# Patient Record
Sex: Female | Born: 1945 | Race: White | Hispanic: No | Marital: Single | Smoking: Former smoker
Health system: Southern US, Community
[De-identification: ages and names within clinical notes are randomized; demographics above are authoritative.]

## PROBLEM LIST (undated history)

## (undated) DIAGNOSIS — Z8679 Personal history of other diseases of the circulatory system: Secondary | ICD-10-CM

## (undated) DIAGNOSIS — F419 Anxiety disorder, unspecified: Secondary | ICD-10-CM

## (undated) DIAGNOSIS — E785 Hyperlipidemia, unspecified: Secondary | ICD-10-CM

## (undated) DIAGNOSIS — F329 Major depressive disorder, single episode, unspecified: Secondary | ICD-10-CM

## (undated) DIAGNOSIS — I480 Paroxysmal atrial fibrillation: Secondary | ICD-10-CM

## (undated) DIAGNOSIS — J189 Pneumonia, unspecified organism: Secondary | ICD-10-CM

## (undated) DIAGNOSIS — F32A Depression, unspecified: Secondary | ICD-10-CM

## (undated) DIAGNOSIS — Z86718 Personal history of other venous thrombosis and embolism: Secondary | ICD-10-CM

## (undated) DIAGNOSIS — M1990 Unspecified osteoarthritis, unspecified site: Secondary | ICD-10-CM

## (undated) DIAGNOSIS — C801 Malignant (primary) neoplasm, unspecified: Secondary | ICD-10-CM

## (undated) DIAGNOSIS — I119 Hypertensive heart disease without heart failure: Secondary | ICD-10-CM

## (undated) DIAGNOSIS — H269 Unspecified cataract: Secondary | ICD-10-CM

## (undated) DIAGNOSIS — I255 Ischemic cardiomyopathy: Secondary | ICD-10-CM

## (undated) DIAGNOSIS — G43909 Migraine, unspecified, not intractable, without status migrainosus: Secondary | ICD-10-CM

## (undated) DIAGNOSIS — I219 Acute myocardial infarction, unspecified: Secondary | ICD-10-CM

## (undated) DIAGNOSIS — Z955 Presence of coronary angioplasty implant and graft: Secondary | ICD-10-CM

## (undated) DIAGNOSIS — E559 Vitamin D deficiency, unspecified: Secondary | ICD-10-CM

## (undated) DIAGNOSIS — D649 Anemia, unspecified: Secondary | ICD-10-CM

## (undated) DIAGNOSIS — M2550 Pain in unspecified joint: Secondary | ICD-10-CM

## (undated) DIAGNOSIS — T8859XA Other complications of anesthesia, initial encounter: Secondary | ICD-10-CM

## (undated) DIAGNOSIS — I2510 Atherosclerotic heart disease of native coronary artery without angina pectoris: Secondary | ICD-10-CM

## (undated) DIAGNOSIS — Z9889 Other specified postprocedural states: Secondary | ICD-10-CM

## (undated) DIAGNOSIS — G4710 Hypersomnia, unspecified: Secondary | ICD-10-CM

## (undated) DIAGNOSIS — M51369 Other intervertebral disc degeneration, lumbar region without mention of lumbar back pain or lower extremity pain: Secondary | ICD-10-CM

## (undated) DIAGNOSIS — Z85828 Personal history of other malignant neoplasm of skin: Secondary | ICD-10-CM

## (undated) DIAGNOSIS — T884XXA Failed or difficult intubation, initial encounter: Secondary | ICD-10-CM

## (undated) DIAGNOSIS — M5136 Other intervertebral disc degeneration, lumbar region: Secondary | ICD-10-CM

## (undated) DIAGNOSIS — Z95810 Presence of automatic (implantable) cardiac defibrillator: Secondary | ICD-10-CM

## (undated) DIAGNOSIS — I252 Old myocardial infarction: Secondary | ICD-10-CM

## (undated) DIAGNOSIS — M1711 Unilateral primary osteoarthritis, right knee: Secondary | ICD-10-CM

## (undated) DIAGNOSIS — Z86711 Personal history of pulmonary embolism: Secondary | ICD-10-CM

## (undated) DIAGNOSIS — M1611 Unilateral primary osteoarthritis, right hip: Secondary | ICD-10-CM

## (undated) DIAGNOSIS — G4733 Obstructive sleep apnea (adult) (pediatric): Secondary | ICD-10-CM

## (undated) DIAGNOSIS — I10 Essential (primary) hypertension: Secondary | ICD-10-CM

## (undated) DIAGNOSIS — I5022 Chronic systolic (congestive) heart failure: Secondary | ICD-10-CM

## (undated) DIAGNOSIS — Z7901 Long term (current) use of anticoagulants: Secondary | ICD-10-CM

## (undated) HISTORY — DX: Vitamin D deficiency, unspecified: E55.9

## (undated) HISTORY — DX: Unspecified cataract: H26.9

## (undated) HISTORY — DX: Hyperlipidemia, unspecified: E78.5

## (undated) HISTORY — PX: TUBAL LIGATION: SHX77

## (undated) HISTORY — DX: Pain in unspecified joint: M25.50

## (undated) HISTORY — DX: Obstructive sleep apnea (adult) (pediatric): G47.33

## (undated) HISTORY — DX: Unspecified osteoarthritis, unspecified site: M19.90

## (undated) HISTORY — PX: CATARACT EXTRACTION, BILATERAL: SHX1313

## (undated) HISTORY — DX: Ischemic cardiomyopathy: I25.5

## (undated) HISTORY — PX: WISDOM TOOTH EXTRACTION: SHX21

## (undated) HISTORY — DX: Atherosclerotic heart disease of native coronary artery without angina pectoris: I25.10

## (undated) HISTORY — DX: Hypertensive heart disease without heart failure: I11.9

## (undated) HISTORY — PX: JOINT REPLACEMENT: SHX530

## (undated) HISTORY — PX: DILATION AND CURETTAGE OF UTERUS: SHX78

## (undated) HISTORY — PX: EYE SURGERY: SHX253

---

## 2004-05-19 ENCOUNTER — Ambulatory Visit: Payer: Self-pay | Admitting: Family Medicine

## 2004-07-12 ENCOUNTER — Ambulatory Visit: Payer: Self-pay | Admitting: Family Medicine

## 2004-07-18 ENCOUNTER — Ambulatory Visit: Payer: Self-pay | Admitting: Family Medicine

## 2004-08-17 ENCOUNTER — Ambulatory Visit: Payer: Self-pay | Admitting: Family Medicine

## 2005-07-10 HISTORY — PX: CARDIAC DEFIBRILLATOR PLACEMENT: SHX171

## 2006-04-11 ENCOUNTER — Encounter: Payer: Self-pay | Admitting: Internal Medicine

## 2006-05-24 ENCOUNTER — Encounter: Payer: Self-pay | Admitting: Internal Medicine

## 2006-07-16 ENCOUNTER — Encounter: Payer: Self-pay | Admitting: Internal Medicine

## 2009-01-15 DIAGNOSIS — I219 Acute myocardial infarction, unspecified: Secondary | ICD-10-CM | POA: Insufficient documentation

## 2009-01-15 DIAGNOSIS — E785 Hyperlipidemia, unspecified: Secondary | ICD-10-CM

## 2009-01-15 DIAGNOSIS — I2589 Other forms of chronic ischemic heart disease: Secondary | ICD-10-CM

## 2009-01-15 DIAGNOSIS — I255 Ischemic cardiomyopathy: Secondary | ICD-10-CM | POA: Insufficient documentation

## 2009-01-15 DIAGNOSIS — I472 Ventricular tachycardia: Secondary | ICD-10-CM

## 2009-01-20 ENCOUNTER — Ambulatory Visit: Payer: Self-pay | Admitting: Internal Medicine

## 2009-01-20 DIAGNOSIS — Z95810 Presence of automatic (implantable) cardiac defibrillator: Secondary | ICD-10-CM

## 2009-01-29 ENCOUNTER — Telehealth (INDEPENDENT_AMBULATORY_CARE_PROVIDER_SITE_OTHER): Payer: Self-pay | Admitting: *Deleted

## 2009-07-04 ENCOUNTER — Encounter: Payer: Self-pay | Admitting: Internal Medicine

## 2009-08-31 ENCOUNTER — Encounter: Payer: Self-pay | Admitting: Internal Medicine

## 2009-08-31 ENCOUNTER — Ambulatory Visit: Payer: Self-pay | Admitting: Internal Medicine

## 2009-11-30 ENCOUNTER — Ambulatory Visit: Payer: Self-pay | Admitting: Internal Medicine

## 2009-12-13 ENCOUNTER — Encounter: Payer: Self-pay | Admitting: Internal Medicine

## 2010-04-19 ENCOUNTER — Ambulatory Visit: Payer: Self-pay | Admitting: Internal Medicine

## 2010-07-10 DIAGNOSIS — Z86711 Personal history of pulmonary embolism: Secondary | ICD-10-CM

## 2010-07-10 HISTORY — DX: Personal history of pulmonary embolism: Z86.711

## 2010-07-21 ENCOUNTER — Encounter: Payer: Self-pay | Admitting: Internal Medicine

## 2010-07-21 ENCOUNTER — Ambulatory Visit
Admission: RE | Admit: 2010-07-21 | Discharge: 2010-07-21 | Payer: Self-pay | Source: Home / Self Care | Attending: Internal Medicine | Admitting: Internal Medicine

## 2010-08-09 NOTE — Assessment & Plan Note (Signed)
Summary: medtronicq   Visit Type:  Follow-up Primary Provider:  Benedetto Goad, MD   History of Present Illness: Ms. Folden returns today for followup.  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   She does not have c/p or sob or peripheral edema.  She has had no intercurrent ICD therapies.  She is saddened by the death of her husband who died only a few months ago.  Current Medications (verified): 1)  Carvedilol 25 Mg Tabs (Carvedilol) .... Take One Tablet By Mouth Twice A Day 2)  Ramipril 2.5 Mg Caps (Ramipril) .... Take One Capsule By Mouth Daily 3)  Paxil 20 Mg Tabs (Paroxetine Hcl) .Marland Kitchen.. 1 By Mouth Once Daily 4)  Topamax 100 Mg Tabs (Topiramate) .... Two Times A Day 5)  Simvastatin 40 Mg Tabs (Simvastatin) .... One By Mouth At Bedtime 6)  Naprosyn 500 Mg Tabs (Naproxen) .Marland Kitchen.. 1 By Mouth As Needed 7)  Vitamin E 600 Unit  Caps (Vitamin E) .Marland Kitchen.. 1 By Mouth Once Daily 8)  Stress Formula  Tabs (B Complex-C-Folic Acid) .Marland Kitchen.. 1 By Mouth Once Daily 9)  Aspirin 81 Mg Tbec (Aspirin) .... Take One Tablet By Mouth Daily 10)  Zolpidem Tartrate 10 Mg Tabs (Zolpidem Tartrate) .... At Bedtime 11)  Cyanocobalamin 2500 Mcg Subl (Cyanocobalamin) .... Once Daily 12)  Diphenhydramine Hcl 25 Mg Caps (Diphenhydramine Hcl) .... Once Daily 13)  Flax   Oil (Flaxseed (Linseed)) .... 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    Vital Signs:  Patient profile:   65 year old female Height:      67 inches Weight:      181 pounds Pulse rate:   70 / minute BP sitting:   100 / 70  (left arm)  Vitals Entered By: Laurance Flatten CMA (April 19, 2010  3:11 PM)  Physical Exam  General:  Well developed, well nourished, in no acute distress. Head:  normocephalic and atraumatic Eyes:  PERRLA/EOM intact; conjunctiva and lids normal. Mouth:  Teeth, gums and palate normal. Oral mucosa normal. Neck:  Neck supple, no JVD. No masses, thyromegaly or abnormal cervical nodes. Chest Wall:  Well healed ICD incision. Lungs:  Clear bilaterally with no wheezes, rales, or rhonchi. Heart:  RRR with normal S1 and S2.  PMI is not appreciably enlarged or laterally displaced.  No murmurs, rubs, or gallops. Abdomen:  Bowel sounds positive; abdomen soft and non-tender without masses, organomegaly, or hernias noted. No hepatosplenomegaly. Msk:  Back normal, normal gait. Muscle strength and tone normal. Pulses:  pulses normal in all 4 extremities Extremities:  No clubbing or cyanosis. Neurologic:  Alert and oriented x 3.    ICD Specifications Following MD:  Lewayne Bunting, MD     ICD Vendor:  Medtronic     ICD Model Number:  [id number]     ICD Serial Number:  [id number] H ICD DOI:  04/11/2006      Lead 1:    Location: RA     DOI: 04/11/2006     Model #: [id number]     Serial #: [id number]     Status: active Lead 2:    Location: RV     DOI: 04/27/2006  Model #: [id number]     Serial #: [id number]     Status: active  Indications::  VT Not implanted by [place]   ICD Follow Up Battery Voltage:  3.04 V     Charge Time:  9.7 seconds     Underlying rhythm:  SR ICD Dependent:  No       ICD Device Measurements Atrium:  Amplitude: 2.8 mV, Impedance: 624 ohms, Threshold: 1.0 V at 0.4 msec Right Ventricle:  Amplitude: 10.4 mV, Impedance: 368 ohms, Threshold: 1.0 V at 0.4 msec Shock Impedance: 56/70 ohms   Episodes MS Episodes:  0     Coumadin:  No Shock:  0     ATP:  0     Nonsustained:  0     Atrial Therapies:  0 Atrial Pacing:  3.4%     Ventricular Pacing:  <0.2%  Brady Parameters Mode MVP     Lower Rate Limit:  60     Upper Rate Limit 120 PAV 180     Sensed AV Delay:   150  Tachy Zones VF:  188     VT:  162     Next Remote Date:  07/21/2010     Next Cardiology Appt Due:  04/10/2011 Tech Comments:  OPTIVOL INCREASE FROM 7-26 THRU 8-02.  NO EPISODES SINCE LAST CHECK.  NORMAL DEVICE FUNCTION.  CHANGED MAX LEAD IMPEDANCES FOR LIA AND CHANGED RV OUTPUT FROM 2.0 TO 2.5 V. CARELINK TRANSMISSION 07-21-10 AND ROV IN 12 MTHS W/GT. Vella Kohler  April 19, 2010 3:24 PM MD Comments:  Agree with above.  Impression & Recommendations:  Problem # 1:  AUTOMATIC IMPLANTABLE CARDIAC DEFIBRILLATOR SITU (ICD-V45.02) Her device is working normally.  Will recheck in several months.  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  She will continue her meds as below and maintain a low sodium diet and regular daily exercise. Her updated medication list for this problem includes:    Carvedilol 25 Mg Tabs (Carvedilol) .Marland Kitchen... Take one tablet by mouth twice a day    Ramipril 2.5 Mg Caps (Ramipril) .Marland Kitchen... Take one capsule by mouth daily    Aspirin 81 Mg Tbec (Aspirin) .Marland Kitchen... Take one tablet by mouth daily  Problem # 3:  DYSLIPIDEMIA (ICD-272.4) She will continue her statin and maintain a low sodium diet. Her updated medication list for this problem includes:    Simvastatin 40 Mg Tabs (Simvastatin) ..... One by mouth at bedtime  Patient Instructions: 1)  Your physician wants you to follow-up in: 12 months with Dr Court Joy will receive a reminder letter in the mail two months in advance. If you don't receive a letter, please call our office to schedule the follow-up appointment. 2)  [facility] 07/21/10

## 2010-08-09 NOTE — Letter (Signed)
Summary: Remote Device Check  [facility], Main Office  [address]   [place], [place] [address]   Phone: [phone]  Fax: [phone]     December 13, 2009 MRN: [id number]   Rachel Bates [address] [place], [place]  [address]   Dear [name],   Your remote transmission was recieved and reviewed by your physician.  All diagnostics were within normal limits for you.   __X____Your next office visit is scheduled for:  August 2011. Please call our office to schedule an appointment.    Sincerely,  Vella Kohler

## 2010-08-09 NOTE — Cardiovascular Report (Signed)
Summary: Office Visit   Office Visit   Imported By: Roderic Ovens 04/22/2010 11:42:45  _____________________________________________________________________  External Attachment:    Type:   Image     Comment:   External Document

## 2010-08-09 NOTE — Assessment & Plan Note (Signed)
Summary: [facility]   Visit Type:  Follow-up Primary Provider:  Benedetto Goad, MD   History of Present Illness: Rachel Bates returns today for followup.  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  She was doing well and it was my sense that ICD implant was not indicated as she had been referred for consideration for prophylactic implant.  She moved back to [place] and ultimately underwent insertion of a medtronic device.  She has subsequently moved back to [place] and is here today for ICD followup.  She does not have c/p or sob or peripheral edema.  She has had no intercurrent ICD therapies.  She is saddened by the death of her husband who died only a few days ago.  Current Medications (verified): 1)  Carvedilol 25 Mg Tabs (Carvedilol) .... Take One Tablet By Mouth Twice A Day 2)  Ramipril 2.5 Mg Caps (Ramipril) .... Take One Capsule By Mouth Daily 3)  Paxil 20 Mg Tabs (Paroxetine Hcl) .Marland Kitchen.. 1 By Mouth Once Daily 4)  Topamax 100 Mg Tabs (Topiramate) .... Two Times A Day 5)  Lipitor 20 Mg Tabs (Atorvastatin Calcium) .... Take One Tablet By Mouth Daily. 6)  Naprosyn 500 Mg Tabs (Naproxen) .Marland Kitchen.. 1 By Mouth As Needed 7)  Calcium Carbonate   Powd (Calcium Carbonate) .Marland Kitchen.. 1 By Mouth Two Times A Day 8)  Vitamin E 600 Unit  Caps (Vitamin E) .Marland Kitchen.. 1 By Mouth Once Daily 9)  Vitamin D 400 Unit  Tabs (Cholecalciferol) .Marland Kitchen.. 1 By Mouth Once Daily 10)  Stress Formula  Tabs (B Complex-C-Folic Acid) .Marland Kitchen.. 1 By Mouth Once Daily 11)  Aspirin 81 Mg Tbec (Aspirin) .... 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      Vital Signs:  Patient profile:   65 year old female Height:      67 inches Weight:      184 pounds BMI:     28.92 Pulse rate:   65 / minute BP sitting:   132 / 82  (left arm)  Vitals Entered By: Laurance Flatten CMA (August 31, 2009 12:43 PM)  Physical Exam  General:  Well developed, well nourished, in no acute distress. Head:  normocephalic and atraumatic Eyes:  PERRLA/EOM intact; conjunctiva and lids normal. Mouth:  Teeth, gums and palate normal. Oral mucosa normal. Neck:  Neck supple, no JVD. No masses, thyromegaly or abnormal cervical nodes. Chest Wall:  Well healed ICD incision. Lungs:  Clear bilaterally with no wheezes, rales, or rhonchi. Heart:  RRR with normal S1 and S2.  PMI is not appreciably enlarged or laterally displaced.  No murmurs, rubs, or gallops. Abdomen:  Bowel sounds positive; abdomen soft and non-tender without masses, organomegaly, or hernias noted. No hepatosplenomegaly. Msk:  Back normal, normal gait. Muscle strength and tone normal. Pulses:  pulses normal in all 4 extremities Extremities:  No clubbing or cyanosis. Neurologic:  Alert and oriented x 3.    ICD Specifications Following MD:  Lewayne Bunting, MD     ICD Vendor:  Medtronic     ICD Model Number:  [id number]     ICD Serial Number:  [id number] H ICD DOI:  04/11/2006  Lead 1:    Location: RA     DOI: 04/11/2006     Model #: 2725     Serial #: [id number]     Status: active Lead 2:    Location: RV     DOI: 04/27/2006     Model #: [id number]     Serial #: [id number]     Status: active  Indications::  VT Not implanted by [place]   ICD Follow Up Remote Check?  No Battery Voltage:  3.09 V     Charge Time:  9.6 seconds     ICD Dependent:  No       ICD Device Measurements Atrium:  Amplitude: 3.2 mV, Impedance: 640 ohms, Threshold: 1.0 V at 0.4 msec Right Ventricle:  Amplitude: 8.7 mV, Impedance: 376 ohms, Threshold: 1.0 V at 0.4 msec Shock Impedance: 51/63 ohms   Episodes MS Episodes:  0     Percent Mode  Switch:  0     Coumadin:  No Shock:  0     ATP:  0     Nonsustained:  0     Atrial Pacing:  4.4%     Ventricular Pacing:  0%  Brady Parameters Mode DDDR+     Lower Rate Limit:  60     Upper Rate Limit 120 PAV 180     Sensed AV Delay:  150  Tachy Zones VF:  188     VT:  162     Next Remote Date:  11/30/2009     Next Cardiology Appt Due:  08/10/2010 Tech Comments:  No parameter changes.  Checked by [employer].   [facility] transmissions every 3 months .  ROV 1 year Dr. Ladona Ridgel. Altha Harm, LPN  August 31, 2009 12:52 PM  MD Comments:  Agree with above.  Impression & Recommendations:  Problem # 1:  AUTOMATIC IMPLANTABLE CARDIAC DEFIBRILLATOR SITU (ICD-V45.02) The device is working normally.  Will recheck in several months.  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  Continue current meds. Her updated medication list for this problem includes:    Carvedilol 25 Mg Tabs (Carvedilol) .Marland Kitchen... Take one tablet by mouth twice a day    Ramipril 2.5 Mg Caps (Ramipril) .Marland Kitchen... Take one capsule by mouth daily    Aspirin 81 Mg Tbec (Aspirin) .Marland Kitchen... Take one tablet by mouth daily  Problem # 3:  DYSLIPIDEMIA (ICD-272.4) A low fat diet is recommended. Her updated medication list for this problem includes:    Simvastatin 40 Mg Tabs (Simvastatin) ..... One by mouth at bedtime  Patient Instructions: 1)  Your physician recommends that you schedule a follow-up appointment in: 6 months with Dr Ladona Ridgel 2)  Your physician has recommended you make the following change in your medication: change cholesterol pill to Simvastin 40mg  daily Prescriptions: SIMVASTATIN 40 MG TABS (SIMVASTATIN) one by mouth at bedtime  #90 x 3   Entered by:   Dennis Bast, RN, BSN   Authorized by:   Laren Boom, MD, [facility]   Signed by:   Dennis Bast, RN, BSN on 08/31/2009   Method used:   Electronically to        [facility]  [address] [phone]* (retail)       [address]       [place], [place]  [address]       Ph: [phone] or [phone]       Fax: [phone]  RxID:   [id number]

## 2010-08-09 NOTE — Assessment & Plan Note (Signed)
Summary: MEDTRONIC   Allergies: No Known Drug Allergies    ICD Specifications Following MD:  Lewayne Bunting, MD     ICD Vendor:  Medtronic     ICD Model Number:  [id number]     ICD Serial Number:  [id number] H ICD DOI:  04/11/2006      Lead 1:    Location: RA     DOI: 04/11/2006     Model #: 5076     Serial #: [id number]     Status: active Lead 2:    Location: RV     DOI: 04/27/2006     Model #: 6295     Serial #: [id number]     Status: active  Indications::  VT Not implanted by [place]   ICD Follow Up ICD Dependent:  No      Episodes Coumadin:  No  Brady Parameters Mode DDDR+     Lower Rate Limit:  60     Upper Rate Limit 120 PAV 180     Sensed AV Delay:  150  Tachy Zones VF:  188     VT:  162     Patient Instructions: 1)  Your physician recommends that you schedule a follow-up appointment in: 6 months with Dr Ladona Ridgel 2)  Your physician has recommended you make the following change in your medication: change cholesterol pill to Simvastin

## 2010-08-09 NOTE — Cardiovascular Report (Signed)
Summary: Office Visit Remote   Office Visit Remote   Imported By: Roderic Ovens 12/23/2009 16:12:01  _____________________________________________________________________  External Attachment:    Type:   Image     Comment:   External Document

## 2010-08-21 ENCOUNTER — Encounter (INDEPENDENT_AMBULATORY_CARE_PROVIDER_SITE_OTHER): Payer: Self-pay | Admitting: *Deleted

## 2010-08-31 NOTE — Letter (Signed)
Summary: Remote Device Check  [facility], Main Office  [address]   [place], [place] [address]   Phone: [phone]  Fax: [phone]     August 21, 2010 MRN: [id number]   JAYNA MULNIX [address] [place], [place]  [address]   Dear Ms. Delprado,   Your remote transmission was recieved and reviewed by your physician.  All diagnostics were within normal limits for you.  __X___Your next transmission is scheduled for:  10-20-2010.  Please transmit at any time this day.  If you have a wireless device your transmission will be sent automatically.  Sincerely,  Vella Kohler

## 2010-08-31 NOTE — Cardiovascular Report (Signed)
Summary: Office Visit Remote   Office Visit Remote   Imported By: Roderic Ovens 08/23/2010 15:10:14  _____________________________________________________________________  External Attachment:    Type:   Image     Comment:   External Document

## 2010-10-20 ENCOUNTER — Encounter: Payer: Self-pay | Admitting: *Deleted

## 2010-10-25 ENCOUNTER — Encounter: Payer: Self-pay | Admitting: *Deleted

## 2010-12-29 ENCOUNTER — Encounter: Payer: Self-pay | Admitting: Internal Medicine

## 2010-12-30 ENCOUNTER — Ambulatory Visit (INDEPENDENT_AMBULATORY_CARE_PROVIDER_SITE_OTHER): Payer: Medicare Other | Admitting: Internal Medicine

## 2010-12-30 ENCOUNTER — Encounter: Payer: Self-pay | Admitting: Internal Medicine

## 2010-12-30 VITALS — BP 105/67 | HR 76 | Resp 18 | Ht 67.0 in | Wt 181.8 lb

## 2010-12-30 DIAGNOSIS — I2589 Other forms of chronic ischemic heart disease: Secondary | ICD-10-CM

## 2010-12-30 DIAGNOSIS — I5022 Chronic systolic (congestive) heart failure: Secondary | ICD-10-CM

## 2010-12-30 DIAGNOSIS — Z95810 Presence of automatic (implantable) cardiac defibrillator: Secondary | ICD-10-CM

## 2010-12-30 DIAGNOSIS — I2699 Other pulmonary embolism without acute cor pulmonale: Secondary | ICD-10-CM | POA: Insufficient documentation

## 2010-12-30 DIAGNOSIS — I472 Ventricular tachycardia: Secondary | ICD-10-CM

## 2010-12-30 NOTE — Assessment & Plan Note (Signed)
Her device is working normally. Will recheck in several months. 

## 2010-12-30 NOTE — Assessment & Plan Note (Signed)
Her symptoms have resolved. She will continue on coumadin for several months.

## 2010-12-30 NOTE — Assessment & Plan Note (Signed)
She denies anginal symptoms. She will continue her current meds.  

## 2010-12-30 NOTE — Patient Instructions (Addendum)
Your physician recommends that you schedule a follow-up appointment in: 3 months with Dr Taylor  

## 2010-12-30 NOTE — Progress Notes (Signed)
HPI Rachel Bates returns today for followup. She is a pleasant middle aged woman with a h/o CAD, CHF, HTN, s/p ICD. She has recently been diagnosed with a pulmonary embolism and is on coumadin. She denies c/p, sob or peripheral edema at least since she was discharged from the hospital with her PE.  No Known Allergies   Current Outpatient Prescriptions  Medication Sig Dispense Refill  . aspirin 81 MG tablet Take 81 mg by mouth daily.        . B Complex-C-Folic Acid (STRESS FORMULA) TABS Take 1 tablet by mouth daily.        . carvedilol (COREG) 25 MG tablet Take 25 mg by mouth 2 (two) times daily with a meal.        . Cyanocobalamin 2500 MCG TABS Take 1 tablet by mouth daily.        . Omega-3 Fatty Acids (FISH OIL) 1000 MG CAPS Take 1 capsule by mouth daily.        Marland Kitchen PARoxetine (PAXIL) 20 MG tablet Take 40 mg by mouth every morning.       . ramipril (ALTACE) 2.5 MG tablet Take 2.5 mg by mouth daily.        . simvastatin (ZOCOR) 40 MG tablet Take 40 mg by mouth at bedtime.        . topiramate (TOPAMAX) 100 MG tablet Take 100 mg by mouth 2 (two) times daily.        . vitamin E 600 UNIT capsule Take 600 Units by mouth daily.        Marland Kitchen warfarin (COUMADIN) 7.5 MG tablet Take 7.5 mg by mouth daily. AS DIRECTED       . zolpidem (AMBIEN) 10 MG tablet Take 10 mg by mouth at bedtime as needed.        Marland Kitchen DISCONTD: diphenhydrAMINE (SOMINEX) 25 MG tablet Take 25 mg by mouth daily.        Marland Kitchen DISCONTD: Judie Petit Prim-Borage (FLAX OIL XTRA) CAPS Take 1 capsule by mouth daily.        Marland Kitchen DISCONTD: naproxen (NAPROSYN) 500 MG tablet Take 500 mg by mouth daily as needed.           Past Medical History  Diagnosis Date  . Dyslipidemia   . Ventricular tachycardia   . Myocardial infarct   . Ischemic cardiomyopathy     ROS:   All systems reviewed and negative except as noted in the HPI.   No past surgical history on file.   Family History  Problem Relation Age of Onset  . Hypertension Mother   .  Thyroid disease Mother   . Coronary artery disease Father      History   Social History  . Marital Status: Married    Spouse Name: N/A    Number of Children: N/A  . Years of Education: N/A   Occupational History  . Not on file.   Social History Main Topics  . Smoking status: Former Games developer  . Smokeless tobacco: Not on file  . Alcohol Use: No  . Drug Use: No  . Sexually Active: Not on file   Other Topics Concern  . Not on file   Social History Narrative  . No narrative on file     BP 105/67  Pulse 76  Resp 18  Ht 5\' 7"  (1.702 m)  Wt 181 lb 12.8 oz (82.464 kg)  BMI 28.47 kg/m2  Physical Exam:  Well appearing NAD HEENT: Unremarkable Neck:  No JVD,  no thyromegally Lymphatics:  No adenopathy Back:  No CVA tenderness Lungs:  Clear with no wheezes HEART:  Regular rate rhythm, no murmurs, no rubs, no clicks Abd:  Flat, positive bowel sounds, no organomegally, no rebound, no guarding Ext:  2 plus pulses, trace edema, no cyanosis, no clubbing Skin:  No rashes no nodules Neuro:  CN II through XII intact, motor grossly intact  DEVICE  Normal device function.  See PaceArt for details.   Assess/Plan:

## 2011-01-02 ENCOUNTER — Telehealth: Payer: Self-pay | Admitting: Internal Medicine

## 2011-01-02 NOTE — Telephone Encounter (Signed)
Called and left message for patient to call me back.

## 2011-01-02 NOTE — Telephone Encounter (Signed)
Pt was here Friday and was to get a Rx called in for lasix and it was not sent pls call into [facility] in [place] [phone]

## 2011-01-03 MED ORDER — FUROSEMIDE 20 MG PO TABS: 20.0000 mg | ORAL_TABLET | Freq: Every day | ORAL | Status: DC

## 2011-01-03 NOTE — Telephone Encounter (Signed)
lmom for pt that Dr Ladona Ridgel said ok for Lasix 20mg  daily

## 2011-03-30 ENCOUNTER — Ambulatory Visit (INDEPENDENT_AMBULATORY_CARE_PROVIDER_SITE_OTHER): Payer: Medicare Other | Admitting: Internal Medicine

## 2011-03-30 ENCOUNTER — Encounter: Payer: Self-pay | Admitting: Internal Medicine

## 2011-03-30 DIAGNOSIS — Z95810 Presence of automatic (implantable) cardiac defibrillator: Secondary | ICD-10-CM

## 2011-03-30 DIAGNOSIS — I2589 Other forms of chronic ischemic heart disease: Secondary | ICD-10-CM

## 2011-03-30 DIAGNOSIS — I2699 Other pulmonary embolism without acute cor pulmonale: Secondary | ICD-10-CM

## 2011-03-30 DIAGNOSIS — I509 Heart failure, unspecified: Secondary | ICD-10-CM

## 2011-03-30 DIAGNOSIS — I472 Ventricular tachycardia, unspecified: Secondary | ICD-10-CM

## 2011-03-30 LAB — ICD DEVICE OBSERVATION
AL AMPLITUDE: 2.7 mv
ATRIAL PACING ICD: 2.61 pct
BAMS-0001: 165 {beats}/min
DEV-0020ICD: NEGATIVE
FVT: 0
RV LEAD THRESHOLD: 1 V
TZAT-0001ATACH: 2
TZAT-0001ATACH: 3
TZAT-0002ATACH: NEGATIVE
TZAT-0002ATACH: NEGATIVE
TZAT-0002ATACH: NEGATIVE
TZAT-0004SLOWVT: 8
TZAT-0005SLOWVT: 81 pct
TZAT-0011SLOWVT: 10 ms
TZAT-0012ATACH: 150 ms
TZAT-0012FASTVT: 200 ms
TZAT-0012SLOWVT: 200 ms
TZAT-0018ATACH: NEGATIVE
TZAT-0019ATACH: 6 V
TZAT-0019ATACH: 6 V
TZAT-0019ATACH: 6 V
TZAT-0020FASTVT: 1.5 ms
TZON-0003SLOWVT: 370 ms
TZON-0004VSLOWVT: 20
TZON-0008SLOWVT: 180000 ms
TZON-0008VSLOWVT: 180000 ms
TZON-0010FASTVT: 50 ms
TZST-0001ATACH: 5
TZST-0001ATACH: 6
TZST-0001FASTVT: 2
TZST-0001FASTVT: 6
TZST-0001SLOWVT: 3
TZST-0002ATACH: NEGATIVE
TZST-0002ATACH: NEGATIVE
TZST-0002ATACH: NEGATIVE
TZST-0002FASTVT: NEGATIVE
TZST-0002FASTVT: NEGATIVE
TZST-0002FASTVT: NEGATIVE
TZST-0003SLOWVT: 20 J
TZST-0003SLOWVT: 35 J
TZST-0003SLOWVT: 35 J
VENTRICULAR PACING ICD: 0.03 pct
VF: 0

## 2011-03-30 NOTE — Assessment & Plan Note (Signed)
She remains active. She denies anginal symptoms. She will continue her current medical therapy.

## 2011-03-30 NOTE — Progress Notes (Signed)
HPI Rachel Bates returns today for followup. She is a 64 year old woman with a history of ischemic cardiomyopathy and chronic class II congestive heart failure. She has a history of pulmonary embolism and has been on warfarin therapy. She is status post prophylactic ICD implantation. She denies any ICD shocks. She is scheduled to undergo extraction of her upper teeth. No other complaints today.  No Known Allergies   Current Outpatient Prescriptions  Medication Sig Dispense Refill  . aspirin 81 MG tablet Take 81 mg by mouth daily.        . B Complex-C-Folic Acid (STRESS FORMULA) TABS Take 1 tablet by mouth daily.        . carvedilol (COREG) 25 MG tablet Take 25 mg by mouth 2 (two) times daily with a meal.        . cholecalciferol (VITAMIN D) 1000 UNITS tablet Take 1,000 Units by mouth daily.        . Cyanocobalamin 2500 MCG TABS Take 1 tablet by mouth daily.        . furosemide (LASIX) 20 MG tablet Take 20 mg by mouth as needed.        . methocarbamol (ROBAXIN) 750 MG tablet Take 750 mg by mouth as needed.        . Omega-3 Fatty Acids (FISH OIL) 1000 MG CAPS Take 1 capsule by mouth daily.        Marland Kitchen PARoxetine (PAXIL) 40 MG tablet Take 40 mg by mouth every morning.        . ramipril (ALTACE) 2.5 MG tablet Take 2.5 mg by mouth daily.        . simvastatin (ZOCOR) 40 MG tablet Take 40 mg by mouth at bedtime.        . topiramate (TOPAMAX) 100 MG tablet Take 100 mg by mouth 2 (two) times daily.        . traMADol (ULTRAM) 50 MG tablet Take 50 mg by mouth every 6 (six) hours as needed.        . vitamin E 600 UNIT capsule Take 600 Units by mouth daily.        Marland Kitchen warfarin (COUMADIN) 7.5 MG tablet Take 7.5 mg by mouth daily. AS DIRECTED       . zolpidem (AMBIEN) 10 MG tablet Take 10 mg by mouth at bedtime as needed.           Past Medical History  Diagnosis Date  . Dyslipidemia   . Ventricular tachycardia   . Myocardial infarct   . Ischemic cardiomyopathy     ROS:   All systems reviewed and  negative except as noted in the HPI.   No past surgical history on file.   Family History  Problem Relation Age of Onset  . Hypertension Mother   . Thyroid disease Mother   . Coronary artery disease Father      History   Social History  . Marital Status: Married    Spouse Name: N/A    Number of Children: N/A  . Years of Education: N/A   Occupational History  . Not on file.   Social History Main Topics  . Smoking status: Former Games developer  . Smokeless tobacco: Not on file  . Alcohol Use: No  . Drug Use: No  . Sexually Active: Not on file   Other Topics Concern  . Not on file   Social History Narrative  . No narrative on file     BP 104/60  Pulse 73  Ht  5\' 7"  (1.702 m)  Wt 186 lb (84.369 kg)  BMI 29.13 kg/m2  Physical Exam:  Well appearing NAD HEENT: Unremarkable Neck:  No JVD, no thyromegally Lymphatics:  No adenopathy Back:  No CVA tenderness Lungs:  Clear with no wheezes, rales, or rhonchi. HEART:  Regular rate rhythm, no murmurs, no rubs, no clicks Abd:  soft, positive bowel sounds, no organomegally, no rebound, no guarding Ext:  2 plus pulses, no edema, no cyanosis, no clubbing Skin:  No rashes no nodules Neuro:  CN II through XII intact, motor grossly intact  DEVICE  Normal device function.  See PaceArt for details.   Assess/Plan:

## 2011-03-30 NOTE — Patient Instructions (Signed)
Your physician wants you to follow-up in: 12 months with Dr Taylor You will receive a reminder letter in the mail two months in advance. If you don't receive a letter, please call our office to schedule the follow-up appointment.  Remote monitoring is used to monitor your Pacemaker of ICD from home. This monitoring reduces the number of office visits required to check your device to one time per year. It allows us to keep an eye on the functioning of your device to ensure it is working properly. You are scheduled for a device check from home on 06/29/11. You may send your transmission at any time that day. If you have a wireless device, the transmission will be sent automatically. After your physician reviews your transmission, you will receive a postcard with your next transmission date.    

## 2011-03-30 NOTE — Assessment & Plan Note (Signed)
Her device is working normally. Will recheck in several months. 

## 2011-03-30 NOTE — Assessment & Plan Note (Signed)
She Will continue Coumadin for now. The patient is pending dental work in several weeks. I recommended that she stop her Coumadin for 3-5 days prior to her dental procedure and then started back the day after the procedure. Overall she will likely require 6 total months of Coumadin therapy which would require her to take Coumadin through November.

## 2011-06-29 ENCOUNTER — Encounter: Payer: PRIVATE HEALTH INSURANCE | Admitting: *Deleted

## 2011-07-05 ENCOUNTER — Encounter: Payer: Self-pay | Admitting: Internal Medicine

## 2011-07-05 ENCOUNTER — Other Ambulatory Visit: Payer: Self-pay | Admitting: Internal Medicine

## 2011-07-05 ENCOUNTER — Ambulatory Visit (INDEPENDENT_AMBULATORY_CARE_PROVIDER_SITE_OTHER): Payer: Medicare Other | Admitting: *Deleted

## 2011-07-05 DIAGNOSIS — Z95810 Presence of automatic (implantable) cardiac defibrillator: Secondary | ICD-10-CM

## 2011-07-05 DIAGNOSIS — I472 Ventricular tachycardia: Secondary | ICD-10-CM

## 2011-07-07 LAB — REMOTE ICD DEVICE
AL AMPLITUDE: 3.2 mv
AL IMPEDENCE ICD: 528 Ohm
BAMS-0001: 165 {beats}/min
BATTERY VOLTAGE: 2.96 V
CHARGE TIME: 10.159 s
DEV-0020ICD: NEGATIVE
RV LEAD AMPLITUDE: 8.7 mv
RV LEAD IMPEDENCE ICD: 376 Ohm
TOT-0001: 10
TOT-0002: 0
TOT-0006: 20071019000000
TZAT-0001ATACH: 1
TZAT-0002ATACH: NEGATIVE
TZAT-0002ATACH: NEGATIVE
TZAT-0002ATACH: NEGATIVE
TZAT-0004SLOWVT: 8
TZAT-0011SLOWVT: 10 ms
TZAT-0012ATACH: 150 ms
TZAT-0012FASTVT: 200 ms
TZAT-0018ATACH: NEGATIVE
TZAT-0018ATACH: NEGATIVE
TZAT-0018FASTVT: NEGATIVE
TZAT-0019ATACH: 6 V
TZAT-0019ATACH: 6 V
TZAT-0019SLOWVT: 8 V
TZAT-0020ATACH: 1.5 ms
TZAT-0020FASTVT: 1.5 ms
TZAT-0020SLOWVT: 1.5 ms
TZON-0003SLOWVT: 370 ms
TZON-0008SLOWVT: 180000 ms
TZON-0008VSLOWVT: 180000 ms
TZON-0010FASTVT: 50 ms
TZON-0010SLOWVT: 50 ms
TZST-0001ATACH: 5
TZST-0001FASTVT: 2
TZST-0001FASTVT: 5
TZST-0001FASTVT: 6
TZST-0001SLOWVT: 4
TZST-0001SLOWVT: 6
TZST-0002ATACH: NEGATIVE
TZST-0002ATACH: NEGATIVE
TZST-0002FASTVT: NEGATIVE
TZST-0002FASTVT: NEGATIVE
TZST-0003SLOWVT: 20 J
TZST-0003SLOWVT: 35 J
TZST-0003SLOWVT: 35 J

## 2011-07-13 NOTE — Progress Notes (Signed)
Remote icd check  

## 2011-07-26 ENCOUNTER — Encounter: Payer: Self-pay | Admitting: *Deleted

## 2011-10-05 ENCOUNTER — Ambulatory Visit (INDEPENDENT_AMBULATORY_CARE_PROVIDER_SITE_OTHER): Payer: Medicare Other | Admitting: *Deleted

## 2011-10-05 DIAGNOSIS — I472 Ventricular tachycardia, unspecified: Secondary | ICD-10-CM

## 2011-10-05 DIAGNOSIS — I509 Heart failure, unspecified: Secondary | ICD-10-CM

## 2011-10-05 DIAGNOSIS — Z95810 Presence of automatic (implantable) cardiac defibrillator: Secondary | ICD-10-CM

## 2011-10-09 ENCOUNTER — Encounter: Payer: Self-pay | Admitting: Internal Medicine

## 2011-10-10 LAB — REMOTE ICD DEVICE
CHARGE TIME: 10.159 s
DEV-0020ICD: NEGATIVE
PACEART VT: 0
TOT-0001: 10
TOT-0002: 0
TZAT-0001ATACH: 1
TZAT-0001ATACH: 2
TZAT-0001FASTVT: 1
TZAT-0002ATACH: NEGATIVE
TZAT-0002ATACH: NEGATIVE
TZAT-0012ATACH: 150 ms
TZAT-0012FASTVT: 200 ms
TZAT-0012SLOWVT: 200 ms
TZAT-0018ATACH: NEGATIVE
TZAT-0018ATACH: NEGATIVE
TZAT-0019ATACH: 6 V
TZAT-0019ATACH: 6 V
TZAT-0019SLOWVT: 8 V
TZAT-0020ATACH: 1.5 ms
TZAT-0020SLOWVT: 1.5 ms
TZON-0003ATACH: 370 ms
TZON-0003SLOWVT: 370 ms
TZON-0008SLOWVT: 180000 ms
TZON-0008VSLOWVT: 180000 ms
TZON-0010FASTVT: 50 ms
TZON-0010SLOWVT: 50 ms
TZST-0001ATACH: 5
TZST-0001FASTVT: 4
TZST-0001FASTVT: 5
TZST-0001SLOWVT: 4
TZST-0001SLOWVT: 6
TZST-0002ATACH: NEGATIVE
TZST-0002FASTVT: NEGATIVE
TZST-0002FASTVT: NEGATIVE
TZST-0003SLOWVT: 20 J
TZST-0003SLOWVT: 35 J
VENTRICULAR PACING ICD: 0.01 pct

## 2011-10-11 NOTE — Progress Notes (Signed)
Remote icd check w/icm  

## 2011-10-18 ENCOUNTER — Encounter: Payer: Self-pay | Admitting: *Deleted

## 2012-01-11 ENCOUNTER — Encounter: Payer: Self-pay | Admitting: Internal Medicine

## 2012-01-12 ENCOUNTER — Ambulatory Visit (INDEPENDENT_AMBULATORY_CARE_PROVIDER_SITE_OTHER): Payer: Medicare Other | Admitting: *Deleted

## 2012-01-12 DIAGNOSIS — Z95810 Presence of automatic (implantable) cardiac defibrillator: Secondary | ICD-10-CM

## 2012-01-12 DIAGNOSIS — I2589 Other forms of chronic ischemic heart disease: Secondary | ICD-10-CM

## 2012-01-12 LAB — REMOTE ICD DEVICE
BAMS-0001: 165 {beats}/min
BATTERY VOLTAGE: 2.83 V
CHARGE TIME: 10.63 s
DEV-0020ICD: NEGATIVE
FVT: 0
PACEART VT: 0
RV LEAD AMPLITUDE: 9.0374 mv
TOT-0002: 0
TZAT-0001ATACH: 1
TZAT-0001ATACH: 2
TZAT-0001FASTVT: 1
TZAT-0002ATACH: NEGATIVE
TZAT-0002ATACH: NEGATIVE
TZAT-0011SLOWVT: 10 ms
TZAT-0012ATACH: 150 ms
TZAT-0012FASTVT: 200 ms
TZAT-0012SLOWVT: 200 ms
TZAT-0013SLOWVT: 3
TZAT-0018ATACH: NEGATIVE
TZAT-0018ATACH: NEGATIVE
TZAT-0019ATACH: 6 V
TZAT-0019ATACH: 6 V
TZAT-0019SLOWVT: 8 V
TZAT-0020FASTVT: 1.5 ms
TZAT-0020SLOWVT: 1.5 ms
TZON-0003SLOWVT: 370 ms
TZON-0004SLOWVT: 16
TZON-0004VSLOWVT: 20
TZON-0005SLOWVT: 12
TZON-0008FASTVT: 180000 ms
TZON-0008SLOWVT: 180000 ms
TZON-0008VSLOWVT: 180000 ms
TZON-0010FASTVT: 50 ms
TZON-0010SLOWVT: 50 ms
TZST-0001FASTVT: 2
TZST-0001FASTVT: 3
TZST-0001FASTVT: 4
TZST-0001SLOWVT: 3
TZST-0001SLOWVT: 6
TZST-0002ATACH: NEGATIVE
TZST-0002FASTVT: NEGATIVE
TZST-0002FASTVT: NEGATIVE
TZST-0003SLOWVT: 20 J
TZST-0003SLOWVT: 35 J
TZST-0003SLOWVT: 35 J
VENTRICULAR PACING ICD: 1.96 pct

## 2012-02-02 ENCOUNTER — Encounter: Payer: Self-pay | Admitting: *Deleted

## 2012-02-05 ENCOUNTER — Encounter: Payer: Self-pay | Admitting: Internal Medicine

## 2012-03-26 ENCOUNTER — Encounter: Payer: Self-pay | Admitting: Internal Medicine

## 2012-03-26 ENCOUNTER — Ambulatory Visit (INDEPENDENT_AMBULATORY_CARE_PROVIDER_SITE_OTHER): Payer: Medicare Other | Admitting: Internal Medicine

## 2012-03-26 VITALS — BP 98/68 | HR 67 | Ht 67.0 in | Wt 186.0 lb

## 2012-03-26 DIAGNOSIS — I472 Ventricular tachycardia: Secondary | ICD-10-CM

## 2012-03-26 DIAGNOSIS — I509 Heart failure, unspecified: Secondary | ICD-10-CM | POA: Insufficient documentation

## 2012-03-26 DIAGNOSIS — E7800 Pure hypercholesterolemia, unspecified: Secondary | ICD-10-CM

## 2012-03-26 LAB — ICD DEVICE OBSERVATION
AL AMPLITUDE: 2.3 mv
AL IMPEDENCE ICD: 608 Ohm
ATRIAL PACING ICD: 9.76 pct
CHARGE TIME: 10.63 s
DEV-0020ICD: NEGATIVE
RV LEAD IMPEDENCE ICD: 376 Ohm
TOT-0001: 10
TOT-0002: 0
TOT-0006: 20071019000000
TZAT-0001ATACH: 1
TZAT-0001ATACH: 2
TZAT-0001FASTVT: 1
TZAT-0002ATACH: NEGATIVE
TZAT-0002ATACH: NEGATIVE
TZAT-0002FASTVT: NEGATIVE
TZAT-0011SLOWVT: 10 ms
TZAT-0012ATACH: 150 ms
TZAT-0012FASTVT: 200 ms
TZAT-0012SLOWVT: 200 ms
TZAT-0018ATACH: NEGATIVE
TZAT-0018ATACH: NEGATIVE
TZAT-0018ATACH: NEGATIVE
TZAT-0019ATACH: 6 V
TZAT-0019SLOWVT: 8 V
TZAT-0020ATACH: 1.5 ms
TZAT-0020FASTVT: 1.5 ms
TZAT-0020SLOWVT: 1.5 ms
TZON-0003ATACH: 370 ms
TZON-0004VSLOWVT: 36
TZON-0005SLOWVT: 12
TZON-0008FASTVT: 180000 ms
TZON-0008SLOWVT: 180000 ms
TZON-0008VSLOWVT: 180000 ms
TZON-0010FASTVT: 50 ms
TZON-0010SLOWVT: 50 ms
TZST-0001FASTVT: 3
TZST-0001FASTVT: 4
TZST-0001FASTVT: 5
TZST-0001SLOWVT: 3
TZST-0001SLOWVT: 4
TZST-0001SLOWVT: 6
TZST-0002ATACH: NEGATIVE
TZST-0002ATACH: NEGATIVE
TZST-0002FASTVT: NEGATIVE
TZST-0002FASTVT: NEGATIVE
TZST-0003SLOWVT: 35 J
VENTRICULAR PACING ICD: 1.12 pct
VF: 0

## 2012-03-26 NOTE — Patient Instructions (Signed)
Your physician wants you to follow-up in: 12 months with Dr Court Joy will receive a reminder letter in the mail two months in advance. If you don't receive a letter, please call our office to schedule the follow-up appointment.  Remote monitoring is used to monitor your Pacemaker of ICD from home. This monitoring reduces the number of office visits required to check your device to one time per year. It allows [place] to keep an eye on the functioning of your device to ensure it is working properly. You are scheduled for a device check from home on 07/01/12. You may send your transmission at any time that day. If you have a wireless device, the transmission will be sent automatically. After your physician reviews your transmission, you will receive a postcard with your next transmission date.     Dr Neena Rhymes [facility] [phone]   Dr Leonides Grills  [phone] foot doctor

## 2012-03-26 NOTE — Progress Notes (Signed)
HPI Mrs. Hannig returns today for followup. She is a very pleasant 66 year old woman with a history of an ischemic cardiomyopathy status post anterior myocardial infarction, chronic systolic heart failure, status post ICD implantation. The patient has been bothered by increasing weight gain. She admits to dietary indiscretion. She is not exercising. No syncope. She does have some arthritic complaints. No Known Allergies   Current Outpatient Prescriptions  Medication Sig Dispense Refill  . aspirin 81 MG tablet Take 81 mg by mouth daily.        . B Complex-C-Folic Acid (STRESS FORMULA) TABS Take 1 tablet by mouth daily.        . carvedilol (COREG) 25 MG tablet Take 25 mg by mouth 2 (two) times daily with a meal.        . cholecalciferol (VITAMIN D) 1000 UNITS tablet Take 1,000 Units by mouth daily.        . Cyanocobalamin 2500 MCG TABS Take 1 tablet by mouth daily.        . furosemide (LASIX) 40 MG tablet Take 40 mg by mouth as needed.      . Omega-3 Fatty Acids (FISH OIL) 1000 MG CAPS Take 1 capsule by mouth daily.        Marland Kitchen PARoxetine (PAXIL) 40 MG tablet Take 40 mg by mouth every morning.        . ramipril (ALTACE) 2.5 MG tablet Take 2.5 mg by mouth daily.        . simvastatin (ZOCOR) 40 MG tablet Take 40 mg by mouth at bedtime.        . topiramate (TOPAMAX) 100 MG tablet Take 100 mg by mouth 2 (two) times daily.        . traMADol (ULTRAM) 50 MG tablet Take 50 mg by mouth every 6 (six) hours as needed.        . vitamin E 600 UNIT capsule Take 600 Units by mouth daily.        Marland Kitchen zolpidem (AMBIEN) 10 MG tablet Take 10 mg by mouth at bedtime as needed.        . methocarbamol (ROBAXIN) 750 MG tablet Take 750 mg by mouth as needed.           Past Medical History  Diagnosis Date  . Dyslipidemia   . Ventricular tachycardia   . Myocardial infarct   . Ischemic cardiomyopathy     ROS:   All systems reviewed and negative except as noted in the HPI.   No past surgical history on  file.   Family History  Problem Relation Age of Onset  . Hypertension Mother   . Thyroid disease Mother   . Coronary artery disease Father      History   Social History  . Marital Status: Married    Spouse Name: N/A    Number of Children: N/A  . Years of Education: N/A   Occupational History  . Not on file.   Social History Main Topics  . Smoking status: Former Games developer  . Smokeless tobacco: Not on file  . Alcohol Use: No  . Drug Use: No  . Sexually Active: Not on file   Other Topics Concern  . Not on file   Social History Narrative  . No narrative on file     BP 98/68  Pulse 67  Ht 5\' 7"  (1.702 m)  Wt 186 lb (84.369 kg)  BMI 29.13 kg/m2  SpO2 97%  Physical Exam:  Well appearing middle-aged woman, NAD HEENT:  Unremarkable Neck:  7 cm JVD, no thyromegally Lungs:  Clear with no wheezes, rales, or rhonchi. Well-healed ICD incision. HEART:  Regular rate rhythm, no murmurs, no rubs, no clicks Abd:  soft, positive bowel sounds, no organomegally, no rebound, no guarding Ext:  2 plus pulses, no edema, no cyanosis, no clubbing Skin:  No rashes no nodules Neuro:  CN II through XII intact, motor grossly intact  EKG normal sinus rhythm with prior anterior MI, age undetermined  DEVICE  Normal device function.  See PaceArt for details.   Assess/Plan:

## 2012-07-01 ENCOUNTER — Ambulatory Visit (INDEPENDENT_AMBULATORY_CARE_PROVIDER_SITE_OTHER): Payer: Medicare Other | Admitting: *Deleted

## 2012-07-01 DIAGNOSIS — Z95810 Presence of automatic (implantable) cardiac defibrillator: Secondary | ICD-10-CM

## 2012-07-01 DIAGNOSIS — I509 Heart failure, unspecified: Secondary | ICD-10-CM

## 2012-07-01 DIAGNOSIS — I4729 Other ventricular tachycardia: Secondary | ICD-10-CM

## 2012-07-01 DIAGNOSIS — I472 Ventricular tachycardia: Secondary | ICD-10-CM

## 2012-07-03 ENCOUNTER — Encounter: Payer: Self-pay | Admitting: Internal Medicine

## 2012-07-08 LAB — REMOTE ICD DEVICE
BAMS-0001: 165 {beats}/min
BATTERY VOLTAGE: 2.66 V
DEV-0020ICD: NEGATIVE
FVT: 0
PACEART VT: 0
RV LEAD AMPLITUDE: 8 mv
TOT-0001: 10
TZAT-0001ATACH: 1
TZAT-0001ATACH: 2
TZAT-0001ATACH: 3
TZAT-0002ATACH: NEGATIVE
TZAT-0002ATACH: NEGATIVE
TZAT-0011SLOWVT: 10 ms
TZAT-0012SLOWVT: 200 ms
TZAT-0018ATACH: NEGATIVE
TZAT-0018ATACH: NEGATIVE
TZAT-0019ATACH: 6 V
TZAT-0019ATACH: 6 V
TZAT-0019SLOWVT: 8 V
TZAT-0020ATACH: 1.5 ms
TZAT-0020FASTVT: 1.5 ms
TZON-0003SLOWVT: 370 ms
TZON-0003VSLOWVT: 430 ms
TZON-0004VSLOWVT: 36
TZON-0005SLOWVT: 12
TZON-0008FASTVT: 180000 ms
TZON-0008SLOWVT: 180000 ms
TZON-0008VSLOWVT: 180000 ms
TZON-0010FASTVT: 50 ms
TZST-0001ATACH: 6
TZST-0001FASTVT: 2
TZST-0001FASTVT: 3
TZST-0001SLOWVT: 3
TZST-0001SLOWVT: 5
TZST-0001SLOWVT: 6
TZST-0002ATACH: NEGATIVE
TZST-0002FASTVT: NEGATIVE
TZST-0003SLOWVT: 20 J
TZST-0003SLOWVT: 35 J

## 2012-07-12 ENCOUNTER — Encounter: Payer: Self-pay | Admitting: *Deleted

## 2012-10-07 ENCOUNTER — Other Ambulatory Visit: Payer: Self-pay | Admitting: Internal Medicine

## 2012-10-07 ENCOUNTER — Ambulatory Visit (INDEPENDENT_AMBULATORY_CARE_PROVIDER_SITE_OTHER): Payer: Medicare Other | Admitting: *Deleted

## 2012-10-07 DIAGNOSIS — I4729 Other ventricular tachycardia: Secondary | ICD-10-CM

## 2012-10-07 DIAGNOSIS — Z95810 Presence of automatic (implantable) cardiac defibrillator: Secondary | ICD-10-CM

## 2012-10-07 DIAGNOSIS — I472 Ventricular tachycardia, unspecified: Secondary | ICD-10-CM

## 2012-10-11 LAB — REMOTE ICD DEVICE
AL AMPLITUDE: 3.1 mv
BAMS-0001: 165 {beats}/min
BATTERY VOLTAGE: 2.64 V
CHARGE TIME: 11.651 s
DEV-0020ICD: NEGATIVE
FVT: 0
PACEART VT: 0
RV LEAD AMPLITUDE: 7.7 mv
RV LEAD IMPEDENCE ICD: 360 Ohm
TOT-0001: 10
TOT-0002: 0
TZAT-0001ATACH: 1
TZAT-0001ATACH: 3
TZAT-0002ATACH: NEGATIVE
TZAT-0002ATACH: NEGATIVE
TZAT-0004SLOWVT: 8
TZAT-0005SLOWVT: 81 pct
TZAT-0011SLOWVT: 10 ms
TZAT-0012ATACH: 150 ms
TZAT-0012FASTVT: 200 ms
TZAT-0012SLOWVT: 200 ms
TZAT-0018ATACH: NEGATIVE
TZAT-0018ATACH: NEGATIVE
TZAT-0019ATACH: 6 V
TZAT-0019ATACH: 6 V
TZAT-0019FASTVT: 8 V
TZAT-0019SLOWVT: 8 V
TZAT-0020FASTVT: 1.5 ms
TZON-0003SLOWVT: 370 ms
TZON-0004VSLOWVT: 36
TZON-0010FASTVT: 50 ms
TZST-0001ATACH: 5
TZST-0001ATACH: 6
TZST-0001FASTVT: 2
TZST-0001FASTVT: 5
TZST-0001FASTVT: 6
TZST-0001SLOWVT: 3
TZST-0001SLOWVT: 6
TZST-0002ATACH: NEGATIVE
TZST-0002ATACH: NEGATIVE
TZST-0002ATACH: NEGATIVE
TZST-0002FASTVT: NEGATIVE
TZST-0002FASTVT: NEGATIVE
TZST-0002FASTVT: NEGATIVE
TZST-0003SLOWVT: 20 J
TZST-0003SLOWVT: 35 J

## 2012-11-05 ENCOUNTER — Encounter: Payer: Self-pay | Admitting: *Deleted

## 2012-11-06 ENCOUNTER — Encounter: Payer: Self-pay | Admitting: Internal Medicine

## 2013-01-13 ENCOUNTER — Encounter: Payer: Self-pay | Admitting: Internal Medicine

## 2013-01-13 ENCOUNTER — Ambulatory Visit (INDEPENDENT_AMBULATORY_CARE_PROVIDER_SITE_OTHER): Payer: Medicare Other | Admitting: *Deleted

## 2013-01-13 DIAGNOSIS — I2589 Other forms of chronic ischemic heart disease: Secondary | ICD-10-CM

## 2013-01-13 DIAGNOSIS — Z95810 Presence of automatic (implantable) cardiac defibrillator: Secondary | ICD-10-CM

## 2013-01-15 LAB — REMOTE ICD DEVICE
AL AMPLITUDE: 3.3 mv
AL IMPEDENCE ICD: 568 Ohm
CHARGE TIME: 12.562 s
PACEART VT: 0
RV LEAD AMPLITUDE: 9 mv
RV LEAD IMPEDENCE ICD: 360 Ohm
TOT-0001: 10
TOT-0002: 0
TOT-0006: 20071019000000
TZAT-0001ATACH: 1
TZAT-0002ATACH: NEGATIVE
TZAT-0002ATACH: NEGATIVE
TZAT-0002ATACH: NEGATIVE
TZAT-0012ATACH: 150 ms
TZAT-0012ATACH: 150 ms
TZAT-0012FASTVT: 200 ms
TZAT-0018ATACH: NEGATIVE
TZAT-0018ATACH: NEGATIVE
TZAT-0019ATACH: 6 V
TZAT-0019ATACH: 6 V
TZAT-0019SLOWVT: 8 V
TZAT-0020FASTVT: 1.5 ms
TZAT-0020SLOWVT: 1.5 ms
TZON-0003ATACH: 370 ms
TZON-0008SLOWVT: 180000 ms
TZON-0008VSLOWVT: 180000 ms
TZON-0010FASTVT: 50 ms
TZON-0010SLOWVT: 50 ms
TZST-0001ATACH: 4
TZST-0001ATACH: 5
TZST-0001FASTVT: 5
TZST-0001SLOWVT: 2
TZST-0001SLOWVT: 4
TZST-0001SLOWVT: 6
TZST-0002ATACH: NEGATIVE
TZST-0002ATACH: NEGATIVE
TZST-0002FASTVT: NEGATIVE
TZST-0002FASTVT: NEGATIVE
TZST-0003SLOWVT: 20 J
TZST-0003SLOWVT: 35 J

## 2013-01-23 ENCOUNTER — Encounter: Payer: Self-pay | Admitting: *Deleted

## 2013-01-23 ENCOUNTER — Encounter (HOSPITAL_COMMUNITY): Payer: Self-pay

## 2013-01-23 ENCOUNTER — Ambulatory Visit (INDEPENDENT_AMBULATORY_CARE_PROVIDER_SITE_OTHER): Payer: Medicare Other | Admitting: Cardiology

## 2013-01-23 ENCOUNTER — Encounter: Payer: Self-pay | Admitting: Cardiology

## 2013-01-23 VITALS — BP 106/67 | HR 73 | Ht 67.0 in | Wt 182.8 lb

## 2013-01-23 DIAGNOSIS — I5022 Chronic systolic (congestive) heart failure: Secondary | ICD-10-CM

## 2013-01-23 DIAGNOSIS — I219 Acute myocardial infarction, unspecified: Secondary | ICD-10-CM

## 2013-01-23 DIAGNOSIS — I255 Ischemic cardiomyopathy: Secondary | ICD-10-CM

## 2013-01-23 DIAGNOSIS — I472 Ventricular tachycardia: Secondary | ICD-10-CM

## 2013-01-23 DIAGNOSIS — Z4502 Encounter for adjustment and management of automatic implantable cardiac defibrillator: Secondary | ICD-10-CM

## 2013-01-23 DIAGNOSIS — Z95810 Presence of automatic (implantable) cardiac defibrillator: Secondary | ICD-10-CM

## 2013-01-23 DIAGNOSIS — I2589 Other forms of chronic ischemic heart disease: Secondary | ICD-10-CM

## 2013-01-23 DIAGNOSIS — I2510 Atherosclerotic heart disease of native coronary artery without angina pectoris: Secondary | ICD-10-CM

## 2013-01-23 NOTE — Progress Notes (Signed)
 ELECTROPHYSIOLOGY OFFICE NOTE  Patient ID: Rachel Bates MRN: [id number], DOB/AGE: 68/14/1947   Date of Visit: 01/24/2013  Primary Physician: No primary provider on file. Primary Cardiologist: Gregg Taylor, MD Reason for Visit: EP/device follow-up; ICD battery at [facility]  History of Present Illness  Rachel Bates is a 67 y.o. female is a very pleasant 67-year-old woman with an ischemic cardiomyopathy s/p ICD implant, CAD status post anterior myocardial infarction and chronic systolic heart failure who presents today for routine electrophysiology followup. Her ICD battery is at [facility].   Since last being seen in our clinic, she reports she is doing well and has no complaints. She denies chest pain or shortness of breath. She denies palpitations, dizziness, near syncope or syncope. She denies LE swelling, orthopnea, PND or recent weight gain. She is compliant and tolerating medications without difficulty.  Past Medical History Past Medical History  Diagnosis Date  . Dyslipidemia   . Ventricular tachycardia   . Myocardial infarct   . Ischemic cardiomyopathy     Past Surgical History Past Surgical History  Procedure Laterality Date  . Cardiac defibrillator placement      Allergies/Intolerances No Known Allergies  Current Home Medications Current Outpatient Prescriptions  Medication Sig Dispense Refill  . aspirin 81 MG tablet Take 81 mg by mouth daily.        . B Complex-C-Folic Acid (STRESS FORMULA) TABS Take 1 tablet by mouth daily.       . carvedilol (COREG) 25 MG tablet Take 25 mg by mouth 2 (two) times daily with a meal.        . Cyanocobalamin 2500 MCG TABS Take 1 tablet by mouth daily.        . furosemide (LASIX) 40 MG tablet Take 40 mg by mouth as needed.      . Omega-3 Fatty Acids (FISH OIL) 1000 MG CAPS Take 1 capsule by mouth daily.       . PARoxetine (PAXIL) 40 MG tablet Take 40 mg by mouth every morning.        . ramipril (ALTACE) 2.5 MG tablet Take 2.5 mg by mouth  daily.        . simvastatin (ZOCOR) 40 MG tablet Take 40 mg by mouth at bedtime.        . temazepam (RESTORIL) 15 MG capsule Take 15 mg by mouth at bedtime as needed.       . topiramate (TOPAMAX) 100 MG tablet Take 100 mg by mouth 2 (two) times daily.        . Calcium Carbonate-Vitamin D (CALCIUM-D) 600-400 MG-UNIT TABS Take 1 tablet by mouth 2 (two) times daily.      . diphenhydrAMINE (BENADRYL) 25 mg capsule Take 25 mg by mouth daily.      . Multiple Vitamins-Minerals (MULTIVITAMIN WITH MINERALS) tablet Take 1 tablet by mouth daily.      . oxyCODONE-acetaminophen (PERCOCET/ROXICET) 5-325 MG per tablet Take 1 tablet by mouth every 4 (four) hours as needed for pain.      . vitamin E 400 UNIT capsule Take 400 Units by mouth daily.       No current facility-administered medications for this visit.   Social History Social History  . Marital Status: Married   Social History Main Topics  . Smoking status: Former Smoker  . Smokeless tobacco: Not on file  . Alcohol Use: No  . Drug Use: No   Review of Systems General: No chills, fever, night sweats or weight changes Cardiovascular: No   chest pain, dyspnea on exertion, edema, orthopnea, palpitations, paroxysmal nocturnal dyspnea Dermatological: No rash, lesions or masses Respiratory: No cough, dyspnea Urologic: No hematuria, dysuria Abdominal: No nausea, vomiting, diarrhea, bright red blood per rectum, melena, or hematemesis Neurologic: No visual changes, weakness, changes in mental status All other systems reviewed and are otherwise negative except as noted above.  Physical Exam Vitals: Blood pressure 106/67, pulse 73, height 5' 7" (1.702 m), weight 182 lb 12.8 oz (82.918 kg).  General: Well developed, well appearing 67 y.o. female in no acute distress. HEENT: Normocephalic, atraumatic. EOMs intact. Sclera nonicteric. Oropharynx clear.  Neck: Supple. No JVD. Lungs: Respirations regular and unlabored, CTA bilaterally. No wheezes, rales or  rhonchi. Heart: RRR. S1, S2 present. No murmurs, rub, S3 or S4. Abdomen: Soft, non-distended.  Extremities: No clubbing, cyanosis or edema. PT/Radials 2+ and equal bilaterally. Psych: Normal affect. Neuro: Alert and oriented X 3. Moves all extremities spontaneously.   Diagnostics Device interrogation - Battery voltage 2.62 - [facility]; otherwise normal device function; sensing, threshold and impedance within normal range; no arrhythmias; no programming changes made; see PaceArt report for full details  Assessment and Plan 1. Ischemic CM s/p ICD implant, now ICD battery at [facility] Discussed indication for ICD generator change. Reviewed procedure including risks and benefits with Ms. Cichy in detail. These risks include but are not limited to bleeding, infection and/or lead dislodgement. She expressed verbal understanding and wishes to proceed.  2. Chronic systolic HF Stable; euvolemic by exam today Continue medical therapy 3. CAD s/p prior MI Stable without anginal symptoms Continue medical therapy   Signed, Aveyah Greenwood, PA-C 01/24/2013, 9:09 AM    

## 2013-01-23 NOTE — Patient Instructions (Addendum)
Your physician has recommended that you have a ICD GENERATOR CHECK. A pacemaker is a small device that is placed under the skin of your chest or abdomen to help control abnormal heart rhythms. This device uses electrical pulses to prompt the heart to beat at a normal rate. Pacemakers are used to treat heart rhythms that are too slow. Wire (leads) are attached to the pacemaker that goes into the chambers of you heart. This is done in the hospital and usually requires and overnight stay. Please see the instruction sheet given to you today for more information.  Your physician recommends that you return for lab work in: January 29, 2013

## 2013-01-24 ENCOUNTER — Encounter: Payer: Self-pay | Admitting: Cardiology

## 2013-01-28 ENCOUNTER — Telehealth: Payer: Self-pay | Admitting: Internal Medicine

## 2013-01-28 LAB — ICD DEVICE OBSERVATION
AL AMPLITUDE: 3.6 mv
ATRIAL PACING ICD: 8.9 pct
DEV-0020ICD: NEGATIVE
FVT: 0
RV LEAD IMPEDENCE ICD: 368 Ohm
RV LEAD THRESHOLD: 1 V
TZAT-0001ATACH: 1
TZAT-0001ATACH: 2
TZAT-0001FASTVT: 1
TZAT-0002ATACH: NEGATIVE
TZAT-0002ATACH: NEGATIVE
TZAT-0002FASTVT: NEGATIVE
TZAT-0011SLOWVT: 10 ms
TZAT-0012ATACH: 150 ms
TZAT-0012FASTVT: 200 ms
TZAT-0018ATACH: NEGATIVE
TZAT-0018ATACH: NEGATIVE
TZAT-0019ATACH: 6 V
TZAT-0019ATACH: 6 V
TZAT-0019SLOWVT: 8 V
TZAT-0020ATACH: 1.5 ms
TZAT-0020SLOWVT: 1.5 ms
TZON-0003ATACH: 370 ms
TZON-0004VSLOWVT: 36
TZON-0005SLOWVT: 12
TZON-0008FASTVT: 180000 ms
TZON-0008SLOWVT: 180000 ms
TZON-0008VSLOWVT: 180000 ms
TZON-0010FASTVT: 50 ms
TZON-0010SLOWVT: 50 ms
TZST-0001ATACH: 4
TZST-0001FASTVT: 3
TZST-0001FASTVT: 4
TZST-0001FASTVT: 5
TZST-0001SLOWVT: 3
TZST-0001SLOWVT: 4
TZST-0001SLOWVT: 6
TZST-0002ATACH: NEGATIVE
TZST-0002ATACH: NEGATIVE
TZST-0002FASTVT: NEGATIVE
TZST-0002FASTVT: NEGATIVE
TZST-0002FASTVT: NEGATIVE
TZST-0003SLOWVT: 20 J
TZST-0003SLOWVT: 35 J
TZST-0003SLOWVT: 35 J
VENTRICULAR PACING ICD: 0.7 pct

## 2013-01-28 NOTE — Telephone Encounter (Signed)
Discussed with Sharrell Ku should be okay to proceed with colonscopy after his wound check

## 2013-01-28 NOTE — Telephone Encounter (Signed)
New Prob  Pt is due for a colonoscopy, she wants to know when it will be ok for her to have this done.

## 2013-01-29 ENCOUNTER — Other Ambulatory Visit (INDEPENDENT_AMBULATORY_CARE_PROVIDER_SITE_OTHER): Payer: Medicare Other

## 2013-01-29 ENCOUNTER — Other Ambulatory Visit: Payer: Self-pay

## 2013-01-29 DIAGNOSIS — I219 Acute myocardial infarction, unspecified: Secondary | ICD-10-CM

## 2013-01-29 DIAGNOSIS — I2589 Other forms of chronic ischemic heart disease: Secondary | ICD-10-CM

## 2013-01-29 DIAGNOSIS — I2510 Atherosclerotic heart disease of native coronary artery without angina pectoris: Secondary | ICD-10-CM

## 2013-01-29 LAB — BASIC METABOLIC PANEL
Calcium: 9.1 mg/dL (ref 8.4–10.5)
GFR: 75.85 mL/min (ref >60.00)
Glucose, Bld: 85 mg/dL (ref 70–99)
Potassium: 4.1 mEq/L (ref 3.5–5.1)
Sodium: 141 mEq/L (ref 135–145)

## 2013-01-29 LAB — CBC WITH DIFFERENTIAL/PLATELET
Basophils Absolute: 0 10*3/uL (ref 0.0–0.1)
Eosinophils Relative: 2.1 % (ref 0.0–5.0)
HCT: 42.9 % (ref 36.0–46.0)
Hemoglobin: 14.6 g/dL (ref 12.0–15.0)
Lymphocytes Relative: 34.2 % (ref 12.0–46.0)
Lymphs Abs: 1.4 10*3/uL (ref 0.7–4.0)
Monocytes Relative: 10.8 % (ref 3.0–12.0)
Neutro Abs: 2.1 10*3/uL (ref 1.4–7.7)
RDW: 14.3 % (ref 11.5–14.6)
WBC: 4 10*3/uL — ABNORMAL LOW (ref 4.5–10.5)

## 2013-01-29 LAB — PROTIME-INR
INR: 1.1 ratio — ABNORMAL HIGH (ref 0.8–1.0)
Prothrombin Time: 11.7 s (ref 10.2–12.4)

## 2013-02-03 MED ORDER — SODIUM CHLORIDE 0.9 % IR SOLN
80.0000 mg | Status: DC
  Filled 2013-02-03 (×2): qty 2

## 2013-02-03 MED ORDER — CEFAZOLIN SODIUM-DEXTROSE 2-3 GM-% IV SOLR
2.0000 g | INTRAVENOUS | Status: DC
  Filled 2013-02-03: qty 50

## 2013-02-04 ENCOUNTER — Encounter (HOSPITAL_COMMUNITY)
Admission: RE | Disposition: A | Discharge status: Home / Self Care | Payer: Self-pay | Source: Ambulatory Visit | Attending: Internal Medicine

## 2013-02-04 ENCOUNTER — Ambulatory Visit (HOSPITAL_COMMUNITY)
Admission: RE | Admit: 2013-02-04 | Discharge: 2013-02-04 | Disposition: A | Discharge status: Home / Self Care | Payer: Medicare Other | Source: Ambulatory Visit | Attending: Internal Medicine | Admitting: Internal Medicine

## 2013-02-04 DIAGNOSIS — Z79899 Other long term (current) drug therapy: Secondary | ICD-10-CM | POA: Diagnosis not present

## 2013-02-04 DIAGNOSIS — Z4502 Encounter for adjustment and management of automatic implantable cardiac defibrillator: Secondary | ICD-10-CM | POA: Diagnosis present

## 2013-02-04 DIAGNOSIS — I4729 Other ventricular tachycardia: Secondary | ICD-10-CM | POA: Insufficient documentation

## 2013-02-04 DIAGNOSIS — I472 Ventricular tachycardia, unspecified: Secondary | ICD-10-CM | POA: Insufficient documentation

## 2013-02-04 DIAGNOSIS — I2589 Other forms of chronic ischemic heart disease: Secondary | ICD-10-CM

## 2013-02-04 DIAGNOSIS — G4730 Sleep apnea, unspecified: Secondary | ICD-10-CM | POA: Diagnosis not present

## 2013-02-04 DIAGNOSIS — I509 Heart failure, unspecified: Secondary | ICD-10-CM | POA: Diagnosis not present

## 2013-02-04 HISTORY — PX: IMPLANTABLE CARDIOVERTER DEFIBRILLATOR GENERATOR CHANGE: SHX5474

## 2013-02-04 LAB — SURGICAL PCR SCREEN
MRSA, PCR: NEGATIVE
Staphylococcus aureus: NEGATIVE

## 2013-02-04 SURGERY — IMPLANTABLE CARDIOVERTER DEFIBRILLATOR GENERATOR CHANGE
Anesthesia: LOCAL

## 2013-02-04 MED ORDER — SODIUM CHLORIDE 0.9 % IV SOLN
INTRAVENOUS | Status: DC
  Administered 2013-02-04: 07:00:00 via INTRAVENOUS
  Administered 2013-02-04: 1000 mL via INTRAVENOUS

## 2013-02-04 MED ORDER — MIDAZOLAM HCL 5 MG/5ML IJ SOLN
INTRAMUSCULAR | Status: AC
  Filled 2013-02-04: qty 5

## 2013-02-04 MED ORDER — FENTANYL CITRATE 0.05 MG/ML IJ SOLN
INTRAMUSCULAR | Status: AC
  Filled 2013-02-04: qty 2

## 2013-02-04 MED ORDER — ONDANSETRON HCL 4 MG/2ML IJ SOLN: 4.0000 mg | Freq: Four times a day (QID) | INTRAMUSCULAR | Status: DC | PRN

## 2013-02-04 MED ORDER — LIDOCAINE HCL (PF) 1 % IJ SOLN
INTRAMUSCULAR | Status: AC
  Filled 2013-02-04: qty 60

## 2013-02-04 MED ORDER — ACETAMINOPHEN 325 MG PO TABS: 325.0000 mg | ORAL_TABLET | ORAL | Status: DC | PRN

## 2013-02-04 MED ORDER — MUPIROCIN 2 % EX OINT
TOPICAL_OINTMENT | CUTANEOUS | Status: AC
  Filled 2013-02-04: qty 22

## 2013-02-04 MED ORDER — CHLORHEXIDINE GLUCONATE 4 % EX LIQD: 60.0000 mL | Freq: Once | CUTANEOUS | Status: DC

## 2013-02-04 MED ORDER — MUPIROCIN 2 % EX OINT
TOPICAL_OINTMENT | Freq: Two times a day (BID) | CUTANEOUS | Status: DC
  Administered 2013-02-04: 07:00:00 via NASAL

## 2013-02-04 NOTE — CV Procedure (Signed)
ICD removal and insertion of a new ICD with ICD pocket revision and DFT testing without immediate complication. D# [id number].

## 2013-02-04 NOTE — H&P (View-Only) (Signed)
ELECTROPHYSIOLOGY OFFICE NOTE  Patient ID: Rachel Bates MRN: [id number], DOB/AGE: 09/13/1945   Date of Visit: 01/24/2013  Primary Physician: No primary provider on file. Primary Cardiologist: Lewayne Bunting, MD Reason for Visit: EP/device follow-up; ICD battery at [facility]  History of Present Illness  Rachel Bates is a 67 y.o. female is a very pleasant 67 year old woman with an ischemic cardiomyopathy s/p ICD implant, CAD status post anterior myocardial infarction and chronic systolic heart failure who presents today for routine electrophysiology followup. Her ICD battery is at [facility].   Since last being seen in our clinic, she reports she is doing well and has no complaints. She denies chest pain or shortness of breath. She denies palpitations, dizziness, near syncope or syncope. She denies LE swelling, orthopnea, PND or recent weight gain. She is compliant and tolerating medications without difficulty.  Past Medical History Past Medical History  Diagnosis Date  . Dyslipidemia   . Ventricular tachycardia   . Myocardial infarct   . Ischemic cardiomyopathy     Past Surgical History Past Surgical History  Procedure Laterality Date  . Cardiac defibrillator placement      Allergies/Intolerances No Known Allergies  Current Home Medications Current Outpatient Prescriptions  Medication Sig Dispense Refill  . aspirin 81 MG tablet Take 81 mg by mouth daily.        . B Complex-C-Folic Acid (STRESS FORMULA) TABS Take 1 tablet by mouth daily.       . carvedilol (COREG) 25 MG tablet Take 25 mg by mouth 2 (two) times daily with a meal.        . Cyanocobalamin 2500 MCG TABS Take 1 tablet by mouth daily.        . furosemide (LASIX) 40 MG tablet Take 40 mg by mouth as needed.      . Omega-3 Fatty Acids (FISH OIL) 1000 MG CAPS Take 1 capsule by mouth daily.       Marland Kitchen PARoxetine (PAXIL) 40 MG tablet Take 40 mg by mouth every morning.        . ramipril (ALTACE) 2.5 MG tablet Take 2.5 mg by mouth  daily.        . simvastatin (ZOCOR) 40 MG tablet Take 40 mg by mouth at bedtime.        . temazepam (RESTORIL) 15 MG capsule Take 15 mg by mouth at bedtime as needed.       . topiramate (TOPAMAX) 100 MG tablet Take 100 mg by mouth 2 (two) times daily.        . Calcium Carbonate-Vitamin D (CALCIUM-D) 600-400 MG-UNIT TABS Take 1 tablet by mouth 2 (two) times daily.      . diphenhydrAMINE (BENADRYL) 25 mg capsule Take 25 mg by mouth daily.      . Multiple Vitamins-Minerals (MULTIVITAMIN WITH MINERALS) tablet Take 1 tablet by mouth daily.      Marland Kitchen oxyCODONE-acetaminophen (PERCOCET/ROXICET) 5-325 MG per tablet Take 1 tablet by mouth every 4 (four) hours as needed for pain.      . vitamin E 400 UNIT capsule Take 400 Units by mouth daily.       No current facility-administered medications for this visit.   Social History Social History  . Marital Status: Married   Social History Main Topics  . Smoking status: Former Games developer  . Smokeless tobacco: Not on file  . Alcohol Use: No  . Drug Use: No   Review of Systems General: No chills, fever, night sweats or weight changes Cardiovascular: No  chest pain, dyspnea on exertion, edema, orthopnea, palpitations, paroxysmal nocturnal dyspnea Dermatological: No rash, lesions or masses Respiratory: No cough, dyspnea Urologic: No hematuria, dysuria Abdominal: No nausea, vomiting, diarrhea, bright red blood per rectum, melena, or hematemesis Neurologic: No visual changes, weakness, changes in mental status All other systems reviewed and are otherwise negative except as noted above.  Physical Exam Vitals: Blood pressure 106/67, pulse 73, height 5\' 7"  (1.702 m), weight 182 lb 12.8 oz (82.918 kg).  General: Well developed, well appearing 67 y.o. female in no acute distress. HEENT: Normocephalic, atraumatic. EOMs intact. Sclera nonicteric. Oropharynx clear.  Neck: Supple. No JVD. Lungs: Respirations regular and unlabored, CTA bilaterally. No wheezes, rales or  rhonchi. Heart: RRR. S1, S2 present. No murmurs, rub, S3 or S4. Abdomen: Soft, non-distended.  Extremities: No clubbing, cyanosis or edema. PT/Radials 2+ and equal bilaterally. Psych: Normal affect. Neuro: Alert and oriented X 3. Moves all extremities spontaneously.   Diagnostics Device interrogation - Battery voltage 2.62 - [facility]; otherwise normal device function; sensing, threshold and impedance within normal range; no arrhythmias; no programming changes made; see PaceArt report for full details  Assessment and Plan 1. Ischemic CM s/p ICD implant, now ICD battery at [facility] Discussed indication for ICD generator change. Reviewed procedure including risks and benefits with Ms. Fennema in detail. These risks include but are not limited to bleeding, infection and/or lead dislodgement. She expressed verbal understanding and wishes to proceed.  2. Chronic systolic HF Stable; euvolemic by exam today Continue medical therapy 3. CAD s/p prior MI Stable without anginal symptoms Continue medical therapy   Signed, Rick Duff, PA-C 01/24/2013, 9:09 AM

## 2013-02-04 NOTE — Consult Note (Addendum)
  ICD Criteria  Current LVEF (within 6 months):unknown%  NYHA Functional Classification: Class II  Heart Failure History:  Yes, Duration of heart failure since onset is > 9 months  Non-Ischemic Dilated Cardiomyopathy History:  No.  Atrial Fibrillation/Atrial Flutter:  No.  Ventricular Tachycardia History:  Yes, in [place], reason for ICD, sustained monomorphic VT  Cardiac Arrest History:  No  History of Syndromes with Risk of Sudden Death:  No.  Previous ICD:  Yes for VT  Electrophysiology Study: No.  Anticoagulation Therapy:  Patient is NOT on anticoagulation therapy.   Beta Blocker Therapy:  Yes.   Ace Inhibitor/ARB Therapy:  Yes.

## 2013-02-04 NOTE — Interval H&P Note (Signed)
History and Physical Interval Note: Since prior clinic visit, no change in history, physical exam, assessment and plan. She underwent ICD implant in [place] for VT and an Ischemic CM, EF 35%, class 2 CHF despite maximal medical therapy. She has over one year to live and has never been diagnosed with sleep apnea and denies problems with snoring.  02/04/2013 7:26 AM  Rachel Bates  has presented today for surgery, with the diagnosis of End of life  The various methods of treatment have been discussed with the patient and family. After consideration of risks, benefits and other options for treatment, the patient has consented to  Procedure(s): IMPLANTABLE CARDIOVERTER DEFIBRILLATOR GENERATOR CHANGE (N/A) as a surgical intervention .  The patient's history has been reviewed, patient examined, no change in status, stable for surgery.  I have reviewed the patient's chart and labs.  Questions were answered to the patient's satisfaction.     Rachel Bates.D.

## 2013-02-05 NOTE — Op Note (Signed)
Rachel Bates, Rachel Bates               ACCOUNT NO.:  [id number]  MEDICAL RECORD NO.:  [id number]  LOCATION:  [facility]                         FACILITY:  [facility]  PHYSICIAN:  Doylene Canning. Ladona Ridgel, MD    DATE OF BIRTH:  March 17, 1946  DATE OF PROCEDURE:  02/04/2013 DATE OF DISCHARGE:  02/04/2013                              OPERATIVE REPORT   PROCEDURES PERFORMED:  Removal of a previously implanted implantable cardioverter-defibrillator, which was at elective replacement; insertion of a new implantable cardioverter-defibrillator with implantable cardioverter-defibrillator pocket revision and defibrillation threshold testing.  INTRODUCTION:  The patient is a 67 year old woman with an ischemic cardiomyopathy status post anterior myocardial infarction and an ejection fraction of 35%, who developed sustained monomorphic VT and underwent ICD implantation in [place] many years ago.  She has class 2 congestive heart failure, on maximal medical therapy, and has otherwise been stable.  She has had no recent ventricular arrhythmias or ICD shocks.  She has reached elective replacement on her device and now presents for removal of the old device and insertion of a new one.  DESCRIPTION OF PROCEDURE:  After informed consent was obtained, the patient was taken to the diagnostic EP lab in a fasting state.  After usual preparation and draping, intravenous fentanyl and midazolam was given for sedation.  A 30 mL of lidocaine was infiltrated into the left infraclavicular region.  A 7-cm incision was carried out over this region and the electrocautery was utilized to dissect down to the ICD pocket.  On entering the pocket, approximately 15-20 mL of clear serous fluid came out.  This fluid was cultured.  At this point, the generator was removed with gentle traction.  The leads were found to be bound down fairly significantly and they were freed up from their dense fibrous adhesions.  The ICD pocket was revised as  there was extensive scar tissue present.  The leads were evaluated and found to be working satisfactorily.  The P-waves were 2, the R-waves were 8, the impedance 475 in the atrium and 361 in the right ventricle.  The threshold was less than 1 V at 0.4 milliseconds in both the atrium and the ventricle. With these satisfactory parameters and after the pocket had been revised to accommodate the different shape of the new device, the Medtronic Evera XT DR dual-chamber defibrillator, serial #[id number] was connected to the atrial and defibrillation lead and placed back in the subcutaneous pocket.  The pocket was irrigated with antibiotic irrigation and electrocautery was utilized to assure hemostasis.  The incision was closed with 2-0 and 3-0 Vicryl.  At this point, I scrubbed out of the case to supervise defibrillation threshold testing.  After the patient was much more deeply sedated under my direct supervision, ventricular fibrillation was induced with a T-wave shock. A 20-joule shock was then delivered which terminated ventricular fibrillation and restored sinus rhythm.  No additional defibrillation threshold testing was carried out and benzoin and Steri-Strips were painted on the skin.  A pressure dressing was applied and the patient returned to her room after successful removal of a previously implanted Medtronic dual-chamber ICD and insertion of a new Medtronic dual-chamber ICD with defibrillation threshold testing and ICD  pocket revision.     Doylene Canning. Ladona Ridgel, MD     GWT/MEDQ  D:  02/04/2013  T:  02/05/2013  Job:  [id number]

## 2013-02-12 ENCOUNTER — Other Ambulatory Visit: Payer: Self-pay

## 2013-02-13 ENCOUNTER — Ambulatory Visit (INDEPENDENT_AMBULATORY_CARE_PROVIDER_SITE_OTHER): Payer: Medicare Other | Admitting: *Deleted

## 2013-02-13 ENCOUNTER — Telehealth: Payer: Self-pay | Admitting: Internal Medicine

## 2013-02-13 ENCOUNTER — Encounter: Payer: Self-pay | Admitting: Internal Medicine

## 2013-02-13 DIAGNOSIS — I472 Ventricular tachycardia: Secondary | ICD-10-CM

## 2013-02-13 LAB — ICD DEVICE OBSERVATION
AL IMPEDENCE ICD: 475 Ohm
AL THRESHOLD: 0.75 V
ATRIAL PACING ICD: 15.4 pct
RV LEAD AMPLITUDE: 10.9 mv
RV LEAD IMPEDENCE ICD: 399 Ohm
VENTRICULAR PACING ICD: 0 pct

## 2013-02-13 NOTE — Telephone Encounter (Signed)
New problem   Rachel Bates need cardiac clearance for pt to have colonoscopy. Please call or fax to [phone].

## 2013-02-13 NOTE — Progress Notes (Signed)
Wound check defib in clinic. Normal device function. Site well healed with no redness or swelling. ROV in 3 mths w/GT.

## 2013-02-19 NOTE — Telephone Encounter (Signed)
Okay per Dr Ladona Ridgel to have procedure after 03/07/13

## 2013-02-25 ENCOUNTER — Telehealth: Payer: Self-pay | Admitting: *Deleted

## 2013-02-25 NOTE — Telephone Encounter (Signed)
Pt called in and felt like something had come a loose from device. Pt just recently had gen change on 02-04-13 and per pt was something sticking in arm when it was raised and had been like that since gen change. Starting 02-25-13, when raising arms sticking sensation was gone. Pt is concerned that something has broke off. Advised pt to send transmission but has not received new transmitter for new device. Pt scheduled for 02-26-13 @ 1030 for sticking sensation.

## 2013-02-26 ENCOUNTER — Ambulatory Visit (INDEPENDENT_AMBULATORY_CARE_PROVIDER_SITE_OTHER): Payer: Medicare Other | Admitting: *Deleted

## 2013-02-26 DIAGNOSIS — I2589 Other forms of chronic ischemic heart disease: Secondary | ICD-10-CM

## 2013-02-26 LAB — ICD DEVICE OBSERVATION

## 2013-03-24 ENCOUNTER — Encounter: Payer: Self-pay | Admitting: Internal Medicine

## 2013-04-01 ENCOUNTER — Ambulatory Visit (INDEPENDENT_AMBULATORY_CARE_PROVIDER_SITE_OTHER): Payer: Medicare Other | Admitting: Internal Medicine

## 2013-04-01 ENCOUNTER — Encounter: Payer: Self-pay | Admitting: Internal Medicine

## 2013-04-01 VITALS — BP 100/57 | HR 76 | Wt 188.0 lb

## 2013-04-01 DIAGNOSIS — I472 Ventricular tachycardia: Secondary | ICD-10-CM

## 2013-04-01 DIAGNOSIS — I2589 Other forms of chronic ischemic heart disease: Secondary | ICD-10-CM

## 2013-04-01 DIAGNOSIS — R0989 Other specified symptoms and signs involving the circulatory and respiratory systems: Secondary | ICD-10-CM

## 2013-04-01 DIAGNOSIS — R42 Dizziness and giddiness: Secondary | ICD-10-CM

## 2013-04-01 NOTE — Patient Instructions (Signed)
Your physician recommends that you schedule a follow-up appointment as scheduled  Remote monitoring is used to monitor your Pacemaker or ICD from home. This monitoring reduces the number of office visits required to check your device to one time per year. It allows [place] to keep an eye on the functioning of your device to ensure it is working properly. You are scheduled for a device check from home on 05/12/13. You may send your transmission at any time that day. If you have a wireless device, the transmission will be sent automatically. After your physician reviews your transmission, you will receive a postcard with your next transmission date.   Your physician has requested that you have a carotid duplex. This test is an ultrasound of the carotid arteries in your neck. It looks at blood flow through these arteries that supply the brain with blood. Allow one hour for this exam. There are no restrictions or special instructions.

## 2013-04-02 ENCOUNTER — Encounter: Payer: Self-pay | Admitting: Internal Medicine

## 2013-04-02 ENCOUNTER — Telehealth: Payer: Self-pay | Admitting: Internal Medicine

## 2013-04-02 DIAGNOSIS — R42 Dizziness and giddiness: Secondary | ICD-10-CM | POA: Insufficient documentation

## 2013-04-02 NOTE — Telephone Encounter (Signed)
Called patient back. She states that she is not taking Ambien but is taking Temazepam 15mg  po HS. Advised will fix med list.

## 2013-04-02 NOTE — Telephone Encounter (Signed)
New Problem:  Pt states she would like to update her medication list. Pt states she had an appt earlier this week and she just noticed her med list was not accurate. Please call back for an up to date list.

## 2013-04-02 NOTE — Assessment & Plan Note (Signed)
The patient has a bruit on physical examination. Carotid vascular disease would be a very unlikely cause of dizziness, but I would like to have her undergo carotid ultrasound to evaluate this finding and to determine whether or not her vertebral flow is antegrade. Based on the results of the ultrasound, she may require additional testing with CT angiography or invasive cerebral angiography. With her ICD in place, she is not a candidate for MRI scanning. She will likely require neurologic followup.

## 2013-04-02 NOTE — Progress Notes (Signed)
HPI Rachel Bates returns today for followup. She is a very pleasant 67 year old woman with an ischemic cardiomyopathy, chronic class II systolic heart failure, and ventricular tachycardia. She is status post ICD implantation with recent generator change. The patient has noted episodes of dizziness. She denies frank syncope. She is also had trouble with balance and coordination. She has not fallen. The patient states that she has had a history of cerebral vascular problems in the past though I do not know the details. Previously, she lived in [place] where her medical care was delivered. We do not have access to these records at this time. No Known Allergies   Current Outpatient Prescriptions  Medication Sig Dispense Refill  . aspirin 81 MG tablet Take 81 mg by mouth daily.        . B Complex-C-Folic Acid (STRESS FORMULA) TABS Take 1 tablet by mouth daily.       . Calcium Carbonate-Vitamin D (CALCIUM-D) 600-400 MG-UNIT TABS Take 1 tablet by mouth 2 (two) times daily.      . carvedilol (COREG) 25 MG tablet Take 25 mg by mouth 2 (two) times daily with a meal.        . Cyanocobalamin 2500 MCG TABS Take 1 tablet by mouth daily.        . diphenhydrAMINE (BENADRYL) 25 mg capsule Take 25 mg by mouth daily.      . furosemide (LASIX) 40 MG tablet Take 40 mg by mouth as needed.      . Multiple Vitamins-Minerals (MULTIVITAMIN WITH MINERALS) tablet Take 1 tablet by mouth daily.      . Omega-3 Fatty Acids (FISH OIL) 1000 MG CAPS Take 1 capsule by mouth daily.       Marland Kitchen oxyCODONE-acetaminophen (PERCOCET/ROXICET) 5-325 MG per tablet Take 1 tablet by mouth every 4 (four) hours as needed for pain.      Marland Kitchen PARoxetine (PAXIL) 40 MG tablet Take 40 mg by mouth every morning.        . ramipril (ALTACE) 2.5 MG tablet Take 2.5 mg by mouth daily.        . simvastatin (ZOCOR) 40 MG tablet Take 40 mg by mouth at bedtime.        . temazepam (RESTORIL) 15 MG capsule Take 15 mg by mouth at bedtime as needed.       .  topiramate (TOPAMAX) 100 MG tablet Take 100 mg by mouth 2 (two) times daily.        . vitamin E 400 UNIT capsule Take 400 Units by mouth daily.       No current facility-administered medications for this visit.     Past Medical History  Diagnosis Date  . Dyslipidemia   . Ventricular tachycardia   . Myocardial infarct   . Ischemic cardiomyopathy     ROS:   All systems reviewed and negative except as noted in the HPI.   Past Surgical History  Procedure Laterality Date  . Cardiac defibrillator placement       Family History  Problem Relation Age of Onset  . Hypertension Mother   . Thyroid disease Mother   . Coronary artery disease Father      History   Social History  . Marital Status: Married    Spouse Name: N/A    Number of Children: N/A  . Years of Education: N/A   Occupational History  . Not on file.   Social History Main Topics  . Smoking status: Former Games developer  . Smokeless tobacco:  Not on file  . Alcohol Use: No  . Drug Use: No  . Sexual Activity: Not on file   Other Topics Concern  . Not on file   Social History Narrative  . No narrative on file     BP 100/57  Pulse 76  Wt 188 lb (85.276 kg)  BMI 29.44 kg/m2  Physical Exam:  Well appearing 67 year old woman, NAD HEENT: Unremarkable Neck:  No JVD, no thyromegally, a soft carotid bruit is present on the left Back:  No CVA tenderness Lungs:  Clear HEART:  Regular rate rhythm, no murmurs, no rubs, no clicks Abd:  soft, positive bowel sounds, no organomegally, no rebound, no guarding Ext:  2 plus pulses, no edema, no cyanosis, no clubbing Skin:  No rashes no nodules Neuro:  CN II through XII intact, motor grossly intact   DEVICE  Device previously interrogated and working normally, not interrogated today.  Assess/Plan:

## 2013-04-02 NOTE — Assessment & Plan Note (Signed)
She has had no sustained ventricular arrhythmias. There is no evidence that her dizziness is related to hypotension from ventricular arrhythmias.

## 2013-04-02 NOTE — Assessment & Plan Note (Signed)
She denies anginal symptoms. She'll continue her current medical therapy. 

## 2013-04-08 ENCOUNTER — Encounter: Payer: Medicare Other | Admitting: Internal Medicine

## 2013-04-14 HISTORY — PX: COLONOSCOPY: SHX174

## 2013-04-18 ENCOUNTER — Ambulatory Visit (HOSPITAL_COMMUNITY): Payer: Medicare Other | Attending: Internal Medicine

## 2013-04-18 DIAGNOSIS — R0989 Other specified symptoms and signs involving the circulatory and respiratory systems: Secondary | ICD-10-CM

## 2013-04-18 DIAGNOSIS — I2510 Atherosclerotic heart disease of native coronary artery without angina pectoris: Secondary | ICD-10-CM | POA: Insufficient documentation

## 2013-04-18 DIAGNOSIS — R42 Dizziness and giddiness: Secondary | ICD-10-CM

## 2013-04-18 DIAGNOSIS — Z951 Presence of aortocoronary bypass graft: Secondary | ICD-10-CM | POA: Insufficient documentation

## 2013-04-18 DIAGNOSIS — E785 Hyperlipidemia, unspecified: Secondary | ICD-10-CM | POA: Insufficient documentation

## 2013-04-18 DIAGNOSIS — Z87891 Personal history of nicotine dependence: Secondary | ICD-10-CM | POA: Insufficient documentation

## 2013-05-12 ENCOUNTER — Ambulatory Visit (INDEPENDENT_AMBULATORY_CARE_PROVIDER_SITE_OTHER): Payer: Medicare Other | Admitting: *Deleted

## 2013-05-12 DIAGNOSIS — I2589 Other forms of chronic ischemic heart disease: Secondary | ICD-10-CM

## 2013-05-12 DIAGNOSIS — Z95810 Presence of automatic (implantable) cardiac defibrillator: Secondary | ICD-10-CM

## 2013-05-12 DIAGNOSIS — I472 Ventricular tachycardia: Secondary | ICD-10-CM

## 2013-05-15 ENCOUNTER — Other Ambulatory Visit: Payer: Self-pay

## 2013-05-19 ENCOUNTER — Encounter: Payer: Self-pay | Admitting: Internal Medicine

## 2013-05-25 LAB — MDC_IDC_ENUM_SESS_TYPE_REMOTE
Brady Statistic AP VS Percent: 13.42 %
Brady Statistic AS VP Percent: 0.04 %
Brady Statistic AS VS Percent: 86.53 %
Date Time Interrogation Session: 20141110172437
HighPow Impedance: 171 Ohm
HighPow Impedance: 47 Ohm
HighPow Impedance: 62 Ohm
Lead Channel Impedance Value: 475 Ohm
Lead Channel Pacing Threshold Amplitude: 1 V
Lead Channel Pacing Threshold Pulse Width: 0.4 ms
Lead Channel Pacing Threshold Pulse Width: 0.4 ms
Lead Channel Setting Pacing Amplitude: 2.5 V
Lead Channel Setting Pacing Pulse Width: 0.4 ms
Lead Channel Setting Sensing Sensitivity: 0.3 mV
Zone Setting Detection Interval: 350 ms
Zone Setting Detection Interval: 450 ms

## 2013-05-27 ENCOUNTER — Telehealth: Payer: Self-pay | Admitting: *Deleted

## 2013-05-27 NOTE — Telephone Encounter (Signed)
The patient is aware of her carotid artery duplex results.

## 2013-05-28 ENCOUNTER — Encounter: Payer: Self-pay | Admitting: *Deleted

## 2013-07-10 HISTORY — PX: SHOULDER ARTHROSCOPY: SHX128

## 2013-08-22 ENCOUNTER — Encounter: Payer: Medicare Other | Admitting: *Deleted

## 2013-08-27 ENCOUNTER — Encounter: Payer: Self-pay | Admitting: *Deleted

## 2013-09-01 ENCOUNTER — Ambulatory Visit (INDEPENDENT_AMBULATORY_CARE_PROVIDER_SITE_OTHER): Payer: Medicare Other | Admitting: *Deleted

## 2013-09-01 ENCOUNTER — Encounter: Payer: Self-pay | Admitting: Internal Medicine

## 2013-09-01 DIAGNOSIS — I2589 Other forms of chronic ischemic heart disease: Secondary | ICD-10-CM

## 2013-09-01 DIAGNOSIS — I472 Ventricular tachycardia: Secondary | ICD-10-CM

## 2013-09-01 DIAGNOSIS — I4729 Other ventricular tachycardia: Secondary | ICD-10-CM

## 2013-09-01 DIAGNOSIS — I509 Heart failure, unspecified: Secondary | ICD-10-CM

## 2013-09-16 LAB — MDC_IDC_ENUM_SESS_TYPE_REMOTE
Brady Statistic AP VP Percent: 0.02 %
Brady Statistic AP VS Percent: 20.49 %
Brady Statistic AS VS Percent: 79.44 %
Brady Statistic RV Percent Paced: 0.07 %
Date Time Interrogation Session: 20150223205246
HIGH POWER IMPEDANCE MEASURED VALUE: 70 Ohm
HighPow Impedance: 171 Ohm
HighPow Impedance: 52 Ohm
Lead Channel Impedance Value: 456 Ohm
Lead Channel Pacing Threshold Amplitude: 0.625 V
Lead Channel Pacing Threshold Pulse Width: 0.4 ms
Lead Channel Sensing Intrinsic Amplitude: 10.125 mV
Lead Channel Sensing Intrinsic Amplitude: 10.125 mV
Lead Channel Sensing Intrinsic Amplitude: 3 mV
Lead Channel Setting Pacing Amplitude: 2.5 V
Lead Channel Setting Pacing Pulse Width: 0.4 ms
MDC IDC MSMT BATTERY REMAINING LONGEVITY: 126 mo
MDC IDC MSMT BATTERY VOLTAGE: 3.04 V
MDC IDC MSMT LEADCHNL RA PACING THRESHOLD PULSEWIDTH: 0.4 ms
MDC IDC MSMT LEADCHNL RA SENSING INTR AMPL: 3 mV
MDC IDC MSMT LEADCHNL RV IMPEDANCE VALUE: 399 Ohm
MDC IDC MSMT LEADCHNL RV PACING THRESHOLD AMPLITUDE: 1 V
MDC IDC SET LEADCHNL RA PACING AMPLITUDE: 2 V
MDC IDC SET LEADCHNL RV SENSING SENSITIVITY: 0.3 mV
MDC IDC SET ZONE DETECTION INTERVAL: 320 ms
MDC IDC SET ZONE DETECTION INTERVAL: 360 ms
MDC IDC STAT BRADY AS VP PERCENT: 0.05 %
MDC IDC STAT BRADY RA PERCENT PACED: 20.51 %
Zone Setting Detection Interval: 350 ms
Zone Setting Detection Interval: 450 ms

## 2013-09-24 ENCOUNTER — Telehealth: Payer: Self-pay | Admitting: Internal Medicine

## 2013-09-24 NOTE — Telephone Encounter (Signed)
Let them know today is Dr Tanna Furry first day back in the office.  Will fax once complete

## 2013-09-24 NOTE — Telephone Encounter (Signed)
New message     Faxed clearance for rotator cuff repair on 09-19-13 and have not received it back.  Please fax clearance to [phone].

## 2013-09-25 ENCOUNTER — Telehealth: Payer: Self-pay | Admitting: Internal Medicine

## 2013-09-25 NOTE — Telephone Encounter (Signed)
Received request from Nurse fax box, documents faxed for surgical clearance. To: [facility] Fax number: [phone] Attention: [id number]/[id number]

## 2013-09-26 ENCOUNTER — Encounter: Payer: Self-pay | Admitting: Internal Medicine

## 2013-09-29 NOTE — Telephone Encounter (Signed)
Follow up     Office calling check on status cardiac clearance -  Need clearance information fax back to office today .

## 2013-09-29 NOTE — Telephone Encounter (Signed)
Left message for  Marlowe Kays CMA  and let her know Dr Lovena Le has not been in the office since her original request and he will be here tomorrow to address.

## 2013-09-30 ENCOUNTER — Telehealth: Payer: Self-pay | Admitting: Internal Medicine

## 2013-09-30 NOTE — Telephone Encounter (Signed)
New message     Pt needs clearance for shoulder surgery.  Dr Samule Dry in [place] need clearance.  Surgery is scheduled for Thursday 10-02-13.  Please fax form that was faxed to [place] back ASAP.

## 2013-09-30 NOTE — Telephone Encounter (Signed)
Dr Lovena Le here tomorrow to fax

## 2013-10-01 NOTE — Telephone Encounter (Signed)
Okay to proceed per Dr Lovena Le.  Low risk  Note faxed

## 2013-12-04 ENCOUNTER — Ambulatory Visit (INDEPENDENT_AMBULATORY_CARE_PROVIDER_SITE_OTHER): Payer: Medicare Other | Admitting: Internal Medicine

## 2013-12-04 ENCOUNTER — Encounter: Payer: Self-pay | Admitting: Internal Medicine

## 2013-12-04 VITALS — BP 126/82 | HR 91 | Ht 67.0 in | Wt 159.6 lb

## 2013-12-04 DIAGNOSIS — I472 Ventricular tachycardia: Secondary | ICD-10-CM

## 2013-12-04 DIAGNOSIS — I4729 Other ventricular tachycardia: Secondary | ICD-10-CM

## 2013-12-04 DIAGNOSIS — I509 Heart failure, unspecified: Secondary | ICD-10-CM

## 2013-12-04 DIAGNOSIS — Z95810 Presence of automatic (implantable) cardiac defibrillator: Secondary | ICD-10-CM

## 2013-12-04 DIAGNOSIS — I2589 Other forms of chronic ischemic heart disease: Secondary | ICD-10-CM

## 2013-12-04 LAB — MDC_IDC_ENUM_SESS_TYPE_INCLINIC
Battery Remaining Longevity: 123 mo
Brady Statistic AP VP Percent: 0.1 %
Brady Statistic AP VS Percent: 15.2 %
Brady Statistic AS VP Percent: 0.1 %
HighPow Impedance: 52 Ohm
Lead Channel Impedance Value: 513 Ohm
Lead Channel Sensing Intrinsic Amplitude: 11.3 mV
Lead Channel Sensing Intrinsic Amplitude: 3.4 mV
Lead Channel Setting Pacing Amplitude: 2 V
Lead Channel Setting Pacing Amplitude: 2.5 V
Lead Channel Setting Pacing Pulse Width: 0.4 ms
MDC IDC MSMT LEADCHNL RA PACING THRESHOLD AMPLITUDE: 0.5 V
MDC IDC MSMT LEADCHNL RA PACING THRESHOLD PULSEWIDTH: 0.4 ms
MDC IDC MSMT LEADCHNL RV IMPEDANCE VALUE: 399 Ohm
MDC IDC MSMT LEADCHNL RV PACING THRESHOLD AMPLITUDE: 1.25 V
MDC IDC MSMT LEADCHNL RV PACING THRESHOLD PULSEWIDTH: 0.4 ms
MDC IDC SET LEADCHNL RV SENSING SENSITIVITY: 0.3 mV
MDC IDC SET ZONE DETECTION INTERVAL: 360 ms
MDC IDC SET ZONE DETECTION INTERVAL: 450 ms
MDC IDC STAT BRADY AS VS PERCENT: 84.7 %
Zone Setting Detection Interval: 320 ms
Zone Setting Detection Interval: 350 ms

## 2013-12-04 NOTE — Assessment & Plan Note (Signed)
Her medtronic ICD is working normally. Will recheck in several months.

## 2013-12-04 NOTE — Assessment & Plan Note (Signed)
She denies anginal symptoms and remains fairly active, no chang in her meds.

## 2013-12-04 NOTE — Progress Notes (Signed)
HPI [name] returns today for followup. She is a very pleasant 68 year old woman with an ischemic cardiomyopathy, chronic class II systolic heart failure, and ventricular tachycardia. She is status post ICD implantation with generator change. The patient denies palpitations. She has lost 20 lbs. She has moved to be closer to her family. No problems with her meds. No edema. No chst pain or sob. No Known Allergies   Current Outpatient Prescriptions  Medication Sig Dispense Refill  . aspirin 81 MG tablet Take 81 mg by mouth daily.        . B Complex-C-Folic Acid (STRESS FORMULA) TABS Take 1 tablet by mouth daily.       . Calcium Carbonate-Vitamin D (CALCIUM-D) 600-400 MG-UNIT TABS Take 1 tablet by mouth 2 (two) times daily.      . carvedilol (COREG) 25 MG tablet Take 25 mg by mouth 2 (two) times daily with a meal.        . Cyanocobalamin 2500 MCG TABS Take 1 tablet by mouth daily.        . diphenhydrAMINE (BENADRYL) 25 mg capsule Take 25 mg by mouth daily.      . furosemide (LASIX) 40 MG tablet Take 40 mg by mouth as needed.      . Multiple Vitamins-Minerals (MULTIVITAMIN WITH MINERALS) tablet Take 1 tablet by mouth daily.      . Omega-3 Fatty Acids (FISH OIL) 1000 MG CAPS Take 1 capsule by mouth daily.       Marland Kitchen oxyCODONE-acetaminophen (PERCOCET/ROXICET) 5-325 MG per tablet Take 1 tablet by mouth every 4 (four) hours as needed for pain.      Marland Kitchen PARoxetine (PAXIL) 40 MG tablet Take 40 mg by mouth every morning.        . ramipril (ALTACE) 2.5 MG tablet Take 2.5 mg by mouth daily.        . simvastatin (ZOCOR) 40 MG tablet Take 40 mg by mouth at bedtime.        . temazepam (RESTORIL) 15 MG capsule Take 15 mg by mouth at bedtime as needed.       . topiramate (TOPAMAX) 100 MG tablet Take 100 mg by mouth 2 (two) times daily.        . vitamin E 400 UNIT capsule Take 400 Units by mouth daily.       No current facility-administered medications for this visit.     Past Medical History  Diagnosis  Date  . Dyslipidemia   . Ventricular tachycardia   . Myocardial infarct   . Ischemic cardiomyopathy     ROS:   All systems reviewed and negative except as noted in the HPI.   Past Surgical History  Procedure Laterality Date  . Cardiac defibrillator placement       Family History  Problem Relation Age of Onset  . Hypertension Mother   . Thyroid disease Mother   . Coronary artery disease Father      History   Social History  . Marital Status: Married    Spouse Name: N/A    Number of Children: N/A  . Years of Education: N/A   Occupational History  . Not on file.   Social History Main Topics  . Smoking status: Former Research scientist (life sciences)  . Smokeless tobacco: Not on file  . Alcohol Use: No  . Drug Use: No  . Sexual Activity: Not on file   Other Topics Concern  . Not on file   Social History Narrative  . No narrative on file  BP 126/82  Pulse 91  Ht 5\' 7"  (1.702 m)  Wt 159 lb 9.6 oz (72.394 kg)  BMI 24.99 kg/m2  Physical Exam:  Well appearing 68 year old woman, NAD HEENT: Unremarkable Neck:  No JVD, no thyromegally, a soft carotid bruit is present on the left Back:  No CVA tenderness Lungs:  Clear HEART:  Regular rate rhythm, no murmurs, no rubs, no clicks Abd:  soft, positive bowel sounds, no organomegally, no rebound, no guarding Ext:  2 plus pulses, no edema, no cyanosis, no clubbing Skin:  No rashes no nodules Neuro:  CN II through XII intact, motor grossly intact   DEVICE  Normal function.  Assess/Plan:

## 2013-12-04 NOTE — Assessment & Plan Note (Signed)
She has had one non-sustained episode. Continue current meds.

## 2013-12-04 NOTE — Patient Instructions (Signed)
Remote monitoring is used to monitor your Pacemaker of ICD from home. This monitoring reduces the number of office visits required to check your device to one time per year. It allows [place] to keep an eye on the functioning of your device to ensure it is working properly. You are scheduled for a device check from home on 03/10/14. You may send your transmission at any time that day. If you have a wireless device, the transmission will be sent automatically. After your physician reviews your transmission, you will receive a postcard with your next transmission date.    Your physician wants you to follow-up in: 12 months with Dr Knox Saliva will receive a reminder letter in the mail two months in advance. If you don't receive a letter, please call our office to schedule the follow-up appointment.

## 2014-03-09 ENCOUNTER — Ambulatory Visit (INDEPENDENT_AMBULATORY_CARE_PROVIDER_SITE_OTHER): Payer: Medicare Other | Admitting: *Deleted

## 2014-03-09 ENCOUNTER — Telehealth: Payer: Self-pay | Admitting: Cardiology

## 2014-03-09 DIAGNOSIS — I4729 Other ventricular tachycardia: Secondary | ICD-10-CM

## 2014-03-09 DIAGNOSIS — I472 Ventricular tachycardia: Secondary | ICD-10-CM

## 2014-03-09 LAB — MDC_IDC_ENUM_SESS_TYPE_REMOTE
Battery Remaining Longevity: 121 mo
Battery Voltage: 3.01 V
Brady Statistic AP VP Percent: 0.01 %
Brady Statistic RV Percent Paced: 0.04 %
Date Time Interrogation Session: 20150831214311
HIGH POWER IMPEDANCE MEASURED VALUE: 171 Ohm
HIGH POWER IMPEDANCE MEASURED VALUE: 55 Ohm
HIGH POWER IMPEDANCE MEASURED VALUE: 71 Ohm
Lead Channel Pacing Threshold Amplitude: 0.625 V
Lead Channel Pacing Threshold Pulse Width: 0.4 ms
Lead Channel Sensing Intrinsic Amplitude: 8.625 mV
Lead Channel Sensing Intrinsic Amplitude: 8.625 mV
Lead Channel Setting Pacing Amplitude: 2 V
Lead Channel Setting Pacing Amplitude: 2.5 V
Lead Channel Setting Sensing Sensitivity: 0.3 mV
MDC IDC MSMT LEADCHNL RA IMPEDANCE VALUE: 513 Ohm
MDC IDC MSMT LEADCHNL RA SENSING INTR AMPL: 2.75 mV
MDC IDC MSMT LEADCHNL RA SENSING INTR AMPL: 2.75 mV
MDC IDC MSMT LEADCHNL RV IMPEDANCE VALUE: 361 Ohm
MDC IDC MSMT LEADCHNL RV PACING THRESHOLD AMPLITUDE: 1 V
MDC IDC MSMT LEADCHNL RV PACING THRESHOLD PULSEWIDTH: 0.4 ms
MDC IDC SET LEADCHNL RV PACING PULSEWIDTH: 0.4 ms
MDC IDC SET ZONE DETECTION INTERVAL: 320 ms
MDC IDC SET ZONE DETECTION INTERVAL: 360 ms
MDC IDC STAT BRADY AP VS PERCENT: 15.86 %
MDC IDC STAT BRADY AS VP PERCENT: 0.04 %
MDC IDC STAT BRADY AS VS PERCENT: 84.1 %
MDC IDC STAT BRADY RA PERCENT PACED: 15.87 %
Zone Setting Detection Interval: 350 ms
Zone Setting Detection Interval: 450 ms

## 2014-03-09 NOTE — Telephone Encounter (Signed)
Spoke with pt and reminded pt of remote transmission that is due today. Pt verbalized understanding.   

## 2014-03-10 NOTE — Progress Notes (Signed)
Remote ICD transmission.   

## 2014-03-19 ENCOUNTER — Encounter: Payer: Self-pay | Admitting: Cardiology

## 2014-03-24 ENCOUNTER — Encounter: Payer: Self-pay | Admitting: Cardiology

## 2014-03-31 ENCOUNTER — Encounter: Payer: Self-pay | Admitting: Internal Medicine

## 2014-06-10 ENCOUNTER — Ambulatory Visit (INDEPENDENT_AMBULATORY_CARE_PROVIDER_SITE_OTHER): Payer: Medicare Other | Admitting: *Deleted

## 2014-06-10 ENCOUNTER — Encounter: Payer: Self-pay | Admitting: Internal Medicine

## 2014-06-10 ENCOUNTER — Telehealth: Payer: Self-pay | Admitting: Cardiology

## 2014-06-10 DIAGNOSIS — I4729 Other ventricular tachycardia: Secondary | ICD-10-CM

## 2014-06-10 DIAGNOSIS — I5022 Chronic systolic (congestive) heart failure: Secondary | ICD-10-CM

## 2014-06-10 DIAGNOSIS — I472 Ventricular tachycardia: Secondary | ICD-10-CM

## 2014-06-10 NOTE — Telephone Encounter (Signed)
Spoke with pt and reminded pt of remote transmission that is due today. Pt verbalized understanding.   

## 2014-06-10 NOTE — Progress Notes (Signed)
Remote ICD transmission.   

## 2014-06-11 LAB — MDC_IDC_ENUM_SESS_TYPE_REMOTE
Battery Voltage: 3.01 V
Brady Statistic AS VP Percent: 0.03 %
Brady Statistic RA Percent Paced: 15.78 %
HIGH POWER IMPEDANCE MEASURED VALUE: 50 Ohm
HighPow Impedance: 63 Ohm
Lead Channel Impedance Value: 361 Ohm
Lead Channel Pacing Threshold Pulse Width: 0.4 ms
Lead Channel Sensing Intrinsic Amplitude: 2.875 mV
Lead Channel Sensing Intrinsic Amplitude: 8.625 mV
Lead Channel Sensing Intrinsic Amplitude: 8.625 mV
Lead Channel Setting Pacing Amplitude: 2 V
Lead Channel Setting Sensing Sensitivity: 0.3 mV
MDC IDC MSMT BATTERY REMAINING LONGEVITY: 118 mo
MDC IDC MSMT LEADCHNL RA IMPEDANCE VALUE: 475 Ohm
MDC IDC MSMT LEADCHNL RA PACING THRESHOLD AMPLITUDE: 0.625 V
MDC IDC MSMT LEADCHNL RA SENSING INTR AMPL: 2.875 mV
MDC IDC MSMT LEADCHNL RV IMPEDANCE VALUE: 285 Ohm
MDC IDC MSMT LEADCHNL RV PACING THRESHOLD AMPLITUDE: 1 V
MDC IDC MSMT LEADCHNL RV PACING THRESHOLD PULSEWIDTH: 0.4 ms
MDC IDC SESS DTM: 20151202194511
MDC IDC SET LEADCHNL RV PACING AMPLITUDE: 2.5 V
MDC IDC SET LEADCHNL RV PACING PULSEWIDTH: 0.4 ms
MDC IDC SET ZONE DETECTION INTERVAL: 320 ms
MDC IDC SET ZONE DETECTION INTERVAL: 350 ms
MDC IDC STAT BRADY AP VP PERCENT: 0.01 %
MDC IDC STAT BRADY AP VS PERCENT: 15.77 %
MDC IDC STAT BRADY AS VS PERCENT: 84.19 %
MDC IDC STAT BRADY RV PERCENT PACED: 0.04 %
Zone Setting Detection Interval: 360 ms
Zone Setting Detection Interval: 450 ms

## 2014-06-18 ENCOUNTER — Encounter (HOSPITAL_COMMUNITY): Payer: Self-pay | Admitting: Internal Medicine

## 2014-06-25 ENCOUNTER — Encounter: Payer: Self-pay | Admitting: Cardiology

## 2014-07-09 ENCOUNTER — Encounter: Payer: Self-pay | Admitting: Cardiology

## 2014-07-16 DIAGNOSIS — M545 Low back pain: Secondary | ICD-10-CM | POA: Diagnosis not present

## 2014-07-16 DIAGNOSIS — R2689 Other abnormalities of gait and mobility: Secondary | ICD-10-CM | POA: Diagnosis not present

## 2014-07-16 DIAGNOSIS — M6281 Muscle weakness (generalized): Secondary | ICD-10-CM | POA: Diagnosis not present

## 2014-07-20 DIAGNOSIS — M545 Low back pain: Secondary | ICD-10-CM | POA: Diagnosis not present

## 2014-07-20 DIAGNOSIS — R2689 Other abnormalities of gait and mobility: Secondary | ICD-10-CM | POA: Diagnosis not present

## 2014-07-20 DIAGNOSIS — M6281 Muscle weakness (generalized): Secondary | ICD-10-CM | POA: Diagnosis not present

## 2014-07-23 DIAGNOSIS — M6281 Muscle weakness (generalized): Secondary | ICD-10-CM | POA: Diagnosis not present

## 2014-07-23 DIAGNOSIS — R2689 Other abnormalities of gait and mobility: Secondary | ICD-10-CM | POA: Diagnosis not present

## 2014-07-23 DIAGNOSIS — M545 Low back pain: Secondary | ICD-10-CM | POA: Diagnosis not present

## 2014-07-28 DIAGNOSIS — M545 Low back pain: Secondary | ICD-10-CM | POA: Diagnosis not present

## 2014-07-28 DIAGNOSIS — M6281 Muscle weakness (generalized): Secondary | ICD-10-CM | POA: Diagnosis not present

## 2014-07-28 DIAGNOSIS — R2689 Other abnormalities of gait and mobility: Secondary | ICD-10-CM | POA: Diagnosis not present

## 2014-07-30 DIAGNOSIS — R2689 Other abnormalities of gait and mobility: Secondary | ICD-10-CM | POA: Diagnosis not present

## 2014-07-30 DIAGNOSIS — M545 Low back pain: Secondary | ICD-10-CM | POA: Diagnosis not present

## 2014-07-30 DIAGNOSIS — M6281 Muscle weakness (generalized): Secondary | ICD-10-CM | POA: Diagnosis not present

## 2014-08-03 DIAGNOSIS — R2689 Other abnormalities of gait and mobility: Secondary | ICD-10-CM | POA: Diagnosis not present

## 2014-08-03 DIAGNOSIS — M6281 Muscle weakness (generalized): Secondary | ICD-10-CM | POA: Diagnosis not present

## 2014-08-03 DIAGNOSIS — M545 Low back pain: Secondary | ICD-10-CM | POA: Diagnosis not present

## 2014-08-06 DIAGNOSIS — R2689 Other abnormalities of gait and mobility: Secondary | ICD-10-CM | POA: Diagnosis not present

## 2014-08-06 DIAGNOSIS — M6281 Muscle weakness (generalized): Secondary | ICD-10-CM | POA: Diagnosis not present

## 2014-08-06 DIAGNOSIS — M545 Low back pain: Secondary | ICD-10-CM | POA: Diagnosis not present

## 2014-08-10 DIAGNOSIS — M545 Low back pain: Secondary | ICD-10-CM | POA: Diagnosis not present

## 2014-08-10 DIAGNOSIS — M6281 Muscle weakness (generalized): Secondary | ICD-10-CM | POA: Diagnosis not present

## 2014-08-10 DIAGNOSIS — R2689 Other abnormalities of gait and mobility: Secondary | ICD-10-CM | POA: Diagnosis not present

## 2014-08-13 DIAGNOSIS — M545 Low back pain: Secondary | ICD-10-CM | POA: Diagnosis not present

## 2014-08-13 DIAGNOSIS — R2689 Other abnormalities of gait and mobility: Secondary | ICD-10-CM | POA: Diagnosis not present

## 2014-08-13 DIAGNOSIS — M6281 Muscle weakness (generalized): Secondary | ICD-10-CM | POA: Diagnosis not present

## 2014-08-14 DIAGNOSIS — M1711 Unilateral primary osteoarthritis, right knee: Secondary | ICD-10-CM | POA: Diagnosis not present

## 2014-08-17 DIAGNOSIS — R2689 Other abnormalities of gait and mobility: Secondary | ICD-10-CM | POA: Diagnosis not present

## 2014-08-17 DIAGNOSIS — M6281 Muscle weakness (generalized): Secondary | ICD-10-CM | POA: Diagnosis not present

## 2014-08-17 DIAGNOSIS — M545 Low back pain: Secondary | ICD-10-CM | POA: Diagnosis not present

## 2014-08-20 DIAGNOSIS — M6281 Muscle weakness (generalized): Secondary | ICD-10-CM | POA: Diagnosis not present

## 2014-08-20 DIAGNOSIS — M545 Low back pain: Secondary | ICD-10-CM | POA: Diagnosis not present

## 2014-08-20 DIAGNOSIS — R2689 Other abnormalities of gait and mobility: Secondary | ICD-10-CM | POA: Diagnosis not present

## 2014-08-25 DIAGNOSIS — M545 Low back pain: Secondary | ICD-10-CM | POA: Diagnosis not present

## 2014-08-25 DIAGNOSIS — M6281 Muscle weakness (generalized): Secondary | ICD-10-CM | POA: Diagnosis not present

## 2014-08-25 DIAGNOSIS — R2689 Other abnormalities of gait and mobility: Secondary | ICD-10-CM | POA: Diagnosis not present

## 2014-09-09 ENCOUNTER — Ambulatory Visit (INDEPENDENT_AMBULATORY_CARE_PROVIDER_SITE_OTHER): Payer: Medicare Other | Admitting: *Deleted

## 2014-09-09 DIAGNOSIS — I472 Ventricular tachycardia, unspecified: Secondary | ICD-10-CM

## 2014-09-09 DIAGNOSIS — I5022 Chronic systolic (congestive) heart failure: Secondary | ICD-10-CM | POA: Diagnosis not present

## 2014-09-09 NOTE — Progress Notes (Signed)
Remote ICD transmission.   

## 2014-09-13 LAB — MDC_IDC_ENUM_SESS_TYPE_REMOTE
HIGH POWER IMPEDANCE MEASURED VALUE: 69 Ohm
HighPow Impedance: 52 Ohm
Lead Channel Impedance Value: 475 Ohm
Lead Channel Pacing Threshold Amplitude: 0.875 V
Lead Channel Pacing Threshold Pulse Width: 0.4 ms
Lead Channel Sensing Intrinsic Amplitude: 8.625 mV
Lead Channel Setting Pacing Amplitude: 2.5 V
Lead Channel Setting Sensing Sensitivity: 0.3 mV
MDC IDC MSMT BATTERY REMAINING LONGEVITY: 115 mo
MDC IDC MSMT BATTERY VOLTAGE: 3.01 V
MDC IDC MSMT LEADCHNL RA PACING THRESHOLD AMPLITUDE: 0.75 V
MDC IDC MSMT LEADCHNL RA PACING THRESHOLD PULSEWIDTH: 0.4 ms
MDC IDC MSMT LEADCHNL RA SENSING INTR AMPL: 2.75 mV
MDC IDC MSMT LEADCHNL RV IMPEDANCE VALUE: 304 Ohm
MDC IDC MSMT LEADCHNL RV IMPEDANCE VALUE: 399 Ohm
MDC IDC SESS DTM: 20160302142903
MDC IDC SET LEADCHNL RA PACING AMPLITUDE: 2 V
MDC IDC SET LEADCHNL RV PACING PULSEWIDTH: 0.4 ms
MDC IDC SET ZONE DETECTION INTERVAL: 450 ms
MDC IDC STAT BRADY AP VP PERCENT: 0.01 %
MDC IDC STAT BRADY AP VS PERCENT: 14.59 %
MDC IDC STAT BRADY AS VP PERCENT: 0.03 %
MDC IDC STAT BRADY AS VS PERCENT: 85.37 %
MDC IDC STAT BRADY RA PERCENT PACED: 14.6 %
MDC IDC STAT BRADY RV PERCENT PACED: 0.04 %
Zone Setting Detection Interval: 320 ms
Zone Setting Detection Interval: 350 ms
Zone Setting Detection Interval: 360 ms

## 2014-09-24 ENCOUNTER — Encounter: Payer: Self-pay | Admitting: Cardiology

## 2014-09-30 ENCOUNTER — Encounter: Payer: Self-pay | Admitting: Internal Medicine

## 2014-10-08 ENCOUNTER — Encounter: Payer: Self-pay | Admitting: Cardiology

## 2014-10-15 DIAGNOSIS — G43009 Migraine without aura, not intractable, without status migrainosus: Secondary | ICD-10-CM | POA: Diagnosis not present

## 2014-11-03 DIAGNOSIS — Z87891 Personal history of nicotine dependence: Secondary | ICD-10-CM | POA: Diagnosis not present

## 2014-11-03 DIAGNOSIS — R6889 Other general symptoms and signs: Secondary | ICD-10-CM | POA: Diagnosis not present

## 2014-11-03 DIAGNOSIS — J09X2 Influenza due to identified novel influenza A virus with other respiratory manifestations: Secondary | ICD-10-CM | POA: Diagnosis not present

## 2014-11-19 DIAGNOSIS — R05 Cough: Secondary | ICD-10-CM | POA: Diagnosis not present

## 2014-11-19 DIAGNOSIS — J101 Influenza due to other identified influenza virus with other respiratory manifestations: Secondary | ICD-10-CM | POA: Diagnosis not present

## 2014-11-19 DIAGNOSIS — J069 Acute upper respiratory infection, unspecified: Secondary | ICD-10-CM | POA: Diagnosis not present

## 2014-11-19 DIAGNOSIS — R0981 Nasal congestion: Secondary | ICD-10-CM | POA: Diagnosis not present

## 2014-12-08 ENCOUNTER — Encounter: Payer: Self-pay | Admitting: Internal Medicine

## 2014-12-08 ENCOUNTER — Ambulatory Visit (INDEPENDENT_AMBULATORY_CARE_PROVIDER_SITE_OTHER): Payer: Medicare Other | Admitting: Internal Medicine

## 2014-12-08 VITALS — BP 112/72 | HR 88 | Ht 67.0 in | Wt 164.0 lb

## 2014-12-08 DIAGNOSIS — I4729 Other ventricular tachycardia: Secondary | ICD-10-CM

## 2014-12-08 DIAGNOSIS — Z95810 Presence of automatic (implantable) cardiac defibrillator: Secondary | ICD-10-CM

## 2014-12-08 DIAGNOSIS — I5022 Chronic systolic (congestive) heart failure: Secondary | ICD-10-CM | POA: Insufficient documentation

## 2014-12-08 DIAGNOSIS — I472 Ventricular tachycardia: Secondary | ICD-10-CM | POA: Diagnosis not present

## 2014-12-08 LAB — CUP PACEART INCLINIC DEVICE CHECK
Battery Voltage: 3.01 V
Brady Statistic AP VP Percent: 0.01 %
Brady Statistic AP VS Percent: 15.17 %
Brady Statistic AS VP Percent: 0.03 %
Brady Statistic RA Percent Paced: 15.18 %
HIGH POWER IMPEDANCE MEASURED VALUE: 171 Ohm
HIGH POWER IMPEDANCE MEASURED VALUE: 49 Ohm
HighPow Impedance: 62 Ohm
Lead Channel Pacing Threshold Amplitude: 0.75 V
Lead Channel Pacing Threshold Amplitude: 1 V
Lead Channel Pacing Threshold Pulse Width: 0.4 ms
Lead Channel Pacing Threshold Pulse Width: 0.4 ms
Lead Channel Sensing Intrinsic Amplitude: 13.875 mV
Lead Channel Setting Pacing Amplitude: 2.5 V
Lead Channel Setting Pacing Pulse Width: 0.4 ms
Lead Channel Setting Sensing Sensitivity: 0.3 mV
MDC IDC MSMT BATTERY REMAINING LONGEVITY: 112 mo
MDC IDC MSMT LEADCHNL RA IMPEDANCE VALUE: 475 Ohm
MDC IDC MSMT LEADCHNL RA SENSING INTR AMPL: 3 mV
MDC IDC MSMT LEADCHNL RV IMPEDANCE VALUE: 361 Ohm
MDC IDC SESS DTM: 20160531162507
MDC IDC SET LEADCHNL RA PACING AMPLITUDE: 2 V
MDC IDC SET ZONE DETECTION INTERVAL: 320 ms
MDC IDC STAT BRADY AS VS PERCENT: 84.79 %
MDC IDC STAT BRADY RV PERCENT PACED: 0.04 %
Zone Setting Detection Interval: 350 ms
Zone Setting Detection Interval: 360 ms
Zone Setting Detection Interval: 450 ms

## 2014-12-08 NOTE — Assessment & Plan Note (Signed)
She has had no recurrent VT since her last visit. Will follow.

## 2014-12-08 NOTE — Assessment & Plan Note (Signed)
Her medtronic ICD is working normally. Will recheck in several months.

## 2014-12-08 NOTE — Assessment & Plan Note (Signed)
She denies anginal symptoms. No change in meds. She is encouraged to increase her physical activity.

## 2014-12-08 NOTE — Progress Notes (Signed)
HPI Rachel Bates returns today for followup. She is a very pleasant 69 year old woman with an ischemic cardiomyopathy, chronic class II systolic heart failure, and ventricular tachycardia. The patient denies palpitations.  She has moved to be closer to her family. No problems with her meds. No edema. No chest pain or sob.  No Known Allergies   Current Outpatient Prescriptions  Medication Sig Dispense Refill  . aspirin 81 MG tablet Take 81 mg by mouth daily.      . B Complex-C-Folic Acid (STRESS FORMULA) TABS Take 1 tablet by mouth daily.     . Calcium Carbonate-Vitamin D (CALCIUM-D) 600-400 MG-UNIT TABS Take 1 tablet by mouth 2 (two) times daily.    . carvedilol (COREG) 25 MG tablet Take 25 mg by mouth 2 (two) times daily with a meal.      . Cholecalciferol (VITAMIN D-3) 1000 UNITS CAPS Take 1 capsule by mouth daily.    . Cyanocobalamin 2500 MCG TABS Take 1 tablet by mouth daily.      . diphenhydrAMINE (BENADRYL) 25 mg capsule Take 25 mg by mouth daily.    Marland Kitchen FIBER PO Take 1 capsule by mouth daily.    . furosemide (LASIX) 40 MG tablet Take 40 mg by mouth daily as needed for fluid or edema.     . Omega-3 Fatty Acids (FISH OIL) 1000 MG CAPS Take 1 capsule by mouth daily.     Marland Kitchen oxyCODONE-acetaminophen (PERCOCET/ROXICET) 5-325 MG per tablet Take 1 tablet by mouth every 4 (four) hours as needed for pain.    Marland Kitchen PARoxetine (PAXIL) 40 MG tablet Take 40 mg by mouth every morning.      . ramipril (ALTACE) 2.5 MG tablet Take 2.5 mg by mouth daily.      . simvastatin (ZOCOR) 40 MG tablet Take 40 mg by mouth at bedtime.      . temazepam (RESTORIL) 15 MG capsule Take 15 mg by mouth at bedtime.     . topiramate (TOPAMAX) 100 MG tablet Take 100 mg by mouth 2 (two) times daily.      . vitamin E 400 UNIT capsule Take 400 Units by mouth daily.     No current facility-administered medications for this visit.     Past Medical History  Diagnosis Date  . Dyslipidemia   . Ventricular tachycardia   .  Myocardial infarct   . Ischemic cardiomyopathy     ROS:   All systems reviewed and negative except as noted in the HPI.   Past Surgical History  Procedure Laterality Date  . Cardiac defibrillator placement    . Implantable cardioverter defibrillator generator change N/A 02/04/2013    Procedure: IMPLANTABLE CARDIOVERTER DEFIBRILLATOR GENERATOR CHANGE;  Surgeon: Evans Lance, MD;  Location: [facility] CATH LAB;  Service: Cardiovascular;  Laterality: N/A;     Family History  Problem Relation Age of Onset  . Hypertension Mother   . Thyroid disease Mother   . Alzheimer's disease Mother   . Coronary artery disease Father   . Pulmonary embolism Father   . Congestive Heart Failure Maternal Grandmother   . Hypertension Maternal Grandmother   . Heart attack Maternal Grandfather   . Other Maternal Grandfather     carotid disease  . Dementia Paternal Grandmother   . Other Paternal Grandfather 66    accident     History   Social History  . Marital Status: Married    Spouse Name: N/A  . Number of Children: N/A  . Years of Education: N/A  Occupational History  . Not on file.   Social History Main Topics  . Smoking status: Former Research scientist (life sciences)  . Smokeless tobacco: Not on file  . Alcohol Use: No  . Drug Use: No  . Sexual Activity: Not on file   Other Topics Concern  . Not on file   Social History Narrative     BP 112/72 mmHg  Pulse 88  Ht 5\' 7"  (1.702 m)  Wt 164 lb (74.39 kg)  BMI 25.68 kg/m2  Physical Exam:  Well appearing 69 year old woman, NAD HEENT: Unremarkable Neck:  No JVD, no thyromegally, a soft carotid bruit is present on the left Back:  No CVA tenderness Lungs:  Clear with no wheezes HEART:  Regular rate rhythm, no murmurs, no rubs, no clicks Abd:  soft, positive bowel sounds, no organomegally, no rebound, no guarding Ext:  2 plus pulses, no edema, no cyanosis, no clubbing Skin:  No rashes no nodules Neuro:  CN II through XII intact, motor grossly  intact   DEVICE  Normal function.  Assess/Plan:

## 2014-12-08 NOTE — Assessment & Plan Note (Signed)
Her symptoms remain class 2 and are well compensated. No change in meds.

## 2014-12-08 NOTE — Patient Instructions (Signed)
Medication Instructions:  Your physician recommends that you continue on your current medications as directed. Please refer to the Current Medication list given to you today.   Labwork: None ordered  Testing/Procedures: None ordered  Follow-Up: Your physician wants you to follow-up in: 12 months with Dr Knox Saliva will receive a reminder letter in the mail two months in advance. If you don't receive a letter, please call our office to schedule the follow-up appointment.  Remote monitoring is used to monitor your Pacemaker or ICD from home. This monitoring reduces the number of office visits required to check your device to one time per year. It allows [place] to keep an eye on the functioning of your device to ensure it is working properly. You are scheduled for a device check from home on 03/09/15. You may send your transmission at any time that day. If you have a wireless device, the transmission will be sent automatically. After your physician reviews your transmission, you will receive a postcard with your next transmission date.     Any Other Special Instructions Will Be Listed Below (If Applicable).

## 2014-12-14 DIAGNOSIS — E785 Hyperlipidemia, unspecified: Secondary | ICD-10-CM | POA: Diagnosis not present

## 2014-12-14 DIAGNOSIS — R69 Illness, unspecified: Secondary | ICD-10-CM | POA: Diagnosis not present

## 2014-12-14 DIAGNOSIS — Z0000 Encounter for general adult medical examination without abnormal findings: Secondary | ICD-10-CM | POA: Diagnosis not present

## 2014-12-14 DIAGNOSIS — Z79899 Other long term (current) drug therapy: Secondary | ICD-10-CM | POA: Diagnosis not present

## 2014-12-14 DIAGNOSIS — Z13820 Encounter for screening for osteoporosis: Secondary | ICD-10-CM | POA: Diagnosis not present

## 2014-12-14 DIAGNOSIS — Z1239 Encounter for other screening for malignant neoplasm of breast: Secondary | ICD-10-CM | POA: Diagnosis not present

## 2014-12-14 DIAGNOSIS — Z136 Encounter for screening for cardiovascular disorders: Secondary | ICD-10-CM | POA: Diagnosis not present

## 2014-12-14 DIAGNOSIS — Z1211 Encounter for screening for malignant neoplasm of colon: Secondary | ICD-10-CM | POA: Diagnosis not present

## 2014-12-14 DIAGNOSIS — I119 Hypertensive heart disease without heart failure: Secondary | ICD-10-CM | POA: Diagnosis not present

## 2014-12-14 DIAGNOSIS — D72819 Decreased white blood cell count, unspecified: Secondary | ICD-10-CM | POA: Diagnosis not present

## 2014-12-14 DIAGNOSIS — D696 Thrombocytopenia, unspecified: Secondary | ICD-10-CM | POA: Diagnosis not present

## 2014-12-14 DIAGNOSIS — E559 Vitamin D deficiency, unspecified: Secondary | ICD-10-CM | POA: Diagnosis not present

## 2014-12-14 DIAGNOSIS — F339 Major depressive disorder, recurrent, unspecified: Secondary | ICD-10-CM | POA: Diagnosis not present

## 2014-12-16 ENCOUNTER — Encounter: Payer: Self-pay | Admitting: Internal Medicine

## 2014-12-25 DIAGNOSIS — Z1231 Encounter for screening mammogram for malignant neoplasm of breast: Secondary | ICD-10-CM | POA: Diagnosis not present

## 2014-12-25 DIAGNOSIS — Z13820 Encounter for screening for osteoporosis: Secondary | ICD-10-CM | POA: Diagnosis not present

## 2014-12-25 DIAGNOSIS — M8589 Other specified disorders of bone density and structure, multiple sites: Secondary | ICD-10-CM | POA: Diagnosis not present

## 2015-01-04 ENCOUNTER — Other Ambulatory Visit: Payer: Self-pay

## 2015-02-10 DIAGNOSIS — G43009 Migraine without aura, not intractable, without status migrainosus: Secondary | ICD-10-CM | POA: Diagnosis not present

## 2015-03-09 ENCOUNTER — Ambulatory Visit (INDEPENDENT_AMBULATORY_CARE_PROVIDER_SITE_OTHER): Payer: Medicare Other | Admitting: *Deleted

## 2015-03-09 DIAGNOSIS — I5022 Chronic systolic (congestive) heart failure: Secondary | ICD-10-CM

## 2015-03-09 DIAGNOSIS — I472 Ventricular tachycardia, unspecified: Secondary | ICD-10-CM

## 2015-03-09 DIAGNOSIS — I4729 Other ventricular tachycardia: Secondary | ICD-10-CM

## 2015-03-09 NOTE — Progress Notes (Signed)
Remote ICD transmission.   

## 2015-03-11 ENCOUNTER — Telehealth: Payer: Self-pay | Admitting: Internal Medicine

## 2015-03-11 NOTE — Telephone Encounter (Signed)
LMOVM for pt informing pt that her wirex was just ordered and she should have it in 1-2 weeks.

## 2015-03-11 NOTE — Telephone Encounter (Signed)
New Message  Pt calling to speak w/ Device about equipment relating to her home monitor that has not arrived to her home. Pt following up; please call back and discuss.

## 2015-03-17 DIAGNOSIS — H2513 Age-related nuclear cataract, bilateral: Secondary | ICD-10-CM | POA: Diagnosis not present

## 2015-03-17 LAB — CUP PACEART REMOTE DEVICE CHECK
Battery Voltage: 3.01 V
Brady Statistic AP VP Percent: 0.01 %
Brady Statistic AS VP Percent: 0.04 %
Brady Statistic AS VS Percent: 89.09 %
HighPow Impedance: 49 Ohm
HighPow Impedance: 54 Ohm
Lead Channel Impedance Value: 361 Ohm
Lead Channel Pacing Threshold Amplitude: 1 V
Lead Channel Sensing Intrinsic Amplitude: 3.5 mV
Lead Channel Sensing Intrinsic Amplitude: 3.5 mV
Lead Channel Sensing Intrinsic Amplitude: 9.375 mV
Lead Channel Setting Pacing Amplitude: 2.5 V
MDC IDC MSMT BATTERY REMAINING LONGEVITY: 109 mo
MDC IDC MSMT LEADCHNL RA IMPEDANCE VALUE: 456 Ohm
MDC IDC MSMT LEADCHNL RA PACING THRESHOLD AMPLITUDE: 0.75 V
MDC IDC MSMT LEADCHNL RA PACING THRESHOLD PULSEWIDTH: 0.4 ms
MDC IDC MSMT LEADCHNL RV IMPEDANCE VALUE: 285 Ohm
MDC IDC MSMT LEADCHNL RV PACING THRESHOLD PULSEWIDTH: 0.4 ms
MDC IDC MSMT LEADCHNL RV SENSING INTR AMPL: 9.375 mV
MDC IDC SESS DTM: 20160830123847
MDC IDC SET LEADCHNL RA PACING AMPLITUDE: 2 V
MDC IDC SET LEADCHNL RV PACING PULSEWIDTH: 0.4 ms
MDC IDC SET LEADCHNL RV SENSING SENSITIVITY: 0.3 mV
MDC IDC SET ZONE DETECTION INTERVAL: 320 ms
MDC IDC SET ZONE DETECTION INTERVAL: 350 ms
MDC IDC SET ZONE DETECTION INTERVAL: 450 ms
MDC IDC STAT BRADY AP VS PERCENT: 10.87 %
MDC IDC STAT BRADY RA PERCENT PACED: 10.88 %
MDC IDC STAT BRADY RV PERCENT PACED: 0.04 %
Zone Setting Detection Interval: 360 ms

## 2015-03-24 ENCOUNTER — Encounter: Payer: Self-pay | Admitting: Cardiology

## 2015-04-01 ENCOUNTER — Encounter: Payer: Self-pay | Admitting: Internal Medicine

## 2015-04-09 ENCOUNTER — Encounter: Payer: Self-pay | Admitting: Cardiology

## 2015-04-19 DIAGNOSIS — Z23 Encounter for immunization: Secondary | ICD-10-CM | POA: Diagnosis not present

## 2015-04-23 DIAGNOSIS — H25812 Combined forms of age-related cataract, left eye: Secondary | ICD-10-CM | POA: Diagnosis not present

## 2015-05-27 DIAGNOSIS — Z87891 Personal history of nicotine dependence: Secondary | ICD-10-CM | POA: Diagnosis not present

## 2015-05-27 DIAGNOSIS — J449 Chronic obstructive pulmonary disease, unspecified: Secondary | ICD-10-CM | POA: Diagnosis not present

## 2015-05-27 DIAGNOSIS — Z801 Family history of malignant neoplasm of trachea, bronchus and lung: Secondary | ICD-10-CM | POA: Diagnosis not present

## 2015-05-27 DIAGNOSIS — Z122 Encounter for screening for malignant neoplasm of respiratory organs: Secondary | ICD-10-CM | POA: Diagnosis not present

## 2015-06-10 ENCOUNTER — Ambulatory Visit (INDEPENDENT_AMBULATORY_CARE_PROVIDER_SITE_OTHER): Payer: Medicare Other | Admitting: *Deleted

## 2015-06-10 ENCOUNTER — Telehealth: Payer: Self-pay | Admitting: Internal Medicine

## 2015-06-10 ENCOUNTER — Telehealth: Payer: Self-pay | Admitting: Cardiology

## 2015-06-10 DIAGNOSIS — I4729 Other ventricular tachycardia: Secondary | ICD-10-CM

## 2015-06-10 DIAGNOSIS — I472 Ventricular tachycardia: Secondary | ICD-10-CM

## 2015-06-10 DIAGNOSIS — I5022 Chronic systolic (congestive) heart failure: Secondary | ICD-10-CM

## 2015-06-10 NOTE — Telephone Encounter (Signed)
New message      Calling to let device clinic know that she sent her remote transmission.  Did you get it?

## 2015-06-10 NOTE — Telephone Encounter (Signed)
LMOVM for pt informing her that we did receive her transmission.

## 2015-06-10 NOTE — Telephone Encounter (Signed)
LMOVM reminding pt to send remote transmission.   

## 2015-06-11 NOTE — Progress Notes (Signed)
Remote ICD transmission.   

## 2015-06-21 DIAGNOSIS — G43909 Migraine, unspecified, not intractable, without status migrainosus: Secondary | ICD-10-CM | POA: Diagnosis not present

## 2015-06-21 LAB — CUP PACEART REMOTE DEVICE CHECK
Battery Remaining Longevity: 106 mo
Brady Statistic AP VS Percent: 16.16 %
Brady Statistic AS VS Percent: 83.8 %
HighPow Impedance: 43 Ohm
HighPow Impedance: 52 Ohm
Implantable Lead Implant Date: 20071003
Implantable Lead Location: 753860
Lead Channel Impedance Value: 456 Ohm
Lead Channel Pacing Threshold Amplitude: 0.625 V
Lead Channel Pacing Threshold Pulse Width: 0.4 ms
Lead Channel Pacing Threshold Pulse Width: 0.4 ms
Lead Channel Sensing Intrinsic Amplitude: 2.625 mV
MDC IDC LEAD IMPLANT DT: 20071019
MDC IDC LEAD LOCATION: 753859
MDC IDC MSMT BATTERY VOLTAGE: 3 V
MDC IDC MSMT LEADCHNL RA SENSING INTR AMPL: 2.625 mV
MDC IDC MSMT LEADCHNL RV IMPEDANCE VALUE: 304 Ohm
MDC IDC MSMT LEADCHNL RV IMPEDANCE VALUE: 342 Ohm
MDC IDC MSMT LEADCHNL RV PACING THRESHOLD AMPLITUDE: 1.125 V
MDC IDC MSMT LEADCHNL RV SENSING INTR AMPL: 7.625 mV
MDC IDC MSMT LEADCHNL RV SENSING INTR AMPL: 7.625 mV
MDC IDC SESS DTM: 20161201185141
MDC IDC SET LEADCHNL RA PACING AMPLITUDE: 2 V
MDC IDC SET LEADCHNL RV PACING AMPLITUDE: 2.5 V
MDC IDC SET LEADCHNL RV PACING PULSEWIDTH: 0.4 ms
MDC IDC SET LEADCHNL RV SENSING SENSITIVITY: 0.3 mV
MDC IDC STAT BRADY AP VP PERCENT: 0.01 %
MDC IDC STAT BRADY AS VP PERCENT: 0.03 %
MDC IDC STAT BRADY RA PERCENT PACED: 16.17 %
MDC IDC STAT BRADY RV PERCENT PACED: 0.05 %

## 2015-06-22 ENCOUNTER — Encounter: Payer: Self-pay | Admitting: Cardiology

## 2015-07-06 ENCOUNTER — Encounter: Payer: Self-pay | Admitting: Cardiology

## 2015-07-26 DIAGNOSIS — Z79899 Other long term (current) drug therapy: Secondary | ICD-10-CM | POA: Diagnosis not present

## 2015-07-26 DIAGNOSIS — G43909 Migraine, unspecified, not intractable, without status migrainosus: Secondary | ICD-10-CM | POA: Diagnosis not present

## 2015-09-09 ENCOUNTER — Telehealth: Payer: Self-pay | Admitting: Cardiology

## 2015-09-09 ENCOUNTER — Ambulatory Visit (INDEPENDENT_AMBULATORY_CARE_PROVIDER_SITE_OTHER): Payer: Medicare Other | Admitting: *Deleted

## 2015-09-09 DIAGNOSIS — Z95810 Presence of automatic (implantable) cardiac defibrillator: Secondary | ICD-10-CM

## 2015-09-09 DIAGNOSIS — I472 Ventricular tachycardia: Secondary | ICD-10-CM | POA: Diagnosis not present

## 2015-09-09 DIAGNOSIS — I5022 Chronic systolic (congestive) heart failure: Secondary | ICD-10-CM | POA: Diagnosis not present

## 2015-09-09 DIAGNOSIS — I4729 Other ventricular tachycardia: Secondary | ICD-10-CM

## 2015-09-09 NOTE — Telephone Encounter (Signed)
LMOVM reminding pt to send remote transmission.   

## 2015-09-10 NOTE — Progress Notes (Signed)
Remote ICD transmission.   

## 2015-09-17 LAB — CUP PACEART REMOTE DEVICE CHECK
Battery Remaining Longevity: 102 mo
Battery Voltage: 3 V
Brady Statistic AP VP Percent: 0.01 %
Brady Statistic AP VS Percent: 11.16 %
Brady Statistic AS VP Percent: 0.03 %
Brady Statistic AS VS Percent: 88.8 %
Brady Statistic RA Percent Paced: 11.17 %
Date Time Interrogation Session: 20170302172650
HIGH POWER IMPEDANCE MEASURED VALUE: 45 Ohm
HIGH POWER IMPEDANCE MEASURED VALUE: 60 Ohm
Implantable Lead Location: 753859
Lead Channel Impedance Value: 285 Ohm
Lead Channel Impedance Value: 361 Ohm
Lead Channel Sensing Intrinsic Amplitude: 2.125 mV
Lead Channel Sensing Intrinsic Amplitude: 7.125 mV
Lead Channel Sensing Intrinsic Amplitude: 7.125 mV
Lead Channel Setting Pacing Amplitude: 2 V
Lead Channel Setting Pacing Pulse Width: 0.4 ms
Lead Channel Setting Sensing Sensitivity: 0.3 mV
MDC IDC LEAD IMPLANT DT: 20071003
MDC IDC LEAD IMPLANT DT: 20071019
MDC IDC LEAD LOCATION: 753860
MDC IDC MSMT LEADCHNL RA IMPEDANCE VALUE: 456 Ohm
MDC IDC MSMT LEADCHNL RA PACING THRESHOLD AMPLITUDE: 0.625 V
MDC IDC MSMT LEADCHNL RA PACING THRESHOLD PULSEWIDTH: 0.4 ms
MDC IDC MSMT LEADCHNL RA SENSING INTR AMPL: 2.125 mV
MDC IDC MSMT LEADCHNL RV PACING THRESHOLD AMPLITUDE: 1.125 V
MDC IDC MSMT LEADCHNL RV PACING THRESHOLD PULSEWIDTH: 0.4 ms
MDC IDC SET LEADCHNL RV PACING AMPLITUDE: 2.5 V
MDC IDC STAT BRADY RV PERCENT PACED: 0.04 %

## 2015-09-17 NOTE — Progress Notes (Signed)
Patient ID: Rachel Bates, female   DOB: Apr 07, 1946, 70 y.o.   MRN: [id number] Normal remote reviewed.  Next follow up 11/2015 in clinic

## 2015-09-20 DIAGNOSIS — G43909 Migraine, unspecified, not intractable, without status migrainosus: Secondary | ICD-10-CM | POA: Diagnosis not present

## 2015-09-22 ENCOUNTER — Encounter: Payer: Self-pay | Admitting: Cardiology

## 2015-09-28 DIAGNOSIS — G43009 Migraine without aura, not intractable, without status migrainosus: Secondary | ICD-10-CM | POA: Diagnosis not present

## 2015-09-29 DIAGNOSIS — Z79899 Other long term (current) drug therapy: Secondary | ICD-10-CM | POA: Diagnosis not present

## 2015-09-29 DIAGNOSIS — R32 Unspecified urinary incontinence: Secondary | ICD-10-CM | POA: Diagnosis not present

## 2015-09-29 DIAGNOSIS — I2510 Atherosclerotic heart disease of native coronary artery without angina pectoris: Secondary | ICD-10-CM | POA: Diagnosis not present

## 2015-09-29 DIAGNOSIS — Z87898 Personal history of other specified conditions: Secondary | ICD-10-CM | POA: Diagnosis not present

## 2015-09-29 DIAGNOSIS — E782 Mixed hyperlipidemia: Secondary | ICD-10-CM | POA: Diagnosis not present

## 2015-09-29 DIAGNOSIS — F329 Major depressive disorder, single episode, unspecified: Secondary | ICD-10-CM | POA: Diagnosis not present

## 2015-10-06 ENCOUNTER — Encounter: Payer: Self-pay | Admitting: Cardiology

## 2015-10-18 DIAGNOSIS — H25812 Combined forms of age-related cataract, left eye: Secondary | ICD-10-CM | POA: Diagnosis not present

## 2015-10-20 DIAGNOSIS — N390 Urinary tract infection, site not specified: Secondary | ICD-10-CM | POA: Diagnosis not present

## 2015-10-20 DIAGNOSIS — R32 Unspecified urinary incontinence: Secondary | ICD-10-CM | POA: Diagnosis not present

## 2015-10-26 ENCOUNTER — Telehealth: Payer: Self-pay | Admitting: Internal Medicine

## 2015-10-26 NOTE — Telephone Encounter (Signed)
Discussed with Dr Lovena Le, and it is okay to take.  She is aware

## 2015-10-26 NOTE — Telephone Encounter (Signed)
New message      Pt has a bladder infection.  The urologist called in levofloxacin 250mg  daily for 3 days.  Pt was reading the insert and it said it can cause a prolong QT interveral.  Is this ok to take?

## 2015-11-02 DIAGNOSIS — H25812 Combined forms of age-related cataract, left eye: Secondary | ICD-10-CM | POA: Diagnosis not present

## 2015-11-02 DIAGNOSIS — G4733 Obstructive sleep apnea (adult) (pediatric): Secondary | ICD-10-CM | POA: Diagnosis not present

## 2015-11-02 DIAGNOSIS — I252 Old myocardial infarction: Secondary | ICD-10-CM | POA: Diagnosis not present

## 2015-11-02 DIAGNOSIS — Z950 Presence of cardiac pacemaker: Secondary | ICD-10-CM | POA: Diagnosis not present

## 2015-11-02 DIAGNOSIS — I509 Heart failure, unspecified: Secondary | ICD-10-CM | POA: Diagnosis not present

## 2015-11-02 DIAGNOSIS — Z86711 Personal history of pulmonary embolism: Secondary | ICD-10-CM | POA: Diagnosis not present

## 2015-11-02 DIAGNOSIS — E785 Hyperlipidemia, unspecified: Secondary | ICD-10-CM | POA: Diagnosis not present

## 2015-11-02 DIAGNOSIS — Z955 Presence of coronary angioplasty implant and graft: Secondary | ICD-10-CM | POA: Diagnosis not present

## 2015-11-02 DIAGNOSIS — Z87891 Personal history of nicotine dependence: Secondary | ICD-10-CM | POA: Diagnosis not present

## 2015-11-02 DIAGNOSIS — I110 Hypertensive heart disease with heart failure: Secondary | ICD-10-CM | POA: Diagnosis not present

## 2015-11-02 DIAGNOSIS — Z8719 Personal history of other diseases of the digestive system: Secondary | ICD-10-CM | POA: Diagnosis not present

## 2015-11-02 DIAGNOSIS — Z79899 Other long term (current) drug therapy: Secondary | ICD-10-CM | POA: Diagnosis not present

## 2015-11-02 DIAGNOSIS — I2510 Atherosclerotic heart disease of native coronary artery without angina pectoris: Secondary | ICD-10-CM | POA: Diagnosis not present

## 2015-11-02 DIAGNOSIS — Z951 Presence of aortocoronary bypass graft: Secondary | ICD-10-CM | POA: Diagnosis not present

## 2015-11-02 DIAGNOSIS — Z7982 Long term (current) use of aspirin: Secondary | ICD-10-CM | POA: Diagnosis not present

## 2015-11-10 DIAGNOSIS — H25811 Combined forms of age-related cataract, right eye: Secondary | ICD-10-CM | POA: Diagnosis not present

## 2015-11-10 DIAGNOSIS — H2511 Age-related nuclear cataract, right eye: Secondary | ICD-10-CM | POA: Diagnosis not present

## 2015-11-29 DIAGNOSIS — H524 Presbyopia: Secondary | ICD-10-CM | POA: Diagnosis not present

## 2015-12-08 ENCOUNTER — Encounter: Payer: Self-pay | Admitting: Internal Medicine

## 2015-12-08 ENCOUNTER — Ambulatory Visit (INDEPENDENT_AMBULATORY_CARE_PROVIDER_SITE_OTHER): Payer: Medicare Other | Admitting: Internal Medicine

## 2015-12-08 VITALS — BP 134/84 | HR 84 | Ht 67.0 in | Wt 173.0 lb

## 2015-12-08 DIAGNOSIS — I472 Ventricular tachycardia, unspecified: Secondary | ICD-10-CM

## 2015-12-08 LAB — CUP PACEART INCLINIC DEVICE CHECK
Brady Statistic AP VP Percent: 0.01 %
Brady Statistic AS VP Percent: 0.03 %
Brady Statistic AS VS Percent: 87.29 %
Date Time Interrogation Session: 20170531101931
HIGH POWER IMPEDANCE MEASURED VALUE: 66 Ohm
HighPow Impedance: 51 Ohm
Implantable Lead Implant Date: 20071019
Lead Channel Pacing Threshold Amplitude: 1 V
Lead Channel Sensing Intrinsic Amplitude: 9.8 mV
Lead Channel Setting Pacing Amplitude: 2 V
Lead Channel Setting Pacing Amplitude: 2.5 V
Lead Channel Setting Pacing Pulse Width: 0.4 ms
Lead Channel Setting Sensing Sensitivity: 0.3 mV
MDC IDC LEAD IMPLANT DT: 20071003
MDC IDC LEAD LOCATION: 753859
MDC IDC LEAD LOCATION: 753860
MDC IDC MSMT BATTERY REMAINING LONGEVITY: 98 mo
MDC IDC MSMT BATTERY VOLTAGE: 3 V
MDC IDC MSMT LEADCHNL RA IMPEDANCE VALUE: 475 Ohm
MDC IDC MSMT LEADCHNL RA PACING THRESHOLD AMPLITUDE: 0.625 V
MDC IDC MSMT LEADCHNL RA PACING THRESHOLD PULSEWIDTH: 0.4 ms
MDC IDC MSMT LEADCHNL RA SENSING INTR AMPL: 2.5 mV
MDC IDC MSMT LEADCHNL RV IMPEDANCE VALUE: 285 Ohm
MDC IDC MSMT LEADCHNL RV IMPEDANCE VALUE: 361 Ohm
MDC IDC MSMT LEADCHNL RV PACING THRESHOLD PULSEWIDTH: 0.4 ms
MDC IDC STAT BRADY AP VS PERCENT: 12.67 %
MDC IDC STAT BRADY RA PERCENT PACED: 12.68 %
MDC IDC STAT BRADY RV PERCENT PACED: 0.04 %

## 2015-12-08 NOTE — Progress Notes (Signed)
HPI [name] returns today for followup. She is a very pleasant 70 year old woman with an ischemic cardiomyopathy, chronic class II systolic heart failure, and ventricular tachycardia. The patient denies palpitations.  She has moved to be closer to her family. She has had problems with dizziness and notes that after she take her coreg, her blood pressure is very low, down in the 60's and 70's. She did not take her meds today.  No Known Allergies   Current Outpatient Prescriptions  Medication Sig Dispense Refill  . aspirin 81 MG tablet Take 81 mg by mouth daily.      . B Complex-C-Folic Acid (STRESS FORMULA) TABS Take 1 tablet by mouth daily.     . Calcium Carbonate-Vitamin D (CALCIUM-D) 600-400 MG-UNIT TABS Take 1 tablet by mouth 2 (two) times daily.    . carvedilol (COREG) 25 MG tablet Take 25 mg by mouth 2 (two) times daily with a meal.      . Cholecalciferol (VITAMIN D-3) 1000 UNITS CAPS Take 1 capsule by mouth daily.    . Cyanocobalamin 2500 MCG TABS Take 1 tablet by mouth daily.      . diphenhydrAMINE (BENADRYL) 25 mg capsule Take 25 mg by mouth daily.    Marland Kitchen FIBER PO Take 1 capsule by mouth daily.    . furosemide (LASIX) 40 MG tablet Take 40 mg by mouth daily as needed for fluid or edema.     . Omega-3 Fatty Acids (FISH OIL) 1000 MG CAPS Take 1 capsule by mouth daily.     Marland Kitchen oxyCODONE-acetaminophen (PERCOCET/ROXICET) 5-325 MG per tablet Take 1 tablet by mouth every 4 (four) hours as needed for pain.    Marland Kitchen PARoxetine (PAXIL) 40 MG tablet Take 40 mg by mouth every morning.      . ramipril (ALTACE) 2.5 MG tablet Take 2.5 mg by mouth daily.      . simvastatin (ZOCOR) 40 MG tablet Take 40 mg by mouth at bedtime.      . temazepam (RESTORIL) 15 MG capsule Take 15 mg by mouth at bedtime.     . topiramate (TOPAMAX) 100 MG tablet Take 100 mg by mouth 2 (two) times daily.      . vitamin E 400 UNIT capsule Take 400 Units by mouth daily.     No current facility-administered medications for this  visit.     Past Medical History  Diagnosis Date  . Dyslipidemia   . Ventricular tachycardia ([place])   . Myocardial infarct ([place])   . Ischemic cardiomyopathy     ROS:   All systems reviewed and negative except as noted in the HPI.   Past Surgical History  Procedure Laterality Date  . Cardiac defibrillator placement    . Implantable cardioverter defibrillator generator change N/A 02/04/2013    Procedure: IMPLANTABLE CARDIOVERTER DEFIBRILLATOR GENERATOR CHANGE;  Surgeon: Evans Lance, MD;  Location: [facility] C[facility] LAB;  Service: Cardiovascular;  Laterality: N/A;     Family History  Problem Relation Age of Onset  . Hypertension Mother   . Thyroid disease Mother   . Alzheimer's disease Mother   . Coronary artery disease Father   . Pulmonary embolism Father   . Congestive Heart Failure Maternal Grandmother   . Hypertension Maternal Grandmother   . Heart attack Maternal Grandfather   . Other Maternal Grandfather     carotid disease  . Dementia Paternal Grandmother   . Other Paternal Grandfather 45    accident     Social History   Social  History  . Marital Status: Married    Spouse Name: N/A  . Number of Children: N/A  . Years of Education: N/A   Occupational History  . Not on file.   Social History Main Topics  . Smoking status: Former Research scientist (life sciences)  . Smokeless tobacco: Not on file  . Alcohol Use: No  . Drug Use: No  . Sexual Activity: Not on file   Other Topics Concern  . Not on file   Social History Narrative     BP 134/84 mmHg  Pulse 84  Ht 5\' 7"  (1.702 m)  Wt 173 lb (78.472 kg)  BMI 27.09 kg/m2  Physical Exam:  Well appearing 70 year old woman, NAD HEENT: Unremarkable Neck:  No JVD, no thyromegally, a soft carotid bruit is present on the left Back:  No CVA tenderness Lungs:  Clear with no wheezes HEART:  Regular rate rhythm, no murmurs, no rubs, no clicks Abd:  soft, positive bowel sounds, no organomegally, no rebound, no guarding Ext:  2 plus pulses,  no edema, no cyanosis, no clubbing Skin:  No rashes no nodules Neuro:  CN II through XII intact, motor grossly intact   DEVICE  Normal function.  A/P 1. VT - she has had no recurrent episodes 2. Chronic systolic heart failure -she is class 2. She is sedentary and I have asked her to increase her physical activity. Because her blood pressure is dropping, she will reduce her coreg 3. 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 Will follow. 4. Dyslipidemia - she will continue her statin therapy.   Gregg Taylor,M.D.  Assess/Plan:

## 2015-12-08 NOTE — Patient Instructions (Signed)
Medication Instructions:  Your physician has recommended you make the following change in your medication:  1) Decrease Carvedilol to 12.5 mg twice daily    Labwork: None ordered   Testing/Procedures: None ordered   Follow-Up: Your physician wants you to follow-up in: 12 months with Dr Knox Saliva will receive a reminder letter in the mail two months in advance. If you don't receive a letter, please call our office to schedule the follow-up appointment.  Remote monitoring is used to monitor your  ICD from home. This monitoring reduces the number of office visits required to check your device to one time per year. It allows [place] to keep an eye on the functioning of your device to ensure it is working properly. You are scheduled for a device check from home on 03/08/16. You may send your transmission at any time that day. If you have a wireless device, the transmission will be sent automatically. After your physician reviews your transmission, you will receive a postcard with your next transmission date.     Any Other Special Instructions Will Be Listed Below (If Applicable).     If you need a refill on your cardiac medications before your next appointment, please call your pharmacy.

## 2016-01-10 DIAGNOSIS — F330 Major depressive disorder, recurrent, mild: Secondary | ICD-10-CM | POA: Diagnosis not present

## 2016-01-10 DIAGNOSIS — Z1211 Encounter for screening for malignant neoplasm of colon: Secondary | ICD-10-CM | POA: Diagnosis not present

## 2016-01-10 DIAGNOSIS — Z136 Encounter for screening for cardiovascular disorders: Secondary | ICD-10-CM | POA: Diagnosis not present

## 2016-01-10 DIAGNOSIS — E669 Obesity, unspecified: Secondary | ICD-10-CM | POA: Diagnosis not present

## 2016-01-10 DIAGNOSIS — Z6827 Body mass index (BMI) 27.0-27.9, adult: Secondary | ICD-10-CM | POA: Diagnosis not present

## 2016-01-10 DIAGNOSIS — Z1239 Encounter for other screening for malignant neoplasm of breast: Secondary | ICD-10-CM | POA: Diagnosis not present

## 2016-01-10 DIAGNOSIS — R32 Unspecified urinary incontinence: Secondary | ICD-10-CM | POA: Diagnosis not present

## 2016-01-10 DIAGNOSIS — Z1389 Encounter for screening for other disorder: Secondary | ICD-10-CM | POA: Diagnosis not present

## 2016-01-10 DIAGNOSIS — Z0000 Encounter for general adult medical examination without abnormal findings: Secondary | ICD-10-CM | POA: Diagnosis not present

## 2016-01-10 DIAGNOSIS — Z79899 Other long term (current) drug therapy: Secondary | ICD-10-CM | POA: Diagnosis not present

## 2016-01-10 DIAGNOSIS — Z9181 History of falling: Secondary | ICD-10-CM | POA: Diagnosis not present

## 2016-01-10 DIAGNOSIS — Z1159 Encounter for screening for other viral diseases: Secondary | ICD-10-CM | POA: Diagnosis not present

## 2016-01-10 DIAGNOSIS — I2510 Atherosclerotic heart disease of native coronary artery without angina pectoris: Secondary | ICD-10-CM | POA: Diagnosis not present

## 2016-01-10 DIAGNOSIS — E782 Mixed hyperlipidemia: Secondary | ICD-10-CM | POA: Diagnosis not present

## 2016-01-14 DIAGNOSIS — Z1231 Encounter for screening mammogram for malignant neoplasm of breast: Secondary | ICD-10-CM | POA: Diagnosis not present

## 2016-03-27 DIAGNOSIS — Z23 Encounter for immunization: Secondary | ICD-10-CM | POA: Diagnosis not present

## 2016-05-03 DIAGNOSIS — G43009 Migraine without aura, not intractable, without status migrainosus: Secondary | ICD-10-CM | POA: Diagnosis not present

## 2016-05-30 DIAGNOSIS — H26493 Other secondary cataract, bilateral: Secondary | ICD-10-CM | POA: Diagnosis not present

## 2016-09-25 DIAGNOSIS — F339 Major depressive disorder, recurrent, unspecified: Secondary | ICD-10-CM | POA: Diagnosis not present

## 2016-09-25 DIAGNOSIS — J101 Influenza due to other identified influenza virus with other respiratory manifestations: Secondary | ICD-10-CM | POA: Diagnosis not present

## 2016-09-25 DIAGNOSIS — R05 Cough: Secondary | ICD-10-CM | POA: Diagnosis not present

## 2016-09-26 DIAGNOSIS — R05 Cough: Secondary | ICD-10-CM | POA: Diagnosis not present

## 2016-10-10 ENCOUNTER — Encounter: Payer: Self-pay | Admitting: Cardiology

## 2016-10-12 DIAGNOSIS — F339 Major depressive disorder, recurrent, unspecified: Secondary | ICD-10-CM | POA: Diagnosis not present

## 2016-10-12 DIAGNOSIS — Z79899 Other long term (current) drug therapy: Secondary | ICD-10-CM | POA: Diagnosis not present

## 2016-10-12 DIAGNOSIS — R32 Unspecified urinary incontinence: Secondary | ICD-10-CM | POA: Diagnosis not present

## 2016-10-12 DIAGNOSIS — E782 Mixed hyperlipidemia: Secondary | ICD-10-CM | POA: Diagnosis not present

## 2016-10-12 DIAGNOSIS — I2510 Atherosclerotic heart disease of native coronary artery without angina pectoris: Secondary | ICD-10-CM | POA: Diagnosis not present

## 2016-10-31 DIAGNOSIS — G43009 Migraine without aura, not intractable, without status migrainosus: Secondary | ICD-10-CM | POA: Diagnosis not present

## 2016-12-11 ENCOUNTER — Telehealth: Payer: Self-pay

## 2016-12-11 ENCOUNTER — Encounter: Payer: Self-pay | Admitting: Internal Medicine

## 2016-12-11 ENCOUNTER — Ambulatory Visit (INDEPENDENT_AMBULATORY_CARE_PROVIDER_SITE_OTHER): Payer: Medicare Other | Admitting: Internal Medicine

## 2016-12-11 VITALS — BP 122/76 | HR 91 | Ht 67.0 in | Wt 168.0 lb

## 2016-12-11 DIAGNOSIS — I5022 Chronic systolic (congestive) heart failure: Secondary | ICD-10-CM | POA: Diagnosis not present

## 2016-12-11 DIAGNOSIS — I472 Ventricular tachycardia, unspecified: Secondary | ICD-10-CM

## 2016-12-11 DIAGNOSIS — Z95810 Presence of automatic (implantable) cardiac defibrillator: Secondary | ICD-10-CM

## 2016-12-11 DIAGNOSIS — Z79899 Other long term (current) drug therapy: Secondary | ICD-10-CM

## 2016-12-11 LAB — CUP PACEART INCLINIC DEVICE CHECK
Battery Voltage: 2.99 V
Brady Statistic AP VS Percent: 13.21 %
Brady Statistic AS VP Percent: 0.04 %
Brady Statistic RA Percent Paced: 12.93 %
Date Time Interrogation Session: 20180604155932
HIGH POWER IMPEDANCE MEASURED VALUE: 56 Ohm
HighPow Impedance: 69 Ohm
Implantable Lead Implant Date: 20071019
Implantable Lead Location: 753860
Implantable Lead Model: 6947
Lead Channel Impedance Value: 304 Ohm
Lead Channel Impedance Value: 475 Ohm
Lead Channel Sensing Intrinsic Amplitude: 13.125 mV
Lead Channel Setting Pacing Amplitude: 2 V
Lead Channel Setting Pacing Amplitude: 2.5 V
Lead Channel Setting Sensing Sensitivity: 0.3 mV
MDC IDC LEAD IMPLANT DT: 20071003
MDC IDC LEAD LOCATION: 753859
MDC IDC MSMT BATTERY REMAINING LONGEVITY: 83 mo
MDC IDC MSMT LEADCHNL RA SENSING INTR AMPL: 3.25 mV
MDC IDC MSMT LEADCHNL RV IMPEDANCE VALUE: 399 Ohm
MDC IDC PG IMPLANT DT: 20140729
MDC IDC SET LEADCHNL RV PACING PULSEWIDTH: 0.4 ms
MDC IDC STAT BRADY AP VP PERCENT: 0.01 %
MDC IDC STAT BRADY AS VS PERCENT: 86.73 %
MDC IDC STAT BRADY RV PERCENT PACED: 0.05 %

## 2016-12-11 LAB — BASIC METABOLIC PANEL
BUN / CREAT RATIO: 13 (ref 12–28)
BUN: 11 mg/dL (ref 8–27)
CO2: 22 mmol/L (ref 18–29)
CREATININE: 0.86 mg/dL (ref 0.57–1.00)
Calcium: 9.7 mg/dL (ref 8.7–10.3)
Chloride: 104 mmol/L (ref 96–106)
GFR, EST AFRICAN AMERICAN: 79 mL/min/{1.73_m2} (ref >59)
GFR, EST NON AFRICAN AMERICAN: 68 mL/min/{1.73_m2} (ref >59)
Glucose: 95 mg/dL (ref 65–99)
Potassium: 3.8 mmol/L (ref 3.5–5.2)
SODIUM: 143 mmol/L (ref 134–144)

## 2016-12-11 MED ORDER — FUROSEMIDE 40 MG PO TABS: 40.0000 mg | ORAL_TABLET | Freq: Every day | ORAL | 6 refills | Status: DC

## 2016-12-11 MED ORDER — APIXABAN 5 MG PO TABS: 5.0000 mg | ORAL_TABLET | Freq: Two times a day (BID) | ORAL | 3 refills | Status: DC

## 2016-12-11 NOTE — Patient Instructions (Addendum)
Medication Instructions:  START Eliquis 5 mg twice a day STOP Aspirin  Labwork: None Ordered   Testing/Procedures: None Ordered   Follow-Up: Your physician wants you to follow-up in: 1 year with Dr. Lovena Le. You will receive a reminder letter in the mail two months in advance. If you don't receive a letter, please call our office to schedule the follow-up appointment.  Remote monitoring is used to monitor your ICD from home. This monitoring reduces the number of office visits required to check your device to one time per year. It allows [place] to keep an eye on the functioning of your device to ensure it is working properly. You are scheduled for a device check from home on  03/13/17 . You may send your transmission at any time that day. If you have a wireless device, the transmission will be sent automatically. After your physician reviews your transmission, you will receive a postcard with your next transmission date.    Any Other Special Instructions Will Be Listed Below (If Applicable).     If you need a refill on your cardiac medications before your next appointment, please call your pharmacy.

## 2016-12-11 NOTE — Progress Notes (Signed)
HPI  Mrs. Espana returns today for followup. She is a very pleasant 71 year old woman with an ischemic cardiomyopathy, chronic class II systolic heart failure, and ventricular tachycardia. The patient denies palpitations.  No edema. No ICD shock. No muscle aches or pains.   Allergies  Allergen Reactions  . Sumatriptan Succinate Other (See Comments)    Chest pain, no triptans, pt states "it makes my heart race"  . Amitriptyline Other (See Comments)    Weight gain     Current Outpatient Prescriptions  Medication Sig Dispense Refill  . B Complex-C-Folic Acid (STRESS FORMULA) TABS Take 1 tablet by mouth daily.     . Calcium Carbonate-Vitamin D (CALCIUM-D) 600-400 MG-UNIT TABS Take 1 tablet by mouth 2 (two) times daily.    . carvedilol (COREG) 25 MG tablet Take 12.5 mg by mouth 2 (two) times daily with a meal.    . Cholecalciferol (VITAMIN D-3) 1000 UNITS CAPS Take 1 capsule by mouth daily.    . Cyanocobalamin 2500 MCG TABS Take 1 tablet by mouth daily.      . diphenhydrAMINE (BENADRYL) 25 mg capsule Take 25 mg by mouth daily.    Marland Kitchen FIBER PO Take 1 capsule by mouth daily.    . Omega-3 Fatty Acids (FISH OIL) 1000 MG CAPS Take 1 capsule by mouth daily.     Marland Kitchen oxyCODONE-acetaminophen (PERCOCET/ROXICET) 5-325 MG per tablet Take 1 tablet by mouth every 4 (four) hours as needed for pain.    . ramipril (ALTACE) 2.5 MG tablet Take 2.5 mg by mouth daily.      . sertraline (ZOLOFT) 100 MG tablet Take 100 mg by mouth at bedtime.    . simvastatin (ZOCOR) 40 MG tablet Take 40 mg by mouth at bedtime.      . temazepam (RESTORIL) 15 MG capsule Take 15 mg by mouth at bedtime.     . topiramate (TOPAMAX) 100 MG tablet Take 100 mg by mouth 2 (two) times daily.      . vitamin E 400 UNIT capsule Take 400 Units by mouth daily.    Marland Kitchen apixaban (ELIQUIS) 5 MG TABS tablet Take 1 tablet (5 mg total) by mouth 2 (two) times daily. 180 tablet 3   No current facility-administered medications for this visit.      Past  Medical History:  Diagnosis Date  . Dyslipidemia   . Ischemic cardiomyopathy   . Myocardial infarct ([place])   . Ventricular tachycardia ([place])     ROS:   All systems reviewed and negative except as noted in the HPI.   Past Surgical History:  Procedure Laterality Date  . CARDIAC DEFIBRILLATOR PLACEMENT    . IMPLANTABLE CARDIOVERTER DEFIBRILLATOR GENERATOR CHANGE N/A 02/04/2013   Procedure: IMPLANTABLE CARDIOVERTER DEFIBRILLATOR GENERATOR CHANGE;  Surgeon: Evans Lance, MD;  Location: [facility] CATH LAB;  Service: Cardiovascular;  Laterality: N/A;     Family History  Problem Relation Age of Onset  . Hypertension Mother   . Thyroid disease Mother   . Alzheimer's disease Mother   . Coronary artery disease Father   . Pulmonary embolism Father   . Congestive Heart Failure Maternal Grandmother   . Hypertension Maternal Grandmother   . Heart attack Maternal Grandfather   . Other Maternal Grandfather        carotid disease  . Dementia Paternal Grandmother   . Other Paternal Grandfather 63       accident     Social History   Social History  . Marital status: Married  Spouse name: N/A  . Number of children: N/A  . Years of education: N/A   Occupational History  . Not on file.   Social History Main Topics  . Smoking status: Former Research scientist (life sciences)  . Smokeless tobacco: Never Used  . Alcohol use No  . Drug use: No  . Sexual activity: Not on file   Other Topics Concern  . Not on file   Social History Narrative  . No narrative on file     BP 122/76   Pulse 91   Ht 5\' 7"  (1.702 m)   Wt 168 lb (76.2 kg)   SpO2 97%   BMI 26.31 kg/m   Physical Exam:  Well appearing 71 year old woman, NAD HEENT: Unremarkable Neck:  No JVD, no thyromegally, a soft carotid bruit is present on the left Back:  No CVA tenderness Lungs:  Clear with no wheezes HEART:  Regular rate rhythm, no murmurs, no rubs, no clicks Abd:  soft, positive bowel sounds, no organomegally, no rebound, no  guarding Ext:  2 plus pulses, no edema, no cyanosis, no clubbing Skin:  No rashes no nodules Neuro:  CN II through XII intact, motor grossly intact   DEVICE  Normal function. PAF has been observed.  A/P 1. VT - she has had no recurrent episodes. No change in her meds. 2. Chronic systolic heart failure -she is class 2. She is sedentary and I have asked her to increase her physical activity. Because her blood pressure is dropping, she will reduce her coreg 3. 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 Will follow. 4. PAF - she had an almost 2 hour episode a few months ago. She has had none since. We discussed the treatment options with the patient and with her CHADSVASC score of 4, we will attempt to start Eliquis.   Mikle Bosworth.D.

## 2016-12-11 NOTE — Telephone Encounter (Signed)
Called, spoke with pt. Informed pt Rx for Lasix 40 mg daily was sent in to [facility]. Did not get a chance to review this in the office. Pt verbalized understanding.

## 2016-12-15 ENCOUNTER — Telehealth: Payer: Self-pay

## 2016-12-15 NOTE — Telephone Encounter (Signed)
Called to review recent lab results with pt. Pt inquired about precautions of giving blood if on Eliquis. Informed I will forward to our Pharmacy and call pt back.

## 2016-12-21 NOTE — Telephone Encounter (Signed)
Called, spoke with pt. Informed Dr. Lovena Le stated okay to hold Eliquis for blood donation. Spoke with Fuller Canada, PharmD for our office. She stated pt would have to check with blood donation site for washout guidelines. Typically it is anywhere from 2-7 days. If holding longer than 2 days, call our office - it will be forwarded to Dr. Lovena Le to advise. Pt verbalized understanding.

## 2017-03-13 ENCOUNTER — Ambulatory Visit (INDEPENDENT_AMBULATORY_CARE_PROVIDER_SITE_OTHER): Payer: Medicare Other | Admitting: *Deleted

## 2017-03-13 DIAGNOSIS — I472 Ventricular tachycardia, unspecified: Secondary | ICD-10-CM

## 2017-03-14 DIAGNOSIS — B351 Tinea unguium: Secondary | ICD-10-CM | POA: Diagnosis not present

## 2017-03-14 DIAGNOSIS — M545 Low back pain: Secondary | ICD-10-CM | POA: Diagnosis not present

## 2017-03-15 DIAGNOSIS — Z23 Encounter for immunization: Secondary | ICD-10-CM | POA: Diagnosis not present

## 2017-03-15 NOTE — Progress Notes (Signed)
Remote ICD transmission.   

## 2017-03-20 LAB — CUP PACEART REMOTE DEVICE CHECK
Battery Remaining Longevity: 77 mo
Battery Voltage: 2.99 V
Brady Statistic AP VS Percent: 14.9 %
Brady Statistic AS VS Percent: 85.06 %
HighPow Impedance: 50 Ohm
HighPow Impedance: 62 Ohm
Implantable Lead Implant Date: 20071019
Implantable Lead Location: 753859
Implantable Lead Model: 5076
Implantable Lead Model: 6947
Lead Channel Impedance Value: 285 Ohm
Lead Channel Impedance Value: 361 Ohm
Lead Channel Pacing Threshold Amplitude: 0.625 V
Lead Channel Pacing Threshold Amplitude: 1.125 V
Lead Channel Sensing Intrinsic Amplitude: 2.75 mV
Lead Channel Sensing Intrinsic Amplitude: 2.75 mV
Lead Channel Sensing Intrinsic Amplitude: 9.25 mV
Lead Channel Setting Pacing Amplitude: 2 V
Lead Channel Setting Pacing Amplitude: 2.5 V
Lead Channel Setting Pacing Pulse Width: 0.4 ms
Lead Channel Setting Sensing Sensitivity: 0.3 mV
MDC IDC LEAD IMPLANT DT: 20071003
MDC IDC LEAD LOCATION: 753860
MDC IDC MSMT LEADCHNL RA IMPEDANCE VALUE: 475 Ohm
MDC IDC MSMT LEADCHNL RA PACING THRESHOLD PULSEWIDTH: 0.4 ms
MDC IDC MSMT LEADCHNL RV PACING THRESHOLD PULSEWIDTH: 0.4 ms
MDC IDC MSMT LEADCHNL RV SENSING INTR AMPL: 9.25 mV
MDC IDC PG IMPLANT DT: 20140729
MDC IDC SESS DTM: 20180904134531
MDC IDC STAT BRADY AP VP PERCENT: 0.01 %
MDC IDC STAT BRADY AS VP PERCENT: 0.03 %
MDC IDC STAT BRADY RA PERCENT PACED: 14.79 %
MDC IDC STAT BRADY RV PERCENT PACED: 0.04 %

## 2017-03-21 ENCOUNTER — Encounter: Payer: Self-pay | Admitting: Cardiology

## 2017-03-22 DIAGNOSIS — M1712 Unilateral primary osteoarthritis, left knee: Secondary | ICD-10-CM | POA: Diagnosis not present

## 2017-03-22 DIAGNOSIS — M545 Low back pain: Secondary | ICD-10-CM | POA: Diagnosis not present

## 2017-03-22 DIAGNOSIS — M1611 Unilateral primary osteoarthritis, right hip: Secondary | ICD-10-CM | POA: Diagnosis not present

## 2017-03-27 DIAGNOSIS — M1611 Unilateral primary osteoarthritis, right hip: Secondary | ICD-10-CM | POA: Diagnosis not present

## 2017-03-28 ENCOUNTER — Other Ambulatory Visit: Payer: Self-pay | Admitting: Physical Medicine and Rehabilitation

## 2017-03-28 DIAGNOSIS — M25551 Pain in right hip: Secondary | ICD-10-CM

## 2017-04-12 ENCOUNTER — Ambulatory Visit
Admission: RE | Admit: 2017-04-12 | Discharge: 2017-04-12 | Disposition: A | Discharge status: Home / Self Care | Payer: Medicare Other | Source: Ambulatory Visit | Attending: Physical Medicine and Rehabilitation | Admitting: Physical Medicine and Rehabilitation

## 2017-04-12 DIAGNOSIS — M25551 Pain in right hip: Secondary | ICD-10-CM | POA: Diagnosis not present

## 2017-04-12 MED ORDER — METHYLPREDNISOLONE ACETATE 40 MG/ML INJ SUSP (RADIOLOG
120.0000 mg | Freq: Once | INTRAMUSCULAR | Status: AC
  Administered 2017-04-12: 120 mg via INTRA_ARTICULAR

## 2017-04-12 MED ORDER — IOPAMIDOL (ISOVUE-M 200) INJECTION 41%
1.0000 mL | Freq: Once | INTRAMUSCULAR | Status: AC
  Administered 2017-04-12: 1 mL via INTRA_ARTICULAR

## 2017-04-30 DIAGNOSIS — G43009 Migraine without aura, not intractable, without status migrainosus: Secondary | ICD-10-CM | POA: Diagnosis not present

## 2017-05-28 DIAGNOSIS — M1611 Unilateral primary osteoarthritis, right hip: Secondary | ICD-10-CM | POA: Diagnosis not present

## 2017-05-28 DIAGNOSIS — J101 Influenza due to other identified influenza virus with other respiratory manifestations: Secondary | ICD-10-CM | POA: Diagnosis not present

## 2017-06-12 ENCOUNTER — Ambulatory Visit (INDEPENDENT_AMBULATORY_CARE_PROVIDER_SITE_OTHER): Payer: Medicare Other | Admitting: *Deleted

## 2017-06-12 ENCOUNTER — Telehealth: Payer: Self-pay | Admitting: Cardiology

## 2017-06-12 DIAGNOSIS — I472 Ventricular tachycardia, unspecified: Secondary | ICD-10-CM

## 2017-06-12 NOTE — Telephone Encounter (Signed)
LMOVM reminding pt to send remote transmission.   

## 2017-06-13 ENCOUNTER — Encounter: Payer: Self-pay | Admitting: Cardiology

## 2017-06-13 DIAGNOSIS — J069 Acute upper respiratory infection, unspecified: Secondary | ICD-10-CM | POA: Diagnosis not present

## 2017-06-13 LAB — CUP PACEART REMOTE DEVICE CHECK
Battery Remaining Longevity: 69 mo
Brady Statistic AP VS Percent: 12.84 %
Brady Statistic AS VP Percent: 0.03 %
Brady Statistic AS VS Percent: 87.12 %
Brady Statistic RV Percent Paced: 0.04 %
HighPow Impedance: 53 Ohm
HighPow Impedance: 65 Ohm
Implantable Lead Implant Date: 20071003
Implantable Lead Location: 753860
Implantable Lead Model: 5076
Lead Channel Impedance Value: 475 Ohm
Lead Channel Pacing Threshold Amplitude: 0.5 V
Lead Channel Pacing Threshold Amplitude: 1.25 V
Lead Channel Pacing Threshold Pulse Width: 0.4 ms
Lead Channel Pacing Threshold Pulse Width: 0.4 ms
Lead Channel Sensing Intrinsic Amplitude: 2.25 mV
Lead Channel Sensing Intrinsic Amplitude: 2.25 mV
Lead Channel Setting Pacing Amplitude: 2 V
Lead Channel Setting Pacing Amplitude: 2.5 V
MDC IDC LEAD IMPLANT DT: 20071019
MDC IDC LEAD LOCATION: 753859
MDC IDC MSMT BATTERY VOLTAGE: 2.99 V
MDC IDC MSMT LEADCHNL RV IMPEDANCE VALUE: 304 Ohm
MDC IDC MSMT LEADCHNL RV IMPEDANCE VALUE: 361 Ohm
MDC IDC MSMT LEADCHNL RV SENSING INTR AMPL: 9.25 mV
MDC IDC MSMT LEADCHNL RV SENSING INTR AMPL: 9.25 mV
MDC IDC PG IMPLANT DT: 20140729
MDC IDC SESS DTM: 20181204215504
MDC IDC SET LEADCHNL RV PACING PULSEWIDTH: 0.4 ms
MDC IDC SET LEADCHNL RV SENSING SENSITIVITY: 0.3 mV
MDC IDC STAT BRADY AP VP PERCENT: 0.01 %
MDC IDC STAT BRADY RA PERCENT PACED: 12.74 %

## 2017-06-13 NOTE — Progress Notes (Signed)
Remote ICD transmission.   

## 2017-06-22 DIAGNOSIS — J069 Acute upper respiratory infection, unspecified: Secondary | ICD-10-CM | POA: Diagnosis not present

## 2017-06-22 DIAGNOSIS — R51 Headache: Secondary | ICD-10-CM | POA: Diagnosis not present

## 2017-06-22 DIAGNOSIS — D696 Thrombocytopenia, unspecified: Secondary | ICD-10-CM | POA: Diagnosis not present

## 2017-07-09 DIAGNOSIS — G43719 Chronic migraine without aura, intractable, without status migrainosus: Secondary | ICD-10-CM | POA: Diagnosis not present

## 2017-07-09 DIAGNOSIS — G43839 Menstrual migraine, intractable, without status migrainosus: Secondary | ICD-10-CM | POA: Diagnosis not present

## 2017-07-12 DIAGNOSIS — M791 Myalgia, unspecified site: Secondary | ICD-10-CM | POA: Diagnosis not present

## 2017-07-12 DIAGNOSIS — G43839 Menstrual migraine, intractable, without status migrainosus: Secondary | ICD-10-CM | POA: Diagnosis not present

## 2017-07-12 DIAGNOSIS — G43719 Chronic migraine without aura, intractable, without status migrainosus: Secondary | ICD-10-CM | POA: Diagnosis not present

## 2017-07-12 DIAGNOSIS — M542 Cervicalgia: Secondary | ICD-10-CM | POA: Diagnosis not present

## 2017-07-12 DIAGNOSIS — G518 Other disorders of facial nerve: Secondary | ICD-10-CM | POA: Diagnosis not present

## 2017-07-12 DIAGNOSIS — R51 Headache: Secondary | ICD-10-CM | POA: Diagnosis not present

## 2017-07-19 DIAGNOSIS — Z9181 History of falling: Secondary | ICD-10-CM | POA: Diagnosis not present

## 2017-07-19 DIAGNOSIS — Z0000 Encounter for general adult medical examination without abnormal findings: Secondary | ICD-10-CM | POA: Diagnosis not present

## 2017-07-19 DIAGNOSIS — N959 Unspecified menopausal and perimenopausal disorder: Secondary | ICD-10-CM | POA: Diagnosis not present

## 2017-07-19 DIAGNOSIS — Z1231 Encounter for screening mammogram for malignant neoplasm of breast: Secondary | ICD-10-CM | POA: Diagnosis not present

## 2017-07-19 DIAGNOSIS — E785 Hyperlipidemia, unspecified: Secondary | ICD-10-CM | POA: Diagnosis not present

## 2017-07-19 DIAGNOSIS — N182 Chronic kidney disease, stage 2 (mild): Secondary | ICD-10-CM | POA: Diagnosis not present

## 2017-07-19 DIAGNOSIS — Z1331 Encounter for screening for depression: Secondary | ICD-10-CM | POA: Diagnosis not present

## 2017-07-19 DIAGNOSIS — I2510 Atherosclerotic heart disease of native coronary artery without angina pectoris: Secondary | ICD-10-CM | POA: Diagnosis not present

## 2017-07-19 DIAGNOSIS — E663 Overweight: Secondary | ICD-10-CM | POA: Diagnosis not present

## 2017-07-19 DIAGNOSIS — I1310 Hypertensive heart and chronic kidney disease without heart failure, with stage 1 through stage 4 chronic kidney disease, or unspecified chronic kidney disease: Secondary | ICD-10-CM | POA: Diagnosis not present

## 2017-07-19 DIAGNOSIS — Z1389 Encounter for screening for other disorder: Secondary | ICD-10-CM | POA: Diagnosis not present

## 2017-07-19 DIAGNOSIS — Z1211 Encounter for screening for malignant neoplasm of colon: Secondary | ICD-10-CM | POA: Diagnosis not present

## 2017-07-31 DIAGNOSIS — M791 Myalgia, unspecified site: Secondary | ICD-10-CM | POA: Diagnosis not present

## 2017-07-31 DIAGNOSIS — R51 Headache: Secondary | ICD-10-CM | POA: Diagnosis not present

## 2017-07-31 DIAGNOSIS — G43719 Chronic migraine without aura, intractable, without status migrainosus: Secondary | ICD-10-CM | POA: Diagnosis not present

## 2017-07-31 DIAGNOSIS — G43839 Menstrual migraine, intractable, without status migrainosus: Secondary | ICD-10-CM | POA: Diagnosis not present

## 2017-07-31 DIAGNOSIS — G518 Other disorders of facial nerve: Secondary | ICD-10-CM | POA: Diagnosis not present

## 2017-07-31 DIAGNOSIS — M542 Cervicalgia: Secondary | ICD-10-CM | POA: Diagnosis not present

## 2017-08-14 DIAGNOSIS — M542 Cervicalgia: Secondary | ICD-10-CM | POA: Diagnosis not present

## 2017-08-14 DIAGNOSIS — G518 Other disorders of facial nerve: Secondary | ICD-10-CM | POA: Diagnosis not present

## 2017-08-14 DIAGNOSIS — G43719 Chronic migraine without aura, intractable, without status migrainosus: Secondary | ICD-10-CM | POA: Diagnosis not present

## 2017-08-14 DIAGNOSIS — M791 Myalgia, unspecified site: Secondary | ICD-10-CM | POA: Diagnosis not present

## 2017-08-14 DIAGNOSIS — R51 Headache: Secondary | ICD-10-CM | POA: Diagnosis not present

## 2017-08-14 DIAGNOSIS — G43839 Menstrual migraine, intractable, without status migrainosus: Secondary | ICD-10-CM | POA: Diagnosis not present

## 2017-08-17 DIAGNOSIS — M85851 Other specified disorders of bone density and structure, right thigh: Secondary | ICD-10-CM | POA: Diagnosis not present

## 2017-08-17 DIAGNOSIS — M8580 Other specified disorders of bone density and structure, unspecified site: Secondary | ICD-10-CM | POA: Diagnosis not present

## 2017-08-17 DIAGNOSIS — Z1231 Encounter for screening mammogram for malignant neoplasm of breast: Secondary | ICD-10-CM | POA: Diagnosis not present

## 2017-08-17 DIAGNOSIS — N959 Unspecified menopausal and perimenopausal disorder: Secondary | ICD-10-CM | POA: Diagnosis not present

## 2017-09-11 ENCOUNTER — Ambulatory Visit (INDEPENDENT_AMBULATORY_CARE_PROVIDER_SITE_OTHER): Payer: Medicare Other | Admitting: *Deleted

## 2017-09-11 DIAGNOSIS — I472 Ventricular tachycardia, unspecified: Secondary | ICD-10-CM

## 2017-09-11 LAB — CUP PACEART REMOTE DEVICE CHECK
Battery Remaining Longevity: 69 mo
Battery Voltage: 2.99 V
Brady Statistic AS VS Percent: 89.56 %
Brady Statistic RA Percent Paced: 10.38 %
Brady Statistic RV Percent Paced: 0.04 %
HIGH POWER IMPEDANCE MEASURED VALUE: 60 Ohm
HighPow Impedance: 48 Ohm
Implantable Lead Implant Date: 20071019
Implantable Lead Location: 753860
Implantable Lead Model: 5076
Implantable Pulse Generator Implant Date: 20140729
Lead Channel Impedance Value: 285 Ohm
Lead Channel Impedance Value: 456 Ohm
Lead Channel Pacing Threshold Amplitude: 0.5 V
Lead Channel Sensing Intrinsic Amplitude: 3.875 mV
Lead Channel Sensing Intrinsic Amplitude: 3.875 mV
Lead Channel Setting Pacing Amplitude: 2 V
Lead Channel Setting Pacing Amplitude: 2.5 V
MDC IDC LEAD IMPLANT DT: 20071003
MDC IDC LEAD LOCATION: 753859
MDC IDC MSMT LEADCHNL RA PACING THRESHOLD PULSEWIDTH: 0.4 ms
MDC IDC MSMT LEADCHNL RV IMPEDANCE VALUE: 361 Ohm
MDC IDC MSMT LEADCHNL RV PACING THRESHOLD AMPLITUDE: 0.75 V
MDC IDC MSMT LEADCHNL RV PACING THRESHOLD PULSEWIDTH: 0.4 ms
MDC IDC MSMT LEADCHNL RV SENSING INTR AMPL: 8.25 mV
MDC IDC MSMT LEADCHNL RV SENSING INTR AMPL: 8.25 mV
MDC IDC SESS DTM: 20190305143557
MDC IDC SET LEADCHNL RV PACING PULSEWIDTH: 0.4 ms
MDC IDC SET LEADCHNL RV SENSING SENSITIVITY: 0.3 mV
MDC IDC STAT BRADY AP VP PERCENT: 0.01 %
MDC IDC STAT BRADY AP VS PERCENT: 10.4 %
MDC IDC STAT BRADY AS VP PERCENT: 0.04 %

## 2017-09-11 NOTE — Progress Notes (Signed)
Remote ICD transmission.   

## 2017-09-12 ENCOUNTER — Encounter: Payer: Self-pay | Admitting: Cardiology

## 2017-09-13 DIAGNOSIS — G43719 Chronic migraine without aura, intractable, without status migrainosus: Secondary | ICD-10-CM | POA: Diagnosis not present

## 2017-09-13 DIAGNOSIS — G43839 Menstrual migraine, intractable, without status migrainosus: Secondary | ICD-10-CM | POA: Diagnosis not present

## 2017-09-13 DIAGNOSIS — M791 Myalgia, unspecified site: Secondary | ICD-10-CM | POA: Diagnosis not present

## 2017-09-13 DIAGNOSIS — M542 Cervicalgia: Secondary | ICD-10-CM | POA: Diagnosis not present

## 2017-09-13 DIAGNOSIS — R51 Headache: Secondary | ICD-10-CM | POA: Diagnosis not present

## 2017-09-13 DIAGNOSIS — G518 Other disorders of facial nerve: Secondary | ICD-10-CM | POA: Diagnosis not present

## 2017-09-17 DIAGNOSIS — M1611 Unilateral primary osteoarthritis, right hip: Secondary | ICD-10-CM | POA: Diagnosis not present

## 2017-09-21 DIAGNOSIS — R32 Unspecified urinary incontinence: Secondary | ICD-10-CM | POA: Diagnosis not present

## 2017-09-21 DIAGNOSIS — F339 Major depressive disorder, recurrent, unspecified: Secondary | ICD-10-CM | POA: Diagnosis not present

## 2017-09-21 DIAGNOSIS — I2510 Atherosclerotic heart disease of native coronary artery without angina pectoris: Secondary | ICD-10-CM | POA: Diagnosis not present

## 2017-09-21 DIAGNOSIS — E782 Mixed hyperlipidemia: Secondary | ICD-10-CM | POA: Diagnosis not present

## 2017-09-21 DIAGNOSIS — Z79899 Other long term (current) drug therapy: Secondary | ICD-10-CM | POA: Diagnosis not present

## 2017-09-21 DIAGNOSIS — D696 Thrombocytopenia, unspecified: Secondary | ICD-10-CM | POA: Diagnosis not present

## 2017-09-24 DIAGNOSIS — M1611 Unilateral primary osteoarthritis, right hip: Secondary | ICD-10-CM | POA: Diagnosis not present

## 2017-09-27 ENCOUNTER — Encounter: Payer: Self-pay | Admitting: Hematology and Oncology

## 2017-09-27 ENCOUNTER — Telehealth: Payer: Self-pay | Admitting: Hematology and Oncology

## 2017-09-27 ENCOUNTER — Telehealth: Payer: Self-pay

## 2017-09-27 NOTE — Telephone Encounter (Signed)
   [facility] Pre-operative Risk Assessment    Request for surgical clearance:  1. What type of surgery is being performed? Right total hip replacement   2. When is this surgery scheduled? To be determined   3. What type of clearance is required (medical clearance vs. Pharmacy clearance to hold med vs. Both)? Both   4. Are there any medications that need to be held prior to surgery and how long? Not listed, ELIQUIS  5. Practice name and name of physician performing surgery? [facility], Dr. Mardelle Matte   6. What is your office phone and fax number? [phone], fax: [phone], attn Sherri   7. Anesthesia type (None, local, MAC, general) ? Not listed    Rachel Bates 09/27/2017, 12:33 PM  _________________________________________________________________   (provider comments below)

## 2017-09-27 NOTE — Telephone Encounter (Signed)
Appt has been scheduled for the pt to see Dr. Lindi Adie on 4/15 at 1pm. Pt aware to arrive 30 minutes early. Letter mailed

## 2017-09-28 NOTE — Telephone Encounter (Signed)
Per [facility] Pre Op protocol pt needs appt. Pt see's Dr. Lovena Le, though has been scheduled to see Chanetta Marshall, NP 10/01/17 @ 2:50. Pt is agreeable to time and date of appt.

## 2017-09-28 NOTE — Telephone Encounter (Addendum)
   Primary Cardiologist:Gregg Lovena Le, MD  Chart reviewed as part of pre-operative protocol coverage.   The patient has a hx of coronary artery disease s/p prior myocardial infarction, ischemic cardiomyopathy, atrial fibrillation, s/p ICD.  She was last seen by Dr. Lovena Le 12/11/16.  She now needs a R total hip replacement.  Because of MARSA MATTEO past medical history and time since last visit, she will require a follow-up visit in order to better assess preoperative cardiovascular risk.  Pre-op covering staff: - Please schedule appointment and call patient to inform her. - Please route this phone note to the provider that will see the patient for clearance (it will be removed from the Preop Pool).   - Please contact requesting surgeon's office via preferred method (i.e, phone, fax) to inform them of need for appointment prior to surgery.  Richardson Dopp, PA-C  09/28/2017, 11:26 AM

## 2017-09-28 NOTE — Telephone Encounter (Signed)
I s/w Claiborne Billings at [employer] [facility] [employer] with update pt has appt in our office Monday 10/01/17 @ 2:50 with Chanetta Marshall, NP for her surgery clearance. Claiborne Billings thanked me for the update.

## 2017-09-28 NOTE — Telephone Encounter (Signed)
Patient with diagnosis of atrial fibrillation on Eliquis for anticoagulation.    Procedure: right total hip replacement Date of procedure: TBD  CHADS2-VASc score of  4 (CHF, AGE, CAD, female)  CrCl 71.13 Platelet count 109 (from 2014)  Per office protocol, patient can hold Eliquis for 3 days prior to procedure.    Patient should restart Eliquis at full dose on the day after, at discretion of procedure MD

## 2017-10-01 ENCOUNTER — Ambulatory Visit (INDEPENDENT_AMBULATORY_CARE_PROVIDER_SITE_OTHER): Payer: Medicare Other | Admitting: Cardiology

## 2017-10-01 ENCOUNTER — Ambulatory Visit: Payer: Medicare Other | Admitting: Nurse Practitioner

## 2017-10-01 ENCOUNTER — Encounter: Payer: Self-pay | Admitting: Cardiology

## 2017-10-01 VITALS — BP 116/64 | HR 72 | Ht 67.0 in | Wt 173.8 lb

## 2017-10-01 DIAGNOSIS — Z95810 Presence of automatic (implantable) cardiac defibrillator: Secondary | ICD-10-CM

## 2017-10-01 DIAGNOSIS — M791 Myalgia, unspecified site: Secondary | ICD-10-CM | POA: Diagnosis not present

## 2017-10-01 DIAGNOSIS — I480 Paroxysmal atrial fibrillation: Secondary | ICD-10-CM | POA: Diagnosis not present

## 2017-10-01 DIAGNOSIS — I2510 Atherosclerotic heart disease of native coronary artery without angina pectoris: Secondary | ICD-10-CM | POA: Diagnosis not present

## 2017-10-01 DIAGNOSIS — G518 Other disorders of facial nerve: Secondary | ICD-10-CM | POA: Diagnosis not present

## 2017-10-01 DIAGNOSIS — G43839 Menstrual migraine, intractable, without status migrainosus: Secondary | ICD-10-CM | POA: Diagnosis not present

## 2017-10-01 DIAGNOSIS — M542 Cervicalgia: Secondary | ICD-10-CM | POA: Diagnosis not present

## 2017-10-01 DIAGNOSIS — I5022 Chronic systolic (congestive) heart failure: Secondary | ICD-10-CM

## 2017-10-01 DIAGNOSIS — Z01818 Encounter for other preprocedural examination: Secondary | ICD-10-CM | POA: Diagnosis not present

## 2017-10-01 DIAGNOSIS — R51 Headache: Secondary | ICD-10-CM | POA: Diagnosis not present

## 2017-10-01 DIAGNOSIS — Z86718 Personal history of other venous thrombosis and embolism: Secondary | ICD-10-CM | POA: Insufficient documentation

## 2017-10-01 DIAGNOSIS — G43719 Chronic migraine without aura, intractable, without status migrainosus: Secondary | ICD-10-CM | POA: Diagnosis not present

## 2017-10-01 NOTE — Patient Instructions (Signed)
Medication Instructions:  Your physician recommends that you continue on your current medications as directed. Please refer to the Current Medication list given to you today.  Labwork: None  Testing/Procedures: None  Follow-Up: Your physician recommends that you keep your June appointment with Dr. Lovena Le.   Any Other Special Instructions Will Be Listed Below (If Applicable).     If you need a refill on your cardiac medications before your next appointment, please call your pharmacy.

## 2017-10-01 NOTE — Progress Notes (Signed)
Cardiology Office Note:    Date:  10/01/2017   ID:  [name], DOB 09-Jul-1946, MRN [id number]  PCP:  Melony Overly, MD  Cardiologist:  Cristopher Peru, MD  Referring MD: Melony Overly, MD   Chief Complaint  Patient presents with  . Pre-op Exam    History of Present Illness:    Rachel Bates is a 72 y.o. female with a past medical history significant for CAD with MI with 2 stents in 1995, ischemic cardiomyopathy, chronic class II systolic heart failure and ventricular tachycardia, ICD.  She is status post ICD implantation 2007 in [place]. She came to [facility] in 2005 for a year then back to [place] to care for mother. She came back to [place] in about 2010. She had a generator change in 2014 by Dr. Lovena Le. She has had no further ischemic events and no VT since ICD. Pt has a history of DVT X2. The first time was related to travel. The second time in 2012 with associated PE and was reportedly unprovoked. She was on warfarin for 6 months after that. She has an appointment with a hematologist prior to her hip surgery to evaluate for clotting disorder.   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 She is not very active due to joint issues, pain and instability. She denies DOE with what activity she can do. Going up one flight of stairs is difficult. No chest pain/pressure or tightness. Sleeps in a recliner due to hip and back pain. She does not think she would have trouble breathing laying flat. No edema. No palpitations, syncope or near syncope. Has occasional dizziness upon first rising after being seated for an extended time.    No VT or afib detections on last ICD check 09/11/17 or in 06/2017. She was noted to have brief episode of afib on an ICD interrogation in 12/2016 and was started on Apixaban 5 mg bid for CHA2DS2/VAS Stroke Risk Score of 4 (HTN, CAD, age, female).       Past Medical History:  Diagnosis Date  . CAD (coronary artery disease)    MI and 2 stents 1995 in [place]  . 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  Marland Kitchen Dyslipidemia   . Ischemic cardiomyopathy   . Myocardial infarct ([place]) 1995   MI and 2 stents in 1995 in [place]  . Pulmonary embolism ([place]) 2012  . Ventricular tachycardia [facility])     Past Surgical History:  Procedure Laterality Date  . CARDIAC DEFIBRILLATOR PLACEMENT    . IMPLANTABLE CARDIOVERTER DEFIBRILLATOR GENERATOR CHANGE N/A 02/04/2013   Procedure: IMPLANTABLE CARDIOVERTER DEFIBRILLATOR GENERATOR CHANGE;  Surgeon: Evans Lance, MD;  Location: [facility] CATH LAB;  Service: Cardiovascular;  Laterality: N/A;    Current Medications: Current Meds  Medication Sig  . apixaban (ELIQUIS) 5 MG TABS tablet Take 1 tablet (5 mg total) by mouth 2 (two) times daily.  . B Complex-C-Folic Acid (STRESS FORMULA) TABS Take 1 tablet by mouth daily.   . baclofen (LIORESAL) 10 MG tablet as needed.   . Calcium Carbonate-Vitamin D (CALCIUM-D) 600-400 MG-UNIT TABS Take 1 tablet by mouth 2 (two) times daily.  . carvedilol (COREG) 25 MG tablet Take 12.5 mg by mouth 2 (two) times daily with a meal.  . Cholecalciferol (VITAMIN D-3) 1000 UNITS CAPS Take 1 capsule by mouth daily.  . diphenhydrAMINE (BENADRYL) 25 mg capsule Take 25 mg by  mouth daily.  Marland Kitchen FIBER PO Take 1 capsule by mouth daily.  . Omega-3 Fatty Acids (FISH OIL) 1000 MG CAPS Take 1 capsule by mouth daily.   . ramipril (ALTACE) 2.5 MG tablet Take 2.5 mg by mouth daily.    . sertraline (ZOLOFT) 100 MG tablet Take 100 mg by mouth at bedtime.  . simvastatin (ZOCOR) 40 MG tablet Take 40 mg by mouth at bedtime.    . vitamin E 400 UNIT capsule Take 400 Units by mouth daily.  Marland Kitchen zonisamide (ZONEGRAN) 50 MG capsule Take 50 mg by mouth 2 (two) times daily.      Allergies:   Sumatriptan succinate and Amitriptyline   Social History   Socioeconomic History  . Marital status: Married    Spouse name: Not on file  . Number of children: Not on file  . Years of  education: Not on file  . Highest education level: Not on file  Occupational History  . Not on file  Social Needs  . Financial resource strain: Not on file  . Food insecurity:    Worry: Not on file    Inability: Not on file  . Transportation needs:    Medical: Not on file    Non-medical: Not on file  Tobacco Use  . Smoking status: Former Research scientist (life sciences)  . Smokeless tobacco: Never Used  Substance and Sexual Activity  . Alcohol use: No  . Drug use: No  . Sexual activity: Not on file  Lifestyle  . Physical activity:    Days per week: Not on file    Minutes per session: Not on file  . Stress: Not on file  Relationships  . Social connections:    Talks on phone: Not on file    Gets together: Not on file    Attends religious service: Not on file    Active member of club or organization: Not on file    Attends meetings of clubs or organizations: Not on file    Relationship status: Not on file  Other Topics Concern  . Not on file  Social History Narrative  . Not on file     Family History: The patient's family history includes Alzheimer's disease in her mother; Congestive Heart Failure in her maternal grandmother; Coronary artery disease in her father; Dementia in her paternal grandmother; Heart attack in her maternal grandfather; Hypertension in her maternal grandmother and mother; Other in her maternal grandfather; Other (age of onset: 12) in her paternal grandfather; Pulmonary embolism in her father; Thyroid disease in her mother. ROS:   Please see the history of present illness.     All other systems reviewed and are negative.  EKGs/Labs/Other Studies Reviewed:    The following studies were reviewed today: None  EKG:  EKG is ordered today.  The ekg ordered today demonstrates NSR, 72 bpm, no change form previous.   Recent Labs: 12/11/2016: BUN 11; Creatinine, Ser 0.86; Potassium 3.8; Sodium 143   Recent Lipid Panel No results found for: CHOL, TRIG, HDL, CHOLHDL, VLDL, LDLCALC,  LDLDIRECT  Physical Exam:    VS:  BP 116/64 (BP Location: Right Arm, Patient Position: Sitting, Cuff Size: Normal)   Pulse 72   Ht 5\' 7"  (1.702 m)   Wt 173 lb 12.8 oz (78.8 kg)   SpO2 96%   BMI 27.22 kg/m     Wt Readings from Last 3 Encounters:  10/01/17 173 lb 12.8 oz (78.8 kg)  12/11/16 168 lb (76.2 kg)  12/08/15 173 lb (78.5  kg)     Physical Exam  Constitutional: She is oriented to person, place, and time. She appears well-developed and well-nourished. No distress.  HENT:  Head: Normocephalic and atraumatic.  Neck: Neck supple. No JVD present.  Cardiovascular: Normal rate, regular rhythm, normal heart sounds and intact distal pulses. Exam reveals no gallop and no friction rub.  No murmur heard. Pulmonary/Chest: Effort normal and breath sounds normal. No respiratory distress. She has no wheezes. She has no rales.  Abdominal: Soft. Bowel sounds are normal.  Musculoskeletal: Normal range of motion. She exhibits no edema.  Neurological: She is alert and oriented to person, place, and time.  Skin: Skin is warm and dry.  Psychiatric: She has a normal mood and affect. Her behavior is normal. Thought content normal.    ASSESSMENT:    1. Pre-operative clearance   2. Coronary artery disease involving native coronary artery of native heart without angina pectoris   3. Chronic systolic heart failure (HCC)   4. Paroxysmal atrial fibrillation ([place])   5. Automatic implantable cardioverter-defibrillator in situ   6. History of DVT (deep vein thrombosis)    PLAN:    In order of problems listed above:  CAD: remote hx of MI with 2 stents in 1995 in [place]. No further ischemic events. No anginal symptoms. Not on aspirin due to need for anticoagulation. On BB, ACE-I, statin.   Ischemic cardiomyopathy: No recent heart function measurements. First diagnosed in [place]. Stable without any current heart failure symptoms. Is sedentary mostly due to joint pain. Is planned for hip  replacement.   ICD: Hx of NSVT. No recurrent episodes with ICD. Followed by Dr. Lovena Le. Device functioning properly by remote monitoring.   PAF: pt had one episode of afib that lasted about 2 hours in 12/2016 noted on ICD. None noted since. On Apixaban for stroke risk reduction.   Hypertension: Well controlled. Continue current medications.   Hx of DVT X2: One time may years ago was related to prolonged travel. In 2012 she had DVT with PE that was reportedly unprovoked. She was on warfarin for 6 months only. She is concerned for increased risk of DVT with the hip surgery. Currently she is anticoagulated with apixaban related to afib. She is going to see a hematologist to evaluate for clotting disorder prior to her surgery.   Hyperlipidemia: On simvastatin. Followed by PCP. Pt reports was told that her lipids were "very good". Discussed goal of <70.   Preoperative clearance for right total hip replacement: pt is without ischemic symptoms, heart failure symptoms or recent arrhythmias. The patient is a class II risk with 0.9% risk of major cardiac event perioperatively. She has a low functional capacity related to joint issues that limit her physical activity. She is at acceptable risk for the planned procedure from a cardiac standpoint.   Per our pharmacist:  CHADS2-VASc score of  4 (CHF, AGE, CAD, female) CrCl 71.13 Platelet count 109 (from 2014) Per office protocol, patient can hold Eliquis for 3 days prior to procedure.   Patient should restart Eliquis at full dose on the day after, at discretion of procedure MD  I will efax this note to the requesting surgeon.     Medication Adjustments/Labs and Tests Ordered: Current medicines are reviewed at length with the patient today.  Concerns regarding medicines are outlined above. Labs and tests ordered and medication changes are outlined in the patient instructions below:  Patient Instructions  Medication Instructions:  Your physician  recommends that  you continue on your current medications as directed. Please refer to the Current Medication list given to you today.  Labwork: None  Testing/Procedures: None  Follow-Up: Your physician recommends that you keep your June appointment with Dr. Lovena Le.   Any Other Special Instructions Will Be Listed Below (If Applicable).     If you need a refill on your cardiac medications before your next appointment, please call your pharmacy.      Signed, Daune Perch, NP  10/01/2017 5:37 PM    [place]

## 2017-10-04 DIAGNOSIS — Z01818 Encounter for other preprocedural examination: Secondary | ICD-10-CM | POA: Diagnosis not present

## 2017-10-19 DIAGNOSIS — G43719 Chronic migraine without aura, intractable, without status migrainosus: Secondary | ICD-10-CM | POA: Diagnosis not present

## 2017-10-19 DIAGNOSIS — G518 Other disorders of facial nerve: Secondary | ICD-10-CM | POA: Diagnosis not present

## 2017-10-19 DIAGNOSIS — R51 Headache: Secondary | ICD-10-CM | POA: Diagnosis not present

## 2017-10-19 DIAGNOSIS — G43839 Menstrual migraine, intractable, without status migrainosus: Secondary | ICD-10-CM | POA: Diagnosis not present

## 2017-10-19 DIAGNOSIS — M542 Cervicalgia: Secondary | ICD-10-CM | POA: Diagnosis not present

## 2017-10-19 DIAGNOSIS — M791 Myalgia, unspecified site: Secondary | ICD-10-CM | POA: Diagnosis not present

## 2017-10-22 ENCOUNTER — Inpatient Hospital Stay: Payer: Medicare Other | Attending: Hematology and Oncology | Admitting: Hematology and Oncology

## 2017-10-22 DIAGNOSIS — Z832 Family history of diseases of the blood and blood-forming organs and certain disorders involving the immune mechanism: Secondary | ICD-10-CM | POA: Insufficient documentation

## 2017-10-22 DIAGNOSIS — Z86718 Personal history of other venous thrombosis and embolism: Secondary | ICD-10-CM | POA: Insufficient documentation

## 2017-10-22 DIAGNOSIS — Z87891 Personal history of nicotine dependence: Secondary | ICD-10-CM | POA: Insufficient documentation

## 2017-10-22 DIAGNOSIS — I4891 Unspecified atrial fibrillation: Secondary | ICD-10-CM | POA: Diagnosis not present

## 2017-10-22 DIAGNOSIS — Z86711 Personal history of pulmonary embolism: Secondary | ICD-10-CM | POA: Diagnosis not present

## 2017-10-22 DIAGNOSIS — I2782 Chronic pulmonary embolism: Secondary | ICD-10-CM

## 2017-10-22 NOTE — Assessment & Plan Note (Signed)
Perioperative management with a history of DVT and pulmonary embolism.  PE was diagnosed in 2012 when DVT was diagnosed 5 years prior to that. Diagnosed with atrial fibrillation in 2018.  She was then started on Eliquis and she has remained on Eliquis. Patient with extensive family history of her father having had recurrent blood clots after surgeries and also died from blood clot history.  Recommendation: 1. Stop Eliquis 3 days prior to surgery 2. Start Lovenox injections 1 mg/kg subcu twice a day up until the night before surgery. 3. No blood thinner injections on the day of surgery Day after surgery patient can resume either Lovenox or Eliquis During the immediate postoperative state, patients should use elastic compression stockings as well as pumps to improve blood circulation.  Return to clinic on an as-needed basis

## 2017-10-22 NOTE — Progress Notes (Signed)
[facility] CONSULT NOTE  Patient Care Team: Melony Overly, MD as PCP - General (Family Medicine) Evans Lance, MD as PCP - Cardiology (Cardiology) Rocky Link Winifred Olive, MD (Family Medicine)  CHIEF COMPLAINTS/PURPOSE OF CONSULTATION:  History of DVT and pulmonary embolism, patient to get hip replacement surgery, anticoagulation recommendations perioperatively  HISTORY OF PRESENTING ILLNESS:  Rachel Bates 72 y.o. female is here because of the need for the patient to undergo hip replacement surgery.  Patient was seen by orthopedics and she was referred to [place] to discuss anticoagulation recommendations.  Patient is currently on Eliquis twice a day for her atrial fibrillation.  Previously 2012 she had a history of pulmonary embolism that was not related to any particular cause.  5 years prior to that she had left leg DVT.  She was on anticoagulation for 6 months after the pulmonary embolism.  In 2018 she was diagnosed with atrial fibrillation and was started on Eliquis.  She has been on Eliquis and appears to be tolerating it well.  Her father apparently died of blood clots after a joint replacement surgery.  Because of this she got very afraid and wanted to touch base with [place] regarding any recommendations for perioperative management of risk of blood clots.  I reviewed her records extensively and collaborated the history with the patient.  MEDICAL HISTORY:  Past Medical History:  Diagnosis Date  . CAD (coronary artery disease)    MI and 2 stents 1995 in [place]  . 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  Marland Kitchen Dyslipidemia   . Ischemic cardiomyopathy   . 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 Pulmonary embolism ([facility]) 2012  . Ventricular tachycardia ([place])     SURGICAL HISTORY: Past Surgical History:  Procedure Laterality Date  . CARDIAC DEFIBRILLATOR PLACEMENT    . IMPLANTABLE CARDIOVERTER DEFIBRILLATOR GENERATOR  CHANGE N/A 02/04/2013   Procedure: IMPLANTABLE CARDIOVERTER DEFIBRILLATOR GENERATOR CHANGE;  Surgeon: Evans Lance, MD;  Location: [facility] CATH LAB;  Service: Cardiovascular;  Laterality: N/A;    SOCIAL HISTORY: Social History   Socioeconomic History  . Marital status: Married    Spouse name: Not on file  . Number of children: Not on file  . Years of education: Not on file  . Highest education level: Not on file  Occupational History  . Not on file  Social Needs  . Financial resource strain: Not on file  . Food insecurity:    Worry: Not on file    Inability: Not on file  . Transportation needs:    Medical: Not on file    Non-medical: Not on file  Tobacco Use  . Smoking status: Former Research scientist (life sciences)  . Smokeless tobacco: Never Used  Substance and Sexual Activity  . Alcohol use: No  . Drug use: No  . Sexual activity: Not on file  Lifestyle  . Physical activity:    Days per week: Not on file    Minutes per session: Not on file  . Stress: Not on file  Relationships  . Social connections:    Talks on phone: Not on file    Gets together: Not on file    Attends religious service: Not on file    Active member of club or organization: Not on file    Attends meetings of clubs or organizations: Not on file    Relationship status: Not on file  .  Intimate partner violence:    Fear of current or ex partner: Not on file    Emotionally abused: Not on file    Physically abused: Not on file    Forced sexual activity: Not on file  Other Topics Concern  . Not on file  Social History Narrative  . Not on file    FAMILY HISTORY: Family History  Problem Relation Age of Onset  . Hypertension Mother   . Thyroid disease Mother   . Alzheimer's disease Mother   . Coronary artery disease Father   . Pulmonary embolism Father   . Congestive Heart Failure Maternal Grandmother   . Hypertension Maternal Grandmother   . Heart attack Maternal Grandfather   . Other Maternal Grandfather        carotid  disease  . Dementia Paternal Grandmother   . Other Paternal Grandfather 39       accident    ALLERGIES:  is allergic to sumatriptan succinate and amitriptyline.  MEDICATIONS:  Current Outpatient Medications  Medication Sig Dispense Refill  . apixaban (ELIQUIS) 5 MG TABS tablet Take 1 tablet (5 mg total) by mouth 2 (two) times daily. 180 tablet 3  . B Complex-C-Folic Acid (STRESS FORMULA) TABS Take 1 tablet by mouth daily.     . baclofen (LIORESAL) 10 MG tablet as needed.     . Calcium Carbonate-Vitamin D (CALCIUM-D) 600-400 MG-UNIT TABS Take 1 tablet by mouth 2 (two) times daily.    . carvedilol (COREG) 25 MG tablet Take 12.5 mg by mouth 2 (two) times daily with a meal.    . Cholecalciferol (VITAMIN D-3) 1000 UNITS CAPS Take 1 capsule by mouth daily.    . diphenhydrAMINE (BENADRYL) 25 mg capsule Take 25 mg by mouth daily.    Marland Kitchen FIBER PO Take 1 capsule by mouth daily.    . furosemide (LASIX) 40 MG tablet Take 1 tablet (40 mg total) by mouth daily. (Patient taking differently: Take 40 mg by mouth as needed. ) 30 tablet 6  . Omega-3 Fatty Acids (FISH OIL) 1000 MG CAPS Take 1 capsule by mouth daily.     . ramipril (ALTACE) 2.5 MG tablet Take 2.5 mg by mouth daily.      . sertraline (ZOLOFT) 100 MG tablet Take 100 mg by mouth at bedtime.    . simvastatin (ZOCOR) 40 MG tablet Take 40 mg by mouth at bedtime.      . vitamin E 400 UNIT capsule Take 400 Units by mouth daily.    Marland Kitchen zonisamide (ZONEGRAN) 50 MG capsule Take 50 mg by mouth 2 (two) times daily.      No current facility-administered medications for this visit.     REVIEW OF SYSTEMS:   Constitutional: Denies fevers, chills or abnormal night sweats Eyes: Denies blurriness of vision, double vision or watery eyes Ears, nose, mouth, throat, and face: Denies mucositis or sore throat Respiratory: Denies cough, dyspnea or wheezes Cardiovascular: Denies palpitation, chest discomfort or lower extremity swelling Gastrointestinal:  Denies  nausea, heartburn or change in bowel habits Skin: Denies abnormal skin rashes Lymphatics: Denies new lymphadenopathy or easy bruising Neurological:Denies numbness, tingling or new weaknesses Behavioral/Psych: Mood is stable, no new changes  Extremities: Difficulty with gait because of joint pain All other systems were reviewed with the patient and are negative.  PHYSICAL EXAMINATION: ECOG PERFORMANCE STATUS: 1 - Symptomatic but completely ambulatory  Vitals:   10/22/17 1251  BP: (!) 132/57  Pulse: 86  Resp: 18  Temp: 98.4 F (36.9  C)  SpO2: 97%   Filed Weights   10/22/17 1251  Weight: 177 lb 1.6 oz (80.3 kg)    GENERAL:alert, no distress and comfortable SKIN: skin color, texture, turgor are normal, no rashes or significant lesions EYES: normal, conjunctiva are pink and non-injected, sclera clear OROPHARYNX:no exudate, no erythema and lips, buccal mucosa, and tongue normal  NECK: supple, thyroid normal size, non-tender, without nodularity LYMPH:  no palpable lymphadenopathy in the cervical, axillary or inguinal LUNGS: clear to auscultation and percussion with normal breathing effort HEART: regular rate & rhythm and no murmurs and no lower extremity edema ABDOMEN:abdomen soft, non-tender and normal bowel sounds Musculoskeletal:no cyanosis of digits and no clubbing  PSYCH: alert & oriented x 3 with fluent speech NEURO: no focal motor/sensory deficits  LABORATORY DATA:  I have reviewed the data as listed Lab Results  Component Value Date   WBC 4.0 (L) 01/29/2013   HGB 14.6 01/29/2013   HCT 42.9 01/29/2013   MCV 91.8 01/29/2013   PLT 109.0 (L) 01/29/2013   Lab Results  Component Value Date   NA 143 12/11/2016   K 3.8 12/11/2016   CL 104 12/11/2016   CO2 22 12/11/2016    RADIOGRAPHIC STUDIES: I have personally reviewed the radiological reports and agreed with the findings in the report.  ASSESSMENT AND PLAN:  Pulmonary embolism Perioperative management with a  history of DVT and pulmonary embolism.  PE was diagnosed in 2012 when DVT was diagnosed 5 years prior to that. Diagnosed with atrial fibrillation in 2018.  She was then started on Eliquis and she has remained on Eliquis. Patient with extensive family history of her father having had recurrent blood clots after surgeries and also died from blood clot history.  Recommendation: 1. Stop Eliquis 3 days prior to surgery 2. Start Lovenox injections 1 mg/kg subcu twice a day up until the night before surgery. 3. No blood thinner injections on the day of surgery 4. Day after surgery patient can resume either Lovenox or Eliquis  During the immediate postoperative state, patients should use elastic compression stockings as well as pumps to improve blood circulation.  Return to clinic on an as-needed basis  All questions were answered. The patient knows to call the clinic with any problems, questions or concerns.    Harriette Ohara, MD 10/22/17

## 2017-11-30 DIAGNOSIS — M791 Myalgia, unspecified site: Secondary | ICD-10-CM | POA: Diagnosis not present

## 2017-11-30 DIAGNOSIS — M542 Cervicalgia: Secondary | ICD-10-CM | POA: Diagnosis not present

## 2017-11-30 DIAGNOSIS — G43839 Menstrual migraine, intractable, without status migrainosus: Secondary | ICD-10-CM | POA: Diagnosis not present

## 2017-11-30 DIAGNOSIS — G518 Other disorders of facial nerve: Secondary | ICD-10-CM | POA: Diagnosis not present

## 2017-11-30 DIAGNOSIS — G43719 Chronic migraine without aura, intractable, without status migrainosus: Secondary | ICD-10-CM | POA: Diagnosis not present

## 2017-11-30 DIAGNOSIS — R51 Headache: Secondary | ICD-10-CM | POA: Diagnosis not present

## 2017-12-05 ENCOUNTER — Other Ambulatory Visit: Payer: Self-pay | Admitting: Orthopedic Surgery

## 2017-12-06 NOTE — Pre-Procedure Instructions (Signed)
Lestine Rahe Kolodny  12/06/2017      [id number] - [place], [place] - [address] [place] [address] Phone: [phone] Fax: [phone]    Your procedure is scheduled on June 11  Report to [facility] at [place].M.  Call this number if you have problems the morning of surgery:  [phone]   Remember:  NOTHING TO EAT OR DRINK AFTER MIDNIGHT    Take these medicines the morning of surgery with A SIP OF WATER  baclofen (LIORESAL) carvedilol (COREG) zonisamide (ZONEGRAN)  7 days prior to surgery STOP taking any Aspirin(unless otherwise instructed by your surgeon), Aleve, Naproxen, Ibuprofen, Motrin, Advil, Goody's, BC's, all herbal medications, fish oil, and all vitamins  FOLLOW PHYSICIANS INSTRUCTIONS ABOUT ELIQUIS    Do not wear jewelry, make-up or nail polish.  Do not wear lotions, powders, or perfumes, or deodorant.  Do not shave 48 hours prior to surgery.    Do not bring valuables to the hospital.  [facility] is not responsible for any belongings or valuables.  Contacts, dentures or bridgework may not be worn into surgery.  Leave your suitcase in the car.  After surgery it may be brought to your room.  For patients admitted to the hospital, discharge time will be determined by your treatment team.  Patients discharged the day of surgery will not be allowed to drive home.    Special instructions:   [facility]- Preparing For Surgery  Before surgery, you can play an important role. Because skin is not sterile, your skin needs to be as free of germs as possible. You can reduce the number of germs on your skin by washing with CHG (chlorahexidine gluconate) Soap before surgery.  CHG is an antiseptic cleaner which kills germs and bonds with the skin to continue killing germs even after washing.    Oral Hygiene is also important to reduce your risk of infection.  Remember - BRUSH YOUR TEETH THE MORNING OF SURGERY WITH YOUR  REGULAR TOOTHPASTE  Please do not use if you have an allergy to CHG or antibacterial soaps. If your skin becomes reddened/irritated stop using the CHG.  Do not shave (including legs and underarms) for at least 48 hours prior to first CHG shower. It is OK to shave your face.  Please follow these instructions carefully.   1. Shower the NIGHT BEFORE SURGERY and the MORNING OF SURGERY with CHG.   2. If you chose to wash your hair, wash your hair first as usual with your normal shampoo.  3. After you shampoo, rinse your hair and body thoroughly to remove the shampoo.  4. Use CHG as you would any other liquid soap. You can apply CHG directly to the skin and wash gently with a scrungie or a clean washcloth.   5. Apply the CHG Soap to your body ONLY FROM THE NECK DOWN.  Do not use on open wounds or open sores. Avoid contact with your eyes, ears, mouth and genitals (private parts). Wash Face and genitals (private parts)  with your normal soap.  6. Wash thoroughly, paying special attention to the area where your surgery will be performed.  7. Thoroughly rinse your body with warm water from the neck down.  8. DO NOT shower/wash with your normal soap after using and rinsing off the CHG Soap.  9. Pat yourself dry with a CLEAN TOWEL.  10. Wear CLEAN PAJAMAS to bed the night before surgery, wear  comfortable clothes the morning of surgery  11. Place CLEAN SHEETS on your bed the night of your first shower and DO NOT SLEEP WITH PETS.    Day of Surgery:  Do not apply any deodorants/lotions.  Please wear clean clothes to the hospital/surgery center.   Remember to brush your teeth WITH YOUR REGULAR TOOTHPASTE.  Please read over the following fact sheets that you were given.

## 2017-12-07 ENCOUNTER — Other Ambulatory Visit: Payer: Self-pay

## 2017-12-07 ENCOUNTER — Encounter (HOSPITAL_COMMUNITY)
Admission: RE | Admit: 2017-12-07 | Discharge: 2017-12-07 | Disposition: A | Discharge status: Home / Self Care | Payer: Medicare Other | Source: Ambulatory Visit | Attending: Orthopedic Surgery | Admitting: Orthopedic Surgery

## 2017-12-07 ENCOUNTER — Encounter (HOSPITAL_COMMUNITY): Payer: Self-pay

## 2017-12-07 DIAGNOSIS — Z955 Presence of coronary angioplasty implant and graft: Secondary | ICD-10-CM | POA: Insufficient documentation

## 2017-12-07 DIAGNOSIS — Z01818 Encounter for other preprocedural examination: Secondary | ICD-10-CM | POA: Insufficient documentation

## 2017-12-07 DIAGNOSIS — I2510 Atherosclerotic heart disease of native coronary artery without angina pectoris: Secondary | ICD-10-CM | POA: Insufficient documentation

## 2017-12-07 DIAGNOSIS — Z86711 Personal history of pulmonary embolism: Secondary | ICD-10-CM | POA: Diagnosis not present

## 2017-12-07 DIAGNOSIS — M1611 Unilateral primary osteoarthritis, right hip: Secondary | ICD-10-CM | POA: Insufficient documentation

## 2017-12-07 DIAGNOSIS — I255 Ischemic cardiomyopathy: Secondary | ICD-10-CM | POA: Insufficient documentation

## 2017-12-07 DIAGNOSIS — G4733 Obstructive sleep apnea (adult) (pediatric): Secondary | ICD-10-CM | POA: Insufficient documentation

## 2017-12-07 DIAGNOSIS — I4891 Unspecified atrial fibrillation: Secondary | ICD-10-CM | POA: Insufficient documentation

## 2017-12-07 DIAGNOSIS — Z95810 Presence of automatic (implantable) cardiac defibrillator: Secondary | ICD-10-CM | POA: Diagnosis not present

## 2017-12-07 DIAGNOSIS — Z01812 Encounter for preprocedural laboratory examination: Secondary | ICD-10-CM | POA: Diagnosis not present

## 2017-12-07 DIAGNOSIS — E785 Hyperlipidemia, unspecified: Secondary | ICD-10-CM | POA: Insufficient documentation

## 2017-12-07 DIAGNOSIS — Z86718 Personal history of other venous thrombosis and embolism: Secondary | ICD-10-CM | POA: Insufficient documentation

## 2017-12-07 DIAGNOSIS — Z79899 Other long term (current) drug therapy: Secondary | ICD-10-CM | POA: Diagnosis not present

## 2017-12-07 DIAGNOSIS — I252 Old myocardial infarction: Secondary | ICD-10-CM | POA: Insufficient documentation

## 2017-12-07 DIAGNOSIS — I509 Heart failure, unspecified: Secondary | ICD-10-CM | POA: Diagnosis not present

## 2017-12-07 DIAGNOSIS — Z7901 Long term (current) use of anticoagulants: Secondary | ICD-10-CM | POA: Insufficient documentation

## 2017-12-07 HISTORY — DX: Depression, unspecified: F32.A

## 2017-12-07 HISTORY — DX: Presence of automatic (implantable) cardiac defibrillator: Z95.810

## 2017-12-07 HISTORY — DX: Major depressive disorder, single episode, unspecified: F32.9

## 2017-12-07 LAB — CBC
HCT: 39.8 % (ref 36.0–46.0)
Hemoglobin: 12.7 g/dL (ref 12.0–15.0)
MCH: 26.3 pg (ref 26.0–34.0)
MCHC: 31.9 g/dL (ref 30.0–36.0)
MCV: 82.4 fL (ref 78.0–100.0)
PLATELETS: 138 10*3/uL — AB (ref 150–400)
RBC: 4.83 MIL/uL (ref 3.87–5.11)
RDW: 15.5 % (ref 11.5–15.5)
WBC: 4.4 10*3/uL (ref 4.0–10.5)

## 2017-12-07 LAB — BASIC METABOLIC PANEL
Anion gap: 6 (ref 5–15)
BUN: 10 mg/dL (ref 6–20)
CALCIUM: 9.1 mg/dL (ref 8.9–10.3)
CO2: 26 mmol/L (ref 22–32)
CREATININE: 0.76 mg/dL (ref 0.44–1.00)
Chloride: 107 mmol/L (ref 101–111)
GFR calc Af Amer: >60 mL/min (ref >60)
Glucose, Bld: 94 mg/dL (ref 65–99)
Potassium: 4.2 mmol/L (ref 3.5–5.1)
SODIUM: 139 mmol/L (ref 135–145)

## 2017-12-07 LAB — SURGICAL PCR SCREEN
MRSA, PCR: NEGATIVE
Staphylococcus aureus: NEGATIVE

## 2017-12-07 NOTE — Progress Notes (Signed)
PCP - Dr Carolanne Grumbling in [place] - Dr Lovena Le, ICD form faxed  EKG - 10/01/17 Cardiac Cath - 1995 in [place] with stents placed   Blood Thinner Instructions:stop Eliquis 3 days prior to surgery with Lovenox bridge Aspirin Instructions:  Anesthesia review: hx CAD, ICD  Patient denies shortness of breath, fever, cough and chest pain at PAT appointment   Patient verbalized understanding of instructions that were given to them at the PAT appointment. Patient was also instructed that they will need to review over the PAT instructions again at home before surgery.

## 2017-12-10 ENCOUNTER — Other Ambulatory Visit: Payer: Self-pay | Admitting: Orthopedic Surgery

## 2017-12-10 ENCOUNTER — Telehealth: Payer: Self-pay | Admitting: *Deleted

## 2017-12-10 NOTE — Telephone Encounter (Signed)
"  A few weeks ago when I saw Dr. Lindi Adie, I was told I need to stop Eloquis three days before surgery scheduled next Tuesday, 12-18-2017.  Calling to ask will Dr. Lindi Adie be ordering the Lovenox for surgery before surgery next Tuesday?"

## 2017-12-10 NOTE — Telephone Encounter (Signed)
Regarding below telephone note from [facility], I have sent note to Dr Sonny Dandy for further instructions on whether Lovenox prescription should be sent to her pharmacy, then I will call patient back.

## 2017-12-10 NOTE — Progress Notes (Signed)
Anesthesia Chart Review:   Case:  [id number] Date/Time:  12/18/17 0715   Procedure:  RIGHT TOTAL HIP ARTHROPLASTY (Right Hip)   Anesthesia type:  Choice   Pre-op diagnosis:  OA RIGHT HIP   Location:  [facility] OR ROOM 04 / [facility]   Surgeon:  Marchia Bond, MD      DISCUSSION: - Pt is a 72 year old Bates with hx CAD (stents x2 1995 in [place]), ischemic cardiomyopathy, CHF, NSVT, AICD (Medtronic implanted 2007; generator change 2014), atrial fibrillation, DVT, PE, OSA  - Pt has cardiac clearance for surgery  - Pt to hold eliquis 3 days before surgery with lovenox bridge   VS: BP 137/80   Pulse 81   Temp 36.9 C   Resp 20   Ht 5\' 7"  (1.702 m)   Wt 181 lb 1.6 oz (82.1 kg)   SpO2 95%   BMI 28.36 kg/m    PROVIDERS: PCP is Dr. Delena Bali in [place] EP cardiologist is Cristopher Peru, MD. Pt cleared for surgery at acceptable risk at last office visit 10/01/17 with Daune Perch, NP Hematologist is Nicholas Lose, MD. At last office visit 10/22/17, Dr. Lindi Adie provided eliquis/lovenox instructions for surgery   LABS: Labs reviewed: Acceptable for surgery.  - PT/INR will be obtained day of surgery  (all labs ordered are listed, but only abnormal results are displayed)  Labs Reviewed  CBC - Abnormal; Notable for the following components:      Result Value   Platelets 138 (*)    All other components within normal limits  SURGICAL PCR SCREEN  BASIC METABOLIC PANEL    EKG 1/69/67: NSR.  Cannot rule out anterior infarct, age undetermined   CV:  Perioperative prescription form for ICD indicates procedure may interfere with device function.  Magnet should be placed over device during procedure.  Postop interrogation is not needed.   Past Medical History:  Diagnosis Date  . AICD (automatic cardioverter/defibrillator) present    Medtronic   . Atrial fibrillation ([place] [facility])   . CAD (coronary artery disease)    MI and 2 stents 1995 in [place]  . CHF (congestive heart failure) (HCC)    mild   . Depression   . 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  Marland Kitchen Dyslipidemia   . Headache    migraines  . Ischemic cardiomyopathy   . Myocardial infarct ([facility]) 1995   MI and 2 stents in 1995 in [place]  . Presence of permanent cardiac pacemaker   . Pulmonary embolism ([facility]) 2012  . Sleep apnea    no CPAP  . Ventricular tachycardia [facility])     Past Surgical History:  Procedure Laterality Date  . CARDIAC DEFIBRILLATOR PLACEMENT    . IMPLANTABLE CARDIOVERTER DEFIBRILLATOR GENERATOR CHANGE N/A 7/Rachel/2014   Procedure: IMPLANTABLE CARDIOVERTER DEFIBRILLATOR GENERATOR CHANGE;  Surgeon: Evans Lance, MD;  Location: [facility] CATH LAB;  Service: Cardiovascular;  Laterality: N/A;  . SHOULDER ARTHROSCOPY     right  . TUBAL LIGATION      MEDICATIONS: . apixaban (ELIQUIS) 5 MG TABS tablet  . B Complex-C-Folic Acid (STRESS FORMULA) TABS  . baclofen (LIORESAL) 10 MG tablet  . Calcium Carbonate-Vitamin D (CALCIUM-D) 600-400 MG-UNIT TABS  . carvedilol (COREG) 12.5 MG tablet  . Cholecalciferol (VITAMIN D-3) 1000 UNITS CAPS  . diphenhydrAMINE (BENADRYL) 25 mg capsule  . FIBER PO  . Omega-3 Fatty Acids (FISH OIL) 1000 MG CAPS  . ramipril (ALTACE) 2.5 MG tablet  .  sertraline (ZOLOFT) 100 MG tablet  . simvastatin (ZOCOR) 40 MG tablet  . vitamin E 400 UNIT capsule  . zonisamide (ZONEGRAN) 50 MG capsule   No current facility-administered medications for this encounter.    - Pt to hold eliquis 3 days before surgery with lovenox bridge   If labs acceptable day of surgery, I anticipate pt can proceed with surgery as scheduled.  Willeen Cass, FNP-BC [facility]/Anesthesiology Phone: [phone] 12/10/2017 2:36 PM

## 2017-12-10 NOTE — Care Plan (Signed)
Spoke with patient prior to surgery. Will discharge to home with family and HHPT provided by [facility]. She has a rolling walker and a 3n1 was ordered to be delivered to her room from [facility]. She is scheduled to follow up with Dr. Mardelle Matte on 01/02/18 @ 415. She will transition to [facility] on 01/03/18 @ 1000 at [facility] in [place].  Patient is aware and agreeable with plan.   Please contact Ladell Heads, [place] with questions or if this plan should need to change.   Thank you

## 2017-12-10 NOTE — Progress Notes (Signed)
Rachel Bates with [employer] notified of surgery. Need to contact her when orders received for ICD from Dr. Lovena Le

## 2017-12-11 ENCOUNTER — Other Ambulatory Visit: Payer: Self-pay

## 2017-12-11 ENCOUNTER — Ambulatory Visit (INDEPENDENT_AMBULATORY_CARE_PROVIDER_SITE_OTHER): Payer: Medicare Other | Admitting: *Deleted

## 2017-12-11 DIAGNOSIS — I472 Ventricular tachycardia, unspecified: Secondary | ICD-10-CM

## 2017-12-11 MED ORDER — ENOXAPARIN SODIUM 80 MG/0.8ML ~~LOC~~ SOLN: SUBCUTANEOUS | 0 refills | Status: DC

## 2017-12-11 NOTE — Progress Notes (Signed)
Remote ICD transmission.   

## 2017-12-11 NOTE — Telephone Encounter (Signed)
Called pt to inform her Lovenox script sent to her pharmacy. She is to start injection twice a day on  06/08 -06/10. Nothing on day of surgery and restart Eliquis after sx per  Dr. Lindi Adie. No further questions.  Cyndia Bent RN

## 2017-12-18 ENCOUNTER — Encounter (HOSPITAL_COMMUNITY)
Admission: RE | Disposition: A | Discharge status: Home Health | Payer: Self-pay | Source: Ambulatory Visit | Attending: Orthopedic Surgery

## 2017-12-18 ENCOUNTER — Inpatient Hospital Stay (HOSPITAL_COMMUNITY): Payer: Medicare Other | Admitting: Emergency Medicine

## 2017-12-18 ENCOUNTER — Inpatient Hospital Stay (HOSPITAL_COMMUNITY): Payer: Medicare Other | Admitting: Anesthesiology

## 2017-12-18 ENCOUNTER — Inpatient Hospital Stay (HOSPITAL_COMMUNITY)
Admission: RE | Admit: 2017-12-18 | Discharge: 2017-12-20 | DRG: 470 | Disposition: A | Discharge status: Home Health | Payer: Medicare Other | Source: Ambulatory Visit | Attending: Orthopedic Surgery | Admitting: Orthopedic Surgery

## 2017-12-18 ENCOUNTER — Inpatient Hospital Stay (HOSPITAL_COMMUNITY): Payer: Medicare Other

## 2017-12-18 ENCOUNTER — Encounter (HOSPITAL_COMMUNITY): Payer: Self-pay

## 2017-12-18 ENCOUNTER — Encounter: Payer: Medicare Other | Admitting: Internal Medicine

## 2017-12-18 ENCOUNTER — Other Ambulatory Visit: Payer: Self-pay

## 2017-12-18 DIAGNOSIS — Z86718 Personal history of other venous thrombosis and embolism: Secondary | ICD-10-CM

## 2017-12-18 DIAGNOSIS — Z96649 Presence of unspecified artificial hip joint: Secondary | ICD-10-CM

## 2017-12-18 DIAGNOSIS — Z87891 Personal history of nicotine dependence: Secondary | ICD-10-CM | POA: Diagnosis not present

## 2017-12-18 DIAGNOSIS — F329 Major depressive disorder, single episode, unspecified: Secondary | ICD-10-CM | POA: Diagnosis present

## 2017-12-18 DIAGNOSIS — I2510 Atherosclerotic heart disease of native coronary artery without angina pectoris: Secondary | ICD-10-CM | POA: Diagnosis present

## 2017-12-18 DIAGNOSIS — Z95810 Presence of automatic (implantable) cardiac defibrillator: Secondary | ICD-10-CM | POA: Diagnosis not present

## 2017-12-18 DIAGNOSIS — M1611 Unilateral primary osteoarthritis, right hip: Secondary | ICD-10-CM | POA: Diagnosis present

## 2017-12-18 DIAGNOSIS — I480 Paroxysmal atrial fibrillation: Secondary | ICD-10-CM | POA: Diagnosis not present

## 2017-12-18 DIAGNOSIS — M1612 Unilateral primary osteoarthritis, left hip: Secondary | ICD-10-CM | POA: Diagnosis present

## 2017-12-18 DIAGNOSIS — E785 Hyperlipidemia, unspecified: Secondary | ICD-10-CM | POA: Diagnosis present

## 2017-12-18 DIAGNOSIS — M1610 Unilateral primary osteoarthritis, unspecified hip: Secondary | ICD-10-CM | POA: Diagnosis present

## 2017-12-18 DIAGNOSIS — G4730 Sleep apnea, unspecified: Secondary | ICD-10-CM | POA: Diagnosis present

## 2017-12-18 DIAGNOSIS — I509 Heart failure, unspecified: Secondary | ICD-10-CM | POA: Diagnosis present

## 2017-12-18 DIAGNOSIS — Z7901 Long term (current) use of anticoagulants: Secondary | ICD-10-CM

## 2017-12-18 DIAGNOSIS — Z471 Aftercare following joint replacement surgery: Secondary | ICD-10-CM | POA: Diagnosis not present

## 2017-12-18 DIAGNOSIS — Z79899 Other long term (current) drug therapy: Secondary | ICD-10-CM | POA: Diagnosis not present

## 2017-12-18 DIAGNOSIS — I252 Old myocardial infarction: Secondary | ICD-10-CM

## 2017-12-18 DIAGNOSIS — I4891 Unspecified atrial fibrillation: Secondary | ICD-10-CM | POA: Diagnosis present

## 2017-12-18 DIAGNOSIS — Z86711 Personal history of pulmonary embolism: Secondary | ICD-10-CM

## 2017-12-18 DIAGNOSIS — Z96641 Presence of right artificial hip joint: Secondary | ICD-10-CM | POA: Diagnosis not present

## 2017-12-18 DIAGNOSIS — I255 Ischemic cardiomyopathy: Secondary | ICD-10-CM | POA: Diagnosis present

## 2017-12-18 HISTORY — DX: Unilateral primary osteoarthritis, right hip: M16.11

## 2017-12-18 HISTORY — PX: TOTAL HIP ARTHROPLASTY: SHX124

## 2017-12-18 LAB — PROTIME-INR
INR: 1.01
Prothrombin Time: 13.2 seconds (ref 11.4–15.2)

## 2017-12-18 SURGERY — ARTHROPLASTY, HIP, TOTAL,POSTERIOR APPROACH
Anesthesia: Choice | Site: Hip | Laterality: Right

## 2017-12-18 MED ORDER — ONDANSETRON HCL 4 MG/2ML IJ SOLN
4.0000 mg | Freq: Four times a day (QID) | INTRAMUSCULAR | Status: DC | PRN
  Administered 2017-12-18: 4 mg via INTRAVENOUS
  Filled 2017-12-18: qty 2

## 2017-12-18 MED ORDER — DEXAMETHASONE SODIUM PHOSPHATE 10 MG/ML IJ SOLN
10.0000 mg | Freq: Once | INTRAMUSCULAR | Status: AC
  Administered 2017-12-19: 10 mg via INTRAVENOUS
  Filled 2017-12-18: qty 1

## 2017-12-18 MED ORDER — MAGNESIUM CITRATE PO SOLN: 1.0000 | Freq: Once | ORAL | Status: DC | PRN

## 2017-12-18 MED ORDER — CALCIUM-D 600-400 MG-UNIT PO TABS: 1.0000 | ORAL_TABLET | Freq: Two times a day (BID) | ORAL | Status: DC

## 2017-12-18 MED ORDER — VITAMIN D 1000 UNITS PO TABS
1000.0000 [IU] | ORAL_TABLET | Freq: Every day | ORAL | Status: DC
  Administered 2017-12-19 – 2017-12-20 (×2): 1000 [IU] via ORAL
  Filled 2017-12-18 (×2): qty 1

## 2017-12-18 MED ORDER — DOCUSATE SODIUM 100 MG PO CAPS
100.0000 mg | ORAL_CAPSULE | Freq: Two times a day (BID) | ORAL | Status: DC
  Administered 2017-12-18 – 2017-12-20 (×4): 100 mg via ORAL
  Filled 2017-12-18 (×4): qty 1

## 2017-12-18 MED ORDER — STRESS FORMULA PO TABS: 1.0000 | ORAL_TABLET | Freq: Every day | ORAL | Status: DC

## 2017-12-18 MED ORDER — SIMVASTATIN 40 MG PO TABS
40.0000 mg | ORAL_TABLET | Freq: Every day | ORAL | Status: DC
  Administered 2017-12-18 – 2017-12-19 (×2): 40 mg via ORAL
  Filled 2017-12-18 (×2): qty 1

## 2017-12-18 MED ORDER — HYDROCODONE-ACETAMINOPHEN 7.5-325 MG PO TABS
1.0000 | ORAL_TABLET | ORAL | Status: DC | PRN
  Administered 2017-12-18 – 2017-12-20 (×4): 1 via ORAL
  Filled 2017-12-18 (×4): qty 1

## 2017-12-18 MED ORDER — CHLORHEXIDINE GLUCONATE 4 % EX LIQD: 60.0000 mL | Freq: Once | CUTANEOUS | Status: DC

## 2017-12-18 MED ORDER — MENTHOL 3 MG MT LOZG: 1.0000 | LOZENGE | OROMUCOSAL | Status: DC | PRN

## 2017-12-18 MED ORDER — METOCLOPRAMIDE HCL 5 MG/ML IJ SOLN: 5.0000 mg | Freq: Three times a day (TID) | INTRAMUSCULAR | Status: DC | PRN

## 2017-12-18 MED ORDER — METHOCARBAMOL 500 MG PO TABS
ORAL_TABLET | ORAL | Status: AC
  Administered 2017-12-18: 500 mg via ORAL
  Filled 2017-12-18: qty 1

## 2017-12-18 MED ORDER — SUGAMMADEX SODIUM 200 MG/2ML IV SOLN
INTRAVENOUS | Status: DC | PRN
  Administered 2017-12-18: 200 mg via INTRAVENOUS

## 2017-12-18 MED ORDER — SENNA-DOCUSATE SODIUM 8.6-50 MG PO TABS: 2.0000 | ORAL_TABLET | Freq: Every day | ORAL | 1 refills | Status: DC

## 2017-12-18 MED ORDER — LIDOCAINE 2% (20 MG/ML) 5 ML SYRINGE
INTRAMUSCULAR | Status: AC
  Filled 2017-12-18: qty 5

## 2017-12-18 MED ORDER — HYDROMORPHONE HCL 2 MG/ML IJ SOLN
0.2500 mg | INTRAMUSCULAR | Status: DC | PRN
  Administered 2017-12-18 (×4): 0.5 mg via INTRAVENOUS

## 2017-12-18 MED ORDER — ALUM & MAG HYDROXIDE-SIMETH 200-200-20 MG/5ML PO SUSP: 30.0000 mL | ORAL | Status: DC | PRN

## 2017-12-18 MED ORDER — MORPHINE SULFATE (PF) 2 MG/ML IV SOLN: 0.5000 mg | INTRAVENOUS | Status: DC | PRN

## 2017-12-18 MED ORDER — ZONISAMIDE 100 MG PO CAPS
100.0000 mg | ORAL_CAPSULE | Freq: Every day | ORAL | Status: DC
  Administered 2017-12-19 – 2017-12-20 (×2): 100 mg via ORAL
  Filled 2017-12-18 (×2): qty 4
  Filled 2017-12-18: qty 1

## 2017-12-18 MED ORDER — DEXAMETHASONE SODIUM PHOSPHATE 10 MG/ML IJ SOLN
INTRAMUSCULAR | Status: AC
  Filled 2017-12-18: qty 1

## 2017-12-18 MED ORDER — OMEGA-3-ACID ETHYL ESTERS 1 G PO CAPS
1.0000 g | ORAL_CAPSULE | Freq: Every day | ORAL | Status: DC
  Administered 2017-12-19 – 2017-12-20 (×2): 1 g via ORAL
  Filled 2017-12-18 (×2): qty 1

## 2017-12-18 MED ORDER — ONDANSETRON HCL 4 MG/2ML IJ SOLN
INTRAMUSCULAR | Status: AC
  Administered 2017-12-18: 4 mg via INTRAVENOUS
  Filled 2017-12-18: qty 2

## 2017-12-18 MED ORDER — BISACODYL 10 MG RE SUPP: 10.0000 mg | Freq: Every day | RECTAL | Status: DC | PRN

## 2017-12-18 MED ORDER — EPHEDRINE SULFATE 50 MG/ML IJ SOLN
INTRAMUSCULAR | Status: DC | PRN
  Administered 2017-12-18 (×2): 7.5 mg via INTRAVENOUS

## 2017-12-18 MED ORDER — METOCLOPRAMIDE HCL 5 MG PO TABS: 5.0000 mg | ORAL_TABLET | Freq: Three times a day (TID) | ORAL | Status: DC | PRN

## 2017-12-18 MED ORDER — HYDROMORPHONE HCL 1 MG/ML IJ SOLN
INTRAMUSCULAR | Status: AC
  Filled 2017-12-18: qty 0.5

## 2017-12-18 MED ORDER — PHENYLEPHRINE HCL 10 MG/ML IJ SOLN
INTRAVENOUS | Status: DC | PRN
  Administered 2017-12-18: 50 ug/min via INTRAVENOUS

## 2017-12-18 MED ORDER — ROCURONIUM BROMIDE 10 MG/ML (PF) SYRINGE
PREFILLED_SYRINGE | INTRAVENOUS | Status: AC
  Filled 2017-12-18: qty 10

## 2017-12-18 MED ORDER — CEFAZOLIN SODIUM-DEXTROSE 2-4 GM/100ML-% IV SOLN
2.0000 g | Freq: Four times a day (QID) | INTRAVENOUS | Status: AC
  Administered 2017-12-18 (×2): 2 g via INTRAVENOUS
  Filled 2017-12-18 (×2): qty 100

## 2017-12-18 MED ORDER — SODIUM CHLORIDE 0.9 % IR SOLN
Status: DC | PRN
  Administered 2017-12-18: 3000 mL

## 2017-12-18 MED ORDER — HYDROCODONE-ACETAMINOPHEN 5-325 MG PO TABS
1.0000 | ORAL_TABLET | ORAL | Status: DC | PRN
  Administered 2017-12-18: 2 via ORAL
  Administered 2017-12-19: 1 via ORAL
  Administered 2017-12-19: 2 via ORAL
  Filled 2017-12-18: qty 1
  Filled 2017-12-18: qty 2

## 2017-12-18 MED ORDER — APIXABAN 5 MG PO TABS
5.0000 mg | ORAL_TABLET | Freq: Two times a day (BID) | ORAL | Status: DC
  Administered 2017-12-19 – 2017-12-20 (×3): 5 mg via ORAL
  Filled 2017-12-18 (×3): qty 1

## 2017-12-18 MED ORDER — METHOCARBAMOL 500 MG PO TABS
500.0000 mg | ORAL_TABLET | Freq: Four times a day (QID) | ORAL | Status: DC | PRN
  Administered 2017-12-18 (×2): 500 mg via ORAL
  Filled 2017-12-18: qty 1

## 2017-12-18 MED ORDER — HYDROMORPHONE HCL 2 MG/ML IJ SOLN
INTRAMUSCULAR | Status: AC
  Administered 2017-12-18: 0.5 mg via INTRAVENOUS
  Filled 2017-12-18: qty 1

## 2017-12-18 MED ORDER — FENTANYL CITRATE (PF) 250 MCG/5ML IJ SOLN
INTRAMUSCULAR | Status: AC
  Filled 2017-12-18: qty 5

## 2017-12-18 MED ORDER — ACETAMINOPHEN 500 MG PO TABS
500.0000 mg | ORAL_TABLET | Freq: Four times a day (QID) | ORAL | Status: AC
  Administered 2017-12-18 – 2017-12-19 (×3): 500 mg via ORAL
  Filled 2017-12-18 (×3): qty 1

## 2017-12-18 MED ORDER — MIDAZOLAM HCL 5 MG/5ML IJ SOLN
INTRAMUSCULAR | Status: DC | PRN
  Administered 2017-12-18: 1 mg via INTRAVENOUS

## 2017-12-18 MED ORDER — ONDANSETRON HCL 4 MG/2ML IJ SOLN
4.0000 mg | Freq: Once | INTRAMUSCULAR | Status: AC | PRN
  Administered 2017-12-18: 4 mg via INTRAVENOUS

## 2017-12-18 MED ORDER — POLYETHYLENE GLYCOL 3350 17 G PO PACK: 17.0000 g | PACK | Freq: Every day | ORAL | Status: DC | PRN

## 2017-12-18 MED ORDER — DEXAMETHASONE SODIUM PHOSPHATE 10 MG/ML IJ SOLN
INTRAMUSCULAR | Status: DC | PRN
  Administered 2017-12-18: 8 mg via INTRAVENOUS

## 2017-12-18 MED ORDER — ZOLPIDEM TARTRATE 5 MG PO TABS: 5.0000 mg | ORAL_TABLET | Freq: Every evening | ORAL | Status: DC | PRN

## 2017-12-18 MED ORDER — PROPOFOL 10 MG/ML IV BOLUS
INTRAVENOUS | Status: AC
Start: 2017-12-18 — End: ?
  Filled 2017-12-18: qty 20

## 2017-12-18 MED ORDER — SUGAMMADEX SODIUM 200 MG/2ML IV SOLN
INTRAVENOUS | Status: AC
  Filled 2017-12-18: qty 2

## 2017-12-18 MED ORDER — LACTATED RINGERS IV SOLN
INTRAVENOUS | Status: DC | PRN
  Administered 2017-12-18 (×2): via INTRAVENOUS

## 2017-12-18 MED ORDER — ROCURONIUM BROMIDE 10 MG/ML (PF) SYRINGE
PREFILLED_SYRINGE | INTRAVENOUS | Status: DC | PRN
  Administered 2017-12-18: 20 mg via INTRAVENOUS
  Administered 2017-12-18: 50 mg via INTRAVENOUS

## 2017-12-18 MED ORDER — DIPHENHYDRAMINE HCL 12.5 MG/5ML PO ELIX
12.5000 mg | ORAL_SOLUTION | ORAL | Status: DC | PRN
  Administered 2017-12-18: 25 mg via ORAL
  Filled 2017-12-18: qty 10

## 2017-12-18 MED ORDER — RAMIPRIL 2.5 MG PO CAPS
2.5000 mg | ORAL_CAPSULE | Freq: Every day | ORAL | Status: DC
  Administered 2017-12-19 – 2017-12-20 (×2): 2.5 mg via ORAL
  Filled 2017-12-18 (×2): qty 1

## 2017-12-18 MED ORDER — ALBUMIN HUMAN 5 % IV SOLN
INTRAVENOUS | Status: DC | PRN
  Administered 2017-12-18: 10:00:00 via INTRAVENOUS

## 2017-12-18 MED ORDER — FIBER PO POWD: Freq: Every day | ORAL | Status: DC

## 2017-12-18 MED ORDER — OXYCODONE HCL 5 MG/5ML PO SOLN: 5.0000 mg | Freq: Once | ORAL | Status: DC | PRN

## 2017-12-18 MED ORDER — BACLOFEN 10 MG PO TABS: 10.0000 mg | ORAL_TABLET | Freq: Every day | ORAL | 0 refills | Status: DC | PRN

## 2017-12-18 MED ORDER — PROPOFOL 10 MG/ML IV BOLUS
INTRAVENOUS | Status: AC
  Filled 2017-12-18: qty 20

## 2017-12-18 MED ORDER — ONDANSETRON HCL 4 MG/2ML IJ SOLN
INTRAMUSCULAR | Status: DC | PRN
  Administered 2017-12-18: 4 mg via INTRAVENOUS

## 2017-12-18 MED ORDER — ONDANSETRON HCL 4 MG/2ML IJ SOLN
INTRAMUSCULAR | Status: AC
  Filled 2017-12-18: qty 2

## 2017-12-18 MED ORDER — PROPOFOL 10 MG/ML IV BOLUS
INTRAVENOUS | Status: DC | PRN
  Administered 2017-12-18: 200 mg via INTRAVENOUS

## 2017-12-18 MED ORDER — ONDANSETRON HCL 4 MG PO TABS: 4.0000 mg | ORAL_TABLET | Freq: Four times a day (QID) | ORAL | Status: DC | PRN

## 2017-12-18 MED ORDER — METHOCARBAMOL 1000 MG/10ML IJ SOLN
500.0000 mg | Freq: Four times a day (QID) | INTRAMUSCULAR | Status: DC | PRN
  Filled 2017-12-18: qty 5

## 2017-12-18 MED ORDER — MIDAZOLAM HCL 2 MG/2ML IJ SOLN
INTRAMUSCULAR | Status: AC
  Filled 2017-12-18: qty 2

## 2017-12-18 MED ORDER — 0.9 % SODIUM CHLORIDE (POUR BTL) OPTIME
TOPICAL | Status: DC | PRN
  Administered 2017-12-18: 1000 mL

## 2017-12-18 MED ORDER — BUPIVACAINE HCL (PF) 0.25 % IJ SOLN
INTRAMUSCULAR | Status: AC
  Filled 2017-12-18: qty 30

## 2017-12-18 MED ORDER — HYDROCODONE-ACETAMINOPHEN 10-325 MG PO TABS: 1.0000 | ORAL_TABLET | Freq: Four times a day (QID) | ORAL | 0 refills | Status: DC | PRN

## 2017-12-18 MED ORDER — ACETAMINOPHEN 325 MG PO TABS
325.0000 mg | ORAL_TABLET | Freq: Four times a day (QID) | ORAL | Status: DC | PRN
  Administered 2017-12-20: 650 mg via ORAL
  Filled 2017-12-18: qty 2

## 2017-12-18 MED ORDER — FENTANYL CITRATE (PF) 250 MCG/5ML IJ SOLN
INTRAMUSCULAR | Status: DC | PRN
  Administered 2017-12-18: 100 ug via INTRAVENOUS
  Administered 2017-12-18 (×3): 50 ug via INTRAVENOUS

## 2017-12-18 MED ORDER — HYDROMORPHONE HCL 1 MG/ML IJ SOLN
INTRAMUSCULAR | Status: DC | PRN
  Administered 2017-12-18: .5 mg via INTRAVENOUS
  Administered 2017-12-18: 0.5 mg via INTRAVENOUS

## 2017-12-18 MED ORDER — CARVEDILOL 12.5 MG PO TABS
12.5000 mg | ORAL_TABLET | Freq: Two times a day (BID) | ORAL | Status: DC
  Administered 2017-12-18 – 2017-12-20 (×4): 12.5 mg via ORAL
  Filled 2017-12-18 (×4): qty 1

## 2017-12-18 MED ORDER — HYDROCODONE-ACETAMINOPHEN 5-325 MG PO TABS
ORAL_TABLET | ORAL | Status: AC
  Administered 2017-12-18: 2 via ORAL
  Filled 2017-12-18: qty 2

## 2017-12-18 MED ORDER — SERTRALINE HCL 100 MG PO TABS
100.0000 mg | ORAL_TABLET | Freq: Every day | ORAL | Status: DC
  Administered 2017-12-18 – 2017-12-19 (×2): 100 mg via ORAL
  Filled 2017-12-18 (×2): qty 1

## 2017-12-18 MED ORDER — PHENOL 1.4 % MT LIQD: 1.0000 | OROMUCOSAL | Status: DC | PRN

## 2017-12-18 MED ORDER — OXYCODONE HCL 5 MG PO TABS: 5.0000 mg | ORAL_TABLET | Freq: Once | ORAL | Status: DC | PRN

## 2017-12-18 MED ORDER — LIDOCAINE 2% (20 MG/ML) 5 ML SYRINGE
INTRAMUSCULAR | Status: DC | PRN
  Administered 2017-12-18: 100 mg via INTRAVENOUS

## 2017-12-18 MED ORDER — VITAMIN E 180 MG (400 UNIT) PO CAPS: 400.0000 [IU] | ORAL_CAPSULE | Freq: Every day | ORAL | Status: DC

## 2017-12-18 MED ORDER — DIPHENHYDRAMINE HCL 25 MG PO CAPS
25.0000 mg | ORAL_CAPSULE | Freq: Every day | ORAL | Status: DC
  Administered 2017-12-19 – 2017-12-20 (×2): 25 mg via ORAL
  Filled 2017-12-18 (×2): qty 1

## 2017-12-18 MED ORDER — POTASSIUM CHLORIDE IN NACL 20-0.45 MEQ/L-% IV SOLN
INTRAVENOUS | Status: DC
  Administered 2017-12-18: 17:00:00 via INTRAVENOUS
  Filled 2017-12-18 (×2): qty 1000

## 2017-12-18 MED ORDER — BUPIVACAINE HCL (PF) 0.25 % IJ SOLN
INTRAMUSCULAR | Status: DC | PRN
  Administered 2017-12-18: 30 mL

## 2017-12-18 MED ORDER — PHENYLEPHRINE 40 MCG/ML (10ML) SYRINGE FOR IV PUSH (FOR BLOOD PRESSURE SUPPORT)
PREFILLED_SYRINGE | INTRAVENOUS | Status: AC
  Filled 2017-12-18: qty 10

## 2017-12-18 MED ORDER — CEFAZOLIN SODIUM-DEXTROSE 2-4 GM/100ML-% IV SOLN
2.0000 g | INTRAVENOUS | Status: AC
  Administered 2017-12-18: 2 g via INTRAVENOUS
  Filled 2017-12-18: qty 100

## 2017-12-18 SURGICAL SUPPLY — 51 items
BIT DRILL 5/64X5 DISP (BIT) ×3 IMPLANT
BLADE SAW SAG 73X25 THK (BLADE) ×1
BLADE SAW SGTL 73X25 THK (BLADE) ×2 IMPLANT
CAPT HIP TOTAL 2 ×2 IMPLANT
CLOSURE STERI-STRIP 1/2X4 (GAUZE/BANDAGES/DRESSINGS) ×1
CLSR STERI-STRIP ANTIMIC 1/2X4 (GAUZE/BANDAGES/DRESSINGS) ×3 IMPLANT
COVER SURGICAL LIGHT HANDLE (MISCELLANEOUS) ×3 IMPLANT
DRAPE INCISE IOBAN 66X45 STRL (DRAPES) ×4 IMPLANT
DRAPE ORTHO SPLIT 77X108 STRL (DRAPES) ×6
DRAPE SURG ORHT 6 SPLT 77X108 (DRAPES) ×2 IMPLANT
DRAPE U-SHAPE 47X51 STRL (DRAPES) ×3 IMPLANT
DRSG MEPILEX BORDER 4X12 (GAUZE/BANDAGES/DRESSINGS) IMPLANT
DRSG MEPILEX BORDER 4X8 (GAUZE/BANDAGES/DRESSINGS) ×2 IMPLANT
DURAPREP 26ML APPLICATOR (WOUND CARE) ×5 IMPLANT
ELECT CAUTERY BLADE 6.4 (BLADE) ×3 IMPLANT
ELECT REM PT RETURN 9FT ADLT (ELECTROSURGICAL) ×3
ELECTRODE REM PT RTRN 9FT ADLT (ELECTROSURGICAL) ×1 IMPLANT
GLOVE BIOGEL PI ORTHO PRO SZ8 (GLOVE) ×4
GLOVE ORTHO TXT STRL SZ7.5 (GLOVE) ×3 IMPLANT
GLOVE PI ORTHO PRO STRL SZ8 (GLOVE) ×2 IMPLANT
GLOVE SURG ORTHO 8.0 STRL STRW (GLOVE) ×3 IMPLANT
GOWN STRL REUS W/ TWL XL LVL3 (GOWN DISPOSABLE) ×1 IMPLANT
GOWN STRL REUS W/TWL 2XL LVL3 (GOWN DISPOSABLE) ×3 IMPLANT
GOWN STRL REUS W/TWL XL LVL3 (GOWN DISPOSABLE) ×3
HOOD PEEL AWAY FACE SHEILD DIS (HOOD) ×5 IMPLANT
HOOD PEEL AWAY FLYTE STAYCOOL (MISCELLANEOUS) ×3 IMPLANT
KIT BASIN OR (CUSTOM PROCEDURE TRAY) ×3 IMPLANT
KIT TURNOVER KIT B (KITS) ×3 IMPLANT
MANIFOLD NEPTUNE II (INSTRUMENTS) ×3 IMPLANT
NDL SAFETY ECLIPSE 18X1.5 (NEEDLE) ×1 IMPLANT
NEEDLE HYPO 18GX1.5 SHARP (NEEDLE) ×3
NS IRRIG 1000ML POUR BTL (IV SOLUTION) ×3 IMPLANT
PACK TOTAL JOINT (CUSTOM PROCEDURE TRAY) ×3 IMPLANT
PAD ARMBOARD 7.5X6 YLW CONV (MISCELLANEOUS) ×6 IMPLANT
PILLOW ABDUCTION HIP (SOFTGOODS) ×1 IMPLANT
PRESSURIZER FEMORAL UNIV (MISCELLANEOUS) IMPLANT
RETRIEVER SUT HEWSON (MISCELLANEOUS) ×3 IMPLANT
SUCTION FRAZIER HANDLE 10FR (MISCELLANEOUS) ×2
SUCTION TUBE FRAZIER 10FR DISP (MISCELLANEOUS) ×1 IMPLANT
SUT FIBERWIRE #2 38 REV NDL BL (SUTURE) ×9
SUT VIC AB 0 CT1 27 (SUTURE) ×6
SUT VIC AB 0 CT1 27XBRD ANBCTR (SUTURE) ×1 IMPLANT
SUT VIC AB 2-0 CT1 27 (SUTURE) ×3
SUT VIC AB 2-0 CT1 TAPERPNT 27 (SUTURE) ×1 IMPLANT
SUT VIC AB 3-0 SH 8-18 (SUTURE) ×3 IMPLANT
SUTURE FIBERWR#2 38 REV NDL BL (SUTURE) ×3 IMPLANT
SYR CONTROL 10ML LL (SYRINGE) ×3 IMPLANT
TOWEL OR 17X24 6PK STRL BLUE (TOWEL DISPOSABLE) ×3 IMPLANT
TOWEL OR 17X26 10 PK STRL BLUE (TOWEL DISPOSABLE) ×3 IMPLANT
TRAY CATH 16FR W/PLASTIC CATH (SET/KITS/TRAYS/PACK) IMPLANT
TRAY FOLEY W/BAG SLVR 14FR (SET/KITS/TRAYS/PACK) IMPLANT

## 2017-12-18 NOTE — Discharge Instructions (Signed)

## 2017-12-18 NOTE — Op Note (Signed)
12/18/2017  10:20 AM  PATIENT:  Rachel Bates   MRN: [id number]  PRE-OPERATIVE DIAGNOSIS: Right hip primary localized osteoarthritis  POST-OPERATIVE DIAGNOSIS:  same  PROCEDURE:  Procedure(s): RIGHT TOTAL HIP ARTHROPLASTY  PREOPERATIVE INDICATIONS:    [name] is an 72 y.o. female who has a diagnosis of right hip arthritis and elected for surgical management after failing conservative treatment.  The risks benefits and alternatives were discussed with the patient including but not limited to the risks of nonoperative treatment, versus surgical intervention including infection, bleeding, nerve injury, periprosthetic fracture, the need for revision surgery, dislocation, leg length discrepancy, blood clots, cardiopulmonary complications, morbidity, mortality, among others, and they were willing to proceed.     OPERATIVE REPORT     SURGEON:  Marchia Bond, MD    ASSISTANT:  Joya Gaskins, OPA-C  (Present throughout the entire procedure,  necessary for completion of procedure in a timely manner, assisting with retraction, instrumentation, and closure)     ANESTHESIA: General  ESTIMATED BLOOD LOSS: 409 mL    COMPLICATIONS:  None.     UNIQUE ASPECTS OF THE CASE: I had excellent access to the acetabulum.  I had more bone to medialize if needed, however I had good coverage and good fixation without having to go to the full depth.  I did use an augmentation screw for fixation, although I had good press-fit fixation.  The screw did not have the greatest bite, her bone quality was mediocre.  The soft tissue repair came back to bone the and, although it was slightly tight, I think she had been contracted and shortened for a while.  COMPONENTS:  [employer]uy [employer] fit femur size 5 with a 36 mm + 5 metallic head ball and a Gription Acetabular shell size 54, with a single cancellous screw for backup fixation, with an apex hole eliminator and a +4 neutral polyethylene liner.     PROCEDURE IN DETAIL:   The patient was met in the holding area and  identified.  The appropriate hip was identified and marked at the operative site.  The patient was then transported to the OR  and  placed under anesthesia.  At that point, the patient was  placed in the lateral decubitus position with the operative side up and  secured to the operating room table and all bony prominences padded.     The operative lower extremity was prepped from the iliac crest to the distal leg.  Sterile draping was performed.  Time out was performed prior to incision.      A routine posterolateral approach was utilized via sharp dissection  carried down to the subcutaneous tissue.  Gross bleeders were Bovie coagulated.  The iliotibial band was identified and incised along the length of the skin incision.  Self-retaining retractors were  inserted.  With the hip internally rotated, the short external rotators  were identified. The piriformis and capsule was tagged with FiberWire, and the hip capsule released in a T-type fashion.  The femoral neck was exposed, and I resected the femoral neck using the appropriate jig. This was performed at approximately a thumb's breadth above the lesser trochanter.    I then exposed the deep acetabulum, cleared out any tissue including the ligamentum teres.  A wing retractor was placed.  After adequate visualization, I excised the labrum, and then sequentially reamed.  I placed the trial acetabulum, which seated nicely, and then impacted the real cup into place.  Appropriate version and inclination was  confirmed clinically matching their bony anatomy, and also with the use of the jig.  I placed a cancellous screw to augment fixation.  A trial polyethylene liner was placed and the wing retractor removed.    I then prepared the proximal femur using the cookie-cutter, the lateralizing reamer, and then sequentially reamed and broached.  A trial broach, neck, and head was utilized, and  I reduced the hip and it was found to have excellent stability with functional range of motion. The trial components were then removed, and the real polyethylene liner was placed.  I then impacted the real femoral prosthesis into place into the appropriate version, slightly anteverted to the normal anatomy, and I impacted the real head ball into place. The hip was then reduced and taken through functional range of motion and found to have excellent stability. Leg lengths were restored.  I then used a 2 mm drill bits to pass the FiberWire suture from the capsule and piriformis through the greater trochanter, and secured this. Excellent posterior capsular repair was achieved. I also closed the T in the capsule.  I then irrigated the hip copiously again with pulse lavage, and repaired the fascia with Vicryl, followed by Vicryl for the subcutaneous tissue, Monocryl for the skin, Steri-Strips and sterile gauze. The wounds were injected. The patient was then awakened and returned to PACU in stable and satisfactory condition. There were no complications.  Marchia Bond, MD Orthopedic Surgeon [phone]   12/18/2017 10:20 AM

## 2017-12-18 NOTE — Anesthesia Postprocedure Evaluation (Signed)
Anesthesia Post Note  Patient: Rachel Bates  Procedure(s) Performed: RIGHT TOTAL HIP ARTHROPLASTY (Right Hip)     Patient location during evaluation: PACU Anesthesia Type: General Level of consciousness: awake and alert Pain management: pain level controlled Vital Signs Assessment: post-procedure vital signs reviewed and stable Respiratory status: spontaneous breathing, nonlabored ventilation, respiratory function stable and patient connected to nasal cannula oxygen Cardiovascular status: blood pressure returned to baseline and stable Postop Assessment: no apparent nausea or vomiting Anesthetic complications: no    Last Vitals:  Vitals:   12/18/17 1514 12/18/17 1542  BP: (!) 124/54 105/80  Pulse: 77 74  Resp: 14 17  Temp:    SpO2: 92% 99%    Last Pain:  Vitals:   12/18/17 1542  TempSrc:   PainSc: Asleep                 Montrae Braithwaite COKER

## 2017-12-18 NOTE — Anesthesia Preprocedure Evaluation (Signed)
Anesthesia Evaluation  Patient identified by MRN, date of birth, ID band Patient awake    Reviewed: Allergy & Precautions, NPO status , Patient's Chart, lab work & pertinent test results  Airway Mallampati: II  TM Distance: >3 FB Neck ROM: Full    Dental  (+) Teeth Intact   Pulmonary former smoker,    breath sounds clear to auscultation       Cardiovascular  Rhythm:Regular Rate:Normal     Neuro/Psych    GI/Hepatic   Endo/Other    Renal/GU      Musculoskeletal   Abdominal   Peds  Hematology   Anesthesia Other Findings   Reproductive/Obstetrics                             Anesthesia Physical Anesthesia Plan  ASA: III  Anesthesia Plan: General   Post-op Pain Management:    Induction: Intravenous  PONV Risk Score and Plan: Ondansetron and Dexamethasone  Airway Management Planned: Oral ETT  Additional Equipment:   Intra-op Plan:   Post-operative Plan: Extubation in OR  Informed Consent: I have reviewed the patients History and Physical, chart, labs and discussed the procedure including the risks, benefits and alternatives for the proposed anesthesia with the patient or authorized representative who has indicated his/her understanding and acceptance.   Dental advisory given  Plan Discussed with: CRNA and Anesthesiologist  Anesthesia Plan Comments:         Anesthesia Quick Evaluation

## 2017-12-18 NOTE — Anesthesia Procedure Notes (Signed)
Procedure Name: Intubation Date/Time: 12/18/2017 8:33 AM Performed by: Mariea Clonts, CRNA Pre-anesthesia Checklist: Patient identified, Emergency Drugs available, Suction available and Patient being monitored Patient Re-evaluated:Patient Re-evaluated prior to induction Oxygen Delivery Method: Circle System Utilized Preoxygenation: Pre-oxygenation with 100% oxygen Induction Type: IV induction Ventilation: Mask ventilation without difficulty Laryngoscope Size: Mac and 3 Grade View: Grade I Tube type: Oral Tube size: 7.5 mm Number of attempts: 1 Airway Equipment and Method: Stylet and Oral airway Placement Confirmation: ETT inserted through vocal cords under direct vision,  positive ETCO2 and breath sounds checked- equal and bilateral Tube secured with: Tape Dental Injury: Teeth and Oropharynx as per pre-operative assessment

## 2017-12-18 NOTE — H&P (Signed)
PREOPERATIVE H&P  Chief Complaint: Right hip pain  HPI: Rachel Bates is a 72 y.o. female who presents for preoperative history and physical with a diagnosis of right hip primary localized osteoarthritis. Symptoms are rated as moderate to severe, and have been worsening.  This is significantly impairing activities of daily living.  She has elected for surgical management.   She has failed injections, activity modification, she cannot take anti-inflammatories because she is on Eliquis for atrial fibrillation, and assistive devices.  Preoperative X-rays demonstrate end stage degenerative changes with osteophyte formation, loss of joint space, subchondral sclerosis.   Past Medical History:  Diagnosis Date  . AICD (automatic cardioverter/defibrillator) present    [employer]   . Atrial fibrillation ([place])   . CAD (coronary artery disease)    MI and 2 stents 1995 in [place]  . CHF (congestive heart failure) ([facility])    mild  . Depression   . 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  Marland Kitchen Dyslipidemia   . Headache    migraines  . Ischemic cardiomyopathy   . Myocardial infarct ([place]) 1995   MI and 2 stents in 1995 in [place]  . Presence of permanent cardiac pacemaker   . Primary localized osteoarthritis of right hip 12/18/2017  . Pulmonary embolism ([place]) 2012  . Sleep apnea    no CPAP  . Ventricular tachycardia [facility])    Past Surgical History:  Procedure Laterality Date  . CARDIAC DEFIBRILLATOR PLACEMENT    . IMPLANTABLE CARDIOVERTER DEFIBRILLATOR GENERATOR CHANGE N/A 02/04/2013   Procedure: IMPLANTABLE CARDIOVERTER DEFIBRILLATOR GENERATOR CHANGE;  Surgeon: Evans Lance, MD;  Location: [facility] CATH LAB;  Service: Cardiovascular;  Laterality: N/A;  . SHOULDER ARTHROSCOPY     right  . TUBAL LIGATION     Social History   Socioeconomic History  . Marital status: Married    Spouse name: Not on file  . Number of children: Not on file  . Years of education: Not on file   . Highest education level: Not on file  Occupational History  . Not on file  Social Needs  . Financial resource strain: Not on file  . Food insecurity:    Worry: Not on file    Inability: Not on file  . Transportation needs:    Medical: Not on file    Non-medical: Not on file  Tobacco Use  . Smoking status: Former Research scientist (life sciences)  . Smokeless tobacco: Never Used  Substance and Sexual Activity  . Alcohol use: No  . Drug use: No  . Sexual activity: Not on file  Lifestyle  . Physical activity:    Days per week: Not on file    Minutes per session: Not on file  . Stress: Not on file  Relationships  . Social connections:    Talks on phone: Not on file    Gets together: Not on file    Attends religious service: Not on file    Active member of club or organization: Not on file    Attends meetings of clubs or organizations: Not on file    Relationship status: Not on file  Other Topics Concern  . Not on file  Social History Narrative  . Not on file   Family History  Problem Relation Age of Onset  . Hypertension Mother   . Thyroid disease Mother   . Alzheimer's disease Mother   . Coronary artery disease Father   . Pulmonary embolism Father   .  Congestive Heart Failure Maternal Grandmother   . Hypertension Maternal Grandmother   . Heart attack Maternal Grandfather   . Other Maternal Grandfather        carotid disease  . Dementia Paternal Grandmother   . Other Paternal Grandfather 74       accident   Allergies  Allergen Reactions  . Sumatriptan Succinate Other (See Comments)    Chest pain, no triptans, pt states "it makes my heart race"  . Amitriptyline Other (See Comments)    Weight gain   Prior to Admission medications   Medication Sig Start Date End Date Taking? Authorizing Provider  apixaban (ELIQUIS) 5 MG TABS tablet Take 1 tablet (5 mg total) by mouth 2 (two) times daily. 12/11/16  Yes Evans Lance, MD  B Complex-C-Folic Acid (STRESS FORMULA) TABS Take 1 tablet by  mouth daily.    Yes [provider]  baclofen (LIORESAL) 10 MG tablet Take 10 mg by mouth daily as needed (MIGRAINES).  09/13/17  Yes [provider]  Calcium Carbonate-Vitamin D (CALCIUM-D) 600-400 MG-UNIT TABS Take 1 tablet by mouth 2 (two) times daily.   Yes [provider]  carvedilol (COREG) 12.5 MG tablet Take 12.5 mg by mouth 2 (two) times daily with a meal.    Yes [provider]  Cholecalciferol (VITAMIN D-3) 1000 UNITS CAPS Take 1 capsule by mouth daily.   Yes [provider]  diphenhydrAMINE (BENADRYL) 25 mg capsule Take 25 mg by mouth daily.   Yes [provider]  FIBER PO Take 1 capsule by mouth daily.   Yes [provider]  Omega-3 Fatty Acids (FISH OIL) 1000 MG CAPS Take 1 capsule by mouth daily.    Yes [provider]  ramipril (ALTACE) 2.5 MG tablet Take 2.5 mg by mouth daily.     Yes [provider]  sertraline (ZOLOFT) 100 MG tablet Take 100 mg by mouth at bedtime.   Yes [provider]  simvastatin (ZOCOR) 40 MG tablet Take 40 mg by mouth at bedtime.     Yes [provider]  vitamin E 400 UNIT capsule Take 400 Units by mouth daily.   Yes [provider]  zonisamide (ZONEGRAN) 50 MG capsule Take 100 mg by mouth daily.  09/13/17  Yes [provider]  enoxaparin (LOVENOX) 80 MG/0.8ML injection Inject 2 times a day for 3 days. 12/15/17 12/17/17  Nicholas Lose, MD     Positive ROS: All other systems have been reviewed and were otherwise negative with the exception of those mentioned in the HPI and as above.  Physical Exam: General: Alert, no acute distress Cardiovascular: No pedal edema Respiratory: No cyanosis, no use of accessory musculature GI: No organomegaly, abdomen is soft and non-tender Skin: No lesions in the area of chief complaint Neurologic: Sensation intact distally Psychiatric: Patient is competent for consent with normal mood and affect Lymphatic: No  axillary or cervical lymphadenopathy  MUSCULOSKELETAL: Right hip has range of motion 0 to 85 degrees with external rotation to 10 degrees and internal rotation to 0 degrees which re-creates pain.  EHL is intact.  Assessment: Right hip osteoarthritis with atrial fibrillation and history of DVT/PE   Plan: Plan for Procedure(s): RIGHT TOTAL HIP ARTHROPLASTY  The risks benefits and alternatives were discussed with the patient including but not limited to the risks of nonoperative treatment, versus surgical intervention including infection, bleeding, nerve injury, periprosthetic fracture, the need for revision surgery, dislocation, leg length discrepancy, blood clots, cardiopulmonary complications, morbidity,  mortality, among others, and they were willing to proceed.     Preoperative templating of the joint replacement has been completed, documented, and submitted to the Operating Room personnel in order to optimize intra-operative equipment management.  Johnny Bridge, MD Cell [phone]   12/18/2017 7:21 AM

## 2017-12-18 NOTE — Evaluation (Signed)
Physical Therapy Evaluation Patient Details Name: Rachel Bates MRN: [id number] DOB: 08-21-1945 Today's Date: 12/18/2017   History of Present Illness  72 y.o. female admitted on 12/18/17 for elective R THA posterior approach.  Pt with significant PMH of chronic low back pain, ventricular tachycardia, PE, AICD, MI, ischemic cardiomyopathy, DVT, CHF, CAD, A-fib, and R shoulder arthroscopy.  Clinical Impression  Pt was able to get up, walk to the bathroom, and sit up in the recliner chair with min assist overall.  HEP and hip precautions reviewed.   PT to follow acutely for deficits listed below.       Follow Up Recommendations Home health PT;Follow surgeon's recommendation for DC plan and follow-up therapies;Supervision for mobility/OOB    Equipment Recommendations  3in1 (PT);Other (comment)(per pt is to be delivered here? she already has RW)    Recommendations for Other Services OT consult     Precautions / Restrictions Precautions Precautions: Posterior Hip Precaution Booklet Issued: Yes (comment) Precaution Comments: surgical hip exercises and posterior hip precautions handout given and reviewed.  Restrictions Weight Bearing Restrictions: Yes RLE Weight Bearing: Weight bearing as tolerated      Mobility  Bed Mobility Overal bed mobility: Needs Assistance Bed Mobility: Supine to Sit     Supine to sit: Min assist;HOB elevated     General bed mobility comments: Min assist to help progress her right leg to EOB.  Pt using elevated HOB and bed rail for leverage/assist at the trunk.   Transfers Overall transfer level: Needs assistance Equipment used: Rolling walker (2 wheeled) Transfers: Sit to/from Stand Sit to Stand: Min guard         General transfer comment: Min guard assist for safety during transitions, verbal cues for safe hand placement.   Ambulation/Gait Ambulation/Gait assistance: Min guard Ambulation Distance (Feet): 15 Feet Assistive device: Rolling walker  (2 wheeled) Gait Pattern/deviations: Step-to pattern;Antalgic     General Gait Details: Pt needed verbal cues for safe use of RW.           Balance Overall balance assessment: Needs assistance Sitting-balance support: Feet supported;Bilateral upper extremity supported Sitting balance-Leahy Scale: Good     Standing balance support: Single extremity supported;Bilateral upper extremity supported Standing balance-Leahy Scale: Fair                               Pertinent Vitals/Pain Pain Assessment: 0-10 Pain Score: 1  Pain Location: right hip Pain Descriptors / Indicators: Grimacing;Guarding Pain Intervention(s): Limited activity within patient's tolerance;Monitored during session;Repositioned    Home Living Family/patient expects to be discharged to:: Private residence Living Arrangements: Alone Available Help at Discharge: Family;Available 24 hours/day Type of Home: House Home Access: Level entry     Home Layout: One level Home Equipment: Walker - 2 wheels;Shower seat;Cane - single point Additional Comments: sleeps in the recliner because of her back.     Prior Function Level of Independence: Independent with assistive device(s)         Comments: used walker recently for a couple of weeks.      Hand Dominance   Dominant Hand: Right    Extremity/Trunk Assessment   Upper Extremity Assessment Upper Extremity Assessment: Defer to OT evaluation    Lower Extremity Assessment Lower Extremity Assessment: RLE deficits/detail RLE Deficits / Details: right leg with normal post op pain and weakness.  Ankle at least 3/5, knee 3/5, hip 2+/5 per gross functional assessment.     Cervical /  Trunk Assessment Cervical / Trunk Assessment: Other exceptions Cervical / Trunk Exceptions: Pt reports chronic low back pain, no surgery, but sleeps in a recliner chair because of it.   Communication   Communication: No difficulties  Cognition Arousal/Alertness:  Lethargic;Suspect due to medications Behavior During Therapy: [facility] for tasks assessed/performed Overall Cognitive Status: Within Functional Limits for tasks assessed                                 General Comments: Pt having difficulty keeping her eyes open.          Exercises Total Joint Exercises Ankle Circles/Pumps: AROM;Both;20 reps Quad Sets: AROM;Both;10 reps Heel Slides: AAROM;Right;10 reps   Assessment/Plan    PT Assessment Patient needs continued PT services  PT Problem List Decreased strength;Decreased range of motion;Decreased activity tolerance;Decreased balance;Decreased mobility;Decreased knowledge of use of DME;Decreased knowledge of precautions;Pain       PT Treatment Interventions DME instruction;Gait training;Therapeutic activities;Therapeutic exercise;Neuromuscular re-education;Balance training;Stair training;Functional mobility training;Patient/family education;Manual techniques;Modalities    PT Goals (Current goals can be found in the Care Plan section)  Acute Rehab PT Goals Patient Stated Goal: to go home wiht family PT Goal Formulation: With patient Time For Goal Achievement: 12/25/17 Potential to Achieve Goals: Good    Frequency 7X/week    AM-PAC PT "6 Clicks" Daily Activity  Outcome Measure Difficulty turning over in bed (including adjusting bedclothes, sheets and blankets)?: A Little Difficulty moving from lying on back to sitting on the side of the bed? : A Little Difficulty sitting down on and standing up from a chair with arms (e.g., wheelchair, bedside commode, etc,.)?: A Little Help needed moving to and from a bed to chair (including a wheelchair)?: A Little Help needed walking in hospital room?: A Little Help needed climbing 3-5 steps with a railing? : A Little 6 Click Score: 18    End of Session   Activity Tolerance: Patient limited by pain Patient left: in chair;with call bell/phone within reach Nurse Communication:  Mobility status PT Visit Diagnosis: Difficulty in walking, not elsewhere classified (R26.2);Pain;Muscle weakness (generalized) (M62.81) Pain - Right/Left: Right Pain - part of body: Hip    Time: 1829-9371 PT Time Calculation (min) (ACUTE ONLY): 43 min   Charges:          Wells Guiles B. Makeshia Seat, PT, DPT [phone]   PT Evaluation $PT Eval Moderate Complexity: 1 Mod PT Treatments $Gait Training: 8-22 mins $Therapeutic Exercise: 8-22 mins   12/18/2017, 5:37 PM

## 2017-12-18 NOTE — Transfer of Care (Signed)
Immediate Anesthesia Transfer of Care Note  Patient: Rachel Bates  Procedure(s) Performed: RIGHT TOTAL HIP ARTHROPLASTY (Right Hip)  Patient Location: PACU  Anesthesia Type:General  Level of Consciousness: awake, alert  and oriented  Airway & Oxygen Therapy: Patient Spontanous Breathing and Patient connected to nasal cannula oxygen  Post-op Assessment: Report given to RN, Post -op Vital signs reviewed and stable and Patient moving all extremities X 4  Post vital signs: Reviewed and stable  Last Vitals:  Vitals Value Taken Time  BP 119/62 12/18/2017 10:54 AM  Temp 36.5 C 12/18/2017 10:56 AM  Pulse 79 12/18/2017 10:58 AM  Resp 9 12/18/2017 10:58 AM  SpO2 96 % 12/18/2017 10:58 AM  Vitals shown include unvalidated device data.  Last Pain:  Vitals:   12/18/17 1056  TempSrc:   PainSc: 7       Patients Stated Pain Goal: 2 (14/97/02 6378)  Complications: No apparent anesthesia complications

## 2017-12-19 LAB — CBC
HCT: 30.5 % — ABNORMAL LOW (ref 36.0–46.0)
HEMOGLOBIN: 9.7 g/dL — AB (ref 12.0–15.0)
MCH: 26.6 pg (ref 26.0–34.0)
MCHC: 31.8 g/dL (ref 30.0–36.0)
MCV: 83.6 fL (ref 78.0–100.0)
PLATELETS: 103 10*3/uL — AB (ref 150–400)
RBC: 3.65 MIL/uL — AB (ref 3.87–5.11)
RDW: 15.9 % — ABNORMAL HIGH (ref 11.5–15.5)
WBC: 6 10*3/uL (ref 4.0–10.5)

## 2017-12-19 LAB — BASIC METABOLIC PANEL
Anion gap: 9 (ref 5–15)
BUN: 9 mg/dL (ref 6–20)
CHLORIDE: 105 mmol/L (ref 101–111)
CO2: 25 mmol/L (ref 22–32)
CREATININE: 0.67 mg/dL (ref 0.44–1.00)
Calcium: 8.9 mg/dL (ref 8.9–10.3)
GFR calc Af Amer: >60 mL/min (ref >60)
GFR calc non Af Amer: >60 mL/min (ref >60)
GLUCOSE: 122 mg/dL — AB (ref 65–99)
POTASSIUM: 4 mmol/L (ref 3.5–5.1)
Sodium: 139 mmol/L (ref 135–145)

## 2017-12-19 NOTE — Progress Notes (Signed)
Physical Therapy Treatment Patient Details Name: Rachel Bates MRN: [id number] DOB: Jun 29, 1946 Today's Date: 12/19/2017    History of Present Illness 72 y.o. female admitted on 12/18/17 for elective R THA posterior approach.  Pt with significant PMH of chronic low back pain, ventricular tachycardia, PE, AICD, MI, ischemic cardiomyopathy, DVT, CHF, CAD, A-fib, and R shoulder arthroscopy.    PT Comments    Patient is progressing toward PT goals and tolerated ambulating 234ft with RW and supervision for safety. Pain 5/10 with mobility. Continue to progress as tolerated.    Follow Up Recommendations  Home health PT;Follow surgeon's recommendation for DC plan and follow-up therapies;Supervision for mobility/OOB     Equipment Recommendations  3in1 (PT);Other (comment)(per pt is to be delivered here? she already has RW)    Recommendations for Other Services OT consult     Precautions / Restrictions Precautions Precautions: Posterior Hip Precaution Booklet Issued: (handout in room) Precaution Comments: pt able to recall 1/3 precautions and 3/3 precautions reviewed Restrictions Weight Bearing Restrictions: Yes RLE Weight Bearing: Weight bearing as tolerated    Mobility  Bed Mobility Overal bed mobility: Needs Assistance Bed Mobility: Supine to Sit     Supine to sit: Min assist;HOB elevated     General bed mobility comments: assist for initial movement of R LE toward EOB  Transfers Overall transfer level: Needs assistance Equipment used: Rolling walker (2 wheeled) Transfers: Sit to/from Stand Sit to Stand: Supervision         General transfer comment: supervision for safety; safe hand placement demonstrated  Ambulation/Gait Ambulation/Gait assistance: Supervision Ambulation Distance (Feet): 200 Feet Assistive device: Rolling walker (2 wheeled) Gait Pattern/deviations: Step-through pattern;Decreased weight shift to right Gait velocity: decreased    General Gait  Details: heavy reliance on RW; pt with improving step length symmetry with increased distance; cues for increased R hip/knee flexion; pt with slight circumduction on R side but able to correct with vc   Stairs         General stair comments: verbally reviewed sequencing with pt   Wheelchair Mobility    Modified Rankin (Stroke Patients Only)       Balance Overall balance assessment: Needs assistance Sitting-balance support: Feet supported;Bilateral upper extremity supported Sitting balance-Leahy Scale: Good       Standing balance-Leahy Scale: Fair Standing balance comment: pt able to static stand at sink without UE support                            Cognition Arousal/Alertness: Awake/alert Behavior During Therapy: WFL for tasks assessed/performed Overall Cognitive Status: Within Functional Limits for tasks assessed                                        Exercises      General Comments        Pertinent Vitals/Pain Pain Assessment: 0-10 Pain Score: 5  Pain Location: right hip Pain Descriptors / Indicators: Sore Pain Intervention(s): Monitored during session;Premedicated before session;Repositioned    Home Living                      Prior Function            PT Goals (current goals can now be found in the care plan section) Acute Rehab PT Goals PT Goal Formulation: With patient Time For Goal Achievement: 12/25/17  Potential to Achieve Goals: Good Progress towards PT goals: Progressing toward goals    Frequency    7X/week      PT Plan Current plan remains appropriate    Co-evaluation              AM-PAC PT "6 Clicks" Daily Activity  Outcome Measure  Difficulty turning over in bed (including adjusting bedclothes, sheets and blankets)?: A Little Difficulty moving from lying on back to sitting on the side of the bed? : A Little Difficulty sitting down on and standing up from a chair with arms (e.g.,  wheelchair, bedside commode, etc,.)?: A Little Help needed moving to and from a bed to chair (including a wheelchair)?: A Little Help needed walking in hospital room?: A Little Help needed climbing 3-5 steps with a railing? : A Little 6 Click Score: 18    End of Session Equipment Utilized During Treatment: Gait belt Activity Tolerance: Patient tolerated treatment well Patient left: in chair;with call bell/phone within reach Nurse Communication: Mobility status PT Visit Diagnosis: Difficulty in walking, not elsewhere classified (R26.2);Pain;Muscle weakness (generalized) (M62.81) Pain - Right/Left: Right Pain - part of body: Hip     Time: 5176-1607 PT Time Calculation (min) (ACUTE ONLY): 24 min  Charges:  $Gait Training: 23-37 mins                    G Codes:       Earney Navy, PTA Pager: [phone]     Darliss Cheney 12/19/2017, 9:12 AM

## 2017-12-19 NOTE — Progress Notes (Signed)
Physical Therapy Treatment Patient Details Name: Rachel Bates MRN: [id number] DOB: [birth year] Today's Date: 12/19/2017    History of Present Illness 72 y.o. female admitted on 12/18/17 for elective R THA posterior approach.  Pt with significant PMH of chronic low back pain, ventricular tachycardia, PE, AICD, MI, ischemic cardiomyopathy, DVT, CHF, CAD, A-fib, and R shoulder arthroscopy.    PT Comments    Patient continues to make progress with PT goals and tolerated gait and therex well. Current plan remains appropriate.    Follow Up Recommendations  Home health PT;Follow surgeon's recommendation for DC plan and follow-up therapies;Supervision for mobility/OOB     Equipment Recommendations  3in1 (PT);Other (comment)(per pt is to be delivered here? she already has RW)    Recommendations for Other Services OT consult     Precautions / Restrictions Precautions Precautions: Posterior Hip Precaution Booklet Issued: (handout in room) Precaution Comments: pt able to recall 1/3 precautions and 3/3 precautions reviewed Restrictions Weight Bearing Restrictions: Yes RLE Weight Bearing: Weight bearing as tolerated    Mobility  Bed Mobility Overal bed mobility: Needs Assistance Bed Mobility: Sit to Supine       Sit to supine: Min assist   General bed mobility comments: assist needed to bring R LE into bed  Transfers Overall transfer level: Needs assistance Equipment used: Rolling walker (2 wheeled) Transfers: Sit to/from Stand Sit to Stand: Supervision         General transfer comment: supervision for safety; safe hand placement demonstrated  Ambulation/Gait Ambulation/Gait assistance: Supervision Ambulation Distance (Feet): 200 Feet Assistive device: Rolling walker (2 wheeled) Gait Pattern/deviations: Step-through pattern;Decreased weight shift to right Gait velocity: decreased    General Gait Details: cues for posture and cadence   Stairs         General  stair comments: verbally reviewed sequencing with pt   Wheelchair Mobility    Modified Rankin (Stroke Patients Only)       Balance Overall balance assessment: Needs assistance Sitting-balance support: Feet supported;Bilateral upper extremity supported Sitting balance-Leahy Scale: Good       Standing balance-Leahy Scale: Fair Standing balance comment: pt able to static stand at sink without UE support                            Cognition Arousal/Alertness: Awake/alert Behavior During Therapy: WFL for tasks assessed/performed Overall Cognitive Status: Within Functional Limits for tasks assessed                                        Exercises Total Joint Exercises Short Arc Quad: AROM;Right;10 reps Hip ABduction/ADduction: AROM;Right;10 reps Knee Flexion: AROM;Right;10 reps;Standing Marching in Standing: AROM;Right;10 reps;Standing Standing Hip Extension: AROM;Right;10 reps;Standing    General Comments        Pertinent Vitals/Pain Pain Assessment: Faces Faces Pain Scale: Hurts little more Pain Location: right hip Pain Descriptors / Indicators: Sore Pain Intervention(s): Limited activity within patient's tolerance;Monitored during session;Premedicated before session;Repositioned    Home Living                      Prior Function            PT Goals (current goals can now be found in the care plan section) Acute Rehab PT Goals PT Goal Formulation: With patient Time For Goal Achievement: 12/25/17 Potential to Achieve Goals: Good Progress  towards PT goals: Progressing toward goals    Frequency    7X/week      PT Plan Current plan remains appropriate    Co-evaluation              AM-PAC PT "6 Clicks" Daily Activity  Outcome Measure  Difficulty turning over in bed (including adjusting bedclothes, sheets and blankets)?: A Little Difficulty moving from lying on back to sitting on the side of the bed? : A  Little Difficulty sitting down on and standing up from a chair with arms (e.g., wheelchair, bedside commode, etc,.)?: A Little Help needed moving to and from a bed to chair (including a wheelchair)?: A Little Help needed walking in hospital room?: A Little Help needed climbing 3-5 steps with a railing? : A Little 6 Click Score: 18    End of Session Equipment Utilized During Treatment: Gait belt Activity Tolerance: Patient tolerated treatment well Patient left: with call bell/phone within reach;in bed;with family/visitor present Nurse Communication: Mobility status PT Visit Diagnosis: Difficulty in walking, not elsewhere classified (R26.2);Pain;Muscle weakness (generalized) (M62.81) Pain - Right/Left: Right Pain - part of body: Hip     Time: 1330-1350 PT Time Calculation (min) (ACUTE ONLY): 20 min  Charges:  $Therapeutic Exercise: 8-22 mins                    G Codes:       Earney Navy, PTA Pager: [phone]     Darliss Cheney 12/19/2017, 3:39 PM

## 2017-12-19 NOTE — Care Management Note (Signed)
Case Management Note  Patient Details  Name: Rachel Bates MRN: [id number] Date of Birth: 04-Jun-1946  Subjective/Objective:   72 yr old female s/p right total hip arthroplasty.                 Action/Plan: Patient was preoperatively setup with [facility], no changes. She will have family support at discharge.    Expected Discharge Date:    12/20/17              Expected Discharge Plan:  [place]  In-House Referral:  NA  Discharge planning Services  CM Consult  Post Acute Care Choice:  Durable Medical Equipment, Home Health Choice offered to:  Patient  DME Arranged:  3-N-1, Walker rolling DME Agency:  [employer]  HH Arranged:  PT [place]:  [facility] [employer]  Status of Service:  Completed, signed off  If discussed at [employer], dates discussed:    Additional Comments:  [name], [name], RN 12/19/2017, 2:04 PM

## 2017-12-19 NOTE — Progress Notes (Addendum)
Patient ID: Rachel Bates, female   DOB: 24-May-1946, 72 y.o.   MRN: [id number]     Subjective:  Patient reports pain as mild to moderate.  Patient in bed and no acute distress.    Objective:   VITALS:   Vitals:   12/18/17 1739 12/18/17 1951 12/19/17 0002 12/19/17 0440  BP:  105/65 (!) 118/54 (!) 116/52  Pulse:  89 77 83  Resp:  16    Temp:  98.2 F (36.8 C) 97.8 F (36.6 C) 98 F (36.7 C)  TempSrc:  Oral Oral Oral  SpO2: 94% 98% 93% (!) 76%  Weight:      Height:        ABD soft Sensation intact distally Dorsiflexion/Plantar flexion intact Incision: dressing C/D/I and no drainage   Lab Results  Component Value Date   WBC 4.4 12/07/2017   HGB 12.7 12/07/2017   HCT 39.8 12/07/2017   MCV 82.4 12/07/2017   PLT 138 (L) 12/07/2017   BMET    Component Value Date/Time   NA 139 12/07/2017 1015   NA 143 12/11/2016 1215   K 4.2 12/07/2017 1015   CL 107 12/07/2017 1015   CO2 26 12/07/2017 1015   GLUCOSE 94 12/07/2017 1015   BUN 10 12/07/2017 1015   BUN 11 12/11/2016 1215   CREATININE 0.76 12/07/2017 1015   CALCIUM 9.1 12/07/2017 1015   GFRNONAA >60 12/07/2017 1015   GFRAA >60 12/07/2017 1015     Assessment/Plan: 1 Day Post-Op   Principal Problem:   Primary localized osteoarthritis of right hip Active Problems:   Primary localized osteoarthritis of hip   Advance diet Up with therapy Plan for discharge tomorrow WBAT Dry dressing PRN       Remonia Richter 12/19/2017, 7:36 AM  Seen and agree with above.   Marchia Bond, MD Cell [phone]

## 2017-12-19 NOTE — Plan of Care (Signed)
  Problem: Pain Managment: Goal: General experience of comfort will improve Outcome: Progressing   Problem: Safety: Goal: Ability to remain free from injury will improve Outcome: Progressing   Problem: Health Behavior/Discharge Planning: Goal: Ability to manage health-related needs will improve Outcome: Progressing   

## 2017-12-19 NOTE — Evaluation (Signed)
Occupational Therapy Evaluation and Discharge Patient Details Name: Rachel Bates MRN: [id number] DOB: May 05, 1946 Today's Date: 12/19/2017    History of Present Illness 72 y.o. female admitted on 12/18/17 for elective R THA posterior approach.  Pt with significant PMH of chronic low back pain, ventricular tachycardia, PE, AICD, MI, ischemic cardiomyopathy, DVT, CHF, CAD, A-fib, and R shoulder arthroscopy.   Clinical Impression   PTA, pt was independent with RW for basic ADL and functional mobility. She was limiting her IADL participation due to pain. She is currently limited by R LE pain but very motivated to participate in ADL. Educated pt concerning posterior hip precautions related to ADL as well as compensatory strategies to maximize safety and adherence to posterior hip precautions. Pt was able to complete LB dressing tasks with supervision for safety using reacher today. She is able to complete walk-in shower and ambulating toilet transfers with supervision this session. Pt requiring mod assist for LB bathing tasks without AE but has equipment at home to assist with this. Pt is able to complete standing grooming tasks with overall supervision. Pt demonstrating good understanding of precautions and reports no further questions or concerns. She will have 24 hour assistance from her daughter post-acute D/C. Pt demonstrates understanding of all education topics and no further acute OT needs identified. OT will sign off.     Follow Up Recommendations  No OT follow up;Supervision/Assistance - 24 hour    Equipment Recommendations  3 in 1 bedside commode;Other (comment)(reacher; long handled sponge)    Recommendations for Other Services       Precautions / Restrictions Precautions Precautions: Posterior Hip Precaution Booklet Issued: Yes (comment) Precaution Comments: Pt able to state 3/3 precautions at the beginning of session. Cued for decreasing bending throughout session.   Restrictions Weight Bearing Restrictions: Yes RLE Weight Bearing: Weight bearing as tolerated      Mobility Bed Mobility               General bed mobility comments: OOB in recliner on my arrival.   Transfers Overall transfer level: Needs assistance Equipment used: Rolling walker (2 wheeled) Transfers: Sit to/from Stand Sit to Stand: Supervision         General transfer comment: Supervision for safety. Cues to place R LE out in front in order to avoid bending past 90 degrees.     Balance Overall balance assessment: Needs assistance Sitting-balance support: Feet supported;Bilateral upper extremity supported Sitting balance-Leahy Scale: Good     Standing balance support: Bilateral upper extremity supported;No upper extremity supported;During functional activity Standing balance-Leahy Scale: Fair Standing balance comment: Able to statically stand at sink for oral care without UE support.                            ADL either performed or assessed with clinical judgement   ADL Overall ADL's : Needs assistance/impaired Eating/Feeding: Set up;Sitting   Grooming: Supervision/safety;Standing   Upper Body Bathing: Sitting;Set up   Lower Body Bathing: Moderate assistance;Sit to/from stand Lower Body Bathing Details (indicate cue type and reason): without AE for LB bathing; discussed use of long handled sponge but pt reports that she uses extra long washcloth that will reacher her feet without bending over (she already uses this with her back) Upper Body Dressing : Set up;Sitting   Lower Body Dressing: Supervision/safety;With adaptive equipment;Sit to/from stand   Toilet Transfer: Supervision/safety;Ambulation;RW Toilet Transfer Details (indicate cue type and reason): cues to maintain posterior  hip precautions Toileting- Clothing Manipulation and Hygiene: Supervision/safety;Sit to/from stand   Tub/ Shower Transfer: Supervision/safety;Ambulation;Shower  seat;Walk-in Academic librarian Details (indicate cue type and reason): Educated concerning safe methodology.  Functional mobility during ADLs: Supervision/safety;Rolling walker General ADL Comments: Educated pt concerning posterior hip precautions for ADL and ADL transfers. She was able to adhere to these with cues. Educated concerning use of AE for LB ADL. She reports that she will not be wearing socks during the summer. She has already purchased long handled shoe horn but will look into reacher.      Vision Patient Visual Report: No change from baseline Vision Assessment?: No apparent visual deficits     Perception     Praxis      Pertinent Vitals/Pain Pain Assessment: 0-10 Pain Score: 5  Pain Descriptors / Indicators: Sore Pain Intervention(s): Monitored during session;Limited activity within patient's tolerance;Repositioned     Hand Dominance Right   Extremity/Trunk Assessment Upper Extremity Assessment Upper Extremity Assessment: Overall WFL for tasks assessed   Lower Extremity Assessment Lower Extremity Assessment: RLE deficits/detail RLE Deficits / Details: Post-operative pain and weakness.    Cervical / Trunk Assessment Cervical / Trunk Assessment: Other exceptions Cervical / Trunk Exceptions: Chronic low back pain. Sleeps in recliner due to this.    Communication Communication Communication: No difficulties   Cognition Arousal/Alertness: Awake/alert Behavior During Therapy: WFL for tasks assessed/performed Overall Cognitive Status: Within Functional Limits for tasks assessed                                     General Comments  Pt looking up reachers on [place] at end of session. She demonstrates understanding of all precautions and use of AE.     Exercises     Shoulder Instructions      Home Living Family/patient expects to be discharged to:: Private residence Living Arrangements: Alone Available Help at Discharge:  Family;Available 24 hours/day(daughter initially) Type of Home: House Home Access: Level entry     Home Layout: One level     Bathroom Shower/Tub: Tub/shower unit;Walk-in shower(will use walk-in shower)   Bathroom Toilet: Standard     Home Equipment: Walker - 2 wheels;Shower seat;Cane - single point;Adaptive equipment Adaptive Equipment: Long-handled shoe horn Additional Comments: sleeps in the recliner because of her back.       Prior Functioning/Environment Level of Independence: Independent with assistive device(s)        Comments: Recently was using RW.         OT Problem List: Decreased knowledge of precautions;Decreased knowledge of use of DME or AE;Decreased activity tolerance      OT Treatment/Interventions:      OT Goals(Current goals can be found in the care plan section) Acute Rehab OT Goals Patient Stated Goal: to go home with family OT Goal Formulation: With patient Time For Goal Achievement: 01/02/18 Potential to Achieve Goals: Good  OT Frequency:     Barriers to D/C:            Co-evaluation              AM-PAC PT "6 Clicks" Daily Activity     Outcome Measure Help from another person eating meals?: None Help from another person taking care of personal grooming?: A Little Help from another person toileting, which includes using toliet, bedpan, or urinal?: A Little Help from another person bathing (including washing, rinsing, drying)?: A Little Help  from another person to put on and taking off regular upper body clothing?: A Little Help from another person to put on and taking off regular lower body clothing?: A Little 6 Click Score: 19   End of Session Equipment Utilized During Treatment: Clinical biochemist)  Activity Tolerance: Patient tolerated treatment well Patient left: in chair;with call bell/phone within reach  OT Visit Diagnosis: Other abnormalities of gait and mobility (R26.89);Pain Pain - Right/Left: Right Pain - part of  body: Hip                Time: 1115-1200 OT Time Calculation (min): 45 min Charges:  OT General Charges $OT Visit: 1 Visit OT Evaluation $OT Eval Moderate Complexity: 1 Mod OT Treatments $Self Care/Home Management : 23-37 mins G-Codes:     Norman Herrlich, MS OTR/L  Pager: [place] A Sheyla Zaffino 12/19/2017, 1:07 PM

## 2017-12-20 ENCOUNTER — Encounter (HOSPITAL_COMMUNITY): Payer: Self-pay | Admitting: Orthopedic Surgery

## 2017-12-20 LAB — CBC
HEMATOCRIT: 28.3 % — AB (ref 36.0–46.0)
Hemoglobin: 9 g/dL — ABNORMAL LOW (ref 12.0–15.0)
MCH: 26.9 pg (ref 26.0–34.0)
MCHC: 31.8 g/dL (ref 30.0–36.0)
MCV: 84.7 fL (ref 78.0–100.0)
Platelets: 99 10*3/uL — ABNORMAL LOW (ref 150–400)
RBC: 3.34 MIL/uL — ABNORMAL LOW (ref 3.87–5.11)
RDW: 16.3 % — AB (ref 11.5–15.5)
WBC: 7 10*3/uL (ref 4.0–10.5)

## 2017-12-20 LAB — BASIC METABOLIC PANEL
ANION GAP: 8 (ref 5–15)
BUN: 7 mg/dL (ref 6–20)
CALCIUM: 8.6 mg/dL — AB (ref 8.9–10.3)
CO2: 28 mmol/L (ref 22–32)
CREATININE: 0.66 mg/dL (ref 0.44–1.00)
Chloride: 105 mmol/L (ref 101–111)
Glucose, Bld: 118 mg/dL — ABNORMAL HIGH (ref 65–99)
Potassium: 3.8 mmol/L (ref 3.5–5.1)
SODIUM: 141 mmol/L (ref 135–145)

## 2017-12-20 NOTE — Progress Notes (Signed)
Physical Therapy Treatment Patient Details Name: Rachel Bates MRN: [id number] DOB: 07-30-1945 Today's Date: 12/20/2017    History of Present Illness 72 y.o. female admitted on 12/18/17 for elective R THA posterior approach.  Pt with significant PMH of chronic low back pain, ventricular tachycardia, PE, AICD, MI, ischemic cardiomyopathy, DVT, CHF, CAD, A-fib, and R shoulder arthroscopy.    PT Comments    Pt performed increased activity during gait training and reviewed standing exercises.  Plan for d/c this pm with support from daughter.  Pt educated on frequency of exercises and continuation of walking at home.     Follow Up Recommendations  Home health PT;Follow surgeon's recommendation for DC plan and follow-up therapies;Supervision for mobility/OOB     Equipment Recommendations  3in1 (PT)    Recommendations for Other Services OT consult     Precautions / Restrictions Precautions Precautions: Posterior Hip Precaution Comments: Pt able to recall 2/3 hip precautions.  Restrictions Weight Bearing Restrictions: Yes RLE Weight Bearing: Weight bearing as tolerated    Mobility  Bed Mobility Overal bed mobility: Needs Assistance Bed Mobility: Supine to Sit     Supine to sit: Modified independent (Device/Increase time)     General bed mobility comments: Pt able to self assist R LE into and out of the bed unassisted.    Transfers Overall transfer level: Needs assistance Equipment used: Rolling walker (2 wheeled) Transfers: Sit to/from Stand Sit to Stand: Modified independent (Device/Increase time)         General transfer comment: Mod I from elevated surface.  Ambulation/Gait Ambulation/Gait assistance: Modified independent (Device/Increase time) Gait Distance (Feet): 200 Feet Assistive device: Rolling walker (2 wheeled) Gait Pattern/deviations: Step-through pattern;Decreased weight shift to right Gait velocity: decreased    General Gait Details: Cues for  continuous movement of RW to improve continuity of gait.     Stairs         General stair comments: verbally reviewed sequencing with pt, pt did not wish to attempt.     Wheelchair Mobility    Modified Rankin (Stroke Patients Only)       Balance Overall balance assessment: Needs assistance Sitting-balance support: Feet supported;Bilateral upper extremity supported Sitting balance-Leahy Scale: Good       Standing balance-Leahy Scale: Fair Standing balance comment: pt able to static stand at sink without UE support                            Cognition Arousal/Alertness: Awake/alert Behavior During Therapy: WFL for tasks assessed/performed Overall Cognitive Status: Within Functional Limits for tasks assessed                                 General Comments: Pt having difficulty keeping her eyes open.       Exercises Total Joint Exercises Hip ABduction/ADduction: AROM;Right;10 reps;Standing Knee Flexion: AROM;Right;10 reps;Standing Marching in Standing: AROM;Right;10 reps;Standing Standing Hip Extension: AROM;Right;10 reps;Standing    General Comments        Pertinent Vitals/Pain Pain Assessment: 0-10 Pain Score: 5  Pain Location: right hip Pain Descriptors / Indicators: Sore Pain Intervention(s): Monitored during session;Repositioned;Ice applied    Home Living                      Prior Function            PT Goals (current goals can now be found in  the care plan section) Acute Rehab PT Goals Patient Stated Goal: to go home with family Potential to Achieve Goals: Good Progress towards PT goals: Progressing toward goals    Frequency    7X/week      PT Plan Current plan remains appropriate    Co-evaluation              AM-PAC PT "6 Clicks" Daily Activity  Outcome Measure  Difficulty turning over in bed (including adjusting bedclothes, sheets and blankets)?: None Difficulty moving from lying on back  to sitting on the side of the bed? : None Difficulty sitting down on and standing up from a chair with arms (e.g., wheelchair, bedside commode, etc,.)?: None Help needed moving to and from a bed to chair (including a wheelchair)?: None Help needed walking in hospital room?: None Help needed climbing 3-5 steps with a railing? : A Little 6 Click Score: 23    End of Session Equipment Utilized During Treatment: Gait belt Activity Tolerance: Patient tolerated treatment well Patient left: with call bell/phone within reach;in bed;with family/visitor present Nurse Communication: Mobility status PT Visit Diagnosis: Difficulty in walking, not elsewhere classified (R26.2);Pain;Muscle weakness (generalized) (M62.81) Pain - Right/Left: Right Pain - part of body: Hip     Time: 1011-1029 PT Time Calculation (min) (ACUTE ONLY): 18 min  Charges:  $Gait Training: 8-22 mins                    G Codes:       Governor Rooks, PTA pager [phone]    Cristela Blue 12/20/2017, 10:38 AM

## 2017-12-20 NOTE — Discharge Summary (Signed)
Physician Discharge Summary  Patient ID: PARRIS SIGNER MRN: [id number] DOB/AGE: 73-Mar-1947 73 y.o.  Admit date: 12/18/2017 Discharge date: 12/20/2017  Admission Diagnoses:  Primary localized osteoarthritis of right hip  Discharge Diagnoses:  Principal Problem:   Primary localized osteoarthritis of right hip Active Problems:   Primary localized osteoarthritis of hip   Past Medical History:  Diagnosis Date  . AICD (automatic cardioverter/defibrillator) present    Medtronic   . Atrial fibrillation ([place])   . CAD (coronary artery disease)    MI and 2 stents 1995 in [place]  . CHF (congestive heart failure) (HCC)    mild  . Depression   . 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  Marland Kitchen Dyslipidemia   . Headache    migraines  . Ischemic cardiomyopathy   . 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 Presence of permanent cardiac pacemaker   . Primary localized osteoarthritis of right hip 12/18/2017  . Pulmonary embolism ([place]) 2012  . Sleep apnea    no CPAP  . Ventricular tachycardia ([place])     Surgeries: Procedure(s): RIGHT TOTAL HIP ARTHROPLASTY on 12/18/2017   Consultants (if any):   Discharged Condition: Improved  Hospital Course: TASHIYA SOUDERS is an 72 y.o. female who was admitted 12/18/2017 with a diagnosis of Primary localized osteoarthritis of right hip and went to the operating room on 12/18/2017 and underwent the above named procedures.    She was given perioperative antibiotics:  Anti-infectives (From admission, onward)   Start     Dose/Rate Route Frequency Ordered Stop   12/18/17 1630  ceFAZolin (ANCEF) IVPB 2g/100 mL premix     2 g 200 mL/hr over 30 Minutes Intravenous Every 6 hours 12/18/17 1541 12/18/17 2141   12/18/17 0700  ceFAZolin (ANCEF) IVPB 2g/100 mL premix     2 g 200 mL/hr over 30 Minutes Intravenous On call to O.R. 12/18/17 2119 12/18/17 0847    .  She was given sequential compression devices,  early ambulation, and eliquis for DVT prophylaxis.  She benefited maximally from the hospital stay and there were no complications.    Recent vital signs:  Vitals:   12/19/17 2004 12/20/17 0358  BP: (!) 140/57 (!) 143/56  Pulse: 97 86  Resp:    Temp: 98.5 F (36.9 C) 98.4 F (36.9 C)  SpO2: 98% 96%    Recent laboratory studies:  Lab Results  Component Value Date   HGB 9.0 (L) 12/20/2017   HGB 9.7 (L) 12/19/2017   HGB 12.7 12/07/2017   Lab Results  Component Value Date   WBC 7.0 12/20/2017   PLT 99 (L) 12/20/2017   Lab Results  Component Value Date   INR 1.01 12/18/2017   Lab Results  Component Value Date   NA 141 12/20/2017   K 3.8 12/20/2017   CL 105 12/20/2017   CO2 28 12/20/2017   BUN 7 12/20/2017   CREATININE 0.66 12/20/2017   GLUCOSE 118 (H) 12/20/2017    Discharge Medications:   Allergies as of 12/20/2017      Reactions   Sumatriptan Succinate Other (See Comments)   Chest pain, no triptans, pt states "it makes my heart race"   Amitriptyline Other (See Comments)   Weight gain      Medication List    STOP taking these medications   enoxaparin 80 MG/0.8ML injection Commonly known as:  LOVENOX     TAKE  these medications   apixaban 5 MG Tabs tablet Commonly known as:  ELIQUIS Take 1 tablet (5 mg total) by mouth 2 (two) times daily.   baclofen 10 MG tablet Commonly known as:  LIORESAL Take 1 tablet (10 mg total) by mouth daily as needed (MIGRAINES). Or for muscle spasm What changed:  additional instructions   Calcium-D 600-400 MG-UNIT Tabs Take 1 tablet by mouth 2 (two) times daily.   carvedilol 12.5 MG tablet Commonly known as:  COREG Take 12.5 mg by mouth 2 (two) times daily with a meal.   diphenhydrAMINE 25 mg capsule Commonly known as:  BENADRYL Take 25 mg by mouth daily.   FIBER PO Take 1 capsule by mouth daily.   Fish Oil 1000 MG Caps Take 1 capsule by mouth daily.   HYDROcodone-acetaminophen 10-325 MG tablet Commonly known  as:  NORCO Take 1 tablet by mouth every 6 (six) hours as needed.   ramipril 2.5 MG tablet Commonly known as:  ALTACE Take 2.5 mg by mouth daily.   sennosides-docusate sodium 8.6-50 MG tablet Commonly known as:  SENOKOT-S Take 2 tablets by mouth daily.   sertraline 100 MG tablet Commonly known as:  ZOLOFT Take 100 mg by mouth at bedtime.   simvastatin 40 MG tablet Commonly known as:  ZOCOR Take 40 mg by mouth at bedtime.   STRESS FORMULA Tabs Take 1 tablet by mouth daily.   Vitamin D-3 1000 units Caps Take 1 capsule by mouth daily.   vitamin E 400 UNIT capsule Take 400 Units by mouth daily.   zonisamide 50 MG capsule Commonly known as:  ZONEGRAN Take 100 mg by mouth daily.       Diagnostic Studies: Dg Hip Port Unilat With Pelvis 1v Right  Result Date: 12/18/2017 CLINICAL DATA:  Status post right hip replacement today. EXAM: DG HIP (WITH OR WITHOUT PELVIS) 1V PORT RIGHT COMPARISON:  None. FINDINGS: Right total hip arthroplasty is in place. The device is located. No fracture. Left hip osteoarthritis noted. IMPRESSION: Status post right total hip arthroplasty.  No acute finding. Left hip osteoarthritis. Electronically Signed   By: Inge Rise M.D.   On: 12/18/2017 11:42    Disposition:     Follow-up Information    Marchia Bond, MD. Schedule an appointment as soon as possible for a visit in 2 weeks.   Specialty:  Orthopedic Surgery Contact information: [address] [place] [address] [phone]        [facility].   Specialty:  [place] Why:  A representative from [facility] will contact you to arrange start date and time for your therapy.   Contact information: [address] [place] [address] [phone]            Signed: Johnny Bridge 12/20/2017, 7:52 AM

## 2017-12-20 NOTE — Progress Notes (Signed)
RN gave patient discharge instructions, PT is being discharged home and all prescriptions had been escribed over to [facility] in [place]. Pt comfortable and finsihing lunch before discharge pt friend at bedside with pt belongings.

## 2017-12-22 DIAGNOSIS — I252 Old myocardial infarction: Secondary | ICD-10-CM | POA: Diagnosis not present

## 2017-12-22 DIAGNOSIS — I2510 Atherosclerotic heart disease of native coronary artery without angina pectoris: Secondary | ICD-10-CM | POA: Diagnosis not present

## 2017-12-22 DIAGNOSIS — I480 Paroxysmal atrial fibrillation: Secondary | ICD-10-CM | POA: Diagnosis not present

## 2017-12-22 DIAGNOSIS — I2589 Other forms of chronic ischemic heart disease: Secondary | ICD-10-CM | POA: Diagnosis not present

## 2017-12-22 DIAGNOSIS — Z471 Aftercare following joint replacement surgery: Secondary | ICD-10-CM | POA: Diagnosis not present

## 2017-12-22 DIAGNOSIS — I5022 Chronic systolic (congestive) heart failure: Secondary | ICD-10-CM | POA: Diagnosis not present

## 2017-12-25 DIAGNOSIS — I480 Paroxysmal atrial fibrillation: Secondary | ICD-10-CM | POA: Diagnosis not present

## 2017-12-25 DIAGNOSIS — I252 Old myocardial infarction: Secondary | ICD-10-CM | POA: Diagnosis not present

## 2017-12-25 DIAGNOSIS — Z471 Aftercare following joint replacement surgery: Secondary | ICD-10-CM | POA: Diagnosis not present

## 2017-12-25 DIAGNOSIS — I5022 Chronic systolic (congestive) heart failure: Secondary | ICD-10-CM | POA: Diagnosis not present

## 2017-12-25 DIAGNOSIS — I2589 Other forms of chronic ischemic heart disease: Secondary | ICD-10-CM | POA: Diagnosis not present

## 2017-12-25 DIAGNOSIS — I2510 Atherosclerotic heart disease of native coronary artery without angina pectoris: Secondary | ICD-10-CM | POA: Diagnosis not present

## 2017-12-26 DIAGNOSIS — I480 Paroxysmal atrial fibrillation: Secondary | ICD-10-CM | POA: Diagnosis not present

## 2017-12-26 DIAGNOSIS — I252 Old myocardial infarction: Secondary | ICD-10-CM | POA: Diagnosis not present

## 2017-12-26 DIAGNOSIS — I2589 Other forms of chronic ischemic heart disease: Secondary | ICD-10-CM | POA: Diagnosis not present

## 2017-12-26 DIAGNOSIS — I5022 Chronic systolic (congestive) heart failure: Secondary | ICD-10-CM | POA: Diagnosis not present

## 2017-12-26 DIAGNOSIS — Z471 Aftercare following joint replacement surgery: Secondary | ICD-10-CM | POA: Diagnosis not present

## 2017-12-26 DIAGNOSIS — I2510 Atherosclerotic heart disease of native coronary artery without angina pectoris: Secondary | ICD-10-CM | POA: Diagnosis not present

## 2017-12-28 ENCOUNTER — Other Ambulatory Visit: Payer: Self-pay | Admitting: Internal Medicine

## 2017-12-28 DIAGNOSIS — Z471 Aftercare following joint replacement surgery: Secondary | ICD-10-CM | POA: Diagnosis not present

## 2017-12-28 DIAGNOSIS — I2589 Other forms of chronic ischemic heart disease: Secondary | ICD-10-CM | POA: Diagnosis not present

## 2017-12-28 DIAGNOSIS — I252 Old myocardial infarction: Secondary | ICD-10-CM | POA: Diagnosis not present

## 2017-12-28 DIAGNOSIS — I2510 Atherosclerotic heart disease of native coronary artery without angina pectoris: Secondary | ICD-10-CM | POA: Diagnosis not present

## 2017-12-28 DIAGNOSIS — I5022 Chronic systolic (congestive) heart failure: Secondary | ICD-10-CM | POA: Diagnosis not present

## 2017-12-28 DIAGNOSIS — I480 Paroxysmal atrial fibrillation: Secondary | ICD-10-CM | POA: Diagnosis not present

## 2017-12-28 NOTE — Telephone Encounter (Signed)
Pt pharmacy is requesting a refill on this med please address. Thank you.

## 2017-12-28 NOTE — Telephone Encounter (Signed)
Eliquis 5mg  refill request received; pt is 72 yrs old wt-80.3kg, Crea-0.66 on 12/20/17, last seen by Daune Perch on 10/01/17; will send in refill to requested pharmacy.

## 2018-01-02 DIAGNOSIS — M1611 Unilateral primary osteoarthritis, right hip: Secondary | ICD-10-CM | POA: Diagnosis not present

## 2018-01-03 DIAGNOSIS — Z471 Aftercare following joint replacement surgery: Secondary | ICD-10-CM | POA: Diagnosis not present

## 2018-01-03 DIAGNOSIS — Z96641 Presence of right artificial hip joint: Secondary | ICD-10-CM | POA: Diagnosis not present

## 2018-01-03 DIAGNOSIS — R2689 Other abnormalities of gait and mobility: Secondary | ICD-10-CM | POA: Diagnosis not present

## 2018-01-03 DIAGNOSIS — M6281 Muscle weakness (generalized): Secondary | ICD-10-CM | POA: Diagnosis not present

## 2018-01-07 DIAGNOSIS — R2689 Other abnormalities of gait and mobility: Secondary | ICD-10-CM | POA: Diagnosis not present

## 2018-01-07 DIAGNOSIS — Z96641 Presence of right artificial hip joint: Secondary | ICD-10-CM | POA: Diagnosis not present

## 2018-01-07 DIAGNOSIS — M6281 Muscle weakness (generalized): Secondary | ICD-10-CM | POA: Diagnosis not present

## 2018-01-07 DIAGNOSIS — Z471 Aftercare following joint replacement surgery: Secondary | ICD-10-CM | POA: Diagnosis not present

## 2018-01-09 DIAGNOSIS — Z96641 Presence of right artificial hip joint: Secondary | ICD-10-CM | POA: Diagnosis not present

## 2018-01-09 DIAGNOSIS — M6281 Muscle weakness (generalized): Secondary | ICD-10-CM | POA: Diagnosis not present

## 2018-01-09 DIAGNOSIS — Z471 Aftercare following joint replacement surgery: Secondary | ICD-10-CM | POA: Diagnosis not present

## 2018-01-09 DIAGNOSIS — R2689 Other abnormalities of gait and mobility: Secondary | ICD-10-CM | POA: Diagnosis not present

## 2018-01-14 DIAGNOSIS — Z471 Aftercare following joint replacement surgery: Secondary | ICD-10-CM | POA: Diagnosis not present

## 2018-01-14 DIAGNOSIS — Z96641 Presence of right artificial hip joint: Secondary | ICD-10-CM | POA: Diagnosis not present

## 2018-01-14 DIAGNOSIS — R2689 Other abnormalities of gait and mobility: Secondary | ICD-10-CM | POA: Diagnosis not present

## 2018-01-14 DIAGNOSIS — M6281 Muscle weakness (generalized): Secondary | ICD-10-CM | POA: Diagnosis not present

## 2018-01-16 DIAGNOSIS — M6281 Muscle weakness (generalized): Secondary | ICD-10-CM | POA: Diagnosis not present

## 2018-01-16 DIAGNOSIS — Z96641 Presence of right artificial hip joint: Secondary | ICD-10-CM | POA: Diagnosis not present

## 2018-01-16 DIAGNOSIS — R2689 Other abnormalities of gait and mobility: Secondary | ICD-10-CM | POA: Diagnosis not present

## 2018-01-16 DIAGNOSIS — Z471 Aftercare following joint replacement surgery: Secondary | ICD-10-CM | POA: Diagnosis not present

## 2018-01-17 LAB — CUP PACEART REMOTE DEVICE CHECK
Battery Remaining Longevity: 64 mo
Battery Voltage: 2.98 V
Brady Statistic RA Percent Paced: 7.14 %
Brady Statistic RV Percent Paced: 0.04 %
Date Time Interrogation Session: 20190604125825
HIGH POWER IMPEDANCE MEASURED VALUE: 61 Ohm
HighPow Impedance: 72 Ohm
Implantable Lead Implant Date: 20071003
Implantable Lead Implant Date: 20071019
Implantable Lead Location: 753860
Implantable Lead Model: 6947
Implantable Pulse Generator Implant Date: 20140729
Lead Channel Impedance Value: 342 Ohm
Lead Channel Pacing Threshold Amplitude: 0.5 V
Lead Channel Pacing Threshold Pulse Width: 0.4 ms
Lead Channel Sensing Intrinsic Amplitude: 8.125 mV
Lead Channel Setting Pacing Amplitude: 2 V
Lead Channel Setting Pacing Amplitude: 2.5 V
Lead Channel Setting Sensing Sensitivity: 0.3 mV
MDC IDC LEAD LOCATION: 753859
MDC IDC MSMT LEADCHNL RA IMPEDANCE VALUE: 475 Ohm
MDC IDC MSMT LEADCHNL RA SENSING INTR AMPL: 3.125 mV
MDC IDC MSMT LEADCHNL RA SENSING INTR AMPL: 3.125 mV
MDC IDC MSMT LEADCHNL RV IMPEDANCE VALUE: 418 Ohm
MDC IDC MSMT LEADCHNL RV PACING THRESHOLD AMPLITUDE: 0.75 V
MDC IDC MSMT LEADCHNL RV PACING THRESHOLD PULSEWIDTH: 0.4 ms
MDC IDC MSMT LEADCHNL RV SENSING INTR AMPL: 8.125 mV
MDC IDC SET LEADCHNL RV PACING PULSEWIDTH: 0.4 ms
MDC IDC STAT BRADY AP VP PERCENT: 0 %
MDC IDC STAT BRADY AP VS PERCENT: 7.15 %
MDC IDC STAT BRADY AS VP PERCENT: 0.03 %
MDC IDC STAT BRADY AS VS PERCENT: 92.82 %

## 2018-01-18 ENCOUNTER — Encounter: Payer: Self-pay | Admitting: Internal Medicine

## 2018-01-18 ENCOUNTER — Ambulatory Visit (INDEPENDENT_AMBULATORY_CARE_PROVIDER_SITE_OTHER): Payer: Medicare Other | Admitting: Internal Medicine

## 2018-01-18 VITALS — BP 114/70 | HR 83 | Ht 67.0 in | Wt 180.8 lb

## 2018-01-18 DIAGNOSIS — I5022 Chronic systolic (congestive) heart failure: Secondary | ICD-10-CM

## 2018-01-18 DIAGNOSIS — I472 Ventricular tachycardia, unspecified: Secondary | ICD-10-CM

## 2018-01-18 DIAGNOSIS — I2510 Atherosclerotic heart disease of native coronary artery without angina pectoris: Secondary | ICD-10-CM

## 2018-01-18 DIAGNOSIS — I480 Paroxysmal atrial fibrillation: Secondary | ICD-10-CM

## 2018-01-18 DIAGNOSIS — Z95810 Presence of automatic (implantable) cardiac defibrillator: Secondary | ICD-10-CM | POA: Diagnosis not present

## 2018-01-18 LAB — CUP PACEART INCLINIC DEVICE CHECK
Battery Remaining Longevity: 65 mo
Battery Voltage: 2.98 V
Brady Statistic AP VP Percent: 0.01 %
Brady Statistic RA Percent Paced: 10.41 %
Brady Statistic RV Percent Paced: 0.04 %
Date Time Interrogation Session: 20190712135909
HIGH POWER IMPEDANCE MEASURED VALUE: 49 Ohm
HIGH POWER IMPEDANCE MEASURED VALUE: 65 Ohm
Implantable Lead Implant Date: 20071003
Implantable Lead Location: 753860
Implantable Lead Model: 6947
Implantable Pulse Generator Implant Date: 20140729
Lead Channel Impedance Value: 304 Ohm
Lead Channel Pacing Threshold Amplitude: 0.5 V
Lead Channel Pacing Threshold Pulse Width: 0.4 ms
Lead Channel Sensing Intrinsic Amplitude: 12 mV
Lead Channel Setting Pacing Amplitude: 2 V
Lead Channel Setting Sensing Sensitivity: 0.3 mV
MDC IDC LEAD IMPLANT DT: 20071019
MDC IDC LEAD LOCATION: 753859
MDC IDC MSMT LEADCHNL RA IMPEDANCE VALUE: 456 Ohm
MDC IDC MSMT LEADCHNL RA SENSING INTR AMPL: 3.25 mV
MDC IDC MSMT LEADCHNL RV IMPEDANCE VALUE: 399 Ohm
MDC IDC MSMT LEADCHNL RV PACING THRESHOLD AMPLITUDE: 1 V
MDC IDC MSMT LEADCHNL RV PACING THRESHOLD PULSEWIDTH: 0.4 ms
MDC IDC SET LEADCHNL RV PACING AMPLITUDE: 2.5 V
MDC IDC SET LEADCHNL RV PACING PULSEWIDTH: 0.4 ms
MDC IDC STAT BRADY AP VS PERCENT: 10.45 %
MDC IDC STAT BRADY AS VP PERCENT: 0.03 %
MDC IDC STAT BRADY AS VS PERCENT: 89.51 %

## 2018-01-18 NOTE — Patient Instructions (Signed)
Medication Instructions:  Your physician recommends that you continue on your current medications as directed. Please refer to the Current Medication list given to you today.  Labwork: None ordered.  Testing/Procedures: None ordered.  Follow-Up: Your physician wants you to follow-up in: one year with Dr. Lovena Le.   You will receive a reminder letter in the mail two months in advance. If you don't receive a letter, please call our office to schedule the follow-up appointment.  Remote monitoring is used to monitor your ICD from home. This monitoring reduces the number of office visits required to check your device to one time per year. It allows [place] to keep an eye on the functioning of your device to ensure it is working properly. You are scheduled for a device check from home on 03/12/2018. You may send your transmission at any time that day. If you have a wireless device, the transmission will be sent automatically. After your physician reviews your transmission, you will receive a postcard with your next transmission date.  Any Other Special Instructions Will Be Listed Below (If Applicable).  If you need a refill on your cardiac medications before your next appointment, please call your pharmacy.

## 2018-01-18 NOTE — Progress Notes (Signed)
HPI Rachel Bates returns today for followup. She is a very pleasant 72 year old woman with an ischemic cardiomyopathy, chronic class II systolic heart failure, and ventricular tachycardia. The patient denies palpitations.  No edema. No ICD shock. No muscle aches or pains. She has undergone hip replacement surgery and is pending knee replacement.   Allergies  Allergen Reactions  . Sumatriptan Succinate Other (See Comments)    Chest pain, no triptans, pt states "it makes my heart race"  . Amitriptyline Other (See Comments)    Weight gain     Current Outpatient Medications  Medication Sig Dispense Refill  . B Complex-C-Folic Acid (STRESS FORMULA) TABS Take 1 tablet by mouth daily.     . baclofen (LIORESAL) 10 MG tablet Take 1 tablet (10 mg total) by mouth daily as needed (MIGRAINES). Or for muscle spasm 30 each 0  . Calcium Carbonate-Vitamin D (CALCIUM-D) 600-400 MG-UNIT TABS Take 1 tablet by mouth 2 (two) times daily.    . carvedilol (COREG) 12.5 MG tablet Take 12.5 mg by mouth 2 (two) times daily with a meal.     . Cholecalciferol (VITAMIN D-3) 1000 UNITS CAPS Take 1 capsule by mouth daily.    . diphenhydrAMINE (BENADRYL) 25 mg capsule Take 25 mg by mouth daily.    Marland Kitchen ELIQUIS 5 MG TABS tablet TAKE 1 TABLET BY MOUTH TWICE A DAY 60 tablet 5  . FIBER PO Take 1 capsule by mouth daily.    . Omega-3 Fatty Acids (FISH OIL) 1000 MG CAPS Take 1 capsule by mouth daily.     . ramipril (ALTACE) 2.5 MG tablet Take 2.5 mg by mouth daily.      . sertraline (ZOLOFT) 100 MG tablet Take 100 mg by mouth at bedtime.    . simvastatin (ZOCOR) 40 MG tablet Take 40 mg by mouth at bedtime.      . vitamin E 400 UNIT capsule Take 400 Units by mouth daily.    Marland Kitchen zonisamide (ZONEGRAN) 50 MG capsule Take 100 mg by mouth daily.      No current facility-administered medications for this visit.      Past Medical History:  Diagnosis Date  . AICD (automatic cardioverter/defibrillator) present    Medtronic     . Atrial fibrillation ([place])   . CAD (coronary artery disease)    MI and 2 stents 1995 in [place]  . CHF (congestive heart failure) (HCC)    mild  . Depression   . 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  Marland Kitchen Dyslipidemia   . Headache    migraines  . Ischemic cardiomyopathy   . Myocardial infarct ([place]) 1995   MI and 2 stents in 1995 in [place]  . Presence of permanent cardiac pacemaker   . Primary localized osteoarthritis of right hip 12/18/2017  . Pulmonary embolism ([place]) 2012  . Sleep apnea    no CPAP  . Ventricular tachycardia ([place])     ROS:   All systems reviewed and negative except as noted in the HPI.   Past Surgical History:  Procedure Laterality Date  . CARDIAC DEFIBRILLATOR PLACEMENT    . IMPLANTABLE CARDIOVERTER DEFIBRILLATOR GENERATOR CHANGE N/A 02/04/2013   Procedure: IMPLANTABLE CARDIOVERTER DEFIBRILLATOR GENERATOR CHANGE;  Surgeon: Evans Lance, MD;  Location[facility] C[facility] LAB;  Service: Cardiovascular;  Laterality: N/A;  . SHOULDER ARTHROSCOPY     right  . TOTAL HIP ARTHROPLASTY Right 12/18/2017   Procedure: RIGHT TOTAL HIP ARTHROPLASTY;  Surgeon: Marchia Bond, MD;  Location: [facility];  Service: Orthopedics;  Laterality: Right;  . TUBAL LIGATION       Family History  Problem Relation Age of Onset  . Hypertension Mother   . Thyroid disease Mother   . Alzheimer's disease Mother   . Coronary artery disease Father   . Pulmonary embolism Father   . Congestive Heart Failure Maternal Grandmother   . Hypertension Maternal Grandmother   . Heart attack Maternal Grandfather   . Other Maternal Grandfather        carotid disease  . Dementia Paternal Grandmother   . Other Paternal Grandfather 65       accident     Social History   Socioeconomic History  . Marital status: Married    Spouse name: Not on file  . Number of children: Not on file  . Years of education: Not on file  . Highest education level: Not on file  Occupational History   . Not on file  Social Needs  . Financial resource strain: Not on file  . Food insecurity:    Worry: Not on file    Inability: Not on file  . Transportation needs:    Medical: Not on file    Non-medical: Not on file  Tobacco Use  . Smoking status: Former Research scientist (life sciences)  . Smokeless tobacco: Never Used  Substance and Sexual Activity  . Alcohol use: No  . Drug use: No  . Sexual activity: Not on file  Lifestyle  . Physical activity:    Days per week: Not on file    Minutes per session: Not on file  . Stress: Not on file  Relationships  . Social connections:    Talks on phone: Not on file    Gets together: Not on file    Attends religious service: Not on file    Active member of club or organization: Not on file    Attends meetings of clubs or organizations: Not on file    Relationship status: Not on file  . Intimate partner violence:    Fear of current or ex partner: Not on file    Emotionally abused: Not on file    Physically abused: Not on file    Forced sexual activity: Not on file  Other Topics Concern  . Not on file  Social History Narrative  . Not on file     BP 114/70   Pulse 83   Ht 5\' 7"  (1.702 m)   Wt 180 lb 12.8 oz (82 kg)   BMI 28.32 kg/m   Physical Exam:  Well appearing 72 yo woman, NAD HEENT: Unremarkable Neck:  6 cm JVD, no thyromegally Lymphatics:  No adenopathy Back:  No CVA tenderness Lungs:  Clear HEART:  Regular rate rhythm, no murmurs, no rubs, no clicks Abd:  soft, positive bowel sounds, no organomegally, no rebound, no guarding Ext:  2 plus pulses, no edema, no cyanosis, no clubbing Skin:  No rashes no nodules Neuro:  CN II through XII intact, motor grossly intact  EKG - nsr with anterior MI  DEVICE  Normal device function.  See PaceArt for details.   Assess/Plan: 1. ICD - her medtronic device is working normally. 2. Chronic systolic heart failure - her symptoms are class 2. She will continue her current meds. 3. VT - she has had none  since her last check.  Mikle Bosworth.D.

## 2018-01-23 DIAGNOSIS — M6281 Muscle weakness (generalized): Secondary | ICD-10-CM | POA: Diagnosis not present

## 2018-01-23 DIAGNOSIS — R2689 Other abnormalities of gait and mobility: Secondary | ICD-10-CM | POA: Diagnosis not present

## 2018-01-23 DIAGNOSIS — Z471 Aftercare following joint replacement surgery: Secondary | ICD-10-CM | POA: Diagnosis not present

## 2018-01-23 DIAGNOSIS — Z96641 Presence of right artificial hip joint: Secondary | ICD-10-CM | POA: Diagnosis not present

## 2018-01-24 DIAGNOSIS — C44712 Basal cell carcinoma of skin of right lower limb, including hip: Secondary | ICD-10-CM | POA: Diagnosis not present

## 2018-01-24 DIAGNOSIS — L578 Other skin changes due to chronic exposure to nonionizing radiation: Secondary | ICD-10-CM | POA: Diagnosis not present

## 2018-01-30 DIAGNOSIS — M1711 Unilateral primary osteoarthritis, right knee: Secondary | ICD-10-CM | POA: Diagnosis not present

## 2018-01-30 DIAGNOSIS — M25562 Pain in left knee: Secondary | ICD-10-CM | POA: Diagnosis not present

## 2018-03-04 DIAGNOSIS — Z23 Encounter for immunization: Secondary | ICD-10-CM | POA: Diagnosis not present

## 2018-03-04 DIAGNOSIS — G518 Other disorders of facial nerve: Secondary | ICD-10-CM | POA: Diagnosis not present

## 2018-03-04 DIAGNOSIS — G43719 Chronic migraine without aura, intractable, without status migrainosus: Secondary | ICD-10-CM | POA: Diagnosis not present

## 2018-03-04 DIAGNOSIS — M542 Cervicalgia: Secondary | ICD-10-CM | POA: Diagnosis not present

## 2018-03-04 DIAGNOSIS — R51 Headache: Secondary | ICD-10-CM | POA: Diagnosis not present

## 2018-03-04 DIAGNOSIS — M791 Myalgia, unspecified site: Secondary | ICD-10-CM | POA: Diagnosis not present

## 2018-03-04 DIAGNOSIS — G43839 Menstrual migraine, intractable, without status migrainosus: Secondary | ICD-10-CM | POA: Diagnosis not present

## 2018-03-12 ENCOUNTER — Telehealth: Payer: Self-pay | Admitting: Cardiology

## 2018-03-12 ENCOUNTER — Ambulatory Visit (INDEPENDENT_AMBULATORY_CARE_PROVIDER_SITE_OTHER): Payer: Medicare Other | Admitting: *Deleted

## 2018-03-12 DIAGNOSIS — I472 Ventricular tachycardia, unspecified: Secondary | ICD-10-CM

## 2018-03-12 NOTE — Progress Notes (Signed)
Remote ICD transmission.   

## 2018-03-12 NOTE — Telephone Encounter (Signed)
Spoke with pt and reminded pt of remote transmission that is due today. Pt verbalized understanding.   

## 2018-03-19 DIAGNOSIS — C44712 Basal cell carcinoma of skin of right lower limb, including hip: Secondary | ICD-10-CM | POA: Diagnosis not present

## 2018-03-27 DIAGNOSIS — F339 Major depressive disorder, recurrent, unspecified: Secondary | ICD-10-CM | POA: Diagnosis not present

## 2018-03-27 DIAGNOSIS — R32 Unspecified urinary incontinence: Secondary | ICD-10-CM | POA: Diagnosis not present

## 2018-03-27 DIAGNOSIS — I2510 Atherosclerotic heart disease of native coronary artery without angina pectoris: Secondary | ICD-10-CM | POA: Diagnosis not present

## 2018-03-27 DIAGNOSIS — R739 Hyperglycemia, unspecified: Secondary | ICD-10-CM | POA: Diagnosis not present

## 2018-03-27 DIAGNOSIS — E782 Mixed hyperlipidemia: Secondary | ICD-10-CM | POA: Diagnosis not present

## 2018-03-28 ENCOUNTER — Inpatient Hospital Stay (HOSPITAL_COMMUNITY): Admission: RE | Admit: 2018-03-28 | Payer: Medicare Other | Source: Ambulatory Visit

## 2018-03-29 DIAGNOSIS — M1712 Unilateral primary osteoarthritis, left knee: Secondary | ICD-10-CM | POA: Diagnosis not present

## 2018-04-02 ENCOUNTER — Encounter (HOSPITAL_COMMUNITY): Admission: RE | Payer: Self-pay | Source: Ambulatory Visit

## 2018-04-02 ENCOUNTER — Inpatient Hospital Stay (HOSPITAL_COMMUNITY): Admission: RE | Admit: 2018-04-02 | Payer: Medicare Other | Source: Ambulatory Visit | Admitting: Orthopedic Surgery

## 2018-04-02 SURGERY — ARTHROPLASTY, PATELLOFEMORAL
Anesthesia: Choice | Site: Knee | Laterality: Right

## 2018-04-03 LAB — CUP PACEART REMOTE DEVICE CHECK
Battery Remaining Longevity: 57 mo
Battery Voltage: 2.98 V
Brady Statistic AS VS Percent: 97.22 %
Date Time Interrogation Session: 20190903175028
HighPow Impedance: 54 Ohm
HighPow Impedance: 66 Ohm
Implantable Lead Implant Date: 20071003
Implantable Lead Location: 753859
Implantable Lead Model: 6947
Implantable Pulse Generator Implant Date: 20140729
Lead Channel Impedance Value: 399 Ohm
Lead Channel Pacing Threshold Amplitude: 0.5 V
Lead Channel Pacing Threshold Pulse Width: 0.4 ms
Lead Channel Sensing Intrinsic Amplitude: 2.875 mV
Lead Channel Sensing Intrinsic Amplitude: 2.875 mV
MDC IDC LEAD IMPLANT DT: 20071019
MDC IDC LEAD LOCATION: 753860
MDC IDC MSMT LEADCHNL RA IMPEDANCE VALUE: 475 Ohm
MDC IDC MSMT LEADCHNL RV IMPEDANCE VALUE: 361 Ohm
MDC IDC MSMT LEADCHNL RV PACING THRESHOLD AMPLITUDE: 1 V
MDC IDC MSMT LEADCHNL RV PACING THRESHOLD PULSEWIDTH: 0.4 ms
MDC IDC MSMT LEADCHNL RV SENSING INTR AMPL: 8.125 mV
MDC IDC MSMT LEADCHNL RV SENSING INTR AMPL: 8.125 mV
MDC IDC SET LEADCHNL RA PACING AMPLITUDE: 2 V
MDC IDC SET LEADCHNL RV PACING AMPLITUDE: 2.5 V
MDC IDC SET LEADCHNL RV PACING PULSEWIDTH: 0.4 ms
MDC IDC SET LEADCHNL RV SENSING SENSITIVITY: 0.3 mV
MDC IDC STAT BRADY AP VP PERCENT: 0 %
MDC IDC STAT BRADY AP VS PERCENT: 2.74 %
MDC IDC STAT BRADY AS VP PERCENT: 0.04 %
MDC IDC STAT BRADY RA PERCENT PACED: 2.74 %
MDC IDC STAT BRADY RV PERCENT PACED: 0.04 %

## 2018-04-26 DIAGNOSIS — M170 Bilateral primary osteoarthritis of knee: Secondary | ICD-10-CM | POA: Diagnosis not present

## 2018-05-17 ENCOUNTER — Encounter (HOSPITAL_COMMUNITY): Payer: Self-pay

## 2018-05-17 NOTE — Patient Instructions (Addendum)
Your procedure is scheduled on: Tuesday, Nov. 19, 2019     Surgery Time:  98:33AS-50:[id number]     Report to [place]  Entrance,  Report to admitting at 8:00 AM    Call this number if you have problems the morning of surgery [phone]       Do not eat food or drink liquids :After Midnight.   Brush your teeth the morning of surgery.  No candy, gum, mint.      Take these medicines the morning of surgery with A SIP OF WATER:    Carvedilol, Zonisarnide                                   You may not have any metal on your body including hair pins, jewelry, and body piercings              Do not wear make-up, lotions, powders, perfumes/cologne, or deodorant              Do not wear nail polish.  Do not shave  48 hours prior to surgery.                 Do not bring valuables to the hospital. [place].   Contacts, dentures or bridgework may not be worn into surgery.   Leave suitcase in the car. After surgery it may be brought to your room.         [facility] - Preparing for Surgery Before surgery, you can play an important role.  Because skin is not sterile, your skin needs to be as free of germs as possible.  You can reduce the number of germs on your skin by washing with CHG (chlorahexidine gluconate) soap before surgery.  CHG is an antiseptic cleaner which kills germs and bonds with the skin to continue killing germs even after washing. Please DO NOT use if you have an allergy to CHG or antibacterial soaps.  If your skin becomes reddened/irritated stop using the CHG and inform your nurse when you arrive at Short Stay. Do not shave (including legs and underarms) for at least 48 hours prior to the first CHG shower.  You may shave your face/neck.  Please follow these instructions carefully:  1.  Shower with CHG Soap the night before surgery and the  morning of surgery.  2.  If you choose to wash  your hair, wash your hair first as usual with your normal  shampoo.  3.  After you shampoo, rinse your hair and body thoroughly to remove the shampoo.                             4.  Use CHG as you would any other liquid soap.  You can apply chg directly to the skin and wash.  Gently with a scrungie or clean washcloth.  5.  Apply the CHG Soap to your body ONLY FROM THE NECK DOWN.   Do not use on face/ open                           Wound or open sores. Avoid contact with eyes, ears mouth and genitals (private parts).  Wash face,  Genitals (private parts) with your normal soap.             6.  Wash thoroughly, paying special attention to the area where your surgery  will be performed.  7.  Thoroughly rinse your body with warm water from the neck down.  8.  DO NOT shower/wash with your normal soap after using and rinsing off the CHG Soap.             9.  Pat yourself dry with a clean towel.            10.  Wear clean pajamas.            11.  Place clean sheets on your bed the night of your first shower and do not  sleep with pets. Day of Surgery : Do not apply any lotions/deodorants the morning of surgery.  Please wear clean clothes to the hospital/surgery center.  FAILURE TO FOLLOW THESE INSTRUCTIONS MAY RESULT IN THE CANCELLATION OF YOUR SURGERY  PATIENT SIGNATURE_________________________________  NURSE SIGNATURE__________________________________  ________________________________________________________________________   Adam Phenix  An incentive spirometer is a tool that can help keep your lungs clear and active. This tool measures how well you are filling your lungs with each breath. Taking long deep breaths may help reverse or decrease the chance of developing breathing (pulmonary) problems (especially infection) following:  A long period of time when you are unable to move or be active. BEFORE THE PROCEDURE   If the spirometer includes an indicator to  show your best effort, your nurse or respiratory therapist will set it to a desired goal.  If possible, sit up straight or lean slightly forward. Try not to slouch.  Hold the incentive spirometer in an upright position. INSTRUCTIONS FOR USE  1. Sit on the edge of your bed if possible, or sit up as far as you can in bed or on a chair. 2. Hold the incentive spirometer in an upright position. 3. Breathe out normally. 4. Place the mouthpiece in your mouth and seal your lips tightly around it. 5. Breathe in slowly and as deeply as possible, raising the piston or the ball toward the top of the column. 6. Hold your breath for 3-5 seconds or for as long as possible. Allow the piston or ball to fall to the bottom of the column. 7. Remove the mouthpiece from your mouth and breathe out normally. 8. Rest for a few seconds and repeat Steps 1 through 7 at least 10 times every 1-2 hours when you are awake. Take your time and take a few normal breaths between deep breaths. 9. The spirometer may include an indicator to show your best effort. Use the indicator as a goal to work toward during each repetition. 10. After each set of 10 deep breaths, practice coughing to be sure your lungs are clear. If you have an incision (the cut made at the time of surgery), support your incision when coughing by placing a pillow or rolled up towels firmly against it. Once you are able to get out of bed, walk around indoors and cough well. You may stop using the incentive spirometer when instructed by your caregiver.  RISKS AND COMPLICATIONS  Take your time so you do not get dizzy or light-headed.  If you are in pain, you may need to take or ask for pain medication before doing incentive spirometry. It is harder to take a deep breath if you are having pain. AFTER USE  Rest and breathe slowly and easily.  It can be helpful to keep track of a log of your progress. Your caregiver can provide you with a simple table to help with  this. If you are using the spirometer at home, follow these instructions: [place] IF:   You are having difficultly using the spirometer.  You have trouble using the spirometer as often as instructed.  Your pain medication is not giving enough relief while using the spirometer.  You develop fever of 100.5 F (38.1 C) or higher. SEEK IMMEDIATE MEDICAL CARE IF:   You cough up bloody sputum that had not been present before.  You develop fever of 102 F (38.9 C) or greater.  You develop worsening pain at or near the incision site. MAKE SURE YOU:   Understand these instructions.  Will watch your condition.  Will get help right away if you are not doing well or get worse. Document Released: 11/06/2006 Document Revised: 09/18/2011 Document Reviewed: 01/07/2007 [facility] Patient Information 2014 [place], [place].   ________________________________________________________________________

## 2018-05-21 ENCOUNTER — Other Ambulatory Visit: Payer: Self-pay

## 2018-05-21 ENCOUNTER — Encounter (HOSPITAL_COMMUNITY): Payer: Self-pay

## 2018-05-21 ENCOUNTER — Encounter (HOSPITAL_COMMUNITY)
Admission: RE | Admit: 2018-05-21 | Discharge: 2018-05-21 | Disposition: A | Discharge status: Home / Self Care | Payer: Medicare Other | Source: Ambulatory Visit | Attending: Orthopedic Surgery | Admitting: Orthopedic Surgery

## 2018-05-21 DIAGNOSIS — M1711 Unilateral primary osteoarthritis, right knee: Secondary | ICD-10-CM | POA: Insufficient documentation

## 2018-05-21 DIAGNOSIS — Z01812 Encounter for preprocedural laboratory examination: Secondary | ICD-10-CM | POA: Diagnosis not present

## 2018-05-21 HISTORY — DX: Hypersomnia, unspecified: G47.10

## 2018-05-21 HISTORY — DX: Anemia, unspecified: D64.9

## 2018-05-21 HISTORY — DX: Presence of coronary angioplasty implant and graft: Z95.5

## 2018-05-21 HISTORY — DX: Chronic systolic (congestive) heart failure: I50.22

## 2018-05-21 HISTORY — DX: Personal history of other diseases of the circulatory system: Z86.79

## 2018-05-21 HISTORY — DX: Paroxysmal atrial fibrillation: I48.0

## 2018-05-21 HISTORY — DX: Migraine, unspecified, not intractable, without status migrainosus: G43.909

## 2018-05-21 HISTORY — DX: Unspecified osteoarthritis, unspecified site: M19.90

## 2018-05-21 HISTORY — DX: Anxiety disorder, unspecified: F41.9

## 2018-05-21 HISTORY — DX: Personal history of other malignant neoplasm of skin: Z85.828

## 2018-05-21 HISTORY — DX: Long term (current) use of anticoagulants: Z79.01

## 2018-05-21 HISTORY — DX: Old myocardial infarction: I25.2

## 2018-05-21 HISTORY — DX: Personal history of other malignant neoplasm of skin: Z98.890

## 2018-05-21 HISTORY — DX: Personal history of pulmonary embolism: Z86.711

## 2018-05-21 HISTORY — DX: Personal history of other venous thrombosis and embolism: Z86.718

## 2018-05-21 LAB — CBC
HCT: 41.3 % (ref 36.0–46.0)
HEMOGLOBIN: 12.6 g/dL (ref 12.0–15.0)
MCH: 24.7 pg — AB (ref 26.0–34.0)
MCHC: 30.5 g/dL (ref 30.0–36.0)
MCV: 80.8 fL (ref 80.0–100.0)
Platelets: 141 10*3/uL — ABNORMAL LOW (ref 150–400)
RBC: 5.11 MIL/uL (ref 3.87–5.11)
RDW: 17.6 % — ABNORMAL HIGH (ref 11.5–15.5)
WBC: 4.3 10*3/uL (ref 4.0–10.5)
nRBC: 0 % (ref 0.0–0.2)

## 2018-05-21 LAB — BASIC METABOLIC PANEL
Anion gap: 7 (ref 5–15)
BUN: 13 mg/dL (ref 8–23)
CHLORIDE: 106 mmol/L (ref 98–111)
CO2: 26 mmol/L (ref 22–32)
CREATININE: 0.83 mg/dL (ref 0.44–1.00)
Calcium: 9.1 mg/dL (ref 8.9–10.3)
GFR calc Af Amer: >60 mL/min (ref >60)
GFR calc non Af Amer: >60 mL/min (ref >60)
GLUCOSE: 110 mg/dL — AB (ref 70–99)
POTASSIUM: 4.1 mmol/L (ref 3.5–5.1)
Sodium: 139 mmol/L (ref 135–145)

## 2018-05-21 LAB — SURGICAL PCR SCREEN
MRSA, PCR: NEGATIVE
Staphylococcus aureus: POSITIVE — AB

## 2018-05-21 NOTE — Progress Notes (Signed)
Cardiac clearance for this procedure in epic w/ office visit dated 10-01-2017, dr gregg taylor and lov note dated 01-18-2018. Last pace check check in epic 03-12-2018. EKG dated 01-18-2018 in epic. Clearance from dr Lindi Adie dated 02-04-2018 in chart.  AICD/ pacemaker device orders dated 04-29-2018 in chart.

## 2018-05-24 ENCOUNTER — Other Ambulatory Visit: Payer: Self-pay | Admitting: Orthopedic Surgery

## 2018-05-24 NOTE — Care Plan (Signed)
Spoke with patient. She will discharge to home with family. Her post op therapy will be determined after surgery. IF TKR will need HHPT. Referral to [facility]. She is agreeable to this    Rachel Bates, [place]

## 2018-05-28 ENCOUNTER — Inpatient Hospital Stay (HOSPITAL_COMMUNITY): Payer: Medicare Other | Admitting: Anesthesiology

## 2018-05-28 ENCOUNTER — Encounter (HOSPITAL_COMMUNITY): Payer: Self-pay | Admitting: *Deleted

## 2018-05-28 ENCOUNTER — Other Ambulatory Visit: Payer: Self-pay

## 2018-05-28 ENCOUNTER — Observation Stay (HOSPITAL_COMMUNITY)
Admission: RE | Admit: 2018-05-28 | Discharge: 2018-05-29 | Disposition: A | Discharge status: Home / Self Care | Payer: Medicare Other | Source: Ambulatory Visit | Attending: Orthopedic Surgery | Admitting: Orthopedic Surgery

## 2018-05-28 ENCOUNTER — Inpatient Hospital Stay (HOSPITAL_COMMUNITY): Payer: Medicare Other

## 2018-05-28 ENCOUNTER — Encounter (HOSPITAL_COMMUNITY)
Admission: RE | Disposition: A | Discharge status: Home / Self Care | Payer: Self-pay | Source: Ambulatory Visit | Attending: Orthopedic Surgery

## 2018-05-28 DIAGNOSIS — Z87891 Personal history of nicotine dependence: Secondary | ICD-10-CM | POA: Insufficient documentation

## 2018-05-28 DIAGNOSIS — Z86711 Personal history of pulmonary embolism: Secondary | ICD-10-CM | POA: Diagnosis not present

## 2018-05-28 DIAGNOSIS — G8918 Other acute postprocedural pain: Secondary | ICD-10-CM | POA: Diagnosis not present

## 2018-05-28 DIAGNOSIS — Z96651 Presence of right artificial knee joint: Secondary | ICD-10-CM | POA: Diagnosis not present

## 2018-05-28 DIAGNOSIS — D696 Thrombocytopenia, unspecified: Secondary | ICD-10-CM | POA: Diagnosis not present

## 2018-05-28 DIAGNOSIS — G4710 Hypersomnia, unspecified: Secondary | ICD-10-CM | POA: Diagnosis not present

## 2018-05-28 DIAGNOSIS — Z96659 Presence of unspecified artificial knee joint: Secondary | ICD-10-CM

## 2018-05-28 DIAGNOSIS — M1990 Unspecified osteoarthritis, unspecified site: Secondary | ICD-10-CM | POA: Diagnosis not present

## 2018-05-28 DIAGNOSIS — I110 Hypertensive heart disease with heart failure: Secondary | ICD-10-CM | POA: Insufficient documentation

## 2018-05-28 DIAGNOSIS — F329 Major depressive disorder, single episode, unspecified: Secondary | ICD-10-CM | POA: Insufficient documentation

## 2018-05-28 DIAGNOSIS — I255 Ischemic cardiomyopathy: Secondary | ICD-10-CM | POA: Diagnosis not present

## 2018-05-28 DIAGNOSIS — G43909 Migraine, unspecified, not intractable, without status migrainosus: Secondary | ICD-10-CM | POA: Insufficient documentation

## 2018-05-28 DIAGNOSIS — Z7901 Long term (current) use of anticoagulants: Secondary | ICD-10-CM | POA: Diagnosis not present

## 2018-05-28 DIAGNOSIS — I480 Paroxysmal atrial fibrillation: Secondary | ICD-10-CM | POA: Insufficient documentation

## 2018-05-28 DIAGNOSIS — Z95810 Presence of automatic (implantable) cardiac defibrillator: Secondary | ICD-10-CM | POA: Insufficient documentation

## 2018-05-28 DIAGNOSIS — Z79899 Other long term (current) drug therapy: Secondary | ICD-10-CM | POA: Diagnosis not present

## 2018-05-28 DIAGNOSIS — Z955 Presence of coronary angioplasty implant and graft: Secondary | ICD-10-CM | POA: Insufficient documentation

## 2018-05-28 DIAGNOSIS — I5022 Chronic systolic (congestive) heart failure: Secondary | ICD-10-CM | POA: Diagnosis not present

## 2018-05-28 DIAGNOSIS — G4730 Sleep apnea, unspecified: Secondary | ICD-10-CM | POA: Insufficient documentation

## 2018-05-28 DIAGNOSIS — F419 Anxiety disorder, unspecified: Secondary | ICD-10-CM | POA: Insufficient documentation

## 2018-05-28 DIAGNOSIS — Z85828 Personal history of other malignant neoplasm of skin: Secondary | ICD-10-CM | POA: Insufficient documentation

## 2018-05-28 DIAGNOSIS — E785 Hyperlipidemia, unspecified: Secondary | ICD-10-CM | POA: Diagnosis not present

## 2018-05-28 DIAGNOSIS — I252 Old myocardial infarction: Secondary | ICD-10-CM | POA: Insufficient documentation

## 2018-05-28 DIAGNOSIS — I5020 Unspecified systolic (congestive) heart failure: Secondary | ICD-10-CM | POA: Insufficient documentation

## 2018-05-28 DIAGNOSIS — M1711 Unilateral primary osteoarthritis, right knee: Secondary | ICD-10-CM | POA: Diagnosis not present

## 2018-05-28 DIAGNOSIS — Z86718 Personal history of other venous thrombosis and embolism: Secondary | ICD-10-CM | POA: Diagnosis not present

## 2018-05-28 DIAGNOSIS — Z471 Aftercare following joint replacement surgery: Secondary | ICD-10-CM | POA: Diagnosis not present

## 2018-05-28 DIAGNOSIS — I2510 Atherosclerotic heart disease of native coronary artery without angina pectoris: Secondary | ICD-10-CM | POA: Insufficient documentation

## 2018-05-28 HISTORY — DX: Unilateral primary osteoarthritis, right knee: M17.11

## 2018-05-28 HISTORY — PX: TOTAL KNEE ARTHROPLASTY: SHX125

## 2018-05-28 SURGERY — ARTHROPLASTY, KNEE, TOTAL
Anesthesia: General | Site: Knee | Laterality: Right

## 2018-05-28 MED ORDER — KETOROLAC TROMETHAMINE 30 MG/ML IJ SOLN
INTRAMUSCULAR | Status: DC | PRN
  Administered 2018-05-28: 30 mg

## 2018-05-28 MED ORDER — LACTATED RINGERS IV SOLN
INTRAVENOUS | Status: DC
  Administered 2018-05-28 (×2): via INTRAVENOUS

## 2018-05-28 MED ORDER — SUGAMMADEX SODIUM 200 MG/2ML IV SOLN
INTRAVENOUS | Status: AC
  Filled 2018-05-28: qty 2

## 2018-05-28 MED ORDER — FENTANYL CITRATE (PF) 250 MCG/5ML IJ SOLN
INTRAMUSCULAR | Status: AC
  Filled 2018-05-28: qty 5

## 2018-05-28 MED ORDER — GABAPENTIN 300 MG PO CAPS
300.0000 mg | ORAL_CAPSULE | Freq: Once | ORAL | Status: AC
  Administered 2018-05-28: 300 mg via ORAL
  Filled 2018-05-28: qty 1

## 2018-05-28 MED ORDER — TRAMADOL HCL 50 MG PO TABS
50.0000 mg | ORAL_TABLET | Freq: Four times a day (QID) | ORAL | Status: DC
  Administered 2018-05-28 – 2018-05-29 (×3): 50 mg via ORAL
  Filled 2018-05-28 (×4): qty 1

## 2018-05-28 MED ORDER — ROPIVACAINE HCL 7.5 MG/ML IJ SOLN
INTRAMUSCULAR | Status: DC | PRN
  Administered 2018-05-28: 20 mL via PERINEURAL

## 2018-05-28 MED ORDER — KETOROLAC TROMETHAMINE 30 MG/ML IJ SOLN
INTRAMUSCULAR | Status: AC
  Filled 2018-05-28: qty 1

## 2018-05-28 MED ORDER — ROCURONIUM BROMIDE 100 MG/10ML IV SOLN
INTRAVENOUS | Status: DC | PRN
  Administered 2018-05-28: 50 mg via INTRAVENOUS

## 2018-05-28 MED ORDER — SENNA-DOCUSATE SODIUM 8.6-50 MG PO TABS: 2.0000 | ORAL_TABLET | Freq: Every day | ORAL | 1 refills | Status: DC

## 2018-05-28 MED ORDER — POLYETHYLENE GLYCOL 3350 17 G PO PACK: 17.0000 g | PACK | Freq: Every day | ORAL | Status: DC | PRN

## 2018-05-28 MED ORDER — SODIUM CHLORIDE 0.9 % IR SOLN
Status: DC | PRN
  Administered 2018-05-28: 1000 mL

## 2018-05-28 MED ORDER — ONDANSETRON HCL 4 MG/2ML IJ SOLN
INTRAMUSCULAR | Status: DC | PRN
  Administered 2018-05-28: 4 mg via INTRAVENOUS

## 2018-05-28 MED ORDER — ZONISAMIDE 25 MG PO CAPS
50.0000 mg | ORAL_CAPSULE | Freq: Every day | ORAL | Status: DC
  Administered 2018-05-29: 50 mg via ORAL
  Filled 2018-05-28: qty 2

## 2018-05-28 MED ORDER — PHENOL 1.4 % MT LIQD
1.0000 | OROMUCOSAL | Status: DC | PRN
  Filled 2018-05-28: qty 177

## 2018-05-28 MED ORDER — STRESS FORMULA PO TABS: 1.0000 | ORAL_TABLET | Freq: Every day | ORAL | Status: DC

## 2018-05-28 MED ORDER — CARVEDILOL 12.5 MG PO TABS
12.5000 mg | ORAL_TABLET | Freq: Two times a day (BID) | ORAL | Status: DC
  Administered 2018-05-28 – 2018-05-29 (×2): 12.5 mg via ORAL
  Filled 2018-05-28 (×2): qty 1

## 2018-05-28 MED ORDER — ROCURONIUM BROMIDE 100 MG/10ML IV SOLN
INTRAVENOUS | Status: AC
  Filled 2018-05-28: qty 1

## 2018-05-28 MED ORDER — MIDAZOLAM HCL 2 MG/2ML IJ SOLN
INTRAMUSCULAR | Status: AC
  Filled 2018-05-28: qty 2

## 2018-05-28 MED ORDER — HYDROMORPHONE HCL 1 MG/ML IJ SOLN
0.5000 mg | INTRAMUSCULAR | Status: DC | PRN
  Administered 2018-05-28: 1 mg via INTRAVENOUS
  Filled 2018-05-28 (×2): qty 1

## 2018-05-28 MED ORDER — ONDANSETRON HCL 4 MG/2ML IJ SOLN
INTRAMUSCULAR | Status: AC
  Filled 2018-05-28: qty 2

## 2018-05-28 MED ORDER — METOCLOPRAMIDE HCL 5 MG PO TABS: 5.0000 mg | ORAL_TABLET | Freq: Three times a day (TID) | ORAL | Status: DC | PRN

## 2018-05-28 MED ORDER — ZONISAMIDE 100 MG PO CAPS
100.0000 mg | ORAL_CAPSULE | Freq: Every day | ORAL | Status: DC
  Administered 2018-05-28: 100 mg via ORAL
  Filled 2018-05-28: qty 1

## 2018-05-28 MED ORDER — LIDOCAINE HCL (CARDIAC) PF 100 MG/5ML IV SOSY
PREFILLED_SYRINGE | INTRAVENOUS | Status: DC | PRN
  Administered 2018-05-28: 40 mg via INTRAVENOUS

## 2018-05-28 MED ORDER — PROPOFOL 10 MG/ML IV BOLUS
INTRAVENOUS | Status: AC
  Filled 2018-05-28: qty 40

## 2018-05-28 MED ORDER — VITAMIN E 180 MG (400 UNIT) PO CAPS
400.0000 [IU] | ORAL_CAPSULE | Freq: Every day | ORAL | Status: DC
  Administered 2018-05-28 – 2018-05-29 (×2): 400 [IU] via ORAL
  Filled 2018-05-28 (×3): qty 1

## 2018-05-28 MED ORDER — FENTANYL CITRATE (PF) 100 MCG/2ML IJ SOLN
INTRAMUSCULAR | Status: AC
  Filled 2018-05-28: qty 2

## 2018-05-28 MED ORDER — HYDROCODONE-ACETAMINOPHEN 10-325 MG PO TABS
2.0000 | ORAL_TABLET | ORAL | Status: DC | PRN
  Administered 2018-05-29: 1 via ORAL
  Filled 2018-05-28: qty 2

## 2018-05-28 MED ORDER — METHOCARBAMOL 500 MG PO TABS
500.0000 mg | ORAL_TABLET | Freq: Four times a day (QID) | ORAL | Status: DC | PRN
  Administered 2018-05-29: 500 mg via ORAL
  Filled 2018-05-28 (×2): qty 1

## 2018-05-28 MED ORDER — B COMPLEX-C PO TABS
1.0000 | ORAL_TABLET | Freq: Every day | ORAL | Status: DC
  Administered 2018-05-28 – 2018-05-29 (×2): 1 via ORAL
  Filled 2018-05-28 (×2): qty 1

## 2018-05-28 MED ORDER — DEXAMETHASONE SODIUM PHOSPHATE 10 MG/ML IJ SOLN: 8.0000 mg | Freq: Once | INTRAMUSCULAR | Status: DC

## 2018-05-28 MED ORDER — FENTANYL CITRATE (PF) 100 MCG/2ML IJ SOLN
50.0000 ug | INTRAMUSCULAR | Status: DC
  Administered 2018-05-28: 50 ug via INTRAVENOUS

## 2018-05-28 MED ORDER — CALCIUM-D 600-400 MG-UNIT PO TABS: 1.0000 | ORAL_TABLET | Freq: Every day | ORAL | Status: DC

## 2018-05-28 MED ORDER — ALUM & MAG HYDROXIDE-SIMETH 200-200-20 MG/5ML PO SUSP: 30.0000 mL | ORAL | Status: DC | PRN

## 2018-05-28 MED ORDER — ACETAMINOPHEN 325 MG PO TABS: 325.0000 mg | ORAL_TABLET | Freq: Four times a day (QID) | ORAL | Status: DC | PRN

## 2018-05-28 MED ORDER — CALCIUM CARBONATE-VITAMIN D 500-200 MG-UNIT PO TABS
1.0000 | ORAL_TABLET | Freq: Every day | ORAL | Status: DC
  Administered 2018-05-28 – 2018-05-29 (×2): 1 via ORAL
  Filled 2018-05-28 (×2): qty 1

## 2018-05-28 MED ORDER — METHOCARBAMOL 500 MG IVPB - SIMPLE MED
500.0000 mg | Freq: Four times a day (QID) | INTRAVENOUS | Status: DC | PRN
  Administered 2018-05-28: 500 mg via INTRAVENOUS
  Filled 2018-05-28: qty 50

## 2018-05-28 MED ORDER — FENTANYL CITRATE (PF) 100 MCG/2ML IJ SOLN
INTRAMUSCULAR | Status: DC | PRN
  Administered 2018-05-28 (×3): 50 ug via INTRAVENOUS

## 2018-05-28 MED ORDER — LIDOCAINE 2% (20 MG/ML) 5 ML SYRINGE
INTRAMUSCULAR | Status: AC
  Filled 2018-05-28: qty 5

## 2018-05-28 MED ORDER — FIBER PO POWD: Freq: Every day | ORAL | Status: DC

## 2018-05-28 MED ORDER — SODIUM CHLORIDE 0.9 % IV SOLN
INTRAVENOUS | Status: DC | PRN
  Administered 2018-05-28: 50 ug/min via INTRAVENOUS

## 2018-05-28 MED ORDER — PHENYLEPHRINE 40 MCG/ML (10ML) SYRINGE FOR IV PUSH (FOR BLOOD PRESSURE SUPPORT)
PREFILLED_SYRINGE | INTRAVENOUS | Status: AC
  Filled 2018-05-28: qty 10

## 2018-05-28 MED ORDER — PHENYLEPHRINE HCL 10 MG/ML IJ SOLN
INTRAMUSCULAR | Status: DC | PRN
  Administered 2018-05-28: 80 ug via INTRAVENOUS
  Administered 2018-05-28: 120 ug via INTRAVENOUS

## 2018-05-28 MED ORDER — BACLOFEN 10 MG PO TABS: 10.0000 mg | ORAL_TABLET | Freq: Three times a day (TID) | ORAL | 0 refills | Status: DC

## 2018-05-28 MED ORDER — MIDAZOLAM HCL 2 MG/2ML IJ SOLN: 1.0000 mg | INTRAMUSCULAR | Status: DC

## 2018-05-28 MED ORDER — SIMVASTATIN 20 MG PO TABS
40.0000 mg | ORAL_TABLET | Freq: Every day | ORAL | Status: DC
  Administered 2018-05-28: 40 mg via ORAL
  Filled 2018-05-28: qty 2

## 2018-05-28 MED ORDER — DEXAMETHASONE SODIUM PHOSPHATE 10 MG/ML IJ SOLN
10.0000 mg | Freq: Once | INTRAMUSCULAR | Status: AC
  Administered 2018-05-29: 10 mg via INTRAVENOUS
  Filled 2018-05-28: qty 1

## 2018-05-28 MED ORDER — BUPIVACAINE HCL (PF) 0.25 % IJ SOLN
INTRAMUSCULAR | Status: AC
  Filled 2018-05-28: qty 30

## 2018-05-28 MED ORDER — VITAMIN D 25 MCG (1000 UNIT) PO TABS
1000.0000 [IU] | ORAL_TABLET | Freq: Every day | ORAL | Status: DC
  Administered 2018-05-28 – 2018-05-29 (×2): 1000 [IU] via ORAL
  Filled 2018-05-28 (×2): qty 1

## 2018-05-28 MED ORDER — RAMIPRIL 2.5 MG PO TABS: 2.5000 mg | ORAL_TABLET | Freq: Every day | ORAL | Status: DC

## 2018-05-28 MED ORDER — METOCLOPRAMIDE HCL 5 MG/ML IJ SOLN: 5.0000 mg | Freq: Three times a day (TID) | INTRAMUSCULAR | Status: DC | PRN

## 2018-05-28 MED ORDER — CALCIUM POLYCARBOPHIL 625 MG PO TABS
625.0000 mg | ORAL_TABLET | Freq: Every day | ORAL | Status: DC
  Administered 2018-05-28 – 2018-05-29 (×2): 625 mg via ORAL
  Filled 2018-05-28 (×2): qty 1

## 2018-05-28 MED ORDER — PROPOFOL 10 MG/ML IV BOLUS
INTRAVENOUS | Status: DC | PRN
  Administered 2018-05-28: 100 mg via INTRAVENOUS

## 2018-05-28 MED ORDER — HYDROCODONE-ACETAMINOPHEN 10-325 MG PO TABS: 1.0000 | ORAL_TABLET | Freq: Four times a day (QID) | ORAL | 0 refills | Status: DC | PRN

## 2018-05-28 MED ORDER — HYDROMORPHONE HCL 1 MG/ML IJ SOLN
0.2500 mg | INTRAMUSCULAR | Status: DC | PRN
  Administered 2018-05-28 (×2): 0.5 mg via INTRAVENOUS

## 2018-05-28 MED ORDER — PHENYLEPHRINE HCL 10 MG/ML IJ SOLN
INTRAMUSCULAR | Status: AC
  Filled 2018-05-28: qty 1

## 2018-05-28 MED ORDER — ACETAMINOPHEN 500 MG PO TABS
1000.0000 mg | ORAL_TABLET | Freq: Once | ORAL | Status: AC
  Administered 2018-05-28: 1000 mg via ORAL
  Filled 2018-05-28: qty 2

## 2018-05-28 MED ORDER — POTASSIUM CHLORIDE IN NACL 20-0.45 MEQ/L-% IV SOLN
INTRAVENOUS | Status: DC
  Administered 2018-05-28: 17:00:00 via INTRAVENOUS
  Filled 2018-05-28 (×2): qty 1000

## 2018-05-28 MED ORDER — SUGAMMADEX SODIUM 200 MG/2ML IV SOLN
INTRAVENOUS | Status: DC | PRN
  Administered 2018-05-28: 200 mg via INTRAVENOUS

## 2018-05-28 MED ORDER — ONDANSETRON HCL 4 MG/2ML IJ SOLN: 4.0000 mg | Freq: Four times a day (QID) | INTRAMUSCULAR | Status: DC | PRN

## 2018-05-28 MED ORDER — FENTANYL CITRATE (PF) 100 MCG/2ML IJ SOLN
INTRAMUSCULAR | Status: AC
  Administered 2018-05-28: 50 ug via INTRAVENOUS
  Filled 2018-05-28: qty 2

## 2018-05-28 MED ORDER — DIPHENHYDRAMINE HCL 25 MG PO CAPS
25.0000 mg | ORAL_CAPSULE | Freq: Every day | ORAL | Status: DC
  Administered 2018-05-29: 25 mg via ORAL
  Filled 2018-05-28: qty 1

## 2018-05-28 MED ORDER — WATER FOR IRRIGATION, STERILE IR SOLN
Status: DC | PRN
  Administered 2018-05-28: 2000 mL via SURGICAL_CAVITY

## 2018-05-28 MED ORDER — DIPHENHYDRAMINE HCL 12.5 MG/5ML PO ELIX: 12.5000 mg | ORAL_SOLUTION | ORAL | Status: DC | PRN

## 2018-05-28 MED ORDER — HYDROCODONE-ACETAMINOPHEN 5-325 MG PO TABS
1.0000 | ORAL_TABLET | ORAL | Status: DC | PRN
  Administered 2018-05-29: 1 via ORAL
  Filled 2018-05-28: qty 1

## 2018-05-28 MED ORDER — HYDROMORPHONE HCL 1 MG/ML IJ SOLN
INTRAMUSCULAR | Status: AC
  Filled 2018-05-28: qty 1

## 2018-05-28 MED ORDER — DEXAMETHASONE SODIUM PHOSPHATE 10 MG/ML IJ SOLN
INTRAMUSCULAR | Status: AC
  Filled 2018-05-28: qty 1

## 2018-05-28 MED ORDER — FENTANYL CITRATE (PF) 100 MCG/2ML IJ SOLN
25.0000 ug | INTRAMUSCULAR | Status: DC | PRN
  Administered 2018-05-28 (×2): 50 ug via INTRAVENOUS

## 2018-05-28 MED ORDER — DOCUSATE SODIUM 100 MG PO CAPS
100.0000 mg | ORAL_CAPSULE | Freq: Two times a day (BID) | ORAL | Status: DC
  Administered 2018-05-28 – 2018-05-29 (×2): 100 mg via ORAL
  Filled 2018-05-28 (×2): qty 1

## 2018-05-28 MED ORDER — MENTHOL 3 MG MT LOZG: 1.0000 | LOZENGE | OROMUCOSAL | Status: DC | PRN

## 2018-05-28 MED ORDER — APIXABAN 5 MG PO TABS
5.0000 mg | ORAL_TABLET | Freq: Two times a day (BID) | ORAL | Status: DC
  Administered 2018-05-29: 5 mg via ORAL
  Filled 2018-05-28: qty 1

## 2018-05-28 MED ORDER — BISACODYL 10 MG RE SUPP: 10.0000 mg | Freq: Every day | RECTAL | Status: DC | PRN

## 2018-05-28 MED ORDER — CEFAZOLIN SODIUM-DEXTROSE 2-4 GM/100ML-% IV SOLN
2.0000 g | INTRAVENOUS | Status: AC
  Administered 2018-05-28: 2 g via INTRAVENOUS
  Filled 2018-05-28: qty 100

## 2018-05-28 MED ORDER — ONDANSETRON HCL 4 MG PO TABS: 4.0000 mg | ORAL_TABLET | Freq: Three times a day (TID) | ORAL | 0 refills | Status: DC | PRN

## 2018-05-28 MED ORDER — BUPIVACAINE HCL 0.25 % IJ SOLN
INTRAMUSCULAR | Status: DC | PRN
  Administered 2018-05-28: 30 mL

## 2018-05-28 MED ORDER — ACETAMINOPHEN 500 MG PO TABS
1000.0000 mg | ORAL_TABLET | Freq: Four times a day (QID) | ORAL | Status: DC
  Administered 2018-05-28 – 2018-05-29 (×3): 1000 mg via ORAL
  Filled 2018-05-28 (×3): qty 2

## 2018-05-28 MED ORDER — ZOLPIDEM TARTRATE 5 MG PO TABS: 5.0000 mg | ORAL_TABLET | Freq: Every evening | ORAL | Status: DC | PRN

## 2018-05-28 MED ORDER — ONDANSETRON HCL 4 MG PO TABS: 4.0000 mg | ORAL_TABLET | Freq: Four times a day (QID) | ORAL | Status: DC | PRN

## 2018-05-28 MED ORDER — TRANEXAMIC ACID-NACL 1000-0.7 MG/100ML-% IV SOLN
1000.0000 mg | Freq: Once | INTRAVENOUS | Status: AC
  Administered 2018-05-28: 1000 mg via INTRAVENOUS
  Filled 2018-05-28: qty 100

## 2018-05-28 MED ORDER — CEFAZOLIN SODIUM-DEXTROSE 2-4 GM/100ML-% IV SOLN
2.0000 g | Freq: Four times a day (QID) | INTRAVENOUS | Status: AC
  Administered 2018-05-28 (×2): 2 g via INTRAVENOUS
  Filled 2018-05-28 (×2): qty 100

## 2018-05-28 MED ORDER — SERTRALINE HCL 100 MG PO TABS
100.0000 mg | ORAL_TABLET | Freq: Every day | ORAL | Status: DC
  Administered 2018-05-28: 100 mg via ORAL
  Filled 2018-05-28: qty 1

## 2018-05-28 MED ORDER — GABAPENTIN 300 MG PO CAPS
300.0000 mg | ORAL_CAPSULE | Freq: Three times a day (TID) | ORAL | Status: DC
  Administered 2018-05-28 – 2018-05-29 (×3): 300 mg via ORAL
  Filled 2018-05-28 (×3): qty 1

## 2018-05-28 MED ORDER — DEXAMETHASONE SODIUM PHOSPHATE 10 MG/ML IJ SOLN
INTRAMUSCULAR | Status: DC | PRN
  Administered 2018-05-28: 8 mg via INTRAVENOUS

## 2018-05-28 MED ORDER — TRAZODONE HCL 50 MG PO TABS
25.0000 mg | ORAL_TABLET | Freq: Every day | ORAL | Status: DC
  Administered 2018-05-28 (×2): 25 mg via ORAL
  Filled 2018-05-28: qty 1

## 2018-05-28 MED ORDER — RAMIPRIL 2.5 MG PO CAPS
2.5000 mg | ORAL_CAPSULE | Freq: Every day | ORAL | Status: DC
  Administered 2018-05-28 – 2018-05-29 (×2): 2.5 mg via ORAL
  Filled 2018-05-28 (×2): qty 1

## 2018-05-28 MED ORDER — MAGNESIUM CITRATE PO SOLN: 1.0000 | Freq: Once | ORAL | Status: DC | PRN

## 2018-05-28 MED ORDER — ONDANSETRON HCL 4 MG/2ML IJ SOLN: 4.0000 mg | Freq: Once | INTRAMUSCULAR | Status: DC | PRN

## 2018-05-28 MED ORDER — METHOCARBAMOL 500 MG IVPB - SIMPLE MED
INTRAVENOUS | Status: AC
  Filled 2018-05-28: qty 50

## 2018-05-28 SURGICAL SUPPLY — 56 items
BAG SPEC THK2 15X12 ZIP CLS (MISCELLANEOUS) ×1
BAG ZIPLOCK 12X15 (MISCELLANEOUS) ×2 IMPLANT
BANDAGE ACE 6X5 VEL STRL LF (GAUZE/BANDAGES/DRESSINGS) ×3 IMPLANT
BEARING TIBIAL PS 10X71/75 (Joint) ×1 IMPLANT
BEARING TIBIAL PS 10X71/75MM (Joint) ×1 IMPLANT
BLADE SAG 18X100X1.27 (BLADE) ×6 IMPLANT
BLADE SURG 15 STRL LF DISP TIS (BLADE) ×1 IMPLANT
BLADE SURG 15 STRL SS (BLADE) ×3
BONE VNGD MED NAIL (MISCELLANEOUS) ×2 IMPLANT
BOWL SMART MIX CTS (DISPOSABLE) ×3 IMPLANT
BRNG TIB 71/75X10 POST VNG (Joint) ×1 IMPLANT
CEMENT BONE R 1X40 (Cement) ×6 IMPLANT
CLOSURE STERI-STRIP 1/2X4 (GAUZE/BANDAGES/DRESSINGS) ×1
CLSR STERI-STRIP ANTIMIC 1/2X4 (GAUZE/BANDAGES/DRESSINGS) ×2 IMPLANT
COMP FEM VG IL 67.5 RT (Knees) ×3 IMPLANT
COMPONENT FEM VG IL 67.5 RT (Knees) IMPLANT
COVER SURGICAL LIGHT HANDLE (MISCELLANEOUS) ×3 IMPLANT
COVER WAND RF STERILE (DRAPES) ×2 IMPLANT
CUFF TOURN SGL QUICK 34 (TOURNIQUET CUFF) ×3
CUFF TRNQT CYL 34X4X40X1 (TOURNIQUET CUFF) ×1 IMPLANT
DECANTER SPIKE VIAL GLASS SM (MISCELLANEOUS) ×2 IMPLANT
DISTAL FEMORAL PEG (Knees) ×3 IMPLANT
DRAPE U-SHAPE 47X51 STRL (DRAPES) ×3 IMPLANT
DRSG MEPILEX BORDER 4X8 (GAUZE/BANDAGES/DRESSINGS) ×3 IMPLANT
DURAPREP 26ML APPLICATOR (WOUND CARE) ×6 IMPLANT
ELECT REM PT RETURN 15FT ADLT (MISCELLANEOUS) ×3 IMPLANT
GLOVE BIO SURGEON STRL SZ7.5 (GLOVE) ×3 IMPLANT
GLOVE BIO SURGEON STRL SZ8 (GLOVE) ×3 IMPLANT
GLOVE BIOGEL PI IND STRL 7.0 (GLOVE) IMPLANT
GLOVE BIOGEL PI IND STRL 7.5 (GLOVE) IMPLANT
GLOVE BIOGEL PI IND STRL 8 (GLOVE) ×2 IMPLANT
GLOVE BIOGEL PI INDICATOR 7.0 (GLOVE) ×4
GLOVE BIOGEL PI INDICATOR 7.5 (GLOVE) ×10
GLOVE BIOGEL PI INDICATOR 8 (GLOVE) ×4
GOWN STRL REUS W/TWL 2XL LVL3 (GOWN DISPOSABLE) ×3 IMPLANT
GOWN STRL REUS W/TWL LRG LVL3 (GOWN DISPOSABLE) ×5 IMPLANT
GOWN STRL REUS W/TWL XL LVL3 (GOWN DISPOSABLE) ×2 IMPLANT
HANDPIECE INTERPULSE COAX TIP (DISPOSABLE) ×3
HOLDER FOLEY CATH W/STRAP (MISCELLANEOUS) ×2 IMPLANT
HOOD PEEL AWAY FLYTE STAYCOOL (MISCELLANEOUS) ×9 IMPLANT
IMMOBILIZER KNEE 20 (SOFTGOODS) ×3
IMMOBILIZER KNEE 20 THIGH 36 (SOFTGOODS) ×1 IMPLANT
MANIFOLD NEPTUNE II (INSTRUMENTS) ×3 IMPLANT
PACK ICE MAXI GEL EZY WRAP (MISCELLANEOUS) ×3 IMPLANT
PACK TOTAL KNEE CUSTOM (KITS) ×3 IMPLANT
PATELLA STD 34X8.5 (Orthopedic Implant) ×2 IMPLANT
PEG FEMORAL DISTAL (Knees) IMPLANT
PLATE KNEE TIBIAL 71MM FIXED (Plate) ×2 IMPLANT
PROTECTOR NERVE ULNAR (MISCELLANEOUS) ×3 IMPLANT
SET HNDPC FAN SPRY TIP SCT (DISPOSABLE) ×1 IMPLANT
SUT VIC AB 0 CT1 36 (SUTURE) ×3 IMPLANT
SUT VIC AB 2-0 CT1 27 (SUTURE) ×3
SUT VIC AB 2-0 CT1 TAPERPNT 27 (SUTURE) ×1 IMPLANT
SUT VIC AB 3-0 SH 8-18 (SUTURE) ×3 IMPLANT
TRAY FOLEY MTR SLVR 14FR STAT (SET/KITS/TRAYS/PACK) ×2 IMPLANT
WRAP KNEE MAXI GEL POST OP (GAUZE/BANDAGES/DRESSINGS) ×2 IMPLANT

## 2018-05-28 NOTE — Care Plan (Signed)
Ortho Bundle Case Management Note  Patient Details  Name: Rachel Bates MRN: [id number] Date of Birth: 12/06/1945    Spoke with patient. She will discharge to home with family. Her post op therapy will be determined after surgery. IF TKR will need HHPT. Referral to [facility]. She is agreeable to this                  DME Arranged:    DME Agency:     HH Arranged:  PT HH Agency:  [facility] (formerly [facility])  Additional Comments: Please contact me with any questions of if this plan should need to change.  Ladell Heads,  [place] [facility]opaedic Specialist  [phone] 05/28/2018, 8:24 AM

## 2018-05-28 NOTE — Progress Notes (Signed)
Assisted Dr. Turk with right, ultrasound guided, adductor canal block. Side rails up, monitors on throughout procedure. See vital signs in flow sheet. Tolerated Procedure well.  

## 2018-05-28 NOTE — Discharge Instructions (Signed)

## 2018-05-28 NOTE — H&P (Signed)
PREOPERATIVE H&P  Chief Complaint: Right knee pain  HPI: Rachel Bates is a 72 y.o. female who presents for preoperative history and physical with a diagnosis of right knee osteoarthritis. Symptoms are rated as moderate to severe, and have been worsening.  This is significantly impairing activities of daily living.  She has elected for surgical management.   She has failed injections, activity modification, anti-inflammatories, and assistive devices.  Preoperative X-rays demonstrate end stage degenerative changes with osteophyte formation, loss of joint space, subchondral sclerosis.  She has had a previous nonhealing ulcer/skin wound over her leg, which we finally got to heal.  Past Medical History:  Diagnosis Date  . AICD (automatic cardioverter/defibrillator) present dual   [employer] ---  original placedment 2007/  generator change 2014 by dr gregg taylor  . Anemia   . Anticoagulant long-term use    eliquis  . Anxiety   . CAD (coronary artery disease) primary cardiologist-- dr gregg taylor   MI and 2 stents 1995 in [place]  . CHF NYHA class II, chronic, systolic ([place])    followed by dr Carleene Overlie taylor  . Depression   . Dyslipidemia   . History of basal cell carcinoma (BCC) excision    right ankle area s/p  excision in office 06/ 2019  in office  . History of DVT of lower extremity yrs ago before 2012  . History of MI (myocardial infarction) 1995  in [place]  . History of pulmonary embolus (PE) 2012  . History of ventricular tachycardia   . Hypersomnia   . Ischemic cardiomyopathy   . Migraines   . OA (osteoarthritis)    "all over"  . PAF (paroxysmal atrial fibrillation) ([place])   . Primary localized osteoarthritis of right hip 12/18/2017  . S/P coronary artery stent placement 1995   in [place]   05-21-2018 per pt x2  stents in same coronary artery (unsure BM or DES)   Past Surgical History:  Procedure Laterality Date  . CARDIAC DEFIBRILLATOR PLACEMENT  2007  . DILATION AND  CURETTAGE OF UTERUS  yrs ago  . IMPLANTABLE CARDIOVERTER DEFIBRILLATOR GENERATOR CHANGE N/A 02/04/2013   Procedure: IMPLANTABLE CARDIOVERTER DEFIBRILLATOR GENERATOR CHANGE;  Surgeon: Evans Lance, MD;  Location: [facility] CATH LAB;  Service: Cardiovascular;  Laterality: N/A;  . SHOULDER ARTHROSCOPY Right 2015  . TOTAL HIP ARTHROPLASTY Right 12/18/2017   Procedure: RIGHT TOTAL HIP ARTHROPLASTY;  Surgeon: Marchia Bond, MD;  Location: [place];  Service: Orthopedics;  Laterality: Right;  . TUBAL LIGATION Bilateral yrs ago   Social History   Socioeconomic History  . Marital status: Single    Spouse name: Not on file  . Number of children: Not on file  . Years of education: Not on file  . Highest education level: Not on file  Occupational History  . Not on file  Social Needs  . Financial resource strain: Not on file  . Food insecurity:    Worry: Not on file    Inability: Not on file  . Transportation needs:    Medical: Not on file    Non-medical: Not on file  Tobacco Use  . Smoking status: Former Smoker    Years: 30.00    Types: Cigarettes    Last attempt to quit: 05/21/1996    Years since quitting: 22.0  . Smokeless tobacco: Never Used  Substance and Sexual Activity  . Alcohol use: No  . Drug use: Never  . Sexual activity: Not on file  Lifestyle  . Physical activity:  Days per week: Not on file    Minutes per session: Not on file  . Stress: Not on file  Relationships  . Social connections:    Talks on phone: Not on file    Gets together: Not on file    Attends religious service: Not on file    Active member of club or organization: Not on file    Attends meetings of clubs or organizations: Not on file    Relationship status: Not on file  Other Topics Concern  . Not on file  Social History Narrative  . Not on file   Family History  Problem Relation Age of Onset  . Hypertension Mother   . Thyroid disease Mother   . Alzheimer's disease Mother   . Coronary artery disease  Father   . Pulmonary embolism Father   . Congestive Heart Failure Maternal Grandmother   . Hypertension Maternal Grandmother   . Heart attack Maternal Grandfather   . Other Maternal Grandfather        carotid disease  . Dementia Paternal Grandmother   . Other Paternal Grandfather 38       accident   Allergies  Allergen Reactions  . Sumatriptan Succinate Other (See Comments)    Chest pain, no triptans, pt states "it makes my heart race"  . Amitriptyline Other (See Comments)    Weight gain   Prior to Admission medications   Medication Sig Start Date End Date Taking? Authorizing Provider  B Complex-C-Folic Acid (STRESS FORMULA) TABS Take 1 tablet by mouth daily.    Yes [provider]  baclofen (LIORESAL) 10 MG tablet Take 1 tablet (10 mg total) by mouth daily as needed (MIGRAINES). Or for muscle spasm 12/18/17  Yes Marchia Bond, MD  Calcium Carbonate-Vitamin D (CALCIUM-D) 600-400 MG-UNIT TABS Take 1 tablet by mouth daily.    Yes [provider]  carvedilol (COREG) 12.5 MG tablet Take 12.5 mg by mouth 2 (two) times daily with a meal.    Yes [provider]  Cholecalciferol (VITAMIN D-3) 1000 UNITS CAPS Take 1,000 Units by mouth daily.    Yes [provider]  diphenhydrAMINE (BENADRYL) 25 mg capsule Take 25 mg by mouth daily.   Yes [provider]  ELIQUIS 5 MG TABS tablet TAKE 1 TABLET BY MOUTH TWICE A DAY Patient taking differently: Take 5 mg by mouth 2 (two) times daily.  12/28/17  Yes Evans Lance, MD  Enoxaparin Sodium (LOVENOX [place]) Inject into the skin as directed. Per dr Lindi Adie recommendation's in chart :  (1mg /kg) SQ twice daily until the night before surgery)  To start 05-25-2018,   Yes [provider]  FIBER PO Take 1 capsule by mouth daily.   Yes [provider]  Omega-3 Fatty Acids (FISH OIL) 1000 MG CAPS Take 1,000 mg by mouth daily.    Yes [provider]  ramipril (ALTACE) 2.5 MG tablet Take 2.5 mg by  mouth daily.     Yes [provider]  sertraline (ZOLOFT) 100 MG tablet Take 100 mg by mouth at bedtime.   Yes [provider]  simvastatin (ZOCOR) 40 MG tablet Take 40 mg by mouth at bedtime.     Yes [provider]  traZODone (DESYREL) 50 MG tablet Take 25 mg by mouth at bedtime.   Yes [provider]  vitamin E 400 UNIT capsule Take 400 Units by mouth daily.   Yes [provider]  zonisamide (ZONEGRAN) 50 MG capsule Take 50-100  mg by mouth See admin instructions. Take 50mg  by mouth in the morning and 100mg  at night. 09/13/17  Yes [provider]     Positive ROS: All other systems have been reviewed and were otherwise negative with the exception of those mentioned in the HPI and as above.  Physical Exam: General: Alert, no acute distress Cardiovascular: No pedal edema Respiratory: No cyanosis, no use of accessory musculature GI: No organomegaly, abdomen is soft and non-tender Skin: No lesions in the area of chief complaint Neurologic: Sensation intact distally Psychiatric: Patient is competent for consent with normal mood and affect Lymphatic: No axillary or cervical lymphadenopathy  MUSCULOSKELETAL: Right knee range of motion 0 to 115 degrees with crepitance, pain somewhat diffusely, she cannot really localize it very well.  Assessment: Right knee osteoarthritis, worst in the patellofemoral joint, may be some degree of disease laterally.   Plan: Plan for Procedure(s): TOTAL KNEE ARTHROPLASTY, possibly a patellofemoral arthroplasty, however I am suspicious she will need a total knee replacement given the disease that I am seeing on her lateral compartment.  The risks benefits and alternatives were discussed with the patient including but not limited to the risks of nonoperative treatment, versus surgical intervention including infection, bleeding, nerve injury,  blood clots, cardiopulmonary complications, morbidity, mortality,  among others, and they were willing to proceed.   Anticipated LOS equal to or greater than 2 midnights due to - Age 85 and older with one or more of the following:  - Obesity  - Expected need for hospital services (PT, OT, Nursing) required for safe  discharge  - Anticipated need for postoperative skilled nursing care or inpatient rehab  - Active co-morbidities: Heart Attack    Johnny Bridge, MD Cell [phone]   05/28/2018 9:17 AM

## 2018-05-28 NOTE — Anesthesia Preprocedure Evaluation (Addendum)
Anesthesia Evaluation  Patient identified by MRN, date of birth, ID band Patient awake    Reviewed: Allergy & Precautions, NPO status , Patient's Chart, lab work & pertinent test results, reviewed documented beta blocker date and time   Airway Mallampati: II  TM Distance: >3 FB Neck ROM: Full    Dental  (+) Lower Dentures, Upper Dentures   Pulmonary sleep apnea , former smoker,    breath sounds clear to auscultation       Cardiovascular hypertension, Pt. on home beta blockers and Pt. on medications (-) angina+ CAD, + Past MI, + Cardiac Stents and +CHF  + Cardiac Defibrillator  Rhythm:Regular Rate:Normal  ischemic cardiomyopathy, chronic class II systolic heart failure, and ventricular tachycardia   Neuro/Psych  Headaches, PSYCHIATRIC DISORDERS Anxiety Depression    GI/Hepatic negative GI ROS, Neg liver ROS,   Endo/Other  negative endocrine ROS  Renal/GU negative Renal ROS     Musculoskeletal  (+) Arthritis ,   Abdominal   Peds  Hematology  (+) Blood dyscrasia (Eliquis; Therapeutic lovenox bridge), , Thrombocytopenia--Plt 141k   Anesthesia Other Findings   Reproductive/Obstetrics                            Anesthesia Physical  Anesthesia Plan  ASA: III  Anesthesia Plan: General   Post-op Pain Management:  Regional for Post-op pain   Induction: Intravenous  PONV Risk Score and Plan: 3 and Ondansetron, Dexamethasone and Treatment may vary due to age or medical condition  Airway Management Planned: Oral ETT  Additional Equipment:   Intra-op Plan:   Post-operative Plan: Extubation in OR  Informed Consent: I have reviewed the patients History and Physical, chart, labs and discussed the procedure including the risks, benefits and alternatives for the proposed anesthesia with the patient or authorized representative who has indicated his/her understanding and acceptance.   Dental  advisory given  Plan Discussed with: CRNA and Anesthesiologist  Anesthesia Plan Comments:        Anesthesia Quick Evaluation

## 2018-05-28 NOTE — Anesthesia Postprocedure Evaluation (Signed)
Anesthesia Post Note  Patient: Rachel Bates  Procedure(s) Performed: TOTAL KNEE ARTHROPLASTY (Right Knee)     Patient location during evaluation: PACU Anesthesia Type: General Level of consciousness: awake and alert, oriented and awake Pain management: pain level controlled Vital Signs Assessment: post-procedure vital signs reviewed and stable Respiratory status: spontaneous breathing, nonlabored ventilation and respiratory function stable Cardiovascular status: blood pressure returned to baseline and stable Postop Assessment: no apparent nausea or vomiting Anesthetic complications: no    Last Vitals:  Vitals:   05/28/18 1541 05/28/18 1641  BP: 133/65 130/62  Pulse: 71 89  Resp: 16 16  Temp: 36.4 C 36.8 C  SpO2: 97% (!) 87%    Last Pain:  Vitals:   05/28/18 1632  TempSrc:   PainSc: Asleep                 Catalina Gravel

## 2018-05-28 NOTE — Evaluation (Signed)
Physical Therapy Evaluation Patient Details Name: Rachel Bates MRN: [id number] DOB: 03-21-1946 Today's Date: 05/28/2018   History of Present Illness  72 yo female s/p R TKR on 05/28/18. PMH inclues AICD, anxiety, depression, DVT, PE 2012, CAD with history of MI, ischemic cardiomyopathy, migraines, OA, PAF, R THA 12/2017, R shoulder arthroscopy.  Clinical Impression   Pt presents with R knee pain, increased time and effort to perform mobility tasks, and decreased tolerance for ambulation due to pain. Pt to benefit from acute PT to address deficits. Pt ambulated 60 ft with RW with min guard assist. Pt educated on quad sets (5-10/hour), ankle pumps (20/hour), and heel slides (5-10/hour) to perform this afternoon/evening to lessen stiffness and increase circulation, to pt's tolerance and limited by pain. PT to progress mobility as tolerated, and will continue to follow acutely.      Follow Up Recommendations Follow surgeon's recommendation for DC plan and follow-up therapies;Supervision for mobility/OOB(HHPT)    Equipment Recommendations  Rolling walker with 5" wheels    Recommendations for Other Services       Precautions / Restrictions Precautions Precautions: Fall Restrictions Weight Bearing Restrictions: No Other Position/Activity Restrictions: WBAT       Mobility  Bed Mobility Overal bed mobility: Needs Assistance Bed Mobility: Supine to Sit     Supine to sit: Min guard;HOB elevated     General bed mobility comments: Min guard for safety. Verbal cuing for sequencing.   Transfers Overall transfer level: Needs assistance Equipment used: Rolling walker (2 wheeled) Transfers: Sit to/from Stand Sit to Stand: Min guard;From elevated surface         General transfer comment: Min guard for safety. VErbal cuing for hand placement.   Ambulation/Gait Ambulation/Gait assistance: Min guard Gait Distance (Feet): 60 Feet Assistive device: Rolling walker (2 wheeled) Gait  Pattern/deviations: Step-to pattern;Decreased stride length;Decreased weight shift to right Gait velocity: decr    General Gait Details: Min guard for safety. Verbal cuing for sequencing, placement in RW, turning.   Stairs            Wheelchair Mobility    Modified Rankin (Stroke Patients Only)       Balance Overall balance assessment: Mild deficits observed, not formally tested                                           Pertinent Vitals/Pain Pain Assessment: 0-10 Pain Score: 3  Pain Location: R knee  Pain Descriptors / Indicators: Sore Pain Intervention(s): Limited activity within patient's tolerance;Repositioned;Monitored during session    Home Living Family/patient expects to be discharged to:: Private residence Living Arrangements: Alone Available Help at Discharge: Family;Available 24 hours/day(daughter to stay with pt ) Type of Home: House Home Access: Level entry     Home Layout: One level Home Equipment: Shower seat;Cane - single point      Prior Function Level of Independence: Independent with assistive device(s)         Comments: using cane and borrowed RW intermittently due to knee pain.      Hand Dominance   Dominant Hand: Right    Extremity/Trunk Assessment   Upper Extremity Assessment Upper Extremity Assessment: Overall WFL for tasks assessed    Lower Extremity Assessment Lower Extremity Assessment: Overall WFL for tasks assessed;RLE deficits/detail RLE Deficits / Details: suspected post-surgical weakness; able to perform ankle pumps, quad sets, heel slide with assist, SLR  with assist RLE Sensation: WNL    Cervical / Trunk Assessment Cervical / Trunk Assessment: Normal  Communication   Communication: No difficulties  Cognition Arousal/Alertness: Awake/alert Behavior During Therapy: WFL for tasks assessed/performed Overall Cognitive Status: Within Functional Limits for tasks assessed                                         General Comments      Exercises Total Joint Exercises Goniometric ROM: knee aarom ~5-60*, limited by pain    Assessment/Plan    PT Assessment Patient needs continued PT services  PT Problem List Decreased strength;Pain;Decreased range of motion;Decreased activity tolerance;Decreased knowledge of use of DME;Decreased balance;Decreased mobility       PT Treatment Interventions DME instruction;Therapeutic activities;Gait training;Therapeutic exercise;Patient/family education;Balance training;Functional mobility training    PT Goals (Current goals can be found in the Care Plan section)  Acute Rehab PT Goals PT Goal Formulation: With patient Time For Goal Achievement: 06/04/18 Potential to Achieve Goals: Good    Frequency 7X/week   Barriers to discharge        Co-evaluation               AM-PAC PT "6 Clicks" Daily Activity  Outcome Measure Difficulty turning over in bed (including adjusting bedclothes, sheets and blankets)?: Unable Difficulty moving from lying on back to sitting on the side of the bed? : Unable Difficulty sitting down on and standing up from a chair with arms (e.g., wheelchair, bedside commode, etc,.)?: Unable Help needed moving to and from a bed to chair (including a wheelchair)?: A Little Help needed walking in hospital room?: A Little Help needed climbing 3-5 steps with a railing? : A Little 6 Click Score: 12    End of Session Equipment Utilized During Treatment: Gait belt Activity Tolerance: Patient tolerated treatment well Patient left: in chair;with chair alarm set;with call bell/phone within reach;with SCD's reapplied Nurse Communication: Mobility status PT Visit Diagnosis: Other abnormalities of gait and mobility (R26.89);Difficulty in walking, not elsewhere classified (R26.2)    Time: 2197-5883 PT Time Calculation (min) (ACUTE ONLY): 21 min   Charges:   PT Evaluation $PT Eval Low Complexity: 1 Low           Jolyssa Oplinger Conception Chancy, PT Acute Rehabilitation Services Pager [phone]  Office [phone]  Sya Nestler D Elonda Husky 05/28/2018, 7:43 PM

## 2018-05-28 NOTE — Transfer of Care (Signed)
Immediate Anesthesia Transfer of Care Note  Patient: Rachel Bates  Procedure(s) Performed: TOTAL KNEE ARTHROPLASTY (Right Knee)  Patient Location: PACU  Anesthesia Type:General  Level of Consciousness: sedated  Airway & Oxygen Therapy: Patient Spontanous Breathing and Patient connected to face mask oxygen  Post-op Assessment: Report given to RN and Post -op Vital signs reviewed and stable  Post vital signs: Reviewed and stable  Last Vitals:  Vitals Value Taken Time  BP    Temp    Pulse 75 05/28/2018 12:18 PM  Resp 21 05/28/2018 12:18 PM  SpO2 93 % 05/28/2018 12:18 PM  Vitals shown include unvalidated device data.  Last Pain:  Vitals:   05/28/18 0836  TempSrc: Oral      Patients Stated Pain Goal: 4 (78/58/85 0277)  Complications: No apparent anesthesia complications

## 2018-05-28 NOTE — Op Note (Signed)
DATE OF SURGERY:  05/28/2018 TIME: 12:09 PM  PATIENT NAME:  Rachel Bates   AGE: 72 y.o.    PRE-OPERATIVE DIAGNOSIS: Right knee primary localized osteoarthritis, possibly patellofemoral  POST-OPERATIVE DIAGNOSIS: Right knee primary localized osteoarthritis involving the lateral compartment and patellofemoral joint  PROCEDURE: Right total Knee Arthroplasty  SURGEON:  Johnny Bridge, MD   ASSISTANT:  Joya Gaskins, OPA-C, present and scrubbed throughout the case, critical for assistance with exposure, retraction, instrumentation, and closure.   OPERATIVE IMPLANTS: [employer]omet Vanguard Fixed Bearing Posterior Stabilized Femur size 67.5, Tibia size 71, Patella size 34 3-peg oval button, with a 10 mm polyethylene insert.   PREOPERATIVE INDICATIONS:  Rachel Bates is a 72 y.o. year old female with end stage bone on bone degenerative arthritis of the knee who failed conservative treatment, including injections, antiinflammatories, activity modification, and assistive devices, and had significant impairment of their activities of daily living, and elected for Total Knee Arthroplasty.   The risks, benefits, and alternatives were discussed at length including but not limited to the risks of infection, bleeding, nerve injury, stiffness, blood clots, the need for revision surgery, cardiopulmonary complications, among others, and they were willing to proceed.  OPERATIVE FINDINGS AND UNIQUE ASPECTS OF THE CASE: There was severe eburnation with bone loss on the patella.  The patella measured 18 mm before the cut, and 13.5 mm after the cut.  The lateral compartment had eburnation on the tibia, although there was still cartilage on the femur, but the tibia had full-thickness chondral loss centrally, and thus was not amenable to patellofemoral arthroplasty.  I did have to cut the femur twice, first cut was at 9 mm, but was barely across the lateral condyle.  She was much more narrow medial to lateral than  she was front to back, I had to use a 67.5 femur, which caused a slight bit of notching.  I used a rongure to complete the cut, on the lateral side, although ultimately the trial sat down completely flush, but the real implant was still slightly elevated laterally secondary to the notch, but had good cement mantle, and I did not feel that aggressive impaction was appropriate, and would potentially risk fracture, so accepted a slight elevation of the lateral side which I do not think will be clinically relevant.  ESTIMATED BLOOD LOSS: 150 mL  OPERATIVE DESCRIPTION:  The patient was brought to the operative room and placed in a supine position.  General anesthesia was administered.  IV antibiotics were given.  The lower extremity was prepped and draped in the usual sterile fashion.  Time out was performed.  The leg was elevated and exsanguinated and the tourniquet was inflated.  Anterior quadriceps tendon splitting approach was performed.  The patella was everted and osteophytes were removed.  I evaluated the lateral compartment, and elected for total knee replacement based on the chondral loss on the tibia.  The anterior horn of the medial and lateral meniscus was removed.   The distal femur was opened with the drill and the intramedullary distal femoral cutting jig was utilized, set at 5 degrees resecting 9 mm off the distal femur.  Care was taken to protect the collateral ligaments.  Then the extramedullary tibial cutting jig was utilized making the appropriate cut using the anterior tibial crest as a reference building in appropriate posterior slope.  Care was taken during the cut to protect the medial and collateral ligaments.  The proximal tibia was removed along with the posterior horns of  the menisci.  The PCL was sacrificed.    The extensor gap was measured and was still tight, so I resected an additional 2 mm off of the femur, which he brought the gap to approximately 19mm.    The distal  femoral sizing jig was applied, the femur sized to a 70 from front to back, but only a 67.5 at the biggest from medial to lateral.  Then the 4-in-1 cutting jig was applied and the anterior and posterior femur was cut, along with the chamfer cuts.  All posterior osteophytes were removed.  The flexion gap was then measured and was symmetric with the extension gap.  I did have a slight notch laterally, which I tapered with a rondure.  I completed the distal femoral preparation using the appropriate jig to prepare the box.  The patella was then measured, and cut with the saw.  The thickness before the cut was 18 and after the cut was 13.5.  The proximal tibia sized and prepared accordingly with the reamer and the punch, and then all components were trialed with the 9mm poly insert.  The knee was found to have excellent balance and full motion.    The above named components were then cemented into place and all excess cement was removed.  The real polyethylene implant was placed.  After the cement had cured I released the tourniquet and confirmed excellent hemostasis with no major posterior vessel injury.    The knee was easily taken through a range of motion and the patella tracked well and the knee irrigated copiously and the parapatellar and subcutaneous tissue closed with vicryl, and monocryl with steri strips for the skin.  The wounds were injected with marcaine, and dressed with sterile gauze and the patient was awakened and returned to the PACU in stable and satisfactory condition.  There were no complications.  Total tourniquet time was ~100 minutes.

## 2018-05-28 NOTE — Anesthesia Procedure Notes (Signed)
Anesthesia Regional Block: Adductor canal block   Pre-Anesthetic Checklist: ,, timeout performed, Correct Patient, Correct Site, Correct Laterality, Correct Procedure, Correct Position, site marked, Risks and benefits discussed,  Surgical consent,  Pre-op evaluation,  At surgeon's request and post-op pain management  Laterality: Right  Prep: chloraprep       Needles:  Injection technique: Single-shot  Needle Type: Echogenic Needle     Needle Length: 9cm  Needle Gauge: 21     Additional Needles:   Procedures:,,,, ultrasound used (permanent image in chart),,,,  Narrative:  Start time: 05/28/2018 9:05 AM End time: 05/28/2018 9:10 AM Injection made incrementally with aspirations every 5 mL.  Performed by: Personally  Anesthesiologist: Catalina Gravel, MD  Additional Notes: No pain on injection. No increased resistance to injection. Injection made in 5cc increments.  Good needle visualization.  Patient tolerated procedure well.

## 2018-05-28 NOTE — Anesthesia Procedure Notes (Signed)
Procedure Name: Intubation Date/Time: 05/28/2018 9:52 AM Performed by: Glory Buff, CRNA Pre-anesthesia Checklist: Patient identified, Emergency Drugs available, Suction available and Patient being monitored Patient Re-evaluated:Patient Re-evaluated prior to induction Oxygen Delivery Method: Circle system utilized Preoxygenation: Pre-oxygenation with 100% oxygen Induction Type: IV induction Ventilation: Mask ventilation without difficulty Laryngoscope Size: Miller and 3 Grade View: Grade I Tube type: Oral Tube size: 7.0 mm Number of attempts: 1 Airway Equipment and Method: Stylet and Oral airway Placement Confirmation: ETT inserted through vocal cords under direct vision,  positive ETCO2 and breath sounds checked- equal and bilateral Secured at: 22 cm Tube secured with: Tape Dental Injury: Teeth and Oropharynx as per pre-operative assessment

## 2018-05-29 ENCOUNTER — Encounter (HOSPITAL_COMMUNITY): Payer: Self-pay | Admitting: Orthopedic Surgery

## 2018-05-29 DIAGNOSIS — Z96659 Presence of unspecified artificial knee joint: Secondary | ICD-10-CM

## 2018-05-29 DIAGNOSIS — Z95810 Presence of automatic (implantable) cardiac defibrillator: Secondary | ICD-10-CM | POA: Diagnosis not present

## 2018-05-29 DIAGNOSIS — M1711 Unilateral primary osteoarthritis, right knee: Secondary | ICD-10-CM | POA: Diagnosis not present

## 2018-05-29 DIAGNOSIS — F329 Major depressive disorder, single episode, unspecified: Secondary | ICD-10-CM | POA: Diagnosis not present

## 2018-05-29 DIAGNOSIS — F419 Anxiety disorder, unspecified: Secondary | ICD-10-CM | POA: Diagnosis not present

## 2018-05-29 DIAGNOSIS — I2510 Atherosclerotic heart disease of native coronary artery without angina pectoris: Secondary | ICD-10-CM | POA: Diagnosis not present

## 2018-05-29 DIAGNOSIS — Z7901 Long term (current) use of anticoagulants: Secondary | ICD-10-CM | POA: Diagnosis not present

## 2018-05-29 LAB — BASIC METABOLIC PANEL
Anion gap: 6 (ref 5–15)
BUN: 12 mg/dL (ref 8–23)
CHLORIDE: 105 mmol/L (ref 98–111)
CO2: 27 mmol/L (ref 22–32)
Calcium: 8.5 mg/dL — ABNORMAL LOW (ref 8.9–10.3)
Creatinine, Ser: 0.76 mg/dL (ref 0.44–1.00)
GFR calc Af Amer: >60 mL/min (ref >60)
GFR calc non Af Amer: >60 mL/min (ref >60)
GLUCOSE: 120 mg/dL — AB (ref 70–99)
Potassium: 4.5 mmol/L (ref 3.5–5.1)
Sodium: 138 mmol/L (ref 135–145)

## 2018-05-29 LAB — CBC
HCT: 33.9 % — ABNORMAL LOW (ref 36.0–46.0)
HEMOGLOBIN: 10 g/dL — AB (ref 12.0–15.0)
MCH: 24.4 pg — AB (ref 26.0–34.0)
MCHC: 29.5 g/dL — ABNORMAL LOW (ref 30.0–36.0)
MCV: 82.7 fL (ref 80.0–100.0)
NRBC: 0 % (ref 0.0–0.2)
Platelets: 112 10*3/uL — ABNORMAL LOW (ref 150–400)
RBC: 4.1 MIL/uL (ref 3.87–5.11)
RDW: 17.5 % — ABNORMAL HIGH (ref 11.5–15.5)
WBC: 6.9 10*3/uL (ref 4.0–10.5)

## 2018-05-29 NOTE — Care Plan (Signed)
Patient had a R TKR. I have sent [place] referral to [facility]. Patient was converted to Obs and will discharge to home today   Thanks   Ladell Heads, [place]

## 2018-05-29 NOTE — Progress Notes (Signed)
Physical Therapy Treatment Patient Details Name: Rachel Bates MRN: [id number] DOB: [birth year] Today's Date: 05/29/2018    History of Present Illness 72 yo female s/p R TKR on 05/28/18. PMH inclues AICD, anxiety, depression, DVT, PE 2012, CAD with history of MI, ischemic cardiomyopathy, migraines, OA, PAF, R THA 12/2017, R shoulder arthroscopy.    PT Comments    POD # 1 am session Assisted with amb a greater distance and Performed some TE's following HEP handout.  Instructed on proper tech, freq as well as use of ICE.     Follow Up Recommendations  Follow surgeon's recommendation for DC plan and follow-up therapies;Supervision for mobility/OOB     Equipment Recommendations  Rolling walker with 5" wheels    Recommendations for Other Services       Precautions / Restrictions Precautions Precautions: Fall Restrictions Weight Bearing Restrictions: No Other Position/Activity Restrictions: WBAT     Mobility  Bed Mobility               General bed mobility comments: OOB in recliner   Transfers Overall transfer level: Needs assistance Equipment used: Rolling walker (2 wheeled) Transfers: Sit to/from Stand Sit to Stand: Supervision;Min guard         General transfer comment: one VC safety with turns   Ambulation/Gait Ambulation/Gait assistance: Supervision;Min guard Gait Distance (Feet): 75 Feet Assistive device: Rolling walker (2 wheeled) Gait Pattern/deviations: Step-to pattern;Decreased stride length;Decreased weight shift to right     General Gait Details: one VC safety with turns    Chief Strategy Officer    Modified Rankin (Stroke Patients Only)       Balance                                            Cognition Arousal/Alertness: Awake/alert Behavior During Therapy: WFL for tasks assessed/performed Overall Cognitive Status: Within Functional Limits for tasks assessed                                         Exercises   Total Knee Replacement TE's 10 reps B LE ankle pumps 10 reps towel squeezes 10 reps knee presses 10 reps heel slides  10 reps SAQ's 10 reps SLR's 10 reps ABD Followed by ICE    General Comments        Pertinent Vitals/Pain Pain Assessment: No/denies pain Pain Location: R knee  Pain Descriptors / Indicators: Operative site guarding;Sore Pain Intervention(s): Monitored during session;Repositioned;Ice applied    Home Living                      Prior Function            PT Goals (current goals can now be found in the care plan section) Progress towards PT goals: Progressing toward goals    Frequency    7X/week      PT Plan Current plan remains appropriate    Co-evaluation              AM-PAC PT "6 Clicks" Daily Activity  Outcome Measure  Difficulty turning over in bed (including adjusting bedclothes, sheets and blankets)?: A Little Difficulty moving from lying on back to sitting on the side of the  bed? : A Little Difficulty sitting down on and standing up from a chair with arms (e.g., wheelchair, bedside commode, etc,.)?: A Little Help needed moving to and from a bed to chair (including a wheelchair)?: A Little Help needed walking in hospital room?: A Little Help needed climbing 3-5 steps with a railing? : A Little 6 Click Score: 18    End of Session Equipment Utilized During Treatment: Gait belt Activity Tolerance: Patient tolerated treatment well Patient left: in chair;with chair alarm set;with call bell/phone within reach;with SCD's reapplied Nurse Communication: Mobility status PT Visit Diagnosis: Other abnormalities of gait and mobility (R26.89);Difficulty in walking, not elsewhere classified (R26.2)     Time: 9735-3299 PT Time Calculation (min) (ACUTE ONLY): 28 min  Charges:  $Gait Training: 8-22 mins $Therapeutic Exercise: 8-22 mins                     Rica Koyanagi  PTA Acute   Rehabilitation Services Pager      [phone] Office      [phone]

## 2018-05-29 NOTE — Progress Notes (Signed)
Patient discharged to home with family. Given all belongings, instructions, equipment. Patient verbalized understanding of all instructions. Escorted to pov via w/c.

## 2018-05-29 NOTE — Plan of Care (Signed)

## 2018-05-29 NOTE — Discharge Summary (Signed)
Physician Discharge Summary  Patient ID: Rachel Bates MRN: [id number] DOB/AGE: 08/08/1945 72 y.o.  Admit date: 05/28/2018 Discharge date: 05/29/2018  Admission Diagnoses:  Primary localized osteoarthritis of right knee  Discharge Diagnoses:  Principal Problem:   Primary localized osteoarthritis of right knee Active Problems:   S/P knee replacement   S/P total knee arthroplasty   Past Medical History:  Diagnosis Date  . AICD (automatic cardioverter/defibrillator) present dual   Medtronic ---  original placedment 2007/  generator change 2014 by dr gregg taylor  . Anemia   . Anticoagulant long-term use    eliquis  . Anxiety   . CAD (coronary artery disease) primary cardiologist-- dr gregg taylor   MI and 2 stents 1995 in [place]  . CHF NYHA class II, chronic, systolic ([place])    followed by dr Carleene Overlie taylor  . Depression   . Dyslipidemia   . History of basal cell carcinoma (BCC) excision    right ankle area s/p  excision in office 06/ 2019  in office  . History of DVT of lower extremity yrs ago before 2012  . History of MI (myocardial infarction) 1995  in [place]  . History of pulmonary embolus (PE) 2012  . History of ventricular tachycardia   . Hypersomnia   . Ischemic cardiomyopathy   . Migraines   . OA (osteoarthritis)    "all over"  . PAF (paroxysmal atrial fibrillation) ([place])   . Primary localized osteoarthritis of right hip 12/18/2017  . Primary localized osteoarthritis of right knee 05/28/2018  . S/P coronary artery stent placement 1995   in [place]   05-21-2018 per pt x2  stents in same coronary artery (unsure BM or DES)    Surgeries: Procedure(s): TOTAL KNEE ARTHROPLASTY on 05/28/2018   Consultants (if any):   Discharged Condition: Improved  Hospital Course: Rachel Bates is an 72 y.o. female who was admitted 05/28/2018 with a diagnosis of Primary localized osteoarthritis of right knee and went to the operating room on 05/28/2018 and underwent the above named  procedures.    She was given perioperative antibiotics:  Anti-infectives (From admission, onward)   Start     Dose/Rate Route Frequency Ordered Stop   05/28/18 1600  ceFAZolin (ANCEF) IVPB 2g/100 mL premix     2 g 200 mL/hr over 30 Minutes Intravenous Every 6 hours 05/28/18 1511 05/28/18 2325   05/28/18 0845  ceFAZolin (ANCEF) IVPB 2g/100 mL premix     2 g 200 mL/hr over 30 Minutes Intravenous On call to O.R. 05/28/18 0831 05/28/18 1023    .  She was given sequential compression devices, early ambulation, and eliquisfor DVT prophylaxis.  She benefited maximally from the hospital stay and there were no complications.    Recent vital signs:  Vitals:   05/29/18 0100 05/29/18 0506  BP: 121/67 106/64  Pulse: 84 72  Resp: 14 18  Temp: 97.8 F (36.6 C) 97.8 F (36.6 C)  SpO2: 98% 100%    Recent laboratory studies:  Lab Results  Component Value Date   HGB 10.0 (L) 05/29/2018   HGB 12.6 05/21/2018   HGB 9.0 (L) 12/20/2017   Lab Results  Component Value Date   WBC 6.9 05/29/2018   PLT 112 (L) 05/29/2018   Lab Results  Component Value Date   INR 1.01 12/18/2017   Lab Results  Component Value Date   NA 138 05/29/2018   K 4.5 05/29/2018   CL 105 05/29/2018   CO2 27 05/29/2018  BUN 12 05/29/2018   CREATININE 0.76 05/29/2018   GLUCOSE 120 (H) 05/29/2018    Discharge Medications:   Allergies as of 05/29/2018      Reactions   Sumatriptan Succinate Other (See Comments)   Chest pain, no triptans, pt states "it makes my heart race"   Amitriptyline Other (See Comments)   Weight gain      Medication List    STOP taking these medications   LOVENOX [place]     TAKE these medications   baclofen 10 MG tablet Commonly known as:  LIORESAL Take 1 tablet (10 mg total) by mouth daily as needed (MIGRAINES). Or for muscle spasm What changed:  Another medication with the same name was added. Make sure you understand how and when to take each.   baclofen 10 MG tablet Commonly  known as:  LIORESAL Take 1 tablet (10 mg total) by mouth 3 (three) times daily. As needed for muscle spasm What changed:  You were already taking a medication with the same name, and this prescription was added. Make sure you understand how and when to take each.   Calcium-D 600-400 MG-UNIT Tabs Take 1 tablet by mouth daily.   carvedilol 12.5 MG tablet Commonly known as:  COREG Take 12.5 mg by mouth 2 (two) times daily with a meal.   diphenhydrAMINE 25 mg capsule Commonly known as:  BENADRYL Take 25 mg by mouth daily.   ELIQUIS 5 MG Tabs tablet Generic drug:  apixaban TAKE 1 TABLET BY MOUTH TWICE A DAY What changed:  how much to take   FIBER PO Take 1 capsule by mouth daily.   Fish Oil 1000 MG Caps Take 1,000 mg by mouth daily.   HYDROcodone-acetaminophen 10-325 MG tablet Commonly known as:  NORCO Take 1 tablet by mouth every 6 (six) hours as needed.   ondansetron 4 MG tablet Commonly known as:  ZOFRAN Take 1 tablet (4 mg total) by mouth every 8 (eight) hours as needed for nausea or vomiting.   ramipril 2.5 MG tablet Commonly known as:  ALTACE Take 2.5 mg by mouth daily.   sennosides-docusate sodium 8.6-50 MG tablet Commonly known as:  SENOKOT-S Take 2 tablets by mouth daily.   sertraline 100 MG tablet Commonly known as:  ZOLOFT Take 100 mg by mouth at bedtime.   simvastatin 40 MG tablet Commonly known as:  ZOCOR Take 40 mg by mouth at bedtime.   STRESS FORMULA Tabs Take 1 tablet by mouth daily.   traZODone 50 MG tablet Commonly known as:  DESYREL Take 25 mg by mouth at bedtime.   Vitamin D-3 25 MCG (1000 UT) Caps Take 1,000 Units by mouth daily.   vitamin E 400 UNIT capsule Take 400 Units by mouth daily.   zonisamide 50 MG capsule Commonly known as:  ZONEGRAN Take 50-100 mg by mouth See admin instructions. Take 50mg  by mouth in the morning and 100mg  at night.       Diagnostic Studies: Dg Knee Right Port  Result Date: 05/28/2018 CLINICAL DATA:   72 year old female post knee replacement. Initial encounter. EXAM: PORTABLE RIGHT KNEE - 1-2 VIEW COMPARISON:  None. FINDINGS: Post total right knee replacement which appears in satisfactory position. Joint effusion. IMPRESSION: Post total right knee replacement. Electronically Signed   By: Genia Del M.D.   On: 05/28/2018 12:40    Disposition:     Follow-up Information    Marchia Bond, MD. Go on 06/10/2018.   Specialty:  Orthopedic Surgery Why:  Your appointment  has been made for [employer] information: [address] [place] [address] [phone]        Marchia Bond, MD. Schedule an appointment as soon as possible for a visit in 2 weeks.   Specialty:  Orthopedic Surgery Contact information: [address] [place] [address] [phone]            Signed: Johnny Bridge 05/29/2018, 7:52 AM

## 2018-05-29 NOTE — Care Management CC44 (Signed)
Condition Code 44 Documentation Completed  Patient Details  Name: Rachel Bates MRN: [id number] Date of Birth: 12/01/1945   Condition Code 44 given:  Yes Patient signature on Condition Code 44 notice:  Yes Documentation of 2 MD's agreement:  Yes Code 44 added to claim:  Yes    Guadalupe Maple, RN 05/29/2018, 11:01 AM

## 2018-05-29 NOTE — Care Management Obs Status (Signed)
[place] NOTIFICATION   Patient Details  Name: Rachel Bates MRN: [id number] Date of Birth: 03/10/1946   Medicare Observation Status Notification Given:  Yes    Guadalupe Maple, RN 05/29/2018, 11:01 AM

## 2018-05-30 DIAGNOSIS — I2510 Atherosclerotic heart disease of native coronary artery without angina pectoris: Secondary | ICD-10-CM | POA: Diagnosis not present

## 2018-05-30 DIAGNOSIS — I472 Ventricular tachycardia: Secondary | ICD-10-CM | POA: Diagnosis not present

## 2018-05-30 DIAGNOSIS — Z85828 Personal history of other malignant neoplasm of skin: Secondary | ICD-10-CM | POA: Diagnosis not present

## 2018-05-30 DIAGNOSIS — E785 Hyperlipidemia, unspecified: Secondary | ICD-10-CM | POA: Diagnosis not present

## 2018-05-30 DIAGNOSIS — F329 Major depressive disorder, single episode, unspecified: Secondary | ICD-10-CM | POA: Diagnosis not present

## 2018-05-30 DIAGNOSIS — D649 Anemia, unspecified: Secondary | ICD-10-CM | POA: Diagnosis not present

## 2018-05-30 DIAGNOSIS — Z96641 Presence of right artificial hip joint: Secondary | ICD-10-CM | POA: Diagnosis not present

## 2018-05-30 DIAGNOSIS — I255 Ischemic cardiomyopathy: Secondary | ICD-10-CM | POA: Diagnosis not present

## 2018-05-30 DIAGNOSIS — Z96651 Presence of right artificial knee joint: Secondary | ICD-10-CM | POA: Diagnosis not present

## 2018-05-30 DIAGNOSIS — Z7901 Long term (current) use of anticoagulants: Secondary | ICD-10-CM | POA: Diagnosis not present

## 2018-05-30 DIAGNOSIS — F419 Anxiety disorder, unspecified: Secondary | ICD-10-CM | POA: Diagnosis not present

## 2018-05-30 DIAGNOSIS — I252 Old myocardial infarction: Secondary | ICD-10-CM | POA: Diagnosis not present

## 2018-05-30 DIAGNOSIS — Z9181 History of falling: Secondary | ICD-10-CM | POA: Diagnosis not present

## 2018-05-30 DIAGNOSIS — Z87891 Personal history of nicotine dependence: Secondary | ICD-10-CM | POA: Diagnosis not present

## 2018-05-30 DIAGNOSIS — Z955 Presence of coronary angioplasty implant and graft: Secondary | ICD-10-CM | POA: Diagnosis not present

## 2018-05-30 DIAGNOSIS — Z95810 Presence of automatic (implantable) cardiac defibrillator: Secondary | ICD-10-CM | POA: Diagnosis not present

## 2018-05-30 DIAGNOSIS — Z86711 Personal history of pulmonary embolism: Secondary | ICD-10-CM | POA: Diagnosis not present

## 2018-05-30 DIAGNOSIS — I5022 Chronic systolic (congestive) heart failure: Secondary | ICD-10-CM | POA: Diagnosis not present

## 2018-05-30 DIAGNOSIS — Z471 Aftercare following joint replacement surgery: Secondary | ICD-10-CM | POA: Diagnosis not present

## 2018-05-30 DIAGNOSIS — Z86718 Personal history of other venous thrombosis and embolism: Secondary | ICD-10-CM | POA: Diagnosis not present

## 2018-05-30 DIAGNOSIS — G43909 Migraine, unspecified, not intractable, without status migrainosus: Secondary | ICD-10-CM | POA: Diagnosis not present

## 2018-05-30 DIAGNOSIS — I480 Paroxysmal atrial fibrillation: Secondary | ICD-10-CM | POA: Diagnosis not present

## 2018-05-31 DIAGNOSIS — I252 Old myocardial infarction: Secondary | ICD-10-CM | POA: Diagnosis not present

## 2018-05-31 DIAGNOSIS — I5022 Chronic systolic (congestive) heart failure: Secondary | ICD-10-CM | POA: Diagnosis not present

## 2018-05-31 DIAGNOSIS — I480 Paroxysmal atrial fibrillation: Secondary | ICD-10-CM | POA: Diagnosis not present

## 2018-05-31 DIAGNOSIS — I255 Ischemic cardiomyopathy: Secondary | ICD-10-CM | POA: Diagnosis not present

## 2018-05-31 DIAGNOSIS — I2510 Atherosclerotic heart disease of native coronary artery without angina pectoris: Secondary | ICD-10-CM | POA: Diagnosis not present

## 2018-05-31 DIAGNOSIS — Z471 Aftercare following joint replacement surgery: Secondary | ICD-10-CM | POA: Diagnosis not present

## 2018-06-01 DIAGNOSIS — I255 Ischemic cardiomyopathy: Secondary | ICD-10-CM | POA: Diagnosis not present

## 2018-06-01 DIAGNOSIS — Z471 Aftercare following joint replacement surgery: Secondary | ICD-10-CM | POA: Diagnosis not present

## 2018-06-01 DIAGNOSIS — I5022 Chronic systolic (congestive) heart failure: Secondary | ICD-10-CM | POA: Diagnosis not present

## 2018-06-01 DIAGNOSIS — I2510 Atherosclerotic heart disease of native coronary artery without angina pectoris: Secondary | ICD-10-CM | POA: Diagnosis not present

## 2018-06-01 DIAGNOSIS — I252 Old myocardial infarction: Secondary | ICD-10-CM | POA: Diagnosis not present

## 2018-06-01 DIAGNOSIS — I480 Paroxysmal atrial fibrillation: Secondary | ICD-10-CM | POA: Diagnosis not present

## 2018-06-03 DIAGNOSIS — I5022 Chronic systolic (congestive) heart failure: Secondary | ICD-10-CM | POA: Diagnosis not present

## 2018-06-03 DIAGNOSIS — I255 Ischemic cardiomyopathy: Secondary | ICD-10-CM | POA: Diagnosis not present

## 2018-06-03 DIAGNOSIS — Z471 Aftercare following joint replacement surgery: Secondary | ICD-10-CM | POA: Diagnosis not present

## 2018-06-03 DIAGNOSIS — I480 Paroxysmal atrial fibrillation: Secondary | ICD-10-CM | POA: Diagnosis not present

## 2018-06-03 DIAGNOSIS — I252 Old myocardial infarction: Secondary | ICD-10-CM | POA: Diagnosis not present

## 2018-06-03 DIAGNOSIS — I2510 Atherosclerotic heart disease of native coronary artery without angina pectoris: Secondary | ICD-10-CM | POA: Diagnosis not present

## 2018-06-07 DIAGNOSIS — Z471 Aftercare following joint replacement surgery: Secondary | ICD-10-CM | POA: Diagnosis not present

## 2018-06-07 DIAGNOSIS — I5022 Chronic systolic (congestive) heart failure: Secondary | ICD-10-CM | POA: Diagnosis not present

## 2018-06-07 DIAGNOSIS — I2510 Atherosclerotic heart disease of native coronary artery without angina pectoris: Secondary | ICD-10-CM | POA: Diagnosis not present

## 2018-06-07 DIAGNOSIS — I255 Ischemic cardiomyopathy: Secondary | ICD-10-CM | POA: Diagnosis not present

## 2018-06-07 DIAGNOSIS — I252 Old myocardial infarction: Secondary | ICD-10-CM | POA: Diagnosis not present

## 2018-06-07 DIAGNOSIS — I480 Paroxysmal atrial fibrillation: Secondary | ICD-10-CM | POA: Diagnosis not present

## 2018-06-10 DIAGNOSIS — M1711 Unilateral primary osteoarthritis, right knee: Secondary | ICD-10-CM | POA: Diagnosis not present

## 2018-06-11 ENCOUNTER — Ambulatory Visit (INDEPENDENT_AMBULATORY_CARE_PROVIDER_SITE_OTHER): Payer: Medicare Other

## 2018-06-11 DIAGNOSIS — I472 Ventricular tachycardia, unspecified: Secondary | ICD-10-CM

## 2018-06-11 NOTE — Progress Notes (Signed)
Remote ICD transmission.   

## 2018-06-12 DIAGNOSIS — R262 Difficulty in walking, not elsewhere classified: Secondary | ICD-10-CM | POA: Diagnosis not present

## 2018-06-12 DIAGNOSIS — M25561 Pain in right knee: Secondary | ICD-10-CM | POA: Diagnosis not present

## 2018-06-17 DIAGNOSIS — M1711 Unilateral primary osteoarthritis, right knee: Secondary | ICD-10-CM | POA: Diagnosis not present

## 2018-06-18 ENCOUNTER — Encounter: Payer: Self-pay | Admitting: Cardiology

## 2018-06-19 DIAGNOSIS — M25561 Pain in right knee: Secondary | ICD-10-CM | POA: Diagnosis not present

## 2018-06-19 DIAGNOSIS — R262 Difficulty in walking, not elsewhere classified: Secondary | ICD-10-CM | POA: Diagnosis not present

## 2018-06-26 DIAGNOSIS — R262 Difficulty in walking, not elsewhere classified: Secondary | ICD-10-CM | POA: Diagnosis not present

## 2018-06-26 DIAGNOSIS — M1711 Unilateral primary osteoarthritis, right knee: Secondary | ICD-10-CM | POA: Diagnosis not present

## 2018-06-26 DIAGNOSIS — M25561 Pain in right knee: Secondary | ICD-10-CM | POA: Diagnosis not present

## 2018-06-28 DIAGNOSIS — M791 Myalgia, unspecified site: Secondary | ICD-10-CM | POA: Diagnosis not present

## 2018-06-28 DIAGNOSIS — G43839 Menstrual migraine, intractable, without status migrainosus: Secondary | ICD-10-CM | POA: Diagnosis not present

## 2018-06-28 DIAGNOSIS — M542 Cervicalgia: Secondary | ICD-10-CM | POA: Diagnosis not present

## 2018-06-28 DIAGNOSIS — G518 Other disorders of facial nerve: Secondary | ICD-10-CM | POA: Diagnosis not present

## 2018-06-28 DIAGNOSIS — R51 Headache: Secondary | ICD-10-CM | POA: Diagnosis not present

## 2018-06-28 DIAGNOSIS — G43719 Chronic migraine without aura, intractable, without status migrainosus: Secondary | ICD-10-CM | POA: Diagnosis not present

## 2018-07-01 DIAGNOSIS — R262 Difficulty in walking, not elsewhere classified: Secondary | ICD-10-CM | POA: Diagnosis not present

## 2018-07-01 DIAGNOSIS — M25561 Pain in right knee: Secondary | ICD-10-CM | POA: Diagnosis not present

## 2018-07-12 LAB — CUP PACEART REMOTE DEVICE CHECK
Battery Remaining Longevity: 53 mo
Battery Voltage: 2.98 V
Brady Statistic AP VP Percent: 0 %
Brady Statistic AP VS Percent: 3.06 %
Brady Statistic AS VP Percent: 0.03 %
Brady Statistic AS VS Percent: 96.9 %
Brady Statistic RV Percent Paced: 0.04 %
Date Time Interrogation Session: 20191203141421
HIGH POWER IMPEDANCE MEASURED VALUE: 64 Ohm
HighPow Impedance: 48 Ohm
Implantable Lead Implant Date: 20071003
Implantable Lead Location: 753859
Implantable Pulse Generator Implant Date: 20140729
Lead Channel Impedance Value: 399 Ohm
Lead Channel Impedance Value: 456 Ohm
Lead Channel Pacing Threshold Amplitude: 0.5 V
Lead Channel Pacing Threshold Amplitude: 1 V
Lead Channel Pacing Threshold Pulse Width: 0.4 ms
Lead Channel Pacing Threshold Pulse Width: 0.4 ms
Lead Channel Sensing Intrinsic Amplitude: 8.125 mV
Lead Channel Sensing Intrinsic Amplitude: 8.125 mV
Lead Channel Setting Pacing Amplitude: 2 V
MDC IDC LEAD IMPLANT DT: 20071019
MDC IDC LEAD LOCATION: 753860
MDC IDC MSMT LEADCHNL RA SENSING INTR AMPL: 3.625 mV
MDC IDC MSMT LEADCHNL RA SENSING INTR AMPL: 3.625 mV
MDC IDC MSMT LEADCHNL RV IMPEDANCE VALUE: 285 Ohm
MDC IDC SET LEADCHNL RV PACING AMPLITUDE: 2.5 V
MDC IDC SET LEADCHNL RV PACING PULSEWIDTH: 0.4 ms
MDC IDC SET LEADCHNL RV SENSING SENSITIVITY: 0.3 mV
MDC IDC STAT BRADY RA PERCENT PACED: 3.06 %

## 2018-07-15 DIAGNOSIS — M25561 Pain in right knee: Secondary | ICD-10-CM | POA: Diagnosis not present

## 2018-07-15 DIAGNOSIS — R262 Difficulty in walking, not elsewhere classified: Secondary | ICD-10-CM | POA: Diagnosis not present

## 2018-07-17 DIAGNOSIS — M25561 Pain in right knee: Secondary | ICD-10-CM | POA: Diagnosis not present

## 2018-07-17 DIAGNOSIS — R262 Difficulty in walking, not elsewhere classified: Secondary | ICD-10-CM | POA: Diagnosis not present

## 2018-07-23 DIAGNOSIS — R262 Difficulty in walking, not elsewhere classified: Secondary | ICD-10-CM | POA: Diagnosis not present

## 2018-07-23 DIAGNOSIS — M25561 Pain in right knee: Secondary | ICD-10-CM | POA: Diagnosis not present

## 2018-07-25 DIAGNOSIS — M25561 Pain in right knee: Secondary | ICD-10-CM | POA: Diagnosis not present

## 2018-07-25 DIAGNOSIS — R262 Difficulty in walking, not elsewhere classified: Secondary | ICD-10-CM | POA: Diagnosis not present

## 2018-07-26 DIAGNOSIS — M1711 Unilateral primary osteoarthritis, right knee: Secondary | ICD-10-CM | POA: Diagnosis not present

## 2018-07-29 DIAGNOSIS — M25561 Pain in right knee: Secondary | ICD-10-CM | POA: Diagnosis not present

## 2018-07-29 DIAGNOSIS — R262 Difficulty in walking, not elsewhere classified: Secondary | ICD-10-CM | POA: Diagnosis not present

## 2018-08-06 DIAGNOSIS — Z1331 Encounter for screening for depression: Secondary | ICD-10-CM | POA: Diagnosis not present

## 2018-08-06 DIAGNOSIS — Z1231 Encounter for screening mammogram for malignant neoplasm of breast: Secondary | ICD-10-CM | POA: Diagnosis not present

## 2018-08-06 DIAGNOSIS — Z0000 Encounter for general adult medical examination without abnormal findings: Secondary | ICD-10-CM | POA: Diagnosis not present

## 2018-08-06 DIAGNOSIS — R262 Difficulty in walking, not elsewhere classified: Secondary | ICD-10-CM | POA: Diagnosis not present

## 2018-08-06 DIAGNOSIS — E785 Hyperlipidemia, unspecified: Secondary | ICD-10-CM | POA: Diagnosis not present

## 2018-08-06 DIAGNOSIS — Z1211 Encounter for screening for malignant neoplasm of colon: Secondary | ICD-10-CM | POA: Diagnosis not present

## 2018-08-06 DIAGNOSIS — Z9181 History of falling: Secondary | ICD-10-CM | POA: Diagnosis not present

## 2018-08-06 DIAGNOSIS — M25561 Pain in right knee: Secondary | ICD-10-CM | POA: Diagnosis not present

## 2018-08-08 DIAGNOSIS — M25561 Pain in right knee: Secondary | ICD-10-CM | POA: Diagnosis not present

## 2018-08-08 DIAGNOSIS — R262 Difficulty in walking, not elsewhere classified: Secondary | ICD-10-CM | POA: Diagnosis not present

## 2018-08-14 DIAGNOSIS — R262 Difficulty in walking, not elsewhere classified: Secondary | ICD-10-CM | POA: Diagnosis not present

## 2018-08-14 DIAGNOSIS — M25561 Pain in right knee: Secondary | ICD-10-CM | POA: Diagnosis not present

## 2018-08-20 DIAGNOSIS — M25561 Pain in right knee: Secondary | ICD-10-CM | POA: Diagnosis not present

## 2018-08-20 DIAGNOSIS — R262 Difficulty in walking, not elsewhere classified: Secondary | ICD-10-CM | POA: Diagnosis not present

## 2018-08-22 DIAGNOSIS — M25561 Pain in right knee: Secondary | ICD-10-CM | POA: Diagnosis not present

## 2018-08-22 DIAGNOSIS — R262 Difficulty in walking, not elsewhere classified: Secondary | ICD-10-CM | POA: Diagnosis not present

## 2018-08-25 ENCOUNTER — Other Ambulatory Visit: Payer: Self-pay | Admitting: Internal Medicine

## 2018-08-27 DIAGNOSIS — G518 Other disorders of facial nerve: Secondary | ICD-10-CM | POA: Diagnosis not present

## 2018-08-27 DIAGNOSIS — G43839 Menstrual migraine, intractable, without status migrainosus: Secondary | ICD-10-CM | POA: Diagnosis not present

## 2018-08-27 DIAGNOSIS — M791 Myalgia, unspecified site: Secondary | ICD-10-CM | POA: Diagnosis not present

## 2018-08-27 DIAGNOSIS — M542 Cervicalgia: Secondary | ICD-10-CM | POA: Diagnosis not present

## 2018-08-27 DIAGNOSIS — G43719 Chronic migraine without aura, intractable, without status migrainosus: Secondary | ICD-10-CM | POA: Diagnosis not present

## 2018-08-29 ENCOUNTER — Encounter: Payer: Self-pay | Admitting: Gastroenterology

## 2018-09-10 ENCOUNTER — Ambulatory Visit (INDEPENDENT_AMBULATORY_CARE_PROVIDER_SITE_OTHER): Payer: Medicare Other | Admitting: *Deleted

## 2018-09-10 DIAGNOSIS — I472 Ventricular tachycardia, unspecified: Secondary | ICD-10-CM

## 2018-09-11 LAB — CUP PACEART REMOTE DEVICE CHECK
Battery Remaining Longevity: 49 mo
Battery Voltage: 2.97 V
Brady Statistic RV Percent Paced: 0.03 %
HIGH POWER IMPEDANCE MEASURED VALUE: 49 Ohm
HIGH POWER IMPEDANCE MEASURED VALUE: 63 Ohm
Implantable Lead Implant Date: 20071003
Implantable Lead Location: 753860
Implantable Pulse Generator Implant Date: 20140729
Lead Channel Impedance Value: 342 Ohm
Lead Channel Impedance Value: 399 Ohm
Lead Channel Impedance Value: 418 Ohm
Lead Channel Pacing Threshold Amplitude: 0.375 V
Lead Channel Pacing Threshold Amplitude: 0.875 V
Lead Channel Pacing Threshold Pulse Width: 0.4 ms
Lead Channel Sensing Intrinsic Amplitude: 2.875 mV
Lead Channel Sensing Intrinsic Amplitude: 8.5 mV
Lead Channel Setting Pacing Amplitude: 2.5 V
Lead Channel Setting Sensing Sensitivity: 0.3 mV
MDC IDC LEAD IMPLANT DT: 20071019
MDC IDC LEAD LOCATION: 753859
MDC IDC MSMT LEADCHNL RA SENSING INTR AMPL: 2.875 mV
MDC IDC MSMT LEADCHNL RV PACING THRESHOLD PULSEWIDTH: 0.4 ms
MDC IDC MSMT LEADCHNL RV SENSING INTR AMPL: 8.5 mV
MDC IDC SESS DTM: 20200303173827
MDC IDC SET LEADCHNL RA PACING AMPLITUDE: 2 V
MDC IDC SET LEADCHNL RV PACING PULSEWIDTH: 0.4 ms
MDC IDC STAT BRADY AP VP PERCENT: 0 %
MDC IDC STAT BRADY AP VS PERCENT: 2.59 %
MDC IDC STAT BRADY AS VP PERCENT: 0.03 %
MDC IDC STAT BRADY AS VS PERCENT: 97.37 %
MDC IDC STAT BRADY RA PERCENT PACED: 2.59 %

## 2018-09-17 ENCOUNTER — Encounter: Payer: Self-pay | Admitting: Cardiology

## 2018-09-17 NOTE — Progress Notes (Signed)
Remote ICD transmission.   

## 2018-09-30 DIAGNOSIS — L821 Other seborrheic keratosis: Secondary | ICD-10-CM | POA: Diagnosis not present

## 2018-09-30 DIAGNOSIS — R739 Hyperglycemia, unspecified: Secondary | ICD-10-CM | POA: Diagnosis not present

## 2018-09-30 DIAGNOSIS — D1801 Hemangioma of skin and subcutaneous tissue: Secondary | ICD-10-CM | POA: Diagnosis not present

## 2018-09-30 DIAGNOSIS — C44712 Basal cell carcinoma of skin of right lower limb, including hip: Secondary | ICD-10-CM | POA: Diagnosis not present

## 2018-09-30 DIAGNOSIS — I10 Essential (primary) hypertension: Secondary | ICD-10-CM | POA: Diagnosis not present

## 2018-09-30 DIAGNOSIS — E782 Mixed hyperlipidemia: Secondary | ICD-10-CM | POA: Diagnosis not present

## 2018-09-30 DIAGNOSIS — F339 Major depressive disorder, recurrent, unspecified: Secondary | ICD-10-CM | POA: Diagnosis not present

## 2018-10-02 DIAGNOSIS — M1711 Unilateral primary osteoarthritis, right knee: Secondary | ICD-10-CM | POA: Diagnosis not present

## 2018-10-10 ENCOUNTER — Ambulatory Visit: Payer: Medicare Other | Admitting: Gastroenterology

## 2018-12-10 ENCOUNTER — Ambulatory Visit (INDEPENDENT_AMBULATORY_CARE_PROVIDER_SITE_OTHER): Payer: Medicare Other | Admitting: *Deleted

## 2018-12-10 DIAGNOSIS — I472 Ventricular tachycardia, unspecified: Secondary | ICD-10-CM

## 2018-12-10 LAB — CUP PACEART REMOTE DEVICE CHECK
Battery Remaining Longevity: 45 mo
Battery Voltage: 2.97 V
Brady Statistic AP VP Percent: 0 %
Brady Statistic AP VS Percent: 3.93 %
Brady Statistic AS VP Percent: 0.03 %
Brady Statistic AS VS Percent: 96.04 %
Brady Statistic RA Percent Paced: 3.92 %
Brady Statistic RV Percent Paced: 0.04 %
Date Time Interrogation Session: 20200602124232
HighPow Impedance: 60 Ohm
HighPow Impedance: 73 Ohm
Implantable Lead Implant Date: 20071003
Implantable Lead Implant Date: 20071019
Implantable Lead Location: 753859
Implantable Lead Location: 753860
Implantable Lead Model: 5076
Implantable Lead Model: 6947
Implantable Pulse Generator Implant Date: 20140729
Lead Channel Impedance Value: 342 Ohm
Lead Channel Impedance Value: 399 Ohm
Lead Channel Impedance Value: 475 Ohm
Lead Channel Pacing Threshold Amplitude: 0.375 V
Lead Channel Pacing Threshold Amplitude: 1 V
Lead Channel Pacing Threshold Pulse Width: 0.4 ms
Lead Channel Pacing Threshold Pulse Width: 0.4 ms
Lead Channel Sensing Intrinsic Amplitude: 2.75 mV
Lead Channel Sensing Intrinsic Amplitude: 2.75 mV
Lead Channel Sensing Intrinsic Amplitude: 8.75 mV
Lead Channel Sensing Intrinsic Amplitude: 8.75 mV
Lead Channel Setting Pacing Amplitude: 2 V
Lead Channel Setting Pacing Amplitude: 2.5 V
Lead Channel Setting Pacing Pulse Width: 0.4 ms
Lead Channel Setting Sensing Sensitivity: 0.3 mV

## 2018-12-18 ENCOUNTER — Encounter: Payer: Self-pay | Admitting: Cardiology

## 2018-12-18 NOTE — Progress Notes (Signed)
Remote ICD transmission.   

## 2019-02-03 ENCOUNTER — Telehealth: Payer: Self-pay | Admitting: Internal Medicine

## 2019-02-03 NOTE — Telephone Encounter (Signed)
New Message         COVID-19 Pre-Screening Questions:   In the past 7 to 10 days have you had a cough,  shortness of breath, headache, congestion, fever (100 or greater) body aches, chills, sore throat, or sudden loss of taste or sense of smell? Headache from history of migraines   Have you been around anyone with known Covid 19. NO  Have you been around anyone who is awaiting Covid 19 test results in the past 7 to 10 days? NO  Have you been around anyone who has been exposed to Covid 19, or has mentioned symptoms of Covid 19 within the past 7 to 10 days? NO  If you have any concerns/questions about symptoms patients report during screening (either on the phone or at threshold). Contact the provider seeing the patient or DOD for further guidance.  If neither are available contact a member of the leadership team.

## 2019-02-03 NOTE — Telephone Encounter (Signed)
Returned call to Pt.  Advised she was fine for appt tomorrow.

## 2019-02-04 ENCOUNTER — Other Ambulatory Visit: Payer: Self-pay

## 2019-02-04 ENCOUNTER — Encounter: Payer: Self-pay | Admitting: Internal Medicine

## 2019-02-04 ENCOUNTER — Ambulatory Visit (INDEPENDENT_AMBULATORY_CARE_PROVIDER_SITE_OTHER): Payer: Medicare Other | Admitting: Internal Medicine

## 2019-02-04 VITALS — BP 128/80 | HR 82 | Ht 67.0 in | Wt 183.0 lb

## 2019-02-04 DIAGNOSIS — Z95810 Presence of automatic (implantable) cardiac defibrillator: Secondary | ICD-10-CM

## 2019-02-04 DIAGNOSIS — I480 Paroxysmal atrial fibrillation: Secondary | ICD-10-CM

## 2019-02-04 DIAGNOSIS — I5022 Chronic systolic (congestive) heart failure: Secondary | ICD-10-CM | POA: Diagnosis not present

## 2019-02-04 DIAGNOSIS — I472 Ventricular tachycardia: Secondary | ICD-10-CM | POA: Diagnosis not present

## 2019-02-04 DIAGNOSIS — I4729 Other ventricular tachycardia: Secondary | ICD-10-CM

## 2019-02-04 NOTE — Patient Instructions (Signed)
Medication Instructions:  Your physician recommends that you continue on your current medications as directed. Please refer to the Current Medication list given to you today.  Labwork: None ordered.  Testing/Procedures: None ordered.  Follow-Up: Your physician wants you to follow-up in: one year with Dr. Lovena Le.   You will receive a reminder letter in the mail two months in advance. If you don't receive a letter, please call our office to schedule the follow-up appointment.  Remote monitoring is used to monitor your ICD from home. This monitoring reduces the number of office visits required to check your device to one time per year. It allows [place] to keep an eye on the functioning of your device to ensure it is working properly. You are scheduled for a device check from home on 03/11/2019. You may send your transmission at any time that day. If you have a wireless device, the transmission will be sent automatically. After your physician reviews your transmission, you will receive a postcard with your next transmission date.  Any Other Special Instructions Will Be Listed Below (If Applicable).  If you need a refill on your cardiac medications before your next appointment, please call your pharmacy.

## 2019-02-04 NOTE — Progress Notes (Signed)
HPI Mrs. Lamping returns today for followup of an ICM, VT, PAF s/p ICD insertion. In the interim she has done well with no chest pain or sob. No sycnope. She has undergone both hip and knee replacement.  Allergies  Allergen Reactions  . Sumatriptan Succinate Other (See Comments)    Chest pain, no triptans, pt states "it makes my heart race"  . Amitriptyline Other (See Comments)    Weight gain     Current Outpatient Medications  Medication Sig Dispense Refill  . B Complex-C-Folic Acid (STRESS FORMULA) TABS Take 1 tablet by mouth daily.     . baclofen (LIORESAL) 10 MG tablet Take 1 tablet (10 mg total) by mouth daily as needed (MIGRAINES). Or for muscle spasm 30 each 0  . Calcium Carbonate-Vitamin D (CALCIUM-D) 600-400 MG-UNIT TABS Take 1 tablet by mouth daily.     . carvedilol (COREG) 12.5 MG tablet Take 12.5 mg by mouth 2 (two) times daily with a meal.     . Cholecalciferol (VITAMIN D-3) 1000 UNITS CAPS Take 1,000 Units by mouth daily.     . diphenhydrAMINE (BENADRYL) 25 mg capsule Take 25 mg by mouth 2 (two) times a day.     Marland Kitchen ELIQUIS 5 MG TABS tablet TAKE 1 TABLET BY MOUTH TWICE A DAY 60 tablet 5  . FIBER PO Take 1 capsule by mouth daily.    . Omega-3 Fatty Acids (FISH OIL) 1000 MG CAPS Take 1,000 mg by mouth daily.     Marland Kitchen PARoxetine (PAXIL) 40 MG tablet Take 1 tablet by mouth daily.    . ramipril (ALTACE) 2.5 MG tablet Take 2.5 mg by mouth daily.      . simvastatin (ZOCOR) 40 MG tablet Take 40 mg by mouth at bedtime.      . traZODone (DESYREL) 50 MG tablet Take 50 mg by mouth at bedtime.     . vitamin E 400 UNIT capsule Take 400 Units by mouth daily.    Marland Kitchen zonisamide (ZONEGRAN) 50 MG capsule Take 50-100 mg by mouth See admin instructions. Take 50mg  by mouth in the morning and 100mg  at night.     No current facility-administered medications for this visit.      Past Medical History:  Diagnosis Date  . AICD (automatic cardioverter/defibrillator) present dual   Medtronic  ---  original placedment 2007/  generator change 2014 by dr    . Anemia   . Anticoagulant long-term use    eliquis  . Anxiety   . CAD (coronary artery disease) primary cardiologist-- dr     MI and 2 stents 1995 in [place]  . CHF NYHA class II, chronic, systolic ([place])    followed by dr Carleene Overlie   . Depression   . Dyslipidemia   . History of basal cell carcinoma (BCC) excision    right ankle area s/p  excision in office 06/ 2019  in office  . History of DVT of lower extremity yrs ago before 2012  . History of MI (myocardial infarction) 1995  in [place]  . History of pulmonary embolus (PE) 2012  . History of ventricular tachycardia   . Hyperlipidemia   . Hypersomnia   . Ischemic cardiomyopathy   . Migraines   . OA (osteoarthritis)    "all over"  . PAF (paroxysmal atrial fibrillation) ([place])   . Primary localized osteoarthritis of right hip 12/18/2017  . Primary localized osteoarthritis of right knee 05/28/2018  . S/P coronary artery stent placement 1995  in [place]   05-21-2018 per pt x2  stents in same coronary artery (unsure BM or DES)    ROS:   All systems reviewed and negative except as noted in the HPI.   Past Surgical History:  Procedure Laterality Date  . CARDIAC DEFIBRILLATOR PLACEMENT  2007  . CATARACT EXTRACTION, BILATERAL    . COLONOSCOPY  04/14/2013   colonic polyp, status post polypectomy. Mild panocolonic diverticulosis. Small internal hemorrhoids  . DILATION AND CURETTAGE OF UTERUS  yrs ago  . IMPLANTABLE CARDIOVERTER DEFIBRILLATOR GENERATOR CHANGE N/A 02/04/2013   Procedure: IMPLANTABLE CARDIOVERTER DEFIBRILLATOR GENERATOR CHANGE;  Surgeon: Evans Lance, MD;  Location[facility] CATH LAB;  Service: Cardiovascular;  Laterality: N/A;  . SHOULDER ARTHROSCOPY Right 2015  . TOTAL HIP ARTHROPLASTY Right 12/18/2017   Procedure: RIGHT TOTAL HIP ARTHROPLASTY;  Surgeon: Marchia Bond, MD;  Location: [facility];  Service: Orthopedics;  Laterality: Right;  .  TOTAL KNEE ARTHROPLASTY Right 05/28/2018   Procedure: TOTAL KNEE ARTHROPLASTY;  Surgeon: Marchia Bond, MD;  Location: [facility];  Service: Orthopedics;  Laterality: Right;  Adductor Block  . TUBAL LIGATION Bilateral yrs ago  . WISDOM TOOTH EXTRACTION       Family History  Problem Relation Age of Onset  . Hypertension Mother   . Thyroid disease Mother   . Alzheimer's disease Mother   . Coronary artery disease Father   . Pulmonary embolism Father   . Congestive Heart Failure Maternal Grandmother   . Hypertension Maternal Grandmother   . Heart attack Maternal Grandfather   . Other Maternal Grandfather        carotid disease  . Dementia Paternal Grandmother   . Other Paternal Grandfather 57       accident     Social History   Socioeconomic History  . Marital status: Single    Spouse name: Not on file  . Number of children: Not on file  . Years of education: Not on file  . Highest education level: Not on file  Occupational History  . Not on file  Social Needs  . Financial resource strain: Not hard at all  . Food insecurity    Worry: Never true    Inability: Never true  . Transportation needs    Medical: No    Non-medical: No  Tobacco Use  . Smoking status: Former Smoker    Years: 30.00    Types: Cigarettes    Quit date: 05/21/1996    Years since quitting: 22.7  . Smokeless tobacco: Never Used  Substance and Sexual Activity  . Alcohol use: No  . Drug use: Never  . Sexual activity: Not on file  Lifestyle  . Physical activity    Days per week: Not on file    Minutes per session: Not on file  . Stress: Not on file  Relationships  . Social Herbalist on phone: Not on file    Gets together: Not on file    Attends religious service: Not on file    Active member of club or organization: Not on file    Attends meetings of clubs or organizations: Not on file    Relationship status: Not on file  . Intimate partner violence    Fear of current or ex partner:  Not on file    Emotionally abused: Not on file    Physically abused: Not on file    Forced sexual activity: Not on file  Other Topics Concern  . Not on file  Social History Narrative  . Not on file     BP 128/80   Pulse 82   Ht 5\' 7"  (1.702 m)   Wt 183 lb (83 kg)   SpO2 92%   BMI 28.66 kg/m   Physical Exam:  Well appearing NAD HEENT: Unremarkable Neck:  No JVD, no thyromegally Lymphatics:  No adenopathy Back:  No CVA tenderness Lungs:  Clear with no wheezes HEART:  Regular rate rhythm, no murmurs, no rubs, no clicks Abd:  soft, positive bowel sounds, no organomegally, no rebound, no guarding Ext:  2 plus pulses, no edema, no cyanosis, no clubbing Skin:  No rashes no nodules Neuro:  CN II through XII intact, motor grossly intact  EKG - nsr with atrial pacing  DEVICE  Normal device function.  See PaceArt for details.   Assess/Plan: 1. VT- she has had none since her last visit.  2. PAF - she has had none since her last visit. I considered stopping her Eliquis but will continue for now. 3. ICD - her Medtronic DDD ICD is working normally. 4. ICM - she denies chest pain or syncope.   Mikle Bosworth.D.

## 2019-02-17 ENCOUNTER — Other Ambulatory Visit: Payer: Self-pay

## 2019-02-17 MED ORDER — APIXABAN 5 MG PO TABS: 5.0000 mg | ORAL_TABLET | Freq: Two times a day (BID) | ORAL | 5 refills | Status: DC

## 2019-02-17 NOTE — Telephone Encounter (Signed)
Pt last saw Dr Lovena Le 02/04/19, last labs 05/29/18 Creat 0.76, age 73, weight 83kg, based on specified criteria pt is on appropriate dosage of Eliquis 5mg  BID.  Will refill rx.

## 2019-02-21 LAB — CUP PACEART INCLINIC DEVICE CHECK
Date Time Interrogation Session: 20200814154830
Implantable Lead Implant Date: 20071003
Implantable Lead Implant Date: 20071019
Implantable Lead Location: 753859
Implantable Lead Location: 753860
Implantable Lead Model: 5076
Implantable Lead Model: 6947
Implantable Pulse Generator Implant Date: 20140729

## 2019-03-09 DIAGNOSIS — Z23 Encounter for immunization: Secondary | ICD-10-CM | POA: Diagnosis not present

## 2019-03-11 ENCOUNTER — Ambulatory Visit (INDEPENDENT_AMBULATORY_CARE_PROVIDER_SITE_OTHER): Payer: Medicare Other | Admitting: *Deleted

## 2019-03-11 DIAGNOSIS — I480 Paroxysmal atrial fibrillation: Secondary | ICD-10-CM

## 2019-03-11 DIAGNOSIS — I5022 Chronic systolic (congestive) heart failure: Secondary | ICD-10-CM

## 2019-03-11 LAB — CUP PACEART REMOTE DEVICE CHECK
Battery Remaining Longevity: 40 mo
Battery Voltage: 2.97 V
Brady Statistic AP VP Percent: 0 %
Brady Statistic AP VS Percent: 3.36 %
Brady Statistic AS VP Percent: 0.03 %
Brady Statistic AS VS Percent: 96.61 %
Brady Statistic RA Percent Paced: 3.35 %
Brady Statistic RV Percent Paced: 0.03 %
Date Time Interrogation Session: 20200901135115
HighPow Impedance: 46 Ohm
HighPow Impedance: 58 Ohm
Implantable Lead Implant Date: 20071003
Implantable Lead Implant Date: 20071019
Implantable Lead Location: 753859
Implantable Lead Location: 753860
Implantable Lead Model: 5076
Implantable Lead Model: 6947
Implantable Pulse Generator Implant Date: 20140729
Lead Channel Impedance Value: 285 Ohm
Lead Channel Impedance Value: 399 Ohm
Lead Channel Impedance Value: 456 Ohm
Lead Channel Pacing Threshold Amplitude: 0.5 V
Lead Channel Pacing Threshold Amplitude: 1.125 V
Lead Channel Pacing Threshold Pulse Width: 0.4 ms
Lead Channel Pacing Threshold Pulse Width: 0.4 ms
Lead Channel Sensing Intrinsic Amplitude: 3.125 mV
Lead Channel Sensing Intrinsic Amplitude: 3.125 mV
Lead Channel Sensing Intrinsic Amplitude: 7.875 mV
Lead Channel Sensing Intrinsic Amplitude: 7.875 mV
Lead Channel Setting Pacing Amplitude: 2 V
Lead Channel Setting Pacing Amplitude: 2.5 V
Lead Channel Setting Pacing Pulse Width: 0.4 ms
Lead Channel Setting Sensing Sensitivity: 0.3 mV

## 2019-03-25 DIAGNOSIS — H532 Diplopia: Secondary | ICD-10-CM | POA: Diagnosis not present

## 2019-03-25 DIAGNOSIS — H26491 Other secondary cataract, right eye: Secondary | ICD-10-CM | POA: Diagnosis not present

## 2019-03-27 NOTE — Progress Notes (Signed)
Remote ICD transmission.   

## 2019-04-10 DIAGNOSIS — R739 Hyperglycemia, unspecified: Secondary | ICD-10-CM | POA: Diagnosis not present

## 2019-04-10 DIAGNOSIS — I10 Essential (primary) hypertension: Secondary | ICD-10-CM | POA: Diagnosis not present

## 2019-04-10 DIAGNOSIS — E559 Vitamin D deficiency, unspecified: Secondary | ICD-10-CM | POA: Diagnosis not present

## 2019-04-10 DIAGNOSIS — F339 Major depressive disorder, recurrent, unspecified: Secondary | ICD-10-CM | POA: Diagnosis not present

## 2019-04-10 DIAGNOSIS — E782 Mixed hyperlipidemia: Secondary | ICD-10-CM | POA: Diagnosis not present

## 2019-04-11 ENCOUNTER — Encounter: Payer: Self-pay | Admitting: Gastroenterology

## 2019-04-22 ENCOUNTER — Encounter: Payer: Self-pay | Admitting: Gastroenterology

## 2019-05-01 ENCOUNTER — Ambulatory Visit: Payer: Medicare Other | Admitting: Gastroenterology

## 2019-05-13 DIAGNOSIS — Z1231 Encounter for screening mammogram for malignant neoplasm of breast: Secondary | ICD-10-CM | POA: Diagnosis not present

## 2019-05-20 ENCOUNTER — Encounter: Payer: Self-pay | Admitting: Family Medicine

## 2019-05-22 ENCOUNTER — Ambulatory Visit: Payer: Medicare Other | Admitting: Gastroenterology

## 2019-06-10 ENCOUNTER — Ambulatory Visit (INDEPENDENT_AMBULATORY_CARE_PROVIDER_SITE_OTHER): Payer: Medicare Other | Admitting: *Deleted

## 2019-06-10 DIAGNOSIS — I5022 Chronic systolic (congestive) heart failure: Secondary | ICD-10-CM | POA: Diagnosis not present

## 2019-06-10 LAB — CUP PACEART REMOTE DEVICE CHECK
Battery Remaining Longevity: 37 mo
Battery Voltage: 2.96 V
Brady Statistic AP VP Percent: 0 %
Brady Statistic AP VS Percent: 5.58 %
Brady Statistic AS VP Percent: 0.03 %
Brady Statistic AS VS Percent: 94.39 %
Brady Statistic RA Percent Paced: 5.55 %
Brady Statistic RV Percent Paced: 0.04 %
Date Time Interrogation Session: 20201201123310
HighPow Impedance: 47 Ohm
HighPow Impedance: 61 Ohm
Implantable Lead Implant Date: 20071003
Implantable Lead Implant Date: 20071019
Implantable Lead Location: 753859
Implantable Lead Location: 753860
Implantable Lead Model: 5076
Implantable Lead Model: 6947
Implantable Pulse Generator Implant Date: 20140729
Lead Channel Impedance Value: 304 Ohm
Lead Channel Impedance Value: 418 Ohm
Lead Channel Impedance Value: 456 Ohm
Lead Channel Pacing Threshold Amplitude: 0.5 V
Lead Channel Pacing Threshold Amplitude: 1 V
Lead Channel Pacing Threshold Pulse Width: 0.4 ms
Lead Channel Pacing Threshold Pulse Width: 0.4 ms
Lead Channel Sensing Intrinsic Amplitude: 11 mV
Lead Channel Sensing Intrinsic Amplitude: 11 mV
Lead Channel Sensing Intrinsic Amplitude: 2 mV
Lead Channel Sensing Intrinsic Amplitude: 2 mV
Lead Channel Setting Pacing Amplitude: 2 V
Lead Channel Setting Pacing Amplitude: 2.5 V
Lead Channel Setting Pacing Pulse Width: 0.4 ms
Lead Channel Setting Sensing Sensitivity: 0.3 mV

## 2019-07-05 NOTE — Progress Notes (Signed)
ICD remote 

## 2019-07-21 DIAGNOSIS — M1990 Unspecified osteoarthritis, unspecified site: Secondary | ICD-10-CM | POA: Diagnosis not present

## 2019-07-21 DIAGNOSIS — M792 Neuralgia and neuritis, unspecified: Secondary | ICD-10-CM | POA: Diagnosis not present

## 2019-07-21 DIAGNOSIS — R519 Headache, unspecified: Secondary | ICD-10-CM | POA: Diagnosis not present

## 2019-08-11 DIAGNOSIS — Z1331 Encounter for screening for depression: Secondary | ICD-10-CM | POA: Diagnosis not present

## 2019-08-11 DIAGNOSIS — E785 Hyperlipidemia, unspecified: Secondary | ICD-10-CM | POA: Diagnosis not present

## 2019-08-11 DIAGNOSIS — Z139 Encounter for screening, unspecified: Secondary | ICD-10-CM | POA: Diagnosis not present

## 2019-08-11 DIAGNOSIS — Z9181 History of falling: Secondary | ICD-10-CM | POA: Diagnosis not present

## 2019-08-11 DIAGNOSIS — Z0000 Encounter for general adult medical examination without abnormal findings: Secondary | ICD-10-CM | POA: Diagnosis not present

## 2019-08-11 DIAGNOSIS — E669 Obesity, unspecified: Secondary | ICD-10-CM | POA: Diagnosis not present

## 2019-08-11 DIAGNOSIS — Z1211 Encounter for screening for malignant neoplasm of colon: Secondary | ICD-10-CM | POA: Diagnosis not present

## 2019-08-11 DIAGNOSIS — N959 Unspecified menopausal and perimenopausal disorder: Secondary | ICD-10-CM | POA: Diagnosis not present

## 2019-09-09 ENCOUNTER — Ambulatory Visit (INDEPENDENT_AMBULATORY_CARE_PROVIDER_SITE_OTHER): Payer: Medicare Other | Admitting: *Deleted

## 2019-09-09 DIAGNOSIS — I5022 Chronic systolic (congestive) heart failure: Secondary | ICD-10-CM | POA: Diagnosis not present

## 2019-09-10 ENCOUNTER — Telehealth: Payer: Self-pay | Admitting: Internal Medicine

## 2019-09-10 LAB — CUP PACEART REMOTE DEVICE CHECK
Battery Remaining Longevity: 33 mo
Battery Voltage: 2.95 V
Brady Statistic AP VP Percent: 0 %
Brady Statistic AP VS Percent: 3.59 %
Brady Statistic AS VP Percent: 0.03 %
Brady Statistic AS VS Percent: 96.37 %
Brady Statistic RA Percent Paced: 3.58 %
Brady Statistic RV Percent Paced: 0.04 %
Date Time Interrogation Session: 20210303165040
HighPow Impedance: 55 Ohm
HighPow Impedance: 69 Ohm
Implantable Lead Implant Date: 20071003
Implantable Lead Implant Date: 20071019
Implantable Lead Location: 753859
Implantable Lead Location: 753860
Implantable Lead Model: 5076
Implantable Lead Model: 6947
Implantable Pulse Generator Implant Date: 20140729
Lead Channel Impedance Value: 304 Ohm
Lead Channel Impedance Value: 399 Ohm
Lead Channel Impedance Value: 475 Ohm
Lead Channel Pacing Threshold Amplitude: 0.5 V
Lead Channel Pacing Threshold Amplitude: 0.875 V
Lead Channel Pacing Threshold Pulse Width: 0.4 ms
Lead Channel Pacing Threshold Pulse Width: 0.4 ms
Lead Channel Sensing Intrinsic Amplitude: 11.25 mV
Lead Channel Sensing Intrinsic Amplitude: 11.25 mV
Lead Channel Sensing Intrinsic Amplitude: 2.75 mV
Lead Channel Sensing Intrinsic Amplitude: 2.75 mV
Lead Channel Setting Pacing Amplitude: 2 V
Lead Channel Setting Pacing Amplitude: 2.5 V
Lead Channel Setting Pacing Pulse Width: 0.4 ms
Lead Channel Setting Sensing Sensitivity: 0.3 mV

## 2019-09-10 NOTE — Telephone Encounter (Signed)
  1. Has your device fired? no  2. Is you device beeping? no  3. Are you experiencing draining or swelling at device site? no  4. Are you calling to see if we received your device transmission? yes  5. Have you passed out? no  Patient states she forgot to send her transmission yesterday and sent it in today. She would like to know if it was received.   Please route to [place]

## 2019-09-10 NOTE — Telephone Encounter (Signed)
Confirmed transmission received, function looks normal.

## 2019-09-11 NOTE — Progress Notes (Signed)
ICD Remote  

## 2019-09-13 DIAGNOSIS — Z20828 Contact with and (suspected) exposure to other viral communicable diseases: Secondary | ICD-10-CM | POA: Diagnosis not present

## 2019-10-02 DIAGNOSIS — L820 Inflamed seborrheic keratosis: Secondary | ICD-10-CM | POA: Diagnosis not present

## 2019-10-02 DIAGNOSIS — L578 Other skin changes due to chronic exposure to nonionizing radiation: Secondary | ICD-10-CM | POA: Diagnosis not present

## 2019-10-02 DIAGNOSIS — L821 Other seborrheic keratosis: Secondary | ICD-10-CM | POA: Diagnosis not present

## 2019-10-02 DIAGNOSIS — C44712 Basal cell carcinoma of skin of right lower limb, including hip: Secondary | ICD-10-CM | POA: Diagnosis not present

## 2019-10-09 DIAGNOSIS — E782 Mixed hyperlipidemia: Secondary | ICD-10-CM | POA: Diagnosis not present

## 2019-10-09 DIAGNOSIS — F339 Major depressive disorder, recurrent, unspecified: Secondary | ICD-10-CM | POA: Diagnosis not present

## 2019-10-09 DIAGNOSIS — I10 Essential (primary) hypertension: Secondary | ICD-10-CM | POA: Diagnosis not present

## 2019-10-09 DIAGNOSIS — R739 Hyperglycemia, unspecified: Secondary | ICD-10-CM | POA: Diagnosis not present

## 2019-10-10 ENCOUNTER — Other Ambulatory Visit: Payer: Self-pay | Admitting: Internal Medicine

## 2019-10-10 NOTE — Telephone Encounter (Signed)
Pt last saw Dr Lovena Le 02/04/19, last labs 04/10/19 Creat 0.81 at [facility] per [place], age 74, weight 83kg, based on specified criteria pt is on appropriate dosage of Eliquis 5mg  BID.  Will refill rx.

## 2019-10-20 DIAGNOSIS — H43812 Vitreous degeneration, left eye: Secondary | ICD-10-CM | POA: Diagnosis not present

## 2019-10-20 DIAGNOSIS — H3562 Retinal hemorrhage, left eye: Secondary | ICD-10-CM | POA: Diagnosis not present

## 2019-10-20 DIAGNOSIS — H532 Diplopia: Secondary | ICD-10-CM | POA: Diagnosis not present

## 2019-10-20 DIAGNOSIS — H26491 Other secondary cataract, right eye: Secondary | ICD-10-CM | POA: Diagnosis not present

## 2019-10-27 DIAGNOSIS — M5134 Other intervertebral disc degeneration, thoracic region: Secondary | ICD-10-CM | POA: Diagnosis not present

## 2019-10-27 DIAGNOSIS — R21 Rash and other nonspecific skin eruption: Secondary | ICD-10-CM | POA: Diagnosis not present

## 2019-11-11 ENCOUNTER — Telehealth: Payer: Self-pay

## 2019-11-11 DIAGNOSIS — Z86010 Personal history of colonic polyps: Secondary | ICD-10-CM | POA: Diagnosis not present

## 2019-11-11 DIAGNOSIS — Z1211 Encounter for screening for malignant neoplasm of colon: Secondary | ICD-10-CM | POA: Diagnosis not present

## 2019-11-11 NOTE — Telephone Encounter (Signed)
   [facility] Pre-operative Risk Assessment    Request for surgical clearance:  1. What type of surgery is being performed? Colonoscopy    2. When is this surgery scheduled? 11/20/19   3. What type of clearance is required (medical clearance vs. Pharmacy clearance to hold med vs. Both)? Medical   4. Are there any medications that need to be held prior to surgery and how long? None    5. Practice name and name of physician performing surgery? [facility] Surgical specialist- Adrian, Jerel Shepherd, MD  6. What is your office phone number [phone]    7.   What is your office fax number [phone]  8.   Anesthesia type (None, local, MAC, general) ? None    Rachel Bates 11/11/2019, 2:22 PM  _________________________________________________________________   (provider comments below)

## 2019-11-11 NOTE — Telephone Encounter (Addendum)
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    Procedure: colonoscopy Date of procedure: 11/20/19  CHADS2-VASc score of  6 (age, sex, CHF, CAD, VTE). History of recurrent VTE (DVT in 2007, unprovoked PE in 2012), also has family history of blood clots which her father died from. She was previously bridged with Lovenox by Dr Lindi Adie when she held her Eliquis for a few days in 2019.  CrCl 74mL/min Platelet count 112 (2019)  Per office protocol, patient can hold Eliquis for 1 day prior to procedure.

## 2019-11-12 NOTE — Telephone Encounter (Signed)
   Primary Cardiologist: Cristopher Peru, MD  Chart reviewed as part of pre-operative protocol coverage. Patient was contacted 11/12/2019 in reference to pre-operative risk assessment for pending surgery as outlined below.  Rachel Bates was last seen on 01/2019 by Dr. Lovena Le.  Since that day, Rachel Bates has done well from a cardiac standpoint since that time. No bleeding issues reported.  Therefore, based on ACC/AHA guidelines, the patient would be at acceptable risk for the planned procedure without further cardiovascular testing.   Per Dr. Lovena Le- ok to hold Eliquis up to 3 days prior to procedure, then resume once appropriate.   I will route this recommendation to the requesting party via Epic fax function and remove from pre-op pool.  Please call with questions.  Reino Bellis, NP 11/12/2019, 1:13 PM

## 2019-11-12 NOTE — Telephone Encounter (Signed)
Ok to hold Eliquis for up to 3 days before procedure. Restart after when bleeding risk is acceptable. GT

## 2019-11-13 DIAGNOSIS — H26491 Other secondary cataract, right eye: Secondary | ICD-10-CM | POA: Diagnosis not present

## 2019-11-13 DIAGNOSIS — H43812 Vitreous degeneration, left eye: Secondary | ICD-10-CM | POA: Diagnosis not present

## 2019-11-13 DIAGNOSIS — Z961 Presence of intraocular lens: Secondary | ICD-10-CM | POA: Diagnosis not present

## 2019-11-14 DIAGNOSIS — Z86010 Personal history of colonic polyps: Secondary | ICD-10-CM | POA: Diagnosis not present

## 2019-11-14 DIAGNOSIS — Z20822 Contact with and (suspected) exposure to covid-19: Secondary | ICD-10-CM | POA: Diagnosis not present

## 2019-11-14 DIAGNOSIS — Z01812 Encounter for preprocedural laboratory examination: Secondary | ICD-10-CM | POA: Diagnosis not present

## 2019-11-14 DIAGNOSIS — Z1211 Encounter for screening for malignant neoplasm of colon: Secondary | ICD-10-CM | POA: Diagnosis not present

## 2019-11-20 DIAGNOSIS — Z09 Encounter for follow-up examination after completed treatment for conditions other than malignant neoplasm: Secondary | ICD-10-CM | POA: Diagnosis not present

## 2019-11-20 DIAGNOSIS — Z87891 Personal history of nicotine dependence: Secondary | ICD-10-CM | POA: Diagnosis not present

## 2019-11-20 DIAGNOSIS — E785 Hyperlipidemia, unspecified: Secondary | ICD-10-CM | POA: Diagnosis not present

## 2019-11-20 DIAGNOSIS — Z1211 Encounter for screening for malignant neoplasm of colon: Secondary | ICD-10-CM | POA: Diagnosis not present

## 2019-11-20 DIAGNOSIS — K5730 Diverticulosis of large intestine without perforation or abscess without bleeding: Secondary | ICD-10-CM | POA: Diagnosis not present

## 2019-11-20 DIAGNOSIS — Z79899 Other long term (current) drug therapy: Secondary | ICD-10-CM | POA: Diagnosis not present

## 2019-11-20 DIAGNOSIS — M1990 Unspecified osteoarthritis, unspecified site: Secondary | ICD-10-CM | POA: Diagnosis not present

## 2019-11-20 DIAGNOSIS — Z8679 Personal history of other diseases of the circulatory system: Secondary | ICD-10-CM | POA: Diagnosis not present

## 2019-11-20 DIAGNOSIS — I10 Essential (primary) hypertension: Secondary | ICD-10-CM | POA: Diagnosis not present

## 2019-11-20 DIAGNOSIS — Z955 Presence of coronary angioplasty implant and graft: Secondary | ICD-10-CM | POA: Diagnosis not present

## 2019-11-20 DIAGNOSIS — I2510 Atherosclerotic heart disease of native coronary artery without angina pectoris: Secondary | ICD-10-CM | POA: Diagnosis not present

## 2019-11-20 DIAGNOSIS — Z86010 Personal history of colonic polyps: Secondary | ICD-10-CM | POA: Diagnosis not present

## 2019-12-09 ENCOUNTER — Ambulatory Visit (INDEPENDENT_AMBULATORY_CARE_PROVIDER_SITE_OTHER): Payer: Medicare Other | Admitting: *Deleted

## 2019-12-09 DIAGNOSIS — I5022 Chronic systolic (congestive) heart failure: Secondary | ICD-10-CM

## 2019-12-09 DIAGNOSIS — I4729 Other ventricular tachycardia: Secondary | ICD-10-CM

## 2019-12-09 DIAGNOSIS — I472 Ventricular tachycardia: Secondary | ICD-10-CM

## 2019-12-10 LAB — CUP PACEART REMOTE DEVICE CHECK
Battery Remaining Longevity: 37 mo
Battery Voltage: 2.95 V
Brady Statistic AP VP Percent: 0 %
Brady Statistic AP VS Percent: 4.41 %
Brady Statistic AS VP Percent: 0.03 %
Brady Statistic AS VS Percent: 95.55 %
Brady Statistic RA Percent Paced: 4.39 %
Brady Statistic RV Percent Paced: 0.04 %
Date Time Interrogation Session: 20210601163032
HighPow Impedance: 54 Ohm
HighPow Impedance: 71 Ohm
Implantable Lead Implant Date: 20071003
Implantable Lead Implant Date: 20071019
Implantable Lead Location: 753859
Implantable Lead Location: 753860
Implantable Lead Model: 5076
Implantable Lead Model: 6947
Implantable Pulse Generator Implant Date: 20140729
Lead Channel Impedance Value: 342 Ohm
Lead Channel Impedance Value: 418 Ohm
Lead Channel Impedance Value: 475 Ohm
Lead Channel Pacing Threshold Amplitude: 0.5 V
Lead Channel Pacing Threshold Amplitude: 0.875 V
Lead Channel Pacing Threshold Pulse Width: 0.4 ms
Lead Channel Pacing Threshold Pulse Width: 0.4 ms
Lead Channel Sensing Intrinsic Amplitude: 2.5 mV
Lead Channel Sensing Intrinsic Amplitude: 2.5 mV
Lead Channel Sensing Intrinsic Amplitude: 9.625 mV
Lead Channel Sensing Intrinsic Amplitude: 9.625 mV
Lead Channel Setting Pacing Amplitude: 2 V
Lead Channel Setting Pacing Amplitude: 2.5 V
Lead Channel Setting Pacing Pulse Width: 0.4 ms
Lead Channel Setting Sensing Sensitivity: 0.3 mV

## 2019-12-10 NOTE — Progress Notes (Signed)
Remote ICD transmission.   

## 2020-03-09 ENCOUNTER — Ambulatory Visit (INDEPENDENT_AMBULATORY_CARE_PROVIDER_SITE_OTHER): Payer: Medicare Other | Admitting: *Deleted

## 2020-03-09 DIAGNOSIS — I472 Ventricular tachycardia, unspecified: Secondary | ICD-10-CM

## 2020-03-09 DIAGNOSIS — I480 Paroxysmal atrial fibrillation: Secondary | ICD-10-CM

## 2020-03-09 LAB — CUP PACEART REMOTE DEVICE CHECK
Battery Remaining Longevity: 34 mo
Battery Voltage: 2.95 V
Brady Statistic AP VP Percent: 0.01 %
Brady Statistic AP VS Percent: 4.76 %
Brady Statistic AS VP Percent: 0.03 %
Brady Statistic AS VS Percent: 95.2 %
Brady Statistic RA Percent Paced: 4.74 %
Brady Statistic RV Percent Paced: 0.04 %
Date Time Interrogation Session: 20210831160828
HighPow Impedance: 48 Ohm
HighPow Impedance: 63 Ohm
Implantable Lead Implant Date: 20071003
Implantable Lead Implant Date: 20071019
Implantable Lead Location: 753859
Implantable Lead Location: 753860
Implantable Lead Model: 5076
Implantable Lead Model: 6947
Implantable Pulse Generator Implant Date: 20140729
Lead Channel Impedance Value: 342 Ohm
Lead Channel Impedance Value: 361 Ohm
Lead Channel Impedance Value: 456 Ohm
Lead Channel Pacing Threshold Amplitude: 0.5 V
Lead Channel Pacing Threshold Amplitude: 1 V
Lead Channel Pacing Threshold Pulse Width: 0.4 ms
Lead Channel Pacing Threshold Pulse Width: 0.4 ms
Lead Channel Sensing Intrinsic Amplitude: 2.125 mV
Lead Channel Sensing Intrinsic Amplitude: 2.125 mV
Lead Channel Sensing Intrinsic Amplitude: 7.375 mV
Lead Channel Sensing Intrinsic Amplitude: 7.375 mV
Lead Channel Setting Pacing Amplitude: 2 V
Lead Channel Setting Pacing Amplitude: 2.5 V
Lead Channel Setting Pacing Pulse Width: 0.4 ms
Lead Channel Setting Sensing Sensitivity: 0.3 mV

## 2020-03-10 NOTE — Progress Notes (Signed)
Remote ICD transmission.   

## 2020-03-11 DIAGNOSIS — Z23 Encounter for immunization: Secondary | ICD-10-CM | POA: Diagnosis not present

## 2020-04-09 DIAGNOSIS — Z6828 Body mass index (BMI) 28.0-28.9, adult: Secondary | ICD-10-CM | POA: Diagnosis not present

## 2020-04-09 DIAGNOSIS — G43709 Chronic migraine without aura, not intractable, without status migrainosus: Secondary | ICD-10-CM | POA: Diagnosis not present

## 2020-04-09 DIAGNOSIS — E782 Mixed hyperlipidemia: Secondary | ICD-10-CM | POA: Diagnosis not present

## 2020-04-09 DIAGNOSIS — E559 Vitamin D deficiency, unspecified: Secondary | ICD-10-CM | POA: Diagnosis not present

## 2020-04-09 DIAGNOSIS — M539 Dorsopathy, unspecified: Secondary | ICD-10-CM | POA: Diagnosis not present

## 2020-04-09 DIAGNOSIS — I4891 Unspecified atrial fibrillation: Secondary | ICD-10-CM | POA: Diagnosis not present

## 2020-04-09 DIAGNOSIS — Z95810 Presence of automatic (implantable) cardiac defibrillator: Secondary | ICD-10-CM | POA: Diagnosis not present

## 2020-04-09 DIAGNOSIS — R739 Hyperglycemia, unspecified: Secondary | ICD-10-CM | POA: Diagnosis not present

## 2020-04-09 DIAGNOSIS — I2510 Atherosclerotic heart disease of native coronary artery without angina pectoris: Secondary | ICD-10-CM | POA: Diagnosis not present

## 2020-04-09 DIAGNOSIS — I10 Essential (primary) hypertension: Secondary | ICD-10-CM | POA: Diagnosis not present

## 2020-04-09 DIAGNOSIS — F339 Major depressive disorder, recurrent, unspecified: Secondary | ICD-10-CM | POA: Diagnosis not present

## 2020-04-25 ENCOUNTER — Other Ambulatory Visit: Payer: Self-pay | Admitting: Internal Medicine

## 2020-04-26 NOTE — Telephone Encounter (Signed)
Appt scheduled for 11/16 with Dr Lovena Le. 1 month refill sent in to get pt to visit. If compliant with visit, will send in further refill at that time.

## 2020-04-26 NOTE — Telephone Encounter (Signed)
Eliquis 5mg  refill request received. Patient is 74 years old, weight-83kg, Crea-0.81 on 04/09/2020 via [facility] from [place], [place] and DVT, and last seen by Dr Lovena Le on 02/04/2019-NEEDS AN APPT. Dose is appropriate based on dosing criteria. Will send a message to scheduler regarding appt.

## 2020-05-14 DIAGNOSIS — Z23 Encounter for immunization: Secondary | ICD-10-CM | POA: Diagnosis not present

## 2020-05-22 ENCOUNTER — Other Ambulatory Visit: Payer: Self-pay | Admitting: Internal Medicine

## 2020-05-24 NOTE — Telephone Encounter (Signed)
Prescription refill request for Eliquis received.  Last office visit: Lovena Le 02/04/2019 Scr: 0.81, 04/09/2020 Age:  74 yo Weight: 83 kg   Pt due to see Dr. Lovena Le 05/25/2020.   Prescription refill sent.

## 2020-05-25 ENCOUNTER — Other Ambulatory Visit: Payer: Self-pay

## 2020-05-25 ENCOUNTER — Encounter: Payer: Self-pay | Admitting: Internal Medicine

## 2020-05-25 ENCOUNTER — Ambulatory Visit (INDEPENDENT_AMBULATORY_CARE_PROVIDER_SITE_OTHER): Payer: Medicare Other | Admitting: Internal Medicine

## 2020-05-25 VITALS — BP 124/82 | HR 82 | Ht 67.0 in | Wt 187.4 lb

## 2020-05-25 DIAGNOSIS — I472 Ventricular tachycardia: Secondary | ICD-10-CM | POA: Diagnosis not present

## 2020-05-25 DIAGNOSIS — I255 Ischemic cardiomyopathy: Secondary | ICD-10-CM | POA: Diagnosis not present

## 2020-05-25 DIAGNOSIS — I480 Paroxysmal atrial fibrillation: Secondary | ICD-10-CM | POA: Diagnosis not present

## 2020-05-25 DIAGNOSIS — Z95810 Presence of automatic (implantable) cardiac defibrillator: Secondary | ICD-10-CM | POA: Diagnosis not present

## 2020-05-25 DIAGNOSIS — I4729 Other ventricular tachycardia: Secondary | ICD-10-CM

## 2020-05-25 LAB — CUP PACEART INCLINIC DEVICE CHECK
Battery Remaining Longevity: 32 mo
Battery Voltage: 2.91 V
Brady Statistic AP VP Percent: 0 %
Brady Statistic AP VS Percent: 4.4 %
Brady Statistic AS VP Percent: 0.03 %
Brady Statistic AS VS Percent: 95.56 %
Brady Statistic RA Percent Paced: 4.39 %
Brady Statistic RV Percent Paced: 0.04 %
Date Time Interrogation Session: 20211116161800
HighPow Impedance: 53 Ohm
HighPow Impedance: 67 Ohm
Implantable Lead Implant Date: 20071003
Implantable Lead Implant Date: 20071019
Implantable Lead Location: 753859
Implantable Lead Location: 753860
Implantable Lead Model: 5076
Implantable Lead Model: 6947
Implantable Pulse Generator Implant Date: 20140729
Lead Channel Impedance Value: 304 Ohm
Lead Channel Impedance Value: 399 Ohm
Lead Channel Impedance Value: 456 Ohm
Lead Channel Pacing Threshold Amplitude: 0.5 V
Lead Channel Pacing Threshold Amplitude: 1 V
Lead Channel Pacing Threshold Pulse Width: 0.4 ms
Lead Channel Pacing Threshold Pulse Width: 0.4 ms
Lead Channel Sensing Intrinsic Amplitude: 3 mV
Lead Channel Sensing Intrinsic Amplitude: 8.875 mV
Lead Channel Setting Pacing Amplitude: 2 V
Lead Channel Setting Pacing Amplitude: 2.5 V
Lead Channel Setting Pacing Pulse Width: 0.4 ms
Lead Channel Setting Sensing Sensitivity: 0.3 mV

## 2020-05-25 NOTE — Patient Instructions (Signed)
Medication Instructions:  Your physician recommends that you continue on your current medications as directed. Please refer to the Current Medication list given to you today.  *If you need a refill on your cardiac medications before your next appointment, please call your pharmacy*   Lab Work: None ordered.  If you have labs (blood work) drawn today and your tests are completely normal, you will receive your results only by: MyChart Message (if you have MyChart) OR A paper copy in the mail If you have any lab test that is abnormal or we need to change your treatment, we will call you to review the results.   Testing/Procedures: None ordered.    Follow-Up: At [facility], you and your health needs are our priority.  As part of our continuing mission to provide you with exceptional heart care, we have created designated Provider Care Teams.  These Care Teams include your primary Cardiologist (physician) and Advanced Practice Providers (APPs -  Physician Assistants and Nurse Practitioners) who all work together to provide you with the care you need, when you need it.  We recommend signing up for the patient portal called "MyChart".  Sign up information is provided on this After Visit Summary.  MyChart is used to connect with patients for Virtual Visits (Telemedicine).  Patients are able to view lab/test results, encounter notes, upcoming appointments, etc.  Non-urgent messages can be sent to your provider as well.   To learn more about what you can do with MyChart, go to [email or web address].    Your next appointment:   12 month(s)  The format for your next appointment:   In Person  Provider:   Gregg Taylor, MD    

## 2020-05-25 NOTE — Progress Notes (Signed)
HPI [name] returns today for followup. She is a pleasant 74 yo woman with an ICM, PAF, s/p ICD insertion. She has done well in the interim with no chest pain or sob. No syncope. No ICD therapies.  Allergies  Allergen Reactions  . Sumatriptan Succinate Other (See Comments)    Chest pain, no triptans, pt states "it makes my heart race"  . Amitriptyline Other (See Comments)    Weight gain     Current Outpatient Medications  Medication Sig Dispense Refill  . B Complex-C-Folic Acid (STRESS FORMULA) TABS Take 1 tablet by mouth daily.     . baclofen (LIORESAL) 10 MG tablet Take 1 tablet (10 mg total) by mouth daily as needed (MIGRAINES). Or for muscle spasm 30 each 0  . Calcium Carbonate-Vitamin D (CALCIUM-D) 600-400 MG-UNIT TABS Take 1 tablet by mouth daily.     . carvedilol (COREG) 12.5 MG tablet Take 12.5 mg by mouth 2 (two) times daily with a meal.     . Cholecalciferol (VITAMIN D-3) 1000 UNITS CAPS Take 1,000 Units by mouth daily.     . diphenhydrAMINE (BENADRYL) 25 mg capsule Take 25 mg by mouth 2 (two) times a day.     Marland Kitchen ELIQUIS 5 MG TABS tablet TAKE 1 TABLET BY MOUTH TWICE A DAY 60 tablet 0  . FIBER PO Take 1 capsule by mouth daily.    . furosemide (LASIX) 40 MG tablet Take 1 tablet by mouth as needed.    . gabapentin (NEURONTIN) 100 MG capsule Take 1 capsule by mouth in the morning, at noon, and at bedtime. 2 in the morning and 1 in the afternoon    . gabapentin (NEURONTIN) 300 MG capsule Take 300 mg by mouth daily.    . Omega-3 Fatty Acids (FISH OIL) 1000 MG CAPS Take 1,000 mg by mouth daily.     Marland Kitchen PARoxetine (PAXIL) 40 MG tablet Take 1 tablet by mouth daily.    . ramipril (ALTACE) 2.5 MG tablet Take 2.5 mg by mouth daily.      . simvastatin (ZOCOR) 40 MG tablet Take 40 mg by mouth at bedtime.      . traZODone (DESYREL) 50 MG tablet Take 50 mg by mouth at bedtime.     . vitamin E 400 UNIT capsule Take 400 Units by mouth daily.    Marland Kitchen zonisamide (ZONEGRAN) 50 MG capsule  Take 50-100 mg by mouth See admin instructions. Take 50mg  by mouth in the morning and 100mg  at night.     No current facility-administered medications for this visit.     Past Medical History:  Diagnosis Date  . AICD (automatic cardioverter/defibrillator) present dual   Medtronic ---  original placedment 2007/  generator change 2014 by dr Zayquan Bogard  . Anemia   . Anticoagulant long-term use    eliquis  . Anxiety   . Arthralgia of multiple joints   . Arthritis pain   . Benign hypertensive heart disease   . CAD (coronary artery disease) primary cardiologist-- dr Gabriell Daigneault   MI and 2 stents 1995 in [place]  . CHF NYHA class II, chronic, systolic ([place])    followed by dr Carleene Overlie Antwyne Pingree  . Depression   . Dyslipidemia   . History of basal cell carcinoma (BCC) excision    right ankle area s/p  excision in office 06/ 2019  in office  . History of DVT of lower extremity yrs ago before 2012  . History of MI (  myocardial infarction) 1995  in [place]  . History of pulmonary embolus (PE) 2012  . History of ventricular tachycardia   . Hyperlipidemia   . Hypersomnia   . Ischemic cardiomyopathy   . Migraines   . OA (osteoarthritis)    "all over"  . Obstructive apnea   . PAF (paroxysmal atrial fibrillation) ([place])   . Primary localized osteoarthritis of right hip 12/18/2017  . Primary localized osteoarthritis of right knee 05/28/2018  . S/P coronary artery stent placement 1995   in [place]   05-21-2018 per pt x2  stents in same coronary artery (unsure BM or DES)  . Vitamin D deficiency disease     ROS:   All systems reviewed and negative except as noted in the HPI.   Past Surgical History:  Procedure Laterality Date  . CARDIAC DEFIBRILLATOR PLACEMENT  2007  . CATARACT EXTRACTION, BILATERAL    . COLONOSCOPY  04/14/2013   colonic polyp, status post polypectomy. Mild panocolonic diverticulosis. Small internal hemorrhoids  . DILATION AND CURETTAGE OF UTERUS  yrs ago  . IMPLANTABLE  CARDIOVERTER DEFIBRILLATOR GENERATOR CHANGE N/A 02/04/2013   Procedure: IMPLANTABLE CARDIOVERTER DEFIBRILLATOR GENERATOR CHANGE;  Surgeon: Evans Lance, MD;  Location: [facility] CATH LAB;  Service: Cardiovascular;  Laterality: N/A;  . SHOULDER ARTHROSCOPY Right 2015  . TOTAL HIP ARTHROPLASTY Right 12/18/2017   Procedure: RIGHT TOTAL HIP ARTHROPLASTY;  Surgeon: Marchia Bond, MD;  Location: [facility];  Service: Orthopedics;  Laterality: Right;  . TOTAL KNEE ARTHROPLASTY Right 05/28/2018   Procedure: TOTAL KNEE ARTHROPLASTY;  Surgeon: Marchia Bond, MD;  Location: [facility] ORS;  Service: Orthopedics;  Laterality: Right;  Adductor Block  . TUBAL LIGATION Bilateral yrs ago  . WISDOM TOOTH EXTRACTION       Family History  Problem Relation Age of Onset  . Hypertension Mother   . Thyroid disease Mother   . Alzheimer's disease Mother   . Coronary artery disease Father   . Pulmonary embolism Father   . Congestive Heart Failure Maternal Grandmother   . Hypertension Maternal Grandmother   . Heart attack Maternal Grandfather   . Other Maternal Grandfather        carotid disease  . Dementia Paternal Grandmother   . Other Paternal Grandfather 39       accident     Social History   Socioeconomic History  . Marital status: Single    Spouse name: Not on file  . Number of children: Not on file  . Years of education: Not on file  . Highest education level: Not on file  Occupational History  . Not on file  Tobacco Use  . Smoking status: Former Smoker    Years: 30.00    Types: Cigarettes    Quit date: 05/21/1996    Years since quitting: 24.0  . Smokeless tobacco: Never Used  Vaping Use  . Vaping Use: Never used  Substance and Sexual Activity  . Alcohol use: No  . Drug use: Never  . Sexual activity: Not on file  Other Topics Concern  . Not on file  Social History Narrative  . Not on file   Social Determinants of Health   Financial Resource Strain:   . Difficulty of Paying Living Expenses: Not  on file  Food Insecurity:   . Worried About Charity fundraiser in the Last Year: Not on file  . Ran Out of Food in the Last Year: Not on file  Transportation Needs:   . Lack of Transportation (Medical): Not  on file  . Lack of Transportation (Non-Medical): Not on file  Physical Activity:   . Days of Exercise per Week: Not on file  . Minutes of Exercise per Session: Not on file  Stress:   . Feeling of Stress : Not on file  Social Connections:   . Frequency of Communication with Friends and Family: Not on file  . Frequency of Social Gatherings with Friends and Family: Not on file  . Attends Religious Services: Not on file  . Active Member of Clubs or Organizations: Not on file  . Attends Archivist Meetings: Not on file  . Marital Status: Not on file  Intimate Partner Violence:   . Fear of Current or Ex-Partner: Not on file  . Emotionally Abused: Not on file  . Physically Abused: Not on file  . Sexually Abused: Not on file     BP 124/82   Pulse 82   Ht 5\' 7"  (1.702 m)   Wt 187 lb 6.4 oz (85 kg)   SpO2 94%   BMI 29.35 kg/m   Physical Exam:  Well appearing 74 yo woman, NAD HEENT: Unremarkable Neck:  No JVD, no thyromegally Lymphatics:  No adenopathy Back:  No CVA tenderness Lungs:  Clear with no wheezes HEART:  Regular rate rhythm, no murmurs, no rubs, no clicks Abd:  soft, positive bowel sounds, no organomegally, no rebound, no guarding Ext:  2 plus pulses, no edema, no cyanosis, no clubbing Skin:  No rashes no nodules Neuro:  CN II through XII intact, motor grossly intact  EKG - nsr with atrial pacing  DEVICE  Normal device function.  See PaceArt for details.   Assess/Plan: 1. ICM - she denies anginal symptoms. She will continue her current meds. 2. ICD - her medtronic PPM is working normally. 3. VT - she has not had any recurrent VT since her last visit. 4. PAF - she is remaining in NSR. She will continue her eliquis.  Carleene Overlie Conny Situ,MD

## 2020-05-31 IMAGING — DX DG HIP (WITH OR WITHOUT PELVIS) 1V PORT*R*
2 series · 2 of 2 positions shown · non-contrast
Comparison: None.

CLINICAL DATA: Status post right hip replacement today.

EXAM:
DG HIP (WITH OR WITHOUT PELVIS) 1V PORT RIGHT

[pelvis]
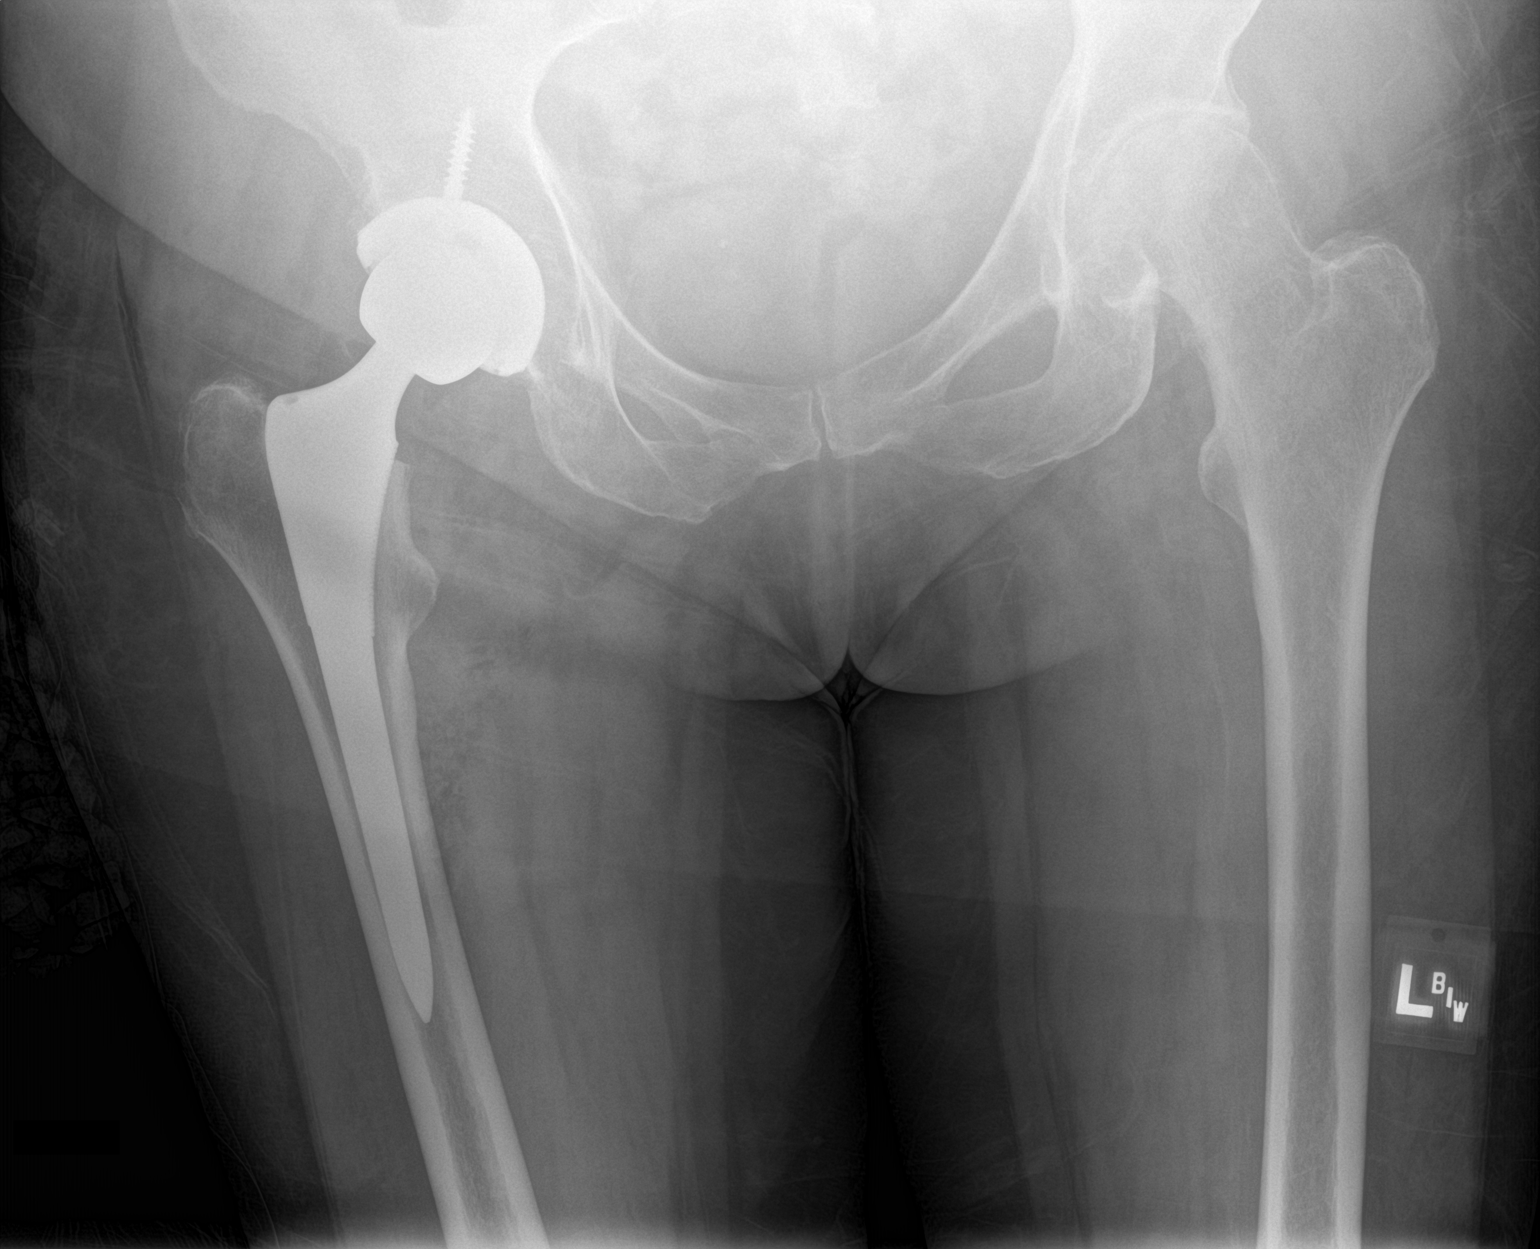

[hip]
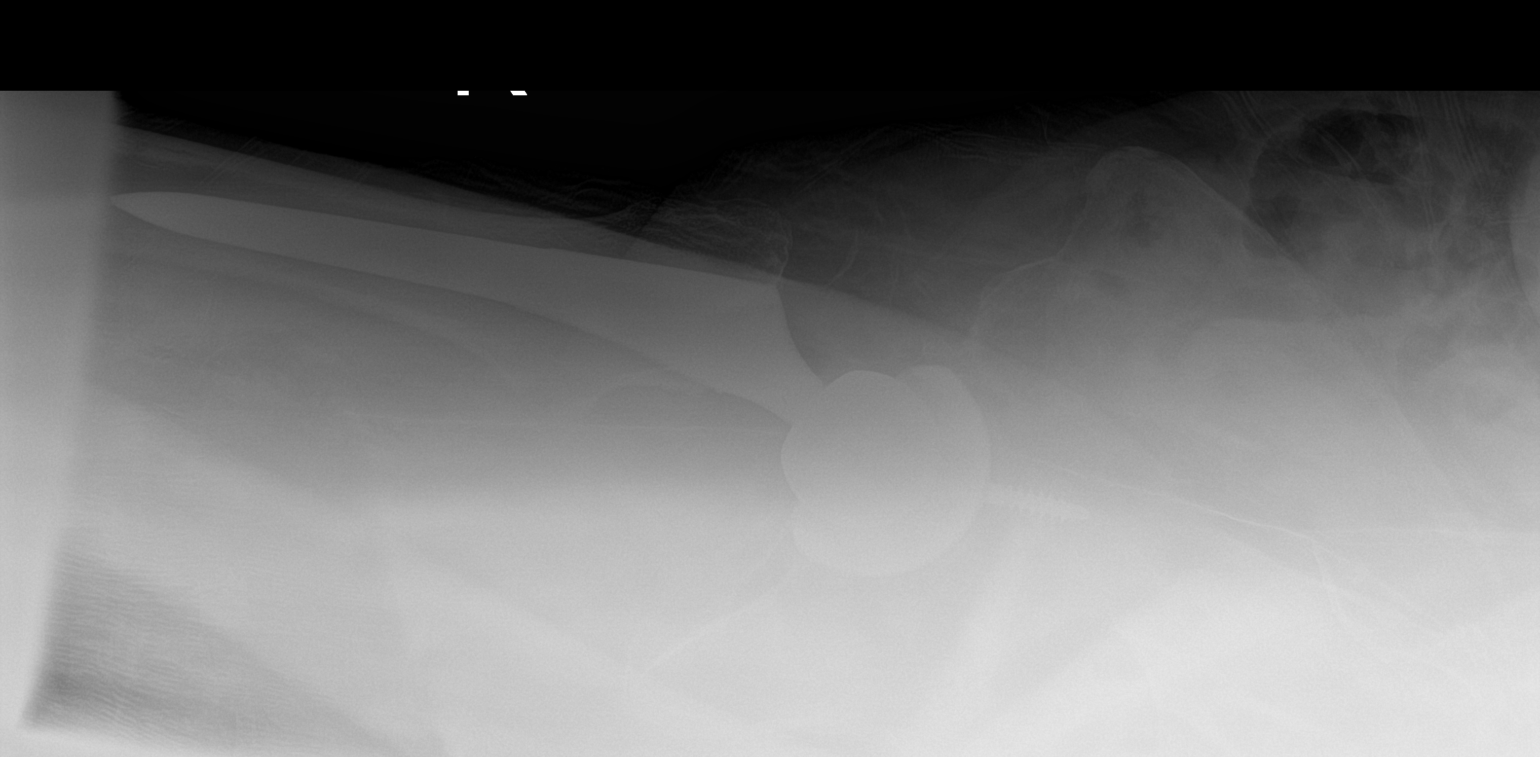

[2 of 2 positions shown; findings below may reference images not displayed]

FINDINGS: Right total hip arthroplasty is in place. The device is located. No
fracture. Left hip osteoarthritis noted.
IMPRESSION: Status post right total hip arthroplasty.  No acute finding.

Left hip osteoarthritis.

## 2020-06-06 LAB — CUP PACEART REMOTE DEVICE CHECK
Battery Remaining Longevity: 31 mo
Battery Voltage: 2.94 V
Brady Statistic AP VP Percent: 0.01 %
Brady Statistic AP VS Percent: 4.87 %
Brady Statistic AS VP Percent: 0.03 %
Brady Statistic AS VS Percent: 95.1 %
Brady Statistic RA Percent Paced: 4.85 %
Brady Statistic RV Percent Paced: 0.04 %
Date Time Interrogation Session: 20211126172208
HighPow Impedance: 57 Ohm
HighPow Impedance: 72 Ohm
Implantable Lead Implant Date: 20071003
Implantable Lead Implant Date: 20071019
Implantable Lead Location: 753859
Implantable Lead Location: 753860
Implantable Lead Model: 5076
Implantable Lead Model: 6947
Implantable Pulse Generator Implant Date: 20140729
Lead Channel Impedance Value: 304 Ohm
Lead Channel Impedance Value: 418 Ohm
Lead Channel Impedance Value: 475 Ohm
Lead Channel Pacing Threshold Amplitude: 0.5 V
Lead Channel Pacing Threshold Amplitude: 1 V
Lead Channel Pacing Threshold Pulse Width: 0.4 ms
Lead Channel Pacing Threshold Pulse Width: 0.4 ms
Lead Channel Sensing Intrinsic Amplitude: 2.375 mV
Lead Channel Sensing Intrinsic Amplitude: 2.375 mV
Lead Channel Sensing Intrinsic Amplitude: 8.125 mV
Lead Channel Sensing Intrinsic Amplitude: 8.125 mV
Lead Channel Setting Pacing Amplitude: 2 V
Lead Channel Setting Pacing Amplitude: 2.5 V
Lead Channel Setting Pacing Pulse Width: 0.4 ms
Lead Channel Setting Sensing Sensitivity: 0.3 mV

## 2020-06-08 ENCOUNTER — Ambulatory Visit (INDEPENDENT_AMBULATORY_CARE_PROVIDER_SITE_OTHER): Payer: Medicare Other

## 2020-06-08 DIAGNOSIS — I472 Ventricular tachycardia, unspecified: Secondary | ICD-10-CM

## 2020-06-08 DIAGNOSIS — I4729 Other ventricular tachycardia: Secondary | ICD-10-CM

## 2020-06-15 NOTE — Progress Notes (Signed)
Remote ICD transmission.   

## 2020-07-23 ENCOUNTER — Other Ambulatory Visit: Payer: Self-pay | Admitting: Internal Medicine

## 2020-07-23 ENCOUNTER — Other Ambulatory Visit: Payer: Self-pay

## 2020-07-23 DIAGNOSIS — Z86718 Personal history of other venous thrombosis and embolism: Secondary | ICD-10-CM

## 2020-07-23 DIAGNOSIS — Z5181 Encounter for therapeutic drug level monitoring: Secondary | ICD-10-CM

## 2020-07-23 DIAGNOSIS — I480 Paroxysmal atrial fibrillation: Secondary | ICD-10-CM

## 2020-07-23 DIAGNOSIS — I2782 Chronic pulmonary embolism: Secondary | ICD-10-CM

## 2020-07-23 NOTE — Telephone Encounter (Signed)
Pt's age 75, wt 60 kg, last ov w/ GT 05/25/20.  Pt overdue for labs.

## 2020-07-29 ENCOUNTER — Telehealth: Payer: Self-pay | Admitting: Internal Medicine

## 2020-07-29 MED ORDER — APIXABAN 5 MG PO TABS
5.0000 mg | ORAL_TABLET | Freq: Two times a day (BID) | ORAL | 11 refills | Status: DC
Start: 2020-07-29 — End: 2021-08-24

## 2020-07-29 NOTE — Addendum Note (Signed)
Addended by: Willeen Cass A on: 07/29/2020 04:22 PM   Modules accepted: Orders

## 2020-07-29 NOTE — Telephone Encounter (Signed)
Patient wanted to know if we could mail her a lab slip to have her BMET done near her [place]. The patient went to pick up her last refill of Eliquis today and there was a note on her bottle that said she needs labs done before any future refills will be provided. The patient said she is leaving today to go to [place] to help her sister recover from knee surgery and she is not sure how long she will be there. Please mail lab orders to her sister's address at:  [address] [place] [address]

## 2020-07-29 NOTE — Telephone Encounter (Signed)
Returned call to Pt.  Per Pt she has regular labs (every 6 months) at her PCP office.  Outreach made to PCP office.  They will fax most recent labs from last year to our office to evaluate for Eliquis dosing.

## 2020-07-29 NOTE — Telephone Encounter (Signed)
Per Pt-she has labs done every 6 months at her PCP office.  Outreach made to PCP office.  Labs will be faxed.

## 2020-07-30 ENCOUNTER — Telehealth: Payer: Self-pay | Admitting: Internal Medicine

## 2020-07-30 NOTE — Telephone Encounter (Signed)
Labs received & placed in Dr. Tanna Furry box 07/30/20

## 2020-08-11 DIAGNOSIS — Z9181 History of falling: Secondary | ICD-10-CM | POA: Diagnosis not present

## 2020-08-11 DIAGNOSIS — Z0000 Encounter for general adult medical examination without abnormal findings: Secondary | ICD-10-CM | POA: Diagnosis not present

## 2020-08-11 DIAGNOSIS — Z1331 Encounter for screening for depression: Secondary | ICD-10-CM | POA: Diagnosis not present

## 2020-08-11 DIAGNOSIS — E785 Hyperlipidemia, unspecified: Secondary | ICD-10-CM | POA: Diagnosis not present

## 2020-08-18 DIAGNOSIS — Z6826 Body mass index (BMI) 26.0-26.9, adult: Secondary | ICD-10-CM | POA: Diagnosis not present

## 2020-08-18 DIAGNOSIS — R0982 Postnasal drip: Secondary | ICD-10-CM | POA: Diagnosis not present

## 2020-08-18 DIAGNOSIS — J029 Acute pharyngitis, unspecified: Secondary | ICD-10-CM | POA: Diagnosis not present

## 2020-09-07 ENCOUNTER — Ambulatory Visit (INDEPENDENT_AMBULATORY_CARE_PROVIDER_SITE_OTHER): Payer: Medicare Other

## 2020-09-07 DIAGNOSIS — I4729 Other ventricular tachycardia: Secondary | ICD-10-CM

## 2020-09-07 DIAGNOSIS — I472 Ventricular tachycardia: Secondary | ICD-10-CM | POA: Diagnosis not present

## 2020-09-07 DIAGNOSIS — I255 Ischemic cardiomyopathy: Secondary | ICD-10-CM

## 2020-09-09 LAB — CUP PACEART REMOTE DEVICE CHECK
Battery Remaining Longevity: 29 mo
Battery Voltage: 2.93 V
Brady Statistic AP VP Percent: 0 %
Brady Statistic AP VS Percent: 6.11 %
Brady Statistic AS VP Percent: 0.03 %
Brady Statistic AS VS Percent: 93.85 %
Brady Statistic RA Percent Paced: 6.08 %
Brady Statistic RV Percent Paced: 0.04 %
Date Time Interrogation Session: 20220303110108
HighPow Impedance: 50 Ohm
HighPow Impedance: 67 Ohm
Implantable Lead Implant Date: 20071003
Implantable Lead Implant Date: 20071019
Implantable Lead Location: 753859
Implantable Lead Location: 753860
Implantable Lead Model: 5076
Implantable Lead Model: 6947
Implantable Pulse Generator Implant Date: 20140729
Lead Channel Impedance Value: 304 Ohm
Lead Channel Impedance Value: 399 Ohm
Lead Channel Impedance Value: 456 Ohm
Lead Channel Pacing Threshold Amplitude: 0.5 V
Lead Channel Pacing Threshold Amplitude: 1 V
Lead Channel Pacing Threshold Pulse Width: 0.4 ms
Lead Channel Pacing Threshold Pulse Width: 0.4 ms
Lead Channel Sensing Intrinsic Amplitude: 2.625 mV
Lead Channel Sensing Intrinsic Amplitude: 2.625 mV
Lead Channel Sensing Intrinsic Amplitude: 9.875 mV
Lead Channel Sensing Intrinsic Amplitude: 9.875 mV
Lead Channel Setting Pacing Amplitude: 2 V
Lead Channel Setting Pacing Amplitude: 2.5 V
Lead Channel Setting Pacing Pulse Width: 0.4 ms
Lead Channel Setting Sensing Sensitivity: 0.3 mV

## 2020-09-10 DIAGNOSIS — N959 Unspecified menopausal and perimenopausal disorder: Secondary | ICD-10-CM | POA: Diagnosis not present

## 2020-09-10 DIAGNOSIS — M8589 Other specified disorders of bone density and structure, multiple sites: Secondary | ICD-10-CM | POA: Diagnosis not present

## 2020-09-10 DIAGNOSIS — Z1231 Encounter for screening mammogram for malignant neoplasm of breast: Secondary | ICD-10-CM | POA: Diagnosis not present

## 2020-09-15 NOTE — Progress Notes (Signed)
Remote ICD transmission.   

## 2020-10-01 DIAGNOSIS — L578 Other skin changes due to chronic exposure to nonionizing radiation: Secondary | ICD-10-CM | POA: Diagnosis not present

## 2020-10-01 DIAGNOSIS — L820 Inflamed seborrheic keratosis: Secondary | ICD-10-CM | POA: Diagnosis not present

## 2020-10-01 DIAGNOSIS — L821 Other seborrheic keratosis: Secondary | ICD-10-CM | POA: Diagnosis not present

## 2020-10-04 DIAGNOSIS — I4891 Unspecified atrial fibrillation: Secondary | ICD-10-CM | POA: Diagnosis not present

## 2020-10-04 DIAGNOSIS — Z139 Encounter for screening, unspecified: Secondary | ICD-10-CM | POA: Diagnosis not present

## 2020-10-04 DIAGNOSIS — F339 Major depressive disorder, recurrent, unspecified: Secondary | ICD-10-CM | POA: Diagnosis not present

## 2020-10-04 DIAGNOSIS — M545 Low back pain, unspecified: Secondary | ICD-10-CM | POA: Diagnosis not present

## 2020-10-04 DIAGNOSIS — R739 Hyperglycemia, unspecified: Secondary | ICD-10-CM | POA: Diagnosis not present

## 2020-10-04 DIAGNOSIS — Z6828 Body mass index (BMI) 28.0-28.9, adult: Secondary | ICD-10-CM | POA: Diagnosis not present

## 2020-10-04 DIAGNOSIS — M539 Dorsopathy, unspecified: Secondary | ICD-10-CM | POA: Diagnosis not present

## 2020-10-04 DIAGNOSIS — Z95810 Presence of automatic (implantable) cardiac defibrillator: Secondary | ICD-10-CM | POA: Diagnosis not present

## 2020-10-04 DIAGNOSIS — E782 Mixed hyperlipidemia: Secondary | ICD-10-CM | POA: Diagnosis not present

## 2020-10-04 DIAGNOSIS — I2510 Atherosclerotic heart disease of native coronary artery without angina pectoris: Secondary | ICD-10-CM | POA: Diagnosis not present

## 2020-10-04 DIAGNOSIS — E559 Vitamin D deficiency, unspecified: Secondary | ICD-10-CM | POA: Diagnosis not present

## 2020-10-04 DIAGNOSIS — I10 Essential (primary) hypertension: Secondary | ICD-10-CM | POA: Diagnosis not present

## 2020-10-04 DIAGNOSIS — M1612 Unilateral primary osteoarthritis, left hip: Secondary | ICD-10-CM | POA: Diagnosis not present

## 2020-11-01 DIAGNOSIS — M1612 Unilateral primary osteoarthritis, left hip: Secondary | ICD-10-CM | POA: Diagnosis not present

## 2020-11-08 IMAGING — DX DG KNEE 1-2V PORT*R*
2 series · 2 of 2 positions shown · non-contrast
Comparison: None.

CLINICAL DATA: 72-year-old female post knee replacement. Initial
encounter.

EXAM:
PORTABLE RIGHT KNEE - 1-2 VIEW

[knee ap]
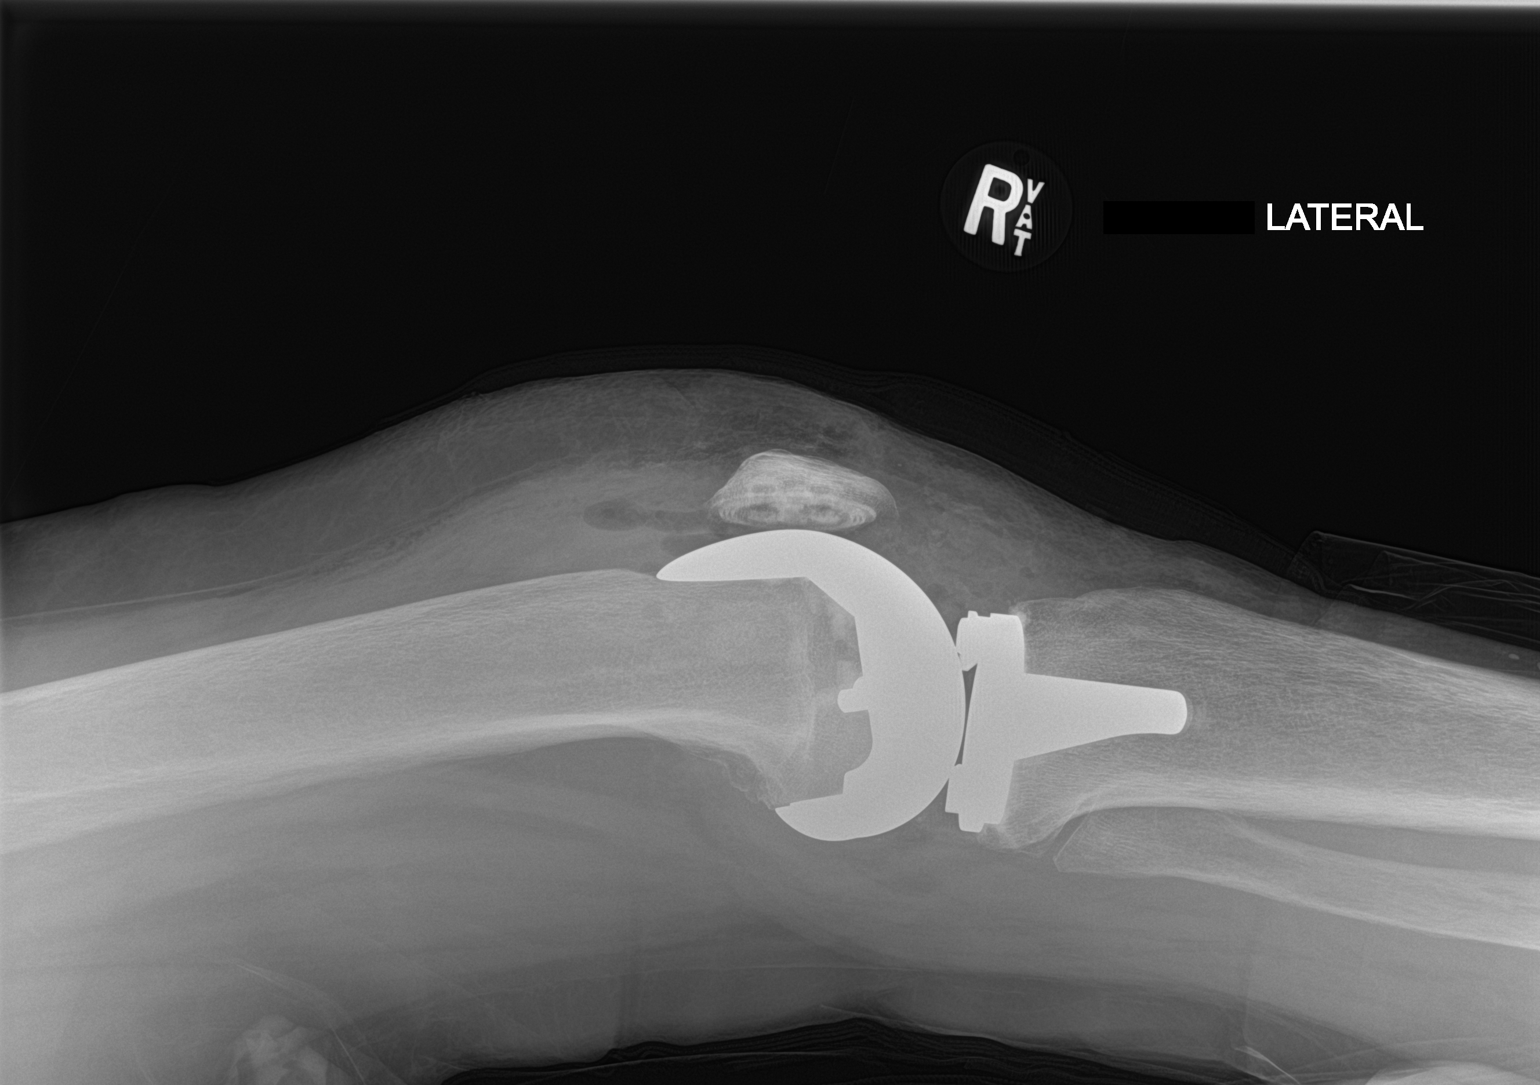

[knee lat]
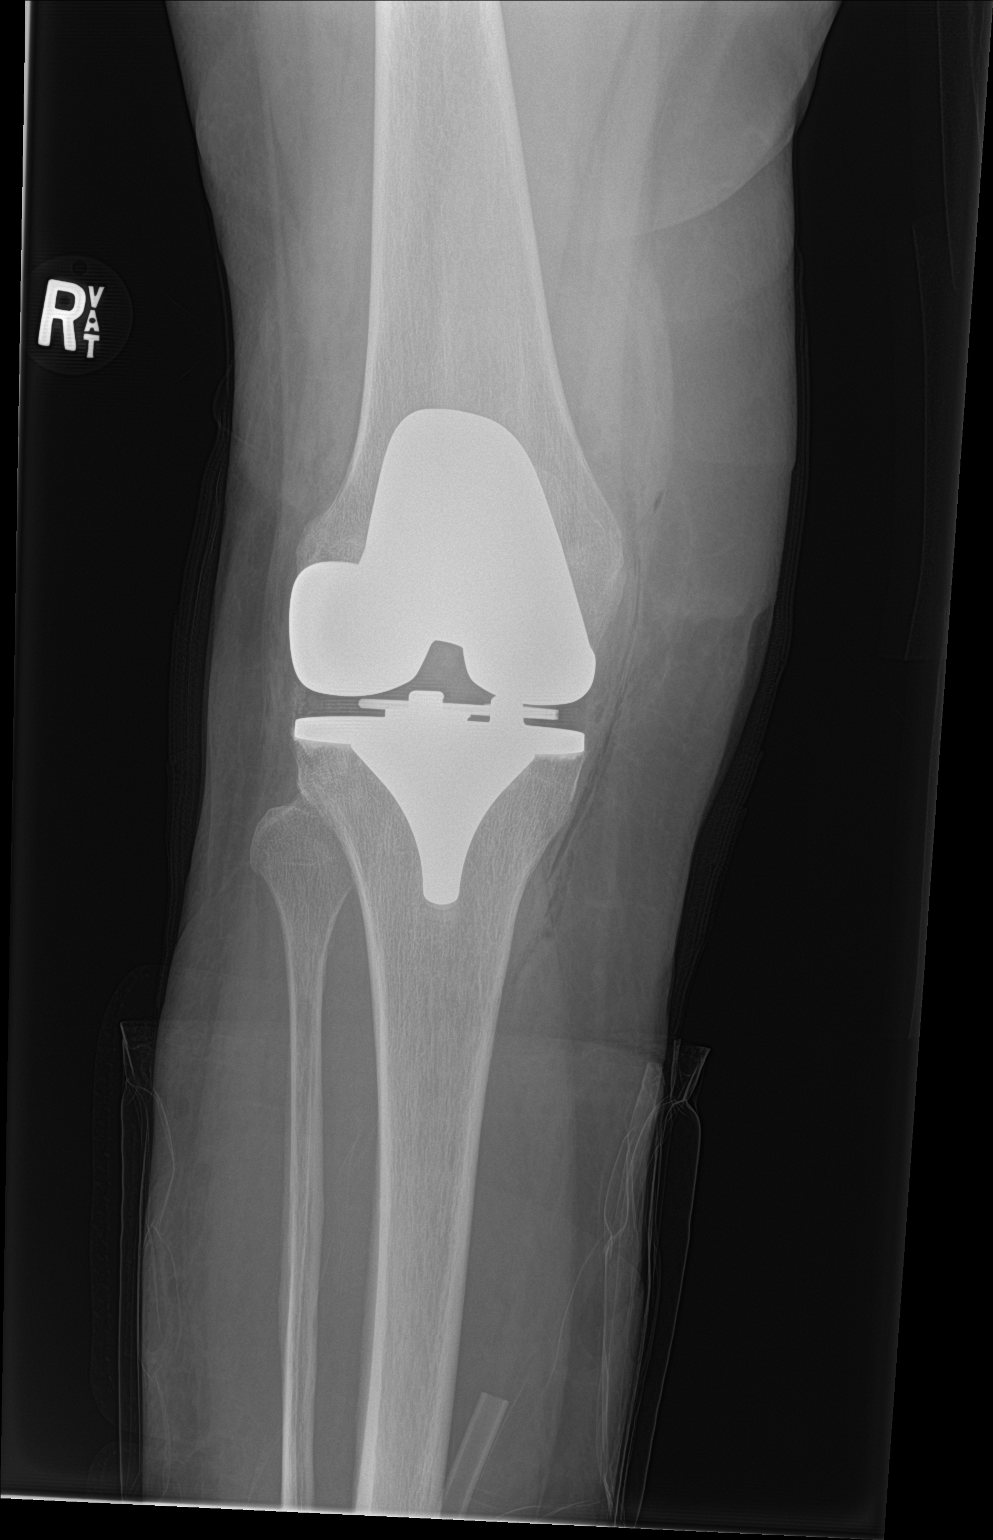

[2 of 2 positions shown; findings below may reference images not displayed]

FINDINGS: Post total right knee replacement which appears in satisfactory
position. Joint effusion.
IMPRESSION: Post total right knee replacement.

## 2020-11-16 DIAGNOSIS — Z23 Encounter for immunization: Secondary | ICD-10-CM | POA: Diagnosis not present

## 2020-12-07 ENCOUNTER — Ambulatory Visit (INDEPENDENT_AMBULATORY_CARE_PROVIDER_SITE_OTHER): Payer: Medicare Other

## 2020-12-07 DIAGNOSIS — I472 Ventricular tachycardia: Secondary | ICD-10-CM | POA: Diagnosis not present

## 2020-12-07 DIAGNOSIS — I4729 Other ventricular tachycardia: Secondary | ICD-10-CM

## 2020-12-07 LAB — CUP PACEART REMOTE DEVICE CHECK
Battery Remaining Longevity: 25 mo
Battery Voltage: 2.93 V
Brady Statistic AP VP Percent: 0.01 %
Brady Statistic AP VS Percent: 7.69 %
Brady Statistic AS VP Percent: 0.03 %
Brady Statistic AS VS Percent: 92.28 %
Brady Statistic RA Percent Paced: 7.66 %
Brady Statistic RV Percent Paced: 0.04 %
Date Time Interrogation Session: 20220531101313
HighPow Impedance: 52 Ohm
HighPow Impedance: 66 Ohm
Implantable Lead Implant Date: 20071003
Implantable Lead Implant Date: 20071019
Implantable Lead Location: 753859
Implantable Lead Location: 753860
Implantable Lead Model: 5076
Implantable Lead Model: 6947
Implantable Pulse Generator Implant Date: 20140729
Lead Channel Impedance Value: 304 Ohm
Lead Channel Impedance Value: 361 Ohm
Lead Channel Impedance Value: 475 Ohm
Lead Channel Pacing Threshold Amplitude: 0.5 V
Lead Channel Pacing Threshold Amplitude: 1.125 V
Lead Channel Pacing Threshold Pulse Width: 0.4 ms
Lead Channel Pacing Threshold Pulse Width: 0.4 ms
Lead Channel Sensing Intrinsic Amplitude: 2.5 mV
Lead Channel Sensing Intrinsic Amplitude: 2.5 mV
Lead Channel Sensing Intrinsic Amplitude: 7.625 mV
Lead Channel Sensing Intrinsic Amplitude: 7.625 mV
Lead Channel Setting Pacing Amplitude: 2 V
Lead Channel Setting Pacing Amplitude: 2.5 V
Lead Channel Setting Pacing Pulse Width: 0.4 ms
Lead Channel Setting Sensing Sensitivity: 0.3 mV

## 2020-12-09 DIAGNOSIS — M79671 Pain in right foot: Secondary | ICD-10-CM | POA: Diagnosis not present

## 2020-12-09 DIAGNOSIS — F339 Major depressive disorder, recurrent, unspecified: Secondary | ICD-10-CM | POA: Diagnosis not present

## 2020-12-09 DIAGNOSIS — M539 Dorsopathy, unspecified: Secondary | ICD-10-CM | POA: Diagnosis not present

## 2020-12-09 DIAGNOSIS — Z6828 Body mass index (BMI) 28.0-28.9, adult: Secondary | ICD-10-CM | POA: Diagnosis not present

## 2020-12-29 NOTE — Progress Notes (Signed)
Remote ICD transmission.   

## 2021-01-12 DIAGNOSIS — G43909 Migraine, unspecified, not intractable, without status migrainosus: Secondary | ICD-10-CM | POA: Diagnosis not present

## 2021-01-12 DIAGNOSIS — M79671 Pain in right foot: Secondary | ICD-10-CM | POA: Diagnosis not present

## 2021-01-12 DIAGNOSIS — F339 Major depressive disorder, recurrent, unspecified: Secondary | ICD-10-CM | POA: Diagnosis not present

## 2021-02-17 DIAGNOSIS — R739 Hyperglycemia, unspecified: Secondary | ICD-10-CM | POA: Diagnosis not present

## 2021-02-17 DIAGNOSIS — I4891 Unspecified atrial fibrillation: Secondary | ICD-10-CM | POA: Diagnosis not present

## 2021-02-17 DIAGNOSIS — I2510 Atherosclerotic heart disease of native coronary artery without angina pectoris: Secondary | ICD-10-CM | POA: Diagnosis not present

## 2021-02-17 DIAGNOSIS — Z79899 Other long term (current) drug therapy: Secondary | ICD-10-CM | POA: Diagnosis not present

## 2021-02-17 DIAGNOSIS — R232 Flushing: Secondary | ICD-10-CM | POA: Diagnosis not present

## 2021-02-17 DIAGNOSIS — I10 Essential (primary) hypertension: Secondary | ICD-10-CM | POA: Diagnosis not present

## 2021-02-17 DIAGNOSIS — Z95810 Presence of automatic (implantable) cardiac defibrillator: Secondary | ICD-10-CM | POA: Diagnosis not present

## 2021-02-17 DIAGNOSIS — F339 Major depressive disorder, recurrent, unspecified: Secondary | ICD-10-CM | POA: Diagnosis not present

## 2021-02-17 DIAGNOSIS — M539 Dorsopathy, unspecified: Secondary | ICD-10-CM | POA: Diagnosis not present

## 2021-02-17 DIAGNOSIS — E559 Vitamin D deficiency, unspecified: Secondary | ICD-10-CM | POA: Diagnosis not present

## 2021-02-17 DIAGNOSIS — M7671 Peroneal tendinitis, right leg: Secondary | ICD-10-CM | POA: Diagnosis not present

## 2021-02-17 DIAGNOSIS — E782 Mixed hyperlipidemia: Secondary | ICD-10-CM | POA: Diagnosis not present

## 2021-03-03 DIAGNOSIS — H02839 Dermatochalasis of unspecified eye, unspecified eyelid: Secondary | ICD-10-CM | POA: Diagnosis not present

## 2021-03-03 DIAGNOSIS — H43813 Vitreous degeneration, bilateral: Secondary | ICD-10-CM | POA: Diagnosis not present

## 2021-03-03 DIAGNOSIS — H532 Diplopia: Secondary | ICD-10-CM | POA: Diagnosis not present

## 2021-03-03 DIAGNOSIS — H26493 Other secondary cataract, bilateral: Secondary | ICD-10-CM | POA: Diagnosis not present

## 2021-03-03 DIAGNOSIS — Z961 Presence of intraocular lens: Secondary | ICD-10-CM | POA: Diagnosis not present

## 2021-03-08 ENCOUNTER — Ambulatory Visit (INDEPENDENT_AMBULATORY_CARE_PROVIDER_SITE_OTHER): Payer: Medicare Other

## 2021-03-08 DIAGNOSIS — I472 Ventricular tachycardia: Secondary | ICD-10-CM

## 2021-03-08 DIAGNOSIS — I4729 Other ventricular tachycardia: Secondary | ICD-10-CM

## 2021-03-08 DIAGNOSIS — I255 Ischemic cardiomyopathy: Secondary | ICD-10-CM

## 2021-03-08 LAB — CUP PACEART REMOTE DEVICE CHECK
Battery Remaining Longevity: 23 mo
Battery Voltage: 2.91 V
Brady Statistic AP VP Percent: 0.01 %
Brady Statistic AP VS Percent: 11.82 %
Brady Statistic AS VP Percent: 0.03 %
Brady Statistic AS VS Percent: 88.14 %
Brady Statistic RA Percent Paced: 11.76 %
Brady Statistic RV Percent Paced: 0.04 %
Date Time Interrogation Session: 20220830114942
HighPow Impedance: 50 Ohm
HighPow Impedance: 63 Ohm
Implantable Lead Implant Date: 20071003
Implantable Lead Implant Date: 20071019
Implantable Lead Location: 753859
Implantable Lead Location: 753860
Implantable Lead Model: 5076
Implantable Lead Model: 6947
Implantable Pulse Generator Implant Date: 20140729
Lead Channel Impedance Value: 304 Ohm
Lead Channel Impedance Value: 361 Ohm
Lead Channel Impedance Value: 456 Ohm
Lead Channel Pacing Threshold Amplitude: 0.5 V
Lead Channel Pacing Threshold Amplitude: 1.125 V
Lead Channel Pacing Threshold Pulse Width: 0.4 ms
Lead Channel Pacing Threshold Pulse Width: 0.4 ms
Lead Channel Sensing Intrinsic Amplitude: 3.75 mV
Lead Channel Sensing Intrinsic Amplitude: 3.75 mV
Lead Channel Sensing Intrinsic Amplitude: 7.125 mV
Lead Channel Sensing Intrinsic Amplitude: 7.125 mV
Lead Channel Setting Pacing Amplitude: 2 V
Lead Channel Setting Pacing Amplitude: 2.5 V
Lead Channel Setting Pacing Pulse Width: 0.4 ms
Lead Channel Setting Sensing Sensitivity: 0.3 mV

## 2021-03-11 ENCOUNTER — Telehealth: Payer: Self-pay | Admitting: *Deleted

## 2021-03-11 DIAGNOSIS — M1612 Unilateral primary osteoarthritis, left hip: Secondary | ICD-10-CM | POA: Diagnosis not present

## 2021-03-11 NOTE — Telephone Encounter (Signed)
Patient with diagnosis of afib on Eliquis for anticoagulation.    Procedure:  LEFT TOTAL HIP REPLACEMENT Date of procedure: TBD  CHA2DS2-VASc Score = 5   This indicates a 7.2% annual risk of stroke. The patient's score is based upon: CHF History: Yes HTN History: No Diabetes History: No Stroke History: No Vascular Disease History: Yes Age Score: 2 Gender Score: 1    CrCl 67 ml/min  Patient has a history of recurrent VTE (DVT in 2007, unprovoked PE in 2012), also has family history of blood clots which her father died from. She was previously bridged with Lovenox by Dr Lindi Adie when she held her Eliquis for a few days in 2019.  She was cleared in May 2021 by Dr. Lovena Le to hold 3 days without a bridge.  Per office protocol, patient can hold Eliquis for 3 days prior to procedure.    Eliquis should be resumed as soon as safely possible after procedure.

## 2021-03-11 NOTE — Telephone Encounter (Signed)
   [facility] Pre-operative Risk Assessment    Patient Name: Rachel Bates  DOB: Jun 30, 1946 MRN: [id number]  HEARTCARE STAFF:  - IMPORTANT!!!!!! Under Visit Info/Reason for Call, type in Other and utilize the format Clearance MM/DD/YY or Clearance TBD. Do not use dashes or single digits. - Please review there is not already an duplicate clearance open for this procedure. - If request is for dental extraction, please clarify the # of teeth to be extracted. - If the patient is currently at the dentist's office, call Pre-Op Callback Staff (MA/nurse) to input urgent request.  - If the patient is not currently in the dentist office, please route to the Pre-Op pool.  Request for surgical clearance:  What type of surgery is being performed? LEFT TOTAL HIP REPLACEMENT  When is this surgery scheduled? TBD  What type of clearance is required (medical clearance vs. Pharmacy clearance to hold med vs. Both)? BOTH  Are there any medications that need to be held prior to surgery and how long?  [place] name and name of physician performing surgery? MURPHY WAINER; DR. Marchia Bond  What is the office phone number? [phone]   7.   What is the office fax number? [phone]  8.   Anesthesia type (None, local, MAC, general) ? SPINAL   Julaine Hua 03/11/2021, 1:59 PM  _________________________________________________________________   (provider comments below)

## 2021-03-15 NOTE — Telephone Encounter (Signed)
Left VM

## 2021-03-15 NOTE — Telephone Encounter (Signed)
Patient was returning call 

## 2021-03-16 NOTE — Telephone Encounter (Signed)
   Name: Rachel Bates  DOB: [birth year]  MRN: [id number]   Primary Cardiologist: Cristopher Peru, MD  Chart reviewed as part of pre-operative protocol coverage. Patient was contacted 03/16/2021 in reference to pre-operative risk assessment for pending surgery as outlined below.  Rachel Bates was last seen on 05/25/20 by Dr. Lovena Le.  Since that day, Rachel Bates has done well. She is primarily limited by hip and knee pain, but can complete 4.0 METS (moderate house work, groceries).  Per our clinical pharmacist:  She was cleared in May 2021 by Dr. Lovena Le to hold 3 days without a bridge.   Per office protocol, patient can hold Eliquis for 3 days prior to procedure.     Eliquis should be resumed as soon as safely possible after procedure.  Therefore, based on ACC/AHA guidelines, the patient would be at acceptable risk for the planned procedure without further cardiovascular testing.   The patient was advised that if she develops new symptoms prior to surgery to contact our office to arrange for a follow-up visit, and she verbalized understanding.  I will route this recommendation to the requesting party via Epic fax function and remove from pre-op pool. Please call with questions.  [place], PA 03/16/2021, 8:44 AM

## 2021-03-21 NOTE — Progress Notes (Signed)
Remote ICD transmission.   

## 2021-03-25 DIAGNOSIS — Z23 Encounter for immunization: Secondary | ICD-10-CM | POA: Diagnosis not present

## 2021-03-30 ENCOUNTER — Encounter: Payer: Self-pay | Admitting: Internal Medicine

## 2021-03-30 NOTE — Patient Instructions (Addendum)
DUE TO COVID-19 ONLY ONE VISITOR IS ALLOWED TO COME WITH YOU AND STAY IN THE WAITING ROOM ONLY DURING PRE OP AND PROCEDURE.   **NO VISITORS ARE ALLOWED IN THE SHORT STAY AREA OR RECOVERY ROOM!!**  IF YOU WILL BE ADMITTED INTO THE HOSPITAL YOU ARE ALLOWED ONLY TWO SUPPORT PEOPLE DURING VISITATION HOURS ONLY (10AM -8PM)   The support person(s) may change daily. The support person(s) must pass our screening, gel in and out, and wear a mask at all times, including in the patient's room. Patients must also wear a mask when staff or their support person are in the room.  No visitors under the age of 70. Any visitor under the age of 34 must be accompanied by an adult.    COVID SWAB TESTING MUST BE COMPLETED ON:  04/15/21 **MUST PRESENT COMPLETED FORM AT TESTING SITE**    [facility] [place] (backside of the building) No appointment needed. Open 8am-3pm You are not required to quarantine, however you are required to wear a well-fitted mask when you are out and around people not in your household.  Hand Hygiene often Do NOT share personal items Notify your provider if you are in close contact with someone who has COVID or you develop fever 100.4 or greater, new onset of sneezing, cough, sore throat, shortness of breath or body aches.   Your procedure is scheduled on: 04/19/21   Report to [facility] Main Entrance    Report to admitting at 11:00 AM   Call this number if you have problems the morning of surgery [phone]   Do not eat food :After Midnight.   May have liquids until 10:45 AM day of surgery  CLEAR LIQUID DIET  Foods Allowed                                                                     Foods Excluded  Water, Black Coffee and tea b(no milk or creamer)        liquids that you cannot  Plain Jell-O in any flavor  (No red)                                   see through such as: Fruit ices (not with fruit pulp)                                            milk, soups, orange juice              Iced Popsicles (No red)                                              All solid food                                   Apple juices Sports drinks like Gatorade (No red) Lightly seasoned  clear broth or consume(fat free) Sugar    The day of surgery:  Drink ONE (1) Pre-Surgery Clear Ensure by 10:45 am the morning of surgery. Drink in one sitting. Do not sip.  This drink was given to you during your hospital  pre-op appointment visit. Nothing else to drink after completing the  Pre-Surgery Clear Ensure.          If you have questions, please contact your surgeon's office.     Oral Hygiene is also important to reduce your risk of infection.                                    Remember - BRUSH YOUR TEETH THE MORNING OF SURGERY WITH YOUR REGULAR TOOTHPASTE   Take these medicines the morning of surgery with A SIP OF WATER: Tylenol, Carvedilol, Gabapentin, Effexor, Zonegran                              You may not have any metal on your body including hair pins, jewelry, and body piercing             Do not wear make-up, lotions, powders, perfumes, or deodorant  Do not wear nail polish including gel and S&S, artificial/acrylic nails, or any other type of covering on natural nails including finger and toenails. If you have artificial nails, gel coating, etc. that needs to be removed by a nail salon please have this removed prior to surgery or surgery may need to be canceled/ delayed if the surgeon/ anesthesia feels like they are unable to be safely monitored.   Do not shave  48 hours prior to surgery.    Do not bring valuables to the hospital. [place].   Contacts, dentures or bridgework may not be worn into surgery.   Bring small overnight bag day of surgery.   Special Instructions: Bring a copy of your healthcare power of attorney and living will documents         the day of surgery if you haven't  scanned them before.  Please read over the following fact sheets you were given: IF YOU HAVE QUESTIONS ABOUT YOUR PRE-OP INSTRUCTIONS PLEASE CALL [place] [facility] - Preparing for Surgery Before surgery, you can play an important role.  Because skin is not sterile, your skin needs to be as free of germs as possible.  You can reduce the number of germs on your skin by washing with CHG (chlorahexidine gluconate) soap before surgery.  CHG is an antiseptic cleaner which kills germs and bonds with the skin to continue killing germs even after washing. Please DO NOT use if you have an allergy to CHG or antibacterial soaps.  If your skin becomes reddened/irritated stop using the CHG and inform your nurse when you arrive at Short Stay. Do not shave (including legs and underarms) for at least 48 hours prior to the first CHG shower.  You may shave your face/neck.  Please follow these instructions carefully:  1.  Shower with CHG Soap the night before surgery and the  morning of surgery.  2.  If you choose to wash your hair, wash your hair first as usual with your normal  shampoo.  3.  After you shampoo, rinse  your hair and body thoroughly to remove the shampoo.                             4.  Use CHG as you would any other liquid soap.  You can apply chg directly to the skin and wash.  Gently with a scrungie or clean washcloth.  5.  Apply the CHG Soap to your body ONLY FROM THE NECK DOWN.   Do   not use on face/ open                           Wound or open sores. Avoid contact with eyes, ears mouth and   genitals (private parts).                       Wash face,  Genitals (private parts) with your normal soap.             6.  Wash thoroughly, paying special attention to the area where your    surgery  will be performed.  7.  Thoroughly rinse your body with warm water from the neck down.  8.  DO NOT shower/wash with your normal soap after using and rinsing off the CHG Soap.                9.   Pat yourself dry with a clean towel.            10.  Wear clean pajamas.            11.  Place clean sheets on your bed the night of your first shower and do not  sleep with pets. Day of Surgery : Do not apply any lotions/deodorants the morning of surgery.  Please wear clean clothes to the hospital/surgery center.  FAILURE TO FOLLOW THESE INSTRUCTIONS MAY RESULT IN THE CANCELLATION OF YOUR SURGERY  PATIENT SIGNATURE_________________________________  NURSE SIGNATURE__________________________________  ________________________________________________________________________   Rachel Bates  An incentive spirometer is a tool that can help keep your lungs clear and active. This tool measures how well you are filling your lungs with each breath. Taking long deep breaths may help reverse or decrease the chance of developing breathing (pulmonary) problems (especially infection) following: A long period of time when you are unable to move or be active. BEFORE THE PROCEDURE  If the spirometer includes an indicator to show your best effort, your nurse or respiratory therapist will set it to a desired goal. If possible, sit up straight or lean slightly forward. Try not to slouch. Hold the incentive spirometer in an upright position. INSTRUCTIONS FOR USE  Sit on the edge of your bed if possible, or sit up as far as you can in bed or on a chair. Hold the incentive spirometer in an upright position. Breathe out normally. Place the mouthpiece in your mouth and seal your lips tightly around it. Breathe in slowly and as deeply as possible, raising the piston or the ball toward the top of the column. Hold your breath for 3-5 seconds or for as long as possible. Allow the piston or ball to fall to the bottom of the column. Remove the mouthpiece from your mouth and breathe out normally. Rest for a few seconds and repeat Steps 1 through 7 at least 10 times every 1-2 hours when you are awake. Take your  time and take a few normal breaths between  deep breaths. The spirometer may include an indicator to show your best effort. Use the indicator as a goal to work toward during each repetition. After each set of 10 deep breaths, practice coughing to be sure your lungs are clear. If you have an incision (the cut made at the time of surgery), support your incision when coughing by placing a pillow or rolled up towels firmly against it. Once you are able to get out of bed, walk around indoors and cough well. You may stop using the incentive spirometer when instructed by your caregiver.  RISKS AND COMPLICATIONS Take your time so you do not get dizzy or light-headed. If you are in pain, you may need to take or ask for pain medication before doing incentive spirometry. It is harder to take a deep breath if you are having pain. AFTER USE Rest and breathe slowly and easily. It can be helpful to keep track of a log of your progress. Your caregiver can provide you with a simple table to help with this. If you are using the spirometer at home, follow these instructions: [place] IF:  You are having difficultly using the spirometer. You have trouble using the spirometer as often as instructed. Your pain medication is not giving enough relief while using the spirometer. You develop fever of 100.5 F (38.1 C) or higher. SEEK IMMEDIATE MEDICAL CARE IF:  You cough up bloody sputum that had not been present before. You develop fever of 102 F (38.9 C) or greater. You develop worsening pain at or near the incision site. MAKE SURE YOU:  Understand these instructions. Will watch your condition. Will get help right away if you are not doing well or get worse. Document Released: 11/06/2006 Document Revised: 09/18/2011 Document Reviewed: 01/07/2007 [facility] Patient Information 2014 [place], [place].   ________________________________________________________________________

## 2021-03-30 NOTE — Progress Notes (Addendum)
COVID swab appointment: 04/15/21  COVID Vaccine Completed: yes x4 Date COVID Vaccine completed: 08/14/19, 09/11/19 Has received booster: 05/14/20, 11/16/20 COVID vaccine manufacturer: Moderna    Date of COVID positive in last 90 days: No  PCP - Ihor Dow, PA Cardiologist - Cristopher Peru, MD  Cardiac clearance by Fabian Sharp 03/16/21, Epic  Chest x-ray - N/a EKG - 05/25/20 Epic Stress Test - long time ago per pt ECHO - long time ago per pt Cardiac Cath - yes has defibrillator  Pacemaker/ICD device last checked: 03/08/21 Spinal Cord Stimulator: N/a  Sleep Study - yes, negative for sleep apnea CPAP -   Fasting Blood Sugar - N/a Checks Blood Sugar _____ times a day  Blood Thinner Instructions: Eliquis Aspirin Instructions: Last Dose: hold 3 days,   Activity level: Can go up a flight of stairs and perform activities of daily living without stopping and without symptoms of chest pain. Endorses SOB after getting up stairs. Has trouble getting upstairs due to hip and knee pain       Anesthesia review: MI, cardiomyopathy, OSA, CAD, A fib, CHF, PE  Patient denies shortness of breath, fever, cough and chest pain at PAT appointment   Patient verbalized understanding of instructions that were given to them at the PAT appointment. Patient was also instructed that they will need to review over the PAT instructions again at home before surgery.

## 2021-03-30 NOTE — Progress Notes (Signed)
PERIOPERATIVE PRESCRIPTION FOR IMPLANTED CARDIAC DEVICE PROGRAMMING  Patient Information: Name:  Rachel Bates  DOB:  03/09/1946  MRN:  [id number]     Planned Procedure:  left total hip arthroplasty  Surgeon:  Dr. Mardelle Matte  Date of Procedure:  04/19/21  Cautery will be used.  Position during surgery:  unknown   Please send documentation back to:  [facility] (Fax # [phone])       Device Information:  Clinic EP Physician:  Cristopher Peru, MD   Device Type:  Pacemaker and Defibrillator  Medtronic [id number] Rachel Bates DR Manufacturer and Phone #:  Medtronic: [id number] Pacemaker Dependent?:  No. Date of Last Device Check:  03/08/21 remote Normal Device Function?:  Yes.    Electrophysiologist's Recommendations:  Have magnet available. Provide continuous ECG monitoring when magnet is used or reprogramming is to be performed.  Procedure should not interfere with device function.  No device programming or magnet placement needed.  Per Device Clinic Standing Orders, Rachel Bates [place], [place]  1:33 PM 03/30/2021

## 2021-03-31 ENCOUNTER — Encounter (HOSPITAL_COMMUNITY): Payer: Self-pay

## 2021-03-31 ENCOUNTER — Encounter (HOSPITAL_COMMUNITY)
Admission: RE | Admit: 2021-03-31 | Discharge: 2021-03-31 | Disposition: A | Discharge status: Home / Self Care | Payer: Medicare Other | Source: Ambulatory Visit | Attending: Orthopedic Surgery | Admitting: Orthopedic Surgery

## 2021-03-31 ENCOUNTER — Other Ambulatory Visit: Payer: Self-pay

## 2021-03-31 DIAGNOSIS — Z95810 Presence of automatic (implantable) cardiac defibrillator: Secondary | ICD-10-CM | POA: Diagnosis not present

## 2021-03-31 DIAGNOSIS — I480 Paroxysmal atrial fibrillation: Secondary | ICD-10-CM | POA: Insufficient documentation

## 2021-03-31 DIAGNOSIS — I10 Essential (primary) hypertension: Secondary | ICD-10-CM | POA: Diagnosis not present

## 2021-03-31 DIAGNOSIS — Z87891 Personal history of nicotine dependence: Secondary | ICD-10-CM | POA: Diagnosis not present

## 2021-03-31 DIAGNOSIS — I2510 Atherosclerotic heart disease of native coronary artery without angina pectoris: Secondary | ICD-10-CM | POA: Diagnosis not present

## 2021-03-31 DIAGNOSIS — M1612 Unilateral primary osteoarthritis, left hip: Secondary | ICD-10-CM | POA: Insufficient documentation

## 2021-03-31 DIAGNOSIS — Z01812 Encounter for preprocedural laboratory examination: Secondary | ICD-10-CM | POA: Diagnosis not present

## 2021-03-31 DIAGNOSIS — Z7901 Long term (current) use of anticoagulants: Secondary | ICD-10-CM | POA: Insufficient documentation

## 2021-03-31 DIAGNOSIS — Z86718 Personal history of other venous thrombosis and embolism: Secondary | ICD-10-CM | POA: Diagnosis not present

## 2021-03-31 DIAGNOSIS — Z86711 Personal history of pulmonary embolism: Secondary | ICD-10-CM | POA: Insufficient documentation

## 2021-03-31 DIAGNOSIS — Z79899 Other long term (current) drug therapy: Secondary | ICD-10-CM | POA: Insufficient documentation

## 2021-03-31 HISTORY — DX: Other intervertebral disc degeneration, lumbar region: M51.36

## 2021-03-31 HISTORY — DX: Other intervertebral disc degeneration, lumbar region without mention of lumbar back pain or lower extremity pain: M51.369

## 2021-03-31 LAB — CBC
HCT: 43 % (ref 36.0–46.0)
Hemoglobin: 14 g/dL (ref 12.0–15.0)
MCH: 29.4 pg (ref 26.0–34.0)
MCHC: 32.6 g/dL (ref 30.0–36.0)
MCV: 90.1 fL (ref 80.0–100.0)
Platelets: 111 10*3/uL — ABNORMAL LOW (ref 150–400)
RBC: 4.77 MIL/uL (ref 3.87–5.11)
RDW: 14 % (ref 11.5–15.5)
WBC: 3.8 10*3/uL — ABNORMAL LOW (ref 4.0–10.5)
nRBC: 0 % (ref 0.0–0.2)

## 2021-03-31 LAB — BASIC METABOLIC PANEL
Anion gap: 6 (ref 5–15)
BUN: 13 mg/dL (ref 8–23)
CO2: 27 mmol/L (ref 22–32)
Calcium: 9.2 mg/dL (ref 8.9–10.3)
Chloride: 105 mmol/L (ref 98–111)
Creatinine, Ser: 0.72 mg/dL (ref 0.44–1.00)
GFR, Estimated: >60 mL/min (ref >60)
Glucose, Bld: 92 mg/dL (ref 70–99)
Potassium: 4 mmol/L (ref 3.5–5.1)
Sodium: 138 mmol/L (ref 135–145)

## 2021-03-31 LAB — SURGICAL PCR SCREEN
MRSA, PCR: NEGATIVE
Staphylococcus aureus: POSITIVE — AB

## 2021-04-01 NOTE — Anesthesia Preprocedure Evaluation (Addendum)
Anesthesia Evaluation  Patient identified by MRN, date of birth, ID band Patient awake    Reviewed: Allergy & Precautions, NPO status , Patient's Chart, lab work & pertinent test results  Airway Mallampati: III  TM Distance: >3 FB Neck ROM: Full    Dental  (+) Edentulous Upper, Edentulous Lower   Pulmonary former smoker, PE   Pulmonary exam normal breath sounds clear to auscultation       Cardiovascular + CAD, + Past MI, + Cardiac Stents, +CHF and + DVT  Normal cardiovascular exam+ dysrhythmias Atrial Fibrillation + Cardiac Defibrillator  Rhythm:Regular Rate:Normal  ECG: NSR, rate 82   Neuro/Psych  Headaches, PSYCHIATRIC DISORDERS Anxiety Depression    GI/Hepatic negative GI ROS, Neg liver ROS,   Endo/Other  negative endocrine ROS  Renal/GU negative Renal ROS     Musculoskeletal  (+) Arthritis ,   Abdominal   Peds  Hematology  (+) Blood dyscrasia, , HLD   Anesthesia Other Findings DJD LEFT HIP  Reproductive/Obstetrics                           Anesthesia Physical Anesthesia Plan  ASA: 3  Anesthesia Plan: General   Post-op Pain Management:    Induction: Intravenous  PONV Risk Score and Plan: 3 and Ondansetron, Dexamethasone and Treatment may vary due to age or medical condition  Airway Management Planned: Oral ETT  Additional Equipment:   Intra-op Plan:   Post-operative Plan: Extubation in OR  Informed Consent: I have reviewed the patients History and Physical, chart, labs and discussed the procedure including the risks, benefits and alternatives for the proposed anesthesia with the patient or authorized representative who has indicated his/her understanding and acceptance.       Plan Discussed with: CRNA  Anesthesia Plan Comments: (Reviewed PAT note 03/31/21, Konrad Felix Ward, PA-C)      Anesthesia Quick Evaluation

## 2021-04-01 NOTE — Progress Notes (Signed)
Anesthesia Chart Review   Case: [id number] Date/Time: 04/19/21 1330   Procedure: TOTAL HIP ARTHROPLASTY (Left: Hip)   Anesthesia type: Choice   Pre-op diagnosis: DJD LEFT HIP   Location: [facility] ROOM 07 / [facility]   Surgeons: Marchia Bond, MD       DISCUSSION:75 y.o. former smoker with h/o HTN, OSA, AICD in place (device orders in progress note 03/30/21), CAD, PAF, DVT, PE, left hip djd scheduled for above procedure 04/19/2021 with Dr. Marchia Bond.   Per cardiology preoperative evaluation 03/16/21, "Chart reviewed as part of pre-operative protocol coverage. Patient was contacted 03/16/2021 in reference to pre-operative risk assessment for pending surgery as outlined below.  Rachel Bates was last seen on 05/25/20 by Dr. Lovena Le.  Since that day, Rachel Bates has done well. She is primarily limited by hip and knee pain, but can complete 4.0 METS (moderate house work, groceries).   Per our clinical pharmacist:  She was cleared in May 2021 by Dr. Lovena Le to hold 3 days without a bridge. Per office protocol, patient can hold Eliquis for 3 days prior to procedure.   Eliquis should be resumed as soon as safely possible after procedure. Therefore, based on ACC/AHA guidelines, the patient would be at acceptable risk for the planned procedure without further cardiovascular testing. "  Anticipate pt can proceed with planned procedure barring acute status change.   VS: BP 117/68   Pulse 77   Temp 36.9 C (Oral)   Resp 16   Ht 5\' 7"  (1.702 m)   SpO2 96%   BMI 29.35 kg/m   PROVIDERS: Renaldo Reel, PA is PCP   Cristopher Peru, MD is Cardiologist  LABS: Labs reviewed: Acceptable for surgery. (all labs ordered are listed, but only abnormal results are displayed)  Labs Reviewed  SURGICAL PCR SCREEN - Abnormal; Notable for the following components:      Result Value   Staphylococcus aureus POSITIVE (*)    All other components within normal limits  CBC - Abnormal; Notable for the following  components:   WBC 3.8 (*)    Platelets 111 (*)    All other components within normal limits  BASIC METABOLIC PANEL     IMAGES:   EKG: 05/25/2020 Rate 82 bpm  NSR  CV:  Past Medical History:  Diagnosis Date   AICD (automatic cardioverter/defibrillator) present dual   Medtronic ---  original placedment 2007/  generator change 2014 by dr Carleene Overlie taylor   Anemia    Anticoagulant long-term use    eliquis   Anxiety    Arthralgia of multiple joints    Arthritis pain    Benign hypertensive heart disease    CAD (coronary artery disease) primary cardiologist-- dr gregg taylor   MI and 2 stents 1995 in [place]   CHF NYHA class II, chronic, systolic ([place])    followed by dr Carleene Overlie taylor   Degenerative disc disease, lumbar    Depression    Dyslipidemia    History of basal cell carcinoma (BCC) excision    right ankle area s/p  excision in office 06/ 2019  in office   History of DVT of lower extremity yrs ago before 2012   History of MI (myocardial infarction) 1995  in [place]   History of pulmonary embolus (PE) 2012   History of ventricular tachycardia    Hyperlipidemia    Hypersomnia    Ischemic cardiomyopathy    Migraines    OA (osteoarthritis)    "all over"  Obstructive apnea    PAF (paroxysmal atrial fibrillation) (HCC)    Primary localized osteoarthritis of right hip 12/18/2017   Primary localized osteoarthritis of right knee 05/28/2018   S/P coronary artery stent placement 1995   in [facility]   05-21-2018 per pt x2  stents in same coronary artery (unsure BM or DES)   Vitamin D deficiency disease     Past Surgical History:  Procedure Laterality Date   CARDIAC DEFIBRILLATOR PLACEMENT  2007   CATARACT EXTRACTION, BILATERAL     COLONOSCOPY  04/14/2013   colonic polyp, status post polypectomy. Mild panocolonic diverticulosis. Small internal hemorrhoids   DILATION AND CURETTAGE OF UTERUS  yrs ago   IMPLANTABLE CARDIOVERTER DEFIBRILLATOR GENERATOR CHANGE N/A 02/04/2013    Procedure: IMPLANTABLE CARDIOVERTER DEFIBRILLATOR GENERATOR CHANGE;  Surgeon: Evans Lance, MD;  [facility] C[facility] LAB;  Service: Cardiovascular;  Laterality: N/A;   SHOULDER ARTHROSCOPY Right 2015   TOTAL HIP ARTHROPLASTY Right 12/18/2017   Procedure: RIGHT TOTAL HIP ARTHROPLASTY;  Surgeon: Marchia Bond, MD;  Location: [place];  Service: Orthopedics;  Laterality: Right;   TOTAL KNEE ARTHROPLASTY Right 05/28/2018   Procedure: TOTAL KNEE ARTHROPLASTY;  Surgeon: Marchia Bond, MD;  [facility];  Service: Orthopedics;  Laterality: Right;  Adductor Block   TUBAL LIGATION Bilateral yrs ago   WISDOM TOOTH EXTRACTION      MEDICATIONS:  acetaminophen (TYLENOL) 650 MG CR tablet   apixaban (ELIQUIS) 5 MG TABS tablet   b complex vitamins capsule   Calcium Carb-Cholecalciferol (CALCIUM 600 + D PO)   carvedilol (COREG) 12.5 MG tablet   Cholecalciferol (DIALYVITE VITAMIN D 5000) 125 MCG (5000 UT) capsule   Cyanocobalamin (B-12 PO)   diphenhydrAMINE (BENADRYL) 25 mg capsule   FIBER PO   Fiber POWD   furosemide (LASIX) 40 MG tablet   gabapentin (NEURONTIN) 300 MG capsule   Neomycin-Bacitracin-Polymyxin (TRIPLE ANTIBIOTIC) OINT   Omega-3 Fatty Acids (FISH OIL) 1000 MG CAPS   ramipril (ALTACE) 2.5 MG tablet   simvastatin (ZOCOR) 40 MG tablet   traZODone (DESYREL) 50 MG tablet   venlafaxine (EFFEXOR) 75 MG tablet   vitamin E 400 UNIT capsule   zonisamide (ZONEGRAN) 50 MG capsule   No current facility-administered medications for this encounter.    Konrad Felix Ward, PA-C [facility] [phone]

## 2021-04-04 ENCOUNTER — Telehealth: Payer: Self-pay

## 2021-04-04 DIAGNOSIS — M1612 Unilateral primary osteoarthritis, left hip: Secondary | ICD-10-CM | POA: Diagnosis not present

## 2021-04-04 NOTE — Telephone Encounter (Signed)
**Note De-Identified Rachel Bates [place] Obfuscation** The pt left her completed BMSPAF application for Elquis at the office with documents.   I have completed the providers page of her application and emailed all to Dr Tanna Furry nurse so she can obtain Dr Hassell Done (DOD) signature as Dr Lovena Le is out of he office this week, to date it, and to fax all to BMSPAF at the fax number written on the cover letter included or to lace in the to be faxed [place] Medical Records to be faxed.

## 2021-04-04 NOTE — Telephone Encounter (Signed)
Paper work completed and faxed.  Fax confirmation received.

## 2021-04-07 NOTE — Telephone Encounter (Signed)
**Note De-Identified Alese Furniss Obfuscation** Letter received Rachel Bates fax from [facility] stating that they denied the pt asst with Eliquis.  Reason for denial: Documentation of 3% out of pocket RX expenses, based on household adjusted gross income, not met.  BMSPAF Case #: [id number]  The letter states that they have also notified the pt of this denial as well.

## 2021-04-12 NOTE — Care Plan (Signed)
Ortho Bundle Case Management Note  Patient Details  Name: Rachel Bates MRN: [id number] Date of Birth: [birth year]    Met with patient in the office prior to surgery. She will discharge to home with family to assist. Has equipment at home. OPPT set up with [facility]. Patient and MD in agreement with this plan. Choice offered                 DME Arranged:    DME Agency:     HH Arranged:    HH Agency:     Additional Comments: Please contact me with any questions of if this plan should need to change.  Ladell Heads,  [place] Orthopaedic Specialist  [phone] 04/12/2021, 10:06 AM

## 2021-04-15 ENCOUNTER — Other Ambulatory Visit: Payer: Self-pay | Admitting: Orthopedic Surgery

## 2021-04-16 LAB — SARS CORONAVIRUS 2 (TAT 6-24 HRS): SARS Coronavirus 2: NEGATIVE

## 2021-04-19 ENCOUNTER — Encounter (HOSPITAL_COMMUNITY)
Admission: RE | Disposition: A | Discharge status: Home / Self Care | Payer: Self-pay | Source: Home / Self Care | Attending: Orthopedic Surgery

## 2021-04-19 ENCOUNTER — Encounter (HOSPITAL_COMMUNITY): Payer: Self-pay | Admitting: Orthopedic Surgery

## 2021-04-19 ENCOUNTER — Ambulatory Visit (HOSPITAL_COMMUNITY): Payer: Medicare Other | Admitting: Physician Assistant

## 2021-04-19 ENCOUNTER — Ambulatory Visit (HOSPITAL_COMMUNITY)
Admission: RE | Admit: 2021-04-19 | Discharge: 2021-04-20 | Disposition: A | Discharge status: Home / Self Care | Payer: Medicare Other | Attending: Orthopedic Surgery | Admitting: Orthopedic Surgery

## 2021-04-19 ENCOUNTER — Ambulatory Visit (HOSPITAL_COMMUNITY): Payer: Medicare Other

## 2021-04-19 ENCOUNTER — Other Ambulatory Visit: Payer: Self-pay

## 2021-04-19 ENCOUNTER — Ambulatory Visit (HOSPITAL_COMMUNITY): Payer: Medicare Other | Admitting: Certified Registered"

## 2021-04-19 DIAGNOSIS — Z96642 Presence of left artificial hip joint: Secondary | ICD-10-CM | POA: Diagnosis not present

## 2021-04-19 DIAGNOSIS — I5022 Chronic systolic (congestive) heart failure: Secondary | ICD-10-CM | POA: Diagnosis not present

## 2021-04-19 DIAGNOSIS — M1612 Unilateral primary osteoarthritis, left hip: Secondary | ICD-10-CM | POA: Insufficient documentation

## 2021-04-19 DIAGNOSIS — Z95810 Presence of automatic (implantable) cardiac defibrillator: Secondary | ICD-10-CM | POA: Diagnosis not present

## 2021-04-19 DIAGNOSIS — Z86711 Personal history of pulmonary embolism: Secondary | ICD-10-CM | POA: Diagnosis not present

## 2021-04-19 DIAGNOSIS — Z86718 Personal history of other venous thrombosis and embolism: Secondary | ICD-10-CM | POA: Insufficient documentation

## 2021-04-19 DIAGNOSIS — I480 Paroxysmal atrial fibrillation: Secondary | ICD-10-CM | POA: Diagnosis not present

## 2021-04-19 DIAGNOSIS — I252 Old myocardial infarction: Secondary | ICD-10-CM | POA: Insufficient documentation

## 2021-04-19 DIAGNOSIS — E785 Hyperlipidemia, unspecified: Secondary | ICD-10-CM | POA: Diagnosis not present

## 2021-04-19 DIAGNOSIS — Z96641 Presence of right artificial hip joint: Secondary | ICD-10-CM | POA: Diagnosis not present

## 2021-04-19 DIAGNOSIS — Z87891 Personal history of nicotine dependence: Secondary | ICD-10-CM | POA: Insufficient documentation

## 2021-04-19 DIAGNOSIS — Z955 Presence of coronary angioplasty implant and graft: Secondary | ICD-10-CM | POA: Diagnosis not present

## 2021-04-19 DIAGNOSIS — I2510 Atherosclerotic heart disease of native coronary artery without angina pectoris: Secondary | ICD-10-CM | POA: Insufficient documentation

## 2021-04-19 DIAGNOSIS — I110 Hypertensive heart disease with heart failure: Secondary | ICD-10-CM | POA: Diagnosis not present

## 2021-04-19 DIAGNOSIS — Z7901 Long term (current) use of anticoagulants: Secondary | ICD-10-CM | POA: Insufficient documentation

## 2021-04-19 DIAGNOSIS — Z79899 Other long term (current) drug therapy: Secondary | ICD-10-CM | POA: Diagnosis not present

## 2021-04-19 DIAGNOSIS — Z471 Aftercare following joint replacement surgery: Secondary | ICD-10-CM | POA: Diagnosis not present

## 2021-04-19 DIAGNOSIS — I255 Ischemic cardiomyopathy: Secondary | ICD-10-CM | POA: Diagnosis not present

## 2021-04-19 HISTORY — PX: TOTAL HIP ARTHROPLASTY: SHX124

## 2021-04-19 LAB — PROTIME-INR
INR: 1.1 (ref 0.8–1.2)
Prothrombin Time: 14.4 seconds (ref 11.4–15.2)

## 2021-04-19 LAB — ABO/RH: ABO/RH(D): A POS

## 2021-04-19 LAB — TYPE AND SCREEN
ABO/RH(D): A POS
Antibody Screen: NEGATIVE

## 2021-04-19 LAB — APTT: aPTT: 29 seconds (ref 24–36)

## 2021-04-19 SURGERY — ARTHROPLASTY, HIP, TOTAL,POSTERIOR APPROACH
Anesthesia: General | Site: Hip | Laterality: Left

## 2021-04-19 MED ORDER — CEFAZOLIN SODIUM-DEXTROSE 2-4 GM/100ML-% IV SOLN
2.0000 g | INTRAVENOUS | Status: AC
  Administered 2021-04-19: 2 g via INTRAVENOUS
  Filled 2021-04-19: qty 100

## 2021-04-19 MED ORDER — PHENYLEPHRINE 40 MCG/ML (10ML) SYRINGE FOR IV PUSH (FOR BLOOD PRESSURE SUPPORT)
PREFILLED_SYRINGE | INTRAVENOUS | Status: AC
  Filled 2021-04-19: qty 10

## 2021-04-19 MED ORDER — SUGAMMADEX SODIUM 200 MG/2ML IV SOLN
INTRAVENOUS | Status: DC | PRN
  Administered 2021-04-19: 200 mg via INTRAVENOUS

## 2021-04-19 MED ORDER — DEXAMETHASONE SODIUM PHOSPHATE 10 MG/ML IJ SOLN
INTRAMUSCULAR | Status: DC | PRN
  Administered 2021-04-19: 10 mg via INTRAVENOUS

## 2021-04-19 MED ORDER — LIDOCAINE 2% (20 MG/ML) 5 ML SYRINGE
INTRAMUSCULAR | Status: DC | PRN
  Administered 2021-04-19: 60 mg via INTRAVENOUS

## 2021-04-19 MED ORDER — DEXAMETHASONE SODIUM PHOSPHATE 10 MG/ML IJ SOLN
INTRAMUSCULAR | Status: AC
  Filled 2021-04-19: qty 1

## 2021-04-19 MED ORDER — STERILE WATER FOR IRRIGATION IR SOLN
Status: DC | PRN
  Administered 2021-04-19: 2000 mL

## 2021-04-19 MED ORDER — MORPHINE SULFATE (PF) 2 MG/ML IV SOLN
0.5000 mg | INTRAVENOUS | Status: DC | PRN
  Filled 2021-04-19: qty 1

## 2021-04-19 MED ORDER — ALBUMIN HUMAN 5 % IV SOLN
INTRAVENOUS | Status: AC
  Filled 2021-04-19: qty 250

## 2021-04-19 MED ORDER — ROCURONIUM BROMIDE 10 MG/ML (PF) SYRINGE
PREFILLED_SYRINGE | INTRAVENOUS | Status: DC | PRN
  Administered 2021-04-19: 20 mg via INTRAVENOUS
  Administered 2021-04-19: 50 mg via INTRAVENOUS

## 2021-04-19 MED ORDER — FENTANYL CITRATE (PF) 250 MCG/5ML IJ SOLN: INTRAMUSCULAR | Status: DC | PRN

## 2021-04-19 MED ORDER — ACETAMINOPHEN 500 MG PO TABS
500.0000 mg | ORAL_TABLET | Freq: Four times a day (QID) | ORAL | Status: DC
  Filled 2021-04-19: qty 1

## 2021-04-19 MED ORDER — BISACODYL 10 MG RE SUPP: 10.0000 mg | Freq: Every day | RECTAL | Status: DC | PRN

## 2021-04-19 MED ORDER — BUPIVACAINE HCL (PF) 0.25 % IJ SOLN
INTRAMUSCULAR | Status: AC
  Filled 2021-04-19: qty 30

## 2021-04-19 MED ORDER — FENTANYL CITRATE (PF) 100 MCG/2ML IJ SOLN
INTRAMUSCULAR | Status: AC
  Filled 2021-04-19: qty 2

## 2021-04-19 MED ORDER — ONDANSETRON HCL 4 MG/2ML IJ SOLN
INTRAMUSCULAR | Status: DC | PRN
  Administered 2021-04-19: 4 mg via INTRAVENOUS

## 2021-04-19 MED ORDER — BUPIVACAINE HCL (PF) 0.25 % IJ SOLN
INTRAMUSCULAR | Status: DC | PRN
  Administered 2021-04-19: 30 mL

## 2021-04-19 MED ORDER — KETOROLAC TROMETHAMINE 30 MG/ML IJ SOLN
INTRAMUSCULAR | Status: AC
  Filled 2021-04-19: qty 1

## 2021-04-19 MED ORDER — PHENYLEPHRINE 40 MCG/ML (10ML) SYRINGE FOR IV PUSH (FOR BLOOD PRESSURE SUPPORT)
PREFILLED_SYRINGE | INTRAVENOUS | Status: DC | PRN
  Administered 2021-04-19: 80 ug via INTRAVENOUS
  Administered 2021-04-19: 40 ug via INTRAVENOUS
  Administered 2021-04-19 (×2): 120 ug via INTRAVENOUS
  Administered 2021-04-19: 80 ug via INTRAVENOUS

## 2021-04-19 MED ORDER — ZONISAMIDE 25 MG PO CAPS
50.0000 mg | ORAL_CAPSULE | Freq: Every day | ORAL | Status: DC
  Administered 2021-04-20: 50 mg via ORAL
  Filled 2021-04-19: qty 2

## 2021-04-19 MED ORDER — ONDANSETRON HCL 4 MG/2ML IJ SOLN: 4.0000 mg | Freq: Four times a day (QID) | INTRAMUSCULAR | Status: DC | PRN

## 2021-04-19 MED ORDER — ACETAMINOPHEN 10 MG/ML IV SOLN
INTRAVENOUS | Status: AC
  Filled 2021-04-19: qty 100

## 2021-04-19 MED ORDER — METHOCARBAMOL 500 MG IVPB - SIMPLE MED
500.0000 mg | Freq: Four times a day (QID) | INTRAVENOUS | Status: DC | PRN
  Administered 2021-04-19: 500 mg via INTRAVENOUS
  Filled 2021-04-19: qty 50

## 2021-04-19 MED ORDER — ALBUMIN HUMAN 5 % IV SOLN: INTRAVENOUS | Status: DC | PRN

## 2021-04-19 MED ORDER — CARVEDILOL 12.5 MG PO TABS: 12.5000 mg | ORAL_TABLET | Freq: Two times a day (BID) | ORAL | Status: DC

## 2021-04-19 MED ORDER — DIPHENHYDRAMINE HCL 12.5 MG/5ML PO ELIX
12.5000 mg | ORAL_SOLUTION | ORAL | Status: DC | PRN
  Administered 2021-04-20: 25 mg via ORAL
  Filled 2021-04-19: qty 10

## 2021-04-19 MED ORDER — FENTANYL CITRATE PF 50 MCG/ML IJ SOSY
PREFILLED_SYRINGE | INTRAMUSCULAR | Status: AC
  Filled 2021-04-19: qty 1

## 2021-04-19 MED ORDER — ALUM & MAG HYDROXIDE-SIMETH 200-200-20 MG/5ML PO SUSP: 30.0000 mL | ORAL | Status: DC | PRN

## 2021-04-19 MED ORDER — POVIDONE-IODINE 10 % EX SWAB
2.0000 "application " | Freq: Once | CUTANEOUS | Status: AC
  Administered 2021-04-19: 2 via TOPICAL

## 2021-04-19 MED ORDER — GABAPENTIN 300 MG PO CAPS
300.0000 mg | ORAL_CAPSULE | Freq: Three times a day (TID) | ORAL | Status: DC
  Administered 2021-04-19 – 2021-04-20 (×2): 300 mg via ORAL
  Filled 2021-04-19 (×2): qty 1

## 2021-04-19 MED ORDER — ROCURONIUM BROMIDE 10 MG/ML (PF) SYRINGE
PREFILLED_SYRINGE | INTRAVENOUS | Status: AC
  Filled 2021-04-19: qty 10

## 2021-04-19 MED ORDER — ZONISAMIDE 100 MG PO CAPS
100.0000 mg | ORAL_CAPSULE | Freq: Every day | ORAL | Status: DC
  Administered 2021-04-19: 100 mg via ORAL
  Filled 2021-04-19: qty 1

## 2021-04-19 MED ORDER — VENLAFAXINE HCL 75 MG PO TABS
75.0000 mg | ORAL_TABLET | Freq: Every day | ORAL | Status: DC
  Administered 2021-04-20: 75 mg via ORAL
  Filled 2021-04-19: qty 1

## 2021-04-19 MED ORDER — DOCUSATE SODIUM 100 MG PO CAPS
100.0000 mg | ORAL_CAPSULE | Freq: Two times a day (BID) | ORAL | Status: DC
  Administered 2021-04-19 – 2021-04-20 (×2): 100 mg via ORAL
  Filled 2021-04-19 (×2): qty 1

## 2021-04-19 MED ORDER — TRAZODONE HCL 50 MG PO TABS
50.0000 mg | ORAL_TABLET | Freq: Every evening | ORAL | Status: DC | PRN
  Administered 2021-04-19: 50 mg via ORAL
  Filled 2021-04-19: qty 1

## 2021-04-19 MED ORDER — CEFAZOLIN SODIUM-DEXTROSE 2-4 GM/100ML-% IV SOLN
2.0000 g | Freq: Four times a day (QID) | INTRAVENOUS | Status: AC
  Administered 2021-04-19 – 2021-04-20 (×2): 2 g via INTRAVENOUS
  Filled 2021-04-19 (×2): qty 100

## 2021-04-19 MED ORDER — AMISULPRIDE (ANTIEMETIC) 5 MG/2ML IV SOLN: 10.0000 mg | Freq: Once | INTRAVENOUS | Status: DC | PRN

## 2021-04-19 MED ORDER — LIDOCAINE HCL (PF) 2 % IJ SOLN
INTRAMUSCULAR | Status: AC
  Filled 2021-04-19: qty 5

## 2021-04-19 MED ORDER — FENTANYL CITRATE (PF) 100 MCG/2ML IJ SOLN
INTRAMUSCULAR | Status: DC | PRN
  Administered 2021-04-19: 50 ug via INTRAVENOUS
  Administered 2021-04-19 (×2): 25 ug via INTRAVENOUS
  Administered 2021-04-19: 100 ug via INTRAVENOUS

## 2021-04-19 MED ORDER — ACETAMINOPHEN 10 MG/ML IV SOLN
1000.0000 mg | Freq: Once | INTRAVENOUS | Status: DC | PRN
  Administered 2021-04-19: 1000 mg via INTRAVENOUS

## 2021-04-19 MED ORDER — CHLORHEXIDINE GLUCONATE 0.12 % MT SOLN: 15.0000 mL | Freq: Once | OROMUCOSAL | Status: AC

## 2021-04-19 MED ORDER — ACETAMINOPHEN 325 MG PO TABS: 325.0000 mg | ORAL_TABLET | Freq: Four times a day (QID) | ORAL | Status: DC | PRN

## 2021-04-19 MED ORDER — RAMIPRIL 2.5 MG PO CAPS
2.5000 mg | ORAL_CAPSULE | Freq: Every day | ORAL | Status: DC
  Administered 2021-04-20: 2.5 mg via ORAL
  Filled 2021-04-19: qty 1

## 2021-04-19 MED ORDER — DEXAMETHASONE SODIUM PHOSPHATE 10 MG/ML IJ SOLN
10.0000 mg | Freq: Once | INTRAMUSCULAR | Status: AC
  Administered 2021-04-20: 10 mg via INTRAVENOUS
  Filled 2021-04-19: qty 1

## 2021-04-19 MED ORDER — METOCLOPRAMIDE HCL 5 MG/ML IJ SOLN: 5.0000 mg | Freq: Three times a day (TID) | INTRAMUSCULAR | Status: DC | PRN

## 2021-04-19 MED ORDER — ACETAMINOPHEN 500 MG PO TABS
ORAL_TABLET | ORAL | Status: AC
  Filled 2021-04-19: qty 1

## 2021-04-19 MED ORDER — POTASSIUM CHLORIDE IN NACL 20-0.45 MEQ/L-% IV SOLN
INTRAVENOUS | Status: DC
  Filled 2021-04-19 (×2): qty 1000

## 2021-04-19 MED ORDER — POVIDONE-IODINE 10 % EX SWAB: 2.0000 "application " | Freq: Once | CUTANEOUS | Status: DC

## 2021-04-19 MED ORDER — ORAL CARE MOUTH RINSE
15.0000 mL | Freq: Once | OROMUCOSAL | Status: AC
  Administered 2021-04-19: 15 mL via OROMUCOSAL

## 2021-04-19 MED ORDER — HYDROCODONE-ACETAMINOPHEN 5-325 MG PO TABS
1.0000 | ORAL_TABLET | ORAL | Status: DC | PRN
  Administered 2021-04-20 (×2): 2 via ORAL
  Filled 2021-04-19 (×2): qty 2

## 2021-04-19 MED ORDER — APIXABAN 5 MG PO TABS
5.0000 mg | ORAL_TABLET | Freq: Two times a day (BID) | ORAL | Status: DC
  Administered 2021-04-20: 5 mg via ORAL
  Filled 2021-04-19: qty 1

## 2021-04-19 MED ORDER — LACTATED RINGERS IV SOLN: INTRAVENOUS | Status: DC

## 2021-04-19 MED ORDER — ONDANSETRON HCL 4 MG/2ML IJ SOLN
INTRAMUSCULAR | Status: AC
  Filled 2021-04-19: qty 2

## 2021-04-19 MED ORDER — METHOCARBAMOL 500 MG IVPB - SIMPLE MED
INTRAVENOUS | Status: AC
  Filled 2021-04-19: qty 50

## 2021-04-19 MED ORDER — FENTANYL CITRATE PF 50 MCG/ML IJ SOSY
25.0000 ug | PREFILLED_SYRINGE | INTRAMUSCULAR | Status: DC | PRN
  Administered 2021-04-19 (×4): 25 ug via INTRAVENOUS

## 2021-04-19 MED ORDER — METOCLOPRAMIDE HCL 5 MG PO TABS: 5.0000 mg | ORAL_TABLET | Freq: Three times a day (TID) | ORAL | Status: DC | PRN

## 2021-04-19 MED ORDER — PHENYLEPHRINE HCL (PRESSORS) 10 MG/ML IV SOLN
INTRAVENOUS | Status: AC
  Filled 2021-04-19: qty 2

## 2021-04-19 MED ORDER — 0.9 % SODIUM CHLORIDE (POUR BTL) OPTIME
TOPICAL | Status: DC | PRN
  Administered 2021-04-19: 1000 mL

## 2021-04-19 MED ORDER — METHOCARBAMOL 500 MG PO TABS: 500.0000 mg | ORAL_TABLET | Freq: Four times a day (QID) | ORAL | Status: DC | PRN

## 2021-04-19 MED ORDER — ONDANSETRON HCL 4 MG PO TABS: 4.0000 mg | ORAL_TABLET | Freq: Four times a day (QID) | ORAL | Status: DC | PRN

## 2021-04-19 MED ORDER — SIMVASTATIN 40 MG PO TABS
40.0000 mg | ORAL_TABLET | Freq: Every day | ORAL | Status: DC
  Administered 2021-04-19: 40 mg via ORAL
  Filled 2021-04-19: qty 1

## 2021-04-19 MED ORDER — PROPOFOL 10 MG/ML IV BOLUS
INTRAVENOUS | Status: DC | PRN
  Administered 2021-04-19: 100 mg via INTRAVENOUS

## 2021-04-19 MED ORDER — KETOROLAC TROMETHAMINE 30 MG/ML IJ SOLN
INTRAMUSCULAR | Status: DC | PRN
  Administered 2021-04-19: 30 mg via INTRA_ARTICULAR

## 2021-04-19 MED ORDER — POLYETHYLENE GLYCOL 3350 17 G PO PACK: 17.0000 g | PACK | Freq: Every day | ORAL | Status: DC | PRN

## 2021-04-19 MED ORDER — PHENOL 1.4 % MT LIQD: 1.0000 | OROMUCOSAL | Status: DC | PRN

## 2021-04-19 MED ORDER — FUROSEMIDE 40 MG PO TABS: 40.0000 mg | ORAL_TABLET | Freq: Every day | ORAL | Status: DC | PRN

## 2021-04-19 MED ORDER — ZOLPIDEM TARTRATE 5 MG PO TABS: 5.0000 mg | ORAL_TABLET | Freq: Every evening | ORAL | Status: DC | PRN

## 2021-04-19 MED ORDER — MENTHOL 3 MG MT LOZG: 1.0000 | LOZENGE | OROMUCOSAL | Status: DC | PRN

## 2021-04-19 MED ORDER — HYDROCODONE-ACETAMINOPHEN 7.5-325 MG PO TABS
1.0000 | ORAL_TABLET | ORAL | Status: DC | PRN
  Administered 2021-04-19: 2 via ORAL
  Filled 2021-04-19: qty 2

## 2021-04-19 MED ORDER — FLEET ENEMA 7-19 GM/118ML RE ENEM: 1.0000 | ENEMA | Freq: Once | RECTAL | Status: DC | PRN

## 2021-04-19 MED ORDER — POVIDONE-IODINE 7.5 % EX SOLN: Freq: Once | CUTANEOUS | Status: DC

## 2021-04-19 MED ORDER — ONDANSETRON HCL 4 MG/2ML IJ SOLN: 4.0000 mg | Freq: Once | INTRAMUSCULAR | Status: DC | PRN

## 2021-04-19 MED ORDER — CARVEDILOL 12.5 MG PO TABS
12.5000 mg | ORAL_TABLET | Freq: Two times a day (BID) | ORAL | Status: DC
  Administered 2021-04-19 – 2021-04-20 (×2): 12.5 mg via ORAL
  Filled 2021-04-19 (×2): qty 1

## 2021-04-19 MED ORDER — ACETAMINOPHEN 500 MG PO TABS
1000.0000 mg | ORAL_TABLET | Freq: Once | ORAL | Status: DC
  Filled 2021-04-19: qty 2

## 2021-04-19 MED ORDER — PHENYLEPHRINE HCL-NACL 20-0.9 MG/250ML-% IV SOLN
INTRAVENOUS | Status: DC | PRN
  Administered 2021-04-19: 50 ug/min via INTRAVENOUS

## 2021-04-19 SURGICAL SUPPLY — 61 items
ACETAB CUP W/GRIPTION 54 (Plate) ×2 IMPLANT
ARTICULEZE HEAD (Hips) ×2 IMPLANT
BAG COUNTER SPONGE SURGICOUNT (BAG) IMPLANT
BAG SPNG CNTER NS LX DISP (BAG)
BIT DRILL 2.0X128 (BIT) ×2 IMPLANT
BLADE SAW SAG 73X25 THK (BLADE) ×1
BLADE SAW SGTL 73X25 THK (BLADE) ×1 IMPLANT
CLSR STERI-STRIP ANTIMIC 1/2X4 (GAUZE/BANDAGES/DRESSINGS) ×4 IMPLANT
COVER SURGICAL LIGHT HANDLE (MISCELLANEOUS) ×2 IMPLANT
CUP ACETAB W/GRIPTION 54 (Plate) IMPLANT
DRAPE INCISE IOBAN 66X45 STRL (DRAPES) ×2 IMPLANT
DRAPE ORTHO SPLIT 77X108 STRL (DRAPES) ×4
DRAPE POUCH INSTRU U-SHP 10X18 (DRAPES) ×2 IMPLANT
DRAPE SHEET LG 3/4 BI-LAMINATE (DRAPES) ×2 IMPLANT
DRAPE SURG 17X11 SM STRL (DRAPES) ×2 IMPLANT
DRAPE SURG ORHT 6 SPLT 77X108 (DRAPES) ×2 IMPLANT
DRAPE U-SHAPE 47X51 STRL (DRAPES) ×2 IMPLANT
DRSG MEPILEX BORDER 4X12 (GAUZE/BANDAGES/DRESSINGS) ×1 IMPLANT
DRSG MEPILEX BORDER 4X8 (GAUZE/BANDAGES/DRESSINGS) ×2 IMPLANT
DURAPREP 26ML APPLICATOR (WOUND CARE) ×4 IMPLANT
ELECT BLADE TIP CTD 4 INCH (ELECTRODE) ×2 IMPLANT
ELECT REM PT RETURN 15FT ADLT (MISCELLANEOUS) ×2 IMPLANT
ELIMINATOR HOLE APEX DEPUY (Hips) ×1 IMPLANT
FACESHIELD WRAPAROUND (MASK) ×2 IMPLANT
FACESHIELD WRAPAROUND OR TEAM (MASK) ×1 IMPLANT
GLOVE SRG 8 PF TXTR STRL LF DI (GLOVE) ×1 IMPLANT
GLOVE SURG ENC MOIS LTX SZ7.5 (GLOVE) ×2 IMPLANT
GLOVE SURG POLYISO LF SZ6.5 (GLOVE) ×2 IMPLANT
GLOVE SURG UNDER POLY LF SZ7 (GLOVE) ×2 IMPLANT
GLOVE SURG UNDER POLY LF SZ8 (GLOVE) ×2
GOWN STRL REUS W/TWL LRG LVL3 (GOWN DISPOSABLE) ×4 IMPLANT
HEAD ARTICULEZE (Hips) IMPLANT
HOOD PEEL AWAY FLYTE STAYCOOL (MISCELLANEOUS) ×6 IMPLANT
KIT BASIN OR (CUSTOM PROCEDURE TRAY) ×2 IMPLANT
KIT TURNOVER KIT A (KITS) ×2 IMPLANT
LINER NEUTRAL 54X36MM PLUS 4 (Hips) ×1 IMPLANT
MANIFOLD NEPTUNE II (INSTRUMENTS) ×2 IMPLANT
NDL MA TROC 1/2 (NEEDLE) IMPLANT
NDL SAFETY ECLIPSE 18X1.5 (NEEDLE) ×2 IMPLANT
NEEDLE HYPO 18GX1.5 SHARP (NEEDLE) ×4
NEEDLE MA TROC 1/2 (NEEDLE) IMPLANT
NS IRRIG 1000ML POUR BTL (IV SOLUTION) ×2 IMPLANT
PACK TOTAL JOINT (CUSTOM PROCEDURE TRAY) ×2 IMPLANT
PROTECTOR NERVE ULNAR (MISCELLANEOUS) ×2 IMPLANT
RETRIEVER SUT HEWSON (MISCELLANEOUS) ×2 IMPLANT
SCREW 6.5MMX25MM (Screw) ×1 IMPLANT
SUCTION FRAZIER HANDLE 12FR (TUBING) ×2
SUCTION TUBE FRAZIER 12FR DISP (TUBING) ×1 IMPLANT
SUT FIBERWIRE #2 38 REV NDL BL (SUTURE) ×6
SUT VIC AB 0 CT1 36 (SUTURE) ×2 IMPLANT
SUT VIC AB 1 CT1 36 (SUTURE) ×4 IMPLANT
SUT VIC AB 2-0 CT1 27 (SUTURE) ×4
SUT VIC AB 2-0 CT1 TAPERPNT 27 (SUTURE) ×2 IMPLANT
SUT VIC AB 3-0 SH 8-18 (SUTURE) ×2 IMPLANT
SUTURE FIBERWR#2 38 REV NDL BL (SUTURE) ×3 IMPLANT
SYR CONTROL 10ML LL (SYRINGE) ×4 IMPLANT
TAP DUOFIX SZ5 STD OFF (Hips) ×1 IMPLANT
TAPE STRIPS DRAPE STRL (GAUZE/BANDAGES/DRESSINGS) ×1 IMPLANT
TOWEL OR 17X26 10 PK STRL BLUE (TOWEL DISPOSABLE) ×2 IMPLANT
TRAY FOLEY MTR SLVR 16FR STAT (SET/KITS/TRAYS/PACK) ×2 IMPLANT
WATER STERILE IRR 1000ML POUR (IV SOLUTION) ×4 IMPLANT

## 2021-04-19 NOTE — Anesthesia Procedure Notes (Signed)
Procedure Name: Intubation Date/Time: 04/19/2021 1:21 PM Performed by: Myna Bright, CRNA Pre-anesthesia Checklist: Patient identified, Emergency Drugs available, Suction available and Patient being monitored Patient Re-evaluated:Patient Re-evaluated prior to induction Oxygen Delivery Method: Circle system utilized Preoxygenation: Pre-oxygenation with 100% oxygen Induction Type: IV induction Ventilation: Mask ventilation without difficulty Laryngoscope Size: Mac and 3 Grade View: Grade I Tube type: Oral Tube size: 7.0 mm Number of attempts: 1 Airway Equipment and Method: Stylet Placement Confirmation: ETT inserted through vocal cords under direct vision, positive ETCO2 and breath sounds checked- equal and bilateral Secured at: 21 cm Tube secured with: Tape Dental Injury: Teeth and Oropharynx as per pre-operative assessment

## 2021-04-19 NOTE — Discharge Instructions (Signed)

## 2021-04-19 NOTE — Anesthesia Postprocedure Evaluation (Signed)
Anesthesia Post Note  Patient: Rachel Bates  Procedure(s) Performed: TOTAL HIP ARTHROPLASTY (Left: Hip)     Patient location during evaluation: PACU Anesthesia Type: General Level of consciousness: sedated, patient cooperative and oriented Pain management: pain level controlled Vital Signs Assessment: post-procedure vital signs reviewed and stable Respiratory status: spontaneous breathing, nonlabored ventilation and respiratory function stable Cardiovascular status: blood pressure returned to baseline and stable Postop Assessment: no apparent nausea or vomiting Anesthetic complications: no   No notable events documented.  Last Vitals:  Vitals:   04/19/21 1535 04/19/21 1545  BP: (!) 125/96 (!) 100/50  Pulse: 74 69  Resp: 10 14  Temp: 36.5 C   SpO2: 98% 100%    Last Pain:  Vitals:   04/19/21 1152  TempSrc: Oral                 Chasitty Hehl,E. Jniya Madara

## 2021-04-19 NOTE — H&P (Signed)
PREOPERATIVE H&P  Chief Complaint: Left hip primary localized osteoarthritis  HPI: Rachel Bates is a 75 y.o. female who presents for preoperative history and physical with a diagnosis of left hip osteoarthritis. Symptoms are rated as moderate to severe, and have been worsening.  This is significantly impairing activities of daily living.  She has elected for surgical management.  She has had the other hip replaced and has had excellent recovery.  She has failed injections, activity modification, anti-inflammatories, and assistive devices.  Preoperative X-rays demonstrate end stage degenerative changes with osteophyte formation, loss of joint space, subchondral sclerosis.   Past Medical History:  Diagnosis Date   AICD (automatic cardioverter/defibrillator) present dual   Medtr[employer] ---  original placedment 2007/  generator change 2014 by dr gregg taylor   Anemia    Anticoagulant long-term use    eliquis   Anxiety    Arthralgia of multiple joints    Arthritis pain    Benign hypertensive heart disease    CAD (coronary artery disease) primary cardiologist-- dr gregg taylor   MI and 2 stents 1995 in [place]   CHF NYHA class II, chronic, systolic ([place])    followed by dr Carleene Overlie taylor   Degenerative disc disease, lumbar    Depression    Dyslipidemia    History of basal cell carcinoma (BCC) excision    right ankle area s/p  excision in office 06/ 2019  in office   History of DVT of lower extremity yrs ago before 2012   History of MI (myocardial infarction) 1995  in [place]   History of pulmonary embolus (PE) 2012   History of ventricular tachycardia    Hyperlipidemia    Hypersomnia    Ischemic cardiomyopathy    Migraines    OA (osteoarthritis)    "all over"   Obstructive apnea    PAF (paroxysmal atrial fibrillation) (HCC)    Primary localized osteoarthritis of right hip 12/18/2017   Primary localized osteoarthritis of right knee 05/28/2018   S/P coronary artery stent placement  1995   in [facility]   05-21-2018 per pt x2  stents in same coronary artery (unsure BM or DES)   Vitamin D deficiency disease    Past Surgical History:  Procedure Laterality Date   CARDIAC DEFIBRILLATOR PLACEMENT  2007   CATARACT EXTRACTION, BILATERAL     COLONOSCOPY  04/14/2013   colonic polyp, status post polypectomy. Mild panocolonic diverticulosis. Small internal hemorrhoids   DILATION AND CURETTAGE OF UTERUS  yrs ago   IMPLANTABLE CARDIOVERTER DEFIBRILLATOR GENERATOR CHANGE N/A 02/04/2013   Procedure: IMPLANTABLE CARDIOVERTER DEFIBRILLATOR GENERATOR CHANGE;  Surgeon: Evans Lance, MD;  Location[facility] C[facility] LAB;  Service: Cardiovascular;  Laterality: N/A;   SHOULDER ARTHROSCOPY Right 2015   TOTAL HIP ARTHROPLASTY Right 12/18/2017   Procedure: RIGHT TOTAL HIP ARTHROPLASTY;  Surgeon: Marchia Bond, MD;  Location: [facility];  Service: Orthopedics;  Laterality: Right;   TOTAL KNEE ARTHROPLASTY Right 05/28/2018   Procedure: TOTAL KNEE ARTHROPLASTY;  Surgeon: Marchia Bond, MD;  Location: [facility];  Service: Orthopedics;  Laterality: Right;  Adductor Block   TUBAL LIGATION Bilateral yrs ago   WISDOM TOOTH EXTRACTION     Social History   Socioeconomic History   Marital status: Single    Spouse name: Not on file   Number of children: Not on file   Years of education: Not on file   Highest education level: Not on file  Occupational History   Not on file  Tobacco Use  Smoking status: Former    Years: 30.00    Types: Cigarettes    Quit date: 05/21/1996    Years since quitting: 24.9   Smokeless tobacco: Never  Vaping Use   Vaping Use: Never used  Substance and Sexual Activity   Alcohol use: No   Drug use: Never   Sexual activity: Not on file  Other Topics Concern   Not on file  Social History Narrative   Not on file   Social Determinants of Health   Financial Resource Strain: Not on file  Food Insecurity: Not on file  Transportation Needs: Not on file  Physical Activity: Not on file   Stress: Not on file  Social Connections: Not on file   Family History  Problem Relation Age of Onset   Hypertension Mother    Thyroid disease Mother    Alzheimer's disease Mother    Coronary artery disease Father    Pulmonary embolism Father    Congestive Heart Failure Maternal Grandmother    Hypertension Maternal Grandmother    Heart attack Maternal Grandfather    Other Maternal Grandfather        carotid disease   Dementia Paternal Grandmother    Other Paternal Grandfather 45       accident   Allergies  Allergen Reactions   Sumatriptan Succinate Other (See Comments)    Chest pain, no triptans, pt states "it makes my heart race"   Amitriptyline Other (See Comments)    Weight gain   Prior to Admission medications   Medication Sig Start Date End Date Taking? Authorizing Provider  acetaminophen (TYLENOL) 650 MG CR tablet Take 1,300 mg by mouth every 8 (eight) hours as needed for pain.   Yes [provider]  apixaban (ELIQUIS) 5 MG TABS tablet Take 1 tablet (5 mg total) by mouth 2 (two) times daily. Obtaining lab work from PCP office.  May continue to fill. 07/29/20  Yes Evans Lance, MD  b complex vitamins capsule Take 1 capsule by mouth daily.   Yes [provider]  Calcium Carb-Cholecalciferol (CALCIUM 600 + D PO) Take 1 tablet by mouth daily.   Yes [provider]  carvedilol (COREG) 12.5 MG tablet Take 12.5 mg by mouth 2 (two) times daily with a meal.    Yes [provider]  Cholecalciferol (DIALYVITE VITAMIN D 5000) 125 MCG (5000 UT) capsule Take 5,000 Units by mouth daily.   Yes [provider]  Cyanocobalamin (B-12 PO) Take 1 tablet by mouth daily.   Yes [provider]  diphenhydrAMINE (BENADRYL) 25 mg capsule Take 25 mg by mouth daily.   Yes [provider]  FIBER PO Take 1 capsule by mouth at bedtime.   Yes [provider]  Fiber POWD Take 1 Scoop by mouth daily.   Yes [provider]   furosemide (LASIX) 40 MG tablet Take 40 mg by mouth daily as needed for edema.   Yes [provider]  gabapentin (NEURONTIN) 300 MG capsule Take 300 mg by mouth 3 (three) times daily. 04/09/20  Yes [provider]  Neomycin-Bacitracin-Polymyxin (TRIPLE ANTIBIOTIC) OINT Apply 1 application topically daily as needed (wound care).   Yes [provider]  Omega-3 Fatty Acids (FISH OIL) 1000 MG CAPS Take 1,000 mg by mouth daily.    Yes [provider]  ramipril (ALTACE) 2.5 MG tablet Take 2.5 mg by mouth daily.     Yes [provider]  simvastatin (ZOCOR) 40 MG tablet Take 40  mg by mouth at bedtime.     Yes [provider]  traZODone (DESYREL) 50 MG tablet Take 50 mg by mouth at bedtime.    Yes [provider]  venlafaxine (EFFEXOR) 75 MG tablet Take 75 mg by mouth daily. 03/06/21  Yes [provider]  vitamin E 400 UNIT capsule Take 400 Units by mouth daily.   Yes [provider]  zonisamide (ZONEGRAN) 50 MG capsule Take 50-100 mg by mouth See admin instructions. Take 50mg  by mouth in the morning and 100mg  at night. 09/13/17  Yes [provider]     Positive ROS: All other systems have been reviewed and were otherwise negative with the exception of those mentioned in the HPI and as above.  Physical Exam: General: Alert, no acute distress Cardiovascular: No pedal edema Respiratory: No cyanosis, no use of accessory musculature GI: No organomegaly, abdomen is soft and non-tender Skin: No lesions in the area of chief complaint Neurologic: Sensation intact distally Psychiatric: Patient is competent for consent with normal mood and affect Lymphatic: No axillary or cervical lymphadenopathy  MUSCULOSKELETAL: Left hip has a painful arc of motion, EHL intact.  Assessment: Left hip osteoarthritis with risk factors as organized above.   Plan: Plan for Procedure(s): TOTAL HIP ARTHROPLASTY  The risks benefits and  alternatives were discussed with the patient including but not limited to the risks of nonoperative treatment, versus surgical intervention including infection, bleeding, nerve injury,  blood clots, cardiopulmonary complications, morbidity, mortality, among others, and they were willing to proceed.    Patient's anticipated LOS is less than 2 midnights, meeting these requirements: - Younger than 27 - Lives within 1 hour of care - Has a competent adult at home to recover with post-op recover - NO history of  - Chronic pain requiring opiods  - Diabetes  - Coronary Artery Disease  - Heart failure  - Heart attack  - Stroke  - DVT/VTE  - Cardiac arrhythmia  - Respiratory Failure/COPD  - Renal failure  - Anemia  - Advanced Liver disease      Johnny Bridge, MD Cell [phone]   04/19/2021 11:59 AM

## 2021-04-19 NOTE — Op Note (Signed)
04/19/2021  2:51 PM  PATIENT:  Rachel Bates   MRN: [id number]  PRE-OPERATIVE DIAGNOSIS:  left hip primary localized osteoarthritis  POST-OPERATIVE DIAGNOSIS:  same  PROCEDURE:  Procedure(s): TOTAL HIP ARTHROPLASTY  PREOPERATIVE INDICATIONS:    Rachel Bates is an 75 y.o. female who has a diagnosis of left hip primary localized osteoarthritis and elected for surgical management after failing conservative treatment.  The risks benefits and alternatives were discussed with the patient including but not limited to the risks of nonoperative treatment, versus surgical intervention including infection, bleeding, nerve injury, periprosthetic fracture, the need for revision surgery, dislocation, leg length discrepancy, blood clots, cardiopulmonary complications, morbidity, mortality, among others, and they were willing to proceed.     OPERATIVE REPORT     SURGEON:  Marchia Bond, MD    ASSISTANT:  Merlene Pulling, PA-C, (Present throughout the entire procedure,  necessary for completion of procedure in a timely manner, assisting with retraction, instrumentation, and closure)     ANESTHESIA: General  ESTIMATED BLOOD LOSS: 009 mL    COMPLICATIONS:  None.     UNIQUE ASPECTS OF THE CASE: She had advanced osteoarthritic changes, similar to the other side.  Her neck was a little bit valgus.  I went with a +5, matching the other side, and it felt like the stability was appropriate and the lengths were equal.  There was a lot of shock with the 1.5.  I can see a fair amount of beads posteriorly and superiorly, and just a small portion of anterior wall visible.  She bled somewhat consistently throughout the case, likely secondary to her chronic anticoagulation.  COMPONENTS:  [employer]uy [employer] fit femur size 5 with a 36 mm + 5 metallic head ball and a Gription Acetabular shell size 54, with a single cancellous screw for backup fixation, with an apex hole eliminator and a +4 neutral  polyethylene liner.    PROCEDURE IN DETAIL:   The patient was met in the holding area and  identified.  The appropriate hip was identified and marked at the operative site.  The patient was then transported to the OR  and  placed under anesthesia.  At that point, the patient was  placed in the lateral decubitus position with the operative side up and  secured to the operating room table and all bony prominences padded.     The operative lower extremity was prepped from the iliac crest to the distal leg.  Sterile draping was performed.  Time out was performed prior to incision.      A routine posterolateral approach was utilized via sharp dissection  carried down to the subcutaneous tissue.  Gross bleeders were Bovie coagulated.  The iliotibial band was identified and incised along the length of the skin incision.  Self-retaining retractors were  inserted.  With the hip internally rotated, the short external rotators  were identified. The piriformis and capsule was tagged with FiberWire, and the hip capsule released in a T-type fashion.  The femoral neck was exposed, and I resected the femoral neck using the appropriate jig. This was performed at approximately a thumb's breadth above the lesser trochanter.    I then exposed the deep acetabulum, cleared out any tissue including the ligamentum teres.  A wing retractor was placed.  After adequate visualization, I excised the labrum, and then sequentially reamed.  I placed the trial acetabulum, which seated nicely, and then impacted the real cup into place.  Appropriate version and inclination was confirmed  clinically matching their bony anatomy, and also with the use of the jig.  I placed a cancellous screw to augment fixation.  A trial polyethylene liner was placed and the wing retractor removed.    I then prepared the proximal femur using the cookie-cutter, the lateralizing reamer, and then sequentially reamed and broached.  A trial broach, neck, and  head was utilized, and I reduced the hip and it was found to have excellent stability with functional range of motion. The trial components were then removed, and the real polyethylene liner was placed.  I then impacted the real femoral prosthesis into place into the appropriate version, slightly anteverted to the normal anatomy, and I impacted the real head ball into place. The hip was then reduced and taken through functional range of motion and found to have excellent stability. Leg lengths were restored.  I then used a 2 mm drill bits to pass the FiberWire suture from the capsule and piriformis through the greater trochanter, and secured this. Excellent posterior capsular repair was achieved. I also closed the T in the capsule.  I then irrigated the hip copiously again with pulse lavage, and repaired the fascia with Vicryl, followed by Vicryl for the subcutaneous tissue, Monocryl for the skin, Steri-Strips and sterile gauze. The wounds were injected. The patient was then awakened and returned to PACU in stable and satisfactory condition. There were no complications.  Marchia Bond, MD Orthopedic Surgeon [phone]   04/19/2021 2:51 PM

## 2021-04-19 NOTE — Transfer of Care (Signed)
Immediate Anesthesia Transfer of Care Note  Patient: JULYSSA KYER  Procedure(s) Performed: TOTAL HIP ARTHROPLASTY (Left: Hip)  Patient Location: PACU  Anesthesia Type:General  Level of Consciousness: awake, drowsy and responds to stimulation  Airway & Oxygen Therapy: Patient Spontanous Breathing and Patient connected to face mask oxygen  Post-op Assessment: Report given to RN and Post -op Vital signs reviewed and stable  Post vital signs: Reviewed and stable  Last Vitals:  Vitals Value Taken Time  BP 125/96 04/19/21 1534  Temp    Pulse 74 04/19/21 1537  Resp 18 04/19/21 1537  SpO2 99 % 04/19/21 1537  Vitals shown include unvalidated device data.  Last Pain:  Vitals:   04/19/21 1152  TempSrc: Oral         Complications: No notable events documented.

## 2021-04-20 DIAGNOSIS — Z87891 Personal history of nicotine dependence: Secondary | ICD-10-CM | POA: Diagnosis not present

## 2021-04-20 DIAGNOSIS — M1612 Unilateral primary osteoarthritis, left hip: Secondary | ICD-10-CM | POA: Diagnosis not present

## 2021-04-20 DIAGNOSIS — Z86711 Personal history of pulmonary embolism: Secondary | ICD-10-CM | POA: Diagnosis not present

## 2021-04-20 DIAGNOSIS — I2510 Atherosclerotic heart disease of native coronary artery without angina pectoris: Secondary | ICD-10-CM | POA: Diagnosis not present

## 2021-04-20 DIAGNOSIS — Z955 Presence of coronary angioplasty implant and graft: Secondary | ICD-10-CM | POA: Diagnosis not present

## 2021-04-20 DIAGNOSIS — I252 Old myocardial infarction: Secondary | ICD-10-CM | POA: Diagnosis not present

## 2021-04-20 LAB — BASIC METABOLIC PANEL
Anion gap: 7 (ref 5–15)
BUN: 13 mg/dL (ref 8–23)
CO2: 25 mmol/L (ref 22–32)
Calcium: 8.8 mg/dL — ABNORMAL LOW (ref 8.9–10.3)
Chloride: 107 mmol/L (ref 98–111)
Creatinine, Ser: 0.73 mg/dL (ref 0.44–1.00)
GFR, Estimated: >60 mL/min (ref >60)
Glucose, Bld: 172 mg/dL — ABNORMAL HIGH (ref 70–99)
Potassium: 4.8 mmol/L (ref 3.5–5.1)
Sodium: 139 mmol/L (ref 135–145)

## 2021-04-20 LAB — CBC
HCT: 29.7 % — ABNORMAL LOW (ref 36.0–46.0)
Hemoglobin: 9.8 g/dL — ABNORMAL LOW (ref 12.0–15.0)
MCH: 30.2 pg (ref 26.0–34.0)
MCHC: 33 g/dL (ref 30.0–36.0)
MCV: 91.7 fL (ref 80.0–100.0)
Platelets: 78 10*3/uL — ABNORMAL LOW (ref 150–400)
RBC: 3.24 MIL/uL — ABNORMAL LOW (ref 3.87–5.11)
RDW: 14.7 % (ref 11.5–15.5)
WBC: 5.8 10*3/uL (ref 4.0–10.5)
nRBC: 0 % (ref 0.0–0.2)

## 2021-04-20 MED ORDER — SENNA-DOCUSATE SODIUM 8.6-50 MG PO TABS: 2.0000 | ORAL_TABLET | Freq: Every day | ORAL | 1 refills | Status: DC

## 2021-04-20 MED ORDER — HYDROCODONE-ACETAMINOPHEN 10-325 MG PO TABS: 1.0000 | ORAL_TABLET | Freq: Four times a day (QID) | ORAL | 0 refills | Status: DC | PRN

## 2021-04-20 NOTE — Progress Notes (Signed)
Patient educated on AVS including discharge instructions, follow up appointments, medications including where to pick up, when to take, and new meds, Patient PIV was removed and assisted to get dressed.  No further bleeding noted from dressing site; gauze and ABD pad was added for protection for today.  All belongings gathered and taken down with patient via wheelchair by writer to meet patients daughter at car.  No s/s of distress noted.

## 2021-04-20 NOTE — Progress Notes (Signed)
   04/20/21 1200  PT Visit Information  Last PT Received On 04/20/21  Reviewed posterior THA  HEP and precautions. Pt verbalizes 3/3 THP.  Pt does not have stairs at home, discussed this am, offerred to review, pt politely declines. Ready to d/c with family assist prn.  Assistance Needed +1  History of Present Illness 75 yo female s/p posterior L THA, PMH: anxiety, depression, DVT, PE, MI, OA, R THA  Subjective Data  Patient Stated Goal home today, asap  Precautions  Precautions Fall;Posterior Hip  Restrictions  Weight Bearing Restrictions No  Other Position/Activity Restrictions WBAT  Pain Assessment  Pain Assessment No/denies pain  Cognition  Arousal/Alertness Awake/alert  Behavior During Therapy WFL for tasks assessed/performed  Overall Cognitive Status Within Functional Limits for tasks assessed  Bed Mobility  General bed mobility comments NT--pt in chair  Total Joint Exercises  Ankle Circles/Pumps AROM;Both;10 reps  Quad Sets AROM;Both;15 reps  Short Arc Doretha Imus;Left;15 reps  Heel Slides AROM;Left;15 reps  Hip ABduction/ADduction AROM;10 reps;Left  PT - End of Session  Equipment Utilized During Treatment Gait belt  Activity Tolerance Patient tolerated treatment well  Patient left with call bell/phone within reach;in chair  Nurse Communication Mobility status   PT - Assessment/Plan  PT Plan Current plan remains appropriate  PT Visit Diagnosis Other abnormalities of gait and mobility (R26.89)  PT Frequency (ACUTE ONLY) 7X/week  Follow Up Recommendations Follow surgeon's recommendation for DC plan and follow-up therapies  PT equipment None recommended by PT  AM-PAC PT "6 Clicks" Mobility Outcome Measure (Version 2)  Help needed turning from your back to your side while in a flat bed without using bedrails? 3  Help needed moving from lying on your back to sitting on the side of a flat bed without using bedrails? 3  Help needed moving to and from a bed to a chair  (including a wheelchair)? 3  Help needed standing up from a chair using your arms (e.g., wheelchair or bedside chair)? 3  Help needed to walk in hospital room? 3  Help needed climbing 3-5 steps with a railing?  3  6 Click Score 18  Consider Recommendation of Discharge To: Home with [facility]  PT Goal Progression  Progress towards PT goals Progressing toward goals  Acute Rehab PT Goals  PT Goal Formulation With patient  Time For Goal Achievement 04/27/21  Potential to Achieve Goals Good  PT Time Calculation  PT Start Time (ACUTE ONLY) 1140  PT Stop Time (ACUTE ONLY) 1153  PT Time Calculation (min) (ACUTE ONLY) 13 min  PT General Charges  $$ ACUTE PT VISIT 1 Visit  PT Treatments  $Therapeutic Exercise 8-22 mins

## 2021-04-20 NOTE — Discharge Summary (Signed)
Discharge Summary  Patient ID: Rachel Bates MRN: [id number] DOB/AGE: 11/28/1945 75 y.o.  Admit date: 04/19/2021 Discharge date: 04/20/2021  Admission Diagnoses:  Left hip osteoarthritis  Discharge Diagnoses:  Active Problems:   Osteoarthritis of left hip   S/P hip replacement, left   Past Medical History:  Diagnosis Date   AICD (automatic cardioverter/defibrillator) present dual   [employer] ---  original placedment 2007/  generator change 2014 by dr gregg taylor   Anemia    Anticoagulant long-term use    eliquis   Anxiety    Arthralgia of multiple joints    Arthritis pain    Benign hypertensive heart disease    CAD (coronary artery disease) primary cardiologist-- dr gregg taylor   MI and 2 stents 1995 in [place]   CHF NYHA class II, chronic, systolic ([place])    followed by dr Carleene Overlie taylor   Degenerative disc disease, lumbar    Depression    Dyslipidemia    History of basal cell carcinoma (BCC) excision    right ankle area s/p  excision in office 06/ 2019  in office   History of DVT of lower extremity yrs ago before 2012   History of MI (myocardial infarction) 1995  in [place]   History of pulmonary embolus (PE) 2012   History of ventricular tachycardia    Hyperlipidemia    Hypersomnia    Ischemic cardiomyopathy    Migraines    OA (osteoarthritis)    "all over"   Obstructive apnea    PAF (paroxysmal atrial fibrillation) ([place])    Primary localized osteoarthritis of right hip 12/18/2017   Primary localized osteoarthritis of right knee 05/28/2018   S/P coronary artery stent placement 1995   in [facility]   05-21-2018 per pt x2  stents in same coronary artery (unsure BM or DES)   Vitamin D deficiency disease     Surgeries: Procedure(s): TOTAL HIP ARTHROPLASTY on 04/19/2021   Consultants (if any):   Discharged Condition: Improved  Hospital Course: Rachel Bates is an 75 y.o. female who was admitted 04/19/2021 with a diagnosis of left hip osteoarthritis and went to  the operating room on 04/19/2021 and underwent the above named procedures.    She was given perioperative antibiotics:  Anti-infectives (From admission, onward)    Start     Dose/Rate Route Frequency Ordered Stop   04/19/21 2230  ceFAZolin (ANCEF) IVPB 2g/100 mL premix        2 g 200 mL/hr over 30 Minutes Intravenous Every 6 hours 04/19/21 2158 04/20/21 0404   04/19/21 1200  ceFAZolin (ANCEF) IVPB 2g/100 mL premix        2 g 200 mL/hr over 30 Minutes Intravenous On call to O.R. 04/19/21 1145 04/19/21 1334     .  She was given sequential compression devices, early ambulation, and home Eliquis for DVT prophylaxis.  She benefited maximally from the hospital stay and there were no complications.    Recent vital signs:  Vitals:   04/20/21 0935 04/20/21 1339  BP: (!) 103/49 (!) 108/59  Pulse: 72 76  Resp: 17 18  Temp: 98 F (36.7 C) 97.6 F (36.4 C)  SpO2: 100%     Recent laboratory studies:  Lab Results  Component Value Date   HGB 9.8 (L) 04/20/2021   HGB 14.0 03/31/2021   HGB 10.0 (L) 05/29/2018   Lab Results  Component Value Date   WBC 5.8 04/20/2021   PLT 78 (L) 04/20/2021   Lab Results  Component Value Date   INR 1.1 04/19/2021   Lab Results  Component Value Date   NA 139 04/20/2021   K 4.8 04/20/2021   CL 107 04/20/2021   CO2 25 04/20/2021   BUN 13 04/20/2021   CREATININE 0.73 04/20/2021   GLUCOSE 172 (H) 04/20/2021    Discharge Medications:   Allergies as of 04/20/2021       Reactions   Sumatriptan Succinate Other (See Comments)   Chest pain, no triptans, pt states "it makes my heart race"   Amitriptyline Other (See Comments)   Weight gain        Medication List     STOP taking these medications    acetaminophen 650 MG CR tablet Commonly known as: TYLENOL       TAKE these medications    apixaban 5 MG Tabs tablet Commonly known as: Eliquis Take 1 tablet (5 mg total) by mouth 2 (two) times daily. Obtaining lab work from PCP office.   May continue to fill.   b complex vitamins capsule Take 1 capsule by mouth daily.   B-12 PO Take 1 tablet by mouth daily.   CALCIUM 600 + D PO Take 1 tablet by mouth daily.   carvedilol 12.5 MG tablet Commonly known as: COREG Take 12.5 mg by mouth 2 (two) times daily with a meal.   Dialyvite Vitamin D 5000 125 MCG (5000 UT) capsule Generic drug: Cholecalciferol Take 5,000 Units by mouth daily.   diphenhydrAMINE 25 mg capsule Commonly known as: BENADRYL Take 25 mg by mouth daily.   FIBER PO Take 1 capsule by mouth at bedtime.   Fiber Powd Take 1 Scoop by mouth daily.   Fish Oil 1000 MG Caps Take 1,000 mg by mouth daily.   furosemide 40 MG tablet Commonly known as: LASIX Take 40 mg by mouth daily as needed for edema.   gabapentin 300 MG capsule Commonly known as: NEURONTIN Take 300 mg by mouth 3 (three) times daily.   HYDROcodone-acetaminophen 10-325 MG tablet Commonly known as: Norco Take 1 tablet by mouth every 6 (six) hours as needed.   ramipril 2.5 MG tablet Commonly known as: ALTACE Take 2.5 mg by mouth daily.   sennosides-docusate sodium 8.6-50 MG tablet Commonly known as: SENOKOT-S Take 2 tablets by mouth daily.   simvastatin 40 MG tablet Commonly known as: ZOCOR Take 40 mg by mouth at bedtime.   traZODone 50 MG tablet Commonly known as: DESYREL Take 50 mg by mouth at bedtime.   Triple Antibiotic Oint Apply 1 application topically daily as needed (wound care).   venlafaxine 75 MG tablet Commonly known as: EFFEXOR Take 75 mg by mouth daily.   vitamin E 180 MG (400 UNITS) capsule Take 400 Units by mouth daily.   zonisamide 50 MG capsule Commonly known as: ZONEGRAN Take 50-100 mg by mouth See admin instructions. Take 50mg  by mouth in the morning and 100mg  at night.        Diagnostic Studies: DG Hip Port Unilat With Pelvis 1V Left  Result Date: 04/19/2021 CLINICAL DATA:  Left hip replacement this afternoon. EXAM: DG HIP (WITH OR  WITHOUT PELVIS) 1V PORT LEFT COMPARISON:  12/18/2017 FINDINGS: Previous right hip arthroplasty. New left hip arthroplasty. Non cemented femoral components and single screw fixation of the acetabular components bilaterally. Components for both appear well seated. Soft tissue gas on the left is consistent with history of recent surgery. No evidence of acute fracture or dislocation. IMPRESSION: Left total hip arthroplasty.  Components  appear well seated. Electronically Signed   By: Lucienne Capers M.D.   On: 04/19/2021 19:49    Disposition: Discharge disposition: 01-Home or Self Care          Follow-up Information     Marchia Bond, MD. Go on 05/02/2021.   Specialty: Orthopedic Surgery Why: Your appointment is scheduled for 11:30 Contact information: [place] [address] [phone]         Therapy, [facility] on 04/21/2021.   Specialty: Physical Therapy Why: Your outpaitent physical therapy appointment is scheduled fro 11:00. please arrive a few minutes early to complete your paperwork Contact information: [place] [address] [phone]                  Signed: Ventura Bruns PA-C 04/20/2021, 2:07 PM

## 2021-04-20 NOTE — Evaluation (Signed)
Physical Therapy Evaluation Patient Details Name: Rachel Bates MRN: [id number] DOB: 1946/02/27 Today's Date: 04/20/2021  History of Present Illness  75 yo female s/p posterior L THA, PMH: anxiety, depression, DVT, PE, MI, OA, R THA  Clinical Impression  Pt is s/p THA resulting in the deficits listed below (see PT Problem List).  Pt is doing well this    Pt will benefit from skilled PT to increase their independence and safety with mobility to allow discharge to the venue listed below.         Recommendations for follow up therapy are one component of a multi-disciplinary discharge planning process, led by the attending physician.  Recommendations may be updated based on patient status, additional functional criteria and insurance authorization.  Follow Up Recommendations Follow surgeon's recommendation for DC plan and follow-up therapies    Equipment Recommendations  None recommended by PT    Recommendations for Other Services       Precautions / Restrictions Precautions Precautions: Fall;Posterior Hip Restrictions Weight Bearing Restrictions: No Other Position/Activity Restrictions: WBAT      Mobility  Bed Mobility               General bed mobility comments: NT--pt in chair    Transfers Overall transfer level: Needs assistance Equipment used: Rolling walker (2 wheeled) Transfers: Sit to/from Stand Sit to Stand: Supervision;Min guard         General transfer comment: cues for THP, LLE position  Ambulation/Gait Ambulation/Gait assistance: Supervision Gait Distance (Feet): 240 Feet Assistive device: Rolling walker (2 wheeled) Gait Pattern/deviations: Step-to pattern;Step-through pattern;Decreased stride length     General Gait Details: able to progress to step through with improved wt shift to LLE with incr distance. good stability demonstrated with RW, no LOB  Stairs            Wheelchair Mobility    Modified Rankin (Stroke Patients  Only)       Balance                                             Pertinent Vitals/Pain Pain Assessment: No/denies pain    Home Living Family/patient expects to be discharged to:: Private residence Living Arrangements: Alone Available Help at Discharge: Family Type of Home: House Home Access: Level entry     Home Layout: One level Home Equipment: Adaptive equipment      Prior Function Level of Independence: Independent;Independent with assistive device(s)               Hand Dominance        Extremity/Trunk Assessment   Upper Extremity Assessment Upper Extremity Assessment: Overall WFL for tasks assessed    Lower Extremity Assessment Lower Extremity Assessment: LLE deficits/detail LLE Deficits / Details: grossly 3/5, ROM grossly WFL  within limits of precautions       Communication   Communication: No difficulties  Cognition Arousal/Alertness: Awake/alert Behavior During Therapy: WFL for tasks assessed/performed Overall Cognitive Status: Within Functional Limits for tasks assessed                                 General Comments: L hip bleeding  throrugh aquacel dressing, RN replaced. no additional bleeding noted after amb      General Comments      Exercises Total Joint Exercises Ankle  Circles/Pumps: AROM;Both;5 reps   Assessment/Plan    PT Assessment Patient needs continued PT services  PT Problem List Decreased strength;Decreased mobility;Decreased knowledge of precautions;Decreased knowledge of use of DME       PT Treatment Interventions DME instruction;Therapeutic activities;Gait training;Stair training;Therapeutic exercise;Patient/family education;Functional mobility training    PT Goals (Current goals can be found in the Care Plan section)  Acute Rehab PT Goals Patient Stated Goal: home today, asap PT Goal Formulation: With patient Time For Goal Achievement: 04/27/21 Potential to Achieve Goals:  Good    Frequency 7X/week   Barriers to discharge        Co-evaluation               AM-PAC PT "6 Clicks" Mobility  Outcome Measure Help needed turning from your back to your side while in a flat bed without using bedrails?: A Little Help needed moving from lying on your back to sitting on the side of a flat bed without using bedrails?: A Little Help needed moving to and from a bed to a chair (including a wheelchair)?: A Little Help needed standing up from a chair using your arms (e.g., wheelchair or bedside chair)?: A Little Help needed to walk in hospital room?: A Little Help needed climbing 3-5 steps with a railing? : A Little 6 Click Score: 18    End of Session Equipment Utilized During Treatment: Gait belt Activity Tolerance: Patient tolerated treatment well Patient left: with call bell/phone within reach;in chair Nurse Communication: Mobility status PT Visit Diagnosis: Other abnormalities of gait and mobility (R26.89)    Time: 3086-5784 PT Time Calculation (min) (ACUTE ONLY): 24 min   Charges:   PT Evaluation $PT Eval Low Complexity: 1 Low PT Treatments $Gait Training: 8-22 mins        Baxter Flattery, PT  Acute Rehab Dept ([place]) [phone] Pager [phone]  04/20/2021   [facility] 04/20/2021, 10:47 AM

## 2021-04-20 NOTE — Progress Notes (Signed)
Subjective: 1 Day Post-Op s/p Procedure(s): TOTAL HIP ARTHROPLASTY   Patient is alert, oriented, yes  Patient reports pain as moderate this morning.  Denies chest pain, SOB, Calf pain. No nausea/vomiting. No other complaints.   Objective:  PE: VITALS:   Vitals:   04/19/21 2202 04/19/21 2353 04/20/21 0202 04/20/21 0615  BP: (!) 118/57  (!) 115/57 (!) 134/53  Pulse: 79  66 70  Resp: 18  18 18   Temp: 97.9 F (36.6 C)  98 F (36.7 C) 97.7 F (36.5 C)  TempSrc: Oral  Oral Oral  SpO2: 98%  95% 98%  Weight:  81.6 kg    Height:  5\' 7"  (1.702 m)      ABD soft Sensation intact distally Intact pulses distally Dorsiflexion/Plantar flexion intact Incision: moderate drainage  LABS  Results for orders placed or performed during the hospital encounter of 04/19/21 (from the past 24 hour(s))  Type and screen [employer]     Status: None   Collection Time: 04/19/21 12:00 PM  Result Value Ref Range   ABO/RH(D) A POS    Antibody Screen NEG    Sample Expiration      04/22/2021,2359 Performed at [facility], [place] [address] [place], [place] [address]   PT- INR Day of Surgery per protocol     Status: None   Collection Time: 04/19/21 12:00 PM  Result Value Ref Range   Prothrombin Time 14.4 11.4 - 15.2 seconds   INR 1.1 0.8 - 1.2  PTT Day of Surgery per protocol     Status: None   Collection Time: 04/19/21 12:00 PM  Result Value Ref Range   aPTT 29 24 - 36 seconds  ABO/Rh     Status: None   Collection Time: 04/19/21 12:05 PM  Result Value Ref Range   ABO/RH(D)      A POS Performed at [facility], [place] [address], [place], [place] [address]   CBC     Status: Abnormal   Collection Time: 04/20/21  3:32 AM  Result Value Ref Range   WBC 5.8 4.0 - 10.5 K/uL   RBC 3.24 (L) 3.87 - 5.11 MIL/uL   Hemoglobin 9.8 (L) 12.0 - 15.0 g/dL   HCT 29.7 (L) 36.0 - 46.0 %   MCV 91.7 80.0 - 100.0 fL   MCH 30.2 26.0 - 34.0 pg    MCHC 33.0 30.0 - 36.0 g/dL   RDW 14.7 11.5 - 15.5 %   Platelets 78 (L) 150 - 400 K/uL   nRBC 0.0 0.0 - 0.2 %  Basic metabolic panel     Status: Abnormal   Collection Time: 04/20/21  3:32 AM  Result Value Ref Range   Sodium 139 135 - 145 mmol/L   Potassium 4.8 3.5 - 5.1 mmol/L   Chloride 107 98 - 111 mmol/L   CO2 25 22 - 32 mmol/L   Glucose, Bld 172 (H) 70 - 99 mg/dL   BUN 13 8 - 23 mg/dL   Creatinine, Ser 0.73 0.44 - 1.00 mg/dL   Calcium 8.8 (L) 8.9 - 10.3 mg/dL   GFR, Estimated >60 >60 mL/min   Anion gap 7 5 - 15    DG Hip Port Unilat With Pelvis 1V Left  Result Date: 04/19/2021 CLINICAL DATA:  Left hip replacement this afternoon. EXAM: DG HIP (WITH OR WITHOUT PELVIS) 1V PORT LEFT COMPARISON:  12/18/2017 FINDINGS: Previous right hip arthroplasty. New left hip arthroplasty. Non cemented femoral  components and single screw fixation of the acetabular components bilaterally. Components for both appear well seated. Soft tissue gas on the left is consistent with history of recent surgery. No evidence of acute fracture or dislocation. IMPRESSION: Left total hip arthroplasty.  Components appear well seated. Electronically Signed   By: Lucienne Capers M.D.   On: 04/19/2021 19:49    Assessment/Plan: Active Problems:   Osteoarthritis of left hip   S/P hip replacement, left  1 Day Post-Op s/p Procedure(s): TOTAL HIP ARTHROPLASTY -overall doing well, planning to work with PT today - Hbg drop post-op but not at transfusion threshold, asymptomatic  Weightbearing: WBAT LLE Insicional and dressing care: Reinforce dressings as needed, dressing changed twice. New aquacell may need to be placed prior to discharge if significant drainage after PT VTE prophylaxis: Home eliquis restarted today Pain control: continue current regimen Follow - up plan: 2 weeks with Dr. Mardelle Matte Dispo: pending PT eval today, likely discharge home this afternoon  Contact information:   Weekdays 8-5 Merlene Pulling, PA-C  [phone] A fter hours and holidays please check [email or web address] for group call information for [facility]  Ventura Bruns 04/20/2021, 9:13 AM

## 2021-04-20 NOTE — TOC Transition Note (Signed)
Transition of Care [facility]) - CM/SW Discharge Note  Patient Details  Name: Rachel Bates MRN: [id number] Date of Birth: [birth year]  [facility] CM/SW Contact:  Sherie Don, LCSW Phone Number: 04/20/2021, 11:36 AM  Clinical Narrative: Patient is expected to discharge home after working with PT. CSW met with patient to confirm discharge plan. Patient will discharge home with OPPT at [facility] in [place]. Patient has a rolling walker and elevated toilets at home, so there are no DME needs at this time. TOC signing off.  Final next level of care: OP Rehab Barriers to Discharge: No Barriers Identified  Patient Goals and CMS Choice Patient states their goals for this hospitalization and ongoing recovery are:: Discharge home with [facility] CMS Medicare.gov Compare Post Acute Care list provided to:: Patient Choice offered to / list presented to : Patient  Discharge Plan and Services        DME Arranged: N/A DME Agency: NA  Readmission Risk Interventions No flowsheet data found.

## 2021-04-21 DIAGNOSIS — M25552 Pain in left hip: Secondary | ICD-10-CM | POA: Diagnosis not present

## 2021-04-21 DIAGNOSIS — Z96642 Presence of left artificial hip joint: Secondary | ICD-10-CM | POA: Diagnosis not present

## 2021-04-21 DIAGNOSIS — M1612 Unilateral primary osteoarthritis, left hip: Secondary | ICD-10-CM | POA: Diagnosis not present

## 2021-04-23 ENCOUNTER — Encounter (HOSPITAL_COMMUNITY): Payer: Self-pay | Admitting: Orthopedic Surgery

## 2021-04-25 DIAGNOSIS — Z96642 Presence of left artificial hip joint: Secondary | ICD-10-CM | POA: Diagnosis not present

## 2021-04-25 DIAGNOSIS — M25552 Pain in left hip: Secondary | ICD-10-CM | POA: Diagnosis not present

## 2021-04-25 DIAGNOSIS — M1612 Unilateral primary osteoarthritis, left hip: Secondary | ICD-10-CM | POA: Diagnosis not present

## 2021-04-29 DIAGNOSIS — M25562 Pain in left knee: Secondary | ICD-10-CM | POA: Diagnosis not present

## 2021-04-29 DIAGNOSIS — M1612 Unilateral primary osteoarthritis, left hip: Secondary | ICD-10-CM | POA: Diagnosis not present

## 2021-05-02 DIAGNOSIS — Z96642 Presence of left artificial hip joint: Secondary | ICD-10-CM | POA: Diagnosis not present

## 2021-05-02 DIAGNOSIS — M25552 Pain in left hip: Secondary | ICD-10-CM | POA: Diagnosis not present

## 2021-05-02 DIAGNOSIS — M1612 Unilateral primary osteoarthritis, left hip: Secondary | ICD-10-CM | POA: Diagnosis not present

## 2021-05-05 DIAGNOSIS — M1612 Unilateral primary osteoarthritis, left hip: Secondary | ICD-10-CM | POA: Diagnosis not present

## 2021-05-05 DIAGNOSIS — Z96642 Presence of left artificial hip joint: Secondary | ICD-10-CM | POA: Diagnosis not present

## 2021-05-05 DIAGNOSIS — M25552 Pain in left hip: Secondary | ICD-10-CM | POA: Diagnosis not present

## 2021-05-06 DIAGNOSIS — M1612 Unilateral primary osteoarthritis, left hip: Secondary | ICD-10-CM | POA: Diagnosis not present

## 2021-05-09 DIAGNOSIS — Z96642 Presence of left artificial hip joint: Secondary | ICD-10-CM | POA: Diagnosis not present

## 2021-05-09 DIAGNOSIS — M1612 Unilateral primary osteoarthritis, left hip: Secondary | ICD-10-CM | POA: Diagnosis not present

## 2021-05-09 DIAGNOSIS — M25552 Pain in left hip: Secondary | ICD-10-CM | POA: Diagnosis not present

## 2021-05-16 DIAGNOSIS — M25552 Pain in left hip: Secondary | ICD-10-CM | POA: Diagnosis not present

## 2021-05-16 DIAGNOSIS — M1612 Unilateral primary osteoarthritis, left hip: Secondary | ICD-10-CM | POA: Diagnosis not present

## 2021-05-16 DIAGNOSIS — Z96642 Presence of left artificial hip joint: Secondary | ICD-10-CM | POA: Diagnosis not present

## 2021-05-23 DIAGNOSIS — Z96642 Presence of left artificial hip joint: Secondary | ICD-10-CM | POA: Diagnosis not present

## 2021-05-23 DIAGNOSIS — M25552 Pain in left hip: Secondary | ICD-10-CM | POA: Diagnosis not present

## 2021-05-23 DIAGNOSIS — M1612 Unilateral primary osteoarthritis, left hip: Secondary | ICD-10-CM | POA: Diagnosis not present

## 2021-05-25 DIAGNOSIS — I4891 Unspecified atrial fibrillation: Secondary | ICD-10-CM | POA: Diagnosis not present

## 2021-05-25 DIAGNOSIS — D696 Thrombocytopenia, unspecified: Secondary | ICD-10-CM | POA: Diagnosis not present

## 2021-05-25 DIAGNOSIS — Z6828 Body mass index (BMI) 28.0-28.9, adult: Secondary | ICD-10-CM | POA: Diagnosis not present

## 2021-05-25 DIAGNOSIS — R739 Hyperglycemia, unspecified: Secondary | ICD-10-CM | POA: Diagnosis not present

## 2021-05-25 DIAGNOSIS — Z95810 Presence of automatic (implantable) cardiac defibrillator: Secondary | ICD-10-CM | POA: Diagnosis not present

## 2021-05-25 DIAGNOSIS — I10 Essential (primary) hypertension: Secondary | ICD-10-CM | POA: Diagnosis not present

## 2021-05-25 DIAGNOSIS — E782 Mixed hyperlipidemia: Secondary | ICD-10-CM | POA: Diagnosis not present

## 2021-05-25 DIAGNOSIS — I2510 Atherosclerotic heart disease of native coronary artery without angina pectoris: Secondary | ICD-10-CM | POA: Diagnosis not present

## 2021-05-25 DIAGNOSIS — Z79899 Other long term (current) drug therapy: Secondary | ICD-10-CM | POA: Diagnosis not present

## 2021-05-25 DIAGNOSIS — F339 Major depressive disorder, recurrent, unspecified: Secondary | ICD-10-CM | POA: Diagnosis not present

## 2021-05-25 DIAGNOSIS — M539 Dorsopathy, unspecified: Secondary | ICD-10-CM | POA: Diagnosis not present

## 2021-05-30 ENCOUNTER — Ambulatory Visit (INDEPENDENT_AMBULATORY_CARE_PROVIDER_SITE_OTHER): Payer: Medicare Other | Admitting: Internal Medicine

## 2021-05-30 ENCOUNTER — Encounter: Payer: Self-pay | Admitting: Internal Medicine

## 2021-05-30 ENCOUNTER — Other Ambulatory Visit: Payer: Self-pay

## 2021-05-30 VITALS — BP 138/72 | HR 77 | Ht 67.0 in | Wt 178.4 lb

## 2021-05-30 DIAGNOSIS — I480 Paroxysmal atrial fibrillation: Secondary | ICD-10-CM

## 2021-05-30 DIAGNOSIS — I255 Ischemic cardiomyopathy: Secondary | ICD-10-CM

## 2021-05-30 DIAGNOSIS — I5022 Chronic systolic (congestive) heart failure: Secondary | ICD-10-CM | POA: Diagnosis not present

## 2021-05-30 DIAGNOSIS — I4729 Other ventricular tachycardia: Secondary | ICD-10-CM

## 2021-05-30 DIAGNOSIS — Z95810 Presence of automatic (implantable) cardiac defibrillator: Secondary | ICD-10-CM

## 2021-05-30 DIAGNOSIS — M1712 Unilateral primary osteoarthritis, left knee: Secondary | ICD-10-CM | POA: Diagnosis not present

## 2021-05-30 DIAGNOSIS — M1612 Unilateral primary osteoarthritis, left hip: Secondary | ICD-10-CM | POA: Diagnosis not present

## 2021-05-30 NOTE — Patient Instructions (Signed)
Medication Instructions:  Your physician recommends that you continue on your current medications as directed. Please refer to the Current Medication list given to you today.  Labwork: None ordered.  Testing/Procedures: None ordered.  Follow-Up: Your physician wants you to follow-up in: one year with Gregg Taylor, MD or one of the following Advanced Practice Providers on your designated Care Team:   Renee Ursuy, PA-C Michael "Andy" Tillery, PA-C  Remote monitoring is used to monitor your ICD from home. This monitoring reduces the number of office visits required to check your device to one time per year. It allows us to keep an eye on the functioning of your device to ensure it is working properly. You are scheduled for a device check from home on 06/07/2021. You may send your transmission at any time that day. If you have a wireless device, the transmission will be sent automatically. After your physician reviews your transmission, you will receive a postcard with your next transmission date.  Any Other Special Instructions Will Be Listed Below (If Applicable).  If you need a refill on your cardiac medications before your next appointment, please call your pharmacy.      

## 2021-05-30 NOTE — Progress Notes (Signed)
HPI Rachel Bates returns today for followup. She is a pleasant 75 yo woman with an ICM, PAF, s/p ICD insertion. She has done well in the interim with no chest pain or sob. No syncope. No ICD therapies.  Since her last visit, she has undergone hip replacement and then approximately 1 week after the surgery fell on her hip.  She appears to have recovered.  She is slowly progressing with her rehab.  She denies chest pain or shortness of breath though she is somewhat limited in her activity by her replaced hip.  Allergies  Allergen Reactions   Sumatriptan Succinate Other (See Comments)    Chest pain, no triptans, pt states "it makes my heart race"   Amitriptyline Other (See Comments)    Weight gain     Current Outpatient Medications  Medication Sig Dispense Refill   apixaban (ELIQUIS) 5 MG TABS tablet Take 1 tablet (5 mg total) by mouth 2 (two) times daily. Obtaining lab work from PCP office.  May continue to fill. 60 tablet 11   b complex vitamins capsule Take 1 capsule by mouth daily.     Calcium Carb-Cholecalciferol (CALCIUM 600 + D PO) Take 1 tablet by mouth daily.     carvedilol (COREG) 12.5 MG tablet Take 12.5 mg by mouth 2 (two) times daily with a meal.      Cholecalciferol (DIALYVITE VITAMIN D 5000) 125 MCG (5000 UT) capsule Take 5,000 Units by mouth daily.     Cyanocobalamin (B-12 PO) Take 1 tablet by mouth daily.     diphenhydrAMINE (BENADRYL) 25 mg capsule Take 25 mg by mouth daily.     FIBER PO Take 1 capsule by mouth at bedtime.     Fiber POWD Take 1 Scoop by mouth daily.     furosemide (LASIX) 40 MG tablet Take 40 mg by mouth daily as needed for edema.     gabapentin (NEURONTIN) 300 MG capsule Take 300 mg by mouth 3 (three) times daily.     Neomycin-Bacitracin-Polymyxin (TRIPLE ANTIBIOTIC) OINT Apply 1 application topically daily as needed (wound care).     Omega-3 Fatty Acids (FISH OIL) 1000 MG CAPS Take 1,000 mg by mouth daily.      ramipril (ALTACE) 2.5 MG tablet  Take 2.5 mg by mouth daily.       simvastatin (ZOCOR) 40 MG tablet Take 40 mg by mouth at bedtime.       traZODone (DESYREL) 50 MG tablet Take 50 mg by mouth at bedtime.      venlafaxine (EFFEXOR) 75 MG tablet Take 75 mg by mouth daily.     vitamin E 400 UNIT capsule Take 400 Units by mouth daily.     zonisamide (ZONEGRAN) 50 MG capsule Take 50-100 mg by mouth See admin instructions. Take 50mg  by mouth in the morning and 100mg  at night.     HYDROcodone-acetaminophen (NORCO) 10-325 MG tablet Take 1 tablet by mouth every 6 (six) hours as needed. 28 tablet 0   sennosides-docusate sodium (SENOKOT-S) 8.6-50 MG tablet Take 2 tablets by mouth daily. 30 tablet 1   No current facility-administered medications for this visit.     Past Medical History:  Diagnosis Date   AICD (automatic cardioverter/defibrillator) present dual   Medtronic ---  original placedment 2007/  generator change 2014 by dr Rachel Bates Rachel Bates   Anemia    Anticoagulant long-term use    eliquis   Anxiety    Arthralgia of multiple joints    Arthritis  pain    Benign hypertensive heart disease    CAD (coronary artery disease) primary cardiologist-- dr Rachel Bates   MI and 2 stents 1995 in [facility]   CHF NYHA class II, chronic, systolic ([place])    followed by dr Rachel Bates Rachel Bates   Degenerative disc disease, lumbar    Depression    Dyslipidemia    History of basal cell carcinoma (BCC) excision    right ankle area s/p  excision in office 06/ 2019  in office   History of DVT of lower extremity yrs ago before 2012   History of MI (myocardial infarction) 1995  in [place]   History of pulmonary embolus (PE) 2012   History of ventricular tachycardia    Hyperlipidemia    Hypersomnia    Ischemic cardiomyopathy    Migraines    OA (osteoarthritis)    "all over"   Obstructive apnea    PAF (paroxysmal atrial fibrillation) (HCC)    Primary localized osteoarthritis of right hip 12/18/2017   Primary localized osteoarthritis of right knee  05/28/2018   S/P coronary artery stent placement 1995   in [facility]   05-21-2018 per pt x2  stents in same coronary artery (unsure BM or DES)   Vitamin D deficiency disease     ROS:   All systems reviewed and negative except as noted in the HPI.   Past Surgical History:  Procedure Laterality Date   CARDIAC DEFIBRILLATOR PLACEMENT  2007   CATARACT EXTRACTION, BILATERAL     COLONOSCOPY  04/14/2013   colonic polyp, status post polypectomy. Mild panocolonic diverticulosis. Small internal hemorrhoids   DILATION AND CURETTAGE OF UTERUS  yrs ago   IMPLANTABLE CARDIOVERTER DEFIBRILLATOR GENERATOR CHANGE N/A 02/04/2013   Procedure: IMPLANTABLE CARDIOVERTER DEFIBRILLATOR GENERATOR CHANGE;  Surgeon: Evans Lance, MD;  Location: [facility] CATH LAB;  Service: Cardiovascular;  Laterality: N/A;   SHOULDER ARTHROSCOPY Right 2015   TOTAL HIP ARTHROPLASTY Right 12/18/2017   Procedure: RIGHT TOTAL HIP ARTHROPLASTY;  Surgeon: Marchia Bond, MD;  Location: [place];  Service: Orthopedics;  Laterality: Right;   TOTAL HIP ARTHROPLASTY Left 04/19/2021   Procedure: TOTAL HIP ARTHROPLASTY;  Surgeon: Marchia Bond, MD;  Location: [facility];  Service: Orthopedics;  Laterality: Left;   TOTAL KNEE ARTHROPLASTY Right 05/28/2018   Procedure: TOTAL KNEE ARTHROPLASTY;  Surgeon: Marchia Bond, MD;  Location: [facility];  Service: Orthopedics;  Laterality: Right;  Adductor Block   TUBAL LIGATION Bilateral yrs ago   65 TOOTH EXTRACTION       Family History  Problem Relation Age of Onset   Hypertension Mother    Thyroid disease Mother    Alzheimer's disease Mother    Coronary artery disease Father    Pulmonary embolism Father    Congestive Heart Failure Maternal Grandmother    Hypertension Maternal Grandmother    Heart attack Maternal Grandfather    Other Maternal Grandfather        carotid disease   Dementia Paternal Grandmother    Other Paternal Grandfather 7       accident     Social History   Socioeconomic  History   Marital status: Single    Spouse name: Not on file   Number of children: Not on file   Years of education: Not on file   Highest education level: Not on file  Occupational History   Not on file  Tobacco Use   Smoking status: Former    Years: 30.00    Types: Cigarettes  Quit date: 05/21/1996    Years since quitting: 25.0   Smokeless tobacco: Never  Vaping Use   Vaping Use: Never used  Substance and Sexual Activity   Alcohol use: No   Drug use: Never   Sexual activity: Not on file  Other Topics Concern   Not on file  Social History Narrative   Not on file   Social Determinants of Health   Financial Resource Strain: Not on file  Food Insecurity: Not on file  Transportation Needs: Not on file  Physical Activity: Not on file  Stress: Not on file  Social Connections: Not on file  Intimate Partner Violence: Not on file     BP 138/72   Pulse 77   Ht 5\' 7"  (1.702 m)   Wt 178 lb 6.4 oz (80.9 kg)   SpO2 97%   BMI 27.94 kg/m   Physical Exam:  Well appearing, NAD HEENT: Unremarkable Neck:  No JVD, no thyromegally Lymphatics:  No adenopathy Back:  No CVA tenderness Lungs:  Clear, with no wheezes, rales, or rhonchi HEART:  Regular rate rhythm, no murmurs, no rubs, no clicks Abd:  soft, positive bowel sounds, no organomegally, no rebound, no guarding Ext:  2 plus pulses, no edema, no cyanosis, no clubbing Skin:  No rashes no nodules Neuro:  CN II through XII intact, motor grossly intact  EKG -normal sinus rhythm with prior inferior myocardial infarction and poor R wave progression  DEVICE  Normal device function.  See PaceArt for details.   Assess/Plan:  1. ICM - she denies anginal symptoms. She will continue her current meds. 2. ICD - her medtronic PPM is working normally. 3. VT - she has not had any recurrent sustained VT since her last visit. 4. PAF - she is remaining in NSR. She will continue her eliquis.   Rachel Bates Cisco Kindt,MD

## 2021-05-31 DIAGNOSIS — Z96642 Presence of left artificial hip joint: Secondary | ICD-10-CM | POA: Diagnosis not present

## 2021-05-31 DIAGNOSIS — M1612 Unilateral primary osteoarthritis, left hip: Secondary | ICD-10-CM | POA: Diagnosis not present

## 2021-05-31 DIAGNOSIS — M25552 Pain in left hip: Secondary | ICD-10-CM | POA: Diagnosis not present

## 2021-06-06 DIAGNOSIS — Z96642 Presence of left artificial hip joint: Secondary | ICD-10-CM | POA: Diagnosis not present

## 2021-06-06 DIAGNOSIS — M1612 Unilateral primary osteoarthritis, left hip: Secondary | ICD-10-CM | POA: Diagnosis not present

## 2021-06-06 DIAGNOSIS — M25552 Pain in left hip: Secondary | ICD-10-CM | POA: Diagnosis not present

## 2021-06-09 DIAGNOSIS — M1612 Unilateral primary osteoarthritis, left hip: Secondary | ICD-10-CM | POA: Diagnosis not present

## 2021-06-09 DIAGNOSIS — Z96642 Presence of left artificial hip joint: Secondary | ICD-10-CM | POA: Diagnosis not present

## 2021-06-09 DIAGNOSIS — M25552 Pain in left hip: Secondary | ICD-10-CM | POA: Diagnosis not present

## 2021-06-13 DIAGNOSIS — M25552 Pain in left hip: Secondary | ICD-10-CM | POA: Diagnosis not present

## 2021-06-13 DIAGNOSIS — Z96642 Presence of left artificial hip joint: Secondary | ICD-10-CM | POA: Diagnosis not present

## 2021-06-13 DIAGNOSIS — M1612 Unilateral primary osteoarthritis, left hip: Secondary | ICD-10-CM | POA: Diagnosis not present

## 2021-06-16 DIAGNOSIS — Z96642 Presence of left artificial hip joint: Secondary | ICD-10-CM | POA: Diagnosis not present

## 2021-06-16 DIAGNOSIS — M25552 Pain in left hip: Secondary | ICD-10-CM | POA: Diagnosis not present

## 2021-06-16 DIAGNOSIS — M1612 Unilateral primary osteoarthritis, left hip: Secondary | ICD-10-CM | POA: Diagnosis not present

## 2021-06-27 DIAGNOSIS — M1712 Unilateral primary osteoarthritis, left knee: Secondary | ICD-10-CM | POA: Diagnosis not present

## 2021-06-27 DIAGNOSIS — M1612 Unilateral primary osteoarthritis, left hip: Secondary | ICD-10-CM | POA: Diagnosis not present

## 2021-07-12 DIAGNOSIS — M1712 Unilateral primary osteoarthritis, left knee: Secondary | ICD-10-CM | POA: Diagnosis present

## 2021-07-12 NOTE — Progress Notes (Signed)
Sent message, via epic in basket, requesting orders in epic from surgeon.  

## 2021-07-18 ENCOUNTER — Other Ambulatory Visit: Payer: Self-pay | Admitting: Internal Medicine

## 2021-07-18 DIAGNOSIS — M1712 Unilateral primary osteoarthritis, left knee: Secondary | ICD-10-CM | POA: Diagnosis not present

## 2021-07-18 DIAGNOSIS — Z5181 Encounter for therapeutic drug level monitoring: Secondary | ICD-10-CM

## 2021-07-18 DIAGNOSIS — I2782 Chronic pulmonary embolism: Secondary | ICD-10-CM

## 2021-07-18 DIAGNOSIS — Z86718 Personal history of other venous thrombosis and embolism: Secondary | ICD-10-CM

## 2021-07-18 DIAGNOSIS — I480 Paroxysmal atrial fibrillation: Secondary | ICD-10-CM

## 2021-07-21 NOTE — Patient Instructions (Signed)
DUE TO COVID-19 ONLY ONE VISITOR IS ALLOWED TO COME WITH YOU AND STAY IN THE WAITING ROOM ONLY DURING PRE OP AND PROCEDURE.   **NO VISITORS ARE ALLOWED IN THE SHORT STAY AREA OR RECOVERY ROOM!!**  IF YOU WILL BE ADMITTED INTO THE HOSPITAL YOU ARE ALLOWED ONLY TWO SUPPORT PEOPLE DURING VISITATION HOURS ONLY (7 AM -8PM)   The support person(s) must pass our screening, gel in and out, and wear a mask at all times, including in the patients room. Patients must also wear a mask when staff or their support person are in the room. Visitors GUEST BADGE MUST BE WORN VISIBLY  One adult visitor may remain with you overnight and MUST be in the room by 8 P.M.  No visitors under the age of 20. Any visitor under the age of 50 must be accompanied by an adult.        Your procedure is scheduled on: 08/02/21   Report to [facility] Main Entrance    Report to admitting at : 7:45 AM   Call this number if you have problems the morning of surgery [phone]   Do not eat food :After Midnight.   May have liquids until : 7:30 AM   day of surgery  CLEAR LIQUID DIET  Foods Allowed                                                                     Foods Excluded  Water, Black Coffee and tea, regular and decaf                             liquids that you cannot  Plain Jell-O in any flavor  (No red)                                           see through such as: Fruit ices (not with fruit pulp)                                     milk, soups, orange juice              Iced Popsicles (No red)                                    All solid food                                   Apple juices Sports drinks like Gatorade (No red) Lightly seasoned clear broth or consume(fat free) Sugar  Sample Menu Breakfast                                Lunch  Supper Cranberry juice                    Beef broth                            Chicken broth Jell-O                                      Grape juice                           Apple juice Coffee or tea                        Jell-O                                      Popsicle                                                Coffee or tea                        Coffee or tea     Complete one Ensure drink the morning of surgery 3 hours prior to scheduled surgery at 7:30 AM.    The day of surgery:  Drink ONE (1) Pre-Surgery Clear Ensure or G2 at AM the morning of surgery. Drink in one sitting. Do not sip.  This drink was given to you during your hospital  pre-op appointment visit. Nothing else to drink after completing the  Pre-Surgery Clear Ensure or G2.          If you have questions, please contact your surgeons office.     Oral Hygiene is also important to reduce your risk of infection.                                    Remember - BRUSH YOUR TEETH THE MORNING OF SURGERY WITH YOUR REGULAR TOOTHPASTE   Do NOT smoke after Midnight   Take these medicines the morning of surgery with A SIP OF WATER: gabapentin,zonegran,effexor.  DO NOT TAKE ANY ORAL DIABETIC MEDICATIONS DAY OF YOUR SURGERY                              You may not have any metal on your body including hair pins, jewelry, and body piercing             Do not wear make-up, lotions, powders, perfumes/cologne, or deodorant  Do not wear nail polish including gel and S&S, artificial/acrylic nails, or any other type of covering on natural nails including finger and toenails. If you have artificial nails, gel coating, etc. that needs to be removed by a nail salon please have this removed prior to surgery or surgery may need to be canceled/ delayed if the surgeon/ anesthesia feels like they are unable to be safely monitored.   Do not shave  48 hours prior to  surgery.               Men may shave face and neck.   Do not bring valuables to the hospital. [place].   Contacts, dentures or bridgework may not  be worn into surgery.   Bring small overnight bag day of surgery.    Patients discharged on the day of surgery will not be allowed to drive home.  Someone needs to stay with you for the first 24 hours after anesthesia.   Special Instructions: Bring a copy of your healthcare power of attorney and living will documents         the day of surgery if you haven't scanned them before.              Please read over the following fact sheets you were given: IF YOU HAVE QUESTIONS ABOUT YOUR PRE-OP INSTRUCTIONS PLEASE CALL [phone]     [facility] - Preparing for Surgery Before surgery, you can play an important role.  Because skin is not sterile, your skin needs to be as free of germs as possible.  You can reduce the number of germs on your skin by washing with CHG (chlorahexidine gluconate) soap before surgery.  CHG is an antiseptic cleaner which kills germs and bonds with the skin to continue killing germs even after washing. Please DO NOT use if you have an allergy to CHG or antibacterial soaps.  If your skin becomes reddened/irritated stop using the CHG and inform your nurse when you arrive at Short Stay. Do not shave (including legs and underarms) for at least 48 hours prior to the first CHG shower.  You may shave your face/neck. Please follow these instructions carefully:  1.  Shower with CHG Soap the night before surgery and the  morning of Surgery.  2.  If you choose to wash your hair, wash your hair first as usual with your  normal  shampoo.  3.  After you shampoo, rinse your hair and body thoroughly to remove the  shampoo.                           4.  Use CHG as you would any other liquid soap.  You can apply chg directly  to the skin and wash                       Gently with a scrungie or clean washcloth.  5.  Apply the CHG Soap to your body ONLY FROM THE NECK DOWN.   Do not use on face/ open                           Wound or open sores. Avoid contact with eyes, ears mouth and genitals  (private parts).                       Wash face,  Genitals (private parts) with your normal soap.             6.  Wash thoroughly, paying special attention to the area where your surgery  will be performed.  7.  Thoroughly rinse your body with warm water from the neck down.  8.  DO NOT shower/wash with your normal soap after using and rinsing off  the CHG Soap.                9.  Pat yourself dry with a clean towel.            10.  Wear clean pajamas.            11.  Place clean sheets on your bed the night of your first shower and do not  sleep with pets. Day of Surgery : Do not apply any lotions/deodorants the morning of surgery.  Please wear clean clothes to the hospital/surgery center.  FAILURE TO FOLLOW THESE INSTRUCTIONS MAY RESULT IN THE CANCELLATION OF YOUR SURGERY PATIENT SIGNATURE_________________________________  NURSE SIGNATURE__________________________________  ________________________________________________________________________   Adam Phenix  An incentive spirometer is a tool that can help keep your lungs clear and active. This tool measures how well you are filling your lungs with each breath. Taking long deep breaths may help reverse or decrease the chance of developing breathing (pulmonary) problems (especially infection) following: A long period of time when you are unable to move or be active. BEFORE THE PROCEDURE  If the spirometer includes an indicator to show your best effort, your nurse or respiratory therapist will set it to a desired goal. If possible, sit up straight or lean slightly forward. Try not to slouch. Hold the incentive spirometer in an upright position. INSTRUCTIONS FOR USE  Sit on the edge of your bed if possible, or sit up as far as you can in bed or on a chair. Hold the incentive spirometer in an upright position. Breathe out normally. Place the mouthpiece in your mouth and seal your lips tightly around it. Breathe in slowly and as  deeply as possible, raising the piston or the ball toward the top of the column. Hold your breath for 3-5 seconds or for as long as possible. Allow the piston or ball to fall to the bottom of the column. Remove the mouthpiece from your mouth and breathe out normally. Rest for a few seconds and repeat Steps 1 through 7 at least 10 times every 1-2 hours when you are awake. Take your time and take a few normal breaths between deep breaths. The spirometer may include an indicator to show your best effort. Use the indicator as a goal to work toward during each repetition. After each set of 10 deep breaths, practice coughing to be sure your lungs are clear. If you have an incision (the cut made at the time of surgery), support your incision when coughing by placing a pillow or rolled up towels firmly against it. Once you are able to get out of bed, walk around indoors and cough well. You may stop using the incentive spirometer when instructed by your caregiver.  RISKS AND COMPLICATIONS Take your time so you do not get dizzy or light-headed. If you are in pain, you may need to take or ask for pain medication before doing incentive spirometry. It is harder to take a deep breath if you are having pain. AFTER USE Rest and breathe slowly and easily. It can be helpful to keep track of a log of your progress. Your caregiver can provide you with a simple table to help with this. If you are using the spirometer at home, follow these instructions: [place] IF:  You are having difficultly using the spirometer. You have trouble using the spirometer as often as instructed. Your pain medication is not giving enough relief while using the spirometer. You develop fever of 100.5 F (  38.1 C) or higher. SEEK IMMEDIATE MEDICAL CARE IF:  You cough up bloody sputum that had not been present before. You develop fever of 102 F (38.9 C) or greater. You develop worsening pain at or near the incision site. MAKE  SURE YOU:  Understand these instructions. Will watch your condition. Will get help right away if you are not doing well or get worse. Document Released: 11/06/2006 Document Revised: 09/18/2011 Document Reviewed: 01/07/2007 [facility] Patient Information 2014 [place], [place].   ________________________________________________________________________

## 2021-07-22 ENCOUNTER — Encounter: Payer: Self-pay | Admitting: Internal Medicine

## 2021-07-22 ENCOUNTER — Other Ambulatory Visit: Payer: Self-pay

## 2021-07-22 ENCOUNTER — Encounter (HOSPITAL_COMMUNITY): Payer: Self-pay

## 2021-07-22 ENCOUNTER — Encounter (HOSPITAL_COMMUNITY)
Admission: RE | Admit: 2021-07-22 | Discharge: 2021-07-22 | Disposition: A | Discharge status: Home / Self Care | Payer: Medicare Other | Source: Ambulatory Visit | Attending: Orthopedic Surgery | Admitting: Orthopedic Surgery

## 2021-07-22 VITALS — BP 137/68 | HR 72 | Temp 98.3°F | Ht 67.0 in | Wt 172.0 lb

## 2021-07-22 DIAGNOSIS — Z01812 Encounter for preprocedural laboratory examination: Secondary | ICD-10-CM | POA: Diagnosis not present

## 2021-07-22 DIAGNOSIS — M1712 Unilateral primary osteoarthritis, left knee: Secondary | ICD-10-CM | POA: Insufficient documentation

## 2021-07-22 DIAGNOSIS — Z95810 Presence of automatic (implantable) cardiac defibrillator: Secondary | ICD-10-CM | POA: Insufficient documentation

## 2021-07-22 DIAGNOSIS — I255 Ischemic cardiomyopathy: Secondary | ICD-10-CM | POA: Diagnosis not present

## 2021-07-22 DIAGNOSIS — G4733 Obstructive sleep apnea (adult) (pediatric): Secondary | ICD-10-CM | POA: Insufficient documentation

## 2021-07-22 DIAGNOSIS — Z955 Presence of coronary angioplasty implant and graft: Secondary | ICD-10-CM | POA: Diagnosis not present

## 2021-07-22 DIAGNOSIS — Z86711 Personal history of pulmonary embolism: Secondary | ICD-10-CM | POA: Insufficient documentation

## 2021-07-22 DIAGNOSIS — Z87891 Personal history of nicotine dependence: Secondary | ICD-10-CM | POA: Insufficient documentation

## 2021-07-22 DIAGNOSIS — Z7901 Long term (current) use of anticoagulants: Secondary | ICD-10-CM | POA: Diagnosis not present

## 2021-07-22 DIAGNOSIS — Z86718 Personal history of other venous thrombosis and embolism: Secondary | ICD-10-CM | POA: Insufficient documentation

## 2021-07-22 DIAGNOSIS — Z01818 Encounter for other preprocedural examination: Secondary | ICD-10-CM

## 2021-07-22 DIAGNOSIS — I480 Paroxysmal atrial fibrillation: Secondary | ICD-10-CM | POA: Insufficient documentation

## 2021-07-22 DIAGNOSIS — Z96642 Presence of left artificial hip joint: Secondary | ICD-10-CM | POA: Insufficient documentation

## 2021-07-22 DIAGNOSIS — I2510 Atherosclerotic heart disease of native coronary artery without angina pectoris: Secondary | ICD-10-CM | POA: Insufficient documentation

## 2021-07-22 HISTORY — DX: Malignant (primary) neoplasm, unspecified: C80.1

## 2021-07-22 LAB — CBC
HCT: 42.7 % (ref 36.0–46.0)
Hemoglobin: 13.1 g/dL (ref 12.0–15.0)
MCH: 25.4 pg — ABNORMAL LOW (ref 26.0–34.0)
MCHC: 30.7 g/dL (ref 30.0–36.0)
MCV: 82.9 fL (ref 80.0–100.0)
Platelets: 125 10*3/uL — ABNORMAL LOW (ref 150–400)
RBC: 5.15 MIL/uL — ABNORMAL HIGH (ref 3.87–5.11)
RDW: 15.8 % — ABNORMAL HIGH (ref 11.5–15.5)
WBC: 4.9 10*3/uL (ref 4.0–10.5)
nRBC: 0 % (ref 0.0–0.2)

## 2021-07-22 LAB — BASIC METABOLIC PANEL
Anion gap: 3 — ABNORMAL LOW (ref 5–15)
BUN: 12 mg/dL (ref 8–23)
CO2: 26 mmol/L (ref 22–32)
Calcium: 8.9 mg/dL (ref 8.9–10.3)
Chloride: 106 mmol/L (ref 98–111)
Creatinine, Ser: 0.64 mg/dL (ref 0.44–1.00)
GFR, Estimated: >60 mL/min (ref >60)
Glucose, Bld: 81 mg/dL (ref 70–99)
Potassium: 3.9 mmol/L (ref 3.5–5.1)
Sodium: 135 mmol/L (ref 135–145)

## 2021-07-22 LAB — SURGICAL PCR SCREEN
MRSA, PCR: NEGATIVE
Staphylococcus aureus: POSITIVE — AB

## 2021-07-22 NOTE — Progress Notes (Signed)
[facility] DEVICE PROGRAMMING  Patient Information: Name:  Rachel Bates  DOB:  1945/12/28  MRN:  [id number]    Planned Procedure:  Left total knee  Surgeon:  Dr. Marchia Bond  Date of Procedure:  08/02/21  Cautery will be used.  Position during surgery:     Please send documentation back to:  [facility] (Fax # [phone])   Device Information:  Clinic EP Physician:  Cristopher Peru, MD   Device Type:  Defibrillator Manufacturer and Phone #:  Medtronic: [phone] Pacemaker Dependent?:  No. Date of Last Device Check:  05/30/21 Normal Device Function?:  Yes.    Electrophysiologist's Recommendations:  Have magnet available. Provide continuous ECG monitoring when magnet is used or reprogramming is to be performed.  Procedure should not interfere with device function.  No device programming or magnet placement needed.  Per Device Clinic Standing Orders, Simone Curia, RN  10:47 AM 07/22/2021

## 2021-07-22 NOTE — Progress Notes (Signed)
PCR: + STAPH °

## 2021-07-22 NOTE — Progress Notes (Signed)
COVID Vaccine Completed: Yes Date COVID Vaccine completed: 2022 x 4 COVID vaccine manufacturer:  Moderna    PCP - Ihor Dow: PA Cardiologist - Dr. Cristopher Peru. LOV: 05/30/21  Chest x-ray -  EKG - 05/30/21 Stress Test -  ECHO -  Cardiac Cath -  Pacemaker/ICD device last checked: 03/08/21  Sleep Study - Yes CPAP - NO  Fasting Blood Sugar -  Checks Blood Sugar _____ times a day  Blood Thinner Instructions: Eliquis will be held 3 days before as per Dr. Mardelle Matte instructions. Aspirin Instructions: Last Dose:  Anesthesia review: Hx: CAD,PE,DVT,MI,PAF  Patient denies shortness of breath, fever, cough and chest pain at PAT appointment   Patient verbalized understanding of instructions that were given to them at the PAT appointment. Patient was also instructed that they will need to review over the PAT instructions again at home before surgery.

## 2021-07-26 NOTE — Care Plan (Signed)
Ortho Bundle Case Management Note  Patient Details  Name: RHINA KRAMME MRN: [id number] Date of Birth: [birth year]  Met with patient in the office prior to surgery. She is well known to [place] from prior surgery. She will discharge to home with family to assist. Has equipment. HHPT referral to [place] and OPPT set up with [facility]. Patient and MD in agreement with plan. Choice offered               DME Arranged:    DME Agency:     HH Arranged:  PT HH Agency:  [facility]  Additional Comments: Please contact me with any questions of if this plan should need to change.  Ladell Heads,  [place] [facility]edic Specialist  [phone] 07/26/2021, 3:03 PM

## 2021-07-26 NOTE — Progress Notes (Signed)
Anesthesia Chart Review   Case: [id number] Date/Time: 08/02/21 1015   Procedure: TOTAL KNEE ARTHROPLASTY (Left: Knee)   Anesthesia type: Choice   Pre-op diagnosis: djd left knee   Location: [facility] ROOM 07 / [facility]   Surgeons: Marchia Bond, MD       DISCUSSION:76 y.o. former smoker with h/o OSA, ischemic cardiomyopathy, CAD (DES), PE, DVT, PAF (on Eliquis), AICD in place (device orders in 07/22/21 progress note), left knee djd scheduled for above procedure 08/02/2021 with Dr. Marchia Bond.   Advised to hold Eliquis 3 days prior to surgery.   S/p left total hip 04/19/21 with no anesthesia complications.  Cleared by cardiology prior to this procedure.  Stable since then.  Last seen by cardiology 05/30/2021, stable at this visit.   Anticipate pt can proceed with planned procedure barring acute status change.   VS: BP 137/68    Pulse 72    Temp 36.8 C (Oral)    Ht 5\' 7"  (1.702 m)    Wt 78 kg    SpO2 96%    BMI 26.94 kg/m   PROVIDERS: Renaldo Reel, PA is PCP   Cristopher Peru, MD is Cardiologist  LABS: Labs reviewed: Acceptable for surgery. (all labs ordered are listed, but only abnormal results are displayed)  Labs Reviewed  SURGICAL PCR SCREEN - Abnormal; Notable for the following components:      Result Value   Staphylococcus aureus POSITIVE (*)    All other components within normal limits  BASIC METABOLIC PANEL - Abnormal; Notable for the following components:   Anion gap 3 (*)    All other components within normal limits  CBC - Abnormal; Notable for the following components:   RBC 5.15 (*)    MCH 25.4 (*)    RDW 15.8 (*)    Platelets 125 (*)    All other components within normal limits     IMAGES:   EKG: 05/30/21 Rate 77 bpm  NSR  CV:  Past Medical History:  Diagnosis Date   AICD (automatic cardioverter/defibrillator) present dual   Medtronic ---  original placedment 2007/  generator change 2014 by dr Carleene Overlie taylor   Anemia    Anticoagulant long-term use     eliquis   Anxiety    Arthralgia of multiple joints    Arthritis pain    Benign hypertensive heart disease    CAD (coronary artery disease) primary cardiologist-- dr gregg taylor   MI and 2 stents 1995 in [place] [facility])    CHF NYHA class II, chronic, systolic ([place])    followed by dr Carleene Overlie taylor   Degenerative disc disease, lumbar    Depression    Dyslipidemia    History of basal cell carcinoma (BCC) excision    right ankle area s/p  excision in office 06/ 2019  in office   History of DVT of lower extremity yrs ago before 2012   History of MI (myocardial infarction) 1995  in [place]   History of pulmonary embolus (PE) 2012   History of ventricular tachycardia    Hyperlipidemia    Hypersomnia    Ischemic cardiomyopathy    Migraines    OA (osteoarthritis)    "all over"   Obstructive apnea    PAF (paroxysmal atrial fibrillation) ([place])    Primary localized osteoarthritis of right hip 12/18/2017   Primary localized osteoarthritis of right knee 05/28/2018   S/P coronary artery stent placement 1995   in [facility]   05-21-2018  per pt x2  stents in same coronary artery (unsure BM or DES)   Vitamin D deficiency disease     Past Surgical History:  Procedure Laterality Date   CARDIAC DEFIBRILLATOR PLACEMENT  2007   CATARACT EXTRACTION, BILATERAL     COLONOSCOPY  04/14/2013   colonic polyp, status post polypectomy. Mild panocolonic diverticulosis. Small internal hemorrhoids   DILATION AND CURETTAGE OF UTERUS  yrs ago   IMPLANTABLE CARDIOVERTER DEFIBRILLATOR GENERATOR CHANGE N/A 02/04/2013   Procedure: IMPLANTABLE CARDIOVERTER DEFIBRILLATOR GENERATOR CHANGE;  Surgeon: Evans Lance, MD;  Location: [facility] CATH LAB;  Service: Cardiovascular;  Laterality: N/A;   SHOULDER ARTHROSCOPY Right 2015   TOTAL HIP ARTHROPLASTY Right 12/18/2017   Procedure: RIGHT TOTAL HIP ARTHROPLASTY;  Surgeon: Marchia Bond, MD;  Location: [place];  Service: Orthopedics;  Laterality: Right;   TOTAL HIP ARTHROPLASTY  Left 04/19/2021   Procedure: TOTAL HIP ARTHROPLASTY;  Surgeon: Marchia Bond, MD;  [facility];  Service: Orthopedics;  Laterality: Left;   TOTAL KNEE ARTHROPLASTY Right 05/28/2018   Procedure: TOTAL KNEE ARTHROPLASTY;  Surgeon: Marchia Bond, MD;  [facility];  Service: Orthopedics;  Laterality: Right;  Adductor Block   TUBAL LIGATION Bilateral yrs ago   WISDOM TOOTH EXTRACTION      MEDICATIONS:  apixaban (ELIQUIS) 5 MG TABS tablet   b complex vitamins capsule   Calcium Carb-Cholecalciferol (CALCIUM 600 + D PO)   carvedilol (COREG) 12.5 MG tablet   Cholecalciferol (DIALYVITE VITAMIN D 5000) 125 MCG (5000 UT) capsule   Cyanocobalamin (B-12 PO)   diphenhydrAMINE (BENADRYL) 25 mg capsule   FIBER PO   Fiber POWD   furosemide (LASIX) 40 MG tablet   gabapentin (NEURONTIN) 300 MG capsule   Neomycin-Bacitracin-Polymyxin (TRIPLE ANTIBIOTIC) OINT   Omega-3 Fatty Acids (FISH OIL) 1000 MG CAPS   ramipril (ALTACE) 2.5 MG tablet   simvastatin (ZOCOR) 40 MG tablet   traZODone (DESYREL) 50 MG tablet   venlafaxine (EFFEXOR) 75 MG tablet   vitamin E 400 UNIT capsule   zonisamide (ZONEGRAN) 50 MG capsule   No current facility-administered medications for this encounter.    Konrad Felix Ward, PA-C [facility] [phone]

## 2021-07-27 NOTE — H&P (Signed)
KNEE ARTHROPLASTY ADMISSION H&P  Patient ID: Rachel Bates MRN: [id number] DOB/AGE: 77-Aug-1947 77 y.o.  Chief Complaint: left knee pain.  Planned Procedure Date: 08/02/21 Medical Clearance by Ihor Dow, PA Cardiac Clearance by Shirley Muscat, PA  HPI: Rachel Bates is a 76 y.o. female who presents for evaluation of djd left knee. The patient has a history of pain and functional disability in the left knee due to arthritis and has failed non-surgical conservative treatments for greater than 12 weeks to include NSAID's and/or analgesics, corticosteriod injections, and activity modification.  Onset of symptoms was gradual, starting >10 years ago with gradually worsening course since that time. The patient noted no past surgery on the left knee.  Patient currently rates pain at 3 out of 10 with activity. Patient has worsening of pain with activity and weight bearing and pain that interferes with activities of daily living.  Patient has evidence of joint space narrowing by imaging studies.  There is no active infection.  Past Medical History:  Diagnosis Date   AICD (automatic cardioverter/defibrillator) present dual   Medtronic ---  original placedment 2007/  generator change 2014 by dr gregg taylor   Anemia    Anticoagulant long-term use    eliquis   Anxiety    Arthralgia of multiple joints    Arthritis pain    Benign hypertensive heart disease    CAD (coronary artery disease) primary cardiologist-- dr gregg taylor   MI and 2 stents 1995 in [place] [facility])    CHF NYHA class II, chronic, systolic ([place])    followed by dr Carleene Overlie taylor   Degenerative disc disease, lumbar    Depression    Dyslipidemia    History of basal cell carcinoma (BCC) excision    right ankle area s/p  excision in office 06/ 2019  in office   History of DVT of lower extremity yrs ago before 2012   History of MI (myocardial infarction) 1995  in [place]   History of pulmonary embolus (PE) 2012   History of  ventricular tachycardia    Hyperlipidemia    Hypersomnia    Ischemic cardiomyopathy    Migraines    OA (osteoarthritis)    "all over"   Obstructive apnea    PAF (paroxysmal atrial fibrillation) ([place])    Primary localized osteoarthritis of right hip 12/18/2017   Primary localized osteoarthritis of right knee 05/28/2018   S/P coronary artery stent placement 1995   in [facility]   05-21-2018 per pt x2  stents in same coronary artery (unsure BM or DES)   Vitamin D deficiency disease    Past Surgical History:  Procedure Laterality Date   CARDIAC DEFIBRILLATOR PLACEMENT  2007   CATARACT EXTRACTION, BILATERAL     COLONOSCOPY  04/14/2013   colonic polyp, status post polypectomy. Mild panocolonic diverticulosis. Small internal hemorrhoids   DILATION AND CURETTAGE OF UTERUS  yrs ago   IMPLANTABLE CARDIOVERTER DEFIBRILLATOR GENERATOR CHANGE N/A 02/04/2013   Procedure: IMPLANTABLE CARDIOVERTER DEFIBRILLATOR GENERATOR CHANGE;  Surgeon: Evans Lance, MD;  [facility] CATH LAB;  Service: Cardiovascular;  Laterality: N/A;   SHOULDER ARTHROSCOPY Right 2015   TOTAL HIP ARTHROPLASTY Right 12/18/2017   Procedure: RIGHT TOTAL HIP ARTHROPLASTY;  Surgeon: Marchia Bond, MD;  Location: [place];  Service: Orthopedics;  Laterality: Right;   TOTAL HIP ARTHROPLASTY Left 04/19/2021   Procedure: TOTAL HIP ARTHROPLASTY;  Surgeon: Marchia Bond, MD;  [facility];  Service: Orthopedics;  Laterality: Left;  TOTAL KNEE ARTHROPLASTY Right 05/28/2018   Procedure: TOTAL KNEE ARTHROPLASTY;  Surgeon: Marchia Bond, MD;  [facility];  Service: Orthopedics;  Laterality: Right;  Adductor Block   TUBAL LIGATION Bilateral yrs ago   WISDOM TOOTH EXTRACTION     Allergies  Allergen Reactions   Sumatriptan Succinate Other (See Comments)    Chest pain, no triptans, pt states "it makes my heart race"   Amitriptyline Other (See Comments)    Weight gain   Prior to Admission medications   Medication Sig Start Date End Date  Taking? Authorizing Provider  apixaban (ELIQUIS) 5 MG TABS tablet Take 1 tablet (5 mg total) by mouth 2 (two) times daily. Obtaining lab work from PCP office.  May continue to fill. 07/29/20  Yes Evans Lance, MD  b complex vitamins capsule Take 1 capsule by mouth daily.   Yes [provider]  Calcium Carb-Cholecalciferol (CALCIUM 600 + D PO) Take 1 tablet by mouth daily.   Yes [provider]  carvedilol (COREG) 12.5 MG tablet Take 12.5 mg by mouth 2 (two) times daily with a meal.    Yes [provider]  Cholecalciferol (DIALYVITE VITAMIN D 5000) 125 MCG (5000 UT) capsule Take 5,000 Units by mouth daily.   Yes [provider]  Cyanocobalamin (B-12 PO) Take 1 tablet by mouth daily.   Yes [provider]  diphenhydrAMINE (BENADRYL) 25 mg capsule Take 25 mg by mouth daily.   Yes [provider]  FIBER PO Take 1 capsule by mouth at bedtime.   Yes [provider]  Fiber POWD Take 1 Scoop by mouth daily.   Yes [provider]  furosemide (LASIX) 40 MG tablet Take 40 mg by mouth daily as needed for edema.   Yes [provider]  gabapentin (NEURONTIN) 300 MG capsule Take 300 mg by mouth 3 (three) times daily. 04/09/20  Yes [provider]  Neomycin-Bacitracin-Polymyxin (TRIPLE ANTIBIOTIC) OINT Apply 1 application topically daily as needed (wound care).   Yes [provider]  Omega-3 Fatty Acids (FISH OIL) 1000 MG CAPS Take 1,000 mg by mouth daily.    Yes [provider]  ramipril (ALTACE) 2.5 MG tablet Take 2.5 mg by mouth daily.     Yes [provider]  simvastatin (ZOCOR) 40 MG tablet Take 40 mg by mouth at bedtime.     Yes [provider]  traZODone (DESYREL) 50 MG tablet Take 50 mg by mouth at bedtime.    Yes [provider]  venlafaxine (EFFEXOR) 75 MG tablet Take 75 mg by mouth daily. 03/06/21  Yes [provider]  vitamin E 400 UNIT capsule Take 400 Units  by mouth daily.   Yes [provider]  zonisamide (ZONEGRAN) 50 MG capsule Take 50-100 mg by mouth See admin instructions. Take 50mg  by mouth in the morning and 100mg  at night. 09/13/17  Yes [provider]   Social History   Socioeconomic History   Marital status: Single    Spouse name: Not on file   Number of children: Not on file   Years of education: Not on file   Highest education level: Not on file  Occupational History   Not on file  Tobacco Use   Smoking status: Former    Years: 30.00    Types: Cigarettes    Quit date: 05/21/1996    Years since quitting: 25.2   Smokeless tobacco: Never  Vaping Use   Vaping Use: Never used  Substance  and Sexual Activity   Alcohol use: No   Drug use: Never   Sexual activity: Not on file  Other Topics Concern   Not on file  Social History Narrative   Not on file   Social Determinants of Health   Financial Resource Strain: Not on file  Food Insecurity: Not on file  Transportation Needs: Not on file  Physical Activity: Not on file  Stress: Not on file  Social Connections: Not on file   Family History  Problem Relation Age of Onset   Hypertension Mother    Thyroid disease Mother    Alzheimer's disease Mother    Coronary artery disease Father    Pulmonary embolism Father    Congestive Heart Failure Maternal Grandmother    Hypertension Maternal Grandmother    Heart attack Maternal Grandfather    Other Maternal Grandfather        carotid disease   Dementia Paternal Grandmother    Other Paternal Grandfather 43       accident    ROS: Currently denies lightheadedness, dizziness, Fever, chills, CP, SOB.   No personal history of CVA. Does have history of DVT, PE, MI, or CVA. She has full dentures which she will remove preop. All other systems have been reviewed and were otherwise currently negative with the exception of those mentioned in the HPI and as above.  Objective: Vitals: Ht: 5'7" Wt: 175.6 lbs Temp:  98.3 BP: 154/83 Pulse: 79 O2 98% on room air.   Physical Exam: General: Alert, NAD.  Antalgic Gait  HEENT: EOMI, Good Neck Extension  Pulm: No increased work of breathing.  Clear B/L A/P w/o crackle or wheeze.  CV: RRR, No m/g/r appreciated  GI: soft, NT, ND Neuro: Neuro without gross focal deficit.  Sensation intact distally Skin: No lesions in the area of chief complaint MSK/Surgical Site: left knee w/o redness or effusion. + lateral JLT. ROM 0-120.  5/5 strength in extension and flexion.  +EHL/FHL.  NVI.  Stable varus and valgus stress.    Imaging Review Plain radiographs demonstrate severe degenerative joint disease of the left knee.   The overall alignment ismild valgus. The bone quality appears to be adequate for age and reported activity level.  Preoperative templating of the joint replacement has been completed, documented, and submitted to the Operating Room personnel in order to optimize intra-operative equipment management.  Assessment: djd left knee Principal Problem:   Osteoarthritis of left knee   Plan: Plan for Procedure(s): TOTAL KNEE ARTHROPLASTY  The patient history, physical exam, clinical judgement of the provider and imaging are consistent with end stage degenerative joint disease and total joint arthroplasty is deemed medically necessary. The treatment options including medical management, injection therapy, and arthroplasty were discussed at length. The risks and benefits of Procedure(s): TOTAL KNEE ARTHROPLASTY were presented and reviewed.  The risks of nonoperative treatment, versus surgical intervention including but not limited to continued pain, aseptic loosening, stiffness, dislocation/subluxation, infection, bleeding, nerve injury, blood clots, cardiopulmonary complications, morbidity, mortality, among others were discussed. The patient verbalizes understanding and wishes to proceed with the plan.  Patient is being admitted for inpatient treatment for  surgery, pain control, PT, prophylactic antibiotics, VTE prophylaxis, progressive ambulation, ADL's and discharge planning.   Dental prophylaxis discussed and recommended for 2 years postoperatively.  The patient does not meet the criteria for TXA which will be used perioperatively.   Baseline Eliquis will be used postoperatively for DVT prophylaxis in addition to SCDs, and early ambulation. The patient  is planning to be discharged home with  HHPT in care of daughter     Jola Baptist 07/27/2021 1:21 PM

## 2021-08-02 DIAGNOSIS — M1712 Unilateral primary osteoarthritis, left knee: Secondary | ICD-10-CM | POA: Diagnosis present

## 2021-08-03 NOTE — Care Plan (Signed)
Ortho Bundle Case Management Note  Patient Details  Name: Rachel Bates MRN: [id number] Date of Birth: [birth year]  Surgery moved to 08/12/21. Discharge plan remains unchanged. Dates adjusted           DME Arranged:    DME Agency:     HH Arranged:  PT HH Agency:  [facility]  Additional Comments: Please contact me with any questions of if this plan should need to change.  Ladell Heads,  [place] Specialist  [phone] 08/03/2021, 2:42 PM

## 2021-08-08 NOTE — Progress Notes (Addendum)
Spoke to patient and gave her updated arrival time of 1145 for surgery on 08-12-21.  Patient reminded to be NPO after midnight and that she can have clear liquids from midnight until 1130.  Covid test scheduled for 08-10-21 at 8:30.  Patient stated that she will take last dose of Eliquis on 08-08-21.  No change in medical history, patient rescheduled due to sick surgeon.  All questions answered.

## 2021-08-10 ENCOUNTER — Encounter (HOSPITAL_COMMUNITY)
Admission: RE | Admit: 2021-08-10 | Discharge: 2021-08-10 | Disposition: A | Discharge status: Home / Self Care | Payer: Medicare Other | Source: Ambulatory Visit | Attending: Orthopedic Surgery | Admitting: Orthopedic Surgery

## 2021-08-10 ENCOUNTER — Other Ambulatory Visit: Payer: Self-pay

## 2021-08-10 DIAGNOSIS — I509 Heart failure, unspecified: Secondary | ICD-10-CM | POA: Diagnosis not present

## 2021-08-10 DIAGNOSIS — Z955 Presence of coronary angioplasty implant and graft: Secondary | ICD-10-CM | POA: Diagnosis not present

## 2021-08-10 DIAGNOSIS — I252 Old myocardial infarction: Secondary | ICD-10-CM | POA: Diagnosis not present

## 2021-08-10 DIAGNOSIS — F32A Depression, unspecified: Secondary | ICD-10-CM | POA: Diagnosis not present

## 2021-08-10 DIAGNOSIS — Z79899 Other long term (current) drug therapy: Secondary | ICD-10-CM | POA: Diagnosis not present

## 2021-08-10 DIAGNOSIS — Z87891 Personal history of nicotine dependence: Secondary | ICD-10-CM | POA: Diagnosis not present

## 2021-08-10 DIAGNOSIS — I4891 Unspecified atrial fibrillation: Secondary | ICD-10-CM | POA: Diagnosis not present

## 2021-08-10 DIAGNOSIS — Z01812 Encounter for preprocedural laboratory examination: Secondary | ICD-10-CM

## 2021-08-10 DIAGNOSIS — I2510 Atherosclerotic heart disease of native coronary artery without angina pectoris: Secondary | ICD-10-CM | POA: Diagnosis not present

## 2021-08-10 DIAGNOSIS — I110 Hypertensive heart disease with heart failure: Secondary | ICD-10-CM | POA: Diagnosis not present

## 2021-08-10 DIAGNOSIS — Z95810 Presence of automatic (implantable) cardiac defibrillator: Secondary | ICD-10-CM | POA: Diagnosis not present

## 2021-08-10 DIAGNOSIS — Z20822 Contact with and (suspected) exposure to covid-19: Secondary | ICD-10-CM | POA: Diagnosis not present

## 2021-08-10 DIAGNOSIS — M1712 Unilateral primary osteoarthritis, left knee: Secondary | ICD-10-CM | POA: Diagnosis not present

## 2021-08-10 DIAGNOSIS — F419 Anxiety disorder, unspecified: Secondary | ICD-10-CM | POA: Diagnosis not present

## 2021-08-10 DIAGNOSIS — Z96652 Presence of left artificial knee joint: Secondary | ICD-10-CM | POA: Diagnosis present

## 2021-08-10 DIAGNOSIS — Z7901 Long term (current) use of anticoagulants: Secondary | ICD-10-CM | POA: Diagnosis not present

## 2021-08-10 LAB — SARS CORONAVIRUS 2 (TAT 6-24 HRS): SARS Coronavirus 2: NEGATIVE

## 2021-08-11 DIAGNOSIS — R739 Hyperglycemia, unspecified: Secondary | ICD-10-CM | POA: Diagnosis not present

## 2021-08-11 DIAGNOSIS — E782 Mixed hyperlipidemia: Secondary | ICD-10-CM | POA: Diagnosis not present

## 2021-08-11 DIAGNOSIS — Z79899 Other long term (current) drug therapy: Secondary | ICD-10-CM | POA: Diagnosis not present

## 2021-08-11 NOTE — Anesthesia Preprocedure Evaluation (Addendum)
Anesthesia Evaluation  Patient identified by MRN, date of birth, ID band Patient awake    Reviewed: Allergy & Precautions, NPO status , Patient's Chart, lab work & pertinent test results, reviewed documented beta blocker date and time   Airway Mallampati: III  TM Distance: >3 FB Neck ROM: Full    Dental  (+) Edentulous Upper, Edentulous Lower, Dental Advisory Given   Pulmonary sleep apnea (stopped using years ago) , former smoker,    Pulmonary exam normal breath sounds clear to auscultation       Cardiovascular hypertension (146/69 in preop, normally 120/70 per pt), Pt. on medications and Pt. on home beta blockers + CAD, + Past MI, + Cardiac Stents (x 2 in 1995) and +CHF  Normal cardiovascular exam+ dysrhythmias (eliquis last dose: 08/08/21) Atrial Fibrillation and Ventricular Tachycardia + Cardiac Defibrillator  Rhythm:Regular Rate:Normal     Neuro/Psych  Headaches, PSYCHIATRIC DISORDERS Anxiety Depression    GI/Hepatic negative GI ROS, Neg liver ROS,   Endo/Other  negative endocrine ROS  Renal/GU negative Renal ROS  negative genitourinary   Musculoskeletal  (+) Arthritis , Osteoarthritis,    Abdominal   Peds  Hematology hct 43, plt 125   Anesthesia Other Findings   Reproductive/Obstetrics negative OB ROS                            Anesthesia Physical Anesthesia Plan  ASA: 3  Anesthesia Plan: General   Post-op Pain Management: Regional block and Tylenol PO (pre-op)   Induction:   PONV Risk Score and Plan: Ondansetron, Dexamethasone and Treatment may vary due to age or medical condition  Airway Management Planned: Oral ETT  Additional Equipment: None  Intra-op Plan:   Post-operative Plan:   Informed Consent: I have reviewed the patients History and Physical, chart, labs and discussed the procedure including the risks, benefits and alternatives for the proposed anesthesia with the  patient or authorized representative who has indicated his/her understanding and acceptance.     Dental advisory given  Plan Discussed with: CRNA  Anesthesia Plan Comments: (?  GA for all other joint replacements, pt is not sure why and the documentation doesn't explain why. Wishes to do the same as she's had in the past. I explained the benefits of spinal/MAC to pt and she understands)     Anesthesia Quick Evaluation

## 2021-08-12 ENCOUNTER — Ambulatory Visit (HOSPITAL_COMMUNITY): Payer: Medicare Other | Admitting: Anesthesiology

## 2021-08-12 ENCOUNTER — Ambulatory Visit (HOSPITAL_COMMUNITY): Payer: Medicare Other | Admitting: Physician Assistant

## 2021-08-12 ENCOUNTER — Encounter (HOSPITAL_COMMUNITY): Payer: Self-pay | Admitting: Orthopedic Surgery

## 2021-08-12 ENCOUNTER — Other Ambulatory Visit: Payer: Self-pay

## 2021-08-12 ENCOUNTER — Ambulatory Visit (HOSPITAL_COMMUNITY): Payer: Medicare Other

## 2021-08-12 ENCOUNTER — Encounter (HOSPITAL_COMMUNITY)
Admission: RE | Disposition: A | Discharge status: Home / Self Care | Payer: Self-pay | Source: Home / Self Care | Attending: Orthopedic Surgery

## 2021-08-12 ENCOUNTER — Ambulatory Visit (HOSPITAL_COMMUNITY)
Admission: RE | Admit: 2021-08-12 | Discharge: 2021-08-13 | Disposition: A | Discharge status: Home / Self Care | Payer: Medicare Other | Attending: Orthopedic Surgery | Admitting: Orthopedic Surgery

## 2021-08-12 DIAGNOSIS — G8918 Other acute postprocedural pain: Secondary | ICD-10-CM | POA: Diagnosis not present

## 2021-08-12 DIAGNOSIS — Z96652 Presence of left artificial knee joint: Secondary | ICD-10-CM | POA: Diagnosis not present

## 2021-08-12 DIAGNOSIS — I252 Old myocardial infarction: Secondary | ICD-10-CM | POA: Diagnosis not present

## 2021-08-12 DIAGNOSIS — M1712 Unilateral primary osteoarthritis, left knee: Secondary | ICD-10-CM | POA: Insufficient documentation

## 2021-08-12 DIAGNOSIS — I110 Hypertensive heart disease with heart failure: Secondary | ICD-10-CM | POA: Diagnosis not present

## 2021-08-12 DIAGNOSIS — F419 Anxiety disorder, unspecified: Secondary | ICD-10-CM | POA: Insufficient documentation

## 2021-08-12 DIAGNOSIS — I509 Heart failure, unspecified: Secondary | ICD-10-CM | POA: Diagnosis not present

## 2021-08-12 DIAGNOSIS — Z955 Presence of coronary angioplasty implant and graft: Secondary | ICD-10-CM | POA: Diagnosis not present

## 2021-08-12 DIAGNOSIS — Z87891 Personal history of nicotine dependence: Secondary | ICD-10-CM | POA: Diagnosis not present

## 2021-08-12 DIAGNOSIS — I4891 Unspecified atrial fibrillation: Secondary | ICD-10-CM | POA: Insufficient documentation

## 2021-08-12 DIAGNOSIS — I2510 Atherosclerotic heart disease of native coronary artery without angina pectoris: Secondary | ICD-10-CM | POA: Diagnosis not present

## 2021-08-12 DIAGNOSIS — Z7901 Long term (current) use of anticoagulants: Secondary | ICD-10-CM | POA: Insufficient documentation

## 2021-08-12 DIAGNOSIS — Z01812 Encounter for preprocedural laboratory examination: Secondary | ICD-10-CM

## 2021-08-12 DIAGNOSIS — F32A Depression, unspecified: Secondary | ICD-10-CM | POA: Insufficient documentation

## 2021-08-12 DIAGNOSIS — Z20822 Contact with and (suspected) exposure to covid-19: Secondary | ICD-10-CM | POA: Insufficient documentation

## 2021-08-12 DIAGNOSIS — Z79899 Other long term (current) drug therapy: Secondary | ICD-10-CM | POA: Insufficient documentation

## 2021-08-12 DIAGNOSIS — Z95810 Presence of automatic (implantable) cardiac defibrillator: Secondary | ICD-10-CM | POA: Insufficient documentation

## 2021-08-12 HISTORY — PX: TOTAL KNEE ARTHROPLASTY: SHX125

## 2021-08-12 SURGERY — ARTHROPLASTY, KNEE, TOTAL
Anesthesia: General | Site: Knee | Laterality: Left

## 2021-08-12 MED ORDER — DEXAMETHASONE SODIUM PHOSPHATE 10 MG/ML IJ SOLN
10.0000 mg | Freq: Once | INTRAMUSCULAR | Status: AC
  Administered 2021-08-13: 10 mg via INTRAVENOUS
  Filled 2021-08-12: qty 1

## 2021-08-12 MED ORDER — GABAPENTIN 300 MG PO CAPS
300.0000 mg | ORAL_CAPSULE | Freq: Three times a day (TID) | ORAL | Status: DC
  Administered 2021-08-12 – 2021-08-13 (×2): 300 mg via ORAL
  Filled 2021-08-12 (×2): qty 1

## 2021-08-12 MED ORDER — POTASSIUM CHLORIDE IN NACL 20-0.45 MEQ/L-% IV SOLN
INTRAVENOUS | Status: DC
  Filled 2021-08-12 (×2): qty 1000

## 2021-08-12 MED ORDER — POVIDONE-IODINE 10 % EX SWAB
2.0000 "application " | Freq: Once | CUTANEOUS | Status: AC
  Administered 2021-08-12: 2 via TOPICAL

## 2021-08-12 MED ORDER — ACETAMINOPHEN 500 MG PO TABS
1000.0000 mg | ORAL_TABLET | Freq: Once | ORAL | Status: AC
  Administered 2021-08-12: 1000 mg via ORAL

## 2021-08-12 MED ORDER — FENTANYL CITRATE (PF) 100 MCG/2ML IJ SOLN
INTRAMUSCULAR | Status: AC
  Filled 2021-08-12: qty 2

## 2021-08-12 MED ORDER — DEXAMETHASONE SODIUM PHOSPHATE 10 MG/ML IJ SOLN
INTRAMUSCULAR | Status: AC
  Filled 2021-08-12: qty 1

## 2021-08-12 MED ORDER — BISACODYL 10 MG RE SUPP: 10.0000 mg | Freq: Every day | RECTAL | Status: DC | PRN

## 2021-08-12 MED ORDER — SODIUM CHLORIDE 0.9 % IR SOLN
Status: DC | PRN
  Administered 2021-08-12 (×2): 1000 mL

## 2021-08-12 MED ORDER — MIDAZOLAM HCL 2 MG/2ML IJ SOLN
1.0000 mg | INTRAMUSCULAR | Status: DC
  Filled 2021-08-12: qty 2

## 2021-08-12 MED ORDER — ONDANSETRON HCL 4 MG/2ML IJ SOLN: 4.0000 mg | Freq: Once | INTRAMUSCULAR | Status: DC | PRN

## 2021-08-12 MED ORDER — VENLAFAXINE HCL 75 MG PO TABS
75.0000 mg | ORAL_TABLET | Freq: Every day | ORAL | Status: DC
  Administered 2021-08-13: 75 mg via ORAL
  Filled 2021-08-12: qty 1

## 2021-08-12 MED ORDER — CEFAZOLIN SODIUM-DEXTROSE 1-4 GM/50ML-% IV SOLN
1.0000 g | Freq: Four times a day (QID) | INTRAVENOUS | Status: AC
  Administered 2021-08-12 – 2021-08-13 (×2): 1 g via INTRAVENOUS
  Filled 2021-08-12: qty 50

## 2021-08-12 MED ORDER — OXYCODONE HCL 5 MG PO TABS
5.0000 mg | ORAL_TABLET | ORAL | Status: DC | PRN
  Administered 2021-08-12: 10 mg via ORAL
  Administered 2021-08-13 (×2): 5 mg via ORAL
  Filled 2021-08-12: qty 1
  Filled 2021-08-12: qty 2
  Filled 2021-08-12: qty 1

## 2021-08-12 MED ORDER — ACETAMINOPHEN 325 MG PO TABS: 325.0000 mg | ORAL_TABLET | Freq: Four times a day (QID) | ORAL | Status: DC | PRN

## 2021-08-12 MED ORDER — ZONISAMIDE 100 MG PO CAPS
100.0000 mg | ORAL_CAPSULE | Freq: Every day | ORAL | Status: DC
  Administered 2021-08-12: 100 mg via ORAL
  Filled 2021-08-12: qty 1

## 2021-08-12 MED ORDER — TRAZODONE HCL 50 MG PO TABS
50.0000 mg | ORAL_TABLET | Freq: Every day | ORAL | Status: DC
  Administered 2021-08-12: 50 mg via ORAL
  Filled 2021-08-12: qty 1

## 2021-08-12 MED ORDER — PROPOFOL 10 MG/ML IV BOLUS
INTRAVENOUS | Status: DC | PRN
  Administered 2021-08-12: 30 mg via INTRAVENOUS
  Administered 2021-08-12: 100 mg via INTRAVENOUS

## 2021-08-12 MED ORDER — WATER FOR IRRIGATION, STERILE IR SOLN
Status: DC | PRN
  Administered 2021-08-12: 1000 mL

## 2021-08-12 MED ORDER — OXYCODONE HCL 5 MG PO TABS: 5.0000 mg | ORAL_TABLET | Freq: Once | ORAL | Status: DC | PRN

## 2021-08-12 MED ORDER — CARVEDILOL 12.5 MG PO TABS
12.5000 mg | ORAL_TABLET | Freq: Two times a day (BID) | ORAL | Status: DC
  Administered 2021-08-13: 12.5 mg via ORAL
  Filled 2021-08-12: qty 1

## 2021-08-12 MED ORDER — BUPIVACAINE HCL (PF) 0.5 % IJ SOLN
INTRAMUSCULAR | Status: DC | PRN
Start: 2021-08-12 — End: 2021-08-12
  Administered 2021-08-12: 30 mL via PERINEURAL

## 2021-08-12 MED ORDER — LIDOCAINE 2% (20 MG/ML) 5 ML SYRINGE
INTRAMUSCULAR | Status: DC | PRN
  Administered 2021-08-12: 60 mg via INTRAVENOUS

## 2021-08-12 MED ORDER — TRANEXAMIC ACID-NACL 1000-0.7 MG/100ML-% IV SOLN
INTRAVENOUS | Status: AC
  Filled 2021-08-12: qty 100

## 2021-08-12 MED ORDER — KETOROLAC TROMETHAMINE 30 MG/ML IJ SOLN
INTRAMUSCULAR | Status: AC
  Filled 2021-08-12: qty 1

## 2021-08-12 MED ORDER — MENTHOL 3 MG MT LOZG: 1.0000 | LOZENGE | OROMUCOSAL | Status: DC | PRN

## 2021-08-12 MED ORDER — APIXABAN 5 MG PO TABS
5.0000 mg | ORAL_TABLET | Freq: Two times a day (BID) | ORAL | Status: DC
  Administered 2021-08-13: 5 mg via ORAL
  Filled 2021-08-12: qty 1

## 2021-08-12 MED ORDER — FENTANYL CITRATE PF 50 MCG/ML IJ SOSY
PREFILLED_SYRINGE | INTRAMUSCULAR | Status: AC
  Filled 2021-08-12: qty 3

## 2021-08-12 MED ORDER — SIMVASTATIN 40 MG PO TABS
40.0000 mg | ORAL_TABLET | Freq: Every day | ORAL | Status: DC
  Administered 2021-08-12: 40 mg via ORAL
  Filled 2021-08-12: qty 1

## 2021-08-12 MED ORDER — METHOCARBAMOL 500 MG IVPB - SIMPLE MED
INTRAVENOUS | Status: AC
  Filled 2021-08-12: qty 50

## 2021-08-12 MED ORDER — ALUM & MAG HYDROXIDE-SIMETH 200-200-20 MG/5ML PO SUSP: 30.0000 mL | ORAL | Status: DC | PRN

## 2021-08-12 MED ORDER — ONDANSETRON HCL 4 MG/2ML IJ SOLN: 4.0000 mg | Freq: Four times a day (QID) | INTRAMUSCULAR | Status: DC | PRN

## 2021-08-12 MED ORDER — FENTANYL CITRATE (PF) 250 MCG/5ML IJ SOLN
INTRAMUSCULAR | Status: AC
  Filled 2021-08-12: qty 5

## 2021-08-12 MED ORDER — LACTATED RINGERS IV SOLN: INTRAVENOUS | Status: DC

## 2021-08-12 MED ORDER — HYDROCODONE-ACETAMINOPHEN 10-325 MG PO TABS: 1.0000 | ORAL_TABLET | Freq: Four times a day (QID) | ORAL | 0 refills | Status: DC | PRN

## 2021-08-12 MED ORDER — POLYETHYLENE GLYCOL 3350 17 G PO PACK: 17.0000 g | PACK | Freq: Every day | ORAL | Status: DC | PRN

## 2021-08-12 MED ORDER — CEFAZOLIN SODIUM-DEXTROSE 2-4 GM/100ML-% IV SOLN
2.0000 g | INTRAVENOUS | Status: AC
  Administered 2021-08-12: 2 g via INTRAVENOUS
  Filled 2021-08-12: qty 100

## 2021-08-12 MED ORDER — SUGAMMADEX SODIUM 200 MG/2ML IV SOLN
INTRAVENOUS | Status: DC | PRN
  Administered 2021-08-12: 170 mg via INTRAVENOUS

## 2021-08-12 MED ORDER — DOCUSATE SODIUM 100 MG PO CAPS
100.0000 mg | ORAL_CAPSULE | Freq: Two times a day (BID) | ORAL | Status: DC
  Administered 2021-08-12 – 2021-08-13 (×2): 100 mg via ORAL
  Filled 2021-08-12 (×2): qty 1

## 2021-08-12 MED ORDER — BUPIVACAINE HCL (PF) 0.25 % IJ SOLN
INTRAMUSCULAR | Status: AC
  Filled 2021-08-12: qty 30

## 2021-08-12 MED ORDER — OXYCODONE HCL 5 MG PO TABS: 10.0000 mg | ORAL_TABLET | ORAL | Status: DC | PRN

## 2021-08-12 MED ORDER — TRANEXAMIC ACID-NACL 1000-0.7 MG/100ML-% IV SOLN
1000.0000 mg | INTRAVENOUS | Status: AC
  Administered 2021-08-12: 1000 mg via INTRAVENOUS

## 2021-08-12 MED ORDER — PHENYLEPHRINE HCL-NACL 20-0.9 MG/250ML-% IV SOLN
INTRAVENOUS | Status: DC | PRN
  Administered 2021-08-12: 40 ug/min via INTRAVENOUS

## 2021-08-12 MED ORDER — FENTANYL CITRATE (PF) 100 MCG/2ML IJ SOLN
INTRAMUSCULAR | Status: DC | PRN
  Administered 2021-08-12: 50 ug via INTRAVENOUS
  Administered 2021-08-12: 25 ug via INTRAVENOUS
  Administered 2021-08-12 (×3): 50 ug via INTRAVENOUS
  Administered 2021-08-12: 25 ug via INTRAVENOUS
  Administered 2021-08-12: 50 ug via INTRAVENOUS

## 2021-08-12 MED ORDER — METHOCARBAMOL 500 MG PO TABS
500.0000 mg | ORAL_TABLET | Freq: Four times a day (QID) | ORAL | Status: DC | PRN
  Filled 2021-08-12: qty 1

## 2021-08-12 MED ORDER — CARVEDILOL 12.5 MG PO TABS
12.5000 mg | ORAL_TABLET | ORAL | Status: AC
  Administered 2021-08-12: 12.5 mg via ORAL
  Filled 2021-08-12: qty 1

## 2021-08-12 MED ORDER — ACETAMINOPHEN 500 MG PO TABS
1000.0000 mg | ORAL_TABLET | Freq: Once | ORAL | Status: AC
  Filled 2021-08-12: qty 2

## 2021-08-12 MED ORDER — METOCLOPRAMIDE HCL 5 MG/ML IJ SOLN: 5.0000 mg | Freq: Three times a day (TID) | INTRAMUSCULAR | Status: DC | PRN

## 2021-08-12 MED ORDER — PHENYLEPHRINE 40 MCG/ML (10ML) SYRINGE FOR IV PUSH (FOR BLOOD PRESSURE SUPPORT)
PREFILLED_SYRINGE | INTRAVENOUS | Status: DC | PRN
  Administered 2021-08-12: 80 ug via INTRAVENOUS

## 2021-08-12 MED ORDER — FLEET ENEMA 7-19 GM/118ML RE ENEM: 1.0000 | ENEMA | Freq: Once | RECTAL | Status: DC | PRN

## 2021-08-12 MED ORDER — METOCLOPRAMIDE HCL 5 MG PO TABS: 5.0000 mg | ORAL_TABLET | Freq: Three times a day (TID) | ORAL | Status: DC | PRN

## 2021-08-12 MED ORDER — FENTANYL CITRATE (PF) 250 MCG/5ML IJ SOLN: INTRAMUSCULAR | Status: DC | PRN

## 2021-08-12 MED ORDER — ONDANSETRON HCL 4 MG PO TABS: 4.0000 mg | ORAL_TABLET | Freq: Four times a day (QID) | ORAL | Status: DC | PRN

## 2021-08-12 MED ORDER — ORAL CARE MOUTH RINSE: 15.0000 mL | Freq: Once | OROMUCOSAL | Status: AC

## 2021-08-12 MED ORDER — HYDROMORPHONE HCL 1 MG/ML IJ SOLN: 0.5000 mg | INTRAMUSCULAR | Status: DC | PRN

## 2021-08-12 MED ORDER — FENTANYL CITRATE PF 50 MCG/ML IJ SOSY
25.0000 ug | PREFILLED_SYRINGE | INTRAMUSCULAR | Status: DC | PRN
  Administered 2021-08-12 (×2): 50 ug via INTRAVENOUS

## 2021-08-12 MED ORDER — FENTANYL CITRATE PF 50 MCG/ML IJ SOSY
50.0000 ug | PREFILLED_SYRINGE | INTRAMUSCULAR | Status: DC
  Administered 2021-08-12: 50 ug via INTRAVENOUS
  Filled 2021-08-12: qty 2

## 2021-08-12 MED ORDER — RAMIPRIL 2.5 MG PO CAPS
2.5000 mg | ORAL_CAPSULE | Freq: Every day | ORAL | Status: DC
  Administered 2021-08-13: 2.5 mg via ORAL
  Filled 2021-08-12: qty 1

## 2021-08-12 MED ORDER — LIDOCAINE HCL (PF) 2 % IJ SOLN
INTRAMUSCULAR | Status: AC
  Filled 2021-08-12: qty 5

## 2021-08-12 MED ORDER — ONDANSETRON HCL 4 MG/2ML IJ SOLN
INTRAMUSCULAR | Status: AC
  Filled 2021-08-12: qty 2

## 2021-08-12 MED ORDER — ZONISAMIDE 25 MG PO CAPS
50.0000 mg | ORAL_CAPSULE | Freq: Every day | ORAL | Status: DC
  Administered 2021-08-13: 50 mg via ORAL
  Filled 2021-08-12: qty 2

## 2021-08-12 MED ORDER — DIPHENHYDRAMINE HCL 25 MG PO CAPS
25.0000 mg | ORAL_CAPSULE | Freq: Every day | ORAL | Status: DC
  Administered 2021-08-13: 25 mg via ORAL
  Filled 2021-08-12: qty 1

## 2021-08-12 MED ORDER — ACETAMINOPHEN 500 MG PO TABS
1000.0000 mg | ORAL_TABLET | Freq: Four times a day (QID) | ORAL | Status: DC
  Administered 2021-08-12: 1000 mg via ORAL
  Filled 2021-08-12: qty 2

## 2021-08-12 MED ORDER — ZONISAMIDE 50 MG PO CAPS: 50.0000 mg | ORAL_CAPSULE | ORAL | Status: DC

## 2021-08-12 MED ORDER — KETOROLAC TROMETHAMINE 30 MG/ML IJ SOLN
INTRAMUSCULAR | Status: DC | PRN
  Administered 2021-08-12: 30 mg

## 2021-08-12 MED ORDER — DEXAMETHASONE SODIUM PHOSPHATE 10 MG/ML IJ SOLN
INTRAMUSCULAR | Status: DC | PRN
  Administered 2021-08-12: 10 mg via INTRAVENOUS

## 2021-08-12 MED ORDER — METHOCARBAMOL 500 MG IVPB - SIMPLE MED
500.0000 mg | Freq: Four times a day (QID) | INTRAVENOUS | Status: DC | PRN
  Administered 2021-08-12: 500 mg via INTRAVENOUS
  Filled 2021-08-12: qty 50

## 2021-08-12 MED ORDER — ONDANSETRON HCL 4 MG/2ML IJ SOLN
INTRAMUSCULAR | Status: DC | PRN
  Administered 2021-08-12: 4 mg via INTRAVENOUS

## 2021-08-12 MED ORDER — PROPOFOL 10 MG/ML IV BOLUS
INTRAVENOUS | Status: AC
  Filled 2021-08-12: qty 20

## 2021-08-12 MED ORDER — DEXAMETHASONE SODIUM PHOSPHATE 10 MG/ML IJ SOLN
INTRAMUSCULAR | Status: DC | PRN
  Administered 2021-08-12: 10 mg

## 2021-08-12 MED ORDER — OXYCODONE HCL 5 MG/5ML PO SOLN: 5.0000 mg | Freq: Once | ORAL | Status: DC | PRN

## 2021-08-12 MED ORDER — CHLORHEXIDINE GLUCONATE 0.12 % MT SOLN
15.0000 mL | Freq: Once | OROMUCOSAL | Status: AC
  Administered 2021-08-12: 15 mL via OROMUCOSAL

## 2021-08-12 MED ORDER — ROCURONIUM BROMIDE 10 MG/ML (PF) SYRINGE
PREFILLED_SYRINGE | INTRAVENOUS | Status: DC | PRN
  Administered 2021-08-12: 100 mg via INTRAVENOUS

## 2021-08-12 MED ORDER — BUPIVACAINE HCL 0.25 % IJ SOLN
INTRAMUSCULAR | Status: DC | PRN
  Administered 2021-08-12: 30 mL

## 2021-08-12 MED ORDER — PHENOL 1.4 % MT LIQD: 1.0000 | OROMUCOSAL | Status: DC | PRN

## 2021-08-12 SURGICAL SUPPLY — 56 items
ATTUNE MED DOME PAT 38 KNEE (Knees) ×1 IMPLANT
ATTUNE PS FEM LT SZ 7 CEM KNEE (Femur) ×1 IMPLANT
BAG COUNTER SPONGE SURGICOUNT (BAG) IMPLANT
BAG SPEC THK2 15X12 ZIP CLS (MISCELLANEOUS)
BAG SPNG CNTER NS LX DISP (BAG)
BAG ZIPLOCK 12X15 (MISCELLANEOUS) IMPLANT
BASEPLATE TIB CMT FB PCKT SZ5 (Knees) ×1 IMPLANT
BLADE SAG 18X100X1.27 (BLADE) ×2 IMPLANT
BLADE SAW SGTL 11.0X1.19X90.0M (BLADE) ×2 IMPLANT
BLADE SAW SGTL 13X75X1.27 (BLADE) ×2 IMPLANT
BLADE SURG 15 STRL LF DISP TIS (BLADE) ×1 IMPLANT
BLADE SURG 15 STRL SS (BLADE) ×2
BNDG CMPR MED 10X6 ELC LF (GAUZE/BANDAGES/DRESSINGS) ×1
BNDG ELASTIC 6X10 VLCR STRL LF (GAUZE/BANDAGES/DRESSINGS) ×2 IMPLANT
BOWL SMART MIX CTS (DISPOSABLE) ×2 IMPLANT
BSPLAT TIB 5 CMNT FXBRNG STRL (Knees) ×1 IMPLANT
CEMENT HV SMART SET (Cement) ×4 IMPLANT
CLSR STERI-STRIP ANTIMIC 1/2X4 (GAUZE/BANDAGES/DRESSINGS) ×3 IMPLANT
COVER SURGICAL LIGHT HANDLE (MISCELLANEOUS) ×2 IMPLANT
CUFF TOURN SGL QUICK 34 (TOURNIQUET CUFF) ×2
CUFF TRNQT CYL 34X4.125X (TOURNIQUET CUFF) ×1 IMPLANT
DRAPE SHEET LG 3/4 BI-LAMINATE (DRAPES) ×2 IMPLANT
DRAPE U-SHAPE 47X51 STRL (DRAPES) ×2 IMPLANT
DRSG MEPILEX BORDER 4X12 (GAUZE/BANDAGES/DRESSINGS) ×2 IMPLANT
DRSG PAD ABDOMINAL 8X10 ST (GAUZE/BANDAGES/DRESSINGS) ×3 IMPLANT
DURAPREP 26ML APPLICATOR (WOUND CARE) ×4 IMPLANT
ELECT REM PT RETURN 15FT ADLT (MISCELLANEOUS) ×2 IMPLANT
GLOVE SRG 8 PF TXTR STRL LF DI (GLOVE) ×1 IMPLANT
GLOVE SURG ENC MOIS LTX SZ6.5 (GLOVE) ×2 IMPLANT
GLOVE SURG ENC MOIS LTX SZ7.5 (GLOVE) ×2 IMPLANT
GLOVE SURG UNDER POLY LF SZ7 (GLOVE) ×2 IMPLANT
GLOVE SURG UNDER POLY LF SZ8 (GLOVE) ×2
GOWN STRL REUS W/ TWL LRG LVL3 (GOWN DISPOSABLE) ×2 IMPLANT
GOWN STRL REUS W/TWL LRG LVL3 (GOWN DISPOSABLE) ×4
HANDPIECE INTERPULSE COAX TIP (DISPOSABLE) ×2
HOLDER FOLEY CATH W/STRAP (MISCELLANEOUS) IMPLANT
HOOD PEEL AWAY FLYTE STAYCOOL (MISCELLANEOUS) ×6 IMPLANT
IMMOBILIZER KNEE 20 (SOFTGOODS) ×2
IMMOBILIZER KNEE 20 THIGH 36 (SOFTGOODS) ×1 IMPLANT
INSERT TIB ATTUNE FB SZ7X6 (Insert) ×1 IMPLANT
KIT TURNOVER KIT A (KITS) IMPLANT
MANIFOLD NEPTUNE II (INSTRUMENTS) ×2 IMPLANT
NS IRRIG 1000ML POUR BTL (IV SOLUTION) ×2 IMPLANT
PACK TOTAL KNEE CUSTOM (KITS) ×2 IMPLANT
PROTECTOR NERVE ULNAR (MISCELLANEOUS) ×2 IMPLANT
SET HNDPC FAN SPRY TIP SCT (DISPOSABLE) ×1 IMPLANT
SET PAD KNEE POSITIONER (MISCELLANEOUS) ×2 IMPLANT
SPONGE T-LAP 18X18 ~~LOC~~+RFID (SPONGE) ×2 IMPLANT
SUT VIC AB 1 CT1 36 (SUTURE) ×4 IMPLANT
SUT VIC AB 2-0 CT1 27 (SUTURE) ×2
SUT VIC AB 2-0 CT1 TAPERPNT 27 (SUTURE) ×1 IMPLANT
SUT VIC AB 3-0 SH 8-18 (SUTURE) ×2 IMPLANT
TRAY FOLEY MTR SLVR 16FR STAT (SET/KITS/TRAYS/PACK) ×2 IMPLANT
TUBE SUCTION HIGH CAP CLEAR NV (SUCTIONS) ×2 IMPLANT
WATER STERILE IRR 1000ML POUR (IV SOLUTION) ×4 IMPLANT
WRAP KNEE MAXI GEL POST OP (GAUZE/BANDAGES/DRESSINGS) ×2 IMPLANT

## 2021-08-12 NOTE — Progress Notes (Signed)
Assisted Dr. Nunzio Cobbs with left Knee Adductor Canal  block. Side rails up, monitors on throughout procedure. See vital signs in flow sheet. Tolerated Procedure well.

## 2021-08-12 NOTE — Transfer of Care (Signed)
Immediate Anesthesia Transfer of Care Note  Patient: Rachel Bates  Procedure(s) Performed: TOTAL KNEE ARTHROPLASTY (Left: Knee)  Patient Location: PACU  Anesthesia Type:GA combined with regional for post-op pain  Level of Consciousness: drowsy and patient cooperative  Airway & Oxygen Therapy: Patient Spontanous Breathing and Patient connected to face mask oxygen  Post-op Assessment: Report given to RN and Post -op Vital signs reviewed and stable  Post vital signs: Reviewed and stable  Last Vitals:  Vitals Value Taken Time  BP 120/69 08/12/21 1718  Temp    Pulse 85 08/12/21 1720  Resp 16 08/12/21 1720  SpO2 96 % 08/12/21 1720  Vitals shown include unvalidated device data.  Last Pain:  Vitals:   08/12/21 1249  TempSrc:   PainSc: 0-No pain         Complications: No notable events documented.

## 2021-08-12 NOTE — Plan of Care (Signed)
  Problem: Education: Goal: Knowledge of General Education information will improve Description: Including pain rating scale, medication(s)/side effects and non-pharmacologic comfort measures Outcome: Progressing   Problem: Activity: Goal: Risk for activity intolerance will decrease Outcome: Progressing   Problem: Pain Managment: Goal: General experience of comfort will improve Outcome: Progressing   

## 2021-08-12 NOTE — Anesthesia Procedure Notes (Signed)
Procedure Name: Intubation Date/Time: 08/12/2021 3:04 PM Performed by: Sharlette Dense, CRNA Pre-anesthesia Checklist: Patient identified, Emergency Drugs available, Suction available and Patient being monitored Patient Re-evaluated:Patient Re-evaluated prior to induction Oxygen Delivery Method: Circle system utilized Preoxygenation: Pre-oxygenation with 100% oxygen Induction Type: IV induction Ventilation: Mask ventilation without difficulty and Oral airway inserted - appropriate to patient size Laryngoscope Size: Sabra Heck and 2 Grade View: Grade I Tube type: Oral Tube size: 7.0 mm Number of attempts: 1 Airway Equipment and Method: Stylet and Oral airway Placement Confirmation: ETT inserted through vocal cords under direct vision, positive ETCO2 and breath sounds checked- equal and bilateral Secured at: 20 cm Tube secured with: Tape Dental Injury: Teeth and Oropharynx as per pre-operative assessment

## 2021-08-12 NOTE — Interval H&P Note (Signed)
History and Physical Interval Note:  08/12/2021 2:46 PM  Rachel Bates  has presented today for surgery, with the diagnosis of djd left knee.  The various methods of treatment have been discussed with the patient and family. After consideration of risks, benefits and other options for treatment, the patient has consented to  Procedure(s): TOTAL KNEE ARTHROPLASTY (Left) as a surgical intervention.  The patient's history has been reviewed, patient examined, no change in status, stable for surgery.  I have reviewed the patient's chart and labs.  Questions were answered to the patient's satisfaction.     Johnny Bridge

## 2021-08-12 NOTE — Anesthesia Procedure Notes (Signed)
Anesthesia Regional Block: Adductor canal block   Pre-Anesthetic Checklist: , timeout performed,  Correct Patient, Correct Site, Correct Laterality,  Correct Procedure, Correct Position, site marked,  Risks and benefits discussed,  Surgical consent,  Pre-op evaluation,  At surgeon's request and post-op pain management  Laterality: Left  Prep: Maximum Sterile Barrier Precautions used, chloraprep       Needles:  Injection technique: Single-shot  Needle Type: Echogenic Stimulator Needle     Needle Length: 9cm  Needle Gauge: 22     Additional Needles:   Procedures:,,,, ultrasound used (permanent image in chart),,    Narrative:  Start time: 08/12/2021 2:15 PM End time: 08/12/2021 2:20 PM Injection made incrementally with aspirations every 5 mL.  Performed by: Personally  Anesthesiologist: Pervis Hocking, DO  Additional Notes: Monitors applied. No increased pain on injection. No increased resistance to injection. Injection made in 5cc increments. Good needle visualization. Patient tolerated procedure well.

## 2021-08-12 NOTE — Discharge Instructions (Signed)

## 2021-08-12 NOTE — Op Note (Signed)
DATE OF SURGERY:  08/12/2021 TIME: 4:32 PM  PATIENT NAME:  Rachel Bates   AGE: 76 y.o.    PRE-OPERATIVE DIAGNOSIS: Left knee valgus osteoarthritis  POST-OPERATIVE DIAGNOSIS:  Same  PROCEDURE: LEFT total Knee Arthroplasty  SURGEON:  Johnny Bridge, MD   ASSISTANT:  Merlene Pulling, PA-C, present and scrubbed throughout the case, critical for assistance with exposure, retraction, instrumentation, and closure.   OPERATIVE IMPLANTS: Depuy Attune size 7 posterior Stabilized Femur, with a size 5 fixed Bearing Tibia, 6 polyethylene insert with a 38 medialized oval dome polyethylene patella.  PREOPERATIVE INDICATIONS:  PENNY ARRAMBIDE is a 76 y.o. year old female with end stage bone on bone degenerative arthritis of the knee who failed conservative treatment, including injections, antiinflammatories, activity modification, and assistive devices, and had significant impairment of their activities of daily living, and elected for Total Knee Arthroplasty.   The risks, benefits, and alternatives were discussed at length including but not limited to the risks of infection, bleeding, nerve injury, stiffness, blood clots, the need for revision surgery, cardiopulmonary complications, among others, and they were willing to proceed.  OPERATIVE FINDINGS AND UNIQUE ASPECTS OF THE CASE: Her patella was very thin, only measured 18 mm.  I cut the femur on 10, and the tibia on 3 measuring off of the lateral side.  My access was excellent throughout the case.  I debated trying to get to a 6 on the femur, but the 7 was definitely appropriate, although the 5 was the size on the tibia.  I am not sure why there was a 2 size mismatch, but her femur was very tall compared to the medial lateral width.  ESTIMATED BLOOD LOSS: 100 mL  OPERATIVE DESCRIPTION:  The patient was brought to the operative room and placed in a supine position.  Anesthesia was administered.  IV antibiotics were given.  The lower extremity was  prepped and draped in the usual sterile fashion.  Time out was performed.  The leg was elevated and exsanguinated and the tourniquet was inflated.  Anterior quadriceps tendon splitting approach was performed.  The patella was everted and osteophytes were removed.  The anterior horn of the medial and lateral meniscus was removed.   The patella was then measured, and cut with the saw.  The thickness before the cut was 18 and after the cut was 14.  A metal shield was used to protect the patella throughout the case.    The distal femur was opened with the drill and the intramedullary distal femoral cutting jig was utilized, set at 5 degrees resecting 10 mm off the distal femur.  Care was taken to protect the collateral ligaments.  Then the extramedullary tibial cutting jig was utilized making the appropriate cut using the anterior tibial crest as a reference building in appropriate posterior slope.  Care was taken during the cut to protect the medial and collateral ligaments.  The proximal tibia was removed along with the posterior horns of the menisci.  The PCL was sacrificed.    The extensor gap was measured and found to have adequate resection, measuring to a size 6.    The distal femoral sizing jig was applied, taking care to avoid notching.  This was set at 3 degrees of external rotation.  Then the 4-in-1 cutting jig was applied and the anterior and posterior femur was cut, along with the chamfer cuts.  All posterior osteophytes were removed.  The flexion gap was then measured and was symmetric with  the extension gap.  I completed the distal femoral preparation using the appropriate jig to prepare the box.  The proximal tibia sized and prepared accordingly with the reamer and the punch, and then all components were trialed with the poly insert.  The knee was found to have excellent balance and full motion.    The above named components were then cemented into place and all excess cement was  removed.  The real polyethylene implant was placed.  After the cement had cured I released the tourniquet and confirmed excellent hemostasis with no major posterior vessel injury.    The knee was easily taken through a range of motion and the patella tracked well and the knee irrigated copiously and the parapatellar and subcutaneous tissue closed with vicryl, and monocryl with steri strips for the skin.  The wounds were injected with marcaine, and dressed with sterile gauze and the patient was awakened and returned to the PACU in stable and satisfactory condition.  There were no complications.  Total tourniquet time was 65 minutes.

## 2021-08-13 DIAGNOSIS — Z79899 Other long term (current) drug therapy: Secondary | ICD-10-CM | POA: Diagnosis not present

## 2021-08-13 DIAGNOSIS — Z87891 Personal history of nicotine dependence: Secondary | ICD-10-CM | POA: Diagnosis not present

## 2021-08-13 DIAGNOSIS — I2510 Atherosclerotic heart disease of native coronary artery without angina pectoris: Secondary | ICD-10-CM | POA: Diagnosis not present

## 2021-08-13 DIAGNOSIS — Z20822 Contact with and (suspected) exposure to covid-19: Secondary | ICD-10-CM | POA: Diagnosis not present

## 2021-08-13 DIAGNOSIS — M1712 Unilateral primary osteoarthritis, left knee: Secondary | ICD-10-CM | POA: Diagnosis not present

## 2021-08-13 DIAGNOSIS — I252 Old myocardial infarction: Secondary | ICD-10-CM | POA: Diagnosis not present

## 2021-08-13 LAB — BASIC METABOLIC PANEL
Anion gap: 9 (ref 5–15)
BUN: 15 mg/dL (ref 8–23)
CO2: 23 mmol/L (ref 22–32)
Calcium: 8.4 mg/dL — ABNORMAL LOW (ref 8.9–10.3)
Chloride: 98 mmol/L (ref 98–111)
Creatinine, Ser: 0.85 mg/dL (ref 0.44–1.00)
GFR, Estimated: >60 mL/min (ref >60)
Glucose, Bld: 159 mg/dL — ABNORMAL HIGH (ref 70–99)
Potassium: 4.3 mmol/L (ref 3.5–5.1)
Sodium: 130 mmol/L — ABNORMAL LOW (ref 135–145)

## 2021-08-13 LAB — CBC
HCT: 39 % (ref 36.0–46.0)
Hemoglobin: 12 g/dL (ref 12.0–15.0)
MCH: 25.9 pg — ABNORMAL LOW (ref 26.0–34.0)
MCHC: 30.8 g/dL (ref 30.0–36.0)
MCV: 84.2 fL (ref 80.0–100.0)
Platelets: 95 10*3/uL — ABNORMAL LOW (ref 150–400)
RBC: 4.63 MIL/uL (ref 3.87–5.11)
RDW: 16.9 % — ABNORMAL HIGH (ref 11.5–15.5)
WBC: 6.4 10*3/uL (ref 4.0–10.5)
nRBC: 0 % (ref 0.0–0.2)

## 2021-08-13 NOTE — Progress Notes (Signed)
Orthopedic Tech Progress Note Patient Details:  Rachel Bates Feb 28, 1946 [id number]  Ortho Devices Type of Ortho Device: Bone foam zero knee Ortho Device/Splint Location: left Ortho Device/Splint Interventions: Application   Post Interventions Patient Tolerated: Well Instructions Provided: Care of device  [name] 08/13/2021, 11:08 AM

## 2021-08-13 NOTE — Plan of Care (Signed)
Patient dc'd  

## 2021-08-13 NOTE — Progress Notes (Signed)
° °  ORTHOPAEDIC PROGRESS NOTE  s/p Procedure(s): TOTAL KNEE ARTHROPLASTY on 2/3 with Dr. Mardelle Matte  SUBJECTIVE: Reports mild pain about operative site. Was able to get up on her own to go to the restroom. No chest pain. No SOB. No nausea/vomiting. No other complaints.  OBJECTIVE: PE: General: sitting up in hospital bed, NAD Cardiac: regular rate Pulmonary: No increased work of breathing LLE: ACE wrap removed. Dressing with only a small amount of strike-through. Neurovascularly intact. Sensation intact distally Intact pulses distally. Dorsiflexion/Plantar flexion intact  Vitals:   08/13/21 0508 08/13/21 0842  BP: 116/65 118/73  Pulse: 73 74  Resp: 17 16  Temp: 97.9 F (36.6 C)   SpO2: 95% 95%     ASSESSMENT: ELLISHA Bates is a 76 y.o. female doing well postoperatively. POD#1  PLAN: Weightbearing: WBAT LLE Insicional and dressing care: Reinforce dressings as needed Orthopedic device(s): None Showering: Post-op day #3 VTE prophylaxis: Aspirin Pain control: PRN pain medications Follow - up plan: 2 weeks in office with Dr. Preston Fleeting information: After hours and holidays please check [email or web address] for group call information for [facility]  Plan to discharge home today as long as she passes PT.   Noemi Chapel, PA-C 08/13/2021

## 2021-08-13 NOTE — Plan of Care (Signed)
°  Problem: Education: Goal: Knowledge of General Education information will improve Description: Including pain rating scale, medication(s)/side effects and non-pharmacologic comfort measures 08/13/2021 0752 by Olen Cordial, RN Outcome: Progressing 08/12/2021 1828 by Olen Cordial, RN Outcome: Progressing   Problem: Activity: Goal: Risk for activity intolerance will decrease 08/13/2021 0752 by Olen Cordial, RN Outcome: Progressing 08/12/2021 1828 by Olen Cordial, RN Outcome: Progressing   Problem: Safety: Goal: Ability to remain free from injury will improve 08/13/2021 0752 by Olen Cordial, RN Outcome: Progressing 08/12/2021 1828 by Olen Cordial, RN Outcome: Progressing

## 2021-08-13 NOTE — Progress Notes (Signed)
Patient sent with TED hose, KI, and icepacks. States she already has walker and commode at home

## 2021-08-13 NOTE — Evaluation (Signed)
Physical Therapy Evaluation Patient Details Name: Rachel Bates MRN: [id number] DOB: 14-May-1946 Today's Date: 08/13/2021  History of Present Illness  Pt s/p L TKR and with hx of AICD, CAD, CHF, ischemic cadiomyopathy, PAF, B THR and R TKR  Clinical Impression  Pt s/p L TKR and presents with functional mobility limitations 2* decreased L LE strength/ROM and post op pain.  Pt currently mobilizing at supervision level and very familiar with process as this is her 4rth joint replacement.  Pt comfortable with ability to manage at home and states her dtr will be assisting her.  Pt eager for dc home.     Recommendations for follow up therapy are one component of a multi-disciplinary discharge planning process, led by the attending physician.  Recommendations may be updated based on patient status, additional functional criteria and insurance authorization.  Follow Up Recommendations Home health PT    Assistance Recommended at Discharge Intermittent Supervision/Assistance  Patient can return home with the following  A little help with walking and/or transfers;A little help with bathing/dressing/bathroom;Assistance with cooking/housework;Assist for transportation;Help with stairs or ramp for entrance    Equipment Recommendations None recommended by PT  Recommendations for Other Services       Functional Status Assessment Patient has had a recent decline in their functional status and demonstrates the ability to make significant improvements in function in a reasonable and predictable amount of time.     Precautions / Restrictions Precautions Precautions: Knee;Fall Restrictions Weight Bearing Restrictions: No LLE Weight Bearing: Weight bearing as tolerated      Mobility  Bed Mobility               General bed mobility comments: Pt up in chair and requests back to same    Transfers Overall transfer level: Needs assistance Equipment used: Rolling walker (2 wheels) Transfers:  Sit to/from Stand Sit to Stand: Min guard, Supervision           General transfer comment: min cues for LE management    Ambulation/Gait Ambulation/Gait assistance: Min guard, Supervision Gait Distance (Feet): 200 Feet Assistive device: Rolling walker (2 wheels) Gait Pattern/deviations: Step-to pattern, Step-through pattern, Decreased step length - right, Decreased step length - left, Shuffle, Trunk flexed       General Gait Details: min cues for posture, position from RW and initial sequence  Stairs            Wheelchair Mobility    Modified Rankin (Stroke Patients Only)       Balance Overall balance assessment: Needs assistance Sitting-balance support: No upper extremity supported, Feet supported Sitting balance-Leahy Scale: Good     Standing balance support: No upper extremity supported Standing balance-Leahy Scale: Poor                               Pertinent Vitals/Pain Pain Assessment Pain Assessment: 0-10 Pain Score: 5  Pain Location: L knee Pain Descriptors / Indicators: Aching, Sore Pain Intervention(s): Limited activity within patient's tolerance, Monitored during session, Premedicated before session, Ice applied    Home Living Family/patient expects to be discharged to:: Private residence Living Arrangements: Children Available Help at Discharge: Family Type of Home: House Home Access: Level entry       Home Layout: One level Home Equipment: Adaptive equipment;Rolling Environmental consultant (2 wheels);BSC/3in1 Additional Comments: sleeps in recliner bc of back issues    Prior Function Prior Level of Function : Independent/Modified Independent  Hand Dominance   Dominant Hand: Right    Extremity/Trunk Assessment   Upper Extremity Assessment Upper Extremity Assessment: Overall WFL for tasks assessed    Lower Extremity Assessment Lower Extremity Assessment: LLE deficits/detail LLE Deficits / Details: -5 -  75 AAROM at knee       Communication   Communication: No difficulties  Cognition Arousal/Alertness: Awake/alert Behavior During Therapy: WFL for tasks assessed/performed Overall Cognitive Status: Within Functional Limits for tasks assessed                                          General Comments      Exercises Total Joint Exercises Ankle Circles/Pumps: AROM, Both, 15 reps, Supine Quad Sets: AROM, Both, 10 reps, Supine Heel Slides: AAROM, Left, Supine, 15 reps Straight Leg Raises: AAROM, Left, 15 reps, Supine   Assessment/Plan    PT Assessment Patient needs continued PT services  PT Problem List Decreased strength;Decreased range of motion;Decreased activity tolerance;Decreased balance;Decreased mobility;Decreased knowledge of use of DME;Pain       PT Treatment Interventions DME instruction;Gait training;Stair training;Functional mobility training;Therapeutic activities;Therapeutic exercise;Patient/family education    PT Goals (Current goals can be found in the Care Plan section)  Acute Rehab PT Goals Patient Stated Goal: HOME PT Goal Formulation: All assessment and education complete, DC therapy    Frequency Min 1X/week     Co-evaluation               AM-PAC PT "6 Clicks" Mobility  Outcome Measure Help needed turning from your back to your side while in a flat bed without using bedrails?: A Little Help needed moving from lying on your back to sitting on the side of a flat bed without using bedrails?: A Little Help needed moving to and from a bed to a chair (including a wheelchair)?: A Little Help needed standing up from a chair using your arms (e.g., wheelchair or bedside chair)?: A Little Help needed to walk in hospital room?: A Little Help needed climbing 3-5 steps with a railing? : A Little 6 Click Score: 18    End of Session Equipment Utilized During Treatment: Gait belt Activity Tolerance: Patient tolerated treatment well;No increased  pain;Patient limited by fatigue;Patient limited by pain Patient left: in chair;with call bell/phone within reach Nurse Communication: Mobility status PT Visit Diagnosis: Difficulty in walking, not elsewhere classified (R26.2)    Time: 5027-7412 PT Time Calculation (min) (ACUTE ONLY): 30 min   Charges:   PT Evaluation $PT Eval Low Complexity: 1 Low PT Treatments $Gait Training: 8-22 mins        [place] Pager [phone] Office [phone]   Tinia Oravec 08/13/2021, 12:04 PM

## 2021-08-15 ENCOUNTER — Encounter (HOSPITAL_COMMUNITY): Payer: Self-pay | Admitting: Orthopedic Surgery

## 2021-08-15 DIAGNOSIS — Z95810 Presence of automatic (implantable) cardiac defibrillator: Secondary | ICD-10-CM | POA: Diagnosis not present

## 2021-08-15 DIAGNOSIS — G8929 Other chronic pain: Secondary | ICD-10-CM | POA: Diagnosis not present

## 2021-08-15 DIAGNOSIS — Z9181 History of falling: Secondary | ICD-10-CM | POA: Diagnosis not present

## 2021-08-15 DIAGNOSIS — G43909 Migraine, unspecified, not intractable, without status migrainosus: Secondary | ICD-10-CM | POA: Diagnosis not present

## 2021-08-15 DIAGNOSIS — I4891 Unspecified atrial fibrillation: Secondary | ICD-10-CM | POA: Diagnosis not present

## 2021-08-15 DIAGNOSIS — M545 Low back pain, unspecified: Secondary | ICD-10-CM | POA: Diagnosis not present

## 2021-08-15 DIAGNOSIS — Z7901 Long term (current) use of anticoagulants: Secondary | ICD-10-CM | POA: Diagnosis not present

## 2021-08-15 DIAGNOSIS — Z96643 Presence of artificial hip joint, bilateral: Secondary | ICD-10-CM | POA: Diagnosis not present

## 2021-08-15 DIAGNOSIS — F419 Anxiety disorder, unspecified: Secondary | ICD-10-CM | POA: Diagnosis not present

## 2021-08-15 DIAGNOSIS — I509 Heart failure, unspecified: Secondary | ICD-10-CM | POA: Diagnosis not present

## 2021-08-15 DIAGNOSIS — Z955 Presence of coronary angioplasty implant and graft: Secondary | ICD-10-CM | POA: Diagnosis not present

## 2021-08-15 DIAGNOSIS — E785 Hyperlipidemia, unspecified: Secondary | ICD-10-CM | POA: Diagnosis not present

## 2021-08-15 DIAGNOSIS — Z471 Aftercare following joint replacement surgery: Secondary | ICD-10-CM | POA: Diagnosis not present

## 2021-08-15 DIAGNOSIS — Z96653 Presence of artificial knee joint, bilateral: Secondary | ICD-10-CM | POA: Diagnosis not present

## 2021-08-15 DIAGNOSIS — I252 Old myocardial infarction: Secondary | ICD-10-CM | POA: Diagnosis not present

## 2021-08-15 DIAGNOSIS — F32A Depression, unspecified: Secondary | ICD-10-CM | POA: Diagnosis not present

## 2021-08-16 NOTE — Anesthesia Postprocedure Evaluation (Signed)
Anesthesia Post Note  Patient: Rachel Bates  Procedure(s) Performed: TOTAL KNEE ARTHROPLASTY (Left: Knee)     Patient location during evaluation: PACU Anesthesia Type: General and Regional Level of consciousness: awake and alert, oriented and patient cooperative Pain management: pain level controlled Vital Signs Assessment: post-procedure vital signs reviewed and stable Respiratory status: spontaneous breathing, nonlabored ventilation and respiratory function stable Cardiovascular status: blood pressure returned to baseline and stable Postop Assessment: no apparent nausea or vomiting Anesthetic complications: no   No notable events documented.  Last Vitals:  Vitals:   08/13/21 0508 08/13/21 0842  BP: 116/65 118/73  Pulse: 73 74  Resp: 17 16  Temp: 36.6 C   SpO2: 95% 95%    Last Pain:  Vitals:   08/13/21 0916  TempSrc:   PainSc: [place]

## 2021-08-17 DIAGNOSIS — I509 Heart failure, unspecified: Secondary | ICD-10-CM | POA: Diagnosis not present

## 2021-08-17 DIAGNOSIS — Z471 Aftercare following joint replacement surgery: Secondary | ICD-10-CM | POA: Diagnosis not present

## 2021-08-17 DIAGNOSIS — M545 Low back pain, unspecified: Secondary | ICD-10-CM | POA: Diagnosis not present

## 2021-08-17 DIAGNOSIS — G8929 Other chronic pain: Secondary | ICD-10-CM | POA: Diagnosis not present

## 2021-08-17 DIAGNOSIS — Z96653 Presence of artificial knee joint, bilateral: Secondary | ICD-10-CM | POA: Diagnosis not present

## 2021-08-17 DIAGNOSIS — M1712 Unilateral primary osteoarthritis, left knee: Secondary | ICD-10-CM | POA: Diagnosis not present

## 2021-08-17 DIAGNOSIS — I4891 Unspecified atrial fibrillation: Secondary | ICD-10-CM | POA: Diagnosis not present

## 2021-08-18 DIAGNOSIS — M539 Dorsopathy, unspecified: Secondary | ICD-10-CM | POA: Diagnosis not present

## 2021-08-18 DIAGNOSIS — E782 Mixed hyperlipidemia: Secondary | ICD-10-CM | POA: Diagnosis not present

## 2021-08-18 DIAGNOSIS — I4891 Unspecified atrial fibrillation: Secondary | ICD-10-CM | POA: Diagnosis not present

## 2021-08-18 DIAGNOSIS — Z96652 Presence of left artificial knee joint: Secondary | ICD-10-CM | POA: Diagnosis not present

## 2021-08-18 DIAGNOSIS — I10 Essential (primary) hypertension: Secondary | ICD-10-CM | POA: Diagnosis not present

## 2021-08-18 DIAGNOSIS — Z96653 Presence of artificial knee joint, bilateral: Secondary | ICD-10-CM | POA: Diagnosis not present

## 2021-08-18 DIAGNOSIS — F339 Major depressive disorder, recurrent, unspecified: Secondary | ICD-10-CM | POA: Diagnosis not present

## 2021-08-18 DIAGNOSIS — Z79899 Other long term (current) drug therapy: Secondary | ICD-10-CM | POA: Diagnosis not present

## 2021-08-18 DIAGNOSIS — I2510 Atherosclerotic heart disease of native coronary artery without angina pectoris: Secondary | ICD-10-CM | POA: Diagnosis not present

## 2021-08-18 DIAGNOSIS — Z95810 Presence of automatic (implantable) cardiac defibrillator: Secondary | ICD-10-CM | POA: Diagnosis not present

## 2021-08-18 DIAGNOSIS — Z6828 Body mass index (BMI) 28.0-28.9, adult: Secondary | ICD-10-CM | POA: Diagnosis not present

## 2021-08-18 DIAGNOSIS — I509 Heart failure, unspecified: Secondary | ICD-10-CM | POA: Diagnosis not present

## 2021-08-18 DIAGNOSIS — Z471 Aftercare following joint replacement surgery: Secondary | ICD-10-CM | POA: Diagnosis not present

## 2021-08-18 DIAGNOSIS — M545 Low back pain, unspecified: Secondary | ICD-10-CM | POA: Diagnosis not present

## 2021-08-18 DIAGNOSIS — G8929 Other chronic pain: Secondary | ICD-10-CM | POA: Diagnosis not present

## 2021-08-18 DIAGNOSIS — R739 Hyperglycemia, unspecified: Secondary | ICD-10-CM | POA: Diagnosis not present

## 2021-08-18 DIAGNOSIS — D696 Thrombocytopenia, unspecified: Secondary | ICD-10-CM | POA: Diagnosis not present

## 2021-08-20 DIAGNOSIS — M545 Low back pain, unspecified: Secondary | ICD-10-CM | POA: Diagnosis not present

## 2021-08-20 DIAGNOSIS — I4891 Unspecified atrial fibrillation: Secondary | ICD-10-CM | POA: Diagnosis not present

## 2021-08-20 DIAGNOSIS — G8929 Other chronic pain: Secondary | ICD-10-CM | POA: Diagnosis not present

## 2021-08-20 DIAGNOSIS — I509 Heart failure, unspecified: Secondary | ICD-10-CM | POA: Diagnosis not present

## 2021-08-20 DIAGNOSIS — Z96653 Presence of artificial knee joint, bilateral: Secondary | ICD-10-CM | POA: Diagnosis not present

## 2021-08-20 DIAGNOSIS — Z471 Aftercare following joint replacement surgery: Secondary | ICD-10-CM | POA: Diagnosis not present

## 2021-08-23 DIAGNOSIS — I509 Heart failure, unspecified: Secondary | ICD-10-CM | POA: Diagnosis not present

## 2021-08-23 DIAGNOSIS — Z96653 Presence of artificial knee joint, bilateral: Secondary | ICD-10-CM | POA: Diagnosis not present

## 2021-08-23 DIAGNOSIS — G8929 Other chronic pain: Secondary | ICD-10-CM | POA: Diagnosis not present

## 2021-08-23 DIAGNOSIS — Z471 Aftercare following joint replacement surgery: Secondary | ICD-10-CM | POA: Diagnosis not present

## 2021-08-23 DIAGNOSIS — M545 Low back pain, unspecified: Secondary | ICD-10-CM | POA: Diagnosis not present

## 2021-08-23 DIAGNOSIS — I4891 Unspecified atrial fibrillation: Secondary | ICD-10-CM | POA: Diagnosis not present

## 2021-08-24 ENCOUNTER — Other Ambulatory Visit: Payer: Self-pay | Admitting: Internal Medicine

## 2021-08-24 DIAGNOSIS — I480 Paroxysmal atrial fibrillation: Secondary | ICD-10-CM

## 2021-08-24 DIAGNOSIS — Z471 Aftercare following joint replacement surgery: Secondary | ICD-10-CM | POA: Diagnosis not present

## 2021-08-24 DIAGNOSIS — I509 Heart failure, unspecified: Secondary | ICD-10-CM | POA: Diagnosis not present

## 2021-08-24 DIAGNOSIS — G8929 Other chronic pain: Secondary | ICD-10-CM | POA: Diagnosis not present

## 2021-08-24 DIAGNOSIS — M545 Low back pain, unspecified: Secondary | ICD-10-CM | POA: Diagnosis not present

## 2021-08-24 DIAGNOSIS — M1712 Unilateral primary osteoarthritis, left knee: Secondary | ICD-10-CM | POA: Diagnosis not present

## 2021-08-24 DIAGNOSIS — Z96653 Presence of artificial knee joint, bilateral: Secondary | ICD-10-CM | POA: Diagnosis not present

## 2021-08-24 DIAGNOSIS — I4891 Unspecified atrial fibrillation: Secondary | ICD-10-CM | POA: Diagnosis not present

## 2021-08-24 NOTE — Telephone Encounter (Signed)
Prescription refill request for Eliquis received. Indication: Afib  Last office visit: 05/30/21 Lovena Le)  Scr: 0.85 (08/13/21)  Age: 76 Weight: 78kg  Appropriate dose and refill sent to requested pharmacy.

## 2021-08-25 DIAGNOSIS — M25562 Pain in left knee: Secondary | ICD-10-CM | POA: Diagnosis not present

## 2021-08-25 DIAGNOSIS — M1712 Unilateral primary osteoarthritis, left knee: Secondary | ICD-10-CM | POA: Diagnosis not present

## 2021-08-25 DIAGNOSIS — Z96652 Presence of left artificial knee joint: Secondary | ICD-10-CM | POA: Diagnosis not present

## 2021-08-26 DIAGNOSIS — M1712 Unilateral primary osteoarthritis, left knee: Secondary | ICD-10-CM | POA: Diagnosis not present

## 2021-08-29 DIAGNOSIS — Z96652 Presence of left artificial knee joint: Secondary | ICD-10-CM | POA: Diagnosis not present

## 2021-08-29 DIAGNOSIS — M25562 Pain in left knee: Secondary | ICD-10-CM | POA: Diagnosis not present

## 2021-08-29 DIAGNOSIS — M1712 Unilateral primary osteoarthritis, left knee: Secondary | ICD-10-CM | POA: Diagnosis not present

## 2021-09-01 DIAGNOSIS — M1712 Unilateral primary osteoarthritis, left knee: Secondary | ICD-10-CM | POA: Diagnosis not present

## 2021-09-01 DIAGNOSIS — Z96652 Presence of left artificial knee joint: Secondary | ICD-10-CM | POA: Diagnosis not present

## 2021-09-01 DIAGNOSIS — M25562 Pain in left knee: Secondary | ICD-10-CM | POA: Diagnosis not present

## 2021-09-06 ENCOUNTER — Ambulatory Visit (INDEPENDENT_AMBULATORY_CARE_PROVIDER_SITE_OTHER): Payer: Medicare Other

## 2021-09-06 DIAGNOSIS — I255 Ischemic cardiomyopathy: Secondary | ICD-10-CM

## 2021-09-07 LAB — CUP PACEART REMOTE DEVICE CHECK
Battery Remaining Longevity: 17 mo
Battery Voltage: 2.89 V
Brady Statistic AP VP Percent: 0.01 %
Brady Statistic AP VS Percent: 7.09 %
Brady Statistic AS VP Percent: 0.03 %
Brady Statistic AS VS Percent: 92.87 %
Brady Statistic RA Percent Paced: 7.02 %
Brady Statistic RV Percent Paced: 0.04 %
Date Time Interrogation Session: 20230228133225
HighPow Impedance: 57 Ohm
HighPow Impedance: 76 Ohm
Implantable Lead Implant Date: 20071003
Implantable Lead Implant Date: 20071019
Implantable Lead Location: 753859
Implantable Lead Location: 753860
Implantable Lead Model: 5076
Implantable Lead Model: 6947
Implantable Pulse Generator Implant Date: 20140729
Lead Channel Impedance Value: 304 Ohm
Lead Channel Impedance Value: 399 Ohm
Lead Channel Impedance Value: 475 Ohm
Lead Channel Pacing Threshold Amplitude: 0.625 V
Lead Channel Pacing Threshold Amplitude: 1 V
Lead Channel Pacing Threshold Pulse Width: 0.4 ms
Lead Channel Pacing Threshold Pulse Width: 0.4 ms
Lead Channel Sensing Intrinsic Amplitude: 3.625 mV
Lead Channel Sensing Intrinsic Amplitude: 3.625 mV
Lead Channel Sensing Intrinsic Amplitude: 7.5 mV
Lead Channel Sensing Intrinsic Amplitude: 7.5 mV
Lead Channel Setting Pacing Amplitude: 2 V
Lead Channel Setting Pacing Amplitude: 2.5 V
Lead Channel Setting Pacing Pulse Width: 0.4 ms
Lead Channel Setting Sensing Sensitivity: 0.3 mV

## 2021-09-08 DIAGNOSIS — Z96652 Presence of left artificial knee joint: Secondary | ICD-10-CM | POA: Diagnosis not present

## 2021-09-08 DIAGNOSIS — M1712 Unilateral primary osteoarthritis, left knee: Secondary | ICD-10-CM | POA: Diagnosis not present

## 2021-09-08 DIAGNOSIS — M25562 Pain in left knee: Secondary | ICD-10-CM | POA: Diagnosis not present

## 2021-09-12 DIAGNOSIS — Z96652 Presence of left artificial knee joint: Secondary | ICD-10-CM | POA: Diagnosis not present

## 2021-09-12 DIAGNOSIS — M25562 Pain in left knee: Secondary | ICD-10-CM | POA: Diagnosis not present

## 2021-09-12 DIAGNOSIS — M1712 Unilateral primary osteoarthritis, left knee: Secondary | ICD-10-CM | POA: Diagnosis not present

## 2021-09-14 NOTE — Progress Notes (Signed)
Remote ICD transmission.   

## 2021-09-15 DIAGNOSIS — Z96652 Presence of left artificial knee joint: Secondary | ICD-10-CM | POA: Diagnosis not present

## 2021-09-15 DIAGNOSIS — M25562 Pain in left knee: Secondary | ICD-10-CM | POA: Diagnosis not present

## 2021-09-15 DIAGNOSIS — M1712 Unilateral primary osteoarthritis, left knee: Secondary | ICD-10-CM | POA: Diagnosis not present

## 2021-09-22 DIAGNOSIS — Z96652 Presence of left artificial knee joint: Secondary | ICD-10-CM | POA: Diagnosis not present

## 2021-09-22 DIAGNOSIS — M1712 Unilateral primary osteoarthritis, left knee: Secondary | ICD-10-CM | POA: Diagnosis not present

## 2021-09-22 DIAGNOSIS — M25562 Pain in left knee: Secondary | ICD-10-CM | POA: Diagnosis not present

## 2021-09-26 DIAGNOSIS — M1712 Unilateral primary osteoarthritis, left knee: Secondary | ICD-10-CM | POA: Diagnosis not present

## 2021-09-26 DIAGNOSIS — Z96652 Presence of left artificial knee joint: Secondary | ICD-10-CM | POA: Diagnosis not present

## 2021-09-26 DIAGNOSIS — M25562 Pain in left knee: Secondary | ICD-10-CM | POA: Diagnosis not present

## 2021-10-04 DIAGNOSIS — Z96652 Presence of left artificial knee joint: Secondary | ICD-10-CM | POA: Diagnosis not present

## 2021-10-04 DIAGNOSIS — M1712 Unilateral primary osteoarthritis, left knee: Secondary | ICD-10-CM | POA: Diagnosis not present

## 2021-10-04 DIAGNOSIS — M25562 Pain in left knee: Secondary | ICD-10-CM | POA: Diagnosis not present

## 2021-10-12 DIAGNOSIS — M1712 Unilateral primary osteoarthritis, left knee: Secondary | ICD-10-CM | POA: Diagnosis not present

## 2021-10-18 DIAGNOSIS — Z6827 Body mass index (BMI) 27.0-27.9, adult: Secondary | ICD-10-CM | POA: Diagnosis not present

## 2021-10-18 DIAGNOSIS — G5601 Carpal tunnel syndrome, right upper limb: Secondary | ICD-10-CM | POA: Diagnosis not present

## 2021-10-18 DIAGNOSIS — M539 Dorsopathy, unspecified: Secondary | ICD-10-CM | POA: Diagnosis not present

## 2021-10-28 DIAGNOSIS — M1712 Unilateral primary osteoarthritis, left knee: Secondary | ICD-10-CM | POA: Diagnosis not present

## 2021-12-06 ENCOUNTER — Ambulatory Visit (INDEPENDENT_AMBULATORY_CARE_PROVIDER_SITE_OTHER): Payer: Medicare Other

## 2021-12-06 DIAGNOSIS — I4729 Other ventricular tachycardia: Secondary | ICD-10-CM

## 2021-12-06 LAB — CUP PACEART REMOTE DEVICE CHECK
Battery Remaining Longevity: 14 mo
Battery Voltage: 2.88 V
Brady Statistic AP VP Percent: 0.01 %
Brady Statistic AP VS Percent: 7.5 %
Brady Statistic AS VP Percent: 0.03 %
Brady Statistic AS VS Percent: 92.46 %
Brady Statistic RA Percent Paced: 7.45 %
Brady Statistic RV Percent Paced: 0.04 %
Date Time Interrogation Session: 20230530085621
HighPow Impedance: 57 Ohm
HighPow Impedance: 72 Ohm
Implantable Lead Implant Date: 20071003
Implantable Lead Implant Date: 20071019
Implantable Lead Location: 753859
Implantable Lead Location: 753860
Implantable Lead Model: 5076
Implantable Lead Model: 6947
Implantable Pulse Generator Implant Date: 20140729
Lead Channel Impedance Value: 304 Ohm
Lead Channel Impedance Value: 399 Ohm
Lead Channel Impedance Value: 513 Ohm
Lead Channel Pacing Threshold Amplitude: 0.5 V
Lead Channel Pacing Threshold Amplitude: 1 V
Lead Channel Pacing Threshold Pulse Width: 0.4 ms
Lead Channel Pacing Threshold Pulse Width: 0.4 ms
Lead Channel Sensing Intrinsic Amplitude: 2.25 mV
Lead Channel Sensing Intrinsic Amplitude: 2.25 mV
Lead Channel Sensing Intrinsic Amplitude: 8.875 mV
Lead Channel Sensing Intrinsic Amplitude: 8.875 mV
Lead Channel Setting Pacing Amplitude: 2 V
Lead Channel Setting Pacing Amplitude: 2.5 V
Lead Channel Setting Pacing Pulse Width: 0.4 ms
Lead Channel Setting Sensing Sensitivity: 0.3 mV

## 2021-12-19 NOTE — Progress Notes (Signed)
Remote ICD transmission.   

## 2022-01-13 DIAGNOSIS — F339 Major depressive disorder, recurrent, unspecified: Secondary | ICD-10-CM | POA: Diagnosis not present

## 2022-01-13 DIAGNOSIS — E782 Mixed hyperlipidemia: Secondary | ICD-10-CM | POA: Diagnosis not present

## 2022-01-13 DIAGNOSIS — I10 Essential (primary) hypertension: Secondary | ICD-10-CM | POA: Diagnosis not present

## 2022-01-13 DIAGNOSIS — Z6827 Body mass index (BMI) 27.0-27.9, adult: Secondary | ICD-10-CM | POA: Diagnosis not present

## 2022-01-13 DIAGNOSIS — I2510 Atherosclerotic heart disease of native coronary artery without angina pectoris: Secondary | ICD-10-CM | POA: Diagnosis not present

## 2022-01-13 DIAGNOSIS — Z79899 Other long term (current) drug therapy: Secondary | ICD-10-CM | POA: Diagnosis not present

## 2022-01-13 DIAGNOSIS — G43909 Migraine, unspecified, not intractable, without status migrainosus: Secondary | ICD-10-CM | POA: Diagnosis not present

## 2022-01-13 DIAGNOSIS — G629 Polyneuropathy, unspecified: Secondary | ICD-10-CM | POA: Diagnosis not present

## 2022-01-13 DIAGNOSIS — G4700 Insomnia, unspecified: Secondary | ICD-10-CM | POA: Diagnosis not present

## 2022-03-02 DIAGNOSIS — Z23 Encounter for immunization: Secondary | ICD-10-CM | POA: Diagnosis not present

## 2022-03-07 ENCOUNTER — Ambulatory Visit (INDEPENDENT_AMBULATORY_CARE_PROVIDER_SITE_OTHER): Payer: Medicare Other

## 2022-03-07 DIAGNOSIS — Z961 Presence of intraocular lens: Secondary | ICD-10-CM | POA: Diagnosis not present

## 2022-03-07 DIAGNOSIS — H43813 Vitreous degeneration, bilateral: Secondary | ICD-10-CM | POA: Diagnosis not present

## 2022-03-07 DIAGNOSIS — I255 Ischemic cardiomyopathy: Secondary | ICD-10-CM | POA: Diagnosis not present

## 2022-03-07 DIAGNOSIS — H02839 Dermatochalasis of unspecified eye, unspecified eyelid: Secondary | ICD-10-CM | POA: Diagnosis not present

## 2022-03-07 DIAGNOSIS — H532 Diplopia: Secondary | ICD-10-CM | POA: Diagnosis not present

## 2022-03-07 DIAGNOSIS — H26493 Other secondary cataract, bilateral: Secondary | ICD-10-CM | POA: Diagnosis not present

## 2022-03-08 LAB — CUP PACEART REMOTE DEVICE CHECK
Battery Remaining Longevity: 12 mo
Battery Voltage: 2.86 V
Brady Statistic AP VP Percent: 0.01 %
Brady Statistic AP VS Percent: 6.39 %
Brady Statistic AS VP Percent: 0.03 %
Brady Statistic AS VS Percent: 93.57 %
Brady Statistic RA Percent Paced: 6.35 %
Brady Statistic RV Percent Paced: 0.04 %
Date Time Interrogation Session: 20230829092941
HighPow Impedance: 60 Ohm
HighPow Impedance: 77 Ohm
Implantable Lead Implant Date: 20071003
Implantable Lead Implant Date: 20071019
Implantable Lead Location: 753859
Implantable Lead Location: 753860
Implantable Lead Model: 5076
Implantable Lead Model: 6947
Implantable Pulse Generator Implant Date: 20140729
Lead Channel Impedance Value: 342 Ohm
Lead Channel Impedance Value: 399 Ohm
Lead Channel Impedance Value: 475 Ohm
Lead Channel Pacing Threshold Amplitude: 0.5 V
Lead Channel Pacing Threshold Amplitude: 1.125 V
Lead Channel Pacing Threshold Pulse Width: 0.4 ms
Lead Channel Pacing Threshold Pulse Width: 0.4 ms
Lead Channel Sensing Intrinsic Amplitude: 3.625 mV
Lead Channel Sensing Intrinsic Amplitude: 3.625 mV
Lead Channel Sensing Intrinsic Amplitude: 7.625 mV
Lead Channel Sensing Intrinsic Amplitude: 7.625 mV
Lead Channel Setting Pacing Amplitude: 2 V
Lead Channel Setting Pacing Amplitude: 2.5 V
Lead Channel Setting Pacing Pulse Width: 0.4 ms
Lead Channel Setting Sensing Sensitivity: 0.3 mV

## 2022-03-31 NOTE — Progress Notes (Signed)
Remote ICD transmission.   

## 2022-05-01 ENCOUNTER — Telehealth: Payer: Self-pay

## 2022-05-01 NOTE — Patient Outreach (Signed)
  Care Coordination   Initial Visit Note   05/01/2022 Name: Rachel Bates MRN: [id number] DOB: July 13, 1945  Rachel Bates is a 76 y.o. year old female who sees O'Buch, Beattie, PA-C for primary care. I spoke with  Rachel Bates by phone today.  What matters to the patients health and wellness today?  Placed call to patient to review and offer [facility] care coordination program. Patient reports that she is doing well. Denies any needs at this time.   SDOH assessments and interventions completed:  No     Care Coordination Interventions Activated:  No  Care Coordination Interventions:  No, not indicated   Follow up plan: No further intervention required.   Encounter Outcome:  Pt. Refused   Tomasa Rand, RN, BSN, CEN [facility] [employer] [phone]

## 2022-05-18 DIAGNOSIS — M542 Cervicalgia: Secondary | ICD-10-CM | POA: Diagnosis not present

## 2022-05-18 DIAGNOSIS — Z6828 Body mass index (BMI) 28.0-28.9, adult: Secondary | ICD-10-CM | POA: Diagnosis not present

## 2022-05-18 DIAGNOSIS — M62838 Other muscle spasm: Secondary | ICD-10-CM | POA: Diagnosis not present

## 2022-06-06 ENCOUNTER — Ambulatory Visit (INDEPENDENT_AMBULATORY_CARE_PROVIDER_SITE_OTHER): Payer: Medicare Other

## 2022-06-06 DIAGNOSIS — I255 Ischemic cardiomyopathy: Secondary | ICD-10-CM | POA: Diagnosis not present

## 2022-06-06 DIAGNOSIS — Z95810 Presence of automatic (implantable) cardiac defibrillator: Secondary | ICD-10-CM

## 2022-06-06 LAB — CUP PACEART REMOTE DEVICE CHECK
Battery Remaining Longevity: 8 mo
Battery Voltage: 2.84 V
Brady Statistic AP VP Percent: 0.01 %
Brady Statistic AP VS Percent: 5.5 %
Brady Statistic AS VP Percent: 0.03 %
Brady Statistic AS VS Percent: 94.47 %
Brady Statistic RA Percent Paced: 5.48 %
Brady Statistic RV Percent Paced: 0.03 %
Date Time Interrogation Session: 20231128084020
HighPow Impedance: 52 Ohm
HighPow Impedance: 66 Ohm
Implantable Lead Connection Status: 753985
Implantable Lead Connection Status: 753985
Implantable Lead Implant Date: 20071003
Implantable Lead Implant Date: 20071019
Implantable Lead Location: 753859
Implantable Lead Location: 753860
Implantable Lead Model: 5076
Implantable Lead Model: 6947
Implantable Pulse Generator Implant Date: 20140729
Lead Channel Impedance Value: 304 Ohm
Lead Channel Impedance Value: 399 Ohm
Lead Channel Impedance Value: 475 Ohm
Lead Channel Pacing Threshold Amplitude: 0.5 V
Lead Channel Pacing Threshold Amplitude: 1.125 V
Lead Channel Pacing Threshold Pulse Width: 0.4 ms
Lead Channel Pacing Threshold Pulse Width: 0.4 ms
Lead Channel Sensing Intrinsic Amplitude: 2.375 mV
Lead Channel Sensing Intrinsic Amplitude: 2.375 mV
Lead Channel Sensing Intrinsic Amplitude: 7.5 mV
Lead Channel Sensing Intrinsic Amplitude: 7.5 mV
Lead Channel Setting Pacing Amplitude: 2 V
Lead Channel Setting Pacing Amplitude: 2.5 V
Lead Channel Setting Pacing Pulse Width: 0.4 ms
Lead Channel Setting Sensing Sensitivity: 0.3 mV
Zone Setting Status: 755011

## 2022-06-09 DIAGNOSIS — J029 Acute pharyngitis, unspecified: Secondary | ICD-10-CM | POA: Diagnosis not present

## 2022-06-09 DIAGNOSIS — Z6828 Body mass index (BMI) 28.0-28.9, adult: Secondary | ICD-10-CM | POA: Diagnosis not present

## 2022-06-19 ENCOUNTER — Emergency Department (HOSPITAL_COMMUNITY): Payer: Medicare Other | Admitting: Anesthesiology

## 2022-06-19 ENCOUNTER — Other Ambulatory Visit: Payer: Self-pay

## 2022-06-19 ENCOUNTER — Encounter (HOSPITAL_COMMUNITY)
Admission: EM | Disposition: A | Discharge status: Home / Self Care | Payer: Self-pay | Source: Home / Self Care | Attending: Emergency Medicine

## 2022-06-19 ENCOUNTER — Emergency Department (EMERGENCY_DEPARTMENT_HOSPITAL): Payer: Medicare Other | Admitting: Anesthesiology

## 2022-06-19 ENCOUNTER — Encounter (HOSPITAL_BASED_OUTPATIENT_CLINIC_OR_DEPARTMENT_OTHER): Payer: Self-pay | Admitting: Emergency Medicine

## 2022-06-19 ENCOUNTER — Ambulatory Visit (HOSPITAL_BASED_OUTPATIENT_CLINIC_OR_DEPARTMENT_OTHER)
Admission: EM | Admit: 2022-06-19 | Discharge: 2022-06-19 | Disposition: A | Discharge status: Home / Self Care | Payer: Medicare Other | Attending: Emergency Medicine | Admitting: Emergency Medicine

## 2022-06-19 DIAGNOSIS — W44F3XA Food entering into or through a natural orifice, initial encounter: Secondary | ICD-10-CM | POA: Diagnosis not present

## 2022-06-19 DIAGNOSIS — Z87891 Personal history of nicotine dependence: Secondary | ICD-10-CM | POA: Diagnosis not present

## 2022-06-19 DIAGNOSIS — K269 Duodenal ulcer, unspecified as acute or chronic, without hemorrhage or perforation: Secondary | ICD-10-CM | POA: Diagnosis not present

## 2022-06-19 DIAGNOSIS — Z79899 Other long term (current) drug therapy: Secondary | ICD-10-CM | POA: Insufficient documentation

## 2022-06-19 DIAGNOSIS — I252 Old myocardial infarction: Secondary | ICD-10-CM | POA: Diagnosis not present

## 2022-06-19 DIAGNOSIS — I4891 Unspecified atrial fibrillation: Secondary | ICD-10-CM | POA: Diagnosis not present

## 2022-06-19 DIAGNOSIS — Z95810 Presence of automatic (implantable) cardiac defibrillator: Secondary | ICD-10-CM | POA: Diagnosis not present

## 2022-06-19 DIAGNOSIS — K2970 Gastritis, unspecified, without bleeding: Secondary | ICD-10-CM | POA: Diagnosis not present

## 2022-06-19 DIAGNOSIS — K222 Esophageal obstruction: Secondary | ICD-10-CM | POA: Diagnosis not present

## 2022-06-19 DIAGNOSIS — Z7901 Long term (current) use of anticoagulants: Secondary | ICD-10-CM | POA: Insufficient documentation

## 2022-06-19 DIAGNOSIS — M1990 Unspecified osteoarthritis, unspecified site: Secondary | ICD-10-CM | POA: Diagnosis not present

## 2022-06-19 DIAGNOSIS — G4730 Sleep apnea, unspecified: Secondary | ICD-10-CM | POA: Diagnosis not present

## 2022-06-19 DIAGNOSIS — Z86718 Personal history of other venous thrombosis and embolism: Secondary | ICD-10-CM | POA: Insufficient documentation

## 2022-06-19 DIAGNOSIS — F418 Other specified anxiety disorders: Secondary | ICD-10-CM | POA: Diagnosis not present

## 2022-06-19 DIAGNOSIS — F32A Depression, unspecified: Secondary | ICD-10-CM | POA: Insufficient documentation

## 2022-06-19 DIAGNOSIS — I509 Heart failure, unspecified: Secondary | ICD-10-CM | POA: Insufficient documentation

## 2022-06-19 DIAGNOSIS — K209 Esophagitis, unspecified without bleeding: Secondary | ICD-10-CM | POA: Diagnosis not present

## 2022-06-19 DIAGNOSIS — T18128A Food in esophagus causing other injury, initial encounter: Secondary | ICD-10-CM | POA: Diagnosis not present

## 2022-06-19 DIAGNOSIS — R1310 Dysphagia, unspecified: Secondary | ICD-10-CM | POA: Diagnosis not present

## 2022-06-19 DIAGNOSIS — G4733 Obstructive sleep apnea (adult) (pediatric): Secondary | ICD-10-CM | POA: Diagnosis not present

## 2022-06-19 DIAGNOSIS — I110 Hypertensive heart disease with heart failure: Secondary | ICD-10-CM | POA: Insufficient documentation

## 2022-06-19 DIAGNOSIS — I2510 Atherosclerotic heart disease of native coronary artery without angina pectoris: Secondary | ICD-10-CM | POA: Insufficient documentation

## 2022-06-19 DIAGNOSIS — Z955 Presence of coronary angioplasty implant and graft: Secondary | ICD-10-CM | POA: Diagnosis not present

## 2022-06-19 DIAGNOSIS — F419 Anxiety disorder, unspecified: Secondary | ICD-10-CM | POA: Diagnosis not present

## 2022-06-19 HISTORY — PX: BIOPSY: SHX5522

## 2022-06-19 HISTORY — PX: ESOPHAGOGASTRODUODENOSCOPY (EGD) WITH PROPOFOL: SHX5813

## 2022-06-19 HISTORY — PX: IMPACTION REMOVAL: SHX5858

## 2022-06-19 LAB — CBC WITH DIFFERENTIAL/PLATELET
Abs Immature Granulocytes: 0.01 10*3/uL (ref 0.00–0.07)
Basophils Absolute: 0 10*3/uL (ref 0.0–0.1)
Basophils Relative: 0 %
Eosinophils Absolute: 0.1 10*3/uL (ref 0.0–0.5)
Eosinophils Relative: 2 %
HCT: 43.7 % (ref 36.0–46.0)
Hemoglobin: 14.5 g/dL (ref 12.0–15.0)
Immature Granulocytes: 0 %
Lymphocytes Relative: 22 %
Lymphs Abs: 1 10*3/uL (ref 0.7–4.0)
MCH: 28.5 pg (ref 26.0–34.0)
MCHC: 33.2 g/dL (ref 30.0–36.0)
MCV: 85.9 fL (ref 80.0–100.0)
Monocytes Absolute: 0.5 10*3/uL (ref 0.1–1.0)
Monocytes Relative: 12 %
Neutro Abs: 2.9 10*3/uL (ref 1.7–7.7)
Neutrophils Relative %: 64 %
Platelets: 152 10*3/uL (ref 150–400)
RBC: 5.09 MIL/uL (ref 3.87–5.11)
RDW: 14.3 % (ref 11.5–15.5)
WBC: 4.5 10*3/uL (ref 4.0–10.5)
nRBC: 0 % (ref 0.0–0.2)

## 2022-06-19 LAB — COMPREHENSIVE METABOLIC PANEL
ALT: 16 U/L (ref 0–44)
AST: 22 U/L (ref 15–41)
Albumin: 5 g/dL (ref 3.5–5.0)
Alkaline Phosphatase: 73 U/L (ref 38–126)
Anion gap: 13 (ref 5–15)
BUN: 11 mg/dL (ref 8–23)
CO2: 24 mmol/L (ref 22–32)
Calcium: 9.4 mg/dL (ref 8.9–10.3)
Chloride: 105 mmol/L (ref 98–111)
Creatinine, Ser: 0.7 mg/dL (ref 0.44–1.00)
GFR, Estimated: >60 mL/min (ref >60)
Glucose, Bld: 108 mg/dL — ABNORMAL HIGH (ref 70–99)
Potassium: 3.8 mmol/L (ref 3.5–5.1)
Sodium: 142 mmol/L (ref 135–145)
Total Bilirubin: 0.8 mg/dL (ref 0.3–1.2)
Total Protein: 7.6 g/dL (ref 6.5–8.1)

## 2022-06-19 SURGERY — ESOPHAGOGASTRODUODENOSCOPY (EGD) WITH PROPOFOL
Anesthesia: General

## 2022-06-19 MED ORDER — SODIUM CHLORIDE 0.9 % IV SOLN: INTRAVENOUS | Status: DC

## 2022-06-19 MED ORDER — GLUCAGON HCL RDNA (DIAGNOSTIC) 1 MG IJ SOLR
1.0000 mg | Freq: Once | INTRAMUSCULAR | Status: AC
  Administered 2022-06-19: 1 mg via INTRAVENOUS
  Filled 2022-06-19: qty 1

## 2022-06-19 MED ORDER — PROPOFOL 10 MG/ML IV BOLUS
INTRAVENOUS | Status: AC
  Filled 2022-06-19: qty 20

## 2022-06-19 MED ORDER — PROPOFOL 10 MG/ML IV BOLUS
INTRAVENOUS | Status: DC | PRN
  Administered 2022-06-19: 20 mg via INTRAVENOUS
  Administered 2022-06-19: 120 mg via INTRAVENOUS

## 2022-06-19 MED ORDER — FENTANYL CITRATE (PF) 100 MCG/2ML IJ SOLN
INTRAMUSCULAR | Status: DC | PRN
  Administered 2022-06-19: 50 ug via INTRAVENOUS

## 2022-06-19 MED ORDER — LIDOCAINE 2% (20 MG/ML) 5 ML SYRINGE
INTRAMUSCULAR | Status: DC | PRN
  Administered 2022-06-19: 100 mg via INTRAVENOUS

## 2022-06-19 MED ORDER — SUCCINYLCHOLINE CHLORIDE 200 MG/10ML IV SOSY
PREFILLED_SYRINGE | INTRAVENOUS | Status: DC | PRN
  Administered 2022-06-19: 100 mg via INTRAVENOUS

## 2022-06-19 MED ORDER — PANTOPRAZOLE SODIUM 40 MG PO TBEC: 40.0000 mg | DELAYED_RELEASE_TABLET | Freq: Two times a day (BID) | ORAL | 3 refills | Status: DC

## 2022-06-19 MED ORDER — PHENYLEPHRINE 80 MCG/ML (10ML) SYRINGE FOR IV PUSH (FOR BLOOD PRESSURE SUPPORT)
PREFILLED_SYRINGE | INTRAVENOUS | Status: DC | PRN
  Administered 2022-06-19: 160 ug via INTRAVENOUS

## 2022-06-19 MED ORDER — SODIUM CHLORIDE 0.9 % IV BOLUS
1000.0000 mL | Freq: Once | INTRAVENOUS | Status: AC
  Administered 2022-06-19: 1000 mL via INTRAVENOUS

## 2022-06-19 MED ORDER — ONDANSETRON HCL 4 MG/2ML IJ SOLN
INTRAMUSCULAR | Status: DC | PRN
  Administered 2022-06-19: 4 mg via INTRAVENOUS

## 2022-06-19 MED ORDER — FENTANYL CITRATE (PF) 100 MCG/2ML IJ SOLN
INTRAMUSCULAR | Status: AC
  Filled 2022-06-19: qty 2

## 2022-06-19 MED ORDER — LACTATED RINGERS IV SOLN: INTRAVENOUS | Status: DC

## 2022-06-19 SURGICAL SUPPLY — 13 items
BLOCK BITE 60FR ADLT L/F BLUE (MISCELLANEOUS) ×2
ELECT REM PT RETURN 9FT ADLT (ELECTROSURGICAL)
FORCEP RJ3 GP 1.8X160 W-NEEDLE (CUTTING FORCEPS)
FORCEPS BIOP RAD 4 LRG CAP 4 (CUTTING FORCEPS)
NEEDLE SCLEROTHERAPY 25GX240 (NEEDLE)
PROBE APC STR FIRE (PROBE)
PROBE INJECTION GOLD (MISCELLANEOUS)
PROBE INJECTION GOLD 7FR (MISCELLANEOUS)
SNARE SHORT THROW 13M SML OVAL (MISCELLANEOUS)
SYR 50ML LL SCALE MARK (SYRINGE)
TUBING ENDO SMARTCAP PENTAX (MISCELLANEOUS) ×4
TUBING IRRIGATION ENDOGATOR (MISCELLANEOUS) ×2
WATER STERILE IRR 1000ML POUR (IV SOLUTION)

## 2022-06-19 NOTE — ED Notes (Signed)
Leaving in with daughter POV to [facility] for procedure. IV secured.

## 2022-06-19 NOTE — Transfer of Care (Signed)
Immediate Anesthesia Transfer of Care Note  Patient: Rachel Bates  Procedure(s) Performed: ESOPHAGOGASTRODUODENOSCOPY (EGD) WITH PROPOFOL BIOPSY IMPACTION REMOVAL  Patient Location: PACU  Anesthesia Type:General  Level of Consciousness: awake, alert , and oriented  Airway & Oxygen Therapy: Patient Spontanous Breathing and Patient connected to face mask oxygen  Post-op Assessment: Report given to RN, Post -op Vital signs reviewed and stable, and Patient moving all extremities X 4  Post vital signs: Reviewed and stable  Last Vitals:  Vitals Value Taken Time  BP 108/83   Temp    Pulse 88   Resp 12   SpO2 96     Last Pain:  Vitals:   06/19/22 1620  TempSrc: Temporal  PainSc: 2       Patients Stated Pain Goal: 3 (09/47/09 6283)  Complications: No notable events documented.

## 2022-06-19 NOTE — ED Triage Notes (Signed)
Pt was eating pot roast last night,felt like it didn't go down and feels like something is in her esophagus. She can hardly even tolerate own secretions.

## 2022-06-19 NOTE — ED Provider Notes (Signed)
  Physical Exam  BP (!) 148/77 (BP Location: Right Arm)   Pulse 85   Temp 98.3 F (36.8 C) (Oral)   Resp 16   SpO2 100%     Procedures  Procedures  ED Course / MDM   Clinical Course as of 06/19/22 1605  Mon Jun 19, 2022  1505 Stable GI transfer [CC]    Clinical Course User Index [CC] Tretha Sciara, MD   Medical Decision Making Amount and/or Complexity of Data Reviewed Labs: ordered.  Risk Prescription drug management.   76 year old female presenting from [place] for endoscopy due to food bolus sensation after eating pot roast.  On arrival, the patient was vitally stable, urgently taken to endoscopy with GI.       Regan Lemming, MD 06/19/22 [phone]

## 2022-06-19 NOTE — Op Note (Signed)
[facility] Patient Name: Rachel Bates Procedure Date: 06/19/2022 MRN: [id number] Attending MD: Otis Brace , MD, [id number] Date of Birth: 18-May-1946 CSN: [id number] Age: 76 Admit Type: Inpatient Procedure:                Upper GI endoscopy Indications:              Dysphagia, Foreign body in the esophagus Providers:                Otis Brace, MD, Orvil Feil, RN, Benetta Spar, Technician Referring MD:              Medicines:                Sedation Administered by an Anesthesia Professional Complications:            Tear at proximal esophagus at food impaction site.                            superficial. Estimated Blood Loss:     Estimated blood loss was minimal. Procedure:                Pre-Anesthesia Assessment:                           - Prior to the procedure, a History and Physical                            was performed, and patient medications and                            allergies were reviewed. The patient's tolerance of                            previous anesthesia was also reviewed. The risks                            and benefits of the procedure and the sedation                            options and risks were discussed with the patient.                            All questions were answered, and informed consent                            was obtained. Prior Anticoagulants: The patient has                            taken Eliquis (apixaban), last dose was 2 days                            prior to procedure. ASA Grade Assessment: III - A  patient with severe systemic disease. After                            reviewing the risks and benefits, the patient was                            deemed in satisfactory condition to undergo the                            procedure.                           After obtaining informed consent, the endoscope was                             passed under direct vision. Throughout the                            procedure, the patient's blood pressure, pulse, and                            oxygen saturations were monitored continuously. The                            [id number]-H190 ([id number]) Olympus endoscope was introduced                            through the mouth, and advanced to the second part                            of duodenum. The upper GI endoscopy was                            accomplished without difficulty. The patient                            tolerated the procedure well. Scope In: Scope Out: Findings:      Food was found in the proximal esophagus. Removal was accomplished with       gentle push with the scope.      Two benign-appearing, intrinsic moderate (circumferential scarring or       stenosis; an endoscope may pass) stenoses were found in the proximal and       distal esophagus. The stenoses were traversed.      Scattered mild inflammation characterized by congestion (edema) and       erythema was found in the gastric antrum and in the prepyloric region of       the stomach. Biopsies were taken with a cold forceps for histology.      The cardia and gastric fundus were normal on retroflexion.      Few non-bleeding superficial duodenal ulcers with no stigmata of       bleeding were found in the duodenal bulb. The largest lesion was 3 mm in       largest dimension.      The second portion of the duodenum was normal. Impression:               -  Food in the proximal esophagus. Removal was                            successful.                           - Benign-appearing esophageal stenoses.                           - Gastritis. Biopsied.                           - Non-bleeding duodenal ulcers with no stigmata of                            bleeding.                           - Normal second portion of the duodenum. Moderate Sedation:      Moderate (conscious) sedation was personally administered by an        anesthesia professional. The following parameters were monitored: oxygen       saturation, heart rate, blood pressure, and response to care. Recommendation:           - Patient has a contact number available for                            emergencies. The signs and symptoms of potential                            delayed complications were discussed with the                            patient. Return to normal activities tomorrow.                            Written discharge instructions were provided to the                            patient.                           - Soft diet.                           - Continue present medications.                           - Await pathology results.                           - Repeat upper endoscopy in 3 months for dilation .                           - Use Protonix (pantoprazole) 40 mg PO BID. Procedure Code(s):        --- Professional ---  52841, Esophagogastroduodenoscopy, flexible,                            transoral; with removal of foreign body(s)                           43239, Esophagogastroduodenoscopy, flexible,                            transoral; with biopsy, single or multiple Diagnosis Code(s):        --- Professional ---                           L24.401U, Food in esophagus causing other injury,                            initial encounter                           K22.2, Esophageal obstruction                           K29.70, Gastritis, unspecified, without bleeding                           K26.9, Duodenal ulcer, unspecified as acute or                            chronic, without hemorrhage or perforation                           R13.10, Dysphagia, unspecified                           T18.108A, Unspecified foreign body in esophagus                            causing other injury, initial encounter CPT copyright 2022 American [employer] Association. All rights reserved. The codes documented in this  report are preliminary and upon coder review may  be revised to meet current compliance requirements. Otis Brace, MD Otis Brace, MD 06/19/2022 5:06:32 PM Number of Addenda: 0

## 2022-06-19 NOTE — ED Notes (Signed)
Pt in room in ED 15 at this time. Pt is being transferred to endo, will return shortly.

## 2022-06-19 NOTE — Anesthesia Postprocedure Evaluation (Signed)
Anesthesia Post Note  Patient: Rachel Bates  Procedure(s) Performed: ESOPHAGOGASTRODUODENOSCOPY (EGD) WITH PROPOFOL BIOPSY IMPACTION REMOVAL     Patient location during evaluation: PACU Anesthesia Type: General Level of consciousness: awake and alert Pain management: pain level controlled Vital Signs Assessment: post-procedure vital signs reviewed and stable Respiratory status: spontaneous breathing, nonlabored ventilation and respiratory function stable Cardiovascular status: blood pressure returned to baseline Postop Assessment: no apparent nausea or vomiting Anesthetic complications: no   No notable events documented.  Last Vitals:  Vitals:   06/19/22 1704 06/19/22 1715  BP: 108/83 (!) 159/91  Pulse: 86 96  Resp: 13 12  Temp: (!) 36.1 C   SpO2: 93% 97%    Last Pain:  Vitals:   06/19/22 1620  TempSrc: Temporal  PainSc: 2                  Marthenia Rolling

## 2022-06-19 NOTE — ED Triage Notes (Signed)
Pt is on eliquis 

## 2022-06-19 NOTE — Consult Note (Signed)
Referring Provider:  ED Primary Care Physician:  Janine Limbo, PA-C Primary Gastroenterologist: Althia Forts  Reason for Consultation: Food impaction  HPI: Rachel Bates is a 76 y.o. female with past medical history of atrial fibrillation currently on Eliquis, last dose 2 days ago, history of intermittent trouble swallowing, history of DVT and other medical conditions listed below presented to [place] emergency department with trouble swallowing.  Started after 5 PM last night when she was eating pot roast.  Since then she is not able to eat or drink or swallow her own secretions.  Has been having intermittent trouble swallowing for many years but it usually clears on its own.  Denies any abdominal pain, diarrhea or constipation.  Denies any blood in the stool or black stool.  Past Medical History:  Diagnosis Date   AICD (automatic cardioverter/defibrillator) present dual   [employer] ---  original placedment 2007/  generator change 2014 by dr gregg taylor   Anemia    Anticoagulant long-term use    eliquis   Anxiety    Arthralgia of multiple joints    Arthritis pain    Benign hypertensive heart disease    CAD (coronary artery disease) primary cardiologist-- dr gregg taylor   MI and 2 stents 1995 in [place] [facility])    CHF NYHA class II, chronic, systolic ([place])    followed by dr Carleene Overlie taylor   Degenerative disc disease, lumbar    Depression    Dyslipidemia    History of basal cell carcinoma (BCC) excision    right ankle area s/p  excision in office 06/ 2019  in office   History of DVT of lower extremity yrs ago before 2012   History of MI (myocardial infarction) 1995  in [place]   History of pulmonary embolus (PE) 2012   History of ventricular tachycardia    Hyperlipidemia    Hypersomnia    Ischemic cardiomyopathy    Migraines    OA (osteoarthritis)    "all over"   Obstructive apnea    PAF (paroxysmal atrial fibrillation) ([place])    Primary localized osteoarthritis of  right hip 12/18/2017   Primary localized osteoarthritis of right knee 05/28/2018   S/P coronary artery stent placement 1995   in [facility]   05-21-2018 per pt x2  stents in same coronary artery (unsure BM or DES)   Vitamin D deficiency disease     Past Surgical History:  Procedure Laterality Date   CARDIAC DEFIBRILLATOR PLACEMENT  2007   CATARACT EXTRACTION, BILATERAL     COLONOSCOPY  04/14/2013   colonic polyp, status post polypectomy. Mild panocolonic diverticulosis. Small internal hemorrhoids   DILATION AND CURETTAGE OF UTERUS  yrs ago   IMPLANTABLE CARDIOVERTER DEFIBRILLATOR GENERATOR CHANGE N/A 02/04/2013   Procedure: IMPLANTABLE CARDIOVERTER DEFIBRILLATOR GENERATOR CHANGE;  Surgeon: Evans Lance, MD;  Location[facility] CATH LAB;  Service: Cardiovascular;  Laterality: N/A;   SHOULDER ARTHROSCOPY Right 2015   TOTAL HIP ARTHROPLASTY Right 12/18/2017   Procedure: RIGHT TOTAL HIP ARTHROPLASTY;  Surgeon: Marchia Bond, MD;  Location: [place];  Service: Orthopedics;  Laterality: Right;   TOTAL HIP ARTHROPLASTY Left 04/19/2021   Procedure: TOTAL HIP ARTHROPLASTY;  Surgeon: Marchia Bond, MD;  Location: [facility];  Service: Orthopedics;  Laterality: Left;   TOTAL KNEE ARTHROPLASTY Right 05/28/2018   Procedure: TOTAL KNEE ARTHROPLASTY;  Surgeon: Marchia Bond, MD;  Location: [facility];  Service: Orthopedics;  Laterality: Right;  Adductor Block   TOTAL KNEE ARTHROPLASTY Left 08/12/2021  Procedure: TOTAL KNEE ARTHROPLASTY;  Surgeon: Marchia Bond, MD;  Location: [facility];  Service: Orthopedics;  Laterality: Left;   TUBAL LIGATION Bilateral yrs ago   WISDOM TOOTH EXTRACTION      Prior to Admission medications   Medication Sig Start Date End Date Taking? Authorizing Provider  apixaban (ELIQUIS) 5 MG TABS tablet TAKE 1 TABLET BY MOUTH TWICE A DAY 08/24/21   Evans Lance, MD  b complex vitamins capsule Take 1 capsule by mouth daily.    [provider]  Calcium Carb-Cholecalciferol (CALCIUM 600 + D PO)  Take 1 tablet by mouth daily.    [provider]  carvedilol (COREG) 12.5 MG tablet Take 12.5 mg by mouth 2 (two) times daily with a meal.     [provider]  Cholecalciferol (DIALYVITE VITAMIN D 5000) 125 MCG (5000 UT) capsule Take 5,000 Units by mouth daily.    [provider]  Cyanocobalamin (B-12 PO) Take 1 tablet by mouth daily.    [provider]  diphenhydrAMINE (BENADRYL) 25 mg capsule Take 25 mg by mouth daily.    [provider]  FIBER PO Take 1 capsule by mouth at bedtime.    [provider]  Fiber POWD Take 1 Scoop by mouth daily.    [provider]  furosemide (LASIX) 40 MG tablet Take 40 mg by mouth daily as needed for edema.    [provider]  gabapentin (NEURONTIN) 300 MG capsule Take 300 mg by mouth 3 (three) times daily. 04/09/20   [provider]  HYDROcodone-acetaminophen (NORCO) 10-325 MG tablet Take 1 tablet by mouth every 6 (six) hours as needed. 08/12/21   Ventura Bruns, PA-C  Neomycin-Bacitracin-Polymyxin (TRIPLE ANTIBIOTIC) OINT Apply 1 application topically daily as needed (wound care).    [provider]  Omega-3 Fatty Acids (FISH OIL) 1000 MG CAPS Take 1,000 mg by mouth daily.     [provider]  ramipril (ALTACE) 2.5 MG tablet Take 2.5 mg by mouth daily.      [provider]  simvastatin (ZOCOR) 40 MG tablet Take 40 mg by mouth at bedtime.      [provider]  traZODone (DESYREL) 50 MG tablet Take 50 mg by mouth at bedtime.     [provider]  venlafaxine (EFFEXOR) 75 MG tablet Take 75 mg by mouth daily. 03/06/21   [provider]  vitamin E 400 UNIT capsule Take 400 Units by mouth daily.    [provider]  zonisamide (ZONEGRAN) 50 MG capsule Take 50-100 mg by mouth See admin instructions. Take '50mg'$  by mouth in the morning and '100mg'$  at night. 09/13/17   [provider]    Scheduled Meds: Continuous Infusions:   sodium chloride     lactated ringers 10 mL/hr at 06/19/22 1624   PRN Meds:.  Allergies as of 06/19/2022 - Review Complete 06/19/2022  Allergen Reaction Noted   Sumatriptan succinate Other (See Comments) 09/27/2015   Amitriptyline Other (See Comments) 09/27/2015    Family History  Problem Relation Age of Onset   Hypertension Mother    Thyroid disease Mother    Alzheimer's disease Mother    Coronary artery disease Father    Pulmonary embolism Father    Congestive Heart Failure Maternal Grandmother    Hypertension Maternal Grandmother    Heart attack Maternal Grandfather    Other Maternal Grandfather        carotid disease   Dementia Paternal Grandmother    Other  Paternal Grandfather 25       accident    Social History   Socioeconomic History   Marital status: Single    Spouse name: Not on file   Number of children: Not on file   Years of education: Not on file   Highest education level: Not on file  Occupational History   Not on file  Tobacco Use   Smoking status: Former    Years: 30.00    Types: Cigarettes    Quit date: 05/21/1996    Years since quitting: 26.0   Smokeless tobacco: Never  Vaping Use   Vaping Use: Never used  Substance and Sexual Activity   Alcohol use: No   Drug use: Never   Sexual activity: Not on file  Other Topics Concern   Not on file  Social History Narrative   Not on file   Social Determinants of Health   Financial Resource Strain: Low Risk  (05/28/2018)   Overall Financial Resource Strain (CARDIA)    Difficulty of Paying Living Expenses: Not hard at all  Food Insecurity: No Food Insecurity (05/28/2018)   Hunger Vital Sign    Worried About Running Out of Food in the Last Year: Never true    [place] in the Last Year: Never true  Transportation Needs: No Transportation Needs (05/28/2018)   PRAPARE - Hydrologist (Medical): No    Lack of Transportation (Non-Medical): No  Physical Activity: Not  on file  Stress: Not on file  Social Connections: Not on file  Intimate Partner Violence: Not on file    Review of Systems: All negative except as stated above in HPI.  Physical Exam: Vital signs: Vitals:   06/19/22 1556 06/19/22 1620  BP: (!) 148/77 (!) 159/90  Pulse: 85 82  Resp: 16 12  Temp: 98.3 F (36.8 C) (!) 97.3 F (36.3 C)  SpO2: 100% 95%     General:   Alert,  Well-developed, well-nourished, pleasant and cooperative in NAD Lungs:  Clear throughout to auscultation.   No wheezes, crackles, or rhonchi. No acute distress. Heart:  Regular rate and rhythm; no murmurs, clicks, rubs,  or gallops. Abdomen: Soft, nontender, nondistended, bowel sounds present, no peritoneal signs Rectal:  Deferred  GI:  Lab Results: Recent Labs    06/19/22 1403  WBC 4.5  HGB 14.5  HCT 43.7  PLT 152   BMET Recent Labs    06/19/22 1403  NA 142  K 3.8  CL 105  CO2 24  GLUCOSE 108*  BUN 11  CREATININE 0.70  CALCIUM 9.4   LFT Recent Labs    06/19/22 1403  PROT 7.6  ALBUMIN 5.0  AST 22  ALT 16  ALKPHOS 73  BILITOT 0.8   PT/INR No results for input(s): "LABPROT", "INR" in the last 72 hours.   Studies/Results: No results found.  Impression/Plan: -Food impaction -Chronic intermittent dysphagia -History of atrial fibrillation.  Eliquis on hold for last 2 days  Recommendations -------------------------- -Proceed with urgent EGD today.  Risks (bleeding, infection, bowel perforation that could require surgery, sedation-related changes in cardiopulmonary systems), benefits (identification and possible treatment of source of symptoms, exclusion of certain causes of symptoms), and alternatives (watchful waiting, radiographic imaging studies, empiric medical treatment)  were explained to patient/family in detail and patient wishes to proceed.     LOS: 0 days   Otis Brace  MD, FACP 06/19/2022, 4:37 PM  Contact #  [phone]

## 2022-06-19 NOTE — ED Provider Notes (Signed)
[facility] EMERGENCY DEPT Provider Note   CSN: [id number] Arrival date & time: 06/19/22  1241     History  Chief Complaint  Patient presents with   Dysphagia    Rachel Bates is a 76 y.o. female.  76 yo F with a chief complaints of not being able to swallow.  The patient states that she was eating pot roast last night and felt like it got stuck in her throat.  Since then she has been spitting up her own secretions.  This started about 5 PM last night.  She eventually decided to come in for evaluation.  Has a history of the same but eventually it all seemed to pass.  Has never had endoscopy as far she knows.  Has had a colonoscopy in the past but is unsure who did it.        Home Medications Prior to Admission medications   Medication Sig Start Date End Date Taking? Authorizing Provider  apixaban (ELIQUIS) 5 MG TABS tablet TAKE 1 TABLET BY MOUTH TWICE A DAY 08/24/21   Evans Lance, MD  b complex vitamins capsule Take 1 capsule by mouth daily.    [provider]  Calcium Carb-Cholecalciferol (CALCIUM 600 + D PO) Take 1 tablet by mouth daily.    [provider]  carvedilol (COREG) 12.5 MG tablet Take 12.5 mg by mouth 2 (two) times daily with a meal.     [provider]  Cholecalciferol (DIALYVITE VITAMIN D 5000) 125 MCG (5000 UT) capsule Take 5,000 Units by mouth daily.    [provider]  Cyanocobalamin (B-12 PO) Take 1 tablet by mouth daily.    [provider]  diphenhydrAMINE (BENADRYL) 25 mg capsule Take 25 mg by mouth daily.    [provider]  FIBER PO Take 1 capsule by mouth at bedtime.    [provider]  Fiber POWD Take 1 Scoop by mouth daily.    [provider]  furosemide (LASIX) 40 MG tablet Take 40 mg by mouth daily as needed for edema.    [provider]  gabapentin (NEURONTIN) 300 MG capsule Take 300 mg by mouth 3 (three) times daily. 04/09/20   [provider]   HYDROcodone-acetaminophen (NORCO) 10-325 MG tablet Take 1 tablet by mouth every 6 (six) hours as needed. 08/12/21   Ventura Bruns, PA-C  Neomycin-Bacitracin-Polymyxin (TRIPLE ANTIBIOTIC) OINT Apply 1 application topically daily as needed (wound care).    [provider]  Omega-3 Fatty Acids (FISH OIL) 1000 MG CAPS Take 1,000 mg by mouth daily.     [provider]  ramipril (ALTACE) 2.5 MG tablet Take 2.5 mg by mouth daily.      [provider]  simvastatin (ZOCOR) 40 MG tablet Take 40 mg by mouth at bedtime.      [provider]  traZODone (DESYREL) 50 MG tablet Take 50 mg by mouth at bedtime.     [provider]  venlafaxine (EFFEXOR) 75 MG tablet Take 75 mg by mouth daily. 03/06/21   [provider]  vitamin E 400 UNIT capsule Take 400 Units by mouth daily.    [provider]  zonisamide (ZONEGRAN) 50 MG capsule Take 50-100 mg by mouth See admin instructions. Take '50mg'$  by mouth in the morning and '100mg'$  at night. 09/13/17   [provider]      Allergies    Sumatriptan succinate and Amitriptyline    Review of Systems   Review of  Systems  Physical Exam Updated Vital Signs BP (!) 155/84 (BP Location: Left Arm)   Pulse 89   Temp 98.2 F (36.8 C) (Oral)   Resp 18   SpO2 97%  Physical Exam Vitals and nursing note reviewed.  Constitutional:      General: She is not in acute distress.    Appearance: She is well-developed. She is not diaphoretic.  HENT:     Head: Normocephalic and atraumatic.  Eyes:     Pupils: Pupils are equal, round, and reactive to light.  Cardiovascular:     Rate and Rhythm: Normal rate and regular rhythm.     Heart sounds: No murmur heard.    No friction rub. No gallop.  Pulmonary:     Effort: Pulmonary effort is normal.     Breath sounds: No wheezing or rales.  Abdominal:     General: There is no distension.     Palpations: Abdomen is soft.     Tenderness: There is no abdominal  tenderness.  Musculoskeletal:        General: No tenderness.     Cervical back: Normal range of motion and neck supple.  Skin:    General: Skin is warm and dry.  Neurological:     Mental Status: She is alert and oriented to person, place, and time.  Psychiatric:        Behavior: Behavior normal.     ED Results / Procedures / Treatments   Labs (all labs ordered are listed, but only abnormal results are displayed) Labs Reviewed  COMPREHENSIVE METABOLIC PANEL - Abnormal; Notable for the following components:      Result Value   Glucose, Bld 108 (*)    All other components within normal limits  CBC WITH DIFFERENTIAL/PLATELET    EKG None  Radiology No results found.  Procedures Procedures    Medications Ordered in ED Medications  sodium chloride 0.9 % bolus 1,000 mL (1,000 mLs Intravenous New Bag/Given 06/19/22 1408)  glucagon (human recombinant) (GLUCAGEN) injection 1 mg (1 mg Intravenous Given 06/19/22 1403)    ED Course/ Medical Decision Making/ A&P                           Medical Decision Making Amount and/or Complexity of Data Reviewed Labs: ordered.  Risk Prescription drug management.   76 yo F with a chief complaint of difficulty swallowing.  This started after she had got a piece of pot roast stuck in her throat.  Is actively spitting up her secretions here.  Unable to tolerate by mouth.  I discussed this with Dr. Alessandra Bevels, [facility].  Recommends ED to ED transfer and they will evaluate her for urgent endoscopy.  Discussed this with Dr. Zenia Resides excepts in ED to ED transfer.  I tried a bolus of glucagon without improvement here.  No anemia no significant electrolyte abnormality.  The patients results and plan were reviewed and discussed.   Any x-rays performed were independently reviewed by myself.   Differential diagnosis were considered with the presenting HPI.  Medications  sodium chloride 0.9 % bolus 1,000 mL (1,000 mLs Intravenous New Bag/Given  06/19/22 1408)  glucagon (human recombinant) (GLUCAGEN) injection 1 mg (1 mg Intravenous Given 06/19/22 1403)    Vitals:   06/19/22 1307  BP: (!) 155/84  Pulse: 89  Resp: 18  Temp: 98.2 F (36.8 C)  TempSrc: Oral  SpO2: 97%    Final diagnoses:  Food impaction of esophagus,  initial encounter            Final Clinical Impression(s) / ED Diagnoses Final diagnoses:  Food impaction of esophagus, initial encounter    Rx / DC Orders ED Discharge Orders     None         Deno Etienne, DO 06/19/22 1459

## 2022-06-19 NOTE — Anesthesia Procedure Notes (Signed)
Procedure Name: Intubation Date/Time: 06/19/2022 4:47 PM  Performed by: Niel Hummer, CRNAPre-anesthesia Checklist: Patient identified, Emergency Drugs available, Suction available and Patient being monitored Patient Re-evaluated:Patient Re-evaluated prior to induction Oxygen Delivery Method: Circle system utilized Preoxygenation: Pre-oxygenation with 100% oxygen Induction Type: IV induction, Rapid sequence and Cricoid Pressure applied Laryngoscope Size: Mac and 4 Grade View: Grade I Tube type: Oral Tube size: 7.0 mm Number of attempts: 1 Airway Equipment and Method: Stylet Placement Confirmation: ETT inserted through vocal cords under direct vision, positive ETCO2 and breath sounds checked- equal and bilateral Secured at: 23 cm Tube secured with: Tape Dental Injury: Teeth and Oropharynx as per pre-operative assessment

## 2022-06-19 NOTE — Anesthesia Preprocedure Evaluation (Addendum)
Anesthesia Evaluation  Patient identified by MRN, date of birth, ID band Patient awake    Reviewed: Allergy & Precautions, NPO status , Patient's Chart, lab work & pertinent test results, reviewed documented beta blocker date and time   History of Anesthesia Complications Negative for: history of anesthetic complications  Airway Mallampati: II  TM Distance: >3 FB Neck ROM: Full  Mouth opening: Limited Mouth Opening  Dental  (+) Edentulous Lower, Edentulous Upper   Pulmonary sleep apnea , former smoker   Pulmonary exam normal        Cardiovascular hypertension, Pt. on medications and Pt. on home beta blockers + CAD, + Past MI, + Cardiac Stents (1995) and +CHF  Normal cardiovascular exam+ dysrhythmias (on Eliquis) Atrial Fibrillation + pacemaker + Cardiac Defibrillator (Notes indicate placement for hx of VT, no echo results in Epic)      Neuro/Psych  Headaches  Anxiety Depression       GI/Hepatic Neg liver ROS,,,Food impaction   Endo/Other  negative endocrine ROS    Renal/GU negative Renal ROS  negative genitourinary   Musculoskeletal  (+) Arthritis ,    Abdominal   Peds  Hematology negative hematology ROS (+)   Anesthesia Other Findings Day of surgery medications reviewed with patient.  Reproductive/Obstetrics negative OB ROS                             Anesthesia Physical Anesthesia Plan  ASA: 3 and emergent  Anesthesia Plan: General   Post-op Pain Management: Minimal or no pain anticipated   Induction: Intravenous, Rapid sequence and Cricoid pressure planned  PONV Risk Score and Plan: 3 and Treatment may vary due to age or medical condition, Ondansetron and Dexamethasone  Airway Management Planned: Oral ETT  Additional Equipment: None  Intra-op Plan:   Post-operative Plan: Extubation in OR  Informed Consent: I have reviewed the patients History and Physical, chart, labs  and discussed the procedure including the risks, benefits and alternatives for the proposed anesthesia with the patient or authorized representative who has indicated his/her understanding and acceptance.     Dental advisory given  Plan Discussed with: CRNA  Anesthesia Plan Comments:        Anesthesia Quick Evaluation

## 2022-06-21 LAB — SURGICAL PATHOLOGY

## 2022-06-22 ENCOUNTER — Encounter (HOSPITAL_COMMUNITY): Payer: Self-pay | Admitting: Gastroenterology

## 2022-06-23 DIAGNOSIS — Z6827 Body mass index (BMI) 27.0-27.9, adult: Secondary | ICD-10-CM | POA: Diagnosis not present

## 2022-06-23 DIAGNOSIS — K219 Gastro-esophageal reflux disease without esophagitis: Secondary | ICD-10-CM | POA: Diagnosis not present

## 2022-06-23 DIAGNOSIS — J029 Acute pharyngitis, unspecified: Secondary | ICD-10-CM | POA: Diagnosis not present

## 2022-07-05 NOTE — Progress Notes (Signed)
Remote ICD transmission.   

## 2022-07-12 DIAGNOSIS — R059 Cough, unspecified: Secondary | ICD-10-CM | POA: Diagnosis not present

## 2022-07-12 DIAGNOSIS — B349 Viral infection, unspecified: Secondary | ICD-10-CM | POA: Diagnosis not present

## 2022-07-12 DIAGNOSIS — Z6827 Body mass index (BMI) 27.0-27.9, adult: Secondary | ICD-10-CM | POA: Diagnosis not present

## 2022-07-31 DIAGNOSIS — J312 Chronic pharyngitis: Secondary | ICD-10-CM | POA: Diagnosis not present

## 2022-07-31 DIAGNOSIS — J358 Other chronic diseases of tonsils and adenoids: Secondary | ICD-10-CM | POA: Diagnosis not present

## 2022-08-03 ENCOUNTER — Other Ambulatory Visit: Payer: Self-pay | Admitting: Physician Assistant

## 2022-08-03 DIAGNOSIS — J312 Chronic pharyngitis: Secondary | ICD-10-CM

## 2022-08-03 DIAGNOSIS — J358 Other chronic diseases of tonsils and adenoids: Secondary | ICD-10-CM

## 2022-08-04 DIAGNOSIS — E782 Mixed hyperlipidemia: Secondary | ICD-10-CM | POA: Diagnosis not present

## 2022-08-04 DIAGNOSIS — G629 Polyneuropathy, unspecified: Secondary | ICD-10-CM | POA: Diagnosis not present

## 2022-08-04 DIAGNOSIS — I2510 Atherosclerotic heart disease of native coronary artery without angina pectoris: Secondary | ICD-10-CM | POA: Diagnosis not present

## 2022-08-04 DIAGNOSIS — F339 Major depressive disorder, recurrent, unspecified: Secondary | ICD-10-CM | POA: Diagnosis not present

## 2022-08-04 DIAGNOSIS — Z79899 Other long term (current) drug therapy: Secondary | ICD-10-CM | POA: Diagnosis not present

## 2022-08-04 DIAGNOSIS — Z6826 Body mass index (BMI) 26.0-26.9, adult: Secondary | ICD-10-CM | POA: Diagnosis not present

## 2022-08-04 DIAGNOSIS — J312 Chronic pharyngitis: Secondary | ICD-10-CM | POA: Diagnosis not present

## 2022-08-09 ENCOUNTER — Ambulatory Visit
Admission: RE | Admit: 2022-08-09 | Discharge: 2022-08-09 | Disposition: A | Discharge status: Home / Self Care | Payer: Medicare HMO | Source: Ambulatory Visit | Attending: Physician Assistant | Admitting: Physician Assistant

## 2022-08-09 DIAGNOSIS — J312 Chronic pharyngitis: Secondary | ICD-10-CM

## 2022-08-09 DIAGNOSIS — M47812 Spondylosis without myelopathy or radiculopathy, cervical region: Secondary | ICD-10-CM | POA: Diagnosis not present

## 2022-08-09 DIAGNOSIS — R590 Localized enlarged lymph nodes: Secondary | ICD-10-CM | POA: Diagnosis not present

## 2022-08-09 DIAGNOSIS — J392 Other diseases of pharynx: Secondary | ICD-10-CM | POA: Diagnosis not present

## 2022-08-09 DIAGNOSIS — J358 Other chronic diseases of tonsils and adenoids: Secondary | ICD-10-CM

## 2022-08-09 DIAGNOSIS — E041 Nontoxic single thyroid nodule: Secondary | ICD-10-CM | POA: Diagnosis not present

## 2022-08-09 MED ORDER — IOPAMIDOL (ISOVUE-300) INJECTION 61%
100.0000 mL | Freq: Once | INTRAVENOUS | Status: AC | PRN
  Administered 2022-08-09: 100 mL via INTRAVENOUS

## 2022-08-10 ENCOUNTER — Encounter: Payer: Self-pay | Admitting: General Practice

## 2022-08-10 ENCOUNTER — Encounter (HOSPITAL_COMMUNITY): Payer: Self-pay | Admitting: Physician Assistant

## 2022-08-10 ENCOUNTER — Other Ambulatory Visit (HOSPITAL_COMMUNITY): Payer: Self-pay | Admitting: Physician Assistant

## 2022-08-10 DIAGNOSIS — J358 Other chronic diseases of tonsils and adenoids: Secondary | ICD-10-CM

## 2022-08-10 DIAGNOSIS — C799 Secondary malignant neoplasm of unspecified site: Secondary | ICD-10-CM

## 2022-08-10 DIAGNOSIS — G5603 Carpal tunnel syndrome, bilateral upper limbs: Secondary | ICD-10-CM | POA: Diagnosis not present

## 2022-08-10 NOTE — Progress Notes (Signed)
Markus Daft, MD  Allen Kell, NT US guided core biopsy of a neck lymph node.  See CT neck 08/09/22.  [place]       Previous Messages    ----- Message ----- From: Allen Kell, NT Sent: 08/10/2022   4:08 PM EST To: Ir Procedure Requests Subject: STAT US CORE BIOPSY (LYMPH NODES)              Procedure: STAT US CORE BIOPSY (LYMPH NODES)  Reason: Lymph Node possible metastatic disease. Left tongue base/tonsillar mass  History: CT in chart  Provider: Jolene Provost, PA-C  Contact: [phone]

## 2022-08-11 ENCOUNTER — Other Ambulatory Visit (HOSPITAL_COMMUNITY): Payer: Self-pay | Admitting: Student

## 2022-08-13 ENCOUNTER — Other Ambulatory Visit: Payer: Self-pay | Admitting: Radiology

## 2022-08-14 ENCOUNTER — Ambulatory Visit (HOSPITAL_COMMUNITY)
Admission: RE | Admit: 2022-08-14 | Discharge: 2022-08-14 | Disposition: A | Discharge status: Home / Self Care | Payer: Medicare HMO | Source: Ambulatory Visit | Attending: Physician Assistant | Admitting: Physician Assistant

## 2022-08-14 ENCOUNTER — Other Ambulatory Visit: Payer: Self-pay

## 2022-08-14 DIAGNOSIS — Z955 Presence of coronary angioplasty implant and graft: Secondary | ICD-10-CM | POA: Diagnosis not present

## 2022-08-14 DIAGNOSIS — R590 Localized enlarged lymph nodes: Secondary | ICD-10-CM | POA: Diagnosis not present

## 2022-08-14 DIAGNOSIS — Z95810 Presence of automatic (implantable) cardiac defibrillator: Secondary | ICD-10-CM | POA: Diagnosis not present

## 2022-08-14 DIAGNOSIS — C799 Secondary malignant neoplasm of unspecified site: Secondary | ICD-10-CM

## 2022-08-14 DIAGNOSIS — J358 Other chronic diseases of tonsils and adenoids: Secondary | ICD-10-CM | POA: Diagnosis not present

## 2022-08-14 DIAGNOSIS — I480 Paroxysmal atrial fibrillation: Secondary | ICD-10-CM | POA: Diagnosis not present

## 2022-08-14 DIAGNOSIS — J392 Other diseases of pharynx: Secondary | ICD-10-CM | POA: Diagnosis not present

## 2022-08-14 DIAGNOSIS — I2510 Atherosclerotic heart disease of native coronary artery without angina pectoris: Secondary | ICD-10-CM | POA: Diagnosis not present

## 2022-08-14 DIAGNOSIS — R599 Enlarged lymph nodes, unspecified: Secondary | ICD-10-CM | POA: Diagnosis not present

## 2022-08-14 DIAGNOSIS — C8511 Unspecified B-cell lymphoma, lymph nodes of head, face, and neck: Secondary | ICD-10-CM | POA: Diagnosis not present

## 2022-08-14 DIAGNOSIS — J312 Chronic pharyngitis: Secondary | ICD-10-CM | POA: Diagnosis not present

## 2022-08-14 DIAGNOSIS — E785 Hyperlipidemia, unspecified: Secondary | ICD-10-CM | POA: Diagnosis not present

## 2022-08-14 DIAGNOSIS — J029 Acute pharyngitis, unspecified: Secondary | ICD-10-CM | POA: Diagnosis not present

## 2022-08-14 MED ORDER — MIDAZOLAM HCL 2 MG/2ML IJ SOLN
INTRAMUSCULAR | Status: AC
  Filled 2022-08-14: qty 2

## 2022-08-14 MED ORDER — FENTANYL CITRATE (PF) 100 MCG/2ML IJ SOLN
INTRAMUSCULAR | Status: AC | PRN
  Administered 2022-08-14: 50 ug via INTRAVENOUS

## 2022-08-14 MED ORDER — MIDAZOLAM HCL 2 MG/2ML IJ SOLN
INTRAMUSCULAR | Status: AC | PRN
  Administered 2022-08-14: .5 mg via INTRAVENOUS
  Administered 2022-08-14: 1 mg via INTRAVENOUS

## 2022-08-14 MED ORDER — FENTANYL CITRATE (PF) 100 MCG/2ML IJ SOLN
INTRAMUSCULAR | Status: AC
  Filled 2022-08-14: qty 2

## 2022-08-14 MED ORDER — LIDOCAINE HCL (PF) 1 % IJ SOLN
8.0000 mL | Freq: Once | INTRAMUSCULAR | Status: AC
  Administered 2022-08-14: 8 mL via INTRADERMAL

## 2022-08-14 MED ORDER — SODIUM CHLORIDE 0.9 % IV SOLN: INTRAVENOUS | Status: DC

## 2022-08-14 NOTE — Progress Notes (Signed)
Pt denies pain. VSS. No bleeding noted at biopsy site.  Discharge instructions given to pt and daughter verbally and in writing.  Both verbalize understanding and deny further questions

## 2022-08-14 NOTE — Procedures (Signed)
Interventional Radiology Procedure Note  Date of Procedure: 08/14/2022  Procedure: US guided core needle biopsy   Findings:  1. US guided core needle biopsy of enlarged right cervical node 18ga x6 cores    Complications: No immediate complications noted.   Estimated Blood Loss: minimal  Follow-up and Recommendations: 1. Bedrest 1 hour    Albin Felling, MD  Vascular & Interventional Radiology  08/14/2022 2:50 PM

## 2022-08-14 NOTE — H&P (Signed)
Chief Complaint: Bilateral cervical lymph node adenopathy. Request if for lymph node biopsy  Referring Physician(s): Nordbladh,Louise  Supervising Physician: Juliet Rude  Patient Status: [facility] - Out-pt  History of Present Illness: Rachel Bates is a 78 y.o. female outpatient. History of  MI, DVT, PE, HLD, CAD s/p stent placement, defibrillator, basal cell carcinoma, PAF. Found to have lymph node adenopathy to her neck while being worked up for sore throat. CT soft tissue neck from 1.31.24 reads   Extensive pathologic adenopathy in the neck bilaterally with index nodes described above. Differential considerations include metastatic adenopathy versus lymphoproliferative disorder. Team is requesting lymph node biopsy.   Patient alert and laying in bed,calm. Endorses sore throat Denies any fevers, headache, chest pain, SOB, cough, abdominal pain, nausea, vomiting or bleeding. Return precautions and treatment recommendations and follow-up discussed with the patient  who is agreeable with the plan.    Past Medical History:  Diagnosis Date   AICD (automatic cardioverter/defibrillator) present dual   [employer] ---  original placedment 2007/  generator change 2014 by dr gregg taylor   Anemia    Anticoagulant long-term use    eliquis   Anxiety    Arthralgia of multiple joints    Arthritis pain    Benign hypertensive heart disease    CAD (coronary artery disease) primary cardiologist-- dr gregg taylor   MI and 2 stents 1995 in [facility])    CHF NYHA class II, chronic, systolic ([place])    followed by dr Carleene Overlie taylor   Degenerative disc disease, lumbar    Depression    Dyslipidemia    History of basal cell carcinoma (BCC) excision    right ankle area s/p  excision in office 06/ 2019  in office   History of DVT of lower extremity yrs ago before 2012   History of MI (myocardial infarction) 1995  in [place]   History of pulmonary embolus (PE) 2012   History of  ventricular tachycardia    Hyperlipidemia    Hypersomnia    Ischemic cardiomyopathy    Migraines    OA (osteoarthritis)    "all over"   Obstructive apnea    PAF (paroxysmal atrial fibrillation) ([place])    Primary localized osteoarthritis of right hip 12/18/2017   Primary localized osteoarthritis of right knee 05/28/2018   S/P coronary artery stent placement 1995   in [facility]   05-21-2018 per pt x2  stents in same coronary artery (unsure BM or DES)   Vitamin D deficiency disease     Past Surgical History:  Procedure Laterality Date   BIOPSY  06/19/2022   Procedure: BIOPSY;  Surgeon: Otis Brace, MD;  Location: [facility];  Service: Gastroenterology;;   CARDIAC DEFIBRILLATOR PLACEMENT  2007   CATARACT EXTRACTION, BILATERAL     COLONOSCOPY  04/14/2013   colonic polyp, status post polypectomy. Mild panocolonic diverticulosis. Small internal hemorrhoids   DILATION AND CURETTAGE OF UTERUS  yrs ago   ESOPHAGOGASTRODUODENOSCOPY (EGD) WITH PROPOFOL N/A 06/19/2022   Procedure: ESOPHAGOGASTRODUODENOSCOPY (EGD) WITH PROPOFOL;  Surgeon: Otis Brace, MD;  Location: [facility];  Service: Gastroenterology;  Laterality: N/A;   IMPACTION REMOVAL  06/19/2022   Procedure: IMPACTION REMOVAL;  Surgeon: Otis Brace, MD;  Location: [facility];  Service: Gastroenterology;;   IMPLANTABLE CARDIOVERTER DEFIBRILLATOR GENERATOR CHANGE N/A 02/04/2013   Procedure: IMPLANTABLE CARDIOVERTER DEFIBRILLATOR GENERATOR CHANGE;  Surgeon: Evans Lance, MD;  Location: [facility] CATH LAB;  Service: Cardiovascular;  Laterality: N/A;   SHOULDER ARTHROSCOPY  Right 2015   TOTAL HIP ARTHROPLASTY Right 12/18/2017   Procedure: RIGHT TOTAL HIP ARTHROPLASTY;  Surgeon: Marchia Bond, MD;  Location: [facility];  Service: Orthopedics;  Laterality: Right;   TOTAL HIP ARTHROPLASTY Left 04/19/2021   Procedure: TOTAL HIP ARTHROPLASTY;  Surgeon: Marchia Bond, MD;  Location: [facility];  Service: Orthopedics;  Laterality: Left;   TOTAL  KNEE ARTHROPLASTY Right 05/28/2018   Procedure: TOTAL KNEE ARTHROPLASTY;  Surgeon: Marchia Bond, MD;  Location: [facility];  Service: Orthopedics;  Laterality: Right;  Adductor Block   TOTAL KNEE ARTHROPLASTY Left 08/12/2021   Procedure: TOTAL KNEE ARTHROPLASTY;  Surgeon: Marchia Bond, MD;  Location: [facility];  Service: Orthopedics;  Laterality: Left;   TUBAL LIGATION Bilateral yrs ago   WISDOM TOOTH EXTRACTION      Allergies: Sumatriptan succinate and Amitriptyline  Medications: Prior to Admission medications   Medication Sig Start Date End Date Taking? Authorizing Provider  b complex vitamins capsule Take 1 capsule by mouth daily.   Yes [provider]  Calcium Carb-Cholecalciferol (CALCIUM 600 + D PO) Take 1 tablet by mouth daily.   Yes [provider]  carvedilol (COREG) 12.5 MG tablet Take 12.5 mg by mouth 2 (two) times daily with a meal.    Yes [provider]  Cholecalciferol (DIALYVITE VITAMIN D 5000) 125 MCG (5000 UT) capsule Take 5,000 Units by mouth daily.   Yes [provider]  Cyanocobalamin (B-12 PO) Take 1 tablet by mouth daily.   Yes [provider]  FIBER PO Take 1 capsule by mouth at bedtime.   Yes [provider]  Fiber POWD Take 1 Scoop by mouth daily.   Yes [provider]  gabapentin (NEURONTIN) 300 MG capsule Take 300 mg by mouth 3 (three) times daily. 04/09/20  Yes [provider]  Omega-3 Fatty Acids (FISH OIL) 1000 MG CAPS Take 1,000 mg by mouth daily.    Yes [provider]  pantoprazole (PROTONIX) 40 MG tablet Take 1 tablet (40 mg total) by mouth 2 (two) times daily. 06/19/22 06/19/23 Yes Brahmbhatt, Parag, MD  ramipril (ALTACE) 2.5 MG tablet Take 2.5 mg by mouth daily.     Yes [provider]  simvastatin (ZOCOR) 40 MG tablet Take 40 mg by mouth at bedtime.     Yes [provider]  traZODone (DESYREL) 50 MG tablet Take 50 mg by mouth at bedtime.    Yes [provider]  venlafaxine (EFFEXOR) 75 MG tablet Take 75 mg by mouth daily. 03/06/21  Yes [provider]  vitamin E 400 UNIT capsule Take 400 Units by mouth daily.   Yes [provider]  zonisamide (ZONEGRAN) 50 MG capsule Take 50-100 mg by mouth See admin instructions. Take '50mg'$  by mouth in the morning and '100mg'$  at night. 09/13/17  Yes [provider]  apixaban (ELIQUIS) 5 MG TABS tablet TAKE 1 TABLET BY MOUTH TWICE A DAY 08/24/21   Evans Lance, MD  diphenhydrAMINE (BENADRYL) 25 mg capsule Take 25 mg by mouth daily.    [provider]  furosemide (LASIX) 40 MG tablet Take 40 mg by mouth daily as needed for edema.    [provider]  HYDROcodone-acetaminophen (NORCO) 10-325 MG tablet Take 1 tablet by mouth every 6 (six) hours as needed. 08/12/21   Ventura Bruns, PA-C  Neomycin-Bacitracin-Polymyxin (TRIPLE ANTIBIOTIC) OINT Apply 1 application topically daily as needed (wound care).    [provider]     Family History  Problem  Relation Age of Onset   Hypertension Mother    Thyroid disease Mother    Alzheimer's disease Mother    Coronary artery disease Father    Pulmonary embolism Father    Congestive Heart Failure Maternal Grandmother    Hypertension Maternal Grandmother    Heart attack Maternal Grandfather    Other Maternal Grandfather        carotid disease   Dementia Paternal Grandmother    Other Paternal Grandfather 19       accident    Social History   Socioeconomic History   Marital status: Single    Spouse name: Not on file   Number of children: Not on file   Years of education: Not on file   Highest education level: Not on file  Occupational History   Not on file  Tobacco Use   Smoking status: Former    Years: 30.00    Types: Cigarettes    Quit date: 05/21/1996    Years since quitting: 26.2   Smokeless tobacco: Never  Vaping Use   Vaping Use: Never used  Substance and Sexual Activity   Alcohol use: No    Drug use: Never   Sexual activity: Not on file  Other Topics Concern   Not on file  Social History Narrative   Not on file   Social Determinants of Health   Financial Resource Strain: Low Risk  (05/28/2018)   Overall Financial Resource Strain (CARDIA)    Difficulty of Paying Living Expenses: Not hard at all  Food Insecurity: No Food Insecurity (05/28/2018)   Hunger Vital Sign    Worried About Running Out of Food in the Last Year: Never true    Ran Out of Food in the Last Year: Never true  Transportation Needs: No Transportation Needs (05/28/2018)   PRAPARE - Hydrologist (Medical): No    Lack of Transportation (Non-Medical): No  Physical Activity: Not on file  Stress: Not on file  Social Connections: Not on file    Review of Systems: A 12 point ROS discussed and pertinent positives are indicated in the HPI above.  All other systems are negative.  Review of Systems  Constitutional:  Negative for fatigue and fever.  HENT:  Positive for sore throat. Negative for congestion.   Respiratory:  Negative for cough and shortness of breath.   Gastrointestinal:  Negative for abdominal pain, diarrhea, nausea and vomiting.    Vital Signs: BP (!) 120/58   Pulse 74   Temp 98.3 F (36.8 C) (Oral)   Resp 16   Ht '5\' 7"'$  (1.702 m)   Wt 170 lb (77.1 kg)   SpO2 93%   BMI 26.63 kg/m    Physical Exam Vitals and nursing note reviewed.  Constitutional:      Appearance: She is well-developed.  HENT:     Head: Normocephalic and atraumatic.  Eyes:     Conjunctiva/sclera: Conjunctivae normal.  Cardiovascular:     Rate and Rhythm: Normal rate and regular rhythm.     Heart sounds: Normal heart sounds.  Pulmonary:     Effort: Pulmonary effort is normal.     Breath sounds: Normal breath sounds.  Musculoskeletal:        General: Normal range of motion.     Cervical back: Normal range of motion.  Skin:    General: Skin is warm.  Neurological:     Mental  Status: She is alert and oriented to person, place, and time.  Imaging: CT SOFT TISSUE NECK W CONTRAST  Result Date: 08/09/2022 CLINICAL DATA:  Chronic sore throat.  Tonsillar mass. EXAM: CT NECK WITH CONTRAST TECHNIQUE: Multidetector CT imaging of the neck was performed using the standard protocol following the bolus administration of intravenous contrast. RADIATION DOSE REDUCTION: This exam was performed according to the departmental dose-optimization program which includes automated exposure control, adjustment of the mA and/or kV according to patient size and/or use of iterative reconstruction technique. CONTRAST:  15m ISOVUE-300 IOPAMIDOL (ISOVUE-300) INJECTION 61% COMPARISON:  None Available. FINDINGS: Pharynx and larynx: Mildly prominent mucosal soft tissue thickening at the tongue base, left eccentric. Salivary glands: No inflammation, mass, or stone. Thyroid: Approximately 1.3 cm right thyroid nodule which does not require further imaging follow-up. (Ref: J Am Coll Radiol. 2015 Feb;12(2): 143-50). Lymph nodes: Extensive pathologically enlarged lymph nodes throughout the neck bilaterally with abnormal rounded morphology and heterogeneity. Index nodes include conglomerate right level II lymph node which measures approximately 3.2 x 2.4 cm. This node as well as other nodes in the region result in severe narrowing of the right internal jugular vein, which remains patent. More inferiorly on the right there is an index 2.1 x 1.7 cm right level II/III node which also compresses the right internal jugular vein. Slightly more inferiorly there is a 1 cm right level III node on series 2, image 64. Index left level II node measures 1.7 x 1.5 cm on series 2, image 51. More inferiorly there are 2 level III nodes which measure 1.7 and 1.4 cm on series 2, image 68. Many smaller lymph nodes in the neck bilaterally, which are concerning due to morphology/appearance. Vascular: Limited assessment due to non  arterial timing. Major arteries appear grossly patent in the neck. Right internal jugular vein is severely narrowed, as described above. Limited intracranial: Negative. Visualized orbits: Negative. Mastoids and visualized paranasal sinuses: Clear. Skeleton: Severe multilevel degenerative change in the cervical spine. Upper chest: There is of probable scarring in the lung apices. No confluent consolidation. Residual contrast within the imaged esophagus. IMPRESSION: 1. 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 Differential considerations include metastatic adenopathy versus lymphoproliferative disorder. 2. Mildly prominent mucosal soft tissue thickening at the tongue base and vallecula, left eccentric. This could be physiologic but given the above findings recommend correlation with direct inspection to exclude malignancy (including primary versus lymphoproliferative disorder). These results will be called to the ordering clinician or representative by the Radiologist Assistant, and communication documented in the PACS or [employer] Electronically Signed   By: FMargaretha SheffieldM.D.   On: 08/09/2022 14:18    Labs:  CBC: Recent Labs    06/19/22 1403  WBC 4.5  HGB 14.5  HCT 43.7  PLT 152    COAGS: No results for input(s): "INR", "APTT" in the last 8760 hours.  BMP: Recent Labs    06/19/22 1403  NA 142  K 3.8  CL 105  CO2 24  GLUCOSE 108*  BUN 11  CALCIUM 9.4  CREATININE 0.70  GFRNONAA >60    LIVER FUNCTION TESTS: Recent Labs    06/19/22 1403  BILITOT 0.8  AST 22  ALT 16  ALKPHOS 73  PROT 7.6  ALBUMIN 5.0     Assessment and Plan:  77y.o. female outpatient. History of  MI, DVT, PE, HLD, CAD s/p stent placement, basal cell carcinoma, defibrillator, PAF. Found to have lymph node adenopathy to her neck while being worked up for sore throat. CT soft  tissue neck from 1.31.24 reads   Extensive pathologic adenopathy in the neck  bilaterally with index nodes described above. Differential considerations include metastatic adenopathy versus lymphoproliferative disorder. Team is requesting lymph node biopsy.   No recent labs. Patient is on eliquis. No pertinent allergies. Patient has been NPO since midnight.  Risks and benefits of cervical lymph node biopsy was discussed with the patient and/or patient's family including, but not limited to bleeding, infection, damage to adjacent structures or low yield requiring additional tests.  All of the questions were answered and there is agreement to proceed.  Consent signed and in chart.   Thank you for this interesting consult.  I greatly enjoyed meeting Rachel Bates and look forward to participating in their care.  A copy of this report was sent to the requesting provider on this date.  Electronically Signed: Jacqualine Mau, NP 08/14/2022, 1:04 PM   I spent a total of  30 Minutes   in face to face in clinical consultation, greater than 50% of which was counseling/coordinating care for cervical lymph node biopsy

## 2022-08-15 ENCOUNTER — Encounter: Payer: Self-pay | Admitting: General Practice

## 2022-08-15 NOTE — Progress Notes (Signed)
Evans Lance, MD  Allen Kell, NT Ms .Danner is well known to me and may hold her Eliquis for up to 72 hours if needed. Restart Eliquis when bleeding risk is acceptable. GT       Previous Messages    ----- Message ----- From: Allen Kell, NT Sent: 08/11/2022   9:10 AM EST To: Evans Lance, MD Subject: Biopsy                                        Good Morning The above pt is scheduled for a STAT US Biopsy on Monday 2/5 and will need to hold her Eliquis for 48 hrs prior to this procedure. Can you please give permission for pt to hold this medication.  Thanks [employer]

## 2022-08-17 LAB — SURGICAL PATHOLOGY

## 2022-08-20 ENCOUNTER — Other Ambulatory Visit: Payer: Self-pay | Admitting: Internal Medicine

## 2022-08-20 DIAGNOSIS — I480 Paroxysmal atrial fibrillation: Secondary | ICD-10-CM

## 2022-08-21 ENCOUNTER — Telehealth: Payer: Self-pay | Admitting: Hematology

## 2022-08-21 ENCOUNTER — Other Ambulatory Visit (HOSPITAL_COMMUNITY): Payer: Self-pay | Admitting: Otolaryngology

## 2022-08-21 DIAGNOSIS — C8331 Diffuse large B-cell lymphoma, lymph nodes of head, face, and neck: Secondary | ICD-10-CM

## 2022-08-21 NOTE — Telephone Encounter (Signed)
Scheduled appt per 2/9 referral. Pt is aware of appt date and time. Pt is aware to arrive 15 mins prior to appt time and to bring and updated insurance card. Pt is aware of appt location.

## 2022-08-21 NOTE — Telephone Encounter (Signed)
Prescription refill request for Eliquis received. Indication: Afib  Last office visit: 11/31/22 Rachel Bates)  Scr: 0.70 (06/19/22)  Age: 77 Weight: 77.1kg  Pt overdue to see provider. Pt has scheduled appt with Dr Rachel Bates on 08/24/22. Appropriate dose. Refill sent.

## 2022-08-24 ENCOUNTER — Ambulatory Visit: Payer: Medicare HMO | Attending: Internal Medicine | Admitting: Internal Medicine

## 2022-08-24 ENCOUNTER — Encounter: Payer: Self-pay | Admitting: Internal Medicine

## 2022-08-24 VITALS — BP 136/80 | HR 92 | Ht 67.0 in | Wt 166.4 lb

## 2022-08-24 DIAGNOSIS — I5022 Chronic systolic (congestive) heart failure: Secondary | ICD-10-CM | POA: Diagnosis not present

## 2022-08-24 NOTE — Patient Instructions (Signed)
Medication Instructions:  Your physician recommends that you continue on your current medications as directed. Please refer to the Current Medication list given to you today.  *If you need a refill on your cardiac medications before your next appointment, please call your pharmacy*   Lab Work: None ordered   Testing/Procedures: None ordered   Follow-Up: At [facility], you and your health needs are our priority.  As part of our continuing mission to provide you with exceptional heart care, we have created designated Provider Care Teams.  These Care Teams include your primary Cardiologist (physician) and Advanced Practice Providers (APPs -  Physician Assistants and Nurse Practitioners) who all work together to provide you with the care you need, when you need it.  Remote monitoring is used to monitor your Pacemaker or ICD from home. This monitoring reduces the number of office visits required to check your device to one time per year. It allows [place] to keep an eye on the functioning of your device to ensure it is working properly. You are scheduled for a device check from home on 09/05/22, 12/05/22. You may send your transmission at any time that day. If you have a wireless device, the transmission will be sent automatically. After your physician reviews your transmission, you will receive a postcard with your next transmission date.  Your next appointment:   6 month(s)  The format for your next appointment:   In Person  Provider:   Cristopher Peru, MD    Thank you for choosing [facility]!!   [phone]

## 2022-08-24 NOTE — Progress Notes (Signed)
HPI Mrs. Strang returns today for followup. She is a pleasant 77 yo woman with an ICM, PAF, s/p ICD insertion. She has done well in the interim with no chest pain or sob. No syncope. No ICD therapies.  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 She is approaching ERI in the coming 6-8 months. She is pending chemo and XRT on her lymphoma.  Allergies  Allergen Reactions   Sumatriptan Succinate Other (See Comments)    Chest pain, no triptans, pt states "it makes my heart race"   Amitriptyline Other (See Comments)    Weight gain     Current Outpatient Medications  Medication Sig Dispense Refill   apixaban (ELIQUIS) 5 MG TABS tablet TAKE 1 TABLET BY MOUTH TWICE A DAY 60 tablet 5   b complex vitamins capsule Take 1 capsule by mouth daily.     Calcium Carb-Cholecalciferol (CALCIUM 600 + D PO) Take 1 tablet by mouth daily.     carvedilol (COREG) 12.5 MG tablet Take 12.5 mg by mouth 2 (two) times daily with a meal.      Cholecalciferol (DIALYVITE VITAMIN D 5000) 125 MCG (5000 UT) capsule Take 5,000 Units by mouth daily.     Cyanocobalamin (B-12 PO) Take 1 tablet by mouth daily.     diphenhydrAMINE (BENADRYL) 25 mg capsule Take 25 mg by mouth daily.     FIBER PO Take 1 capsule by mouth at bedtime.     Fiber POWD Take 1 Scoop by mouth daily.     furosemide (LASIX) 40 MG tablet Take 40 mg by mouth daily as needed for edema.     gabapentin (NEURONTIN) 300 MG capsule Take 300 mg by mouth 3 (three) times daily.     Neomycin-Bacitracin-Polymyxin (TRIPLE ANTIBIOTIC) OINT Apply 1 application topically daily as needed (wound care).     Omega-3 Fatty Acids (FISH OIL) 1000 MG CAPS Take 1,000 mg by mouth daily.      pantoprazole (PROTONIX) 40 MG tablet Take 1 tablet (40 mg total) by mouth 2 (two) times daily. 180 tablet 3   ramipril (ALTACE) 2.5 MG tablet Take 2.5 mg by mouth daily.       simvastatin (ZOCOR) 40 MG tablet Take 40 mg by mouth at  bedtime.       traZODone (DESYREL) 50 MG tablet Take 50 mg by mouth at bedtime.      venlafaxine (EFFEXOR) 75 MG tablet Take 75 mg by mouth daily.     vitamin E 400 UNIT capsule Take 400 Units by mouth daily.     zonisamide (ZONEGRAN) 50 MG capsule Take 50-100 mg by mouth See admin instructions. Take 74m by mouth in the morning and 1065mat night.     No current facility-administered medications for this visit.     Past Medical History:  Diagnosis Date   AICD (automatic cardioverter/defibrillator) present dual   Medtronic ---  original placedment 2007/  generator change 2014 by dr Jehu Mccauslin   Anemia    Anticoagulant long-term use    eliquis   Anxiety    Arthralgia of multiple joints    Arthritis pain    Benign hypertensive heart disease    CAD (coronary artery disease) primary cardiologist-- dr Hager [place]   [place] [facility]   [place] II[place]   [place] dr grCarleene Overlieaylor  Degenerative disc disease, lumbar    Depression    Dyslipidemia    History of basal cell carcinoma (BCC) excision    right ankle area s/p  excision in office 06/ 2019  in office   History of DVT of lower extremity yrs ago before 2012   History of MI (myocardial infarction) 1995  in [place]   History of pulmonary embolus (PE) 2012   History of ventricular tachycardia    Hyperlipidemia    Hypersomnia    Ischemic cardiomyopathy    Migraines    OA (osteoarthritis)    "all over"   Obstructive apnea    PAF (paroxysmal atrial fibrillation) (HCC)    Primary localized osteoarthritis of right hip 12/18/2017   Primary localized osteoarthritis of right knee 05/28/2018   S/P coronary artery stent placement 1995   in [facility]   05-21-2018 per pt x2  stents in same coronary artery (unsure BM or DES)   Vitamin D deficiency disease     ROS:   All systems reviewed and negative except as noted in the HPI.   Past Surgical History:  Procedure Laterality  Date   BIOPSY  06/19/2022   Procedure: BIOPSY;  Surgeon: Otis Brace, MD;  Location: [facility];  Service: Gastroenterology;;   CARDIAC DEFIBRILLATOR PLACEMENT  2007   CATARACT EXTRACTION, BILATERAL     COLONOSCOPY  04/14/2013   colonic polyp, status post polypectomy. Mild panocolonic diverticulosis. Small internal hemorrhoids   DILATION AND CURETTAGE OF UTERUS  yrs ago   ESOPHAGOGASTRODUODENOSCOPY (EGD) WITH PROPOFOL N/A 06/19/2022   Procedure: ESOPHAGOGASTRODUODENOSCOPY (EGD) WITH PROPOFOL;  Surgeon: Otis Brace, MD;  Location: [facility];  Service: Gastroenterology;  Laterality: N/A;   IMPACTION REMOVAL  06/19/2022   Procedure: IMPACTION REMOVAL;  Surgeon: Otis Brace, MD;  Location: [facility];  Service: Gastroenterology;;   IMPLANTABLE CARDIOVERTER DEFIBRILLATOR GENERATOR CHANGE N/A 02/04/2013   Procedure: IMPLANTABLE CARDIOVERTER DEFIBRILLATOR GENERATOR CHANGE;  Surgeon: Evans Lance, MD;  Location[facility];  Service: Cardiovascular;  Laterality: N/A;   SHOULDER ARTHROSCOPY Right 2015   TOTAL HIP ARTHROPLASTY Right 12/18/2017   Procedure: RIGHT TOTAL HIP ARTHROPLASTY;  Surgeon: Marchia Bond, MD;  Location: [place];  Service: Orthopedics;  Laterality: Right;   TOTAL HIP ARTHROPLASTY Left 04/19/2021   Procedure: TOTAL HIP ARTHROPLASTY;  Surgeon: Marchia Bond, MD;  Location: [facility];  Service: Orthopedics;  Laterality: Left;   TOTAL KNEE ARTHROPLASTY Right 05/28/2018   Procedure: TOTAL KNEE ARTHROPLASTY;  Surgeon: Marchia Bond, MD;  Location: [facility];  Service: Orthopedics;  Laterality: Right;  Adductor Block   TOTAL KNEE ARTHROPLASTY Left 08/12/2021   Procedure: TOTAL KNEE ARTHROPLASTY;  Surgeon: Marchia Bond, MD;  Location: [facility];  Service: Orthopedics;  Laterality: Left;   TUBAL LIGATION Bilateral yrs ago   34 TOOTH EXTRACTION       Family History  Problem Relation Age of Onset   Hypertension Mother    Thyroid disease Mother    Alzheimer's  disease Mother    Coronary artery disease Father    Pulmonary embolism Father    Congestive Heart Failure Maternal Grandmother    Hypertension Maternal Grandmother    Heart attack Maternal Grandfather    Other Maternal Grandfather        carotid disease   Dementia Paternal Grandmother    Other Paternal Grandfather 5       accident     Social History   Socioeconomic History   Marital status: Single    Spouse name: Not on  file   Number of children: Not on file   Years of education: Not on file   Highest education level: Not on file  Occupational History   Not on file  Tobacco Use   Smoking status: Former    Years: 30.00    Types: Cigarettes    Quit date: 05/21/1996    Years since quitting: 26.2   Smokeless tobacco: Never  Vaping Use   Vaping Use: Never used  Substance and Sexual Activity   Alcohol use: No   Drug use: Never   Sexual activity: Not on file  Other Topics Concern   Not on file  Social History Narrative   Not on file   Social Determinants of Health   Financial Resource Strain: Low Risk  (05/28/2018)   Overall Financial Resource Strain (CARDIA)    Difficulty of Paying Living Expenses: Not hard at all  Food Insecurity: No Food Insecurity (05/28/2018)   Hunger Vital Sign    Worried About Running Out of Food in the Last Year: Never true    [place] in the Last Year: Never true  Transportation Needs: No Transportation Needs (05/28/2018)   PRAPARE - Hydrologist (Medical): No    Lack of Transportation (Non-Medical): No  Physical Activity: Not on file  Stress: Not on file  Social Connections: Not on file  Intimate Partner Violence: Not on file     BP 136/80   Pulse 92   Ht 5' 7"$  (1.702 m)   Wt 166 lb 6.4 oz (75.5 kg)   SpO2 97%   BMI 26.06 kg/m   Physical Exam:  Well appearing NAD HEENT: Unremarkable Neck:  No JVD, no thyromegally Lymphatics:  No adenopathy Back:  No CVA tenderness Lungs:  Clear HEART:   Regular rate rhythm, no murmurs, no rubs, no clicks Abd:  soft, positive bowel sounds, no organomegally, no rebound, no guarding Ext:  2 plus pulses, no edema, no cyanosis, no clubbing Skin:  No rashes no nodules Neuro:  CN II through XII intact, motor grossly intact  EKG - nsr with atrial pacing and old inferior MI  DEVICE  Normal device function.  See PaceArt for details. Approaching ERI.  Assess/Plan  1. ICM - she denies anginal symptoms. She will continue her current meds. 2. ICD - her medtronic PPM is working normally. 3. VT - she has not had any recurrent sustained VT since her last visit. 4. PAF - she is remaining in NSR. She will continue her eliquis.   Carleene Overlie Traevon Meiring,MD

## 2022-08-31 ENCOUNTER — Inpatient Hospital Stay: Payer: Medicare HMO | Attending: Hematology | Admitting: Hematology

## 2022-08-31 ENCOUNTER — Other Ambulatory Visit: Payer: Self-pay

## 2022-08-31 ENCOUNTER — Inpatient Hospital Stay: Payer: Medicare HMO

## 2022-08-31 VITALS — BP 143/61 | HR 86 | Temp 97.7°F | Resp 18 | Wt 170.5 lb

## 2022-08-31 DIAGNOSIS — Z96649 Presence of unspecified artificial hip joint: Secondary | ICD-10-CM | POA: Insufficient documentation

## 2022-08-31 DIAGNOSIS — I5022 Chronic systolic (congestive) heart failure: Secondary | ICD-10-CM | POA: Diagnosis not present

## 2022-08-31 DIAGNOSIS — C8511 Unspecified B-cell lymphoma, lymph nodes of head, face, and neck: Secondary | ICD-10-CM

## 2022-08-31 DIAGNOSIS — R599 Enlarged lymph nodes, unspecified: Secondary | ICD-10-CM | POA: Diagnosis not present

## 2022-08-31 DIAGNOSIS — Z950 Presence of cardiac pacemaker: Secondary | ICD-10-CM | POA: Diagnosis not present

## 2022-08-31 DIAGNOSIS — J329 Chronic sinusitis, unspecified: Secondary | ICD-10-CM | POA: Diagnosis not present

## 2022-08-31 DIAGNOSIS — Z85828 Personal history of other malignant neoplasm of skin: Secondary | ICD-10-CM | POA: Diagnosis not present

## 2022-08-31 DIAGNOSIS — I4891 Unspecified atrial fibrillation: Secondary | ICD-10-CM | POA: Insufficient documentation

## 2022-08-31 DIAGNOSIS — Z801 Family history of malignant neoplasm of trachea, bronchus and lung: Secondary | ICD-10-CM | POA: Insufficient documentation

## 2022-08-31 DIAGNOSIS — J392 Other diseases of pharynx: Secondary | ICD-10-CM | POA: Diagnosis not present

## 2022-08-31 DIAGNOSIS — Z96659 Presence of unspecified artificial knee joint: Secondary | ICD-10-CM | POA: Diagnosis not present

## 2022-08-31 DIAGNOSIS — Z87891 Personal history of nicotine dependence: Secondary | ICD-10-CM | POA: Insufficient documentation

## 2022-08-31 LAB — LACTATE DEHYDROGENASE: LDH: 163 U/L (ref 98–192)

## 2022-08-31 LAB — CBC WITH DIFFERENTIAL (CANCER CENTER ONLY)
Abs Immature Granulocytes: 0.01 10*3/uL (ref 0.00–0.07)
Basophils Absolute: 0 10*3/uL (ref 0.0–0.1)
Basophils Relative: 0 %
Eosinophils Absolute: 0.1 10*3/uL (ref 0.0–0.5)
Eosinophils Relative: 2 %
HCT: 38.2 % (ref 36.0–46.0)
Hemoglobin: 12.6 g/dL (ref 12.0–15.0)
Immature Granulocytes: 0 %
Lymphocytes Relative: 22 %
Lymphs Abs: 0.9 10*3/uL (ref 0.7–4.0)
MCH: 27.6 pg (ref 26.0–34.0)
MCHC: 33 g/dL (ref 30.0–36.0)
MCV: 83.6 fL (ref 80.0–100.0)
Monocytes Absolute: 0.4 10*3/uL (ref 0.1–1.0)
Monocytes Relative: 11 %
Neutro Abs: 2.6 10*3/uL (ref 1.7–7.7)
Neutrophils Relative %: 65 %
Platelet Count: 125 10*3/uL — ABNORMAL LOW (ref 150–400)
RBC: 4.57 MIL/uL (ref 3.87–5.11)
RDW: 15.3 % (ref 11.5–15.5)
Smear Review: NORMAL
WBC Count: 4 10*3/uL (ref 4.0–10.5)
nRBC: 0 % (ref 0.0–0.2)

## 2022-08-31 LAB — CMP (CANCER CENTER ONLY)
ALT: 11 U/L (ref 0–44)
AST: 17 U/L (ref 15–41)
Albumin: 4 g/dL (ref 3.5–5.0)
Alkaline Phosphatase: 72 U/L (ref 38–126)
Anion gap: 5 (ref 5–15)
BUN: 10 mg/dL (ref 8–23)
CO2: 28 mmol/L (ref 22–32)
Calcium: 8.6 mg/dL — ABNORMAL LOW (ref 8.9–10.3)
Chloride: 108 mmol/L (ref 98–111)
Creatinine: 0.71 mg/dL (ref 0.44–1.00)
GFR, Estimated: >60 mL/min (ref >60)
Glucose, Bld: 114 mg/dL — ABNORMAL HIGH (ref 70–99)
Potassium: 3.9 mmol/L (ref 3.5–5.1)
Sodium: 141 mmol/L (ref 135–145)
Total Bilirubin: 0.5 mg/dL (ref 0.3–1.2)
Total Protein: 6.4 g/dL — ABNORMAL LOW (ref 6.5–8.1)

## 2022-08-31 LAB — SAMPLE TO BLOOD BANK

## 2022-08-31 LAB — HEPATITIS B SURFACE ANTIGEN: Hepatitis B Surface Ag: NONREACTIVE

## 2022-08-31 LAB — ABO/RH: ABO/RH(D): A POS

## 2022-08-31 LAB — HEPATITIS B CORE ANTIBODY, TOTAL: Hep B Core Total Ab: NONREACTIVE

## 2022-08-31 LAB — SURGICAL PATHOLOGY

## 2022-08-31 LAB — HEPATITIS C ANTIBODY: HCV Ab: NONREACTIVE

## 2022-08-31 LAB — HIV ANTIBODY (ROUTINE TESTING W REFLEX): HIV Screen 4th Generation wRfx: NONREACTIVE

## 2022-08-31 NOTE — Progress Notes (Signed)
HEMATOLOGY/ONCOLOGY CONSULTATION NOTE  Date of Service: 08/31/2022  Patient Care Team: Janine Limbo, PA-C as PCP - General (Internal Medicine) Evans Lance, MD as PCP - Cardiology (Cardiology) Charlsie Merles, MD (Family Medicine)  CHIEF COMPLAINTS/PURPOSE OF CONSULTATION:  Evaluation of non hodgkin's lymphoma, cervical lymphadenopathy   HISTORY OF PRESENTING ILLNESS:   Rachel Bates is a wonderful 77 y.o. female who has been referred to [place] by Maryan Rued, DO for evaluation and management of non hodgkin's lymphoma. She has a hx of chronic systolic heart failure.  Nasolaryngoscopy revealed a mass lesion of the left oropharynx, emanating from the left base of tongue/glossotonsillar sulcus. Patient subsequently had a CT neck with contrast performed on 08/09/2022, which reported extensive bilateral adenopaty, with greatest lymph node conglomerate measuring 3.2 x 2.4 cm in the right level 2. Patient underwent core needle biopsy on 08/18/22 and endorsed persistent throat pain and odynophagia at the time. She is a former smoker and quit in the late 90s. SHe endorsed heavy tobacco use prior to quitting, with 1.5 pack/day for about 30 years.   Today, she is accompanied by her daughter. She complains of a sore throat beginning around Thanksgiving time. She was given antibiotics and prednisone which did not improve symptoms. She endorses swelling in her neck with associated soreness which came quickly past 2-3 weeks. She reports that her neck edema has recently grown on both the left and right sides. Her neck pain is in a specific area. She also complains of recent her neck pain if she turns her neck.  She did present Emergency EMT because something was stuck in her throat. Endoscopy revealed that it was food and it was pushed down. An ulcer was also found in her esophagus and some narrowing which will be stretched in three months. She has not had any issues with swallowing food since  then. She has not had a biopsy of issue on the back of her tongue. No other lumps/bumps, unexplained fevers, chills, night sweat, change in breathing, abdominal pain, or skin rashes. She reports discomfort with drinking tea which caused her to eat less as well.   She is allergic to Sumatriptan and another medication she cannot recall. She does not consume alcohol and has quit smoking in the 90s. 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 She believes the sore was caused by shoe discomfort.  She denies any facial puffiness, abdominal pain, recent change in bowel habits or urination. She reports that her defibrillator has never gone off.   She reports a ganglion cyst on her right knee, 2 knee replacements, and 2 hip replacements. She denies a Fhx of blood disorders. However, her sister has lung cancer and was a frequent smoker.  She is UTD with her vaccinations, incuding influenza, RSV, and COVID-19 booster. She has endorsed migraines since childhood. She lives on her own is able to complete daily activities independently. She will follow-up with Dr. Fredric Dine soon. She follows up with cardiologist regularly. Her a fib, pacemaker, and ICD device have been stable. She has not had a recent ECHO.   MEDICAL HISTORY:  Past Medical History:  Diagnosis Date   AICD (automatic cardioverter/defibrillator) present dual   [employer]tr[employer] ---  original placedment 2007/  generator change 2014 by dr gregg taylor   Anemia    Anticoagulant long-term use    eliquis   Anxiety    Arthralgia  of multiple joints    Arthritis pain    Benign hypertensive heart disease    CAD (coronary artery disease) primary cardiologist-- dr gregg taylor   MI and 2 stents 1995 [place] [facility]    CHF NYHA class II, chronic, systolic ([place])    followed by dr Carleene Overlie taylor   Degenerative disc disease, lumbar    Depression     Dyslipidemia    History of basal cell carcinoma (BCC) excision    right ankle area s/p  excision in office 06/ 2019  in office   History of DVT of lower extremity yrs ago before 2012   History of MI (myocardial infarction) 1995  in [place]   History of pulmonary embolus (PE) 2012   History of ventricular tachycardia    Hyperlipidemia    Hypersomnia    Ischemic cardiomyopathy    Migraines    OA (osteoarthritis)    "all over"   Obstructive apnea    PAF (paroxysmal atrial fibrillation) (HCC)    Primary localized osteoarthritis of right hip 12/18/2017   Primary localized osteoarthritis of right knee 05/28/2018   S/P coronary artery stent placement 1995   in [facility]   05-21-2018 per pt x2  stents in same coronary artery (unsure BM or DES)   Vitamin D deficiency disease     SURGICAL HISTORY: Past Surgical History:  Procedure Laterality Date   BIOPSY  06/19/2022   Procedure: BIOPSY;  Surgeon: Otis Brace, MD;  Location: [facility];  Service: Gastroenterology;;   CARDIAC DEFIBRILLATOR PLACEMENT  2007   CATARACT EXTRACTION, BILATERAL     COLONOSCOPY  04/14/2013   colonic polyp, status post polypectomy. Mild panocolonic diverticulosis. Small internal hemorrhoids   DILATION AND CURETTAGE OF UTERUS  yrs ago   ESOPHAGOGASTRODUODENOSCOPY (EGD) WITH PROPOFOL N/A 06/19/2022   Procedure: ESOPHAGOGASTRODUODENOSCOPY (EGD) WITH PROPOFOL;  Surgeon: Otis Brace, MD;  Location: [facility];  Service: Gastroenterology;  Laterality: N/A;   IMPACTION REMOVAL  06/19/2022   Procedure: IMPACTION REMOVAL;  Surgeon: Otis Brace, MD;  Location: [facility];  Service: Gastroenterology;;   IMPLANTABLE CARDIOVERTER DEFIBRILLATOR GENERATOR CHANGE N/A 02/04/2013   Procedure: IMPLANTABLE CARDIOVERTER DEFIBRILLATOR GENERATOR CHANGE;  Surgeon: Evans Lance, MD;  [facility] CATH LAB;  Service: Cardiovascular;  Laterality: N/A;   SHOULDER ARTHROSCOPY Right 2015   TOTAL HIP ARTHROPLASTY Right 12/18/2017    Procedure: RIGHT TOTAL HIP ARTHROPLASTY;  Surgeon: Marchia Bond, MD;  Location: [place];  Service: Orthopedics;  Laterality: Right;   TOTAL HIP ARTHROPLASTY Left 04/19/2021   Procedure: TOTAL HIP ARTHROPLASTY;  Surgeon: Marchia Bond, MD;  Location: [facility];  Service: Orthopedics;  Laterality: Left;   TOTAL KNEE ARTHROPLASTY Right 05/28/2018   Procedure: TOTAL KNEE ARTHROPLASTY;  Surgeon: Marchia Bond, MD;  Location: [facility];  Service: Orthopedics;  Laterality: Right;  Adductor Block   TOTAL KNEE ARTHROPLASTY Left 08/12/2021   Procedure: TOTAL KNEE ARTHROPLASTY;  Surgeon: Marchia Bond, MD;  Location: [facility];  Service: Orthopedics;  Laterality: Left;   TUBAL LIGATION Bilateral yrs ago   WISDOM TOOTH EXTRACTION      SOCIAL HISTORY: Social History   Socioeconomic History   Marital status: Single    Spouse name: Not on file   Number of children: Not on file   Years of education: Not on file   Highest education level: Not on file  Occupational History   Not on file  Tobacco Use   Smoking status: Former    Years: 30.00  Types: Cigarettes    Quit date: 05/21/1996    Years since quitting: 26.2   Smokeless tobacco: Never  Vaping Use   Vaping Use: Never used  Substance and Sexual Activity   Alcohol use: No   Drug use: Never   Sexual activity: Not on file  Other Topics Concern   Not on file  Social History Narrative   Not on file   Social Determinants of Health   Financial Resource Strain: Low Risk  (05/28/2018)   Overall Financial Resource Strain (CARDIA)    Difficulty of Paying Living Expenses: Not hard at all  Food Insecurity: No Food Insecurity (05/28/2018)   Hunger Vital Sign    Worried About Running Out of Food in the Last Year: Never true    Ran Out of Food in the Last Year: Never true  Transportation Needs: No Transportation Needs (05/28/2018)   PRAPARE - Hydrologist (Medical): No    Lack of Transportation (Non-Medical): No  Physical  Activity: Not on file  Stress: Not on file  Social Connections: Not on file  Intimate Partner Violence: Not on file    FAMILY HISTORY: Family History  Problem Relation Age of Onset   Hypertension Mother    Thyroid disease Mother    Alzheimer's disease Mother    Coronary artery disease Father    Pulmonary embolism Father    Congestive Heart Failure Maternal Grandmother    Hypertension Maternal Grandmother    Heart attack Maternal Grandfather    Other Maternal Grandfather        carotid disease   Dementia Paternal Grandmother    Other Paternal Grandfather 38       accident    ALLERGIES:  is allergic to sumatriptan succinate and amitriptyline.  MEDICATIONS:  Current Outpatient Medications  Medication Sig Dispense Refill   apixaban (ELIQUIS) 5 MG TABS tablet TAKE 1 TABLET BY MOUTH TWICE A DAY 60 tablet 5   b complex vitamins capsule Take 1 capsule by mouth daily.     Calcium Carb-Cholecalciferol (CALCIUM 600 + D PO) Take 1 tablet by mouth daily.     carvedilol (COREG) 12.5 MG tablet Take 12.5 mg by mouth 2 (two) times daily with a meal.      Cholecalciferol (DIALYVITE VITAMIN D 5000) 125 MCG (5000 UT) capsule Take 5,000 Units by mouth daily.     Cyanocobalamin (B-12 PO) Take 1 tablet by mouth daily.     FIBER PO Take 1 capsule by mouth at bedtime.     Fiber POWD Take 1 Scoop by mouth daily.     furosemide (LASIX) 40 MG tablet Take 40 mg by mouth daily as needed for edema.     gabapentin (NEURONTIN) 300 MG capsule Take 300 mg by mouth 3 (three) times daily.     loratadine (CLARITIN) 10 MG tablet Take 10 mg by mouth daily.     Neomycin-Bacitracin-Polymyxin (TRIPLE ANTIBIOTIC) OINT Apply 1 application topically daily as needed (wound care).     Omega-3 Fatty Acids (FISH OIL) 1000 MG CAPS Take 1,000 mg by mouth daily.      pantoprazole (PROTONIX) 40 MG tablet Take 1 tablet (40 mg total) by mouth 2 (two) times daily. 180 tablet 3   ramipril (ALTACE) 2.5 MG tablet Take 2.5 mg by  mouth daily.       simvastatin (ZOCOR) 40 MG tablet Take 40 mg by mouth at bedtime.       traZODone (DESYREL) 50 MG tablet Take  50 mg by mouth at bedtime.      venlafaxine (EFFEXOR) 75 MG tablet Take 75 mg by mouth daily.     vitamin E 400 UNIT capsule Take 400 Units by mouth daily.     zonisamide (ZONEGRAN) 50 MG capsule Take 50-100 mg by mouth See admin instructions. Take '50mg'$  by mouth in the morning and '100mg'$  at night.     diphenhydrAMINE (BENADRYL) 25 mg capsule Take 25 mg by mouth daily. (Patient not taking: Reported on 08/31/2022)     No current facility-administered medications for this visit.    REVIEW OF SYSTEMS:    10 Point review of Systems was done is negative except as noted above.  PHYSICAL EXAMINATION: ECOG PERFORMANCE STATUS: 1 - Symptomatic but completely ambulatory  . Vitals:   08/31/22 0945  BP: (!) 143/61  Pulse: 86  Resp: 18  Temp: 97.7 F (36.5 C)  SpO2: 95%   Filed Weights   08/31/22 0945  Weight: 170 lb 8 oz (77.3 kg)   .Body mass index is 26.7 kg/m.  GENERAL:alert, in no acute distress and comfortable SKIN: no acute rashes, no significant lesions EYES: conjunctiva are pink and non-injected, sclera anicteric OROPHARYNX: MMM, no exudates, no oropharyngeal erythema or ulceration NECK: supple, no JVD LYMPH:  b/l cervical LNadenopathy LUNGS: clear to auscultation b/l with normal respiratory effort HEART: regular rate & rhythm ABDOMEN:  normoactive bowel sounds , non tender, not distended. Extremity: no pedal edema PSYCH: alert & oriented x 3 with fluent speech NEURO: no focal motor/sensory deficits  LABORATORY DATA:  I have reviewed the data as listed .    Latest Ref Rng & Units 08/31/2022   11:04 AM 06/19/2022    2:03 PM 08/13/2021    3:55 AM  CBC  WBC 4.0 - 10.5 K/uL 4.0  4.5  6.4   Hemoglobin 12.0 - 15.0 g/dL 12.6  14.5  12.0   Hematocrit 36.0 - 46.0 % 38.2  43.7  39.0   Platelets 150 - 400 K/uL 125  152  95    .    Latest Ref Rng &  Units 08/31/2022   11:04 AM 06/19/2022    2:03 PM 08/13/2021    3:55 AM  CMP  Glucose 70 - 99 mg/dL 114  108  159   BUN 8 - 23 mg/dL '10  11  15   '$ Creatinine 0.44 - 1.00 mg/dL 0.71  0.70  0.85   Sodium 135 - 145 mmol/L 141  142  130   Potassium 3.5 - 5.1 mmol/L 3.9  3.8  4.3   Chloride 98 - 111 mmol/L 108  105  98   CO2 22 - 32 mmol/L '28  24  23   '$ Calcium 8.9 - 10.3 mg/dL 8.6  9.4  8.4   Total Protein 6.5 - 8.1 g/dL 6.4  7.6    Total Bilirubin 0.3 - 1.2 mg/dL 0.5  0.8    Alkaline Phos 38 - 126 U/L 72  73    AST 15 - 41 U/L 17  22    ALT 0 - 44 U/L 11  16     . Lab Results  Component Value Date   LDH 163 08/31/2022    BIOPSY SURGICAL PATHOLOGY 08/14/22    RADIOGRAPHIC STUDIES: I have personally reviewed the radiological images as listed and agreed with the findings in the report. [place] CORE BIOPSY (LYMPH NODES)  Result Date: 08/14/2022 INDICATION: Lymph Node possible metastatic disease. Left tongue base/tonsillar mass EXAM: Ultrasound-guided  core needle biopsy of right cervical lymph node MEDICATIONS: None. ANESTHESIA/SEDATION: Moderate (conscious) sedation was employed during this procedure. A total of Versed 1.5 mg and Fentanyl 50 mcg was administered intravenously by the radiology nurse. Total intra-service moderate Sedation Time: 17 minutes. The patient's level of consciousness and vital signs were monitored continuously by radiology nursing throughout the procedure under my direct supervision. COMPLICATIONS: None immediate. PROCEDURE: Informed written consent was obtained from the patient after a thorough discussion of the procedural risks, benefits and alternatives. All questions were addressed. Maximal Sterile Barrier Technique was utilized including caps, mask, sterile gowns, sterile gloves, sterile drape, hand hygiene and skin antiseptic. A timeout was performed prior to the initiation of the procedure. The patient was placed supine on the exam table. Ultrasound of the right neck  demonstrated enlarged right cervical lymph node. Skin entry site was marked, and the overlying skin was prepped draped in the standard sterile fashion. Local analgesia was obtained with 1% lidocaine. Using ultrasound guidance, core needle biopsy was performed of the enlarged right cervical lymph node using an 18 gauge core biopsy device x6 total passes. Specimens were submitted in saline to pathology for further handling. Limited postprocedure imaging demonstrated no hematoma. A clean dressing was placed after manual hemostasis. The patient tolerated the procedure well without immediate complication. IMPRESSION: Successful ultrasound-guided core needle biopsy of enlarged right cervical lymph node. Electronically Signed   By: Albin Felling M.D.   On: 08/14/2022 15:27   CT SOFT TISSUE NECK W CONTRAST  Result Date: 08/09/2022 CLINICAL DATA:  Chronic sore throat.  Tonsillar mass. EXAM: CT NECK WITH CONTRAST TECHNIQUE: Multidetector CT imaging of the neck was performed using the standard protocol following the bolus administration of intravenous contrast. RADIATION DOSE REDUCTION: This exam was performed according to the departmental dose-optimization program which includes automated exposure control, adjustment of the mA and/or kV according to patient size and/or use of iterative reconstruction technique. CONTRAST:  118m ISOVUE-300 IOPAMIDOL (ISOVUE-300) INJECTION 61% COMPARISON:  None Available. FINDINGS: Pharynx and larynx: Mildly prominent mucosal soft tissue thickening at the tongue base, left eccentric. Salivary glands: No inflammation, mass, or stone. Thyroid: Approximately 1.3 cm right thyroid nodule which does not require further imaging follow-up. (Ref: J Am Coll Radiol. 2015 Feb;12(2): 143-50). Lymph nodes: Extensive pathologically enlarged lymph nodes throughout the neck bilaterally with abnormal rounded morphology and heterogeneity. Index nodes include conglomerate right level II lymph node which  measures approximately 3.2 x 2.4 cm. This node as well as other nodes in the region result in severe narrowing of the right internal jugular vein, which remains patent. More inferiorly on the right there is an index 2.1 x 1.7 cm right level II/III node which also compresses the right internal jugular vein. Slightly more inferiorly there is a 1 cm right level III node on series 2, image 64. Index left level II node measures 1.7 x 1.5 cm on series 2, image 51. More inferiorly there are 2 level III nodes which measure 1.7 and 1.4 cm on series 2, image 68. Many smaller lymph nodes in the neck bilaterally, which are concerning due to morphology/appearance. Vascular: Limited assessment due to non arterial timing. Major arteries appear grossly patent in the neck. Right internal jugular vein is severely narrowed, as described above. Limited intracranial: Negative. Visualized orbits: Negative. Mastoids and visualized paranasal sinuses: Clear. Skeleton: Severe multilevel degenerative change in the cervical spine. Upper chest: There is of probable scarring in the lung apices. No confluent consolidation. Residual contrast within the  imaged esophagus. IMPRESSION: 1. 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 Differential considerations include metastatic adenopathy versus lymphoproliferative disorder. 2. Mildly prominent mucosal soft tissue thickening at the tongue base and vallecula, left eccentric. This could be physiologic but given the above findings recommend correlation with direct inspection to exclude malignancy (including primary versus lymphoproliferative disorder). These results will be called to the ordering clinician or representative by the Radiologist Assistant, and communication documented in the PACS or [employer]. Electronically Signed   By: Margaretha Sheffield M.D.   On: 08/09/2022 14:18    ASSESSMENT & PLAN:   77 y/o female with:   1. Newly diagnosed  Non Hodgkins lymphoma  Presented as left sided sore throat with oropharyngeal mass noted on nasolaryngoscopy and bulky cervical adenopathy bilaterally  PLAN: -Educated patient on non hodgkin lymphoma. Unfortunately, there were lots of crushed artifacts in biopsy. Diagnosis could either be: Diffuse Follicular non hodgkin's lymphoma.  Diffuse enlarged B-cell lymphoma -Ki-67 level low at only 10% but the cervical LNadenopathy is increasing more rapidly. -Order larger sample excisional biopsy or cervical LN or oropharyngeal mass/tonsil or both with Dr. Fredric Dine prior to starting any treatment as this would make a big difference to recommendations moving forward. Important to get definitive tissue diagnosis. -Continue with PET scan on Monday, 09/04/22 to evaluate number of and size of lymph nodes. This would determine the stage of the cancer. High grade lymphomas often treated to cure. Low grade lymphoma often treated only if symptoms are bothersome.  -Order blood tests today to evaluate values such as uric acid and LDH levels   FOLLOW-UP: Labs today PET/CT as scheduled on 2/26 ENT referral to Dr Fredric Dine for excisional Lymph Node biopsy with indeterminate lymphoma substype. RTC with Dr Irene Limbo in 2 weeks  The total time spent in the appointment was 60 minutes* .  All of the patient's questions were answered with apparent satisfaction. The patient knows to call the clinic with any problems, questions or concerns.   Sullivan Lone MD MS AAHIVMS [facility] Hematology/Oncology Physician [facility]  .*Total Encounter Time as defined by the Centers for Medicare and Medicaid Services includes, in addition to the face-to-face time of a patient visit (documented in the note above) non-face-to-face time: obtaining and reviewing outside history, ordering and reviewing medications, tests or procedures, care coordination (communications with other health care professionals or caregivers) and  documentation in the medical record.   I,Rachel Bates,acting as a Education administrator for Sullivan Lone, MD.,have documented all relevant documentation on the behalf of Sullivan Lone, MD,as directed by  Sullivan Lone, MD while in the presence of Sullivan Lone, MD.  .I have reviewed the above documentation for accuracy and completeness, and I agree with the above. Brunetta Genera MD

## 2022-09-01 ENCOUNTER — Telehealth: Payer: Self-pay | Admitting: Hematology

## 2022-09-01 NOTE — Telephone Encounter (Signed)
Called patient per 2/22 los notes to schedule f/u. Patient scheduled and notified.

## 2022-09-02 LAB — IGG, IGA, IGM
IgA: 200 mg/dL (ref 64–422)
IgG (Immunoglobin G), Serum: 701 mg/dL (ref 586–1602)
IgM (Immunoglobulin M), Srm: 30 mg/dL (ref 26–217)

## 2022-09-04 ENCOUNTER — Ambulatory Visit (HOSPITAL_COMMUNITY)
Admission: RE | Admit: 2022-09-04 | Discharge: 2022-09-04 | Disposition: A | Discharge status: Home / Self Care | Payer: Medicare HMO | Source: Ambulatory Visit | Attending: Otolaryngology | Admitting: Otolaryngology

## 2022-09-04 DIAGNOSIS — C8331 Diffuse large B-cell lymphoma, lymph nodes of head, face, and neck: Secondary | ICD-10-CM | POA: Diagnosis not present

## 2022-09-04 DIAGNOSIS — R911 Solitary pulmonary nodule: Secondary | ICD-10-CM | POA: Diagnosis not present

## 2022-09-04 LAB — GLUCOSE, CAPILLARY: Glucose-Capillary: 111 mg/dL — ABNORMAL HIGH (ref 70–99)

## 2022-09-04 MED ORDER — FLUDEOXYGLUCOSE F - 18 (FDG) INJECTION
8.1600 | Freq: Once | INTRAVENOUS | Status: AC | PRN
  Administered 2022-09-04: 8.16 via INTRAVENOUS

## 2022-09-05 ENCOUNTER — Ambulatory Visit: Payer: Medicare HMO

## 2022-09-05 DIAGNOSIS — I255 Ischemic cardiomyopathy: Secondary | ICD-10-CM

## 2022-09-06 ENCOUNTER — Telehealth: Payer: Self-pay

## 2022-09-06 LAB — CUP PACEART REMOTE DEVICE CHECK
Battery Remaining Longevity: 6 mo
Battery Voltage: 2.82 V
Brady Statistic AP VP Percent: 0 %
Brady Statistic AP VS Percent: 5.09 %
Brady Statistic AS VP Percent: 0.03 %
Brady Statistic AS VS Percent: 94.88 %
Brady Statistic RA Percent Paced: 5.09 %
Brady Statistic RV Percent Paced: 0.03 %
Date Time Interrogation Session: 20240227153155
HighPow Impedance: 56 Ohm
HighPow Impedance: 75 Ohm
Implantable Lead Connection Status: 753985
Implantable Lead Connection Status: 753985
Implantable Lead Implant Date: 20071003
Implantable Lead Implant Date: 20071019
Implantable Lead Location: 753859
Implantable Lead Location: 753860
Implantable Lead Model: 5076
Implantable Lead Model: 6947
Implantable Pulse Generator Implant Date: 20140729
Lead Channel Impedance Value: 361 Ohm
Lead Channel Impedance Value: 456 Ohm
Lead Channel Impedance Value: 475 Ohm
Lead Channel Pacing Threshold Amplitude: 0.5 V
Lead Channel Pacing Threshold Amplitude: 1.125 V
Lead Channel Pacing Threshold Pulse Width: 0.4 ms
Lead Channel Pacing Threshold Pulse Width: 0.4 ms
Lead Channel Sensing Intrinsic Amplitude: 2.25 mV
Lead Channel Sensing Intrinsic Amplitude: 2.25 mV
Lead Channel Sensing Intrinsic Amplitude: 7.125 mV
Lead Channel Sensing Intrinsic Amplitude: 7.125 mV
Lead Channel Setting Pacing Amplitude: 2 V
Lead Channel Setting Pacing Amplitude: 2.5 V
Lead Channel Setting Pacing Pulse Width: 0.4 ms
Lead Channel Setting Sensing Sensitivity: 0.3 mV
Zone Setting Status: 755011

## 2022-09-06 NOTE — Telephone Encounter (Addendum)
Alert received from [employer]:  Scheduled remote reviewed. Normal device function.   Battery estimated 16mo- route to triage  Monthly remote checks scheduled.  Outreach made to Pt.  Pt advised of above.  Pt has follow up appt with [facility] in 6 months.  All questions answered.

## 2022-09-07 ENCOUNTER — Ambulatory Visit (HOSPITAL_COMMUNITY): Payer: Medicare HMO

## 2022-09-12 DIAGNOSIS — J392 Other diseases of pharynx: Secondary | ICD-10-CM | POA: Diagnosis not present

## 2022-09-12 DIAGNOSIS — Z7901 Long term (current) use of anticoagulants: Secondary | ICD-10-CM | POA: Diagnosis not present

## 2022-09-12 DIAGNOSIS — C8331 Diffuse large B-cell lymphoma, lymph nodes of head, face, and neck: Secondary | ICD-10-CM | POA: Diagnosis not present

## 2022-09-12 DIAGNOSIS — R590 Localized enlarged lymph nodes: Secondary | ICD-10-CM | POA: Diagnosis not present

## 2022-09-18 ENCOUNTER — Other Ambulatory Visit: Payer: Self-pay | Admitting: Otolaryngology

## 2022-09-18 ENCOUNTER — Telehealth: Payer: Self-pay | Admitting: Internal Medicine

## 2022-09-18 NOTE — Telephone Encounter (Signed)
Patient with diagnosis of atrial fibrillation on Eliquis for anticoagulation.    Procedure:  Direct laryngoscopy and external biopsy of right deep cervical lymph nodes   Date of Surgery:  Clearance 03/23/2  CHA2DS2-VASc Score = 5   This indicates a 7.2% annual risk of stroke. The patient's score is based upon: CHF History: 1 HTN History: 0 Diabetes History: 0 Stroke History: 0 Vascular Disease History: 1 Age Score: 2 Gender Score: 1   Patient has a history of recurrent VTE (DVT in 2007, unprovoked PE in 2012), also has family history of blood clots which her father died from. She was previously bridged with Lovenox by Dr Lindi Adie when she held her Eliquis for a few days in 2019. She was cleared in May 2021 by Dr. Lovena Le to hold 3 days without a bridge.   CrCl 81 Platelet count 125  Per office protocol, patient can hold Eliquis for 2 days prior to procedure.   Patient will not need bridging with Lovenox (enoxaparin) around procedure.  **This guidance is not considered finalized until pre-operative APP has relayed final recommendations.**

## 2022-09-18 NOTE — Telephone Encounter (Signed)
   Pre-operative Risk Assessment    Patient Name: Rachel Bates  DOB: [birth year] MRN: [id number]      Request for Surgical Clearance    Procedure:  Direct laryngoscopy and external biopsy of right deep cervical lymph nodes  Date of Surgery:  Clearance 09/30/22                                 Surgeon:  Dr. Orie Rout  Surgeon's Group or Practice Name:  [facility] Phone number:  [phone] Fax number:  [phone] Attention: Angie   Type of Clearance Requested:   - Pharmacy:  Hold Apixaban (Eliquis) 2 days   Type of Anesthesia:  General    Additional requests/questions:    Signed, Annabell Sabal   09/18/2022, 2:41 PM

## 2022-09-18 NOTE — Telephone Encounter (Signed)
Pharmacy please advise on holding Eliquis prior to laryngoscopy and external biopsy of right deep cervical lymph nodes scheduled for 09/30/2022. Thank you.

## 2022-09-18 NOTE — Telephone Encounter (Signed)
   Patient Name: Rachel Bates  DOB: 08/15/1945 MRN: [id number]  Primary Cardiologist: Cristopher Peru, MD  Clinical pharmacists have reviewed the patient's past medical history, labs, and current medications as part of preoperative protocol coverage. The following recommendations have been made:   Per office protocol, patient can hold Eliquis for 2 days prior to procedure.   Patient will not need bridging with Lovenox (enoxaparin) around procedure.   I will route this recommendation to the requesting party via Epic fax function and remove from pre-op pool.  Please call with questions.  Mable Fill, Marissa Nestle, NP 09/18/2022, 5:37 PM

## 2022-09-25 ENCOUNTER — Telehealth: Payer: Self-pay | Admitting: Internal Medicine

## 2022-09-25 ENCOUNTER — Other Ambulatory Visit: Payer: Self-pay

## 2022-09-25 DIAGNOSIS — C8511 Unspecified B-cell lymphoma, lymph nodes of head, face, and neck: Secondary | ICD-10-CM

## 2022-09-25 MED ORDER — PREDNISONE 20 MG PO TABS: 60.0000 mg | ORAL_TABLET | Freq: Every day | ORAL | 0 refills | Status: DC

## 2022-09-25 NOTE — Telephone Encounter (Signed)
Patient is schedule for a remote transmission on 3/28.  She is schedule to have a biopsy of her lymph nodes on 3/27, she thinks she might have to spend the night in the hospital.  She is wondering is she can do the remote transmission the day before. Please advise.

## 2022-09-26 NOTE — Telephone Encounter (Signed)
I spoke with the patient and let her know it will be okay to send her remote check on 10/03/2022.

## 2022-09-27 ENCOUNTER — Encounter: Payer: Self-pay | Admitting: Internal Medicine

## 2022-09-27 NOTE — Pre-Procedure Instructions (Signed)
Surgical Instructions    Your procedure is scheduled on October 04, 2022.  Report to [facility] Main Entrance "A" at 7:10 A.M., then check in with the Admitting office.  Call this number if you have problems the morning of surgery:  [phone]   If you have any questions prior to your surgery date call [phone]: Open Monday-Friday 8am-4pm If you experience any cold or flu symptoms such as cough, fever, chills, shortness of breath, etc. between now and your scheduled surgery, please notify us at the above number     Remember:  Do not eat after midnight the night before your surgery  You may drink clear liquids until 7:10AM the morning of your surgery.   Clear liquids allowed are: Water, Non-Citrus Juices (without pulp), Carbonated Beverages, Clear Tea, Black Coffee ONLY (NO MILK, CREAM OR POWDERED CREAMER of any kind), and Gatorade    Take these medicines the morning of surgery with A SIP OF WATER:  acetaminophen (TYLENOL)  carvedilol (COREG) gabapentin (NEURONTIN)  loratadine (CLARITIN)  pantoprazole (PROTONIX)  venlafaxine (EFFEXOR)   Follow your surgeon's instructions on when to stop apixaban (ELIQUIS).  If no instructions were given by your surgeon then you will need to call the office to get those instructions.    As of today, STOP taking any Aspirin (unless otherwise instructed by your surgeon) Aleve, Naproxen, Ibuprofen, Motrin, Advil, Goody's, BC's, all herbal medications, fish oil, and all vitamins.   [facility] is not responsible for any belongings or valuables.    Do NOT Smoke (Tobacco/Vaping)  24 hours prior to your procedure  If you use a CPAP at night, you may bring your mask for your overnight stay.   Contacts, glasses, hearing aids, dentures or partials may not be worn into surgery, please bring cases for these belongings   For patients admitted to the hospital, discharge time will be determined by your treatment team.   Patients discharged the day of  surgery will not be allowed to drive home, and someone needs to stay with them for 24 hours.   SURGICAL WAITING ROOM VISITATION Patients having surgery or a procedure may have no more than 2 support people in the waiting area - these visitors may rotate.   Children under the age of 75 must have an adult with them who is not the patient. If the patient needs to stay at the hospital during part of their recovery, the visitor guidelines for inpatient rooms apply. Pre-op nurse will coordinate an appropriate time for 1 support person to accompany patient in pre-op.  This support person may not rotate.   Please refer to [email or web address] for the visitor guidelines for Inpatients (after your surgery is over and you are in a regular room).    Special instructions:    Oral Hygiene is also important to reduce your risk of infection.  Remember - BRUSH YOUR TEETH THE MORNING OF SURGERY WITH YOUR REGULAR TOOTHPASTE   [facility]- Preparing For Surgery  Before surgery, you can play an important role. Because skin is not sterile, your skin needs to be as free of germs as possible. You can reduce the number of germs on your skin by washing with CHG (chlorahexidine gluconate) Soap before surgery.  CHG is an antiseptic cleaner which kills germs and bonds with the skin to continue killing germs even after washing.     Please do not use if you have an allergy to CHG or antibacterial soaps. If your skin becomes reddened/irritated stop using the  CHG.  Do not shave (including legs and underarms) for at least 48 hours prior to first CHG shower. It is OK to shave your face.  Please follow these instructions carefully.     Shower the NIGHT BEFORE SURGERY and the MORNING OF SURGERY with CHG Soap.   If you chose to wash your hair, wash your hair first as usual with your normal shampoo. After you shampoo, rinse your hair and body thoroughly to remove the shampoo.   Then [employer] and genitals (private parts) with your normal soap and rinse thoroughly to remove soap.  After that Use CHG Soap as you would any other liquid soap. You can apply CHG directly to the skin and wash gently with a scrungie or a clean washcloth.   Apply the CHG Soap to your body ONLY FROM THE NECK DOWN.  Do not use on open wounds or open sores. Avoid contact with your eyes, ears, mouth and genitals (private parts). Wash Face and genitals (private parts)  with your normal soap.   Wash thoroughly, paying special attention to the area where your surgery will be performed.  Thoroughly rinse your body with warm water from the neck down.  DO NOT shower/wash with your normal soap after using and rinsing off the CHG Soap.  Pat yourself dry with a CLEAN TOWEL.  Wear CLEAN PAJAMAS to bed the night before surgery  Place CLEAN SHEETS on your bed the night before your surgery  DO NOT SLEEP WITH PETS.   Day of Surgery:  Take a shower with CHG soap. Wear Clean/Comfortable clothing the morning of surgery Do not wear jewelry or makeup. Do not wear lotions, powders, perfumes/cologne or deodorant. Do not shave 48 hours prior to surgery.  Men may shave face and neck. Do not bring valuables to the hospital. Do not wear nail polish, gel polish, artificial nails, or any other type of covering on natural nails (fingers and toes) If you have artificial nails or gel coating that need to be removed by a nail salon, please have this removed prior to surgery. Artificial nails or gel coating may interfere with anesthesia's ability to adequately monitor your vital signs. Remember to brush your teeth WITH YOUR REGULAR TOOTHPASTE.    If you received a COVID test during your pre-op visit, it is requested that you wear a mask when out in public, stay away from anyone that may not be feeling well, and notify your surgeon if you develop symptoms. If you have been in contact with anyone that has tested  positive in the last 10 days, please notify your surgeon.    Please read over the following fact sheets that you were given.

## 2022-09-27 NOTE — Progress Notes (Signed)
PERIOPERATIVE PRESCRIPTION FOR IMPLANTED CARDIAC DEVICE PROGRAMMING  Patient Information: Name:  Rachel Bates  DOB:  08-Mar-1946  MRN:  [id number]    Planned Procedure:  DIRECT LARYNGOSCOPY - Right  EXCISIONAL OF RIGHT DEEP CERVICAL LYMPH NODE - Right   Surgeon:  Ebbie Latus, MD  Date of Procedure:  10/04/22  Cautery will be used.  Position during surgery:  supine   Please send documentation back to:  [facility] (Fax # [phone])  Device Information:  Clinic EP Physician:  Cristopher Peru, MD   Device Type:  Defibrillator Manufacturer and Phone #:  Medtronic: [phone] Pacemaker Dependent?:  No. Date of Last Device Check:  09/05/2022 Normal Device Function?:  Yes.    Electrophysiologist's Recommendations:  Have magnet available. Provide continuous ECG monitoring when magnet is used or reprogramming is to be performed.  Procedure may interfere with device function.  Magnet should be placed over device during procedure.  Per Device Clinic Standing Orders, Simone Curia, RN  1:58 PM 09/27/2022

## 2022-09-28 ENCOUNTER — Other Ambulatory Visit: Payer: Self-pay

## 2022-09-28 ENCOUNTER — Encounter (HOSPITAL_COMMUNITY): Payer: Self-pay

## 2022-09-28 ENCOUNTER — Encounter (HOSPITAL_COMMUNITY)
Admission: RE | Admit: 2022-09-28 | Discharge: 2022-09-28 | Disposition: A | Discharge status: Home / Self Care | Payer: Medicare HMO | Source: Ambulatory Visit | Attending: Otolaryngology | Admitting: Otolaryngology

## 2022-09-28 VITALS — BP 152/79 | HR 96 | Temp 98.4°F | Resp 17 | Ht 67.0 in | Wt 163.0 lb

## 2022-09-28 DIAGNOSIS — Z01812 Encounter for preprocedural laboratory examination: Secondary | ICD-10-CM | POA: Diagnosis not present

## 2022-09-28 DIAGNOSIS — I10 Essential (primary) hypertension: Secondary | ICD-10-CM | POA: Insufficient documentation

## 2022-09-28 DIAGNOSIS — I2510 Atherosclerotic heart disease of native coronary artery without angina pectoris: Secondary | ICD-10-CM | POA: Diagnosis not present

## 2022-09-28 DIAGNOSIS — Z7901 Long term (current) use of anticoagulants: Secondary | ICD-10-CM | POA: Insufficient documentation

## 2022-09-28 DIAGNOSIS — C8591 Non-Hodgkin lymphoma, unspecified, lymph nodes of head, face, and neck: Secondary | ICD-10-CM | POA: Diagnosis not present

## 2022-09-28 DIAGNOSIS — Z01818 Encounter for other preprocedural examination: Secondary | ICD-10-CM

## 2022-09-28 DIAGNOSIS — D696 Thrombocytopenia, unspecified: Secondary | ICD-10-CM | POA: Diagnosis not present

## 2022-09-28 DIAGNOSIS — I480 Paroxysmal atrial fibrillation: Secondary | ICD-10-CM | POA: Diagnosis not present

## 2022-09-28 DIAGNOSIS — Z950 Presence of cardiac pacemaker: Secondary | ICD-10-CM | POA: Diagnosis not present

## 2022-09-28 HISTORY — DX: Essential (primary) hypertension: I10

## 2022-09-28 HISTORY — DX: Acute myocardial infarction, unspecified: I21.9

## 2022-09-28 LAB — CBC
HCT: 42.9 % (ref 36.0–46.0)
Hemoglobin: 14.3 g/dL (ref 12.0–15.0)
MCH: 27.8 pg (ref 26.0–34.0)
MCHC: 33.3 g/dL (ref 30.0–36.0)
MCV: 83.3 fL (ref 80.0–100.0)
Platelets: 105 10*3/uL — ABNORMAL LOW (ref 150–400)
RBC: 5.15 MIL/uL — ABNORMAL HIGH (ref 3.87–5.11)
RDW: 15.7 % — ABNORMAL HIGH (ref 11.5–15.5)
WBC: 3.8 10*3/uL — ABNORMAL LOW (ref 4.0–10.5)
nRBC: 0 % (ref 0.0–0.2)

## 2022-09-28 LAB — BASIC METABOLIC PANEL
Anion gap: 9 (ref 5–15)
BUN: 11 mg/dL (ref 8–23)
CO2: 25 mmol/L (ref 22–32)
Calcium: 9.2 mg/dL (ref 8.9–10.3)
Chloride: 105 mmol/L (ref 98–111)
Creatinine, Ser: 0.75 mg/dL (ref 0.44–1.00)
GFR, Estimated: >60 mL/min (ref >60)
Glucose, Bld: 109 mg/dL — ABNORMAL HIGH (ref 70–99)
Potassium: 4 mmol/L (ref 3.5–5.1)
Sodium: 139 mmol/L (ref 135–145)

## 2022-09-28 NOTE — Progress Notes (Signed)
[employer] Representative called and notified of patients procedure scheduled for 10/04/2022 at 1011. Rep notified of type of procedure and when the patient is scheduled to arrive.

## 2022-09-28 NOTE — Progress Notes (Signed)
PCP - Janine Limbo PA-C Cardiologist - Cristopher Peru, MD  PPM/ICD - ICD Device Orders - Faxed  to device clinic, per [facility], RN Rep Notified - [employer] notified 09/28/2022  Chest x-ray - Denies EKG - 02/152024 Stress Test - Denies ECHO - Denies [place] in [place] per patient  Sleep Study - OSA+  DM: Denies  Blood Thinner Instructions: Per cardiologist, patient to hold Eliquis 2 days prior to surgery. Last dose on 10/02/2022 Aspirin Instructions:N/A  ERAS Protcol - Yes PRE-SURGERY Ensure or G2- No drink  COVID TEST-  DOS   Anesthesia review: Yes, cardiac history. Cardiac clearance note on 09/18/2022.  Patient denies shortness of breath, fever, cough and chest pain at PAT appointment   All instructions explained to the patient, with a verbal understanding of the material. Patient agrees to go over the instructions while at home for a better understanding. The opportunity to ask questions was provided.

## 2022-09-29 ENCOUNTER — Ambulatory Visit: Payer: Medicare HMO | Admitting: Hematology

## 2022-09-29 ENCOUNTER — Inpatient Hospital Stay: Payer: Medicare HMO | Admitting: Hematology

## 2022-09-29 ENCOUNTER — Inpatient Hospital Stay: Payer: Medicare HMO

## 2022-09-29 ENCOUNTER — Other Ambulatory Visit: Payer: Medicare HMO

## 2022-09-29 NOTE — Anesthesia Preprocedure Evaluation (Signed)
Anesthesia Evaluation  Patient identified by MRN, date of birth, ID band Patient awake    Reviewed: Allergy & Precautions, NPO status , Patient's Chart, lab work & pertinent test results, reviewed documented beta blocker date and time   History of Anesthesia Complications Negative for: history of anesthetic complications  Airway Mallampati: III  TM Distance: >3 FB Neck ROM: Full    Dental  (+) Dental Advisory Given, Edentulous Upper, Edentulous Lower   Pulmonary neg shortness of breath, sleep apnea , neg COPD, neg recent URI, former smoker   breath sounds clear to auscultation       Cardiovascular hypertension, Pt. on medications and Pt. on home beta blockers + CAD, + Past MI, + Cardiac Stents and +CHF  + dysrhythmias Atrial Fibrillation and Ventricular Tachycardia + Cardiac Defibrillator  Rhythm:Regular     Neuro/Psych  Headaches PSYCHIATRIC DISORDERS Anxiety Depression       GI/Hepatic negative GI ROS, Neg liver ROS,,,  Endo/Other  negative endocrine ROS    Renal/GU negative Renal ROS     Musculoskeletal  (+) Arthritis ,    Abdominal   Peds  Hematology  (+) Blood dyscrasia Lab Results      Component                Value               Date                      WBC                      3.8 (L)             09/28/2022                HGB                      14.3                09/28/2022                HCT                      42.9                09/28/2022                MCV                      83.3                09/28/2022                PLT                      105 (L)             09/28/2022            Eliquis held   Anesthesia Other Findings   Reproductive/Obstetrics                             Anesthesia Physical Anesthesia Plan  ASA: 3  Anesthesia Plan: General   Post-op Pain Management: Minimal or no pain anticipated   Induction: Intravenous  PONV Risk Score and Plan: 3  and Ondansetron and Dexamethasone  Airway Management Planned:  Additional Equipment: None  Intra-op Plan:   Post-operative Plan: Extubation in OR  Informed Consent: I have reviewed the patients History and Physical, chart, labs and discussed the procedure including the risks, benefits and alternatives for the proposed anesthesia with the patient or authorized representative who has indicated his/her understanding and acceptance.     Dental advisory given  Plan Discussed with: CRNA  Anesthesia Plan Comments: (PAT note by Karoline Caldwell, PA-C: Follows with cardiology for history of CAD s/p DES 1995, PE (2012), HTN, ICM s/p Med[employer] PPM (original placement 2007, generator change 2014), paroxysmal A-fib, V. tach.  Last seen by Dr. Lovena Le 08/24/2022 noted to be stable from cardiac standpoint.  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  Her device was noted to be working normally, no changes made to management.  Per telephone encounter 09/18/2022 she was cleared to hold Eliquis 2 days prior to surgery.  History of OSA, not on CPAP.  Patient reports last dose Eliquis 10/02/2022.  Preop labs reviewed, moderate thrombocytopenia with platelets 105k, otherwise unremarkable.  EKG 08/24/2022: Atrial paced rhythm with prolonged AV conduction.  Rate 92.  Cannot rule out anterior infarct, age undetermined.  Perioperative prescription for implanted cardiac device programming 09/27/2022: Device Information:  Clinic EP Physician:  Cristopher Peru, MD   Device Type:  Defibrillator Manufacturer and Phone #:  Medtronic: [phone] Pacemaker Dependent?:  No. Date of Last Device Check:  2/27/2024Normal Device Function?:  Yes.    Electrophysiologist's Recommendations:  ? Have magnet available. ? Provide continuous ECG monitoring when magnet is used or reprogramming is to be performed.  ? Procedure may interfere with device function.  Magnet should be placed  over device during procedure.  CT soft tissue neck 08/09/2022: IMPRESSION: 1. 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 Differential considerations include metastatic adenopathy versus lymphoproliferative disorder. 2. Mildly prominent mucosal soft tissue thickening at the tongue base and vallecula, left eccentric. This could be physiologic but given the above findings recommend correlation with direct inspection to exclude malignancy (including primary versus lymphoproliferative disorder).  These results will be called to the ordering clinician or representative by the Radiologist Assistant, and communication documented in the PACS or [facility].   )        Anesthesia Quick Evaluation

## 2022-09-29 NOTE — Progress Notes (Signed)
Anesthesia Chart Review:  Follows with cardiology for history of CAD s/p DES 1995, PE (2012), HTN, ICM s/p Med[employer] PPM (original placement 2007, generator change 2014), paroxysmal A-fib, V. tach.  Last seen by Dr. Lovena Le 08/24/2022 noted to be stable from cardiac standpoint.  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  Her device was noted to be working normally, no changes made to management.  Per telephone encounter 09/18/2022 she was cleared to hold Eliquis 2 days prior to surgery.  History of OSA, not on CPAP.  Patient reports last dose Eliquis 10/02/2022.  Preop labs reviewed, moderate thrombocytopenia with platelets 105k, otherwise unremarkable.  EKG 08/24/2022: Atrial paced rhythm with prolonged AV conduction.  Rate 92.  Cannot rule out anterior infarct, age undetermined.  Perioperative prescription for implanted cardiac device programming 09/27/2022: Device Information:   Clinic EP Physician:  Cristopher Peru, MD    Device Type:  Defibrillator Manufacturer and Phone #:  [employer]: [phone] Pacemaker Dependent?:  No. Date of Last Device Check:  09/05/2022       Normal Device Function?:  Yes.     Electrophysiologist's Recommendations:   Have magnet available. Provide continuous ECG monitoring when magnet is used or reprogramming is to be performed.  Procedure may interfere with device function.  Magnet should be placed over device during procedure.   CT soft tissue neck 08/09/2022: IMPRESSION: 1. 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 Differential considerations include metastatic adenopathy versus lymphoproliferative disorder. 2. Mildly prominent mucosal soft tissue thickening at the tongue base and vallecula, left eccentric. This could be physiologic but given the above findings recommend correlation with direct inspection to exclude malignancy (including primary versus lymphoproliferative  disorder).   These results will be called to the ordering clinician or representative by the Radiologist Assistant, and communication documented in the PACS or [employer].     Wynonia Musty [facility] Stay Center/Anesthesiology Phone [phone] 09/29/2022 1:25 PM

## 2022-10-03 ENCOUNTER — Ambulatory Visit (INDEPENDENT_AMBULATORY_CARE_PROVIDER_SITE_OTHER): Payer: Medicare HMO

## 2022-10-03 DIAGNOSIS — I255 Ischemic cardiomyopathy: Secondary | ICD-10-CM

## 2022-10-03 NOTE — Progress Notes (Signed)
patient's daughter voiced understanding of new arrival time of 25 and clear liquids/ERAS okay until 60

## 2022-10-04 ENCOUNTER — Encounter (HOSPITAL_COMMUNITY)
Admission: RE | Disposition: A | Discharge status: Home / Self Care | Payer: Self-pay | Source: Home / Self Care | Attending: Otolaryngology

## 2022-10-04 ENCOUNTER — Ambulatory Visit (HOSPITAL_BASED_OUTPATIENT_CLINIC_OR_DEPARTMENT_OTHER): Payer: Medicare HMO | Admitting: Anesthesiology

## 2022-10-04 ENCOUNTER — Other Ambulatory Visit (HOSPITAL_COMMUNITY): Payer: Self-pay

## 2022-10-04 ENCOUNTER — Other Ambulatory Visit: Payer: Self-pay

## 2022-10-04 ENCOUNTER — Encounter (HOSPITAL_COMMUNITY): Payer: Self-pay | Admitting: Otolaryngology

## 2022-10-04 ENCOUNTER — Ambulatory Visit (HOSPITAL_COMMUNITY): Payer: Medicare HMO | Admitting: Physician Assistant

## 2022-10-04 ENCOUNTER — Ambulatory Visit (HOSPITAL_COMMUNITY)
Admission: RE | Admit: 2022-10-04 | Discharge: 2022-10-04 | Disposition: A | Discharge status: Home / Self Care | Payer: Medicare HMO | Attending: Otolaryngology | Admitting: Otolaryngology

## 2022-10-04 DIAGNOSIS — I509 Heart failure, unspecified: Secondary | ICD-10-CM

## 2022-10-04 DIAGNOSIS — I2510 Atherosclerotic heart disease of native coronary artery without angina pectoris: Secondary | ICD-10-CM | POA: Insufficient documentation

## 2022-10-04 DIAGNOSIS — I252 Old myocardial infarction: Secondary | ICD-10-CM

## 2022-10-04 DIAGNOSIS — Z87891 Personal history of nicotine dependence: Secondary | ICD-10-CM | POA: Insufficient documentation

## 2022-10-04 DIAGNOSIS — J392 Other diseases of pharynx: Secondary | ICD-10-CM

## 2022-10-04 DIAGNOSIS — R221 Localized swelling, mass and lump, neck: Secondary | ICD-10-CM | POA: Diagnosis present

## 2022-10-04 DIAGNOSIS — Z1152 Encounter for screening for COVID-19: Secondary | ICD-10-CM | POA: Insufficient documentation

## 2022-10-04 DIAGNOSIS — I5022 Chronic systolic (congestive) heart failure: Secondary | ICD-10-CM | POA: Diagnosis not present

## 2022-10-04 DIAGNOSIS — C8331 Diffuse large B-cell lymphoma, lymph nodes of head, face, and neck: Secondary | ICD-10-CM | POA: Insufficient documentation

## 2022-10-04 DIAGNOSIS — C383 Malignant neoplasm of mediastinum, part unspecified: Secondary | ICD-10-CM | POA: Diagnosis not present

## 2022-10-04 DIAGNOSIS — I110 Hypertensive heart disease with heart failure: Secondary | ICD-10-CM | POA: Insufficient documentation

## 2022-10-04 DIAGNOSIS — F418 Other specified anxiety disorders: Secondary | ICD-10-CM | POA: Diagnosis not present

## 2022-10-04 DIAGNOSIS — J351 Hypertrophy of tonsils: Secondary | ICD-10-CM | POA: Diagnosis not present

## 2022-10-04 DIAGNOSIS — Z7901 Long term (current) use of anticoagulants: Secondary | ICD-10-CM | POA: Insufficient documentation

## 2022-10-04 DIAGNOSIS — R590 Localized enlarged lymph nodes: Secondary | ICD-10-CM | POA: Diagnosis not present

## 2022-10-04 DIAGNOSIS — Z95810 Presence of automatic (implantable) cardiac defibrillator: Secondary | ICD-10-CM | POA: Diagnosis not present

## 2022-10-04 DIAGNOSIS — Z955 Presence of coronary angioplasty implant and graft: Secondary | ICD-10-CM | POA: Diagnosis not present

## 2022-10-04 DIAGNOSIS — Z09 Encounter for follow-up examination after completed treatment for conditions other than malignant neoplasm: Secondary | ICD-10-CM | POA: Insufficient documentation

## 2022-10-04 DIAGNOSIS — C8330 Diffuse large B-cell lymphoma, unspecified site: Secondary | ICD-10-CM | POA: Diagnosis not present

## 2022-10-04 HISTORY — PX: LYMPH NODE BIOPSY: SHX201

## 2022-10-04 HISTORY — PX: DIRECT LARYNGOSCOPY: SHX5326

## 2022-10-04 LAB — CUP PACEART REMOTE DEVICE CHECK
Battery Remaining Longevity: 5 mo
Battery Voltage: 2.84 V
Brady Statistic AP VP Percent: 0.01 %
Brady Statistic AP VS Percent: 3.92 %
Brady Statistic AS VP Percent: 0.03 %
Brady Statistic AS VS Percent: 96.04 %
Brady Statistic RA Percent Paced: 3.9 %
Brady Statistic RV Percent Paced: 0.04 %
Date Time Interrogation Session: 20240327034830
HighPow Impedance: 56 Ohm
HighPow Impedance: 71 Ohm
Implantable Lead Connection Status: 753985
Implantable Lead Connection Status: 753985
Implantable Lead Implant Date: 20071003
Implantable Lead Implant Date: 20071019
Implantable Lead Location: 753859
Implantable Lead Location: 753860
Implantable Lead Model: 5076
Implantable Lead Model: 6947
Implantable Pulse Generator Implant Date: 20140729
Lead Channel Impedance Value: 304 Ohm
Lead Channel Impedance Value: 399 Ohm
Lead Channel Impedance Value: 475 Ohm
Lead Channel Pacing Threshold Amplitude: 0.5 V
Lead Channel Pacing Threshold Amplitude: 1.125 V
Lead Channel Pacing Threshold Pulse Width: 0.4 ms
Lead Channel Pacing Threshold Pulse Width: 0.4 ms
Lead Channel Sensing Intrinsic Amplitude: 11.25 mV
Lead Channel Sensing Intrinsic Amplitude: 11.25 mV
Lead Channel Sensing Intrinsic Amplitude: 3.375 mV
Lead Channel Sensing Intrinsic Amplitude: 3.375 mV
Lead Channel Setting Pacing Amplitude: 2 V
Lead Channel Setting Pacing Amplitude: 2.5 V
Lead Channel Setting Pacing Pulse Width: 0.4 ms
Lead Channel Setting Sensing Sensitivity: 0.3 mV
Zone Setting Status: 755011

## 2022-10-04 LAB — SARS CORONAVIRUS 2 BY RT PCR: SARS Coronavirus 2 by RT PCR: NEGATIVE

## 2022-10-04 SURGERY — DIRECT LARYNGOSCOPY
Anesthesia: General | Laterality: Right

## 2022-10-04 MED ORDER — PROPOFOL 10 MG/ML IV BOLUS
INTRAVENOUS | Status: AC
  Filled 2022-10-04: qty 20

## 2022-10-04 MED ORDER — HYDROCODONE-ACETAMINOPHEN 5-325 MG PO TABS
1.0000 | ORAL_TABLET | Freq: Four times a day (QID) | ORAL | 0 refills | Status: AC | PRN
  Filled 2022-10-04: qty 8, 2d supply, fill #0

## 2022-10-04 MED ORDER — FENTANYL CITRATE (PF) 250 MCG/5ML IJ SOLN
INTRAMUSCULAR | Status: DC | PRN
  Administered 2022-10-04: 100 ug via INTRAVENOUS
  Administered 2022-10-04: 50 ug via INTRAVENOUS

## 2022-10-04 MED ORDER — ORAL CARE MOUTH RINSE: 15.0000 mL | Freq: Once | OROMUCOSAL | Status: AC

## 2022-10-04 MED ORDER — PROPOFOL 10 MG/ML IV BOLUS
INTRAVENOUS | Status: DC | PRN
  Administered 2022-10-04: 110 mg via INTRAVENOUS

## 2022-10-04 MED ORDER — PHENYLEPHRINE 80 MCG/ML (10ML) SYRINGE FOR IV PUSH (FOR BLOOD PRESSURE SUPPORT)
PREFILLED_SYRINGE | INTRAVENOUS | Status: DC | PRN
  Administered 2022-10-04: 160 ug via INTRAVENOUS
  Administered 2022-10-04: 120 ug via INTRAVENOUS
  Administered 2022-10-04: 160 ug via INTRAVENOUS
  Administered 2022-10-04: 120 ug via INTRAVENOUS

## 2022-10-04 MED ORDER — CEFAZOLIN SODIUM-DEXTROSE 2-4 GM/100ML-% IV SOLN
2.0000 g | INTRAVENOUS | Status: AC
  Administered 2022-10-04: 2 g via INTRAVENOUS
  Filled 2022-10-04: qty 100

## 2022-10-04 MED ORDER — CHLORHEXIDINE GLUCONATE 0.12 % MT SOLN
15.0000 mL | Freq: Once | OROMUCOSAL | Status: AC
  Administered 2022-10-04: 15 mL via OROMUCOSAL
  Filled 2022-10-04: qty 15

## 2022-10-04 MED ORDER — LIDOCAINE 2% (20 MG/ML) 5 ML SYRINGE
INTRAMUSCULAR | Status: DC | PRN
  Administered 2022-10-04: 30 mg via INTRAVENOUS

## 2022-10-04 MED ORDER — HEMOSTATIC AGENTS (NO CHARGE) OPTIME
TOPICAL | Status: DC | PRN
  Administered 2022-10-04: 1 via TOPICAL

## 2022-10-04 MED ORDER — PHENYLEPHRINE HCL-NACL 20-0.9 MG/250ML-% IV SOLN
INTRAVENOUS | Status: DC | PRN
  Administered 2022-10-04: 50 ug/min via INTRAVENOUS

## 2022-10-04 MED ORDER — LACTATED RINGERS IV SOLN: INTRAVENOUS | Status: DC

## 2022-10-04 MED ORDER — DEXAMETHASONE SODIUM PHOSPHATE 10 MG/ML IJ SOLN
INTRAMUSCULAR | Status: DC | PRN
  Administered 2022-10-04: 4 mg via INTRAVENOUS

## 2022-10-04 MED ORDER — LIDOCAINE-EPINEPHRINE 1 %-1:100000 IJ SOLN
INTRAMUSCULAR | Status: AC
  Filled 2022-10-04: qty 1

## 2022-10-04 MED ORDER — LIDOCAINE-EPINEPHRINE 1 %-1:100000 IJ SOLN
INTRAMUSCULAR | Status: DC | PRN
  Administered 2022-10-04: 10 mL

## 2022-10-04 MED ORDER — SUCCINYLCHOLINE CHLORIDE 200 MG/10ML IV SOSY
PREFILLED_SYRINGE | INTRAVENOUS | Status: DC | PRN
  Administered 2022-10-04: 120 mg via INTRAVENOUS

## 2022-10-04 MED ORDER — 0.9 % SODIUM CHLORIDE (POUR BTL) OPTIME
TOPICAL | Status: DC | PRN
  Administered 2022-10-04: 1000 mL

## 2022-10-04 MED ORDER — ONDANSETRON HCL 4 MG/2ML IJ SOLN
INTRAMUSCULAR | Status: DC | PRN
  Administered 2022-10-04: 4 mg via INTRAVENOUS

## 2022-10-04 MED ORDER — EPHEDRINE SULFATE-NACL 50-0.9 MG/10ML-% IV SOSY
PREFILLED_SYRINGE | INTRAVENOUS | Status: DC | PRN
  Administered 2022-10-04: 5 mg via INTRAVENOUS
  Administered 2022-10-04 (×2): 10 mg via INTRAVENOUS

## 2022-10-04 MED ORDER — EPINEPHRINE HCL (NASAL) 0.1 % NA SOLN
NASAL | Status: AC
  Filled 2022-10-04: qty 30

## 2022-10-04 MED ORDER — ACETAMINOPHEN 160 MG/5ML PO SOLN: 1000.0000 mg | Freq: Once | ORAL | Status: DC | PRN

## 2022-10-04 MED ORDER — FENTANYL CITRATE (PF) 100 MCG/2ML IJ SOLN: 25.0000 ug | INTRAMUSCULAR | Status: DC | PRN

## 2022-10-04 MED ORDER — FENTANYL CITRATE (PF) 250 MCG/5ML IJ SOLN
INTRAMUSCULAR | Status: AC
  Filled 2022-10-04: qty 5

## 2022-10-04 MED ORDER — SODIUM CHLORIDE 0.9 % IV SOLN
0.1500 ug/kg/min | INTRAVENOUS | Status: DC
  Administered 2022-10-04: .03 ug/kg/min via INTRAVENOUS
  Filled 2022-10-04: qty 1000

## 2022-10-04 MED ORDER — ACETAMINOPHEN 10 MG/ML IV SOLN: 1000.0000 mg | Freq: Once | INTRAVENOUS | Status: DC | PRN

## 2022-10-04 MED ORDER — ACETAMINOPHEN 500 MG PO TABS: 1000.0000 mg | ORAL_TABLET | Freq: Once | ORAL | Status: DC | PRN

## 2022-10-04 SURGICAL SUPPLY — 48 items
ADH SKN CLS APL DERMABOND .7 (GAUZE/BANDAGES/DRESSINGS) ×1
APPLIER CLIP 9.375 SM OPEN (CLIP)
APR CLP SM 9.3 20 MLT OPN (CLIP)
BAG COUNTER SPONGE SURGICOUNT (BAG) ×1
BAG SPNG CNTER NS LX DISP (BAG) ×1
BALLN PULM 15 16.5 18X75 (BALLOONS)
CANISTER SUCT 3000ML PPV (MISCELLANEOUS) ×1
CNTNR URN SCR LID CUP LEK RST (MISCELLANEOUS) ×1
CONT SPEC 4OZ STRL OR WHT (MISCELLANEOUS) ×1
CORD BIPOLAR FORCEPS 12FT (ELECTRODE) ×1
COVER BACK TABLE 60X90IN (DRAPES) ×1
COVER MAYO STAND STRL (DRAPES) ×1
COVER SURGICAL LIGHT HANDLE (MISCELLANEOUS) ×1
DERMABOND ADVANCED .7 DNX12 (GAUZE/BANDAGES/DRESSINGS) ×1
DRAIN HEMOVAC 7FR (DRAIN)
DRAPE HALF SHEET 40X57 (DRAPES) ×1
ELECT COATED BLADE 2.86 ST (ELECTRODE) ×1
ELECT REM PT RETURN 9FT ADLT (ELECTROSURGICAL) ×1
EVACUATOR SILICONE 100CC (DRAIN)
GAUZE 4X4 16PLY ~~LOC~~+RFID DBL (SPONGE) ×1
GLOVE BIO SURGEON STRL SZ 6.5 (GLOVE) ×1
GOWN STRL REUS W/ TWL LRG LVL3 (GOWN DISPOSABLE) ×2
GOWN STRL REUS W/TWL LRG LVL3 (GOWN DISPOSABLE) ×2
GUARD TEETH (MISCELLANEOUS)
HEMOSTAT ARISTA ABSORB 3G PWDR (HEMOSTASIS) ×1
KIT BASIN OR (CUSTOM PROCEDURE TRAY) ×1
KIT TURNOVER KIT B (KITS) ×1
NEEDLE HYPO 25GX1X1/2 BEV (NEEDLE) ×1
NEEDLE TRANS ORAL INJECTION (NEEDLE)
NS IRRIG 1000ML POUR BTL (IV SOLUTION) ×1
PAD ARMBOARD 7.5X6 YLW CONV (MISCELLANEOUS) ×2
PATTIES SURGICAL .5 X.5 (GAUZE/BANDAGES/DRESSINGS)
PATTIES SURGICAL .5 X3 (DISPOSABLE)
PENCIL SMOKE EVACUATOR (MISCELLANEOUS) ×1
POSITIONER HEAD DONUT 9IN (MISCELLANEOUS) ×1
SET WALTER ACTIVATION W/DRAPE (SET/KITS/TRAYS/PACK) ×1
SOL ANTI FOG 6CC (MISCELLANEOUS)
SPONGE INTESTINAL PEANUT (DISPOSABLE) ×1
STAPLER VISISTAT 35W (STAPLE) ×1
SURGILUBE 2OZ TUBE FLIPTOP (MISCELLANEOUS)
SUT MNCRL AB 4-0 PS2 18 (SUTURE) ×1
SUT SILK 3 0 REEL (SUTURE) ×1
SUT VIC AB 3-0 SH 27 (SUTURE) ×2
SUT VIC AB 3-0 SH 27X BRD (SUTURE) ×2
SUT VIC AB 4-0 PS2 18 (SUTURE) ×1
TOWEL GREEN STERILE FF (TOWEL DISPOSABLE) ×2
TRAY ENT MC OR (CUSTOM PROCEDURE TRAY) ×1
TUBE CONNECTING 12X1/4 (SUCTIONS) ×1

## 2022-10-04 NOTE — Transfer of Care (Signed)
Immediate Anesthesia Transfer of Care Note  Patient: Rachel Bates  Procedure(s) Performed: DIRECT LARYNGOSCOPY (Right) EXCISIONAL OF RIGHT DEEP CERVICAL LYMPH NODE (Right)  Patient Location: PACU  Anesthesia Type:General  Level of Consciousness: awake, drowsy, patient cooperative, and responds to stimulation  Airway & Oxygen Therapy: Patient Spontanous Breathing and Patient connected to nasal cannula oxygen  Post-op Assessment: Report given to RN, Post -op Vital signs reviewed and stable, and Patient moving all extremities X 4  Post vital signs: Reviewed and stable  Last Vitals:  Vitals Value Taken Time  BP 146/80 10/04/22 0945  Temp    Pulse 83 10/04/22 0946  Resp    SpO2 97 % 10/04/22 0946  Vitals shown include unvalidated device data.  Last Pain:  Vitals:   10/04/22 0609  TempSrc:   PainSc: 0-No pain         Complications:  Encounter Notable Events  Notable Event Outcome Phase Comment  Difficult to intubate - expected  Intraprocedure Filed from anesthesia note documentation.

## 2022-10-04 NOTE — H&P (Signed)
Rachel Bates is an 77 y.o. female.    Chief Complaint:  Neck mass  HPI: Patient presents today for planned elective procedure.  She denies any interval change in history since office visit on 09/12/2022:  Rachel Bates is a 77 y.o. female who presents as a return consult, referred by Sullivan Lone, MD, for evaluation and treatment of cervical adenopathy and discussion of excisional biopsy. Patient was recently seen and evaluated by oncology following core needle biopsy demonstrating non-Hodgkin's lymphoma. Unfortunately, biopsy had several crushed artifacts and final diagnosis was indeterminant. Due to significant variation in treatment, patient was referred back to otolaryngology for discussion of excisional biopsy. She reports significant discomfort in her neck and throat, as well as otalgia in the left ear. She has been taking Tylenol for her arthritis, unable to take NSAIDs. Patient is chronically anticoagulated on Eliquis, has cardiac defibrillator, follows with Dr. Lovena Le.   Past Medical History:  Diagnosis Date   AICD (automatic cardioverter/defibrillator) present dual   [employer] ---  original placedment 2007/  generator change 2014 by dr gregg taylor   Anemia    Anticoagulant long-term use    eliquis   Anxiety    Arthralgia of multiple joints    Arthritis pain    Benign hypertensive heart disease    CAD (coronary artery disease) primary cardiologist-- dr gregg taylor   MI and 2 stents 1995 in [facility])    CHF NYHA class II, chronic, systolic ([place])    followed by dr Carleene Overlie taylor   Degenerative disc disease, lumbar    Depression    Dyslipidemia    History of basal cell carcinoma (BCC) excision    right ankle area s/p  excision in office 06/ 2019  in office   History of DVT of lower extremity yrs ago before 2012   History of MI (myocardial infarction) 1995  in [place]   History of pulmonary embolus (PE) 2012   History of ventricular tachycardia    Hyperlipidemia     Hypersomnia    Hypertension    Ischemic cardiomyopathy    Migraines    Myocardial infarction ([place])    OA (osteoarthritis)    "all over"   Obstructive apnea    PAF (paroxysmal atrial fibrillation) ([place])    Primary localized osteoarthritis of right hip 12/18/2017   Primary localized osteoarthritis of right knee 05/28/2018   S/P coronary artery stent placement 1995   in [facility]   05-21-2018 per pt x2  stents in same coronary artery (unsure BM or DES)   Vitamin D deficiency disease     Past Surgical History:  Procedure Laterality Date   BIOPSY  06/19/2022   Procedure: BIOPSY;  Surgeon: Otis Brace, MD;  Location: [facility];  Service: Gastroenterology;;   CARDIAC DEFIBRILLATOR PLACEMENT  2007   CATARACT EXTRACTION, BILATERAL     COLONOSCOPY  04/14/2013   colonic polyp, status post polypectomy. Mild panocolonic diverticulosis. Small internal hemorrhoids   DILATION AND CURETTAGE OF UTERUS  yrs ago   ESOPHAGOGASTRODUODENOSCOPY (EGD) WITH PROPOFOL N/A 06/19/2022   Procedure: ESOPHAGOGASTRODUODENOSCOPY (EGD) WITH PROPOFOL;  Surgeon: Otis Brace, MD;  Location: [facility];  Service: Gastroenterology;  Laterality: N/A;   IMPACTION REMOVAL  06/19/2022   Procedure: IMPACTION REMOVAL;  Surgeon: Otis Brace, MD;  Location: [facility];  Service: Gastroenterology;;   IMPLANTABLE CARDIOVERTER DEFIBRILLATOR GENERATOR CHANGE N/A 02/04/2013   Procedure: IMPLANTABLE CARDIOVERTER DEFIBRILLATOR GENERATOR CHANGE;  Surgeon: Evans Lance, MD;  Location[facility] CATH LAB;  Service: Cardiovascular;  Laterality: N/A;   SHOULDER ARTHROSCOPY Right 2015   TOTAL HIP ARTHROPLASTY Right 12/18/2017   Procedure: RIGHT TOTAL HIP ARTHROPLASTY;  Surgeon: Marchia Bond, MD;  Location: [place];  Service: Orthopedics;  Laterality: Right;   TOTAL HIP ARTHROPLASTY Left 04/19/2021   Procedure: TOTAL HIP ARTHROPLASTY;  Surgeon: Marchia Bond, MD;  Location: [facility];  Service: Orthopedics;  Laterality: Left;    TOTAL KNEE ARTHROPLASTY Right 05/28/2018   Procedure: TOTAL KNEE ARTHROPLASTY;  Surgeon: Marchia Bond, MD;  Location: [facility];  Service: Orthopedics;  Laterality: Right;  Adductor Block   TOTAL KNEE ARTHROPLASTY Left 08/12/2021   Procedure: TOTAL KNEE ARTHROPLASTY;  Surgeon: Marchia Bond, MD;  Location: [facility];  Service: Orthopedics;  Laterality: Left;   TUBAL LIGATION Bilateral yrs ago   5 TOOTH EXTRACTION      Family History  Problem Relation Age of Onset   Hypertension Mother    Thyroid disease Mother    Alzheimer's disease Mother    Coronary artery disease Father    Pulmonary embolism Father    Congestive Heart Failure Maternal Grandmother    Hypertension Maternal Grandmother    Heart attack Maternal Grandfather    Other Maternal Grandfather        carotid disease   Dementia Paternal Grandmother    Other Paternal Grandfather 63       accident    Social History:  reports that she quit smoking about 26 years ago. Her smoking use included cigarettes. She has never used smokeless tobacco. She reports that she does not drink alcohol and does not use drugs.  Allergies:  Allergies  Allergen Reactions   Sumatriptan Succinate Other (See Comments)    Chest pain, no triptans, pt states "it makes my heart race"   Amitriptyline Other (See Comments)    Weight gain    Medications Prior to Admission  Medication Sig Dispense Refill   acetaminophen (TYLENOL) 650 MG CR tablet Take 1,300 mg by mouth 3 (three) times daily.     apixaban (ELIQUIS) 5 MG TABS tablet TAKE 1 TABLET BY MOUTH TWICE A DAY 60 tablet 5   b complex vitamins capsule Take 1 capsule by mouth daily.     Calcium Carb-Cholecalciferol (CALCIUM 600 + D PO) Take 1 tablet by mouth daily.     carvedilol (COREG) 12.5 MG tablet Take 12.5 mg by mouth 2 (two) times daily with a meal.      Cholecalciferol (VITAMIN D3) 125 MCG (5000 UT) capsule Take 5,000 Units by mouth daily.     cyanocobalamin (VITAMIN B12) 1000 MCG tablet  Take 1,000 mcg by mouth daily.     FIBER PO Take 1 capsule by mouth 2 (two) times daily.     Fiber POWD Take 1 Scoop by mouth daily as needed (constipation).     gabapentin (NEURONTIN) 300 MG capsule Take 300 mg by mouth 3 (three) times daily.     loratadine (CLARITIN) 10 MG tablet Take 10 mg by mouth daily.     Omega-3 Fatty Acids (FISH OIL) 1000 MG CAPS Take 1,000 mg by mouth daily.      pantoprazole (PROTONIX) 40 MG tablet Take 1 tablet (40 mg total) by mouth 2 (two) times daily. 180 tablet 3   ramipril (ALTACE) 2.5 MG tablet Take 2.5 mg by mouth daily.       simvastatin (ZOCOR) 40 MG tablet Take 40 mg by mouth at bedtime.       traZODone (DESYREL) 50 MG tablet Take  50 mg by mouth at bedtime.      venlafaxine (EFFEXOR) 75 MG tablet Take 75 mg by mouth daily.     vitamin E 400 UNIT capsule Take 400 Units by mouth daily.     zonisamide (ZONEGRAN) 50 MG capsule Take 50-100 mg by mouth See admin instructions. Take 50mg  by mouth in the morning and 100mg  at night.     furosemide (LASIX) 40 MG tablet Take 40 mg by mouth daily as needed for edema.     predniSONE (DELTASONE) 20 MG tablet Take 3 tablets (60 mg total) by mouth daily with breakfast. Take for 5 days starting after biopsy done. 15 tablet 0    Results for orders placed or performed during the hospital encounter of 10/04/22 (from the past 48 hour(s))  SARS Coronavirus 2 by RT PCR (hospital order, performed in [facility] hospital lab) *cepheid single result test* Anterior Nasal Swab     Status: None   Collection Time: 10/04/22  5:45 AM   Specimen: Anterior Nasal Swab  Result Value Ref Range   SARS Coronavirus 2 by RT PCR NEGATIVE NEGATIVE    Comment: Performed at [facility], [address] [place],  [address]   No results found.  ROS: ROS  Blood pressure (!) 146/70, pulse 85, temperature 98.2 F (36.8 C), temperature source Oral, resp. rate 18, height 5\' 7"  (1.702 m), weight 72.1 kg, SpO2 95 %.  PHYSICAL  EXAM: Physical Exam Constitutional:      Appearance: Normal appearance.  HENT:     Right Ear: External ear normal.     Left Ear: External ear normal.     Mouth/Throat:     Mouth: Mucous membranes are moist.  Pulmonary:     Effort: Pulmonary effort is normal.  Neurological:     Mental Status: She is alert.  Psychiatric:        Mood and Affect: Mood normal.        Behavior: Behavior normal.     Studies Reviewed: CT neck reviewed   Assessment/Plan Jacari Kolesnikov is a 77 y.o. female with extensive bilateral cervical adenopathy and asymmetric enlargement of the left tonsil and base of tongue. Patient has undergone core needle biopsy, which was consistent with lymphoproliferative process. Unfortunately, exact subtype of non-Hodgkin's lymphoma was unable to be definitively diagnosed on needle biopsy, and oncology recommended return to otolaryngology for discussion of excisional biopsy. -To OR for excisional biopsy of deep cervical lymph node with direct laryngoscopy. Risks of surgery, as well as expected postoperative course and recovery were reviewed with patient.     Ketura Sirek A Diangelo Radel 10/04/2022, 7:34 AM

## 2022-10-04 NOTE — Op Note (Signed)
OPERATIVE NOTE  Rachel Bates Date/Time of Admission: 10/04/2022  5:37 AM  CSN: [id number] Attending Provider: Jason Coop, DO Room/Bed: MCPO/NONE DOB: Apr 10, 1946 Age: 77 y.o.   Pre-Op Diagnosis: LOCALIZED ENLARGED LYMPH NODES OTHER DISEASES OF PHARYNX DIFUSE LARGE D-CELL LYMPHOMAS, LYMPH NODES OF HEAD,FACE AND NECK   Post-Op Diagnosis: LOCALIZED ENLARGED LYMPH NODES OTHER DISEASES OF PHARYNX DIFUSE LARGE D-CELL LYMPHOMAS, LYMPHNODES OF HEAD,FACE AND NECK   Procedure: Procedure(s): DIRECT LARYNGOSCOPY EXCISIONAL OF RIGHT DEEP CERVICAL LYMPH NODE  Anesthesia: General  Surgeon(s): F[place], DO  Staff: Circulator: Arnoldo Morale, RN Relief Circulator: Kennieth Francois, RN Scrub Person: Glory Buff, Filbert Berthold, RN  Implants: * No implants in log *  Specimens: ID Type Source Tests Collected by Time Destination  1 : right level 2 deep cervical lymph node Tissue PATH Lymph node excision SURGICAL PATHOLOGY Zoi Devine A, [place] [id number]     Complications: None  EBL: <10 ML  Condition: stable  Operative Findings:  Bulky adenopathy in level II on right, discrete external jugular lymph node on right. Asymmetric hypertrophy of lingual tonsil on left  Description of Operation: Once the operative consent was obtained and the site and surgery were confirmed with the patient and the operating room team, the patient was brought back to the operating room and there was smooth induction of general endotracheal anesthesia. The patient was turned over to the ENT service. Physical landmarks in the neck were identified including the angle of the mandible, lower border of the mandible, the thyroid notch, the sternal notch, and sternocleidomastoid muscle. A 6 cm incision was planned in the right lateral neck corresponding to the area of concern, incorporating a horizontal neck crease. The planned incision was infiltrated with 1% lidocaine  with 1:100,000 epinephrine. The patient was prepped and draped in sterile fashion. The planned incision was then made with a 15 blade carried through the skin, subcutaneous tissue and platysma muscle.  Subplatysmal flaps were elevated superiorly and inferiorly.  The anterior border of the sternocleidomastoid was delineated and was elevated off of the underlying lymphatic contents in an anterior to posterior direction. At the superior aspect of this dissection, the spinal accessory was identified nerve coursing in its usual direction. Contents from level 2A were freed from the nerve and reflected inferiorly.  The internal jugular vein and carotid bulb were identified and the lymphatic contents off of the lateral surface of the internal jugular vein were elevated anterior to posterior.  The spinal accessory nerve was skeletonized, and freed from surrounding lymphatic tissues.  The left level 2 neck contents were then passed off as a fresh specimen for lymphoma protocol to pathology. The wound was copiously irrigated and hemostasis was assured. A Valsalva maneuver was performed and no bleeding was noted.  Arista was placed in the surgical wound.  The platysma was then closed with interrupted 3-0 Vicryl sutures.  4-0 interrupted Vicryl sutures were placed in the subcuticular plane, and the skin was closed with a running 4-0 Monocryl suture.  Dermabond was then placed over the incision.    Attention was then turned to the laryngoscopy. An operating laryngoscope was used to directly visualize the upper airway and glottis. All anatomic areas from the oral cavity to the glottis were examined and noted to be normal with only exceptions noted in this report. Areas examined included the oropharynx, vallecula, both surfaces of the epiglottis, glottis, post cricoid region and bilateral pyriform sinuses.   The patient was placed  in laryngeal suspension with the focus on the glottis with the ET tube intact.    An oral gastric  tube was placed into the stomach and suctioned to reduce postoperative nausea. The patient was turned back over to the anesthesia service. The patient was transferred to the PACU in stable condition.    Jason Coop, [facility]  10/04/2022

## 2022-10-04 NOTE — Anesthesia Procedure Notes (Signed)
Procedure Name: Intubation Date/Time: 10/04/2022 7:53 AM  Performed by: Cathren Harsh, CRNAPre-anesthesia Checklist: Patient identified, Emergency Drugs available, Suction available and Patient being monitored Patient Re-evaluated:Patient Re-evaluated prior to induction Oxygen Delivery Method: Circle System Utilized Preoxygenation: Pre-oxygenation with 100% oxygen Induction Type: IV induction Ventilation: Mask ventilation without difficulty Laryngoscope Size: Glidescope and 3 Grade View: Grade II Tube type: Oral Tube size: 6.5 mm Number of attempts: 1 Airway Equipment and Method: Stylet and Oral airway Placement Confirmation: ETT inserted through vocal cords under direct vision, positive ETCO2 and breath sounds checked- equal and bilateral Secured at: 20 cm Tube secured with: Tape Dental Injury: Teeth and Oropharynx as per pre-operative assessment  Difficulty Due To: Difficulty was anticipated

## 2022-10-04 NOTE — Discharge Instructions (Signed)
Neck Mass Excision Post Operative Instructions Office number [phone]  The Surgery Itself Neck mass removal involves general anesthesia, typically for 1-2 hours. Patients may be quite sedated for several hours after surgery and may remain sleepy for much of the day. Nausea and vomiting are occasionally seen, and usually resolve by the evening of surgery - even without additional medications. Most patients go home the same day as surgery.  Your Incision Your incision is closed with absorbable sutures and will have a tape bandage or skin glue over the incision. You can shower and wash your hair as usual the day after surgery. You may wash in a bathtub prior to that time if you are careful not to get your neck wet. Do not soak or scrub the incision. You might notice bruising around your incision and slight swelling above the scar when you are upright. You may have numbness of the skin around the incision. In addition, the scar may become pink and hard. This hardening will peak at about 3 weeks and may result in some tightness, which will disappear over the next 2 to 3 months. You should apply sunscreen on your incision site starting 1 month after surgery EVERY day for the first year after surgery. This will prevent a red or pink scar and give you the best cosmetic result for your scar. A daily moisturizer with sunscreen (example Oil of Olay with SPF 15) is fine.  Limitations You can start resuming normal activities as tolerated 7 days after surgery. For some patients, lifting can cause pain and stretching at the surgery site for up to 3 weeks after surgery. You should not drive or drink alcohol while taking pain medications. Most people can return to work/school 1 week after surgery, but there may be physical limitations as far as what you may do while at work. Your surgeon will review your specific limitations and release you when you are ready to return to work.  Medications ?  Pain medication can be used for pain as prescribed. Pain is expected after surgery. Your neck will be sore and pain will be worse when the neck is stretched. As the surgical site heals, pain will resolve over the course of a week. Pain medications can cause nausea, which can be prevented if you take them with food or milk. ? Bacitracin ointment can be applied to the incision once the tape is removed or the glue peels off. This prevents scabbing and itching. ? Take all of your routine medications as prescribed, unless told otherwise by your surgeon. Any medications that thin the blood should be discussed with your surgeon. ? IT IS OK TO TAKE OVER THE COUNTER PAIN MEDICATION (IBUPROFEN, NAPROXEN, or ACETAMINOPHEN) IN ADDITION TO YOUR PRESCRIBED MEDICATIONS. DO NOT TAKE ASPIRIN UNLESS CLEARED WITH YOUR SURGEON. ? Limit Acetaminophen/Tylenol to less than 4,000mg/day ? Limit Ibuprofen/Motrin to less than 3,600mg/day  Pain The main complaint following neck surgery is pain with swallowing and neck movement. Some people experience a dull ache, while others feel a sharp pain. This should not keep you from eating anything you want and will improve daily after surgery.  Reasons to call your surgeon's office ? Persistent fever over 101 F ? Bleeding from the neck incision ? Increasing neck swelling ? Pain that is not relieved by your medications ? Purulent drainage (pus) from the incision ? Redness surrounding the incision that is worsening or getting bigger ? Bleeding is possible after surgery and the most serious cases may cause   trouble breathing. Symptoms include rapid swelling in the neck, trouble breathing, red and purple discoloration of the skin over the incision. Please call doctor immediately or if trouble breathing is present, go to the closest emergency room or call 911. 

## 2022-10-05 ENCOUNTER — Encounter (HOSPITAL_COMMUNITY): Payer: Self-pay | Admitting: Otolaryngology

## 2022-10-06 LAB — SURGICAL PATHOLOGY

## 2022-10-06 NOTE — Anesthesia Postprocedure Evaluation (Signed)
Anesthesia Post Note  Patient: Rachel Bates  Procedure(s) Performed: DIRECT LARYNGOSCOPY (Right) EXCISIONAL OF RIGHT DEEP CERVICAL LYMPH NODE (Right)     Patient location during evaluation: PACU Anesthesia Type: General Level of consciousness: awake and alert Pain management: pain level controlled Vital Signs Assessment: post-procedure vital signs reviewed and stable Respiratory status: spontaneous breathing, nonlabored ventilation and respiratory function stable Cardiovascular status: blood pressure returned to baseline and stable Postop Assessment: no apparent nausea or vomiting Anesthetic complications: yes   Encounter Notable Events  Notable Event Outcome Phase Comment  Difficult to intubate - expected  Intraprocedure Filed from anesthesia note documentation.    Last Vitals:  Vitals:   10/04/22 1030 10/04/22 1045  BP: (!) 122/54 (!) 113/54  Pulse: 84 79  Resp: 14 16  Temp:  36.7 C  SpO2: 95% 93%    Last Pain:  Vitals:   10/04/22 1045  TempSrc:   PainSc: 0-No pain                 Temekia Caskey

## 2022-10-11 NOTE — Progress Notes (Signed)
Remote ICD transmission.   

## 2022-10-17 ENCOUNTER — Other Ambulatory Visit: Payer: Self-pay

## 2022-10-17 DIAGNOSIS — C8511 Unspecified B-cell lymphoma, lymph nodes of head, face, and neck: Secondary | ICD-10-CM

## 2022-10-18 ENCOUNTER — Inpatient Hospital Stay (HOSPITAL_BASED_OUTPATIENT_CLINIC_OR_DEPARTMENT_OTHER): Payer: Medicare HMO | Admitting: Hematology

## 2022-10-18 ENCOUNTER — Inpatient Hospital Stay: Payer: Medicare HMO | Attending: Hematology

## 2022-10-18 VITALS — BP 118/60 | HR 84 | Temp 97.9°F | Resp 18 | Wt 163.2 lb

## 2022-10-18 DIAGNOSIS — C8331 Diffuse large B-cell lymphoma, lymph nodes of head, face, and neck: Secondary | ICD-10-CM | POA: Insufficient documentation

## 2022-10-18 DIAGNOSIS — Z79899 Other long term (current) drug therapy: Secondary | ICD-10-CM | POA: Diagnosis not present

## 2022-10-18 DIAGNOSIS — C8511 Unspecified B-cell lymphoma, lymph nodes of head, face, and neck: Secondary | ICD-10-CM

## 2022-10-18 DIAGNOSIS — Z5112 Encounter for antineoplastic immunotherapy: Secondary | ICD-10-CM | POA: Diagnosis not present

## 2022-10-18 DIAGNOSIS — Z5111 Encounter for antineoplastic chemotherapy: Secondary | ICD-10-CM | POA: Insufficient documentation

## 2022-10-18 DIAGNOSIS — Z5189 Encounter for other specified aftercare: Secondary | ICD-10-CM | POA: Insufficient documentation

## 2022-10-18 DIAGNOSIS — Z7189 Other specified counseling: Secondary | ICD-10-CM | POA: Diagnosis not present

## 2022-10-18 DIAGNOSIS — C8338 Diffuse large B-cell lymphoma, lymph nodes of multiple sites: Secondary | ICD-10-CM | POA: Diagnosis not present

## 2022-10-18 DIAGNOSIS — Z87891 Personal history of nicotine dependence: Secondary | ICD-10-CM | POA: Insufficient documentation

## 2022-10-18 LAB — CBC WITH DIFFERENTIAL (CANCER CENTER ONLY)
Abs Immature Granulocytes: 0.03 10*3/uL (ref 0.00–0.07)
Basophils Absolute: 0 10*3/uL (ref 0.0–0.1)
Basophils Relative: 0 %
Eosinophils Absolute: 0.1 10*3/uL (ref 0.0–0.5)
Eosinophils Relative: 1 %
HCT: 40.8 % (ref 36.0–46.0)
Hemoglobin: 13.5 g/dL (ref 12.0–15.0)
Immature Granulocytes: 1 %
Lymphocytes Relative: 15 %
Lymphs Abs: 0.8 10*3/uL (ref 0.7–4.0)
MCH: 28.1 pg (ref 26.0–34.0)
MCHC: 33.1 g/dL (ref 30.0–36.0)
MCV: 85 fL (ref 80.0–100.0)
Monocytes Absolute: 0.6 10*3/uL (ref 0.1–1.0)
Monocytes Relative: 11 %
Neutro Abs: 4.1 10*3/uL (ref 1.7–7.7)
Neutrophils Relative %: 72 %
Platelet Count: 120 10*3/uL — ABNORMAL LOW (ref 150–400)
RBC: 4.8 MIL/uL (ref 3.87–5.11)
RDW: 16.5 % — ABNORMAL HIGH (ref 11.5–15.5)
WBC Count: 5.7 10*3/uL (ref 4.0–10.5)
nRBC: 0 % (ref 0.0–0.2)

## 2022-10-18 LAB — CMP (CANCER CENTER ONLY)
ALT: 16 U/L (ref 0–44)
AST: 21 U/L (ref 15–41)
Albumin: 4.2 g/dL (ref 3.5–5.0)
Alkaline Phosphatase: 75 U/L (ref 38–126)
Anion gap: 5 (ref 5–15)
BUN: 12 mg/dL (ref 8–23)
CO2: 29 mmol/L (ref 22–32)
Calcium: 9.8 mg/dL (ref 8.9–10.3)
Chloride: 105 mmol/L (ref 98–111)
Creatinine: 0.91 mg/dL (ref 0.44–1.00)
GFR, Estimated: >60 mL/min (ref >60)
Glucose, Bld: 74 mg/dL (ref 70–99)
Potassium: 4.1 mmol/L (ref 3.5–5.1)
Sodium: 139 mmol/L (ref 135–145)
Total Bilirubin: 0.4 mg/dL (ref 0.3–1.2)
Total Protein: 6.7 g/dL (ref 6.5–8.1)

## 2022-10-18 LAB — LACTATE DEHYDROGENASE: LDH: 168 U/L (ref 98–192)

## 2022-10-18 NOTE — Progress Notes (Signed)
HEMATOLOGY/ONCOLOGY CLINIC NOTE  Date of Service: 10/18/2022  Patient Care Team: Eunice Blase'Buch, Greta, PA-C as PCP - General (Internal Medicine) Marinus Mawaylor, Gregg W, MD as PCP - Cardiology (Cardiology) Ebbie RidgeShevlin, Patricia A, MD (Family Medicine) Johney MaineKale, Danyela Posas Kishore, MD as Consulting Physician (Hematology)  CHIEF COMPLAINTS/PURPOSE OF CONSULTATION:  Management of newly diagnosed Diffuse large B cell lymphoma   HISTORY OF PRESENTING ILLNESS:  Rachel Bates is a wonderful 77 y.o. female who has been referred to us by Carylon PerchesSkotnicki, Meghan Ann, DO for evaluation and management of non hodgkin's lymphoma. She has a hx of chronic systolic heart failure.  Nasolaryngoscopy revealed a mass lesion of the left oropharynx, emanating from the left base of tongue/glossotonsillar sulcus. Patient subsequently had a CT neck with contrast performed on 08/09/2022, which reported extensive bilateral adenopaty, with greatest lymph node conglomerate measuring 3.2 x 2.4 cm in the right level 2. Patient underwent core needle biopsy on 08/18/22 and endorsed persistent throat pain and odynophagia at the time. She is a former smoker and quit in the late 90s. SHe endorsed heavy tobacco use prior to quitting, with 1.5 pack/day for about 30 years.   Today, she is accompanied by her daughter. She complains of a sore throat beginning around Thanksgiving time. She was given antibiotics and prednisone which did not improve symptoms. She endorses swelling in her neck with associated soreness which came quickly past 2-3 weeks. She reports that her neck edema has recently grown on both the left and right sides. Her neck pain is in a specific area. She also complains of recent her neck pain if she turns her neck.  She did present Emergency EMT because something was stuck in her throat. Endoscopy revealed that it was food and it was pushed down. An ulcer was also found in her esophagus and some narrowing which will be stretched in three months.  She has not had any issues with swallowing food since then. She has not had a biopsy of issue on the back of her tongue. No other lumps/bumps, unexplained fevers, chills, night sweat, change in breathing, abdominal pain, or skin rashes. She reports discomfort with drinking tea which caused her to eat less as well.   She is allergic to Sumatriptan and another medication she cannot recall. She does not consume alcohol and has quit smoking in the 90s. 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 She believes the sore was caused by shoe discomfort.  She denies any facial puffiness, abdominal pain, recent change in bowel habits or urination. She reports that her defibrillator has never gone off.   She reports a ganglion cyst on her right knee, 2 knee replacements, and 2 hip replacements. She denies a Fhx of blood disorders. However, her sister has lung cancer and was a frequent smoker.  She is UTD with her vaccinations, incuding influenza, RSV, and COVID-19 booster. She has endorsed migraines since childhood. She lives on her own is able to complete daily activities independently. She will follow-up with Dr. Marene LenzSkotnicki soon. She follows up with cardiologist regularly. Her a fib, pacemaker, and ICD device have been stable. She has not had a recent echo.   INTERVAL HISTORY:  Rachel AlpersSusan K Vasco is a 77 y.o. female who presents today for follow up and management of diffuse large B-cell lymphoma. She was last seen by me for consult on 08/31/22.  She had a deep cervical LN biopsies on  10/04/22. 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 We thoroughly discussed these results today. We also reviewed her NM PET results from 09/04/22.  Today, she states that she has had increased right-sided neck swelling. She denies any new symptoms. She has occasional throat pain but denies any  trouble swallowing. She noticed some relief of swelling with 5 day steroid use- she completed this on 09/05/22. She states that she has been eating and drinking okay. She denies any changes in bowel or urinary habits.   MEDICAL HISTORY:  Past Medical History:  Diagnosis Date   AICD (automatic cardioverter/defibrillator) present dual   [employer] ---  original placedment 2007/  generator change 2014 by dr gregg taylor   Anemia    Anticoagulant long-term use    eliquis   Anxiety    Arthralgia of multiple joints    Arthritis pain    Benign hypertensive heart disease    CAD (coronary artery disease) primary cardiologist-- dr gregg taylor   MI and 2 stents 1995 in [place]   Cancer [facility])    CHF NYHA class II, chronic, systolic (HCC)    followed by dr Sharlot Gowda taylor   Degenerative disc disease, lumbar    Depression    Dyslipidemia    History of basal cell carcinoma (BCC) excision    right ankle area s/p  excision in office 06/ 2019  in office   History of DVT of lower extremity yrs ago before 2012   History of MI (myocardial infarction) 1995  in [place]   History of pulmonary embolus (PE) 2012   History of ventricular tachycardia    Hyperlipidemia    Hypersomnia    Hypertension    Ischemic cardiomyopathy    Migraines    Myocardial infarction (HCC)    OA (osteoarthritis)    "all over"   Obstructive apnea    PAF (paroxysmal atrial fibrillation) (HCC)    Primary localized osteoarthritis of right hip 12/18/2017   Primary localized osteoarthritis of right knee 05/28/2018   S/P coronary artery stent placement 1995   in [place] [facility]   05-21-2018 per pt x2  stents in same coronary artery (unsure BM or DES)   Vitamin D deficiency disease     SURGICAL HISTORY: Past Surgical History:  Procedure Laterality Date   BIOPSY  06/19/2022   Procedure: BIOPSY;  Surgeon: Kathi Der, MD;  Location: [facility] ENDOS[facility];  Service: Gastroenterology;;   CARDIAC DEFIBRILLATOR PLACEMENT  2007   CATARACT  EXTRACTION, BILATERAL     COLONOSCOPY  04/14/2013   colonic polyp, status post polypectomy. Mild panocolonic diverticulosis. Small internal hemorrhoids   DILATION AND CURETTAGE OF UTERUS  yrs ago   DIRECT LARYNGOSCOPY Right 10/04/2022   Procedure: DIRECT LARYNGOSCOPY;  Surgeon: Cheron Schaumann A, DO;  Location: [facility] OR;  Service: ENT;  Laterality: Right;   ESOPHAGOGASTRODUODENOSCOPY (EGD) WITH PROPOFOL N/A 06/19/2022   Procedure: ESOPHAGOGASTRODUODENOSCOPY (EGD) WITH PROPOFOL;  Surgeon: Kathi Der, MD;  Location: [facility] END[facility];  Service: Gastroenterology;  Laterality: N/A;   IMPACTION REMOVAL  06/19/2022   Procedure: IMPACTION REMOVAL;  Surgeon: Kathi Der, MD;  Location: [facility];  Service: Gastroenterology;;   IMPLANTABLE CARDIOVERTER DEFIBRILLATOR GENERATOR CHANGE N/A 02/04/2013   Procedure: IMPLANTABLE CARDIOVERTER DEFIBRILLATOR GENERATOR CHANGE;  Surgeon: Marinus Maw, MD;  Location: [facility] CATH LAB;  Service: Cardiovascular;  Laterality: N/A;   LYMPH NODE BIOPSY Right 10/04/2022   Procedure: EXCISIONAL OF RIGHT DEEP CERVICAL LYMPH NODE;  Surgeon: Laren Boom, DO;  Location: [facility]  OR;  Service: ENT;  Laterality: Right;   SHOULDER ARTHROSCOPY Right 2015   TOTAL HIP ARTHROPLASTY Right 12/18/2017   Procedure: RIGHT TOTAL HIP ARTHROPLASTY;  Surgeon: Teryl Lucy, MD;  Location: [facility] OR;  Service: Orthopedics;  Laterality: Right;   TOTAL HIP ARTHROPLASTY Left 04/19/2021   Procedure: TOTAL HIP ARTHROPLASTY;  Surgeon: Teryl Lucy, MD;  Location: [facility] ORS;  Service: Orthopedics;  Laterality: Left;   TOTAL KNEE ARTHROPLASTY Right 05/28/2018   Procedure: TOTAL KNEE ARTHROPLASTY;  Surgeon: Teryl Lucy, MD;  Location: [facility] ORS;  Service: Orthopedics;  Laterality: Right;  Adductor Block   TOTAL KNEE ARTHROPLASTY Left 08/12/2021   Procedure: TOTAL KNEE ARTHROPLASTY;  Surgeon: Teryl Lucy, MD;  Location: [facility] ORS;  Service: Orthopedics;  Laterality: Left;   TUBAL LIGATION Bilateral yrs  ago   WISDOM TOOTH EXTRACTION      SOCIAL HISTORY: Social History   Socioeconomic History   Marital status: Single    Spouse name: Not on file   Number of children: Not on file   Years of education: Not on file   Highest education level: Not on file  Occupational History   Not on file  Tobacco Use   Smoking status: Former    Years: 30    Types: Cigarettes    Quit date: 05/21/1996    Years since quitting: 26.4   Smokeless tobacco: Never  Vaping Use   Vaping Use: Never used  Substance and Sexual Activity   Alcohol use: No   Drug use: Never   Sexual activity: Not on file  Other Topics Concern   Not on file  Social History Narrative   Not on file   Social Determinants of Health   Financial Resource Strain: Low Risk  (05/28/2018)   Overall Financial Resource Strain (CARDIA)    Difficulty of Paying Living Expenses: Not hard at all  Food Insecurity: No Food Insecurity (05/28/2018)   Hunger Vital Sign    Worried About Running Out of Food in the Last Year: Never true    Ran Out of Food in the Last Year: Never true  Transportation Needs: No Transportation Needs (05/28/2018)   PRAPARE - Administrator, Civil Service (Medical): No    Lack of Transportation (Non-Medical): No  Physical Activity: Not on file  Stress: Not on file  Social Connections: Not on file  Intimate Partner Violence: Not on file    FAMILY HISTORY: Family History  Problem Relation Age of Onset   Hypertension Mother    Thyroid disease Mother    Alzheimer's disease Mother    Coronary artery disease Father    Pulmonary embolism Father    Congestive Heart Failure Maternal Grandmother    Hypertension Maternal Grandmother    Heart attack Maternal Grandfather    Other Maternal Grandfather        carotid disease   Dementia Paternal Grandmother    Other Paternal Grandfather 43       accident    ALLERGIES:  is allergic to sumatriptan succinate and amitriptyline.  MEDICATIONS:  Current  Outpatient Medications  Medication Sig Dispense Refill   acetaminophen (TYLENOL) 650 MG CR tablet Take 1,300 mg by mouth 3 (three) times daily.     apixaban (ELIQUIS) 5 MG TABS tablet TAKE 1 TABLET BY MOUTH TWICE A DAY 60 tablet 5   b complex vitamins capsule Take 1 capsule by mouth daily.     Calcium Carb-Cholecalciferol (CALCIUM 600 + D PO) Take 1 tablet by mouth daily.  carvedilol (COREG) 12.5 MG tablet Take 12.5 mg by mouth 2 (two) times daily with a meal.      Cholecalciferol (VITAMIN D3) 125 MCG (5000 UT) capsule Take 5,000 Units by mouth daily.     cyanocobalamin (VITAMIN B12) 1000 MCG tablet Take 1,000 mcg by mouth daily.     FIBER PO Take 1 capsule by mouth 2 (two) times daily.     Fiber POWD Take 1 Scoop by mouth daily as needed (constipation).     furosemide (LASIX) 40 MG tablet Take 40 mg by mouth daily as needed for edema.     gabapentin (NEURONTIN) 300 MG capsule Take 300 mg by mouth 3 (three) times daily.     loratadine (CLARITIN) 10 MG tablet Take 10 mg by mouth daily.     Omega-3 Fatty Acids (FISH OIL) 1000 MG CAPS Take 1,000 mg by mouth daily.      pantoprazole (PROTONIX) 40 MG tablet Take 1 tablet (40 mg total) by mouth 2 (two) times daily. 180 tablet 3   predniSONE (DELTASONE) 20 MG tablet Take 3 tablets (60 mg total) by mouth daily with breakfast. Take for 5 days starting after biopsy done. 15 tablet 0   ramipril (ALTACE) 2.5 MG tablet Take 2.5 mg by mouth daily.       simvastatin (ZOCOR) 40 MG tablet Take 40 mg by mouth at bedtime.       traZODone (DESYREL) 50 MG tablet Take 50 mg by mouth at bedtime.      venlafaxine (EFFEXOR) 75 MG tablet Take 75 mg by mouth daily.     vitamin E 400 UNIT capsule Take 400 Units by mouth daily.     zonisamide (ZONEGRAN) 50 MG capsule Take 50-100 mg by mouth See admin instructions. Take 50mg  by mouth in the morning and 100mg  at night.     No current facility-administered medications for this visit.    REVIEW OF SYSTEMS:   10 Point  review of Systems was done is negative except as noted above.  PHYSICAL EXAMINATION: ECOG PERFORMANCE STATUS: 1 - Symptomatic but completely ambulatory  Vitals:   10/18/22 1310  BP: 118/60  Pulse: 84  Resp: 18  Temp: 97.9 F (36.6 C)  SpO2: 96%    Filed Weights   10/18/22 1310  Weight: 163 lb 3.2 oz (74 kg)    Body mass index is 25.56 kg/m.  GENERAL:alert, in no acute distress and comfortable SKIN: no acute rashes, no significant lesions EYES: conjunctiva are pink and non-injected, sclera anicteric OROPHARYNX: MMM, no exudates, no oropharyngeal erythema or ulceration NECK: supple, no JVD LYMPH:  b/l cervical lymphadenopathy- biopsy incision well-healed  LUNGS: clear to auscultation b/l with normal respiratory effort HEART: regular rate & rhythm ABDOMEN:  normoactive bowel sounds , non tender, not distended. Extremity: no pedal edema PSYCH: alert & oriented x 3 with fluent speech NEURO: no focal motor/sensory deficits  LABORATORY DATA:  I have reviewed the data as listed    Latest Ref Rng & Units 10/18/2022   12:09 PM 09/28/2022   11:24 AM 08/31/2022   11:04 AM  CBC  WBC 4.0 - 10.5 K/uL 5.7  3.8  4.0   Hemoglobin 12.0 - 15.0 g/dL 16.1  09.6  04.5   Hematocrit 36.0 - 46.0 % 40.8  42.9  38.2   Platelets 150 - 400 K/uL 120  105  125       Latest Ref Rng & Units 10/18/2022   12:09 PM 09/28/2022   11:24 AM  08/31/2022   11:04 AM  CMP  Glucose 70 - 99 mg/dL 74  409  811   BUN 8 - 23 mg/dL 12  11  10    Creatinine 0.44 - 1.00 mg/dL 9.14  7.82  9.56   Sodium 135 - 145 mmol/L 139  139  141   Potassium 3.5 - 5.1 mmol/L 4.1  4.0  3.9   Chloride 98 - 111 mmol/L 105  105  108   CO2 22 - 32 mmol/L 29  25  28    Calcium 8.9 - 10.3 mg/dL 9.8  9.2  8.6   Total Protein 6.5 - 8.1 g/dL 6.7   6.4   Total Bilirubin 0.3 - 1.2 mg/dL 0.4   0.5   Alkaline Phos 38 - 126 U/L 75   72   AST 15 - 41 U/L 21   17   ALT 0 - 44 U/L 16   11    Lab Results  Component Value Date   LDH 168  10/18/2022    PATHOLOGY 10/04/22: A. LYMPH NODE, RIGHT LEVEL 2 DEEP CERVICAL, EXCISION:  -Diffuse large B-cell lymphoma  -See comment  COMMENT:  The sections show diffuse effacement of the lymph nodal architecture primarily by a population of large lymphoid cells characterized by vesicular chromatin and small nucleoli associated with apoptosis and brisk mitosis.  In some areas, the large atypical lymphoid proliferation extends into the perinodal adipose tissue.  In this background, there are scattered variably sized and somewhat disrupted aggregates of primarily small lymphoid cells characterized by high nuclear cytoplasmic ratio, angulated nuclear contours and small to inconspicuous nucleoli. Flow cytometric analysis was performed [facility] [id number]) and shows a monoclonal, lambda restricted B-cell population expressing CD10.  In addition, immunohistochemical stains for CD3, CD5, CD10, CD20, PAX5, BCL6, Bcl-2, Ki-67, CD30, CD138, CD21, EBV in addition to in situ hybridization for kappa and lambda were performed with appropriate controls.  The large lymphoid cells are positive for CD20, PAX5, CD10, BCL6, Bcl-2, and partially for cytoplasmic lambda.  No significant staining is seen with EBV, CD30, CD138 or cytoplasmic kappa.  Ki67 shows variably increased expression (more than 50% in some areas). CD21 highlights scattered somewhat disrupted follicular dendritic networks. The lymphoid aggregates of primarily small lymphoid cells previously described show positivity for B-cell markers CD20 and PAX5 in addition to CD10 and Bcl-2.  There is an admixed variable T-cell component in the background as seen with CD3 and CD5 and there is no apparent co-expression of CD5 in B-cell areas.  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   RADIOGRAPHIC STUDIES: I have  personally reviewed the radiological images as listed and agreed with the findings in the report. CUP PACEART REMOTE DEVICE CHECK  Result Date: 10/04/2022 Scheduled non-billable remote reviewed. Normal device function.  Battery estimated 88mo Next remote 4/29 LA, CVRS   NM PET 09/04/2022: IMPRESSION: 1. There is a large tracer avid mass centered within the left base of tongue and left lingual region which extends into the hypopharyngeal region ventral to the epiglottis. Imaging findings are compatible with a primary head and neck malignancy. 2. A smaller focus of increased uptake localizes to the right pharyngeal tonsil region. Indeterminate. 3. Bilateral tracer avid cervical and supraclavicular lymph nodes compatible with nodal metastasis. 4. There is a 7 mm tracer avid nodule within the anteromedial left upper lobe which is suspicious for pulmonary  metastasis. 5. There is increased uptake identified along the long axis of the posterior right seventh rib. No corresponding lytic or sclerotic changes identified on the CT images. Cannot exclude bone metastases. 6. Multiple prominent retroperitoneal and mesenteric lymph nodes are identified which exhibit mild tracer uptake. This is a nonspecific finding and may reflect reactive adenopathy. Metastatic adenopathy cannot be excluded. Attention on future surveillance imaging is advised. 7.  Aortic Atherosclerosis (ICD10-I70.0).   ASSESSMENT & PLAN:  77 y.o.  female with:   1. 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 2. 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 -We reviewed this diagnosis thoroughly. -Reviewed biopsy and NM PET results with pt and her daughter today. -Reviewed labs with pt in detail, CBC and CMP WNL. LDL WNL at 168. -Discussed combined chemotherapy immunotherapy treatment plan with patient and daughter as well as possible side effects: R-CEOP--without doxorubicin given her  CHF history, substituting Etoposide instead. -Will get an echocardiogram as it has been many years since her last echo- order placed for echo today. -Discussed Prednisone regimen with treatment. 60 mg daily x 5 days -Will send referral today for port placement. -chemo-counseling for R-CEOP -Will need high risk lymphoma FISH testing on her biopsy specimen to rule out double hit lymphoma.  FOLLOW UP: Port-A-Cath placement by IR as soon as possible Echocardiogram soon as possible Chemo counseling for R-CEOP Scheduled to start R-CEOP in 1 week  The total time spent in the appointment was 41 minutes* .  All of the patient's questions were answered with apparent satisfaction. The patient knows to call the clinic with any problems, questions or concerns.   Wyvonnia Lora MD MS AAHIVMS [facility] Hematology/Oncology Physician [facility]  .*Total Encounter Time as defined by the Centers for Medicare and Medicaid Services includes, in addition to the face-to-face time of a patient visit (documented in the note above) non-face-to-face time: obtaining and reviewing outside history, ordering and reviewing medications, tests or procedures, care coordination (communications with other health care professionals or caregivers) and documentation in the medical record.   I,Alexis Herring,acting as a Neurosurgeon for Wyvonnia Lora, MD.,have documented all relevant documentation on the behalf of Wyvonnia Lora, MD,as directed by  Wyvonnia Lora, MD while in the presence of Wyvonnia Lora, MD.  .I have reviewed the above documentation for accuracy and completeness, and I  agree with the above. Johney Maine MD

## 2022-10-19 ENCOUNTER — Encounter: Payer: Self-pay | Admitting: Hematology

## 2022-10-19 DIAGNOSIS — Z7189 Other specified counseling: Secondary | ICD-10-CM | POA: Insufficient documentation

## 2022-10-19 DIAGNOSIS — C8338 Diffuse large B-cell lymphoma, lymph nodes of multiple sites: Secondary | ICD-10-CM | POA: Insufficient documentation

## 2022-10-19 MED ORDER — PREDNISONE 20 MG PO TABS
60.0000 mg | ORAL_TABLET | Freq: Every day | ORAL | 6 refills | Status: DC
Start: 2022-10-19 — End: 2023-07-24

## 2022-10-19 MED ORDER — ALLOPURINOL 300 MG PO TABS
150.0000 mg | ORAL_TABLET | Freq: Every day | ORAL | 0 refills | Status: DC
Start: 2022-10-19 — End: 2022-12-11

## 2022-10-19 MED ORDER — PROCHLORPERAZINE MALEATE 10 MG PO TABS
10.0000 mg | ORAL_TABLET | Freq: Four times a day (QID) | ORAL | 6 refills | Status: DC | PRN
Start: 2022-10-19 — End: 2023-02-16

## 2022-10-19 MED ORDER — ACYCLOVIR 400 MG PO TABS
400.0000 mg | ORAL_TABLET | Freq: Two times a day (BID) | ORAL | 5 refills | Status: DC
Start: 2022-10-19 — End: 2023-01-15

## 2022-10-19 MED ORDER — LIDOCAINE-PRILOCAINE 2.5-2.5 % EX CREA
TOPICAL_CREAM | CUTANEOUS | 3 refills | Status: DC
Start: 2022-10-19 — End: 2023-02-16

## 2022-10-19 MED ORDER — ONDANSETRON HCL 8 MG PO TABS
8.0000 mg | ORAL_TABLET | Freq: Three times a day (TID) | ORAL | 1 refills | Status: DC | PRN
Start: 2022-10-19 — End: 2023-02-16

## 2022-10-19 NOTE — Progress Notes (Signed)
START ON PATHWAY REGIMEN - Lymphoma and CLL     A cycle is every 21 days:     Prednisone      Rituximab-xxxx      Cyclophosphamide      Etoposide      Vincristine      Etoposide   **Always confirm dose/schedule in your pharmacy ordering system**  Patient Characteristics: Diffuse Large B-Cell Lymphoma or Follicular Lymphoma, Grade 3B, First Line, Stage III and IV Disease Type: Not Applicable Disease Type: Diffuse Large B-Cell Lymphoma Disease Type: Not Applicable Line of therapy: First Line Intent of Therapy: Curative Intent, Discussed with Patient 

## 2022-10-20 ENCOUNTER — Telehealth: Payer: Self-pay

## 2022-10-20 ENCOUNTER — Other Ambulatory Visit: Payer: Self-pay

## 2022-10-20 DIAGNOSIS — C8331 Diffuse large B-cell lymphoma, lymph nodes of head, face, and neck: Secondary | ICD-10-CM | POA: Diagnosis not present

## 2022-10-20 DIAGNOSIS — R590 Localized enlarged lymph nodes: Secondary | ICD-10-CM | POA: Diagnosis not present

## 2022-10-20 NOTE — Telephone Encounter (Signed)
Returned call to pt regarding starting  allopurinol and acyclovir . Pt instructed to start 3 to 4 days prior to her first tx. Pt acknowledged information and verbalized understanding.

## 2022-10-24 ENCOUNTER — Inpatient Hospital Stay: Payer: Medicare HMO

## 2022-10-24 NOTE — Progress Notes (Signed)
Pharmacist Chemotherapy Monitoring - Initial Assessment    Anticipated start date: 10/31/22   The following has been reviewed per standard work regarding the patient's treatment regimen: The patient's diagnosis, treatment plan and drug doses, and organ/hematologic function Lab orders and baseline tests specific to treatment regimen  The treatment plan start date, drug sequencing, and pre-medications Prior authorization status  Patient's documented medication list, including drug-drug interaction screen and prescriptions for anti-emetics and supportive care specific to the treatment regimen The drug concentrations, fluid compatibility, administration routes, and timing of the medications to be used The patient's access for treatment and lifetime cumulative dose history, if applicable  The patient's medication allergies and previous infusion related reactions, if applicable   Changes made to treatment plan:  N/A  Follow up needed:  N/A   Rachel Bates, RPH, 10/24/2022  3:38 PM

## 2022-10-26 ENCOUNTER — Other Ambulatory Visit: Payer: Self-pay | Admitting: Radiology

## 2022-10-27 ENCOUNTER — Other Ambulatory Visit: Payer: Self-pay

## 2022-10-27 ENCOUNTER — Encounter (HOSPITAL_COMMUNITY): Payer: Self-pay

## 2022-10-27 ENCOUNTER — Ambulatory Visit (HOSPITAL_COMMUNITY)
Admission: RE | Admit: 2022-10-27 | Discharge: 2022-10-27 | Disposition: A | Discharge status: Home / Self Care | Payer: Medicare HMO | Source: Ambulatory Visit | Attending: Hematology | Admitting: Hematology

## 2022-10-27 ENCOUNTER — Encounter (HOSPITAL_COMMUNITY): Payer: Self-pay | Admitting: Hematology

## 2022-10-27 DIAGNOSIS — G4733 Obstructive sleep apnea (adult) (pediatric): Secondary | ICD-10-CM | POA: Diagnosis not present

## 2022-10-27 DIAGNOSIS — D649 Anemia, unspecified: Secondary | ICD-10-CM | POA: Insufficient documentation

## 2022-10-27 DIAGNOSIS — Z955 Presence of coronary angioplasty implant and graft: Secondary | ICD-10-CM | POA: Insufficient documentation

## 2022-10-27 DIAGNOSIS — I480 Paroxysmal atrial fibrillation: Secondary | ICD-10-CM | POA: Insufficient documentation

## 2022-10-27 DIAGNOSIS — E785 Hyperlipidemia, unspecified: Secondary | ICD-10-CM | POA: Insufficient documentation

## 2022-10-27 DIAGNOSIS — I2510 Atherosclerotic heart disease of native coronary artery without angina pectoris: Secondary | ICD-10-CM | POA: Insufficient documentation

## 2022-10-27 DIAGNOSIS — I110 Hypertensive heart disease with heart failure: Secondary | ICD-10-CM | POA: Diagnosis not present

## 2022-10-27 DIAGNOSIS — C8338 Diffuse large B-cell lymphoma, lymph nodes of multiple sites: Secondary | ICD-10-CM | POA: Diagnosis not present

## 2022-10-27 DIAGNOSIS — I255 Ischemic cardiomyopathy: Secondary | ICD-10-CM | POA: Insufficient documentation

## 2022-10-27 DIAGNOSIS — I252 Old myocardial infarction: Secondary | ICD-10-CM | POA: Insufficient documentation

## 2022-10-27 DIAGNOSIS — C8590 Non-Hodgkin lymphoma, unspecified, unspecified site: Secondary | ICD-10-CM | POA: Diagnosis not present

## 2022-10-27 DIAGNOSIS — F419 Anxiety disorder, unspecified: Secondary | ICD-10-CM | POA: Insufficient documentation

## 2022-10-27 DIAGNOSIS — I509 Heart failure, unspecified: Secondary | ICD-10-CM | POA: Insufficient documentation

## 2022-10-27 DIAGNOSIS — E559 Vitamin D deficiency, unspecified: Secondary | ICD-10-CM | POA: Diagnosis not present

## 2022-10-27 DIAGNOSIS — F32A Depression, unspecified: Secondary | ICD-10-CM | POA: Insufficient documentation

## 2022-10-27 DIAGNOSIS — Z452 Encounter for adjustment and management of vascular access device: Secondary | ICD-10-CM | POA: Diagnosis not present

## 2022-10-27 HISTORY — PX: IR IMAGING GUIDED PORT INSERTION: IMG5740

## 2022-10-27 MED ORDER — LIDOCAINE-EPINEPHRINE 1 %-1:100000 IJ SOLN
INTRAMUSCULAR | Status: AC
  Filled 2022-10-27: qty 1

## 2022-10-27 MED ORDER — FENTANYL CITRATE (PF) 100 MCG/2ML IJ SOLN
INTRAMUSCULAR | Status: AC | PRN
  Administered 2022-10-27 (×2): 50 ug via INTRAVENOUS

## 2022-10-27 MED ORDER — HEPARIN SOD (PORK) LOCK FLUSH 100 UNIT/ML IV SOLN
500.0000 [IU] | Freq: Once | INTRAVENOUS | Status: AC
  Administered 2022-10-27: 500 [IU] via INTRAVENOUS

## 2022-10-27 MED ORDER — SODIUM CHLORIDE 0.9 % IV SOLN: INTRAVENOUS | Status: DC

## 2022-10-27 MED ORDER — MIDAZOLAM HCL 2 MG/2ML IJ SOLN
INTRAMUSCULAR | Status: AC
  Filled 2022-10-27: qty 2

## 2022-10-27 MED ORDER — MIDAZOLAM HCL 2 MG/2ML IJ SOLN
INTRAMUSCULAR | Status: AC | PRN
  Administered 2022-10-27 (×2): 1 mg via INTRAVENOUS

## 2022-10-27 MED ORDER — FENTANYL CITRATE (PF) 100 MCG/2ML IJ SOLN
INTRAMUSCULAR | Status: AC
  Filled 2022-10-27: qty 2

## 2022-10-27 MED ORDER — LIDOCAINE-EPINEPHRINE (PF) 2 %-1:200000 IJ SOLN
20.0000 mL | Freq: Once | INTRAMUSCULAR | Status: AC
  Administered 2022-10-27: 20 mL via INTRADERMAL

## 2022-10-27 MED ORDER — HEPARIN SOD (PORK) LOCK FLUSH 100 UNIT/ML IV SOLN
INTRAVENOUS | Status: AC
  Filled 2022-10-27: qty 5

## 2022-10-27 NOTE — Progress Notes (Signed)
Patient arrived to short stay for port placement with IR and stated she fell this morning and hit the right side of her head.  Patient states she did not lose consciousness,  denies headache or any other symptoms.  Jeananne Rama, PA notified.

## 2022-10-27 NOTE — Discharge Instructions (Signed)

## 2022-10-27 NOTE — Procedures (Signed)
Pre Procedure Dx: Poor venous access Post Procedural Dx: Same  Successful placement of right IJ approach port-a-cath with tip at the superior caval atrial junction. The catheter is ready for immediate use.  Estimated Blood Loss: Trace  Complications: None immediate.  Jay Jasma Seevers, MD Pager #: [phone]   

## 2022-10-27 NOTE — H&P (Signed)
Referring Physician(s): Johney Maine  Supervising Physician: Simonne Come  Patient Status:  [facility]  Chief Complaint:  "I'm getting a port a cath"  Subjective: Pt known to IR team from right cervical lymph node biopsy on 08/14/22. She has a PMH of AICD, anemia, anxiety/depression, CAD with MI/stenting, CHF, DDD, HLD, skin cancer, LE DVT,  PE, HTN, ICM, OSA, PAF, vit D def who presents now with newly diagnosed diffuse large B cell lymphoma. She is scheduled today for port a cath placement to assist with treatment. She denies fever,HA,CP,dyspnea, cough, abd pain, N/V or bleeding. She did fall at home this am and has some mild rt buttocks/hip pain, small left forehead bruise.   Past Medical History:  Diagnosis Date   AICD (automatic cardioverter/defibrillator) present dual   [employer] ---  original placedment 2007/  generator change 2014 by dr gregg taylor   Anemia    Anticoagulant long-term use    eliquis   Anxiety    Arthralgia of multiple joints    Arthritis pain    Benign hypertensive heart disease    CAD (coronary artery disease) primary cardiologist-- dr gregg taylor   MI and 2 stents 1995 in [place]   Cancer [facility])    CHF NYHA class II, chronic, systolic (HCC)    followed by dr Sharlot Gowda taylor   Degenerative disc disease, lumbar    Depression    Dyslipidemia    History of basal cell carcinoma (BCC) excision    right ankle area s/p  excision in office 06/ 2019  in office   History of DVT of lower extremity yrs ago before 2012   History of MI (myocardial infarction) 1995  in [place]   History of pulmonary embolus (PE) 2012   History of ventricular tachycardia    Hyperlipidemia    Hypersomnia    Hypertension    Ischemic cardiomyopathy    Migraines    Myocardial infarction (HCC)    OA (osteoarthritis)    "all over"   Obstructive apnea    PAF (paroxysmal atrial fibrillation) (HCC)    Primary localized osteoarthritis of right hip 12/18/2017   Primary localized  osteoarthritis of right knee 05/28/2018   S/P coronary artery stent placement 1995   in [facility]   05-21-2018 per pt x2  stents in same coronary artery (unsure BM or DES)   Vitamin D deficiency disease    Past Surgical History:  Procedure Laterality Date   BIOPSY  06/19/2022   Procedure: BIOPSY;  Surgeon: Kathi Der, MD;  Location: [facility];  Service: Gastroenterology;;   CARDIAC DEFIBRILLATOR PLACEMENT  2007   CATARACT EXTRACTION, BILATERAL     COLONOSCOPY  04/14/2013   colonic polyp, status post polypectomy. Mild panocolonic diverticulosis. Small internal hemorrhoids   DILATION AND CURETTAGE OF UTERUS  yrs ago   DIRECT LARYNGOSCOPY Right 10/04/2022   Procedure: DIRECT LARYNGOSCOPY;  Surgeon: Cheron Schaumann A, DO;  Location: [facility] OR;  Service: ENT;  Laterality: Right;   ESOPHAGOGASTRODUODENOSCOPY (EGD) WITH PROPOFOL N/A 06/19/2022   Procedure: ESOPHAGOGASTRODUODENOSCOPY (EGD) WITH PROPOFOL;  Surgeon: Kathi Der, MD;  Location: [facility];  Service: Gastroenterology;  Laterality: N/A;   IMPACTION REMOVAL  06/19/2022   Procedure: IMPACTION REMOVAL;  Surgeon: Kathi Der, MD;  Location: [facility];  Service: Gastroenterology;;   IMPLANTABLE CARDIOVERTER DEFIBRILLATOR GENERATOR CHANGE N/A 02/04/2013   Procedure: IMPLANTABLE CARDIOVERTER DEFIBRILLATOR GENERATOR CHANGE;  Surgeon: Marinus Maw, MD;  Location: [facility] CATH LAB;  Service: Cardiovascular;  Laterality: N/A;   LYMPH  NODE BIOPSY Right 10/04/2022   Procedure: EXCISIONAL OF RIGHT DEEP CERVICAL LYMPH NODE;  Surgeon: Laren Boom, DO;  Location: [facility] OR;  Service: ENT;  Laterality: Right;   SHOULDER ARTHROSCOPY Right 2015   TOTAL HIP ARTHROPLASTY Right 12/18/2017   Procedure: RIGHT TOTAL HIP ARTHROPLASTY;  Surgeon: Teryl Lucy, MD;  Location: [facility] OR;  Service: Orthopedics;  Laterality: Right;   TOTAL HIP ARTHROPLASTY Left 04/19/2021   Procedure: TOTAL HIP ARTHROPLASTY;  Surgeon: Teryl Lucy, MD;  Location: [facility]  ORS;  Service: Orthopedics;  Laterality: Left;   TOTAL KNEE ARTHROPLASTY Right 05/28/2018   Procedure: TOTAL KNEE ARTHROPLASTY;  Surgeon: Teryl Lucy, MD;  Location: [facility] ORS;  Service: Orthopedics;  Laterality: Right;  Adductor Block   TOTAL KNEE ARTHROPLASTY Left 08/12/2021   Procedure: TOTAL KNEE ARTHROPLASTY;  Surgeon: Teryl Lucy, MD;  Location: [facility];  Service: Orthopedics;  Laterality: Left;   TUBAL LIGATION Bilateral yrs ago   WISDOM TOOTH EXTRACTION        Allergies: Sumatriptan succinate and Amitriptyline  Medications: Prior to Admission medications   Medication Sig Start Date End Date Taking? Authorizing Provider  acetaminophen (TYLENOL) 650 MG CR tablet Take 1,300 mg by mouth 3 (three) times daily.    [provider]  acyclovir (ZOVIRAX) 400 MG tablet Take 1 tablet (400 mg total) by mouth 2 (two) times daily. 10/19/22   Johney Maine, MD  allopurinol (ZYLOPRIM) 300 MG tablet Take 0.5 tablets (150 mg total) by mouth daily. 10/19/22   Johney Maine, MD  apixaban (ELIQUIS) 5 MG TABS tablet TAKE 1 TABLET BY MOUTH TWICE A DAY 08/21/22   Marinus Maw, MD  b complex vitamins capsule Take 1 capsule by mouth daily.    [provider]  Calcium Carb-Cholecalciferol (CALCIUM 600 + D PO) Take 1 tablet by mouth daily.    [provider]  carvedilol (COREG) 12.5 MG tablet Take 12.5 mg by mouth 2 (two) times daily with a meal.     [provider]  Cholecalciferol (VITAMIN D3) 125 MCG (5000 UT) capsule Take 5,000 Units by mouth daily.    [provider]  cyanocobalamin (VITAMIN B12) 1000 MCG tablet Take 1,000 mcg by mouth daily.    [provider]  FIBER PO Take 1 capsule by mouth 2 (two) times daily.    [provider]  Fiber POWD Take 1 Scoop by mouth daily as needed (constipation).    [provider]  furosemide (LASIX) 40 MG tablet Take 40 mg by mouth daily as needed for edema.    [provider]  gabapentin (NEURONTIN) 300 MG capsule Take 300 mg by mouth 3 (three) times daily. 04/09/20   [provider]  lidocaine-prilocaine (EMLA) cream Apply to affected area once 10/19/22   Johney Maine, MD  loratadine (CLARITIN) 10 MG tablet Take 10 mg by mouth daily.    [provider]  Omega-3 Fatty Acids (FISH OIL) 1000 MG CAPS Take 1,000 mg by mouth daily.     [provider]  ondansetron (ZOFRAN) 8 MG tablet Take 1 tablet (8 mg total) by mouth every 8 (eight) hours as needed for nausea or vomiting. Start on day 3 after cyclophosphamide chemotherapy. 10/19/22   Johney Maine, MD  pantoprazole (PROTONIX) 40 MG tablet Take 1 tablet (40 mg total) by mouth 2 (two) times daily. 06/19/22 06/19/23  Kathi Der, MD  predniSONE (DELTASONE) 20 MG tablet Take 3 tablets (60 mg total) by mouth daily  with breakfast. Take for 5 days now x 1 course and then for 5 days after each cycle of chemotherapy 10/19/22   Johney Maine, MD  prochlorperazine (COMPAZINE) 10 MG tablet Take 1 tablet (10 mg total) by mouth every 6 (six) hours as needed for nausea or vomiting. 10/19/22   Johney Maine, MD  ramipril (ALTACE) 2.5 MG tablet Take 2.5 mg by mouth daily.      [provider]  simvastatin (ZOCOR) 40 MG tablet Take 40 mg by mouth at bedtime.      [provider]  traZODone (DESYREL) 50 MG tablet Take 50 mg by mouth at bedtime.     [provider]  venlafaxine (EFFEXOR) 75 MG tablet Take 75 mg by mouth daily. 03/06/21   [provider]  vitamin E 400 UNIT capsule Take 400 Units by mouth daily.    [provider]  zonisamide (ZONEGRAN) 50 MG capsule Take 50-100 mg by mouth See admin instructions. Take  by mouth in the morning and  at night. 09/13/17   [provider]     Vital Signs:There were no vitals filed for this visit.    Coder Status: FULL CODE   Physical Exam: awake/alert; chest- CTA bilat;  heart- RRR; left chest wall AICD; abd- soft, +BS,NT; no sig LE edema  Imaging: No results found.  Labs:  CBC: Recent Labs    06/19/22 1403 08/31/22 1104 09/28/22 1124 10/18/22 1209  WBC 4.5 4.0 3.8* 5.7  HGB 14.5 12.6 14.3 13.5  HCT 43.7 38.2 42.9 40.8  PLT 152 125* 105* 120*    COAGS: No results for input(s): "INR", "APTT" in the last 8760 hours.  BMP: Recent Labs    06/19/22 1403 08/31/22 1104 09/28/22 1124 10/18/22 1209  NA 142 141 139 139  K 3.8 3.9 4.0 4.1  CL 105 108 105 105  CO2 GLUCOSE 108* 114* 109* 74  BUN CALCIUM 9.4 8.6* 9.2 9.8  CREATININE 0.70 0.71 0.75 0.91  GFRNONAA >60 >60 >60 >60    LIVER FUNCTION TESTS: Recent Labs    06/19/22 1403 08/31/22 1104 10/18/22 1209  BILITOT 0.8 0.5 0.4  AST ALT ALKPHOS 73 72 75  PROT 7.6 6.4* 6.7  ALBUMIN 5.0 4.0 4.2    Assessment and Plan: 77 yo female with PMH of AICD, anemia, anxiety/depression, CAD with MI/stenting, CHF, DDD, HLD, skin cancer, LE DVT,  PE, HTN, ICM, OSA, PAF, vit D def who presents now with newly diagnosed diffuse large B cell lymphoma. She is scheduled today for port a cath placement to assist with treatment.Risks and benefits of image guided port-a-catheter placement was discussed with the patient including, but not limited to bleeding, infection, pneumothorax, or fibrin sheath development and need for additional procedures.  All of the patient's questions were answered, patient is agreeable to proceed. Consent signed and in chart.    Electronically Signed: D. Jeananne Rama, PA-C 10/27/2022, 9:38 AM   I spent a total of 25 minutes at the the patient's bedside AND on the patient's hospital floor or unit, greater than 50% of which was counseling/coordinating care for port a cath placement

## 2022-10-30 ENCOUNTER — Inpatient Hospital Stay: Payer: Medicare HMO

## 2022-10-30 ENCOUNTER — Encounter: Payer: Self-pay | Admitting: Hematology

## 2022-10-30 DIAGNOSIS — C8338 Diffuse large B-cell lymphoma, lymph nodes of multiple sites: Secondary | ICD-10-CM

## 2022-10-30 DIAGNOSIS — Z5112 Encounter for antineoplastic immunotherapy: Secondary | ICD-10-CM | POA: Diagnosis not present

## 2022-10-30 DIAGNOSIS — Z79899 Other long term (current) drug therapy: Secondary | ICD-10-CM | POA: Diagnosis not present

## 2022-10-30 DIAGNOSIS — Z5111 Encounter for antineoplastic chemotherapy: Secondary | ICD-10-CM | POA: Diagnosis not present

## 2022-10-30 DIAGNOSIS — Z5189 Encounter for other specified aftercare: Secondary | ICD-10-CM | POA: Diagnosis not present

## 2022-10-30 DIAGNOSIS — C8331 Diffuse large B-cell lymphoma, lymph nodes of head, face, and neck: Secondary | ICD-10-CM | POA: Diagnosis not present

## 2022-10-30 DIAGNOSIS — Z7189 Other specified counseling: Secondary | ICD-10-CM

## 2022-10-30 DIAGNOSIS — Z87891 Personal history of nicotine dependence: Secondary | ICD-10-CM | POA: Diagnosis not present

## 2022-10-30 LAB — CMP (CANCER CENTER ONLY)
ALT: 12 U/L (ref 0–44)
AST: 16 U/L (ref 15–41)
Albumin: 4 g/dL (ref 3.5–5.0)
Alkaline Phosphatase: 80 U/L (ref 38–126)
Anion gap: 7 (ref 5–15)
BUN: 11 mg/dL (ref 8–23)
CO2: 24 mmol/L (ref 22–32)
Calcium: 9.3 mg/dL (ref 8.9–10.3)
Chloride: 106 mmol/L (ref 98–111)
Creatinine: 0.69 mg/dL (ref 0.44–1.00)
GFR, Estimated: >60 mL/min (ref >60)
Glucose, Bld: 104 mg/dL — ABNORMAL HIGH (ref 70–99)
Potassium: 3.9 mmol/L (ref 3.5–5.1)
Sodium: 137 mmol/L (ref 135–145)
Total Bilirubin: 0.9 mg/dL (ref 0.3–1.2)
Total Protein: 6.8 g/dL (ref 6.5–8.1)

## 2022-10-30 LAB — CBC WITH DIFFERENTIAL (CANCER CENTER ONLY)
Abs Immature Granulocytes: 0.02 10*3/uL (ref 0.00–0.07)
Basophils Absolute: 0 10*3/uL (ref 0.0–0.1)
Basophils Relative: 0 %
Eosinophils Absolute: 0.1 10*3/uL (ref 0.0–0.5)
Eosinophils Relative: 2 %
HCT: 41.4 % (ref 36.0–46.0)
Hemoglobin: 13.6 g/dL (ref 12.0–15.0)
Immature Granulocytes: 0 %
Lymphocytes Relative: 9 %
Lymphs Abs: 0.5 10*3/uL — ABNORMAL LOW (ref 0.7–4.0)
MCH: 28.2 pg (ref 26.0–34.0)
MCHC: 32.9 g/dL (ref 30.0–36.0)
MCV: 85.9 fL (ref 80.0–100.0)
Monocytes Absolute: 0.6 10*3/uL (ref 0.1–1.0)
Monocytes Relative: 11 %
Neutro Abs: 4.4 10*3/uL (ref 1.7–7.7)
Neutrophils Relative %: 78 %
Platelet Count: 135 10*3/uL — ABNORMAL LOW (ref 150–400)
RBC: 4.82 MIL/uL (ref 3.87–5.11)
RDW: 16.6 % — ABNORMAL HIGH (ref 11.5–15.5)
WBC Count: 5.7 10*3/uL (ref 4.0–10.5)
nRBC: 0 % (ref 0.0–0.2)

## 2022-10-30 MED FILL — Dexamethasone Sodium Phosphate Inj 100 MG/10ML: INTRAMUSCULAR | Qty: 1 | Status: AC

## 2022-10-30 NOTE — Progress Notes (Signed)
Met w/ pt to introduce myself as her Dance movement psychotherapist and to discuss the [employer].  She would like to apply so she will bring her proof of income on 10/31/22.  If approved I will give her an expense sheet and my card for any questions or concerns she may have in the future.

## 2022-10-31 ENCOUNTER — Inpatient Hospital Stay (HOSPITAL_BASED_OUTPATIENT_CLINIC_OR_DEPARTMENT_OTHER): Payer: Medicare HMO | Admitting: Hematology

## 2022-10-31 ENCOUNTER — Inpatient Hospital Stay: Payer: Medicare HMO

## 2022-10-31 ENCOUNTER — Encounter: Payer: Self-pay | Admitting: Hematology

## 2022-10-31 VITALS — BP 112/58 | HR 76 | Temp 98.6°F | Resp 16 | Wt 159.5 lb

## 2022-10-31 DIAGNOSIS — C8338 Diffuse large B-cell lymphoma, lymph nodes of multiple sites: Secondary | ICD-10-CM | POA: Diagnosis not present

## 2022-10-31 DIAGNOSIS — Z79899 Other long term (current) drug therapy: Secondary | ICD-10-CM | POA: Diagnosis not present

## 2022-10-31 DIAGNOSIS — Z7189 Other specified counseling: Secondary | ICD-10-CM | POA: Diagnosis not present

## 2022-10-31 DIAGNOSIS — Z5111 Encounter for antineoplastic chemotherapy: Secondary | ICD-10-CM | POA: Diagnosis not present

## 2022-10-31 DIAGNOSIS — Z5112 Encounter for antineoplastic immunotherapy: Secondary | ICD-10-CM | POA: Diagnosis not present

## 2022-10-31 DIAGNOSIS — C8331 Diffuse large B-cell lymphoma, lymph nodes of head, face, and neck: Secondary | ICD-10-CM | POA: Diagnosis not present

## 2022-10-31 DIAGNOSIS — Z87891 Personal history of nicotine dependence: Secondary | ICD-10-CM | POA: Diagnosis not present

## 2022-10-31 DIAGNOSIS — Z5189 Encounter for other specified aftercare: Secondary | ICD-10-CM | POA: Diagnosis not present

## 2022-10-31 MED ORDER — SODIUM CHLORIDE 0.9 % IV SOLN: Freq: Once | INTRAVENOUS | Status: AC

## 2022-10-31 MED ORDER — PALONOSETRON HCL INJECTION 0.25 MG/5ML
0.2500 mg | Freq: Once | INTRAVENOUS | Status: AC
  Administered 2022-10-31: 0.25 mg via INTRAVENOUS
  Filled 2022-10-31: qty 5

## 2022-10-31 MED ORDER — FAMOTIDINE IN NACL 20-0.9 MG/50ML-% IV SOLN
20.0000 mg | Freq: Once | INTRAVENOUS | Status: AC
  Administered 2022-10-31: 20 mg via INTRAVENOUS
  Filled 2022-10-31: qty 50

## 2022-10-31 MED ORDER — VINCRISTINE SULFATE CHEMO INJECTION 1 MG/ML
1.0000 mg | Freq: Once | INTRAVENOUS | Status: AC
  Administered 2022-10-31: 1 mg via INTRAVENOUS
  Filled 2022-10-31: qty 1

## 2022-10-31 MED ORDER — SODIUM CHLORIDE 0.9 % IV SOLN
50.0000 mg/m2 | Freq: Once | INTRAVENOUS | Status: AC
  Administered 2022-10-31: 100 mg via INTRAVENOUS
  Filled 2022-10-31: qty 5

## 2022-10-31 MED ORDER — SODIUM CHLORIDE 0.9% FLUSH
10.0000 mL | INTRAVENOUS | Status: DC | PRN
  Administered 2022-10-31: 10 mL

## 2022-10-31 MED ORDER — HEPARIN SOD (PORK) LOCK FLUSH 100 UNIT/ML IV SOLN
500.0000 [IU] | Freq: Once | INTRAVENOUS | Status: AC | PRN
  Administered 2022-10-31: 500 [IU]

## 2022-10-31 MED ORDER — SODIUM CHLORIDE 0.9 % IV SOLN
500.0000 mg/m2 | Freq: Once | INTRAVENOUS | Status: AC
  Administered 2022-10-31: 1000 mg via INTRAVENOUS
  Filled 2022-10-31: qty 50

## 2022-10-31 MED ORDER — TRAMADOL HCL 50 MG PO TABS: 50.0000 mg | ORAL_TABLET | Freq: Two times a day (BID) | ORAL | 0 refills | Status: DC | PRN

## 2022-10-31 MED ORDER — ACETAMINOPHEN 325 MG PO TABS
650.0000 mg | ORAL_TABLET | Freq: Once | ORAL | Status: AC
  Administered 2022-10-31: 650 mg via ORAL
  Filled 2022-10-31: qty 2

## 2022-10-31 MED ORDER — DIPHENHYDRAMINE HCL 25 MG PO CAPS
50.0000 mg | ORAL_CAPSULE | Freq: Once | ORAL | Status: AC
  Administered 2022-10-31: 50 mg via ORAL
  Filled 2022-10-31: qty 2

## 2022-10-31 MED ORDER — SODIUM CHLORIDE 0.9 % IV SOLN
375.0000 mg/m2 | Freq: Once | INTRAVENOUS | Status: AC
  Administered 2022-10-31: 700 mg via INTRAVENOUS
  Filled 2022-10-31: qty 50

## 2022-10-31 MED ORDER — SODIUM CHLORIDE 0.9 % IV SOLN
10.0000 mg | Freq: Once | INTRAVENOUS | Status: AC
  Administered 2022-10-31: 10 mg via INTRAVENOUS
  Filled 2022-10-31: qty 10

## 2022-10-31 NOTE — Progress Notes (Signed)
HEMATOLOGY/ONCOLOGY CLINIC NOTE  Date of Service: 10/31/2022  Patient Care Team: Eunice Blase, PA-C as PCP - General (Internal Medicine) Marinus Maw, MD as PCP - Cardiology (Cardiology) Ebbie Ridge, MD (Family Medicine) Johney Maine, MD as Consulting Physician (Hematology)  CHIEF COMPLAINTS/PURPOSE OF CONSULTATION:  Management of newly diagnosed Diffuse large B cell lymphoma   HISTORY OF PRESENTING ILLNESS:   Rachel Bates is a wonderful 77 y.o. female who has been referred to [place] by Carylon Perches, DO for evaluation and management of non hodgkin's lymphoma. She has a hx of chronic systolic heart failure.  Nasolaryngoscopy revealed a mass lesion of the left oropharynx, emanating from the left base of tongue/glossotonsillar sulcus. Patient subsequently had a CT neck with contrast performed on 08/09/2022, which reported extensive bilateral adenopaty, with greatest lymph node conglomerate measuring 3.2 x 2.4 cm in the right level 2. Patient underwent core needle biopsy on 08/18/22 and endorsed persistent throat pain and odynophagia at the time. She is a former smoker and quit in the late 90s. SHe endorsed heavy tobacco use prior to quitting, with 1.5 pack/day for about 30 years.   Today, she is accompanied by her daughter. She complains of a sore throat beginning around Thanksgiving time. She was given antibiotics and prednisone which did not improve symptoms. She endorses swelling in her neck with associated soreness which came quickly past 2-3 weeks. She reports that her neck edema has recently grown on both the left and right sides. Her neck pain is in a specific area. She also complains of recent her neck pain if she turns her neck.  She did present Emergency EMT because something was stuck in her throat. Endoscopy revealed that it was food and it was pushed down. An ulcer was also found in her esophagus and some narrowing which will be stretched in three months.  She has not had any issues with swallowing food since then. She has not had a biopsy of issue on the back of her tongue. No other lumps/bumps, unexplained fevers, chills, night sweat, change in breathing, abdominal pain, or skin rashes. She reports discomfort with drinking tea which caused her to eat less as well.   She is allergic to Sumatriptan and another medication she cannot recall. She does not consume alcohol and has quit smoking in the 90s. 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 She believes the sore was caused by shoe discomfort.  She denies any facial puffiness, abdominal pain, recent change in bowel habits or urination. She reports that her defibrillator has never gone off.   She reports a ganglion cyst on her right knee, 2 knee replacements, and 2 hip replacements. She denies a Fhx of blood disorders. However, her sister has lung cancer and was a frequent smoker.  She is UTD with her vaccinations, incuding influenza, RSV, and COVID-19 booster. She has endorsed migraines since childhood. She lives on her own is able to complete daily activities independently. She will follow-up with Dr. Marene Lenz soon. She follows up with cardiologist regularly. Her a fib, pacemaker, and ICD device have been stable. She has not had a recent echo.   INTERVAL HISTORY:  Rachel Bates is a 77 y.o. female who presents today for follow up and management of diffuse large B-cell lymphoma.   She was last seen by me for follow up on 10/18/2022 and noted an increase in  right-sided neck swelling at that time with occasional throat pain and trouble swelling- she had relief of swelling with steroid dosepak.  Today, she states that she has had a mechanical trip and fall on her carpet since our last visit. She states that she did hit her head on her fiberglass closet door on her way down to the ground but denies any syncope.  Patient was not evaluated after her fall. She denies any headaches, back pain, neck pain, new weakness in her hands/legs, vision changes, dizziness, nausea/vomiting, photophobia, or new incontinence. She believes that she broke her fall by landing onto her left elbow. She was able to get back up on her own afterward.  She reports feeling as if she pulled a muscle in her right leg causing her to have difficulty walking since that time due to pain. Her pain radiates along her hamstrings into her gluteal region. Patient denies any groin pain or hip joint pain, and notes that her pain is most severe in her buttocks. She has been able to bear weight and walk with her cane. Her pain is moderately controlled with Tylenol. Her pain is most severe when she is standing.  She does have a h/o of bilateral THA and bilateral TKA.  In regards to her neck swelling, she states that she has had minimal improvement since starting Prednisone. She notes some chronic difficulty turning her neck at times which she attributes entirely due to the swelling. She denies any associated pain at this time.  She is tolerating the Prednisone well. She notes occasionally having difficulty falling asleep but this was an issue for her prior to steroid use.   MEDICAL HISTORY:  Past Medical History:  Diagnosis Date   AICD (automatic cardioverter/defibrillator) present dual   [employer] ---  original placedment 2007/  generator change 2014 by dr gregg taylor   Anemia    Anticoagulant long-term use    eliquis   Anxiety    Arthralgia of multiple joints    Arthritis pain    Benign hypertensive heart disease    CAD (coronary artery disease) primary cardiologist-- dr gregg taylor   MI and 2 stents 1995 in [place]   Cancer    CHF NYHA class II, chronic, systolic    followed by dr Sharlot Gowda taylor   Degenerative disc disease, lumbar    Depression    Dyslipidemia    History of basal cell carcinoma (BCC) excision    right ankle area  s/p  excision in office 06/ 2019  in office   History of DVT of lower extremity yrs ago before 2012   History of MI (myocardial infarction) 1995  in [place]   History of pulmonary embolus (PE) 2012   History of ventricular tachycardia    Hyperlipidemia    Hypersomnia    Hypertension    Ischemic cardiomyopathy    Migraines    Myocardial infarction    OA (osteoarthritis)    "all over"   Obstructive apnea    PAF (paroxysmal atrial fibrillation)    Primary localized osteoarthritis of right hip 12/18/2017   Primary localized osteoarthritis of right knee 05/28/2018   S/P coronary artery stent placement 1995   in [facility]   05-21-2018 per pt x2  stents in same coronary artery (unsure BM or DES)   Vitamin D deficiency disease     SURGICAL HISTORY: Past Surgical History:  Procedure Laterality Date   BIOPSY  06/19/2022   Procedure: BIOPSY;  Surgeon: Kathi Der, MD;  Location: [facility];  Service: Gastroenterology;;   CARDIAC DEFIBRILLATOR PLACEMENT  2007   CATARACT EXTRACTION, BILATERAL     COLONOSCOPY  04/14/2013   colonic polyp, status post polypectomy. Mild panocolonic diverticulosis. Small internal hemorrhoids   DILATION AND CURETTAGE OF UTERUS  yrs ago   DIRECT LARYNGOSCOPY Right 10/04/2022   Procedure: DIRECT LARYNGOSCOPY;  Surgeon: Cheron Schaumann A, DO;  Location: [facility];  Service: ENT;  Laterality: Right;   ESOPHAGOGASTRODUODENOSCOPY (EGD) WITH PROPOFOL N/A 06/19/2022   Procedure: ESOPHAGOGASTRODUODENOSCOPY (EGD) WITH PROPOFOL;  Surgeon: Kathi Der, MD;  Location: [facility];  Service: Gastroenterology;  Laterality: N/A;   IMPACTION REMOVAL  06/19/2022   Procedure: IMPACTION REMOVAL;  Surgeon: Kathi Der, MD;  Location: [facility];  Service: Gastroenterology;;   IMPLANTABLE CARDIOVERTER DEFIBRILLATOR GENERATOR CHANGE N/A 02/04/2013   Procedure: IMPLANTABLE CARDIOVERTER DEFIBRILLATOR GENERATOR CHANGE;  Surgeon: Marinus Maw, MD;  Location: [facility] CATH LAB;  Service:  Cardiovascular;  Laterality: N/A;   IR IMAGING GUIDED PORT INSERTION  10/27/2022   LYMPH NODE BIOPSY Right 10/04/2022   Procedure: EXCISIONAL OF RIGHT DEEP CERVICAL LYMPH NODE;  Surgeon: Laren Boom, DO;  Location: [facility];  Service: ENT;  Laterality: Right;   SHOULDER ARTHROSCOPY Right 2015   TOTAL HIP ARTHROPLASTY Right 12/18/2017   Procedure: RIGHT TOTAL HIP ARTHROPLASTY;  Surgeon: Teryl Lucy, MD;  Location: [facility];  Service: Orthopedics;  Laterality: Right;   TOTAL HIP ARTHROPLASTY Left 04/19/2021   Procedure: TOTAL HIP ARTHROPLASTY;  Surgeon: Teryl Lucy, MD;  Location: [facility];  Service: Orthopedics;  Laterality: Left;   TOTAL KNEE ARTHROPLASTY Right 05/28/2018   Procedure: TOTAL KNEE ARTHROPLASTY;  Surgeon: Teryl Lucy, MD;  Location: [facility];  Service: Orthopedics;  Laterality: Right;  Adductor Block   TOTAL KNEE ARTHROPLASTY Left 08/12/2021   Procedure: TOTAL KNEE ARTHROPLASTY;  Surgeon: Teryl Lucy, MD;  Location: [facility];  Service: Orthopedics;  Laterality: Left;   TUBAL LIGATION Bilateral yrs ago   WISDOM TOOTH EXTRACTION      SOCIAL HISTORY: Social History   Socioeconomic History   Marital status: Single    Spouse name: Not on file   Number of children: Not on file   Years of education: Not on file   Highest education level: Not on file  Occupational History   Not on file  Tobacco Use   Smoking status: Former    Years: 30    Types: Cigarettes    Quit date: 05/21/1996    Years since quitting: 26.4   Smokeless tobacco: Never  Vaping Use   Vaping Use: Never used  Substance and Sexual Activity   Alcohol use: No   Drug use: Never   Sexual activity: Not on file  Other Topics Concern   Not on file  Social History Narrative   Not on file   Social Determinants of Health   Financial Resource Strain: Low Risk  (05/28/2018)   Overall Financial Resource Strain (CARDIA)    Difficulty of Paying Living Expenses: Not hard at all  Food Insecurity: No Food  Insecurity (05/28/2018)   Hunger Vital Sign    Worried About Running Out of Food in the Last Year: Never true    Ran Out of Food in the Last Year: Never true  Transportation Needs: No Transportation Needs (05/28/2018)   PRAPARE - Administrator, Civil Service (Medical): No    Lack of Transportation (Non-Medical): No  Physical Activity: Not on file  Stress: Not on file  Social Connections: Not  on file  Intimate Partner Violence: Not on file    FAMILY HISTORY: Family History  Problem Relation Age of Onset   Hypertension Mother    Thyroid disease Mother    Alzheimer's disease Mother    Coronary artery disease Father    Pulmonary embolism Father    Congestive Heart Failure Maternal Grandmother    Hypertension Maternal Grandmother    Heart attack Maternal Grandfather    Other Maternal Grandfather        carotid disease   Dementia Paternal Grandmother    Other Paternal Grandfather 17       accident    ALLERGIES:  is allergic to sumatriptan succinate and amitriptyline.  MEDICATIONS:  Current Outpatient Medications  Medication Sig Dispense Refill   acetaminophen (TYLENOL) 650 MG CR tablet Take 1,300 mg by mouth 3 (three) times daily.     acyclovir (ZOVIRAX) 400 MG tablet Take 1 tablet (400 mg total) by mouth 2 (two) times daily. 30 tablet 5   allopurinol (ZYLOPRIM) 300 MG tablet Take 0.5 tablets (150 mg total) by mouth daily. 30 tablet 0   apixaban (ELIQUIS) 5 MG TABS tablet TAKE 1 TABLET BY MOUTH TWICE A DAY 60 tablet 5   b complex vitamins capsule Take 1 capsule by mouth daily.     Calcium Carb-Cholecalciferol (CALCIUM 600 + D PO) Take 1 tablet by mouth daily.     carvedilol (COREG) 12.5 MG tablet Take 12.5 mg by mouth 2 (two) times daily with a meal.      Cholecalciferol (VITAMIN D3) 125 MCG (5000 UT) capsule Take 5,000 Units by mouth daily.     cyanocobalamin (VITAMIN B12) 1000 MCG tablet Take 1,000 mcg by mouth daily.     FIBER PO Take 1 capsule by mouth 2 (two)  times daily.     Fiber POWD Take 1 Scoop by mouth daily as needed (constipation).     furosemide (LASIX) 40 MG tablet Take 40 mg by mouth daily as needed for edema.     gabapentin (NEURONTIN) 300 MG capsule Take 300 mg by mouth 3 (three) times daily.     lidocaine-prilocaine (EMLA) cream Apply to affected area once 30 g 3   loratadine (CLARITIN) 10 MG tablet Take 10 mg by mouth daily.     Multiple Vitamin (STRESS FORMULA) TABS Take 1 tablet by mouth daily.     Omega-3 Fatty Acids (FISH OIL) 1000 MG CAPS Take 1,000 mg by mouth daily.      ondansetron (ZOFRAN) 8 MG tablet Take 1 tablet (8 mg total) by mouth every 8 (eight) hours as needed for nausea or vomiting. Start on day 3 after cyclophosphamide chemotherapy. 30 tablet 1   pantoprazole (PROTONIX) 40 MG tablet Take 1 tablet (40 mg total) by mouth 2 (two) times daily. 180 tablet 3   predniSONE (DELTASONE) 20 MG tablet Take 3 tablets (60 mg total) by mouth daily with breakfast. Take for 5 days now x 1 course and then for 5 days after each cycle of chemotherapy 15 tablet 6   prochlorperazine (COMPAZINE) 10 MG tablet Take 1 tablet (10 mg total) by mouth every 6 (six) hours as needed for nausea or vomiting. 30 tablet 6   ramipril (ALTACE) 2.5 MG tablet Take 2.5 mg by mouth daily.       simvastatin (ZOCOR) 40 MG tablet Take 40 mg by mouth at bedtime.       traZODone (DESYREL) 50 MG tablet Take 50 mg by mouth at bedtime.  venlafaxine (EFFEXOR) 75 MG tablet Take 75 mg by mouth daily.     vitamin E 400 UNIT capsule Take 400 Units by mouth daily.     zonisamide (ZONEGRAN) 50 MG capsule Take 50-100 mg by mouth See admin instructions. Take 50mg  by mouth in the morning and 100mg  at night.     No current facility-administered medications for this visit.   Facility-Administered Medications Ordered in Other Visits  Medication Dose Route Frequency Provider Last Rate Last Admin   cyclophosphamide (CYTOXAN) 1,000 mg in sodium chloride 0.9 % 250 mL chemo  infusion  500 mg/m2 (Treatment Plan Recorded) Intravenous Once Johney Maine, MD       etoposide (VEPESID) 100 mg in sodium chloride 0.9 % 500 mL chemo infusion  50 mg/m2 (Treatment Plan Recorded) Intravenous Once Johney Maine, MD       heparin lock flush 100 unit/mL  500 Units Intracatheter Once PRN Johney Maine, MD       riTUXimab-pvvr (RUXIENCE) 700 mg in sodium chloride 0.9 % 250 mL (2.1875 mg/mL) infusion  375 mg/m2 (Treatment Plan Recorded) Intravenous Once Johney Maine, MD       sodium chloride flush (NS) 0.9 % injection 10 mL  10 mL Intracatheter PRN Johney Maine, MD       vinCRIStine (ONCOVIN) 1 mg in sodium chloride 0.9 % 50 mL chemo infusion  1 mg Intravenous Once Johney Maine, MD        REVIEW OF SYSTEMS:   10 Point review of Systems was done is negative except as noted above.  PHYSICAL EXAMINATION: ECOG PERFORMANCE STATUS: 1 - Symptomatic but completely ambulatory  VSS  GENERAL:alert, in no acute distress and comfortable SKIN: no acute rashes, no significant lesions EYES: conjunctiva are pink and non-injected, sclera anicteric OROPHARYNX: MMM, no exudates, no oropharyngeal erythema or ulceration NECK: supple, no JVD LYMPH:  b/l cervical lymphadenopathy- biopsy incision well-healed  LUNGS: clear to auscultation b/l with normal respiratory effort HEART: regular rate & rhythm ABDOMEN:  normoactive bowel sounds , non tender, not distended. Extremity: no pedal edema PSYCH: alert & oriented x 3 with fluent speech NEURO: no focal motor/sensory deficits  LABORATORY DATA:  I have reviewed the data as listed    Latest Ref Rng & Units 10/30/2022    9:41 AM 10/18/2022   12:09 PM 09/28/2022   11:24 AM  CBC  WBC 4.0 - 10.5 K/uL 5.7  5.7  3.8   Hemoglobin 12.0 - 15.0 g/dL 09.8  11.9  14.7   Hematocrit 36.0 - 46.0 % 41.4  40.8  42.9   Platelets 150 - 400 K/uL 135  120  105       Latest Ref Rng & Units 10/30/2022    9:41 AM 10/18/2022    12:09 PM 09/28/2022   11:24 AM  CMP  Glucose 70 - 99 mg/dL 829  74  562   BUN 8 - 23 mg/dL 11  12  11    Creatinine 0.44 - 1.00 mg/dL 1.30  8.65  7.84   Sodium 135 - 145 mmol/L 137  139  139   Potassium 3.5 - 5.1 mmol/L 3.9  4.1  4.0   Chloride 98 - 111 mmol/L 106  105  105   CO2 22 - 32 mmol/L 24  29  25    Calcium 8.9 - 10.3 mg/dL 9.3  9.8  9.2   Total Protein 6.5 - 8.1 g/dL 6.8  6.7    Total Bilirubin 0.3 -  1.2 mg/dL 0.9  0.4    Alkaline Phos 38 - 126 U/L 80  75    AST 15 - 41 U/L 16  21    ALT 0 - 44 U/L 12  16     Lab Results  Component Value Date   LDH 168 10/18/2022   Molecular Pathology 10/21/2022:    Surgical Pathology 10/04/22: A. LYMPH NODE, RIGHT LEVEL 2 DEEP CERVICAL, EXCISION:  -Diffuse large B-cell lymphoma  -See comment COMMENT: The sections show diffuse effacement of the lymph nodal architecture primarily by a population of large lymphoid cells characterized by vesicular chromatin and small nucleoli associated with apoptosis and brisk mitosis.  In some areas, the large atypical lymphoid proliferation extends into the perinodal adipose tissue.  In this background, there are scattered variably sized and somewhat disrupted aggregates of primarily small lymphoid cells characterized by high nuclear cytoplasmic ratio, angulated nuclear contours and small to inconspicuous nucleoli. Flow cytometric analysis was performed [facility] [id number]) and shows a monoclonal, lambda restricted B-cell population expressing CD10.  In addition, immunohistochemical stains for CD3, CD5, CD10, CD20, PAX5, BCL6, Bcl-2, Ki-67, CD30, CD138, CD21, EBV in addition to in situ hybridization for kappa and lambda were performed with appropriate controls.  The large lymphoid cells are positive for CD20, PAX5, CD10, BCL6, Bcl-2, and partially for cytoplasmic lambda.  No significant staining is seen with EBV, CD30, CD138 or cytoplasmic kappa.  Ki67 shows variably increased expression (more than 50%  in some areas). CD21 highlights scattered somewhat disrupted follicular dendritic networks. The lymphoid aggregates of primarily small lymphoid cells previously described show positivity for B-cell markers CD20 and PAX5 in addition to CD10 and Bcl-2.  There is an admixed variable T-cell component in the background as seen with CD3 and CD5 and there is no apparent co-expression of CD5 in B-cell areas.  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   RADIOGRAPHIC STUDIES: I have personally reviewed the radiological images as listed and agreed with the findings in the report. IR IMAGING GUIDED PORT INSERTION  Result Date: 10/27/2022 INDICATION: Lymphoma. In need of durable intravenous access for chemotherapy administration. EXAM: IMPLANTED PORT A CATH PLACEMENT WITH ULTRASOUND AND FLUOROSCOPIC GUIDANCE COMPARISON:  PET-CT-09/04/2022 MEDICATIONS: None ANESTHESIA/SEDATION: Moderate (conscious) sedation was employed during this procedure as administered by the Interventional Radiology RN. A total of Versed 2 mg and Fentanyl 100 mcg was administered intravenously. Moderate Sedation Time: 24 minutes. The patient's level of consciousness and vital signs were monitored continuously by radiology nursing throughout the procedure under my direct supervision. CONTRAST:  None FLUOROSCOPY TIME:  54 seconds (2 mGy) COMPLICATIONS: None immediate. PROCEDURE: The procedure, risks, benefits, and alternatives were explained to the patient. Questions regarding the procedure were encouraged and answered. The patient understands and consents to the procedure. Sonographic evaluation of the neck demonstrates bulky malignant adenopathy bilaterally. Given the presence of the left anterior chest wall AICD/pacemaker of the, the decision was made to proceed with right internal jugular approach port  a catheter placement. As such, the right neck and chest were prepped with chlorhexidine in a sterile fashion, and a sterile drape was applied covering the operative field. Maximum barrier sterile technique with sterile gowns and gloves were used for the procedure. A timeout was performed prior to the initiation of the procedure. Local anesthesia was provided with 1% lidocaine with epinephrine. After creating a small venotomy incision, a  micropuncture kit was utilized to access the internal jugular vein. Real-time ultrasound guidance was utilized for vascular access including the acquisition of a permanent ultrasound image documenting patency of the accessed vessel. The microwire was utilized to measure appropriate catheter length. A subcutaneous port pocket was then created along the upper chest wall utilizing a combination of sharp and blunt dissection. The pocket was irrigated with sterile saline. A single lumen Slim sized power injectable port was chosen for placement. The 8 Fr catheter was tunneled from the port pocket site to the venotomy incision. The port was placed in the pocket. The external catheter was trimmed to appropriate length. At the venotomy, an 8 Fr peel-away sheath was placed over a guidewire under fluoroscopic guidance. The catheter was then placed through the sheath and the sheath was removed. Final catheter positioning was confirmed and documented with a fluoroscopic spot radiograph. The port was accessed with a Huber needle, aspirated and flushed with heparinized saline. The venotomy site was closed with an interrupted 4-0 Vicryl suture. The port pocket incision was closed with interrupted 2-0 Vicryl suture. Dermabond and Steri-strips were applied to both incisions. Dressings were applied. The patient tolerated the procedure well without immediate post procedural complication. FINDINGS: Sonographic evaluation demonstrates bulky bilateral malignant appearing cervical lymphadenopathy, findings  compatible with recent PET-CT. After catheter placement, the tip lies within the superior cavoatrial junction. The catheter aspirates and flushes normally and is ready for immediate use. IMPRESSION: Successful placement of a right internal jugular approach power injectable Port-A-Cath. The catheter is ready for immediate use. Electronically Signed   By: Simonne Come M.D.   On: 10/27/2022 17:35   CUP PACEART REMOTE DEVICE CHECK  Result Date: 10/04/2022 Scheduled non-billable remote reviewed. Normal device function.  Battery estimated 63mo Next remote 4/29 [place], CV[facility]   NM PET 09/04/2022: IMPRESSION: 1. There is a large tracer avid mass centered within the left base of tongue and left lingual region which extends into the hypopharyngeal region ventral to the epiglottis. Imaging findings are compatible with a primary head and neck malignancy. 2. A smaller focus of increased uptake localizes to the right pharyngeal tonsil region. Indeterminate. 3. Bilateral tracer avid cervical and supraclavicular lymph nodes compatible with nodal metastasis. 4. There is a 7 mm tracer avid nodule within the anteromedial left upper lobe which is suspicious for pulmonary metastasis. 5. There is increased uptake identified along the long axis of the posterior right seventh rib. No corresponding lytic or sclerotic changes identified on the CT images. Cannot exclude bone metastases. 6. Multiple prominent retroperitoneal and mesenteric lymph nodes are identified which exhibit mild tracer uptake. This is a nonspecific finding and may reflect reactive adenopathy. Metastatic adenopathy cannot be excluded. Attention on future surveillance imaging is advised. 7.  Aortic Atherosclerosis (ICD10-I70.0).   ASSESSMENT & PLAN:   77 y.o.  female with:   1. Diffuse large B-cell lymphoma -at least stage IIIA per PET CT scan -Presented as right sided sore throat with oropharyngeal mass noted on nasolaryngoscopy and bulky cervical  adenopathy bilaterally - Biopsy 10/04/22 confirmed diffuse large B-cell lymphoma. 2. 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 -Molecular pathology from 10/21/22 shows no evidence of double or triple-hit lymphoma. I discussed these results with the patient and her family today. -Reviewed labs with pt in detail, CBC unremarkable, platelets improved to 135K. CMP WNL. -She denies  any issues with her port placement. -Will start cycle 1 day 1 of R-CEOP today--without doxorubicin given her  CHF history, substituting Etoposide instead. -Will get an echocardiogram as it has been many years since her last echo- scheduled for 11/08/2022. -Continue Prednisone regimen with treatment. 60mg  daily x 5 days. She is tolerating this well overall. -In regards to her RLE pain s/p mechanical fall: She has been limiting her movement due to worsened pain with standing/walking. I explained that her symptoms are most consistent with a muscle strain at this time and should continue to improve with time and movement. -Continue scheduled Tylenol and will prescribe prn tramadol -She declined offer for imaging studies at this time. -I advised her to follow up with her orthopedist within the next week or so. She may undergo PT dependent on her orthopedist's recommendations.   FOLLOW UP: [place]/labs and MD visit for toxicity check on 11/15/2022 C2 per integrated scheduling   The total time spent in the appointment was 32 minutes* .  All of the patient's questions were answered with apparent satisfaction. The patient knows to call the clinic with any problems, questions or concerns.  Wyvonnia Lora MD MS AAHIVMS [facility] Hematology/Oncology Physician [facility]  *Total Encounter Time as defined by the Centers for Medicare and Medicaid  Services includes, in addition to the face-to-face time of a patient visit (documented in the note above) non-face-to-face time: obtaining and reviewing outside history, ordering and reviewing medications, tests or procedures, care coordination (communications with other health care professionals or caregivers) and documentation in the medical record.  I,Alexis Herring,acting as a Neurosurgeon for Wyvonnia Lora, MD.,have documented all relevant documentation on the behalf of Wyvonnia Lora, MD,as directed by  Wyvonnia Lora, MD while in the presence of Wyvonnia Lora, MD.  .I have reviewed the above documentation for accuracy and completeness, and I agree with the above. Johney Maine MD

## 2022-10-31 NOTE — Progress Notes (Signed)
Pt is approved for the $1000 [employer] grant.  

## 2022-10-31 NOTE — Patient Instructions (Signed)
[facility]  Discharge Instructions: Thank you for choosing [facility] to provide your oncology and hematology care.   If you have a lab appointment with the Cancer Center, please go directly to the Cancer Center and check in at the registration area.   Wear comfortable clothing and clothing appropriate for easy access to any Portacath or PICC line.   We strive to give you quality time with your provider. You may need to reschedule your appointment if you arrive late (15 or more minutes).  Arriving late affects you and other patients whose appointments are after yours.  Also, if you miss three or more appointments without notifying the office, you may be dismissed from the clinic at the provider's discretion.      For prescription refill requests, have your pharmacy contact our office and allow 72 hours for refills to be completed.    Today you received the following chemotherapy and/or immunotherapy agents: Vincristine/Cytoxan/Etoposide/Ruxience     To help prevent nausea and vomiting after your treatment, we encourage you to take your nausea medication as directed.  BELOW ARE SYMPTOMS THAT SHOULD BE REPORTED IMMEDIATELY: *FEVER GREATER THAN 100.4 F (38 C) OR HIGHER *CHILLS OR SWEATING *NAUSEA AND VOMITING THAT IS NOT CONTROLLED WITH YOUR NAUSEA MEDICATION *UNUSUAL SHORTNESS OF BREATH *UNUSUAL BRUISING OR BLEEDING *URINARY PROBLEMS (pain or burning when urinating, or frequent urination) *BOWEL PROBLEMS (unusual diarrhea, constipation, pain near the anus) TENDERNESS IN MOUTH AND THROAT WITH OR WITHOUT PRESENCE OF ULCERS (sore throat, sores in mouth, or a toothache) UNUSUAL RASH, SWELLING OR PAIN  UNUSUAL VAGINAL DISCHARGE OR ITCHING   Items with * indicate a potential emergency and should be followed up as soon as possible or go to the Emergency Department if any problems should occur.  Please show the CHEMOTHERAPY ALERT CARD or  IMMUNOTHERAPY ALERT CARD at check-in to the Emergency Department and triage nurse.  Should you have questions after your visit or need to cancel or reschedule your appointment, please contact [place] [facility]  Dept: [phone]  and follow the prompts.  Office hours are 8:00 a.m. to 4:30 p.m. Monday - Friday. Please note that voicemails left after 4:00 p.m. may not be returned until the following business day.  We are closed weekends and major holidays. You have access to a nurse at all times for urgent questions. Please call the main number to the clinic Dept: [phone] and follow the prompts.   For any non-urgent questions, you may also contact your provider using MyChart. We now offer e-Visits for anyone 39 and older to request care online for non-urgent symptoms. For details visit [email or web address].   Also download the MyChart app! Go to the app store, search "MyChart", open the app, select , and log in with your MyChart username and password.

## 2022-11-01 ENCOUNTER — Inpatient Hospital Stay: Payer: Medicare HMO

## 2022-11-01 ENCOUNTER — Telehealth: Payer: Self-pay | Admitting: Hematology

## 2022-11-01 VITALS — BP 132/59 | HR 82 | Temp 97.7°F | Resp 16

## 2022-11-01 DIAGNOSIS — Z5111 Encounter for antineoplastic chemotherapy: Secondary | ICD-10-CM | POA: Diagnosis not present

## 2022-11-01 DIAGNOSIS — Z87891 Personal history of nicotine dependence: Secondary | ICD-10-CM | POA: Diagnosis not present

## 2022-11-01 DIAGNOSIS — C8338 Diffuse large B-cell lymphoma, lymph nodes of multiple sites: Secondary | ICD-10-CM

## 2022-11-01 DIAGNOSIS — Z7189 Other specified counseling: Secondary | ICD-10-CM

## 2022-11-01 DIAGNOSIS — Z79899 Other long term (current) drug therapy: Secondary | ICD-10-CM | POA: Diagnosis not present

## 2022-11-01 DIAGNOSIS — Z5112 Encounter for antineoplastic immunotherapy: Secondary | ICD-10-CM | POA: Diagnosis not present

## 2022-11-01 DIAGNOSIS — C8331 Diffuse large B-cell lymphoma, lymph nodes of head, face, and neck: Secondary | ICD-10-CM | POA: Diagnosis not present

## 2022-11-01 DIAGNOSIS — M533 Sacrococcygeal disorders, not elsewhere classified: Secondary | ICD-10-CM | POA: Diagnosis not present

## 2022-11-01 DIAGNOSIS — Z5189 Encounter for other specified aftercare: Secondary | ICD-10-CM | POA: Diagnosis not present

## 2022-11-01 MED ORDER — SODIUM CHLORIDE 0.9 % IV SOLN: Freq: Once | INTRAVENOUS | Status: AC

## 2022-11-01 MED ORDER — PROCHLORPERAZINE MALEATE 10 MG PO TABS
10.0000 mg | ORAL_TABLET | Freq: Once | ORAL | Status: AC
  Administered 2022-11-01: 10 mg via ORAL
  Filled 2022-11-01: qty 1

## 2022-11-01 MED ORDER — SODIUM CHLORIDE 0.9 % IV SOLN
50.0000 mg/m2 | Freq: Once | INTRAVENOUS | Status: AC
  Administered 2022-11-01: 100 mg via INTRAVENOUS
  Filled 2022-11-01: qty 5

## 2022-11-01 MED ORDER — HEPARIN SOD (PORK) LOCK FLUSH 100 UNIT/ML IV SOLN
500.0000 [IU] | Freq: Once | INTRAVENOUS | Status: AC | PRN
  Administered 2022-11-01: 500 [IU]

## 2022-11-01 MED ORDER — SODIUM CHLORIDE 0.9% FLUSH
10.0000 mL | INTRAVENOUS | Status: DC | PRN
  Administered 2022-11-01: 10 mL

## 2022-11-01 NOTE — Patient Instructions (Signed)
[facility]  Discharge Instructions: Thank you for choosing [facility] to provide your oncology and hematology care.   If you have a lab appointment with the Cancer Center, please go directly to the Cancer Center and check in at the registration area.   Wear comfortable clothing and clothing appropriate for easy access to any Portacath or PICC line.   We strive to give you quality time with your provider. You may need to reschedule your appointment if you arrive late (15 or more minutes).  Arriving late affects you and other patients whose appointments are after yours.  Also, if you miss three or more appointments without notifying the office, you may be dismissed from the clinic at the provider's discretion.      For prescription refill requests, have your pharmacy contact our office and allow 72 hours for refills to be completed.    Today you received the following chemotherapy and/or immunotherapy agents; Etoposide      To help prevent nausea and vomiting after your treatment, we encourage you to take your nausea medication as directed.  BELOW ARE SYMPTOMS THAT SHOULD BE REPORTED IMMEDIATELY: *FEVER GREATER THAN 100.4 F (38 C) OR HIGHER *CHILLS OR SWEATING *NAUSEA AND VOMITING THAT IS NOT CONTROLLED WITH YOUR NAUSEA MEDICATION *UNUSUAL SHORTNESS OF BREATH *UNUSUAL BRUISING OR BLEEDING *URINARY PROBLEMS (pain or burning when urinating, or frequent urination) *BOWEL PROBLEMS (unusual diarrhea, constipation, pain near the anus) TENDERNESS IN MOUTH AND THROAT WITH OR WITHOUT PRESENCE OF ULCERS (sore throat, sores in mouth, or a toothache) UNUSUAL RASH, SWELLING OR PAIN  UNUSUAL VAGINAL DISCHARGE OR ITCHING   Items with * indicate a potential emergency and should be followed up as soon as possible or go to the Emergency Department if any problems should occur.  Please show the CHEMOTHERAPY ALERT CARD or IMMUNOTHERAPY ALERT CARD at  check-in to the Emergency Department and triage nurse.  Should you have questions after your visit or need to cancel or reschedule your appointment, please contact [facility]  Dept: [phone]  and follow the prompts.  Office hours are 8:00 a.m. to 4:30 p.m. Monday - Friday. Please note that voicemails left after 4:00 p.m. may not be returned until the following business day.  We are closed weekends and major holidays. You have access to a nurse at all times for urgent questions. Please call the main number to the clinic Dept: [phone] and follow the prompts.   For any non-urgent questions, you may also contact your provider using MyChart. We now offer e-Visits for anyone 18 and older to request care online for non-urgent symptoms. For details visit [email or web address].   Also download the MyChart app! Go to the app store, search "MyChart", open the app, select [facility], and log in with your MyChart username and password.   

## 2022-11-02 ENCOUNTER — Inpatient Hospital Stay: Payer: Medicare HMO

## 2022-11-02 ENCOUNTER — Other Ambulatory Visit: Payer: Self-pay

## 2022-11-02 VITALS — BP 116/59 | HR 68 | Temp 97.8°F | Resp 18

## 2022-11-02 DIAGNOSIS — Z5112 Encounter for antineoplastic immunotherapy: Secondary | ICD-10-CM | POA: Diagnosis not present

## 2022-11-02 DIAGNOSIS — C8338 Diffuse large B-cell lymphoma, lymph nodes of multiple sites: Secondary | ICD-10-CM

## 2022-11-02 DIAGNOSIS — C8331 Diffuse large B-cell lymphoma, lymph nodes of head, face, and neck: Secondary | ICD-10-CM | POA: Diagnosis not present

## 2022-11-02 DIAGNOSIS — Z5189 Encounter for other specified aftercare: Secondary | ICD-10-CM | POA: Diagnosis not present

## 2022-11-02 DIAGNOSIS — Z79899 Other long term (current) drug therapy: Secondary | ICD-10-CM | POA: Diagnosis not present

## 2022-11-02 DIAGNOSIS — Z5111 Encounter for antineoplastic chemotherapy: Secondary | ICD-10-CM | POA: Diagnosis not present

## 2022-11-02 DIAGNOSIS — Z87891 Personal history of nicotine dependence: Secondary | ICD-10-CM | POA: Diagnosis not present

## 2022-11-02 DIAGNOSIS — Z7189 Other specified counseling: Secondary | ICD-10-CM

## 2022-11-02 MED ORDER — SODIUM CHLORIDE 0.9% FLUSH
10.0000 mL | INTRAVENOUS | Status: DC | PRN
  Administered 2022-11-02: 10 mL

## 2022-11-02 MED ORDER — HEPARIN SOD (PORK) LOCK FLUSH 100 UNIT/ML IV SOLN
500.0000 [IU] | Freq: Once | INTRAVENOUS | Status: AC | PRN
  Administered 2022-11-02: 500 [IU]

## 2022-11-02 MED ORDER — PROCHLORPERAZINE MALEATE 10 MG PO TABS
10.0000 mg | ORAL_TABLET | Freq: Once | ORAL | Status: AC
  Administered 2022-11-02: 10 mg via ORAL
  Filled 2022-11-02: qty 1

## 2022-11-02 MED ORDER — SODIUM CHLORIDE 0.9 % IV SOLN: Freq: Once | INTRAVENOUS | Status: AC

## 2022-11-02 MED ORDER — SODIUM CHLORIDE 0.9 % IV SOLN
50.0000 mg/m2 | Freq: Once | INTRAVENOUS | Status: AC
  Administered 2022-11-02: 100 mg via INTRAVENOUS
  Filled 2022-11-02: qty 5

## 2022-11-02 NOTE — Patient Instructions (Signed)
[facility]  Discharge Instructions: Thank you for choosing [facility] to provide your oncology and hematology care.   If you have a lab appointment with the Cancer Center, please go directly to the Cancer Center and check in at the registration area.   Wear comfortable clothing and clothing appropriate for easy access to any Portacath or PICC line.   We strive to give you quality time with your provider. You may need to reschedule your appointment if you arrive late (15 or more minutes).  Arriving late affects you and other patients whose appointments are after yours.  Also, if you miss three or more appointments without notifying the office, you may be dismissed from the clinic at the provider's discretion.      For prescription refill requests, have your pharmacy contact our office and allow 72 hours for refills to be completed.    Today you received the following chemotherapy and/or immunotherapy agents; Etoposide      To help prevent nausea and vomiting after your treatment, we encourage you to take your nausea medication as directed.  BELOW ARE SYMPTOMS THAT SHOULD BE REPORTED IMMEDIATELY: *FEVER GREATER THAN 100.4 F (38 C) OR HIGHER *CHILLS OR SWEATING *NAUSEA AND VOMITING THAT IS NOT CONTROLLED WITH YOUR NAUSEA MEDICATION *UNUSUAL SHORTNESS OF BREATH *UNUSUAL BRUISING OR BLEEDING *URINARY PROBLEMS (pain or burning when urinating, or frequent urination) *BOWEL PROBLEMS (unusual diarrhea, constipation, pain near the anus) TENDERNESS IN MOUTH AND THROAT WITH OR WITHOUT PRESENCE OF ULCERS (sore throat, sores in mouth, or a toothache) UNUSUAL RASH, SWELLING OR PAIN  UNUSUAL VAGINAL DISCHARGE OR ITCHING   Items with * indicate a potential emergency and should be followed up as soon as possible or go to the Emergency Department if any problems should occur.  Please show the CHEMOTHERAPY ALERT CARD or IMMUNOTHERAPY ALERT CARD at  check-in to the Emergency Department and triage nurse.  Should you have questions after your visit or need to cancel or reschedule your appointment, please contact [facility]  Dept: [phone]  and follow the prompts.  Office hours are 8:00 a.m. to 4:30 p.m. Monday - Friday. Please note that voicemails left after 4:00 p.m. may not be returned until the following business day.  We are closed weekends and major holidays. You have access to a nurse at all times for urgent questions. Please call the main number to the clinic Dept: [phone] and follow the prompts.   For any non-urgent questions, you may also contact your provider using MyChart. We now offer e-Visits for anyone 18 and older to request care online for non-urgent symptoms. For details visit [email or web address].   Also download the MyChart app! Go to the app store, search "MyChart", open the app, select [place], and log in with your MyChart username and password.   

## 2022-11-04 ENCOUNTER — Inpatient Hospital Stay: Payer: Medicare HMO

## 2022-11-04 VITALS — BP 95/48 | HR 79 | Temp 97.3°F | Resp 18

## 2022-11-04 DIAGNOSIS — C8338 Diffuse large B-cell lymphoma, lymph nodes of multiple sites: Secondary | ICD-10-CM

## 2022-11-04 DIAGNOSIS — Z5112 Encounter for antineoplastic immunotherapy: Secondary | ICD-10-CM | POA: Diagnosis not present

## 2022-11-04 DIAGNOSIS — Z79899 Other long term (current) drug therapy: Secondary | ICD-10-CM | POA: Diagnosis not present

## 2022-11-04 DIAGNOSIS — C8331 Diffuse large B-cell lymphoma, lymph nodes of head, face, and neck: Secondary | ICD-10-CM | POA: Diagnosis not present

## 2022-11-04 DIAGNOSIS — Z87891 Personal history of nicotine dependence: Secondary | ICD-10-CM | POA: Diagnosis not present

## 2022-11-04 DIAGNOSIS — Z5189 Encounter for other specified aftercare: Secondary | ICD-10-CM | POA: Diagnosis not present

## 2022-11-04 DIAGNOSIS — Z7189 Other specified counseling: Secondary | ICD-10-CM

## 2022-11-04 DIAGNOSIS — Z5111 Encounter for antineoplastic chemotherapy: Secondary | ICD-10-CM | POA: Diagnosis not present

## 2022-11-04 MED ORDER — PEGFILGRASTIM-CBQV 6 MG/0.6ML ~~LOC~~ SOSY
6.0000 mg | PREFILLED_SYRINGE | Freq: Once | SUBCUTANEOUS | Status: AC
  Administered 2022-11-04: 6 mg via SUBCUTANEOUS

## 2022-11-04 NOTE — Patient Instructions (Signed)

## 2022-11-06 ENCOUNTER — Ambulatory Visit (INDEPENDENT_AMBULATORY_CARE_PROVIDER_SITE_OTHER): Payer: Medicare HMO

## 2022-11-06 ENCOUNTER — Encounter: Payer: Self-pay | Admitting: Hematology

## 2022-11-06 DIAGNOSIS — I4729 Other ventricular tachycardia: Secondary | ICD-10-CM

## 2022-11-06 NOTE — Telephone Encounter (Signed)
Called pt to see how she has done with  treatment.  She reports doing well & denies any concerns.  She knows how to reach [place] if needed & knows her appts.

## 2022-11-06 NOTE — Telephone Encounter (Signed)
-----   Message from Marylouise Stacks, RN sent at 10/31/2022  3:25 PM EDT ----- Regarding: Dr. Candise Che 1st time f/u Vincristine/Cytoxan/Etoposide/Rituxan Dr. Candise Che 1st time oncovin/cytoxan/etoposide/rituxan. Pt tolerated tx well without incident. Pt call back due.

## 2022-11-07 LAB — CUP PACEART REMOTE DEVICE CHECK
Battery Remaining Longevity: 4 mo
Battery Voltage: 2.82 V
Brady Statistic AP VP Percent: 0.01 %
Brady Statistic AP VS Percent: 11.06 %
Brady Statistic AS VP Percent: 0.03 %
Brady Statistic AS VS Percent: 88.9 %
Brady Statistic RA Percent Paced: 11.03 %
Brady Statistic RV Percent Paced: 0.04 %
Date Time Interrogation Session: 20240430083002
HighPow Impedance: 50 Ohm
HighPow Impedance: 64 Ohm
Implantable Lead Connection Status: 753985
Implantable Lead Connection Status: 753985
Implantable Lead Implant Date: 20071003
Implantable Lead Implant Date: 20071019
Implantable Lead Location: 753859
Implantable Lead Location: 753860
Implantable Lead Model: 5076
Implantable Lead Model: 6947
Implantable Pulse Generator Implant Date: 20140729
Lead Channel Impedance Value: 285 Ohm
Lead Channel Impedance Value: 399 Ohm
Lead Channel Impedance Value: 456 Ohm
Lead Channel Pacing Threshold Amplitude: 0.5 V
Lead Channel Pacing Threshold Amplitude: 1 V
Lead Channel Pacing Threshold Pulse Width: 0.4 ms
Lead Channel Pacing Threshold Pulse Width: 0.4 ms
Lead Channel Sensing Intrinsic Amplitude: 3.5 mV
Lead Channel Sensing Intrinsic Amplitude: 3.5 mV
Lead Channel Sensing Intrinsic Amplitude: 7.25 mV
Lead Channel Sensing Intrinsic Amplitude: 7.25 mV
Lead Channel Setting Pacing Amplitude: 2 V
Lead Channel Setting Pacing Amplitude: 2.5 V
Lead Channel Setting Pacing Pulse Width: 0.4 ms
Lead Channel Setting Sensing Sensitivity: 0.3 mV
Zone Setting Status: 755011

## 2022-11-08 ENCOUNTER — Ambulatory Visit (HOSPITAL_COMMUNITY)
Admission: RE | Admit: 2022-11-08 | Discharge: 2022-11-08 | Disposition: A | Discharge status: Home / Self Care | Payer: Medicare HMO | Source: Ambulatory Visit | Attending: Hematology | Admitting: Hematology

## 2022-11-08 DIAGNOSIS — C8338 Diffuse large B-cell lymphoma, lymph nodes of multiple sites: Secondary | ICD-10-CM | POA: Diagnosis not present

## 2022-11-08 DIAGNOSIS — Z0189 Encounter for other specified special examinations: Secondary | ICD-10-CM | POA: Diagnosis not present

## 2022-11-08 DIAGNOSIS — I517 Cardiomegaly: Secondary | ICD-10-CM | POA: Insufficient documentation

## 2022-11-08 DIAGNOSIS — Z5111 Encounter for antineoplastic chemotherapy: Secondary | ICD-10-CM | POA: Insufficient documentation

## 2022-11-08 DIAGNOSIS — I071 Rheumatic tricuspid insufficiency: Secondary | ICD-10-CM | POA: Diagnosis not present

## 2022-11-08 LAB — ECHOCARDIOGRAM COMPLETE
AR max vel: 2.88 cm2
AV Area VTI: 2.96 cm2
AV Area mean vel: 2.8 cm2
AV Mean grad: 3 mmHg
AV Peak grad: 4.8 mmHg
Ao pk vel: 1.09 m/s
Area-P 1/2: 4.96 cm2
Calc EF: 50.1 %
MV VTI: 2.88 cm2
S' Lateral: 3.6 cm
Single Plane A2C EF: 46.3 %
Single Plane A4C EF: 52.4 %

## 2022-11-14 ENCOUNTER — Other Ambulatory Visit: Payer: Self-pay

## 2022-11-14 DIAGNOSIS — C8338 Diffuse large B-cell lymphoma, lymph nodes of multiple sites: Secondary | ICD-10-CM

## 2022-11-14 NOTE — Addendum Note (Signed)
Addended by: Elease Etienne A on: 11/14/2022 10:24 AM   Modules accepted: Level of Service

## 2022-11-14 NOTE — Progress Notes (Signed)
Remote ICD transmission.   

## 2022-11-15 ENCOUNTER — Inpatient Hospital Stay: Payer: Medicare HMO | Attending: Hematology | Admitting: Hematology

## 2022-11-15 ENCOUNTER — Inpatient Hospital Stay: Payer: Medicare HMO

## 2022-11-15 ENCOUNTER — Other Ambulatory Visit: Payer: Self-pay

## 2022-11-15 VITALS — BP 93/53 | HR 78 | Temp 78.0°F | Resp 15 | Wt 161.3 lb

## 2022-11-15 DIAGNOSIS — C8331 Diffuse large B-cell lymphoma, lymph nodes of head, face, and neck: Secondary | ICD-10-CM | POA: Insufficient documentation

## 2022-11-15 DIAGNOSIS — Z5112 Encounter for antineoplastic immunotherapy: Secondary | ICD-10-CM | POA: Insufficient documentation

## 2022-11-15 DIAGNOSIS — Z79899 Other long term (current) drug therapy: Secondary | ICD-10-CM | POA: Insufficient documentation

## 2022-11-15 DIAGNOSIS — C8338 Diffuse large B-cell lymphoma, lymph nodes of multiple sites: Secondary | ICD-10-CM | POA: Diagnosis not present

## 2022-11-15 DIAGNOSIS — Z7189 Other specified counseling: Secondary | ICD-10-CM | POA: Diagnosis not present

## 2022-11-15 DIAGNOSIS — Z5189 Encounter for other specified aftercare: Secondary | ICD-10-CM | POA: Insufficient documentation

## 2022-11-15 DIAGNOSIS — Z5111 Encounter for antineoplastic chemotherapy: Secondary | ICD-10-CM | POA: Insufficient documentation

## 2022-11-15 DIAGNOSIS — Z95828 Presence of other vascular implants and grafts: Secondary | ICD-10-CM | POA: Insufficient documentation

## 2022-11-15 LAB — CMP (CANCER CENTER ONLY)
ALT: 13 U/L (ref 0–44)
AST: 15 U/L (ref 15–41)
Albumin: 4.1 g/dL (ref 3.5–5.0)
Alkaline Phosphatase: 207 U/L — ABNORMAL HIGH (ref 38–126)
Anion gap: 5 (ref 5–15)
BUN: 14 mg/dL (ref 8–23)
CO2: 27 mmol/L (ref 22–32)
Calcium: 8.9 mg/dL (ref 8.9–10.3)
Chloride: 107 mmol/L (ref 98–111)
Creatinine: 0.75 mg/dL (ref 0.44–1.00)
GFR, Estimated: >60 mL/min (ref >60)
Glucose, Bld: 120 mg/dL — ABNORMAL HIGH (ref 70–99)
Potassium: 3.8 mmol/L (ref 3.5–5.1)
Sodium: 139 mmol/L (ref 135–145)
Total Bilirubin: 0.4 mg/dL (ref 0.3–1.2)
Total Protein: 6.6 g/dL (ref 6.5–8.1)

## 2022-11-15 LAB — CBC WITH DIFFERENTIAL (CANCER CENTER ONLY)
Abs Immature Granulocytes: 0.38 10*3/uL — ABNORMAL HIGH (ref 0.00–0.07)
Basophils Absolute: 0.1 10*3/uL (ref 0.0–0.1)
Basophils Relative: 1 %
Eosinophils Absolute: 0 10*3/uL (ref 0.0–0.5)
Eosinophils Relative: 0 %
HCT: 38.7 % (ref 36.0–46.0)
Hemoglobin: 12.6 g/dL (ref 12.0–15.0)
Immature Granulocytes: 4 %
Lymphocytes Relative: 7 %
Lymphs Abs: 0.8 10*3/uL (ref 0.7–4.0)
MCH: 28.8 pg (ref 26.0–34.0)
MCHC: 32.6 g/dL (ref 30.0–36.0)
MCV: 88.4 fL (ref 80.0–100.0)
Monocytes Absolute: 0.5 10*3/uL (ref 0.1–1.0)
Monocytes Relative: 4 %
Neutro Abs: 9.1 10*3/uL — ABNORMAL HIGH (ref 1.7–7.7)
Neutrophils Relative %: 84 %
Platelet Count: 83 10*3/uL — ABNORMAL LOW (ref 150–400)
RBC: 4.38 MIL/uL (ref 3.87–5.11)
RDW: 17.9 % — ABNORMAL HIGH (ref 11.5–15.5)
WBC Count: 10.8 10*3/uL — ABNORMAL HIGH (ref 4.0–10.5)
nRBC: 0 % (ref 0.0–0.2)

## 2022-11-15 LAB — LACTATE DEHYDROGENASE: LDH: 239 U/L — ABNORMAL HIGH (ref 98–192)

## 2022-11-15 MED ORDER — SODIUM CHLORIDE 0.9% FLUSH
10.0000 mL | Freq: Once | INTRAVENOUS | Status: AC
  Administered 2022-11-15: 10 mL

## 2022-11-15 MED ORDER — HEPARIN SOD (PORK) LOCK FLUSH 100 UNIT/ML IV SOLN
500.0000 [IU] | Freq: Once | INTRAVENOUS | Status: AC
  Administered 2022-11-15: 500 [IU]

## 2022-11-15 NOTE — Progress Notes (Signed)
HEMATOLOGY/ONCOLOGY CLINIC NOTE  Date of Service: 11/15/2022  Patient Care Team: Eunice Blase, PA-C as PCP - General (Internal Medicine) Marinus Maw, MD as PCP - Cardiology (Cardiology) Ebbie Ridge, MD (Family Medicine) Johney Maine, MD as Consulting Physician (Hematology)  CHIEF COMPLAINTS/PURPOSE OF CONSULTATION:  Management of newly diagnosed Diffuse large B cell lymphoma toxicity check after C1   HISTORY OF PRESENTING ILLNESS:   Rachel Bates is a wonderful 77 y.o. female who has been referred to [place] by Carylon Perches, DO for evaluation and management of non hodgkin's lymphoma. She has a hx of chronic systolic heart failure.  Nasolaryngoscopy revealed a mass lesion of the left oropharynx, emanating from the left base of tongue/glossotonsillar sulcus. Patient subsequently had a CT neck with contrast performed on 08/09/2022, which reported extensive bilateral adenopaty, with greatest lymph node conglomerate measuring 3.2 x 2.4 cm in the right level 2. Patient underwent core needle biopsy on 08/18/22 and endorsed persistent throat pain and odynophagia at the time. She is a former smoker and quit in the late 90s. SHe endorsed heavy tobacco use prior to quitting, with 1.5 pack/day for about 30 years.   Today, she is accompanied by her daughter. She complains of a sore throat beginning around Thanksgiving time. She was given antibiotics and prednisone which did not improve symptoms. She endorses swelling in her neck with associated soreness which came quickly past 2-3 weeks. She reports that her neck edema has recently grown on both the left and right sides. Her neck pain is in a specific area. She also complains of recent her neck pain if she turns her neck.  She did present Emergency EMT because something was stuck in her throat. Endoscopy revealed that it was food and it was pushed down. An ulcer was also found in her esophagus and some narrowing which will be  stretched in three months. She has not had any issues with swallowing food since then. She has not had a biopsy of issue on the back of her tongue. No other lumps/bumps, unexplained fevers, chills, night sweat, change in breathing, abdominal pain, or skin rashes. She reports discomfort with drinking tea which caused her to eat less as well.   She is allergic to Sumatriptan and another medication she cannot recall. She does not consume alcohol and has quit smoking in the 90s. 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 She believes the sore was caused by shoe discomfort.  She denies any facial puffiness, abdominal pain, recent change in bowel habits or urination. She reports that her defibrillator has never gone off.   She reports a ganglion cyst on her right knee, 2 knee replacements, and 2 hip replacements. She denies a Fhx of blood disorders. However, her sister has lung cancer and was a frequent smoker.  She is UTD with her vaccinations, incuding influenza, RSV, and COVID-19 booster. She has endorsed migraines since childhood. She lives on her own is able to complete daily activities independently. She will follow-up with Dr. Marene Lenz soon. She follows up with cardiologist regularly. Her a fib, pacemaker, and ICD device have been stable. She has not had a recent echo.   INTERVAL HISTORY:  Rachel Bates is a 77 y.o. female who presents today for follow up and management of diffuse large B-cell lymphoma.   She was last seen by me for follow up on 10/31/2022 and  reported a fall on carpet which involved head contact with a fiberglass door and breaking the fall with her left elbow. She reported pulling a muscle which caused pain in her right hamstrings and buttocks which limited her walking since the fall. She also reported mild improvement of neck edema, and occasional difficulties falling asleep.   Today, she  presents in a wheelchair and is accompanied by a family member. She reports pain especially in her right buttocks following her fall. She notes that she is not able to lift her right leg. She does use a walker in the home. She notes that she has had degenerative disc disease for a while. She notes that Tramadol and Vicodin did not improve symptoms. She reports that she will be receiving a scan next week for further evaluation of the spine.   Patient regularly takes an OTC multivitamin supplement. She denies any nausea, vomiting, or diarrhea. She reports that her eating habits have been stable and her swallowing has improved. She denies any breathing issues, abdominal pain, change in bowel habits, infection issues, fever, chills, night sweats, new lumps/bumps, new skin rashes, or new reactions form medication.  MEDICAL HISTORY:  Past Medical History:  Diagnosis Date   AICD (automatic cardioverter/defibrillator) present dual   [employer] ---  original placedment 2007/  generator change 2014 by dr gregg taylor   Anemia    Anticoagulant long-term use    eliquis   Anxiety    Arthralgia of multiple joints    Arthritis pain    Benign hypertensive heart disease    CAD (coronary artery disease) primary cardiologist-- dr gregg taylor   MI and 2 stents 1995 in [place]   Cancer [facility])    CHF NYHA class II, chronic, systolic (HCC)    followed by dr Sharlot Gowda taylor   Degenerative disc disease, lumbar    Depression    Dyslipidemia    History of basal cell carcinoma (BCC) excision    right ankle area s/p  excision in office 06/ 2019  in office   History of DVT of lower extremity yrs ago before 2012   History of MI (myocardial infarction) 1995  in [place]   History of pulmonary embolus (PE) 2012   History of ventricular tachycardia    Hyperlipidemia    Hypersomnia    Hypertension    Ischemic cardiomyopathy    Migraines    Myocardial infarction (HCC)    OA (osteoarthritis)    "all over"   Obstructive  apnea    PAF (paroxysmal atrial fibrillation) (HCC)    Primary localized osteoarthritis of right hip 12/18/2017   Primary localized osteoarthritis of right knee 05/28/2018   S/P coronary artery stent placement 1995   in [facility]   05-21-2018 per pt x2  stents in same coronary artery (unsure BM or DES)   Vitamin D deficiency disease     SURGICAL HISTORY: Past Surgical History:  Procedure Laterality Date   BIOPSY  06/19/2022   Procedure: BIOPSY;  Surgeon: Kathi Der, MD;  Location: [facility];  Service: Gastroenterology;;   CARDIAC DEFIBRILLATOR PLACEMENT  2007   CATARACT EXTRACTION, BILATERAL     COLONOSCOPY  04/14/2013   colonic polyp, status post polypectomy. Mild panocolonic diverticulosis. Small internal hemorrhoids   DILATION AND CURETTAGE OF UTERUS  yrs ago   DIRECT LARYNGOSCOPY Right 10/04/2022   Procedure: DIRECT LARYNGOSCOPY;  Surgeon: Laren Boom, DO;  Location: [facility] OR;  Service: ENT;  Laterality: Right;   ESOPHAGOGASTRODUODENOSCOPY (EGD) WITH PROPOFOL N/A  06/19/2022   Procedure: ESOPHAGOGASTRODUODENOSCOPY (EGD) WITH PROPOFOL;  Surgeon: Kathi Der, MD;  Location: [facility];  Service: Gastroenterology;  Laterality: N/A;   IMPACTION REMOVAL  06/19/2022   Procedure: IMPACTION REMOVAL;  Surgeon: Kathi Der, MD;  Location: [facility];  Service: Gastroenterology;;   IMPLANTABLE CARDIOVERTER DEFIBRILLATOR GENERATOR CHANGE N/A 02/04/2013   Procedure: IMPLANTABLE CARDIOVERTER DEFIBRILLATOR GENERATOR CHANGE;  Surgeon: Marinus Maw, MD;  Location: [facility] CATH LAB;  Service: Cardiovascular;  Laterality: N/A;   IR IMAGING GUIDED PORT INSERTION  10/27/2022   LYMPH NODE BIOPSY Right 10/04/2022   Procedure: EXCISIONAL OF RIGHT DEEP CERVICAL LYMPH NODE;  Surgeon: Laren Boom, DO;  Location: [facility] OR;  Service: ENT;  Laterality: Right;   SHOULDER ARTHROSCOPY Right 2015   TOTAL HIP ARTHROPLASTY Right 12/18/2017   Procedure: RIGHT TOTAL HIP ARTHROPLASTY;  Surgeon:  Teryl Lucy, MD;  Location: [facility] OR;  Service: Orthopedics;  Laterality: Right;   TOTAL HIP ARTHROPLASTY Left 04/19/2021   Procedure: TOTAL HIP ARTHROPLASTY;  Surgeon: Teryl Lucy, MD;  Location: [facility];  Service: Orthopedics;  Laterality: Left;   TOTAL KNEE ARTHROPLASTY Right 05/28/2018   Procedure: TOTAL KNEE ARTHROPLASTY;  Surgeon: Teryl Lucy, MD;  Location: [facility];  Service: Orthopedics;  Laterality: Right;  Adductor Block   TOTAL KNEE ARTHROPLASTY Left 08/12/2021   Procedure: TOTAL KNEE ARTHROPLASTY;  Surgeon: Teryl Lucy, MD;  Location: [facility];  Service: Orthopedics;  Laterality: Left;   TUBAL LIGATION Bilateral yrs ago   WISDOM TOOTH EXTRACTION      SOCIAL HISTORY: Social History   Socioeconomic History   Marital status: Single    Spouse name: Not on file   Number of children: Not on file   Years of education: Not on file   Highest education level: Not on file  Occupational History   Not on file  Tobacco Use   Smoking status: Former    Years: 30    Types: Cigarettes    Quit date: 05/21/1996    Years since quitting: 26.5   Smokeless tobacco: Never  Vaping Use   Vaping Use: Never used  Substance and Sexual Activity   Alcohol use: No   Drug use: Never   Sexual activity: Not on file  Other Topics Concern   Not on file  Social History Narrative   Not on file   Social Determinants of Health   Financial Resource Strain: Low Risk  (05/28/2018)   Overall Financial Resource Strain (CARDIA)    Difficulty of Paying Living Expenses: Not hard at all  Food Insecurity: No Food Insecurity (05/28/2018)   Hunger Vital Sign    Worried About Running Out of Food in the Last Year: Never true    Ran Out of Food in the Last Year: Never true  Transportation Needs: No Transportation Needs (05/28/2018)   PRAPARE - Administrator, Civil Service (Medical): No    Lack of Transportation (Non-Medical): No  Physical Activity: Not on file  Stress: Not on file  Social  Connections: Not on file  Intimate Partner Violence: Not on file    FAMILY HISTORY: Family History  Problem Relation Age of Onset   Hypertension Mother    Thyroid disease Mother    Alzheimer's disease Mother    Coronary artery disease Father    Pulmonary embolism Father    Congestive Heart Failure Maternal Grandmother    Hypertension Maternal Grandmother    Heart attack Maternal Grandfather    Other Maternal Grandfather  carotid disease   Dementia Paternal Grandmother    Other Paternal Grandfather 23       accident    ALLERGIES:  is allergic to sumatriptan succinate and amitriptyline.  MEDICATIONS:  Current Outpatient Medications  Medication Sig Dispense Refill   acetaminophen (TYLENOL) 650 MG CR tablet Take 1,300 mg by mouth 3 (three) times daily.     acyclovir (ZOVIRAX) 400 MG tablet Take 1 tablet (400 mg total) by mouth 2 (two) times daily. 30 tablet 5   allopurinol (ZYLOPRIM) 300 MG tablet Take 0.5 tablets (150 mg total) by mouth daily. 30 tablet 0   apixaban (ELIQUIS) 5 MG TABS tablet TAKE 1 TABLET BY MOUTH TWICE A DAY 60 tablet 5   b complex vitamins capsule Take 1 capsule by mouth daily.     Calcium Carb-Cholecalciferol (CALCIUM 600 + D PO) Take 1 tablet by mouth daily.     carvedilol (COREG) 12.5 MG tablet Take 12.5 mg by mouth 2 (two) times daily with a meal.      Cholecalciferol (VITAMIN D3) 125 MCG (5000 UT) capsule Take 5,000 Units by mouth daily.     cyanocobalamin (VITAMIN B12) 1000 MCG tablet Take 1,000 mcg by mouth daily.     FIBER PO Take 1 capsule by mouth 2 (two) times daily.     Fiber POWD Take 1 Scoop by mouth daily as needed (constipation).     furosemide (LASIX) 40 MG tablet Take 40 mg by mouth daily as needed for edema.     gabapentin (NEURONTIN) 300 MG capsule Take 300 mg by mouth 3 (three) times daily.     HYDROcodone-acetaminophen (NORCO/VICODIN) 5-325 MG tablet Take 1 tablet by mouth every 6 (six) hours as needed for moderate pain.      lidocaine-prilocaine (EMLA) cream Apply to affected area once 30 g 3   loratadine (CLARITIN) 10 MG tablet Take 10 mg by mouth daily.     Multiple Vitamin (STRESS FORMULA) TABS Take 1 tablet by mouth daily.     Omega-3 Fatty Acids (FISH OIL) 1000 MG CAPS Take 1,000 mg by mouth daily.      ondansetron (ZOFRAN) 8 MG tablet Take 1 tablet (8 mg total) by mouth every 8 (eight) hours as needed for nausea or vomiting. Start on day 3 after cyclophosphamide chemotherapy. 30 tablet 1   pantoprazole (PROTONIX) 40 MG tablet Take 1 tablet (40 mg total) by mouth 2 (two) times daily. 180 tablet 3   predniSONE (DELTASONE) 20 MG tablet Take 3 tablets (60 mg total) by mouth daily with breakfast. Take for 5 days now x 1 course and then for 5 days after each cycle of chemotherapy 15 tablet 6   prochlorperazine (COMPAZINE) 10 MG tablet Take 1 tablet (10 mg total) by mouth every 6 (six) hours as needed for nausea or vomiting. 30 tablet 6   ramipril (ALTACE) 2.5 MG tablet Take 2.5 mg by mouth daily.       simvastatin (ZOCOR) 40 MG tablet Take 40 mg by mouth at bedtime.       traMADol (ULTRAM) 50 MG tablet Take 1 tablet (50 mg total) by mouth every 12 (twelve) hours as needed. 30 tablet 0   traZODone (DESYREL) 50 MG tablet Take 50 mg by mouth at bedtime.      venlafaxine (EFFEXOR) 75 MG tablet Take 75 mg by mouth daily.     vitamin E 400 UNIT capsule Take 400 Units by mouth daily.     zonisamide (ZONEGRAN) 50 MG capsule  Take 50-100 mg by mouth See admin instructions. Take 50mg  by mouth in the morning and 100mg  at night.     No current facility-administered medications for this visit.    REVIEW OF SYSTEMS:    10 Point review of Systems was done is negative except as noted above.   PHYSICAL EXAMINATION: ECOG PERFORMANCE STATUS: 1 - Symptomatic but completely ambulatory .BP (!) 93/53 (BP Location: Left Arm, Patient Position: Sitting) Comment: Nurse was notify  Pulse 78   Temp (!) 78 F (25.6 C) (Temporal)   Resp 15    Wt 161 lb 4.8 oz (73.2 kg)   SpO2 97%   BMI 25.26 kg/m   GENERAL:alert, in no acute distress and comfortable SKIN: no acute rashes, no significant lesions EYES: conjunctiva are pink and non-injected, sclera anicteric OROPHARYNX: MMM, no exudates, no oropharyngeal erythema or ulceration NECK: supple, no JVD LYMPH:  no palpable lymphadenopathy in the axillary or inguinal regions. Nearly resolved rt cervical LNadenopathy. LUNGS: clear to auscultation b/l with normal respiratory effort HEART: regular rate & rhythm ABDOMEN:  normoactive bowel sounds , non tender, not distended. Extremity: no pedal edema PSYCH: alert & oriented x 3 with fluent speech NEURO: no focal motor/sensory deficits   LABORATORY DATA:  I have reviewed the data as listed    Latest Ref Rng & Units 11/20/2022    9:48 AM 11/15/2022    1:15 PM 10/30/2022    9:41 AM  CBC  WBC 4.0 - 10.5 K/uL 5.0  10.8  5.7   Hemoglobin 12.0 - 15.0 g/dL 40.9  81.1  91.4   Hematocrit 36.0 - 46.0 % 37.7  38.7  41.4   Platelets 150 - 400 K/uL 120  83  135       Latest Ref Rng & Units 11/20/2022    9:48 AM 11/15/2022    1:15 PM 10/30/2022    9:41 AM  CMP  Glucose 70 - 99 mg/dL 782  956  213   BUN 8 - 23 mg/dL 19  14  11    Creatinine 0.44 - 1.00 mg/dL 0.86  5.78  4.69   Sodium 135 - 145 mmol/L 141  139  137   Potassium 3.5 - 5.1 mmol/L 3.9  3.8  3.9   Chloride 98 - 111 mmol/L 108  107  106   CO2 22 - 32 mmol/L 26  27  24    Calcium 8.9 - 10.3 mg/dL 8.9  8.9  9.3   Total Protein 6.5 - 8.1 g/dL 6.4  6.6  6.8   Total Bilirubin 0.3 - 1.2 mg/dL 0.4  0.4  0.9   Alkaline Phos 38 - 126 U/L 200  207  80   AST 15 - 41 U/L 16  15  16    ALT 0 - 44 U/L 12  13  12     Lab Results  Component Value Date   LDH 168 10/18/2022   Molecular Pathology 10/21/2022:    Surgical Pathology 10/04/22: A. LYMPH NODE, RIGHT LEVEL 2 DEEP CERVICAL, EXCISION:  -Diffuse large B-cell lymphoma  -See comment COMMENT: The sections show diffuse effacement of the  lymph nodal architecture primarily by a population of large lymphoid cells characterized by vesicular chromatin and small nucleoli associated with apoptosis and brisk mitosis.  In some areas, the large atypical lymphoid proliferation extends into the perinodal adipose tissue.  In this background, there are scattered variably sized and somewhat disrupted aggregates of primarily small lymphoid cells characterized by high nuclear cytoplasmic ratio, angulated  nuclear contours and small to inconspicuous nucleoli. Flow cytometric analysis was performed [facility] [phone]) and shows a monoclonal, lambda restricted B-cell population expressing CD10.  In addition, immunohistochemical stains for CD3, CD5, CD10, CD20, PAX5, BCL6, Bcl-2, Ki-67, CD30, CD138, CD21, EBV in addition to in situ hybridization for kappa and lambda were performed with appropriate controls.  The large lymphoid cells are positive for CD20, PAX5, CD10, BCL6, Bcl-2, and partially for cytoplasmic lambda.  No significant staining is seen with EBV, CD30, CD138 or cytoplasmic kappa.  Ki67 shows variably increased expression (more than 50% in some areas). CD21 highlights scattered somewhat disrupted follicular dendritic networks. The lymphoid aggregates of primarily small lymphoid cells previously described show positivity for B-cell markers CD20 and PAX5 in addition to CD10 and Bcl-2.  There is an admixed variable T-cell component in the background as seen with CD3 and CD5 and there is no apparent co-expression of CD5 in B-cell areas.  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   RADIOGRAPHIC STUDIES: I have personally reviewed the radiological images as listed and agreed with the findings in the report. ECHOCARDIOGRAM COMPLETE  Result Date: 11/08/2022    ECHOCARDIOGRAM REPORT   Patient Name:   HISAYO FORST Date of Exam: 11/08/2022 Medical Rec #:  [id number]       Height:       67.0 in Accession #:    [id number]      Weight:       159.5 lb Date of Birth:  [birth year]       BSA:          1.837 m Patient Age:    76 years        BP:           119/68 mmHg Patient Gender: F               HR:           74 bpm. Exam Location:  Outpatient Procedure: 2D Echo, Cardiac Doppler, Color Doppler and Strain Analysis Indications:    Chemo  History:        Patient has no prior history of Echocardiogram examinations.                 Cardiomyopathy and CHF, Previous Myocardial Infarction;                 Arrythmias:Atrial Fibrillation.  Sonographer:    Wallie Char Referring Phys: [id number] Johney Maine  Sonographer Comments: No RN available to start IV to administer Definity IMPRESSIONS  1. Left ventricular ejection fraction, by estimation, is 45 to 50%. The left ventricle has mildly decreased function. The left ventricle demonstrates regional wall motion abnormalities (see scoring diagram/findings for description). Left ventricular diastolic parameters are consistent with Grade II diastolic dysfunction (pseudonormalization). Elevated left atrial pressure. The average left ventricular global longitudinal strain is -15.9 %. The global longitudinal strain is abnormal.  2. Right ventricular systolic function is normal. The right ventricular size is normal.  3. The mitral valve is normal in structure. No evidence of mitral valve regurgitation.  4. The aortic valve is tricuspid. Aortic valve regurgitation is not visualized. No aortic stenosis is present. FINDINGS  Left Ventricle: Left ventricular ejection fraction, by estimation, is 45 to 50%. The left ventricle has mildly decreased function. The left ventricle demonstrates regional wall motion abnormalities. The average left ventricular  global longitudinal strain is -15.9 %. The global longitudinal strain is abnormal. The left ventricular internal cavity size was normal in size.  There is borderline left ventricular hypertrophy. Left ventricular diastolic parameters are consistent with Grade II diastolic dysfunction (pseudonormalization). Elevated left atrial pressure.  LV Wall Scoring: The apical septal segment, apical inferior segment, and apex are dyskinetic. The mid anteroseptal segment and apical anterior segment are hypokinetic. The anterior wall, entire lateral wall, inferior wall, basal anteroseptal segment, mid inferoseptal segment, and basal inferoseptal segment are normal. Right Ventricle: The right ventricular size is normal. No increase in right ventricular wall thickness. Right ventricular systolic function is normal. Left Atrium: Left atrial size was normal in size. Right Atrium: Right atrial size was normal in size. Pericardium: There is no evidence of pericardial effusion. Mitral Valve: The mitral valve is normal in structure. No evidence of mitral valve regurgitation. MV peak gradient, 3.9 mmHg. The mean mitral valve gradient is 1.0 mmHg. Tricuspid Valve: The tricuspid valve is normal in structure. Tricuspid valve regurgitation is mild. Aortic Valve: The aortic valve is tricuspid. Aortic valve regurgitation is not visualized. No aortic stenosis is present. Aortic valve mean gradient measures 3.0 mmHg. Aortic valve peak gradient measures 4.8 mmHg. Aortic valve area, by VTI measures 2.96 cm. Pulmonic Valve: The pulmonic valve was not well visualized. Pulmonic valve regurgitation is not visualized. No evidence of pulmonic stenosis. Aorta: The aortic root is normal in size and structure. IAS/Shunts: No atrial level shunt detected by color flow Doppler. Additional Comments: A device lead is visualized in the right ventricle.  LEFT VENTRICLE PLAX 2D LVIDd:         5.20 cm      Diastology LVIDs:         3.60 cm      LV e' medial:    7.74 cm/s LV PW:         0.80 cm      LV E/e' medial:  11.8 LV IVS:        0.70 cm      LV e' lateral:   9.00 cm/s LVOT diam:     2.10 cm      LV  E/e' lateral: 10.2 LV SV:         68 LV SV Index:   37           2D Longitudinal Strain LVOT Area:     3.46 cm     2D Strain GLS Avg:     -15.9 %  LV Volumes (MOD) LV vol d, MOD A2C: 101.0 ml LV vol d, MOD A4C: 107.0 ml LV vol s, MOD A2C: 54.2 ml LV vol s, MOD A4C: 50.9 ml LV SV MOD A2C:     46.8 ml LV SV MOD A4C:     107.0 ml LV SV MOD BP:      53.2 ml RIGHT VENTRICLE             IVC RV S prime:     14.20 cm/s  IVC diam: 2.20 cm TAPSE (M-mode): 1.8 cm LEFT ATRIUM             Index        RIGHT ATRIUM           Index LA diam:        3.70 cm 2.01 cm/m   RA Area:     16.60 cm LA Vol (A2C):   53.1 ml 28.91 ml/m  RA Volume:   42.50  ml  23.14 ml/m LA Vol (A4C):   46.0 ml 25.04 ml/m LA Biplane Vol: 50.6 ml 27.55 ml/m  AORTIC VALVE AV Area (Vmax):    2.88 cm AV Area (Vmean):   2.80 cm AV Area (VTI):     2.96 cm AV Vmax:           109.00 cm/s AV Vmean:          77.350 cm/s AV VTI:            0.231 m AV Peak Grad:      4.8 mmHg AV Mean Grad:      3.0 mmHg LVOT Vmax:         90.60 cm/s LVOT Vmean:        62.450 cm/s LVOT VTI:          0.198 m LVOT/AV VTI ratio: 0.85  AORTA Ao Root diam: 3.50 cm Ao Asc diam:  3.50 cm MITRAL VALVE                TRICUSPID VALVE MV Area (PHT): 4.96 cm     TR Peak grad:   31.1 mmHg MV Area VTI:   2.88 cm     TR Vmax:        279.00 cm/s MV Peak grad:  3.9 mmHg MV Mean grad:  1.0 mmHg     SHUNTS MV Vmax:       0.98 m/s     Systemic VTI:  0.20 m MV Vmean:      54.8 cm/s    Systemic Diam: 2.10 cm MV Decel Time: 153 msec MV E velocity: 91.50 cm/s MV A velocity: 101.00 cm/s MV E/A ratio:  0.91 Mihai Croitoru MD Electronically signed by Thurmon Fair MD Signature Date/Time: 11/08/2022/5:12:37 PM    Final    CUP PACEART REMOTE DEVICE CHECK  Result Date: 11/07/2022 Monthly battery check.  Estimated 22mo Normal device function. No new alerts.  HF diagnostics currently abnormal Follow up as scheduled monthly LA, CVRS  IR IMAGING GUIDED PORT INSERTION  Result Date: 10/27/2022 INDICATION:  Lymphoma. In need of durable intravenous access for chemotherapy administration. EXAM: IMPLANTED PORT A CATH PLACEMENT WITH ULTRASOUND AND FLUOROSCOPIC GUIDANCE COMPARISON:  PET-CT-09/04/2022 MEDICATIONS: None ANESTHESIA/SEDATION: Moderate (conscious) sedation was employed during this procedure as administered by the Interventional Radiology RN. A total of Versed 2 mg and Fentanyl 100 mcg was administered intravenously. Moderate Sedation Time: 24 minutes. The patient's level of consciousness and vital signs were monitored continuously by radiology nursing throughout the procedure under my direct supervision. CONTRAST:  None FLUOROSCOPY TIME:  54 seconds (2 mGy) COMPLICATIONS: None immediate. PROCEDURE: The procedure, risks, benefits, and alternatives were explained to the patient. Questions regarding the procedure were encouraged and answered. The patient understands and consents to the procedure. Sonographic evaluation of the neck demonstrates bulky malignant adenopathy bilaterally. Given the presence of the left anterior chest wall AICD/pacemaker of the, the decision was made to proceed with right internal jugular approach port a catheter placement. As such, the right neck and chest were prepped with chlorhexidine in a sterile fashion, and a sterile drape was applied covering the operative field. Maximum barrier sterile technique with sterile gowns and gloves were used for the procedure. A timeout was performed prior to the initiation of the procedure. Local anesthesia was provided with 1% lidocaine with epinephrine. After creating a small venotomy incision, a micropuncture kit was utilized to access the internal jugular vein. Real-time ultrasound guidance was utilized for vascular access including  the acquisition of a permanent ultrasound image documenting patency of the accessed vessel. The microwire was utilized to measure appropriate catheter length. A subcutaneous port pocket was then created along the upper  chest wall utilizing a combination of sharp and blunt dissection. The pocket was irrigated with sterile saline. A single lumen Slim sized power injectable port was chosen for placement. The 8 Fr catheter was tunneled from the port pocket site to the venotomy incision. The port was placed in the pocket. The external catheter was trimmed to appropriate length. At the venotomy, an 8 Fr peel-away sheath was placed over a guidewire under fluoroscopic guidance. The catheter was then placed through the sheath and the sheath was removed. Final catheter positioning was confirmed and documented with a fluoroscopic spot radiograph. The port was accessed with a Huber needle, aspirated and flushed with heparinized saline. The venotomy site was closed with an interrupted 4-0 Vicryl suture. The port pocket incision was closed with interrupted 2-0 Vicryl suture. Dermabond and Steri-strips were applied to both incisions. Dressings were applied. The patient tolerated the procedure well without immediate post procedural complication. FINDINGS: Sonographic evaluation demonstrates bulky bilateral malignant appearing cervical lymphadenopathy, findings compatible with recent PET-CT. After catheter placement, the tip lies within the superior cavoatrial junction. The catheter aspirates and flushes normally and is ready for immediate use. IMPRESSION: Successful placement of a right internal jugular approach power injectable Port-A-Cath. The catheter is ready for immediate use. Electronically Signed   By: Simonne Come M.D.   On: 10/27/2022 17:35    NM PET 09/04/2022: IMPRESSION: 1. There is a large tracer avid mass centered within the left base of tongue and left lingual region which extends into the hypopharyngeal region ventral to the epiglottis. Imaging findings are compatible with a primary head and neck malignancy. 2. A smaller focus of increased uptake localizes to the right pharyngeal tonsil region. Indeterminate. 3. Bilateral  tracer avid cervical and supraclavicular lymph nodes compatible with nodal metastasis. 4. There is a 7 mm tracer avid nodule within the anteromedial left upper lobe which is suspicious for pulmonary metastasis. 5. There is increased uptake identified along the long axis of the posterior right seventh rib. No corresponding lytic or sclerotic changes identified on the CT images. Cannot exclude bone metastases. 6. Multiple prominent retroperitoneal and mesenteric lymph nodes are identified which exhibit mild tracer uptake. This is a nonspecific finding and may reflect reactive adenopathy. Metastatic adenopathy cannot be excluded. Attention on future surveillance imaging is advised. 7.  Aortic Atherosclerosis (ICD10-I70.0).   ASSESSMENT & PLAN:   77 y.o.  female with:   1. 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 2. 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 CBC showed WBC of 10.8K, hemoglobin of 12.6, and platelets low at 83K. -no anemia -CMP normal -discussed molecular pathology results which ruled out double-hit lymphoma -continue with R-CEOP regimen--without doxorubicin given her  CHF history, substituting Etoposide instead. -Continue Prednisone regimen with treatment. 60mg  daily x 5 days. She is tolerating this well overall. -Continue scheduled Tylenol  -Will repeat PET scan after 3 cycles -answered all of patient's questions in detail -discussed option of Lidocaine patch 12 hours on, 12 hours off, for local pain control as well as Voltaren  gel -Advised patient to regularly drink 2L of water, consume a well-balanced diet, and regularly stay active -advised  patient not to take her OTC multivitamin supplement on days she takes chemotherapy as it is not FDA approved -discussed option of SI brace to aid in moving around -Discussed option of CT scan of back for further evaluation -discussed goal to get patient to move around regularly soon to prevent any complications such as pneumonia or blood clots -Patient will receive scan next week for further evaluation of the spine  -Patient has an orthopedic follow-up visit next Wednesday  FOLLOW-UP: Per integrated schedyuling  The total time spent in the appointment was 32 minutes* .  All of the patient's questions were answered with apparent satisfaction. The patient knows to call the clinic with any problems, questions or concerns.   Wyvonnia Lora MD MS AAHIVMS [facility] Hematology/Oncology Physician [facility]  .*Total Encounter Time as defined by the Centers for Medicare and Medicaid Services includes, in addition to the face-to-face time of a patient visit (documented in the note above) non-face-to-face time: obtaining and reviewing outside history, ordering and reviewing medications, tests or procedures, care coordination (communications with other health care professionals or caregivers) and documentation in the medical record.    I,Mitra Faeizi,acting as a Neurosurgeon for Wyvonnia Lora, MD.,have documented all relevant documentation on the behalf of Wyvonnia Lora, MD,as directed by  Wyvonnia Lora, MD while in the presence of Wyvonnia Lora, MD.  .I have reviewed the above documentation for accuracy and completeness, and I agree with the above. Johney Maine MD

## 2022-11-17 MED FILL — Dexamethasone Sodium Phosphate Inj 100 MG/10ML: INTRAMUSCULAR | Qty: 1 | Status: AC

## 2022-11-20 ENCOUNTER — Inpatient Hospital Stay: Payer: Medicare HMO

## 2022-11-20 ENCOUNTER — Inpatient Hospital Stay (HOSPITAL_BASED_OUTPATIENT_CLINIC_OR_DEPARTMENT_OTHER): Payer: Medicare HMO | Admitting: Hematology

## 2022-11-20 ENCOUNTER — Other Ambulatory Visit: Payer: Self-pay

## 2022-11-20 VITALS — BP 120/53 | HR 78 | Temp 98.8°F | Resp 18 | Ht 67.0 in | Wt 158.0 lb

## 2022-11-20 DIAGNOSIS — Z5111 Encounter for antineoplastic chemotherapy: Secondary | ICD-10-CM

## 2022-11-20 DIAGNOSIS — Z7189 Other specified counseling: Secondary | ICD-10-CM

## 2022-11-20 DIAGNOSIS — Z95828 Presence of other vascular implants and grafts: Secondary | ICD-10-CM

## 2022-11-20 DIAGNOSIS — C8338 Diffuse large B-cell lymphoma, lymph nodes of multiple sites: Secondary | ICD-10-CM

## 2022-11-20 DIAGNOSIS — Z5112 Encounter for antineoplastic immunotherapy: Secondary | ICD-10-CM | POA: Diagnosis not present

## 2022-11-20 DIAGNOSIS — Z79899 Other long term (current) drug therapy: Secondary | ICD-10-CM | POA: Diagnosis not present

## 2022-11-20 DIAGNOSIS — Z5189 Encounter for other specified aftercare: Secondary | ICD-10-CM | POA: Diagnosis not present

## 2022-11-20 DIAGNOSIS — C8331 Diffuse large B-cell lymphoma, lymph nodes of head, face, and neck: Secondary | ICD-10-CM | POA: Diagnosis not present

## 2022-11-20 LAB — CBC WITH DIFFERENTIAL (CANCER CENTER ONLY)
Abs Immature Granulocytes: 0.03 10*3/uL (ref 0.00–0.07)
Basophils Absolute: 0 10*3/uL (ref 0.0–0.1)
Basophils Relative: 1 %
Eosinophils Absolute: 0 10*3/uL (ref 0.0–0.5)
Eosinophils Relative: 0 %
HCT: 37.7 % (ref 36.0–46.0)
Hemoglobin: 12.5 g/dL (ref 12.0–15.0)
Immature Granulocytes: 1 %
Lymphocytes Relative: 15 %
Lymphs Abs: 0.7 10*3/uL (ref 0.7–4.0)
MCH: 29.1 pg (ref 26.0–34.0)
MCHC: 33.2 g/dL (ref 30.0–36.0)
MCV: 87.9 fL (ref 80.0–100.0)
Monocytes Absolute: 0.5 10*3/uL (ref 0.1–1.0)
Monocytes Relative: 10 %
Neutro Abs: 3.7 10*3/uL (ref 1.7–7.7)
Neutrophils Relative %: 73 %
Platelet Count: 120 10*3/uL — ABNORMAL LOW (ref 150–400)
RBC: 4.29 MIL/uL (ref 3.87–5.11)
RDW: 17.8 % — ABNORMAL HIGH (ref 11.5–15.5)
WBC Count: 5 10*3/uL (ref 4.0–10.5)
nRBC: 0 % (ref 0.0–0.2)

## 2022-11-20 LAB — CMP (CANCER CENTER ONLY)
ALT: 12 U/L (ref 0–44)
AST: 16 U/L (ref 15–41)
Albumin: 4.1 g/dL (ref 3.5–5.0)
Alkaline Phosphatase: 200 U/L — ABNORMAL HIGH (ref 38–126)
Anion gap: 7 (ref 5–15)
BUN: 19 mg/dL (ref 8–23)
CO2: 26 mmol/L (ref 22–32)
Calcium: 8.9 mg/dL (ref 8.9–10.3)
Chloride: 108 mmol/L (ref 98–111)
Creatinine: 0.63 mg/dL (ref 0.44–1.00)
GFR, Estimated: >60 mL/min (ref >60)
Glucose, Bld: 109 mg/dL — ABNORMAL HIGH (ref 70–99)
Potassium: 3.9 mmol/L (ref 3.5–5.1)
Sodium: 141 mmol/L (ref 135–145)
Total Bilirubin: 0.4 mg/dL (ref 0.3–1.2)
Total Protein: 6.4 g/dL — ABNORMAL LOW (ref 6.5–8.1)

## 2022-11-20 MED ORDER — PALONOSETRON HCL INJECTION 0.25 MG/5ML
0.2500 mg | Freq: Once | INTRAVENOUS | Status: AC
  Administered 2022-11-20: 0.25 mg via INTRAVENOUS
  Filled 2022-11-20: qty 5

## 2022-11-20 MED ORDER — SODIUM CHLORIDE 0.9 % IV SOLN: Freq: Once | INTRAVENOUS | Status: AC

## 2022-11-20 MED ORDER — SODIUM CHLORIDE 0.9% FLUSH
10.0000 mL | Freq: Once | INTRAVENOUS | Status: AC
  Administered 2022-11-20: 10 mL

## 2022-11-20 MED ORDER — VINCRISTINE SULFATE CHEMO INJECTION 1 MG/ML
1.0000 mg | Freq: Once | INTRAVENOUS | Status: AC
  Administered 2022-11-20: 1 mg via INTRAVENOUS
  Filled 2022-11-20: qty 1

## 2022-11-20 MED ORDER — DIPHENHYDRAMINE HCL 25 MG PO CAPS
50.0000 mg | ORAL_CAPSULE | Freq: Once | ORAL | Status: AC
  Administered 2022-11-20: 50 mg via ORAL
  Filled 2022-11-20: qty 2

## 2022-11-20 MED ORDER — HEPARIN SOD (PORK) LOCK FLUSH 100 UNIT/ML IV SOLN
500.0000 [IU] | Freq: Once | INTRAVENOUS | Status: AC | PRN
  Administered 2022-11-20: 500 [IU]

## 2022-11-20 MED ORDER — SODIUM CHLORIDE 0.9 % IV SOLN
375.0000 mg/m2 | Freq: Once | INTRAVENOUS | Status: AC
  Administered 2022-11-20: 700 mg via INTRAVENOUS
  Filled 2022-11-20: qty 50

## 2022-11-20 MED ORDER — SODIUM CHLORIDE 0.9 % IV SOLN
50.0000 mg/m2 | Freq: Once | INTRAVENOUS | Status: AC
  Administered 2022-11-20: 100 mg via INTRAVENOUS
  Filled 2022-11-20: qty 5

## 2022-11-20 MED ORDER — SODIUM CHLORIDE 0.9% FLUSH
10.0000 mL | INTRAVENOUS | Status: DC | PRN
  Administered 2022-11-20: 10 mL

## 2022-11-20 MED ORDER — SODIUM CHLORIDE 0.9 % IV SOLN
10.0000 mg | Freq: Once | INTRAVENOUS | Status: AC
  Administered 2022-11-20: 10 mg via INTRAVENOUS
  Filled 2022-11-20: qty 10

## 2022-11-20 MED ORDER — FAMOTIDINE 20 MG IN NS 100 ML IVPB
20.0000 mg | Freq: Once | INTRAVENOUS | Status: AC
  Administered 2022-11-20: 20 mg via INTRAVENOUS
  Filled 2022-11-20: qty 100

## 2022-11-20 MED ORDER — ACETAMINOPHEN 325 MG PO TABS
650.0000 mg | ORAL_TABLET | Freq: Once | ORAL | Status: AC
  Administered 2022-11-20: 650 mg via ORAL
  Filled 2022-11-20: qty 2

## 2022-11-20 MED ORDER — SODIUM CHLORIDE 0.9 % IV SOLN
500.0000 mg/m2 | Freq: Once | INTRAVENOUS | Status: AC
  Administered 2022-11-20: 1000 mg via INTRAVENOUS
  Filled 2022-11-20: qty 50

## 2022-11-20 NOTE — Patient Instructions (Signed)
[facility]  Discharge Instructions: Thank you for choosing [facility] to provide your oncology and hematology care.   If you have a lab appointment with the Cancer Center, please go directly to the Cancer Center and check in at the registration area.   Wear comfortable clothing and clothing appropriate for easy access to any Portacath or PICC line.   We strive to give you quality time with your provider. You may need to reschedule your appointment if you arrive late (15 or more minutes).  Arriving late affects you and other patients whose appointments are after yours.  Also, if you miss three or more appointments without notifying the office, you may be dismissed from the clinic at the provider's discretion.      For prescription refill requests, have your pharmacy contact our office and allow 72 hours for refills to be completed.    Today you received the following chemotherapy and/or immunotherapy agents: Vincristine/Cytoxan/Etoposide/Ruxience     To help prevent nausea and vomiting after your treatment, we encourage you to take your nausea medication as directed.  BELOW ARE SYMPTOMS THAT SHOULD BE REPORTED IMMEDIATELY: *FEVER GREATER THAN 100.4 F (38 C) OR HIGHER *CHILLS OR SWEATING *NAUSEA AND VOMITING THAT IS NOT CONTROLLED WITH YOUR NAUSEA MEDICATION *UNUSUAL SHORTNESS OF BREATH *UNUSUAL BRUISING OR BLEEDING *URINARY PROBLEMS (pain or burning when urinating, or frequent urination) *BOWEL PROBLEMS (unusual diarrhea, constipation, pain near the anus) TENDERNESS IN MOUTH AND THROAT WITH OR WITHOUT PRESENCE OF ULCERS (sore throat, sores in mouth, or a toothache) UNUSUAL RASH, SWELLING OR PAIN  UNUSUAL VAGINAL DISCHARGE OR ITCHING   Items with * indicate a potential emergency and should be followed up as soon as possible or go to the Emergency Department if any problems should occur.  Please show the CHEMOTHERAPY ALERT CARD or  IMMUNOTHERAPY ALERT CARD at check-in to the Emergency Department and triage nurse.  Should you have questions after your visit or need to cancel or reschedule your appointment, please contact [facility]  Dept: [phone]  and follow the prompts.  Office hours are 8:00 a.m. to 4:30 p.m. Monday - Friday. Please note that voicemails left after 4:00 p.m. may not be returned until the following business day.  We are closed weekends and major holidays. You have access to a nurse at all times for urgent questions. Please call the main number to the clinic Dept: [phone] and follow the prompts.   For any non-urgent questions, you may also contact your provider using MyChart. We now offer e-Visits for anyone 18 and older to request care online for non-urgent symptoms. For details visit [email or web address].   Also download the MyChart app! Go to the app store, search "MyChart", open the app, select [facility], and log in with your MyChart username and password.   

## 2022-11-20 NOTE — Progress Notes (Signed)
Pharmacist Chemotherapy Monitoring - Initial Assessment    Anticipated start date: 11/20/22   The following has been reviewed per standard work regarding the patient's treatment regimen: The patient's diagnosis, treatment plan and drug doses, and organ/hematologic function Lab orders and baseline tests specific to treatment regimen  The treatment plan start date, drug sequencing, and pre-medications Prior authorization status  Patient's documented medication list, including drug-drug interaction screen and prescriptions for anti-emetics and supportive care specific to the treatment regimen The drug concentrations, fluid compatibility, administration routes, and timing of the medications to be used The patient's access for treatment and lifetime cumulative dose history, if applicable  The patient's medication allergies and previous infusion related reactions, if applicable   Changes made to treatment plan:  N/A  Follow up needed:  ECHO 45-50% - MD giving R-ECO P due to CHF history   Rachel Bates, [facility], 11/20/2022  11:06 AM

## 2022-11-20 NOTE — Progress Notes (Signed)
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 female who has been referred to [place] by Carylon Perches, DO for evaluation and management of non hodgkin's lymphoma. She has a hx of chronic systolic heart failure.  Nasolaryngoscopy revealed a mass lesion of the left oropharynx, emanating from the left base of tongue/glossotonsillar sulcus. Patient subsequently had a CT neck with contrast performed on 08/09/2022, which reported extensive bilateral adenopaty, with greatest lymph node conglomerate measuring 3.2 x 2.4 cm in the right level 2. Patient underwent core needle biopsy on 08/18/22 and endorsed persistent throat pain and odynophagia at the time. She is a former smoker and quit in the late 90s. SHe endorsed heavy tobacco use prior to quitting, with 1.5 pack/day for about 30 years.   Today, she is accompanied by her daughter. She complains of a sore throat beginning around Thanksgiving time. She was given antibiotics and prednisone which did not improve symptoms. She endorses swelling in her neck with associated soreness which came quickly past 2-3 weeks. She reports that her neck edema has recently grown on both the left and right sides. Her neck pain is in a specific area. She also complains of recent her neck pain if she turns her neck.  She did present Emergency EMT because something was stuck in her throat. Endoscopy revealed that it was food and it was pushed down. An ulcer was also found in her esophagus and some narrowing which will be stretched in three  months. She has not had any issues with swallowing food since then. She has not had a biopsy of issue on the back of her tongue. No other lumps/bumps, unexplained fevers, chills, night sweat, change in breathing, abdominal pain, or skin rashes. She reports discomfort with drinking tea which caused her to eat less as well.   She is allergic to Sumatriptan and another medication she cannot recall. She does not consume alcohol and has quit smoking in the 90s. 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 She believes the sore was caused by shoe discomfort.  She denies any facial puffiness, abdominal pain, recent change in bowel habits or urination. She reports that her defibrillator has never gone off.   She reports a ganglion cyst on her right knee, 2 knee replacements, and 2 hip replacements. She denies a Fhx of blood disorders. However, her sister has lung cancer and was a frequent smoker.  She is UTD with her vaccinations, incuding influenza, RSV, and COVID-19 booster. She has endorsed migraines since childhood. She lives on her own is able to complete daily activities independently. She will follow-up with Dr. Marene Lenz soon. She follows up with cardiologist regularly. Her a fib, pacemaker, and ICD device have been stable. She has not had a recent echo.   INTERVAL HISTORY:  Rachel Bates is a 77 y.o. female who presents today for follow up and management of diffuse large B-cell lymphoma. She is here for cycle 2 day 1 of her treatment during this visit.   Patient  was last seen by me on 10/31/2022 and she complained of right leg pain radiating along her hamstring into her gluteal region.  Patient reports she has been doing well overall without any new or severe medical concerns since our last visit. She does complain of continuous bilateral hip pain. She denies fever, chills, night sweats, nausea, abnormal  bowel movement, back pain, chest pain, abdominal pain, or leg swelling.   She notes she tolerated cycle 1 of her treatment well without any new or severe toxicities.   MEDICAL HISTORY:  Past Medical History:  Diagnosis Date   AICD (automatic cardioverter/defibrillator) present dual   [employer] ---  original placedment 2007/  generator change 2014 by dr gregg taylor   Anemia    Anticoagulant long-term use    eliquis   Anxiety    Arthralgia of multiple joints    Arthritis pain    Benign hypertensive heart disease    CAD (coronary artery disease) primary cardiologist-- dr gregg taylor   MI and 2 stents 1995 in [place]   Cancer    CHF NYHA class II, chronic, systolic    followed by dr Sharlot Gowda taylor   Degenerative disc disease, lumbar    Depression    Dyslipidemia    History of basal cell carcinoma (BCC) excision    right ankle area s/p  excision in office 06/ 2019  in office   History of DVT of lower extremity yrs ago before 2012   History of MI (myocardial infarction) 1995  in [place]   History of pulmonary embolus (PE) 2012   History of ventricular tachycardia    Hyperlipidemia    Hypersomnia    Hypertension    Ischemic cardiomyopathy    Migraines    Myocardial infarction    OA (osteoarthritis)    "all over"   Obstructive apnea    PAF (paroxysmal atrial fibrillation)    Primary localized osteoarthritis of right hip 12/18/2017   Primary localized osteoarthritis of right knee 05/28/2018   S/P coronary artery stent placement 1995   in [facility]   05-21-2018 per pt x2  stents in same coronary artery (unsure BM or DES)   Vitamin D deficiency disease     SURGICAL HISTORY: Past Surgical History:  Procedure Laterality Date   BIOPSY  06/19/2022   Procedure: BIOPSY;  Surgeon: Kathi Der, MD;  Location: [facility];  Service: Gastroenterology;;   CARDIAC DEFIBRILLATOR PLACEMENT  2007   CATARACT EXTRACTION, BILATERAL     COLONOSCOPY  04/14/2013   colonic polyp, status post  polypectomy. Mild panocolonic diverticulosis. Small internal hemorrhoids   DILATION AND CURETTAGE OF UTERUS  yrs ago   DIRECT LARYNGOSCOPY Right 10/04/2022   Procedure: DIRECT LARYNGOSCOPY;  Surgeon: Cheron Schaumann A, DO;  Location: [facility];  Service: ENT;  Laterality: Right;   ESOPHAGOGASTRODUODENOSCOPY (EGD) WITH PROPOFOL N/A 06/19/2022   Procedure: ESOPHAGOGASTRODUODENOSCOPY (EGD) WITH PROPOFOL;  Surgeon: Kathi Der, MD;  Location: [facility];  Service: Gastroenterology;  Laterality: N/A;   IMPACTION REMOVAL  06/19/2022   Procedure: IMPACTION REMOVAL;  Surgeon: Kathi Der, MD;  Location: [facility];  Service: Gastroenterology;;   IMPLANTABLE CARDIOVERTER DEFIBRILLATOR GENERATOR CHANGE N/A 02/04/2013   Procedure: IMPLANTABLE CARDIOVERTER DEFIBRILLATOR GENERATOR CHANGE;  Surgeon: Marinus Maw, MD;  Location: [facility] CATH [facility];  Service: Cardiovascular;  Laterality: N/A;   IR IMAGING GUIDED PORT INSERTION  10/27/2022   LYMPH NODE BIOPSY Right 10/04/2022   Procedure: EXCISIONAL OF RIGHT DEEP CERVICAL LYMPH NODE;  Surgeon: Cheron Schaumann A, DO;  Location: [facility];  Service: ENT;  Laterality: Right;   SHOULDER ARTHROSCOPY Right 2015   TOTAL HIP ARTHROPLASTY Right 12/18/2017   Procedure: RIGHT TOTAL HIP ARTHROPLASTY;  Surgeon: Teryl Lucy, MD;  Location: [facility];  Service: Orthopedics;  Laterality: Right;   TOTAL HIP ARTHROPLASTY Left 04/19/2021   Procedure: TOTAL HIP ARTHROPLASTY;  Surgeon: Teryl Lucy, MD;  Location: [facility];  Service: Orthopedics;  Laterality: Left;   TOTAL KNEE ARTHROPLASTY Right 05/28/2018   Procedure: TOTAL KNEE ARTHROPLASTY;  Surgeon: Teryl Lucy, MD;  Location: [facility];  Service: Orthopedics;  Laterality: Right;  Adductor Block   TOTAL KNEE ARTHROPLASTY Left 08/12/2021   Procedure: TOTAL KNEE ARTHROPLASTY;  Surgeon: Teryl Lucy, MD;  Location: [facility];  Service: Orthopedics;  Laterality: Left;   TUBAL LIGATION Bilateral yrs ago   WISDOM TOOTH EXTRACTION       SOCIAL HISTORY: Social History   Socioeconomic History   Marital status: Single    Spouse name: Not on file   Number of children: Not on file   Years of education: Not on file   Highest education level: Not on file  Occupational History   Not on file  Tobacco Use   Smoking status: Former    Years: 30    Types: Cigarettes    Quit date: 05/21/1996    Years since quitting: 26.4   Smokeless tobacco: Never  Vaping Use   Vaping Use: Never used  Substance and Sexual Activity   Alcohol use: No   Drug use: Never   Sexual activity: Not on file  Other Topics Concern   Not on file  Social History Narrative   Not on file   Social Determinants of Health   Financial Resource Strain: Low Risk  (05/28/2018)   Overall Financial Resource Strain (CARDIA)    Difficulty of Paying Living Expenses: Not hard at all  Food Insecurity: No Food Insecurity (05/28/2018)   Hunger Vital Sign    Worried About Running Out of Food in the Last Year: Never true    Ran Out of Food in the Last Year: Never true  Transportation Needs: No Transportation Needs (05/28/2018)   PRAPARE - Administrator, Civil Service (Medical): No    Lack of Transportation (Non-Medical): No  Physical Activity: Not on file  Stress: Not on file  Social Connections: Not on file  Intimate Partner Violence: Not on file    FAMILY HISTORY: Family History  Problem Relation Age of Onset   Hypertension Mother    Thyroid disease Mother    Alzheimer's disease Mother    Coronary artery disease Father    Pulmonary embolism Father    Congestive Heart Failure Maternal Grandmother    Hypertension Maternal Grandmother    Heart attack Maternal Grandfather    Other Maternal Grandfather        carotid disease   Dementia Paternal Grandmother    Other Paternal Grandfather 67       accident    ALLERGIES:  is allergic to sumatriptan succinate and amitriptyline.  MEDICATIONS:  Current Outpatient Medications  Medication  Sig Dispense Refill   acetaminophen (TYLENOL) 650 MG CR tablet Take 1,300 mg by mouth 3 (three) times daily.     acyclovir (ZOVIRAX) 400 MG tablet Take 1 tablet (400 mg total) by mouth 2 (two) times daily. 30 tablet 5   allopurinol (ZYLOPRIM) 300 MG tablet Take 0.5 tablets (150 mg total) by mouth daily. 30 tablet 0   apixaban (ELIQUIS) 5 MG  TABS tablet TAKE 1 TABLET BY MOUTH TWICE A DAY 60 tablet 5   b complex vitamins capsule Take 1 capsule by mouth daily.     Calcium Carb-Cholecalciferol (CALCIUM 600 + D PO) Take 1 tablet by mouth daily.     carvedilol (COREG) 12.5 MG tablet Take 12.5 mg by mouth 2 (two) times daily with a meal.      Cholecalciferol (VITAMIN D3) 125 MCG (5000 UT) capsule Take 5,000 Units by mouth daily.     cyanocobalamin (VITAMIN B12) 1000 MCG tablet Take 1,000 mcg by mouth daily.     FIBER PO Take 1 capsule by mouth 2 (two) times daily.     Fiber POWD Take 1 Scoop by mouth daily as needed (constipation).     furosemide (LASIX) 40 MG tablet Take 40 mg by mouth daily as needed for edema.     gabapentin (NEURONTIN) 300 MG capsule Take 300 mg by mouth 3 (three) times daily.     lidocaine-prilocaine (EMLA) cream Apply to affected area once 30 g 3   loratadine (CLARITIN) 10 MG tablet Take 10 mg by mouth daily.     Multiple Vitamin (STRESS FORMULA) TABS Take 1 tablet by mouth daily.     Omega-3 Fatty Acids (FISH OIL) 1000 MG CAPS Take 1,000 mg by mouth daily.      ondansetron (ZOFRAN) 8 MG tablet Take 1 tablet (8 mg total) by mouth every 8 (eight) hours as needed for nausea or vomiting. Start on day 3 after cyclophosphamide chemotherapy. 30 tablet 1   pantoprazole (PROTONIX) 40 MG tablet Take 1 tablet (40 mg total) by mouth 2 (two) times daily. 180 tablet 3   predniSONE (DELTASONE) 20 MG tablet Take 3 tablets (60 mg total) by mouth daily with breakfast. Take for 5 days now x 1 course and then for 5 days after each cycle of chemotherapy 15 tablet 6   prochlorperazine (COMPAZINE) 10  MG tablet Take 1 tablet (10 mg total) by mouth every 6 (six) hours as needed for nausea or vomiting. 30 tablet 6   ramipril (ALTACE) 2.5 MG tablet Take 2.5 mg by mouth daily.       simvastatin (ZOCOR) 40 MG tablet Take 40 mg by mouth at bedtime.       traZODone (DESYREL) 50 MG tablet Take 50 mg by mouth at bedtime.      venlafaxine (EFFEXOR) 75 MG tablet Take 75 mg by mouth daily.     vitamin E 400 UNIT capsule Take 400 Units by mouth daily.     zonisamide (ZONEGRAN) 50 MG capsule Take 50-100 mg by mouth See admin instructions. Take 50mg  by mouth in the morning and 100mg  at night.     No current facility-administered medications for this visit.   Facility-Administered Medications Ordered in Other Visits  Medication Dose Route Frequency Provider Last Rate Last Admin   cyclophosphamide (CYTOXAN) 1,000 mg in sodium chloride 0.9 % 250 mL chemo infusion  500 mg/m2 (Treatment Plan Recorded) Intravenous Once Johney Maine, MD       etoposide (VEPESID) 100 mg in sodium chloride 0.9 % 500 mL chemo infusion  50 mg/m2 (Treatment Plan Recorded) Intravenous Once Johney Maine, MD       heparin lock flush 100 unit/mL  500 Units Intracatheter Once PRN Johney Maine, MD       riTUXimab-pvvr (RUXIENCE) 700 mg in sodium chloride 0.9 % 250 mL (2.1875 mg/mL) infusion  375 mg/m2 (Treatment Plan Recorded) Intravenous Once Aguilar, Shawnda Mauney  Alan Ripper, MD       sodium chloride flush (NS) 0.9 % injection 10 mL  10 mL Intracatheter PRN Johney Maine, MD       vinCRIStine (ONCOVIN) 1 mg in sodium chloride 0.9 % 50 mL chemo infusion  1 mg Intravenous Once Johney Maine, MD        REVIEW OF SYSTEMS:   10 Point review of Systems was done is negative except as noted above.  PHYSICAL EXAMINATION: ECOG PERFORMANCE STATUS: 1 - Symptomatic but completely ambulatory GENERAL:alert, in no acute distress and comfortable SKIN: no acute rashes, no significant lesions EYES: conjunctiva are pink and  non-injected, sclera anicteric OROPHARYNX: MMM, no exudates, no oropharyngeal erythema or ulceration NECK: supple, no JVD LYMPH:  b/l cervical lymphadenopathy- biopsy incision well-healed  LUNGS: clear to auscultation b/l with normal respiratory effort HEART: regular rate & rhythm ABDOMEN:  normoactive bowel sounds , non tender, not distended. Extremity: no pedal edema PSYCH: alert & oriented x 3 with fluent speech NEURO: no focal motor/sensory deficits  LABORATORY DATA:  I have reviewed the data as listed    Latest Ref Rng & Units 11/20/2022    9:48 AM 11/15/2022    1:15 PM 10/30/2022    9:41 AM  CBC  WBC 4.0 - 10.5 K/uL 5.0  10.8  5.7   Hemoglobin 12.0 - 15.0 g/dL 40.9  81.1  91.4   Hematocrit 36.0 - 46.0 % 37.7  38.7  41.4   Platelets 150 - 400 K/uL 120  83  135       Latest Ref Rng & Units 11/20/2022    9:48 AM 11/15/2022    1:15 PM 10/30/2022    9:41 AM  CMP  Glucose 70 - 99 mg/dL 782  956  213   BUN 8 - 23 mg/dL 19  14  11    Creatinine 0.44 - 1.00 mg/dL 0.86  5.78  4.69   Sodium 135 - 145 mmol/L 141  139  137   Potassium 3.5 - 5.1 mmol/L 3.9  3.8  3.9   Chloride 98 - 111 mmol/L 108  107  106   CO2 22 - 32 mmol/L 26  27  24    Calcium 8.9 - 10.3 mg/dL 8.9  8.9  9.3   Total Protein 6.5 - 8.1 g/dL 6.4  6.6  6.8   Total Bilirubin 0.3 - 1.2 mg/dL 0.4  0.4  0.9   Alkaline Phos 38 - 126 U/L 200  207  80   AST 15 - 41 U/L 16  15  16    ALT 0 - 44 U/L 12  13  12     Lab Results  Component Value Date   LDH 239 (H) 11/15/2022   Molecular Pathology 10/21/2022:    Surgical Pathology 10/04/22: A. LYMPH NODE, RIGHT LEVEL 2 DEEP CERVICAL, EXCISION:  -Diffuse large B-cell lymphoma  -See comment COMMENT: The sections show diffuse effacement of the lymph nodal architecture primarily by a population of large lymphoid cells characterized by vesicular chromatin and small nucleoli associated with apoptosis and brisk mitosis.  In some areas, the large atypical lymphoid  proliferation extends into the perinodal adipose tissue.  In this background, there are scattered variably sized and somewhat disrupted aggregates of primarily small lymphoid cells characterized by high nuclear cytoplasmic ratio, angulated nuclear contours and small to inconspicuous nucleoli. Flow cytometric analysis was performed [facility] [id number]) and shows a monoclonal, lambda restricted B-cell population expressing CD10.  In addition, immunohistochemical stains for CD3,  CD5, CD10, CD20, PAX5, BCL6, Bcl-2, Ki-67, CD30, CD138, CD21, EBV in addition to in situ hybridization for kappa and lambda were performed with appropriate controls.  The large lymphoid cells are positive for CD20, PAX5, CD10, BCL6, Bcl-2, and partially for cytoplasmic lambda.  No significant staining is seen with EBV, CD30, CD138 or cytoplasmic kappa.  Ki67 shows variably increased expression (more than 50% in some areas). CD21 highlights scattered somewhat disrupted follicular dendritic networks. The lymphoid aggregates of primarily small lymphoid cells previously described show positivity for B-cell markers CD20 and PAX5 in addition to CD10 and Bcl-2.  There is an admixed variable T-cell component in the background as seen with CD3 and CD5 and there is no apparent co-expression of CD5 in B-cell areas.  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   RADIOGRAPHIC STUDIES: I have personally reviewed the radiological images as listed and agreed with the findings in the report. IR IMAGING GUIDED PORT INSERTION  Result Date: 10/27/2022 INDICATION: Lymphoma. In need of durable intravenous access for chemotherapy administration. EXAM: IMPLANTED PORT A CATH PLACEMENT WITH ULTRASOUND AND FLUOROSCOPIC GUIDANCE COMPARISON:  PET-CT-09/04/2022 MEDICATIONS: None ANESTHESIA/SEDATION: Moderate  (conscious) sedation was employed during this procedure as administered by the Interventional Radiology RN. A total of Versed 2 mg and Fentanyl 100 mcg was administered intravenously. Moderate Sedation Time: 24 minutes. The patient's level of consciousness and vital signs were monitored continuously by radiology nursing throughout the procedure under my direct supervision. CONTRAST:  None FLUOROSCOPY TIME:  54 seconds (2 mGy) COMPLICATIONS: None immediate. PROCEDURE: The procedure, risks, benefits, and alternatives were explained to the patient. Questions regarding the procedure were encouraged and answered. The patient understands and consents to the procedure. Sonographic evaluation of the neck demonstrates bulky malignant adenopathy bilaterally. Given the presence of the left anterior chest wall AICD/pacemaker of the, the decision was made to proceed with right internal jugular approach port a catheter placement. As such, the right neck and chest were prepped with chlorhexidine in a sterile fashion, and a sterile drape was applied covering the operative field. Maximum barrier sterile technique with sterile gowns and gloves were used for the procedure. A timeout was performed prior to the initiation of the procedure. Local anesthesia was provided with 1% lidocaine with epinephrine. After creating a small venotomy incision, a micropuncture kit was utilized to access the internal jugular vein. Real-time ultrasound guidance was utilized for vascular access including the acquisition of a permanent ultrasound image documenting patency of the accessed vessel. The microwire was utilized to measure appropriate catheter length. A subcutaneous port pocket was then created along the upper chest wall utilizing a combination of sharp and blunt dissection. The pocket was irrigated with sterile saline. A single lumen Slim sized power injectable port was chosen for placement. The 8 Fr catheter was tunneled from the port pocket  site to the venotomy incision. The port was placed in the pocket. The external catheter was trimmed to appropriate length. At the venotomy, an 8 Fr peel-away sheath was placed over a guidewire under fluoroscopic guidance. The catheter was then placed through the sheath and the sheath was removed. Final catheter positioning was confirmed and documented with a fluoroscopic spot radiograph. The port was accessed with a Huber needle, aspirated and flushed with heparinized saline. The venotomy site was closed with an interrupted 4-0 Vicryl suture. The port pocket incision was closed with interrupted 2-0  Vicryl suture. Dermabond and Steri-strips were applied to both incisions. Dressings were applied. The patient tolerated the procedure well without immediate post procedural complication. FINDINGS: Sonographic evaluation demonstrates bulky bilateral malignant appearing cervical lymphadenopathy, findings compatible with recent PET-CT. After catheter placement, the tip lies within the superior cavoatrial junction. The catheter aspirates and flushes normally and is ready for immediate use. IMPRESSION: Successful placement of a right internal jugular approach power injectable Port-A-Cath. The catheter is ready for immediate use. Electronically Signed   By: Simonne Come M.D.   On: 10/27/2022 17:35   CUP PACEART REMOTE DEVICE CHECK  Result Date: 10/04/2022 Scheduled non-billable remote reviewed. Normal device function.  Battery estimated 79mo Next remote 4/29 [place], CV[facility]   NM PET 09/04/2022: IMPRESSION: 1. There is a large tracer avid mass centered within the left base of tongue and left lingual region which extends into the hypopharyngeal region ventral to the epiglottis. Imaging findings are compatible with a primary head and neck malignancy. 2. A smaller focus of increased uptake localizes to the right pharyngeal tonsil region. Indeterminate. 3. Bilateral tracer avid cervical and supraclavicular lymph  nodes compatible with nodal metastasis. 4. There is a 7 mm tracer avid nodule within the anteromedial left upper lobe which is suspicious for pulmonary metastasis. 5. There is increased uptake identified along the long axis of the posterior right seventh rib. No corresponding lytic or sclerotic changes identified on the CT images. Cannot exclude bone metastases. 6. Multiple prominent retroperitoneal and mesenteric lymph nodes are identified which exhibit mild tracer uptake. This is a nonspecific finding and may reflect reactive adenopathy. Metastatic adenopathy cannot be excluded. Attention on future surveillance imaging is advised. 7.  Aortic Atherosclerosis (ICD10-I70.0).   ASSESSMENT & PLAN:   77 y.o.  female with:   1. 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 2. 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 CBC shows improved platelets from 83 K to 120 K. CMP shows elevated Alkaline Phosphate level at 200.  -patient tolerated cycle 1 of R-CEOP well without any toxicities.  -Patient can proceed with cycle 2 of her treatment as planned without any dose modification.  -chemo-immunotherapy orders reviewed and signed  FOLLOW-UP: Per integrated scheduling for C3 and C4 of treatment  The total time spent in the appointment was 30 minutes* .  All of the patient's questions were answered with apparent satisfaction. The patient knows to call the clinic with any problems, questions or concerns.   Wyvonnia Lora MD MS AAHIVMS [facility] Hematology/Oncology Physician [facility]  .*Total Encounter Time as defined by the Centers for Medicare and Medicaid  Services includes, in addition to the face-to-face time of a patient visit (documented in the note above) non-face-to-face time: obtaining and reviewing outside history, ordering and reviewing medications, tests or procedures, care coordination (communications with other health care professionals or caregivers) and documentation in the medical record.   I, Ok Edwards, am acting as a Neurosurgeon for Wyvonnia Lora, MD. .I have reviewed the above documentation for accuracy and completeness, and I agree with the above. Johney Maine MD

## 2022-11-20 NOTE — Progress Notes (Signed)
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  Past medical history of CHF, anxiety, CAD, depression, HLD, HTN, MI, vit D deficiency.  Patient receiving R-CEOP.    Met with patient during infusion.  Reports that her appetite is good.  Reports that throat was sore but improved.  Denies trouble swallowing foods.  Has been trying to loose weight.  Eats late breakfast of egg with cheese on english muffin or grits, likes tea.  Supper is [place] with cabbage or casserole with ground beef and cheese and potato or salad with crab meat.  Snacks on pudding cups and peanut butter crackers, yogurt.  Denies nausea   Medications:  Zofran, compazine, b complex, protoinx, calcium carbonate/vit D, Vit B 12, omega 3   Labs: glucose 109   Anthropometrics:   Height: 67 inches Weight: 158 lb  172 lb on 08/12/22 BMI: 25  8% weight loss in the last 3 months   Kcals: 1800-2100 Protein:  90-105 g Fluid:  1800-2100 ml   NUTRITION DIAGNOSIS: Inadequate oral intake related to cancer causing sore throat as evidenced by 8% weight loss in 3 months.     INTERVENTION:  Discussed importance of weight maintenance during treatment Encouraged good sources of protein at every meal.  Discussed foods high in protein Contact information provided   MONITORING, EVALUATION, GOAL: weight trends, intake   Next Visit: Monday, June 3rd during infusion  Zyhir Cappella B. Freida Busman, RD, LDN Registered Dietitian [phone]

## 2022-11-20 NOTE — Progress Notes (Signed)
Pt scheduled to See Dr. Candise Che while she is in the infusion room. Per Dr. Candise Che ok to proceed with treatment. He will come to infusion to see her during her infusion

## 2022-11-21 ENCOUNTER — Inpatient Hospital Stay: Payer: Medicare HMO

## 2022-11-21 ENCOUNTER — Encounter: Payer: Self-pay | Admitting: Hematology

## 2022-11-21 VITALS — BP 142/55 | HR 82 | Temp 98.8°F | Resp 16

## 2022-11-21 DIAGNOSIS — C8331 Diffuse large B-cell lymphoma, lymph nodes of head, face, and neck: Secondary | ICD-10-CM | POA: Diagnosis not present

## 2022-11-21 DIAGNOSIS — Z5189 Encounter for other specified aftercare: Secondary | ICD-10-CM | POA: Diagnosis not present

## 2022-11-21 DIAGNOSIS — Z5112 Encounter for antineoplastic immunotherapy: Secondary | ICD-10-CM | POA: Diagnosis not present

## 2022-11-21 DIAGNOSIS — Z79899 Other long term (current) drug therapy: Secondary | ICD-10-CM | POA: Diagnosis not present

## 2022-11-21 DIAGNOSIS — Z7189 Other specified counseling: Secondary | ICD-10-CM

## 2022-11-21 DIAGNOSIS — C8338 Diffuse large B-cell lymphoma, lymph nodes of multiple sites: Secondary | ICD-10-CM

## 2022-11-21 DIAGNOSIS — Z5111 Encounter for antineoplastic chemotherapy: Secondary | ICD-10-CM | POA: Diagnosis not present

## 2022-11-21 MED ORDER — SODIUM CHLORIDE 0.9 % IV SOLN
50.0000 mg/m2 | Freq: Once | INTRAVENOUS | Status: AC
  Administered 2022-11-21: 100 mg via INTRAVENOUS
  Filled 2022-11-21: qty 5

## 2022-11-21 MED ORDER — SODIUM CHLORIDE 0.9 % IV SOLN: Freq: Once | INTRAVENOUS | Status: AC

## 2022-11-21 MED ORDER — HEPARIN SOD (PORK) LOCK FLUSH 100 UNIT/ML IV SOLN
500.0000 [IU] | Freq: Once | INTRAVENOUS | Status: AC | PRN
  Administered 2022-11-21: 500 [IU]

## 2022-11-21 MED ORDER — PROCHLORPERAZINE MALEATE 10 MG PO TABS
10.0000 mg | ORAL_TABLET | Freq: Once | ORAL | Status: AC
  Administered 2022-11-21: 10 mg via ORAL
  Filled 2022-11-21: qty 1

## 2022-11-21 MED ORDER — SODIUM CHLORIDE 0.9% FLUSH
10.0000 mL | INTRAVENOUS | Status: DC | PRN
  Administered 2022-11-21: 10 mL

## 2022-11-21 NOTE — Patient Instructions (Signed)
[facility]  Discharge Instructions: Thank you for choosing [facility] to provide your oncology and hematology care.   If you have a lab appointment with the Cancer Center, please go directly to the Cancer Center and check in at the registration area.   Wear comfortable clothing and clothing appropriate for easy access to any Portacath or PICC line.   We strive to give you quality time with your provider. You may need to reschedule your appointment if you arrive late (15 or more minutes).  Arriving late affects you and other patients whose appointments are after yours.  Also, if you miss three or more appointments without notifying the office, you may be dismissed from the clinic at the provider's discretion.      For prescription refill requests, have your pharmacy contact our office and allow 72 hours for refills to be completed.    Today you received the following chemotherapy and/or immunotherapy agents; Etoposide      To help prevent nausea and vomiting after your treatment, we encourage you to take your nausea medication as directed.  BELOW ARE SYMPTOMS THAT SHOULD BE REPORTED IMMEDIATELY: *FEVER GREATER THAN 100.4 F (38 C) OR HIGHER *CHILLS OR SWEATING *NAUSEA AND VOMITING THAT IS NOT CONTROLLED WITH YOUR NAUSEA MEDICATION *UNUSUAL SHORTNESS OF BREATH *UNUSUAL BRUISING OR BLEEDING *URINARY PROBLEMS (pain or burning when urinating, or frequent urination) *BOWEL PROBLEMS (unusual diarrhea, constipation, pain near the anus) TENDERNESS IN MOUTH AND THROAT WITH OR WITHOUT PRESENCE OF ULCERS (sore throat, sores in mouth, or a toothache) UNUSUAL RASH, SWELLING OR PAIN  UNUSUAL VAGINAL DISCHARGE OR ITCHING   Items with * indicate a potential emergency and should be followed up as soon as possible or go to the Emergency Department if any problems should occur.  Please show the CHEMOTHERAPY ALERT CARD or IMMUNOTHERAPY ALERT CARD at  check-in to the Emergency Department and triage nurse.  Should you have questions after your visit or need to cancel or reschedule your appointment, please contact [facility]  Dept: [phone]  and follow the prompts.  Office hours are 8:00 a.m. to 4:30 p.m. Monday - Friday. Please note that voicemails left after 4:00 p.m. may not be returned until the following business day.  We are closed weekends and major holidays. You have access to a nurse at all times for urgent questions. Please call the main number to the clinic Dept: [phone] and follow the prompts.   For any non-urgent questions, you may also contact your provider using MyChart. We now offer e-Visits for anyone 18 and older to request care online for non-urgent symptoms. For details visit [email or web address].   Also download the MyChart app! Go to the app store, search "MyChart", open the app, select [facility], and log in with your MyChart username and password.   

## 2022-11-22 ENCOUNTER — Inpatient Hospital Stay: Payer: Medicare HMO

## 2022-11-22 VITALS — BP 138/55 | HR 78 | Temp 98.0°F | Resp 18

## 2022-11-22 DIAGNOSIS — Z5112 Encounter for antineoplastic immunotherapy: Secondary | ICD-10-CM | POA: Diagnosis not present

## 2022-11-22 DIAGNOSIS — C8331 Diffuse large B-cell lymphoma, lymph nodes of head, face, and neck: Secondary | ICD-10-CM | POA: Diagnosis not present

## 2022-11-22 DIAGNOSIS — Z5111 Encounter for antineoplastic chemotherapy: Secondary | ICD-10-CM | POA: Diagnosis not present

## 2022-11-22 DIAGNOSIS — Z79899 Other long term (current) drug therapy: Secondary | ICD-10-CM | POA: Diagnosis not present

## 2022-11-22 DIAGNOSIS — Z7189 Other specified counseling: Secondary | ICD-10-CM

## 2022-11-22 DIAGNOSIS — C8338 Diffuse large B-cell lymphoma, lymph nodes of multiple sites: Secondary | ICD-10-CM

## 2022-11-22 DIAGNOSIS — Z5189 Encounter for other specified aftercare: Secondary | ICD-10-CM | POA: Diagnosis not present

## 2022-11-22 MED ORDER — SODIUM CHLORIDE 0.9 % IV SOLN
50.0000 mg/m2 | Freq: Once | INTRAVENOUS | Status: AC
  Administered 2022-11-22: 100 mg via INTRAVENOUS
  Filled 2022-11-22: qty 5

## 2022-11-22 MED ORDER — PROCHLORPERAZINE MALEATE 10 MG PO TABS
10.0000 mg | ORAL_TABLET | Freq: Once | ORAL | Status: AC
  Administered 2022-11-22: 10 mg via ORAL
  Filled 2022-11-22: qty 1

## 2022-11-22 MED ORDER — SODIUM CHLORIDE 0.9 % IV SOLN: Freq: Once | INTRAVENOUS | Status: AC

## 2022-11-24 ENCOUNTER — Inpatient Hospital Stay: Payer: Medicare HMO

## 2022-11-24 ENCOUNTER — Other Ambulatory Visit: Payer: Self-pay

## 2022-11-24 VITALS — BP 138/62 | HR 77 | Resp 18

## 2022-11-24 DIAGNOSIS — Z5189 Encounter for other specified aftercare: Secondary | ICD-10-CM | POA: Diagnosis not present

## 2022-11-24 DIAGNOSIS — M545 Low back pain, unspecified: Secondary | ICD-10-CM | POA: Diagnosis not present

## 2022-11-24 DIAGNOSIS — C8338 Diffuse large B-cell lymphoma, lymph nodes of multiple sites: Secondary | ICD-10-CM

## 2022-11-24 DIAGNOSIS — Z5112 Encounter for antineoplastic immunotherapy: Secondary | ICD-10-CM | POA: Diagnosis not present

## 2022-11-24 DIAGNOSIS — Z79899 Other long term (current) drug therapy: Secondary | ICD-10-CM | POA: Diagnosis not present

## 2022-11-24 DIAGNOSIS — Z5111 Encounter for antineoplastic chemotherapy: Secondary | ICD-10-CM | POA: Diagnosis not present

## 2022-11-24 DIAGNOSIS — Z7189 Other specified counseling: Secondary | ICD-10-CM

## 2022-11-24 DIAGNOSIS — C8331 Diffuse large B-cell lymphoma, lymph nodes of head, face, and neck: Secondary | ICD-10-CM | POA: Diagnosis not present

## 2022-11-24 MED ORDER — PEGFILGRASTIM-CBQV 6 MG/0.6ML ~~LOC~~ SOSY
6.0000 mg | PREFILLED_SYRINGE | Freq: Once | SUBCUTANEOUS | Status: AC
  Administered 2022-11-24: 6 mg via SUBCUTANEOUS
  Filled 2022-11-24: qty 0.6

## 2022-11-24 NOTE — Patient Instructions (Signed)

## 2022-11-26 ENCOUNTER — Encounter: Payer: Self-pay | Admitting: Hematology

## 2022-11-29 DIAGNOSIS — M545 Low back pain, unspecified: Secondary | ICD-10-CM | POA: Diagnosis not present

## 2022-12-01 ENCOUNTER — Other Ambulatory Visit: Payer: Self-pay | Admitting: Physician Assistant

## 2022-12-01 DIAGNOSIS — M5126 Other intervertebral disc displacement, lumbar region: Secondary | ICD-10-CM

## 2022-12-05 ENCOUNTER — Ambulatory Visit (INDEPENDENT_AMBULATORY_CARE_PROVIDER_SITE_OTHER): Payer: Medicare HMO

## 2022-12-05 DIAGNOSIS — I255 Ischemic cardiomyopathy: Secondary | ICD-10-CM

## 2022-12-06 DIAGNOSIS — M545 Low back pain, unspecified: Secondary | ICD-10-CM | POA: Diagnosis not present

## 2022-12-06 LAB — CUP PACEART REMOTE DEVICE CHECK
Battery Remaining Longevity: 3 mo
Battery Voltage: 2.8 V
Brady Statistic AP VP Percent: 0 %
Brady Statistic AP VS Percent: 1.91 %
Brady Statistic AS VP Percent: 0.03 %
Brady Statistic AS VS Percent: 98.06 %
Brady Statistic RA Percent Paced: 1.91 %
Brady Statistic RV Percent Paced: 0.03 %
Date Time Interrogation Session: 20240528090925
HighPow Impedance: 62 Ohm
HighPow Impedance: 82 Ohm
Implantable Lead Connection Status: 753985
Implantable Lead Connection Status: 753985
Implantable Lead Implant Date: 20071003
Implantable Lead Implant Date: 20071019
Implantable Lead Location: 753859
Implantable Lead Location: 753860
Implantable Lead Model: 5076
Implantable Lead Model: 6947
Implantable Pulse Generator Implant Date: 20140729
Lead Channel Impedance Value: 361 Ohm
Lead Channel Impedance Value: 456 Ohm
Lead Channel Impedance Value: 475 Ohm
Lead Channel Pacing Threshold Amplitude: 0.5 V
Lead Channel Pacing Threshold Amplitude: 0.875 V
Lead Channel Pacing Threshold Pulse Width: 0.4 ms
Lead Channel Pacing Threshold Pulse Width: 0.4 ms
Lead Channel Sensing Intrinsic Amplitude: 2.5 mV
Lead Channel Sensing Intrinsic Amplitude: 2.5 mV
Lead Channel Sensing Intrinsic Amplitude: 6.625 mV
Lead Channel Sensing Intrinsic Amplitude: 6.625 mV
Lead Channel Setting Pacing Amplitude: 2 V
Lead Channel Setting Pacing Amplitude: 2.5 V
Lead Channel Setting Pacing Pulse Width: 0.4 ms
Lead Channel Setting Sensing Sensitivity: 0.3 mV
Zone Setting Status: 755011

## 2022-12-08 ENCOUNTER — Ambulatory Visit
Admission: RE | Admit: 2022-12-08 | Discharge: 2022-12-08 | Disposition: A | Discharge status: Home / Self Care | Payer: Medicare HMO | Source: Ambulatory Visit | Attending: Physician Assistant | Admitting: Physician Assistant

## 2022-12-08 DIAGNOSIS — M545 Low back pain, unspecified: Secondary | ICD-10-CM | POA: Diagnosis not present

## 2022-12-08 DIAGNOSIS — M48061 Spinal stenosis, lumbar region without neurogenic claudication: Secondary | ICD-10-CM | POA: Diagnosis not present

## 2022-12-08 DIAGNOSIS — M5136 Other intervertebral disc degeneration, lumbar region: Secondary | ICD-10-CM | POA: Diagnosis not present

## 2022-12-08 DIAGNOSIS — M5126 Other intervertebral disc displacement, lumbar region: Secondary | ICD-10-CM

## 2022-12-08 DIAGNOSIS — I700 Atherosclerosis of aorta: Secondary | ICD-10-CM | POA: Diagnosis not present

## 2022-12-08 MED ORDER — ONDANSETRON HCL 4 MG/2ML IJ SOLN: 4.0000 mg | Freq: Once | INTRAMUSCULAR | Status: DC | PRN

## 2022-12-08 MED ORDER — DIAZEPAM 5 MG PO TABS
5.0000 mg | ORAL_TABLET | Freq: Once | ORAL | Status: AC
  Administered 2022-12-08: 5 mg via ORAL

## 2022-12-08 MED ORDER — MEPERIDINE HCL 50 MG/ML IJ SOLN: 50.0000 mg | Freq: Once | INTRAMUSCULAR | Status: DC | PRN

## 2022-12-08 MED ORDER — IOPAMIDOL (ISOVUE-M 200) INJECTION 41%
18.0000 mL | Freq: Once | INTRAMUSCULAR | Status: AC
  Administered 2022-12-08: 18 mL via INTRATHECAL

## 2022-12-08 MED FILL — Dexamethasone Sodium Phosphate Inj 100 MG/10ML: INTRAMUSCULAR | Qty: 1 | Status: AC

## 2022-12-08 NOTE — Progress Notes (Signed)
Remote ICD transmission.   

## 2022-12-08 NOTE — Addendum Note (Signed)
Addended by: Geralyn Flash D on: 12/08/2022 03:58 PM   Modules accepted: Level of Service

## 2022-12-08 NOTE — Progress Notes (Signed)
Rapid Infusion Rituximab Pharmacist Evaluation  Rachel Bates is a 77 y.o. female being treated with rituximab for Non- Hodgkins lymphoma . This patient may be considered for RIR.   A pharmacist has verified the patient tolerated rituximab infusions per the [facility] standard infusion protocol without grade 3-4 infusion reactions. The treatment plan will be updated to reflect RIR if the patient qualifies per the checklist below:   Age > 35 years old Yes   Clinically significant cardiovascular disease CHF but MD Candise Che feels patient should be ok to be switched to RIR   Circulating lymphocyte count < 5000/uL prior to cycle two Yes  Lab Results  Component Value Date   LYMPHSABS 0.7 11/20/2022    Prior documented grade 3-4 infusion reaction to rituximab No   Prior documented grade 1-2 infusion reaction to rituximab (If YES, Pharmacist will confirm with Physician if patient is still a candidate for RIR) No   Previous rituximab infusion within the past 6 months No   Treatment Plan updated orders to reflect RIR Yes    Rachel Bates does meet the criteria for Rapid Infusion Rituximab. This patient is going to be switched to rapid infusion rituximab.   Rachel Bates D 12/08/22 9:58 AM

## 2022-12-08 NOTE — Discharge Instructions (Signed)

## 2022-12-11 ENCOUNTER — Other Ambulatory Visit: Payer: Self-pay | Admitting: Hematology

## 2022-12-11 ENCOUNTER — Inpatient Hospital Stay: Payer: Medicare HMO | Attending: Hematology | Admitting: Hematology

## 2022-12-11 ENCOUNTER — Inpatient Hospital Stay: Payer: Medicare HMO

## 2022-12-11 VITALS — BP 120/58 | HR 86 | Temp 98.4°F | Resp 17 | Wt 156.5 lb

## 2022-12-11 DIAGNOSIS — C8331 Diffuse large B-cell lymphoma, lymph nodes of head, face, and neck: Secondary | ICD-10-CM | POA: Insufficient documentation

## 2022-12-11 DIAGNOSIS — Z5111 Encounter for antineoplastic chemotherapy: Secondary | ICD-10-CM | POA: Insufficient documentation

## 2022-12-11 DIAGNOSIS — C8338 Diffuse large B-cell lymphoma, lymph nodes of multiple sites: Secondary | ICD-10-CM

## 2022-12-11 DIAGNOSIS — Z7189 Other specified counseling: Secondary | ICD-10-CM

## 2022-12-11 DIAGNOSIS — Z5189 Encounter for other specified aftercare: Secondary | ICD-10-CM | POA: Diagnosis not present

## 2022-12-11 DIAGNOSIS — Z5112 Encounter for antineoplastic immunotherapy: Secondary | ICD-10-CM | POA: Insufficient documentation

## 2022-12-11 DIAGNOSIS — Z95828 Presence of other vascular implants and grafts: Secondary | ICD-10-CM

## 2022-12-11 LAB — CBC WITH DIFFERENTIAL (CANCER CENTER ONLY)
Abs Immature Granulocytes: 0.02 10*3/uL (ref 0.00–0.07)
Basophils Absolute: 0 10*3/uL (ref 0.0–0.1)
Basophils Relative: 0 %
Eosinophils Absolute: 0 10*3/uL (ref 0.0–0.5)
Eosinophils Relative: 0 %
HCT: 36.6 % (ref 36.0–46.0)
Hemoglobin: 12.1 g/dL (ref 12.0–15.0)
Immature Granulocytes: 1 %
Lymphocytes Relative: 16 %
Lymphs Abs: 0.6 10*3/uL — ABNORMAL LOW (ref 0.7–4.0)
MCH: 30.3 pg (ref 26.0–34.0)
MCHC: 33.1 g/dL (ref 30.0–36.0)
MCV: 91.5 fL (ref 80.0–100.0)
Monocytes Absolute: 0.4 10*3/uL (ref 0.1–1.0)
Monocytes Relative: 10 %
Neutro Abs: 2.8 10*3/uL (ref 1.7–7.7)
Neutrophils Relative %: 73 %
Platelet Count: 100 10*3/uL — ABNORMAL LOW (ref 150–400)
RBC: 4 MIL/uL (ref 3.87–5.11)
RDW: 19.7 % — ABNORMAL HIGH (ref 11.5–15.5)
WBC Count: 3.9 10*3/uL — ABNORMAL LOW (ref 4.0–10.5)
nRBC: 0 % (ref 0.0–0.2)

## 2022-12-11 LAB — CMP (CANCER CENTER ONLY)
ALT: 15 U/L (ref 0–44)
AST: 18 U/L (ref 15–41)
Albumin: 4.1 g/dL (ref 3.5–5.0)
Alkaline Phosphatase: 103 U/L (ref 38–126)
Anion gap: 6 (ref 5–15)
BUN: 17 mg/dL (ref 8–23)
CO2: 25 mmol/L (ref 22–32)
Calcium: 9 mg/dL (ref 8.9–10.3)
Chloride: 108 mmol/L (ref 98–111)
Creatinine: 0.68 mg/dL (ref 0.44–1.00)
GFR, Estimated: >60 mL/min (ref >60)
Glucose, Bld: 98 mg/dL (ref 70–99)
Potassium: 3.8 mmol/L (ref 3.5–5.1)
Sodium: 139 mmol/L (ref 135–145)
Total Bilirubin: 0.5 mg/dL (ref 0.3–1.2)
Total Protein: 6.4 g/dL — ABNORMAL LOW (ref 6.5–8.1)

## 2022-12-11 MED ORDER — SODIUM CHLORIDE 0.9 % IV SOLN
50.0000 mg/m2 | Freq: Once | INTRAVENOUS | Status: AC
  Administered 2022-12-11: 100 mg via INTRAVENOUS
  Filled 2022-12-11: qty 5

## 2022-12-11 MED ORDER — SODIUM CHLORIDE 0.9 % IV SOLN
10.0000 mg | Freq: Once | INTRAVENOUS | Status: AC
  Administered 2022-12-11: 10 mg via INTRAVENOUS
  Filled 2022-12-11: qty 10

## 2022-12-11 MED ORDER — SODIUM CHLORIDE 0.9% FLUSH
10.0000 mL | Freq: Once | INTRAVENOUS | Status: AC
  Administered 2022-12-11: 10 mL

## 2022-12-11 MED ORDER — VINCRISTINE SULFATE CHEMO INJECTION 1 MG/ML
1.0000 mg | Freq: Once | INTRAVENOUS | Status: AC
  Administered 2022-12-11: 1 mg via INTRAVENOUS
  Filled 2022-12-11: qty 1

## 2022-12-11 MED ORDER — SODIUM CHLORIDE 0.9 % IV SOLN
500.0000 mg/m2 | Freq: Once | INTRAVENOUS | Status: AC
  Administered 2022-12-11: 1000 mg via INTRAVENOUS
  Filled 2022-12-11: qty 50

## 2022-12-11 MED ORDER — ACETAMINOPHEN 325 MG PO TABS
650.0000 mg | ORAL_TABLET | Freq: Once | ORAL | Status: AC
  Administered 2022-12-11: 650 mg via ORAL
  Filled 2022-12-11: qty 2

## 2022-12-11 MED ORDER — PALONOSETRON HCL INJECTION 0.25 MG/5ML
0.2500 mg | Freq: Once | INTRAVENOUS | Status: AC
  Administered 2022-12-11: 0.25 mg via INTRAVENOUS
  Filled 2022-12-11: qty 5

## 2022-12-11 MED ORDER — SODIUM CHLORIDE 0.9 % IV SOLN: Freq: Once | INTRAVENOUS | Status: AC

## 2022-12-11 MED ORDER — DIPHENHYDRAMINE HCL 25 MG PO CAPS
50.0000 mg | ORAL_CAPSULE | Freq: Once | ORAL | Status: AC
  Administered 2022-12-11: 50 mg via ORAL
  Filled 2022-12-11: qty 2

## 2022-12-11 MED ORDER — FAMOTIDINE IN NACL 20-0.9 MG/50ML-% IV SOLN
20.0000 mg | Freq: Once | INTRAVENOUS | Status: AC
  Administered 2022-12-11: 20 mg via INTRAVENOUS
  Filled 2022-12-11: qty 50

## 2022-12-11 MED ORDER — SODIUM CHLORIDE 0.9 % IV SOLN
375.0000 mg/m2 | Freq: Once | INTRAVENOUS | Status: AC
  Administered 2022-12-11: 700 mg via INTRAVENOUS
  Filled 2022-12-11: qty 50

## 2022-12-11 MED ORDER — SODIUM CHLORIDE 0.9% FLUSH
10.0000 mL | INTRAVENOUS | Status: DC | PRN
  Administered 2022-12-11: 10 mL

## 2022-12-11 MED ORDER — HEPARIN SOD (PORK) LOCK FLUSH 100 UNIT/ML IV SOLN
500.0000 [IU] | Freq: Once | INTRAVENOUS | Status: AC | PRN
  Administered 2022-12-11: 500 [IU]

## 2022-12-11 NOTE — Patient Instructions (Signed)

## 2022-12-11 NOTE — Progress Notes (Signed)
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 female who has been referred to [place] by Carylon Perches, DO for evaluation and management of non hodgkin's lymphoma. She has a hx of chronic systolic heart failure.  Nasolaryngoscopy revealed a mass lesion of the left oropharynx, emanating from the left base of tongue/glossotonsillar sulcus. Patient subsequently had a CT neck with contrast performed on 08/09/2022, which reported extensive bilateral adenopaty, with greatest lymph node conglomerate measuring 3.2 x 2.4 cm in the right level 2. Patient underwent core needle biopsy on 08/18/22 and endorsed persistent throat pain and odynophagia at the time. She is a former smoker and quit in the late 90s. SHe endorsed heavy tobacco use prior to quitting, with 1.5 pack/day for about 30 years.   Today, she is accompanied by her daughter. She complains of a sore throat beginning around Thanksgiving time. She was given antibiotics and prednisone which did not improve symptoms. She endorses swelling in her neck with associated soreness which came quickly past 2-3 weeks. She reports that her neck edema has recently grown on both the left and right sides. Her neck pain is in a specific area. She also complains of recent her neck pain if she turns her neck.  She did present Emergency EMT because something was stuck in her throat. Endoscopy revealed that it was food and it was pushed down. An ulcer was also found in her esophagus and some narrowing which will be stretched in three  months. She has not had any issues with swallowing food since then. She has not had a biopsy of issue on the back of her tongue. No other lumps/bumps, unexplained fevers, chills, night sweat, change in breathing, abdominal pain, or skin rashes. She reports discomfort with drinking tea which caused her to eat less as well.   She is allergic to Sumatriptan and another medication she cannot recall. She does not consume alcohol and has quit smoking in the 90s. 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 She believes the sore was caused by shoe discomfort.  She denies any facial puffiness, abdominal pain, recent change in bowel habits or urination. She reports that her defibrillator has never gone off.   She reports a ganglion cyst on her right knee, 2 knee replacements, and 2 hip replacements. She denies a Fhx of blood disorders. However, her sister has lung cancer and was a frequent smoker.  She is UTD with her vaccinations, incuding influenza, RSV, and COVID-19 booster. She has endorsed migraines since childhood. She lives on her own is able to complete daily activities independently. She will follow-up with Dr. Marene Lenz soon. She follows up with cardiologist regularly. Her a fib, pacemaker, and ICD device have been stable. She has not had a recent echo.   INTERVAL HISTORY:  Rachel Bates is a 77 y.o. female who presents today for follow up and management of diffuse large B-cell lymphoma. She is here for cycle 3 day 1 of her treatment during this visit.   Patient  was last seen by me on 11/20/2022 and she was doing well overall, except persistent bilateral hip pain.   Patient is accompanied by her family member during this visit. She complains of back pain, weight loss with stable appetite, dry eyes, and bilateral hand tendonitis. However, she notes that her back pain has improved significantly with physical  therapy. But, her back pain worsens when she stands for about 15-20 minutes. She has started to walk without any support.   She notes she tolerated cycle 2 of her treatment well overall, except complains of dry eyes and hair loss.   She denies fever, chills, night sweats, abnormal bowel movement, abdominal pain, fatigue, chest pain, or leg swelling.   MEDICAL HISTORY:  Past Medical History:  Diagnosis Date   AICD (automatic cardioverter/defibrillator) present dual   [employer] ---  original placedment 2007/  generator change 2014 by dr gregg taylor   Anemia    Anticoagulant long-term use    eliquis   Anxiety    Arthralgia of multiple joints    Arthritis pain    Benign hypertensive heart disease    CAD (coronary artery disease) primary cardiologist-- dr gregg taylor   MI and 2 stents 1995 in [place]   Cancer [facility])    CHF NYHA class II, chronic, systolic (HCC)    followed by dr Sharlot Gowda taylor   Degenerative disc disease, lumbar    Depression    Dyslipidemia    History of basal cell carcinoma (BCC) excision    right ankle area s/p  excision in office 06/ 2019  in office   History of DVT of lower extremity yrs ago before 2012   History of MI (myocardial infarction) 1995  in [place]   History of pulmonary embolus (PE) 2012   History of ventricular tachycardia    Hyperlipidemia    Hypersomnia    Hypertension    Ischemic cardiomyopathy    Migraines    Myocardial infarction (HCC)    OA (osteoarthritis)    "all over"   Obstructive apnea    PAF (paroxysmal atrial fibrillation) (HCC)    Primary localized osteoarthritis of right hip 12/18/2017   Primary localized osteoarthritis of right knee 05/28/2018   S/P coronary artery stent placement 1995   in [facility]   05-21-2018 per pt x2  stents in same coronary artery (unsure BM or DES)   Vitamin D deficiency disease     SURGICAL HISTORY: Past Surgical History:  Procedure Laterality Date   BIOPSY  06/19/2022   Procedure: BIOPSY;  Surgeon:  Kathi Der, MD;  Location: [facility];  Service: Gastroenterology;;   CARDIAC DEFIBRILLATOR PLACEMENT  2007   CATARACT EXTRACTION, BILATERAL     COLONOSCOPY  04/14/2013   colonic polyp, status post polypectomy. Mild panocolonic diverticulosis. Small internal hemorrhoids   DILATION AND CURETTAGE OF UTERUS  yrs ago   DIRECT LARYNGOSCOPY Right 10/04/2022   Procedure: DIRECT LARYNGOSCOPY;  Surgeon: Cheron Schaumann A, DO;  Location: [facility] OR;  Service: ENT;  Laterality: Right;   ESOPHAGOGASTRODUODENOSCOPY (EGD) WITH PROPOFOL N/A 06/19/2022   Procedure: ESOPHAGOGASTRODUODENOSCOPY (EGD) WITH PROPOFOL;  Surgeon: Kathi Der, MD;  Location: [facility];  Service: Gastroenterology;  Laterality: N/A;   IMPACTION REMOVAL  06/19/2022   Procedure: IMPACTION REMOVAL;  Surgeon: Kathi Der, MD;  Location: [facility];  Service: Gastroenterology;;   IMPLANTABLE CARDIOVERTER DEFIBRILLATOR GENERATOR CHANGE N/A 02/04/2013   Procedure: IMPLANTABLE CARDIOVERTER DEFIBRILLATOR GENERATOR CHANGE;  Surgeon: Marinus Maw, MD;  Location: [facility] CATH LAB;  Service: Cardiovascular;  Laterality: N/A;   IR IMAGING GUIDED PORT INSERTION  10/27/2022   LYMPH NODE BIOPSY Right 10/04/2022   Procedure: EXCISIONAL OF RIGHT DEEP CERVICAL LYMPH NODE;  Surgeon: Laren Boom, DO;  Location: [facility] OR;  Service: ENT;  Laterality: Right;   SHOULDER ARTHROSCOPY Right 2015   TOTAL HIP ARTHROPLASTY Right 12/18/2017   Procedure: RIGHT TOTAL HIP ARTHROPLASTY;  Surgeon: Teryl Lucy, MD;  Location: [facility] OR;  Service: Orthopedics;  Laterality: Right;   TOTAL HIP ARTHROPLASTY Left 04/19/2021   Procedure: TOTAL HIP ARTHROPLASTY;  Surgeon: Teryl Lucy, MD;  Location: [facility];  Service: Orthopedics;  Laterality: Left;   TOTAL KNEE ARTHROPLASTY Right 05/28/2018   Procedure: TOTAL KNEE ARTHROPLASTY;  Surgeon: Teryl Lucy, MD;  Location: [facility];  Service: Orthopedics;  Laterality: Right;  Adductor Block   TOTAL KNEE ARTHROPLASTY  Left 08/12/2021   Procedure: TOTAL KNEE ARTHROPLASTY;  Surgeon: Teryl Lucy, MD;  Location: [facility];  Service: Orthopedics;  Laterality: Left;   TUBAL LIGATION Bilateral yrs ago   WISDOM TOOTH EXTRACTION      SOCIAL HISTORY: Social History   Socioeconomic History   Marital status: Single    Spouse name: Not on file   Number of children: Not on file   Years of education: Not on file   Highest education level: Not on file  Occupational History   Not on file  Tobacco Use   Smoking status: Former    Years: 30    Types: Cigarettes    Quit date: 05/21/1996    Years since quitting: 26.5   Smokeless tobacco: Never  Vaping Use   Vaping Use: Never used  Substance and Sexual Activity   Alcohol use: No   Drug use: Never   Sexual activity: Not on file  Other Topics Concern   Not on file  Social History Narrative   Not on file   Social Determinants of Health   Financial Resource Strain: Low Risk  (05/28/2018)   Overall Financial Resource Strain (CARDIA)    Difficulty of Paying Living Expenses: Not hard at all  Food Insecurity: No Food Insecurity (05/28/2018)   Hunger Vital Sign    Worried About Running Out of Food in the Last Year: Never true    Ran Out of Food in the Last Year: Never true  Transportation Needs: No Transportation Needs (05/28/2018)   PRAPARE - Administrator, Civil Service (Medical): No    Lack of Transportation (Non-Medical): No  Physical Activity: Not on file  Stress: Not on file  Social Connections: Not on file  Intimate Partner Violence: Not on file    FAMILY HISTORY: Family History  Problem Relation Age of Onset   Hypertension Mother    Thyroid disease Mother    Alzheimer's disease Mother    Coronary artery disease Father    Pulmonary embolism Father    Congestive Heart Failure Maternal Grandmother    Hypertension Maternal Grandmother    Heart attack Maternal Grandfather    Other Maternal Grandfather        carotid disease    Dementia Paternal Grandmother    Other Paternal Grandfather 22       accident    ALLERGIES:  is allergic to sumatriptan succinate and amitriptyline.  MEDICATIONS:  Current Outpatient Medications  Medication Sig Dispense Refill   acetaminophen (TYLENOL) 650 MG CR tablet Take 1,300 mg by mouth 3 (three) times daily.     acyclovir (ZOVIRAX) 400 MG tablet Take 1 tablet (400 mg  total) by mouth 2 (two) times daily. 30 tablet 5   allopurinol (ZYLOPRIM) 300 MG tablet Take 0.5 tablets (150 mg total) by mouth daily. 30 tablet 0   apixaban (ELIQUIS) 5 MG TABS tablet TAKE 1 TABLET BY MOUTH TWICE A DAY 60 tablet 5   b complex vitamins capsule Take 1 capsule by mouth daily.     Calcium Carb-Cholecalciferol (CALCIUM 600 + D PO) Take 1 tablet by mouth daily.     carvedilol (COREG) 12.5 MG tablet Take 12.5 mg by mouth 2 (two) times daily with a meal.      Cholecalciferol (VITAMIN D3) 125 MCG (5000 UT) capsule Take 5,000 Units by mouth daily.     cyanocobalamin (VITAMIN B12) 1000 MCG tablet Take 1,000 mcg by mouth daily.     FIBER PO Take 1 capsule by mouth 2 (two) times daily.     Fiber POWD Take 1 Scoop by mouth daily as needed (constipation).     furosemide (LASIX) 40 MG tablet Take 40 mg by mouth daily as needed for edema.     gabapentin (NEURONTIN) 300 MG capsule Take 300 mg by mouth 3 (three) times daily.     HYDROcodone-acetaminophen (NORCO/VICODIN) 5-325 MG tablet Take 1 tablet by mouth every 6 (six) hours as needed for moderate pain.     lidocaine-prilocaine (EMLA) cream Apply to affected area once 30 g 3   loratadine (CLARITIN) 10 MG tablet Take 10 mg by mouth daily.     Multiple Vitamin (STRESS FORMULA) TABS Take 1 tablet by mouth daily.     Omega-3 Fatty Acids (FISH OIL) 1000 MG CAPS Take 1,000 mg by mouth daily.      ondansetron (ZOFRAN) 8 MG tablet Take 1 tablet (8 mg total) by mouth every 8 (eight) hours as needed for nausea or vomiting. Start on day 3 after cyclophosphamide chemotherapy. 30  tablet 1   pantoprazole (PROTONIX) 40 MG tablet Take 1 tablet (40 mg total) by mouth 2 (two) times daily. 180 tablet 3   predniSONE (DELTASONE) 20 MG tablet Take 3 tablets (60 mg total) by mouth daily with breakfast. Take for 5 days now x 1 course and then for 5 days after each cycle of chemotherapy 15 tablet 6   prochlorperazine (COMPAZINE) 10 MG tablet Take 1 tablet (10 mg total) by mouth every 6 (six) hours as needed for nausea or vomiting. 30 tablet 6   ramipril (ALTACE) 2.5 MG tablet Take 2.5 mg by mouth daily.       simvastatin (ZOCOR) 40 MG tablet Take 40 mg by mouth at bedtime.       traMADol (ULTRAM) 50 MG tablet Take 1 tablet (50 mg total) by mouth every 12 (twelve) hours as needed. 30 tablet 0   traZODone (DESYREL) 50 MG tablet Take 50 mg by mouth at bedtime.      venlafaxine (EFFEXOR) 75 MG tablet Take 75 mg by mouth daily.     vitamin E 400 UNIT capsule Take 400 Units by mouth daily.     zonisamide (ZONEGRAN) 50 MG capsule Take 50-100 mg by mouth See admin instructions. Take 50mg  by mouth in the morning and 100mg  at night.     No current facility-administered medications for this visit.    REVIEW OF SYSTEMS:   10 Point review of Systems was done is negative except as noted above.  PHYSICAL EXAMINATION: ECOG PERFORMANCE STATUS: 1 - Symptomatic but completely ambulatory  GENERAL:alert, in no acute distress and comfortable SKIN: no acute rashes,  no significant lesions EYES: conjunctiva are pink and non-injected, sclera anicteric OROPHARYNX: MMM, no exudates, no oropharyngeal erythema or ulceration NECK: supple, no JVD LYMPH:  b/l cervical lymphadenopathy- biopsy incision well-healed  LUNGS: clear to auscultation b/l with normal respiratory effort HEART: regular rate & rhythm ABDOMEN:  normoactive bowel sounds , non tender, not distended. Extremity: no pedal edema PSYCH: alert & oriented x 3 with fluent speech NEURO: no focal motor/sensory deficits  LABORATORY DATA:  I  have reviewed the data as listed    Latest Ref Rng & Units 12/11/2022    9:02 AM 11/20/2022    9:48 AM 11/15/2022    1:15 PM  CBC  WBC 4.0 - 10.5 K/uL 3.9  5.0  10.8   Hemoglobin 12.0 - 15.0 g/dL 16.1  09.6  04.5   Hematocrit 36.0 - 46.0 % 36.6  37.7  38.7   Platelets 150 - 400 K/uL 100  120  83       Latest Ref Rng & Units 12/11/2022    9:02 AM 11/20/2022    9:48 AM 11/15/2022    1:15 PM  CMP  Glucose 70 - 99 mg/dL 98  409  811   BUN 8 - 23 mg/dL 17  19  14    Creatinine 0.44 - 1.00 mg/dL 9.14  7.82  9.56   Sodium 135 - 145 mmol/L 139  141  139   Potassium 3.5 - 5.1 mmol/L 3.8  3.9  3.8   Chloride 98 - 111 mmol/L 108  108  107   CO2 22 - 32 mmol/L 25  26  27    Calcium 8.9 - 10.3 mg/dL 9.0  8.9  8.9   Total Protein 6.5 - 8.1 g/dL 6.4  6.4  6.6   Total Bilirubin 0.3 - 1.2 mg/dL 0.5  0.4  0.4   Alkaline Phos 38 - 126 U/L 103  200  207   AST 15 - 41 U/L 18  16  15    ALT 0 - 44 U/L 15  12  13     Lab Results  Component Value Date   LDH 239 (H) 11/15/2022   Molecular Pathology 10/21/2022:    Surgical Pathology 10/04/22: A. LYMPH NODE, RIGHT LEVEL 2 DEEP CERVICAL, EXCISION:  -Diffuse large B-cell lymphoma  -See comment COMMENT: The sections show diffuse effacement of the lymph nodal architecture primarily by a population of large lymphoid cells characterized by vesicular chromatin and small nucleoli associated with apoptosis and brisk mitosis.  In some areas, the large atypical lymphoid proliferation extends into the perinodal adipose tissue.  In this background, there are scattered variably sized and somewhat disrupted aggregates of primarily small lymphoid cells characterized by high nuclear cytoplasmic ratio, angulated nuclear contours and small to inconspicuous nucleoli. Flow cytometric analysis was performed [facility] [id number]) and shows a monoclonal, lambda restricted B-cell population expressing CD10.  In addition, immunohistochemical stains for CD3, CD5, CD10, CD20, PAX5, BCL6,  Bcl-2, Ki-67, CD30, CD138, CD21, EBV in addition to in situ hybridization for kappa and lambda were performed with appropriate controls.  The large lymphoid cells are positive for CD20, PAX5, CD10, BCL6, Bcl-2, and partially for cytoplasmic lambda.  No significant staining is seen with EBV, CD30, CD138 or cytoplasmic kappa.  Ki67 shows variably increased expression (more than 50% in some areas). CD21 highlights scattered somewhat disrupted follicular dendritic networks. The lymphoid aggregates of primarily small lymphoid cells previously described show positivity for B-cell markers CD20 and PAX5 in addition to CD10 and Bcl-2.  There is an admixed variable T-cell component in the background as seen with CD3 and CD5 and there is no apparent co-expression of CD5 in B-cell areas.  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   RADIOGRAPHIC STUDIES: I have personally reviewed the radiological images as listed and agreed with the findings in the report. DG MYELOGRAPHY LUMBAR INJ LUMBOSACRAL  Result Date: 12/08/2022 CLINICAL DATA:  Displacement of lumbar intervertebral disc without myelopathy. Low back pain and right groin pain. EXAM: LUMBAR MYELOGRAM FLUOROSCOPY: Radiation Exposure Index (as provided by the fluoroscopic device): 18.80 mGy Kerma PROCEDURE: After thorough discussion of risks and benefits of the procedure including bleeding, infection, injury to nerves, blood vessels, adjacent structures as well as headache and CSF leak, written and oral informed consent was obtained. Consent was obtained by Dr. Sebastian Ache. Time out form was completed. Patient was positioned prone on the fluoroscopy table. Local anesthesia was provided with 1% lidocaine without epinephrine after prepped and draped in the usual sterile fashion. Puncture was performed at L5-S1 using a 3  1/2 inch 22-gauge spinal needle via a left interlaminar approach. Using a single pass through the dura, the needle was placed within the thecal sac, with return of clear CSF. 15 mL of Isovue M-200 was injected into the thecal sac, with normal opacification of the nerve roots and cauda equina consistent with free flow within the subarachnoid space. I personally performed the lumbar puncture and administered the intrathecal contrast. I also personally supervised acquisition of the myelogram images. TECHNIQUE: Contiguous axial images were obtained through the Lumbar spine after the intrathecal infusion of contrast. Coronal and sagittal reconstructions were obtained of the axial image sets. COMPARISON:  Lumbar spine radiographs 03/14/2017 FINDINGS: LUMBAR MYELOGRAM FINDINGS: There are 5 non rib-bearing lumbar type vertebrae. Grade 1 retrolisthesis is noted L3 on L4 and L4 on L5. There is no evidence of dynamic instability on flexion or extension radiographs, although range of motion appears very limited. At L4-5, a ventral extradural defect contributes to moderate spinal stenosis, and there is evidence of bilateral lateral recess stenosis with effacement of the L5 nerve roots. There is milder spinal stenosis at L3-4. CT LUMBAR MYELOGRAM FINDINGS: There is mild-to-moderate lumbar dextroscoliosis, and there is grade 1 retrolisthesis of L3 on L4 and L4 on L5. No fracture or suspicious osseous lesion is identified. Interbody ankylosis is noted at L2-3 and to a lesser extent L3-4, and there is likely partial facet ankylosis at these levels as well. The conus medullaris terminates at L1-2. There is abdominal aortic atherosclerosis without aneurysm. T11-12: Mild disc bulging, small partially calcified left paracentral disc protrusion, and severe facet arthrosis without significant stenosis. T12-L1: Minimal disc bulging, a shallow right subarticular disc protrusion, and moderate to severe facet arthrosis without significant  stenosis. L1-2: Disc bulging, a left central to left foraminal disc osteophyte complex, and moderate facet arthrosis result in mild left lateral recess stenosis without significant spinal or neural foraminal stenosis. L2-3: Interbody fusion. Endplate spurring and moderate to severe facet hypertrophy result in mild left lateral recess stenosis without significant spinal or neural foraminal stenosis. L3-4: Interbody fusion. Disc bulging, endplate spurring, and moderate to severe facet and ligamentum flavum hypertrophy result in mild spinal stenosis, mild right greater than left lateral recess stenosis, and mild left neural foraminal stenosis. L4-5: Right eccentric disc bulging, endplate spurring, asymmetrically severe right-sided disc space height loss, and  severe facet and ligamentum flavum hypertrophy result in moderate spinal stenosis, moderate bilateral lateral recess stenosis, and moderate to severe right and mild left neural foraminal stenosis. Potential right L4 and bilateral L5 nerve root impingement. L5-S1: Disc bulging, asymmetric left-sided endplate spurring, and severe facet hypertrophy result in moderate left neural foraminal stenosis with potential left L5 nerve root impingement. No spinal stenosis. IMPRESSION: 1. Advanced lumbar disc and facet degeneration. 2. Moderate spinal stenosis and moderate to severe right neural foraminal stenosis at L4-5. 3. Moderate left neural foraminal stenosis at L5-S1. 4. Mild spinal stenosis at L3-4. 5.  Aortic Atherosclerosis (ICD10-I70.0). Electronically Signed   By: Sebastian Ache M.D.   On: 12/08/2022 16:38   CT LUMBAR SPINE W CONTRAST  Result Date: 12/08/2022 CLINICAL DATA:  Displacement of lumbar intervertebral disc without myelopathy. Low back pain and right groin pain. EXAM: LUMBAR MYELOGRAM FLUOROSCOPY: Radiation Exposure Index (as provided by the fluoroscopic device): 18.80 mGy Kerma PROCEDURE: After thorough discussion of risks and benefits of the procedure  including bleeding, infection, injury to nerves, blood vessels, adjacent structures as well as headache and CSF leak, written and oral informed consent was obtained. Consent was obtained by Dr. Sebastian Ache. Time out form was completed. Patient was positioned prone on the fluoroscopy table. Local anesthesia was provided with 1% lidocaine without epinephrine after prepped and draped in the usual sterile fashion. Puncture was performed at L5-S1 using a 3 1/2 inch 22-gauge spinal needle via a left interlaminar approach. Using a single pass through the dura, the needle was placed within the thecal sac, with return of clear CSF. 15 mL of Isovue M-200 was injected into the thecal sac, with normal opacification of the nerve roots and cauda equina consistent with free flow within the subarachnoid space. I personally performed the lumbar puncture and administered the intrathecal contrast. I also personally supervised acquisition of the myelogram images. TECHNIQUE: Contiguous axial images were obtained through the Lumbar spine after the intrathecal infusion of contrast. Coronal and sagittal reconstructions were obtained of the axial image sets. COMPARISON:  Lumbar spine radiographs 03/14/2017 FINDINGS: LUMBAR MYELOGRAM FINDINGS: There are 5 non rib-bearing lumbar type vertebrae. Grade 1 retrolisthesis is noted L3 on L4 and L4 on L5. There is no evidence of dynamic instability on flexion or extension radiographs, although range of motion appears very limited. At L4-5, a ventral extradural defect contributes to moderate spinal stenosis, and there is evidence of bilateral lateral recess stenosis with effacement of the L5 nerve roots. There is milder spinal stenosis at L3-4. CT LUMBAR MYELOGRAM FINDINGS: There is mild-to-moderate lumbar dextroscoliosis, and there is grade 1 retrolisthesis of L3 on L4 and L4 on L5. No fracture or suspicious osseous lesion is identified. Interbody ankylosis is noted at L2-3 and to a lesser extent  L3-4, and there is likely partial facet ankylosis at these levels as well. The conus medullaris terminates at L1-2. There is abdominal aortic atherosclerosis without aneurysm. T11-12: Mild disc bulging, small partially calcified left paracentral disc protrusion, and severe facet arthrosis without significant stenosis. T12-L1: Minimal disc bulging, a shallow right subarticular disc protrusion, and moderate to severe facet arthrosis without significant stenosis. L1-2: Disc bulging, a left central to left foraminal disc osteophyte complex, and moderate facet arthrosis result in mild left lateral recess stenosis without significant spinal or neural foraminal stenosis. L2-3: Interbody fusion. Endplate spurring and moderate to severe facet hypertrophy result in mild left lateral recess stenosis without significant spinal or neural foraminal stenosis. L3-4: Interbody fusion. Disc bulging,  endplate spurring, and moderate to severe facet and ligamentum flavum hypertrophy result in mild spinal stenosis, mild right greater than left lateral recess stenosis, and mild left neural foraminal stenosis. L4-5: Right eccentric disc bulging, endplate spurring, asymmetrically severe right-sided disc space height loss, and severe facet and ligamentum flavum hypertrophy result in moderate spinal stenosis, moderate bilateral lateral recess stenosis, and moderate to severe right and mild left neural foraminal stenosis. Potential right L4 and bilateral L5 nerve root impingement. L5-S1: Disc bulging, asymmetric left-sided endplate spurring, and severe facet hypertrophy result in moderate left neural foraminal stenosis with potential left L5 nerve root impingement. No spinal stenosis. IMPRESSION: 1. Advanced lumbar disc and facet degeneration. 2. Moderate spinal stenosis and moderate to severe right neural foraminal stenosis at L4-5. 3. Moderate left neural foraminal stenosis at L5-S1. 4. Mild spinal stenosis at L3-4. 5.  Aortic  Atherosclerosis (ICD10-I70.0). Electronically Signed   By: Sebastian Ache M.D.   On: 12/08/2022 16:38   CUP PACEART REMOTE DEVICE CHECK  Result Date: 12/06/2022 Scheduled remote reviewed. Normal device function.  There was one NSVT arrhythmia detected that was 1:1 conduction and therefore supraventricular in origin. The device estimates 3 months until [facility] Next remote 01/04/2023. Hassell Halim, RN, CCDS, [employer]   NM PET 09/04/2022: IMPRESSION: 1. There is a large tracer avid mass centered within the left base of tongue and left lingual region which extends into the hypopharyngeal region ventral to the epiglottis. Imaging findings are compatible with a primary head and neck malignancy. 2. A smaller focus of increased uptake localizes to the right pharyngeal tonsil region. Indeterminate. 3. Bilateral tracer avid cervical and supraclavicular lymph nodes compatible with nodal metastasis. 4. There is a 7 mm tracer avid nodule within the anteromedial left upper lobe which is suspicious for pulmonary metastasis. 5. There is increased uptake identified along the long axis of the posterior right seventh rib. No corresponding lytic or sclerotic changes identified on the CT images. Cannot exclude bone metastases. 6. Multiple prominent retroperitoneal and mesenteric lymph nodes are identified which exhibit mild tracer uptake. This is a nonspecific finding and may reflect reactive adenopathy. Metastatic adenopathy cannot be excluded. Attention on future surveillance imaging is advised. 7.  Aortic Atherosclerosis (ICD10-I70.0).   ASSESSMENT & PLAN:   77 y.o.  female with:   1. 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 2. 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 CBC shows decreased WBC at 3.9 K and decreased platelets at 100 K. CMP is stable.  -Discussed the most recent CT Lumbar results in detail with the patient.  -Discussed the toxicities of her treatment. -Patient tolerated cycle 2 of her R-CEOP well without any toxicities.  -Patient can proceed with cycle 3 of her treatment as planned without any dose modification.  -recommended to exercise, stay hydrated, and eat healthy. -use otc eye drops for dry eyes. -pet scan before next cycle.  -chemo-immunotherapy orders reviewed and signed   FOLLOW-UP: Please schedule cycle for cycle 5 and cycle 6 of R-CEOP chemotherapy as per integrated scheduling with port flush labs and MD visits with each cycle. PET CT scan in 2 weeks   The total time spent in the appointment was 32 minutes* .  All  of the patient's questions were answered with apparent satisfaction. The patient knows to call the clinic with any problems, questions or concerns.   Wyvonnia Lora MD MS AAHIVMS [facility] Hematology/Oncology Physician [facility]  .*Total Encounter Time as defined by the Centers for Medicare and Medicaid Services includes, in addition to the face-to-face time of a patient visit (documented in the note above) non-face-to-face time: obtaining and reviewing outside history, ordering and reviewing medications, tests or procedures, care coordination (communications with other health care professionals or caregivers) and documentation in the medical record.   I, Ok Edwards, am acting as a Neurosurgeon for Wyvonnia Lora, MD.  .I have reviewed the above documentation for accuracy and completeness, and I agree with the above. Johney Maine MD

## 2022-12-11 NOTE — Patient Instructions (Signed)
[place] [facility]  Discharge Instructions: Thank you for choosing [facility] to provide your oncology and hematology care.   If you have a lab appointment with the Cancer Center, please go directly to the Cancer Center and check in at the registration area.   Wear comfortable clothing and clothing appropriate for easy access to any Portacath or PICC line.   We strive to give you quality time with your provider. You may need to reschedule your appointment if you arrive late (15 or more minutes).  Arriving late affects you and other patients whose appointments are after yours.  Also, if you miss three or more appointments without notifying the office, you may be dismissed from the clinic at the provider's discretion.      For prescription refill requests, have your pharmacy contact our office and allow 72 hours for refills to be completed.    Today you received the following chemotherapy and/or immunotherapy agents : Vincristine, Cytoxan, Etoposide, Rituximab      To help prevent nausea and vomiting after your treatment, we encourage you to take your nausea medication as directed.  BELOW ARE SYMPTOMS THAT SHOULD BE REPORTED IMMEDIATELY: *FEVER GREATER THAN 100.4 F (38 C) OR HIGHER *CHILLS OR SWEATING *NAUSEA AND VOMITING THAT IS NOT CONTROLLED WITH YOUR NAUSEA MEDICATION *UNUSUAL SHORTNESS OF BREATH *UNUSUAL BRUISING OR BLEEDING *URINARY PROBLEMS (pain or burning when urinating, or frequent urination) *BOWEL PROBLEMS (unusual diarrhea, constipation, pain near the anus) TENDERNESS IN MOUTH AND THROAT WITH OR WITHOUT PRESENCE OF ULCERS (sore throat, sores in mouth, or a toothache) UNUSUAL RASH, SWELLING OR PAIN  UNUSUAL VAGINAL DISCHARGE OR ITCHING   Items with * indicate a potential emergency and should be followed up as soon as possible or go to the Emergency Department if any problems should occur.  Please show the CHEMOTHERAPY ALERT CARD or  IMMUNOTHERAPY ALERT CARD at check-in to the Emergency Department and triage nurse.  Should you have questions after your visit or need to cancel or reschedule your appointment, please contact [place] [facility]  Dept: [phone]  and follow the prompts.  Office hours are 8:00 a.m. to 4:30 p.m. Monday - Friday. Please note that voicemails left after 4:00 p.m. may not be returned until the following business day.  We are closed weekends and major holidays. You have access to a nurse at all times for urgent questions. Please call the main number to the clinic Dept: [phone] and follow the prompts.   For any non-urgent questions, you may also contact your provider using MyChart. We now offer e-Visits for anyone 29 and older to request care online for non-urgent symptoms. For details visit [email or web address].   Also download the MyChart app! Go to the app store, search "MyChart", open the app, select [place], and log in with your MyChart username and password.

## 2022-12-11 NOTE — Progress Notes (Signed)
Nutrition Follow-up:  Patient with large B cell lymphoma followed by Dr Candise Che.  Patient receiving R-CEOP.    Met with patient during infusion. Reports that her appetite is good.  "I have not been sick."  "Nothing is wrong with my appetite."  Typically has brunch of egg with cheese on english muffin or grits with cheese or hard boiled egg.  Evening meal has been spaghetti.  Likes to snack on fruit, peanut butter crackers.  Has not tried oral nutrition supplements.      Medications: reviewed  Labs: reviewed  Anthropometrics:   Weight 156 lb today  158 lb on 5/13 172 lb on 08/12/22  NUTRITION DIAGNOSIS: Inadequate oral intake stable    INTERVENTION:  Encouraged trying oral nutrition supplement for added calories and protein. Encouraged good sources of protein at every meal.    MONITORING, EVALUATION, GOAL: weight trends, intake   NEXT VISIT: Monday, July 15 during infusion  Alexio Sroka B. Freida Busman, RD, LDN Registered Dietitian [phone]

## 2022-12-12 ENCOUNTER — Inpatient Hospital Stay: Payer: Medicare HMO

## 2022-12-12 ENCOUNTER — Other Ambulatory Visit: Payer: Self-pay

## 2022-12-12 VITALS — BP 117/61 | HR 68 | Temp 98.0°F | Resp 18

## 2022-12-12 DIAGNOSIS — C8338 Diffuse large B-cell lymphoma, lymph nodes of multiple sites: Secondary | ICD-10-CM

## 2022-12-12 DIAGNOSIS — C8331 Diffuse large B-cell lymphoma, lymph nodes of head, face, and neck: Secondary | ICD-10-CM | POA: Diagnosis not present

## 2022-12-12 DIAGNOSIS — Z5189 Encounter for other specified aftercare: Secondary | ICD-10-CM | POA: Diagnosis not present

## 2022-12-12 DIAGNOSIS — Z5112 Encounter for antineoplastic immunotherapy: Secondary | ICD-10-CM | POA: Diagnosis not present

## 2022-12-12 DIAGNOSIS — Z7189 Other specified counseling: Secondary | ICD-10-CM

## 2022-12-12 DIAGNOSIS — Z5111 Encounter for antineoplastic chemotherapy: Secondary | ICD-10-CM | POA: Diagnosis not present

## 2022-12-12 MED ORDER — SODIUM CHLORIDE 0.9 % IV SOLN: Freq: Once | INTRAVENOUS | Status: AC

## 2022-12-12 MED ORDER — SODIUM CHLORIDE 0.9% FLUSH
10.0000 mL | INTRAVENOUS | Status: DC | PRN
  Administered 2022-12-12: 10 mL

## 2022-12-12 MED ORDER — PROCHLORPERAZINE MALEATE 10 MG PO TABS
10.0000 mg | ORAL_TABLET | Freq: Once | ORAL | Status: AC
  Administered 2022-12-12: 10 mg via ORAL
  Filled 2022-12-12: qty 1

## 2022-12-12 MED ORDER — HEPARIN SOD (PORK) LOCK FLUSH 100 UNIT/ML IV SOLN
500.0000 [IU] | Freq: Once | INTRAVENOUS | Status: AC | PRN
  Administered 2022-12-12: 500 [IU]

## 2022-12-12 MED ORDER — SODIUM CHLORIDE 0.9 % IV SOLN
50.0000 mg/m2 | Freq: Once | INTRAVENOUS | Status: AC
  Administered 2022-12-12: 100 mg via INTRAVENOUS
  Filled 2022-12-12: qty 5

## 2022-12-12 NOTE — Patient Instructions (Signed)
[place] [facility]  Discharge Instructions: Thank you for choosing [facility] to provide your oncology and hematology care.   If you have a lab appointment with the Cancer Center, please go directly to the Cancer Center and check in at the registration area.   Wear comfortable clothing and clothing appropriate for easy access to any Portacath or PICC line.   We strive to give you quality time with your provider. You may need to reschedule your appointment if you arrive late (15 or more minutes).  Arriving late affects you and other patients whose appointments are after yours.  Also, if you miss three or more appointments without notifying the office, you may be dismissed from the clinic at the provider's discretion.      For prescription refill requests, have your pharmacy contact our office and allow 72 hours for refills to be completed.    Today you received the following chemotherapy and/or immunotherapy agent: Etoposide   To help prevent nausea and vomiting after your treatment, we encourage you to take your nausea medication as directed.  BELOW ARE SYMPTOMS THAT SHOULD BE REPORTED IMMEDIATELY: *FEVER GREATER THAN 100.4 F (38 C) OR HIGHER *CHILLS OR SWEATING *NAUSEA AND VOMITING THAT IS NOT CONTROLLED WITH YOUR NAUSEA MEDICATION *UNUSUAL SHORTNESS OF BREATH *UNUSUAL BRUISING OR BLEEDING *URINARY PROBLEMS (pain or burning when urinating, or frequent urination) *BOWEL PROBLEMS (unusual diarrhea, constipation, pain near the anus) TENDERNESS IN MOUTH AND THROAT WITH OR WITHOUT PRESENCE OF ULCERS (sore throat, sores in mouth, or a toothache) UNUSUAL RASH, SWELLING OR PAIN  UNUSUAL VAGINAL DISCHARGE OR ITCHING   Items with * indicate a potential emergency and should be followed up as soon as possible or go to the Emergency Department if any problems should occur.  Please show the CHEMOTHERAPY ALERT CARD or IMMUNOTHERAPY ALERT CARD at check-in  to the Emergency Department and triage nurse.  Should you have questions after your visit or need to cancel or reschedule your appointment, please contact [place] [facility]  Dept: [phone]  and follow the prompts.  Office hours are 8:00 a.m. to 4:30 p.m. Monday - Friday. Please note that voicemails left after 4:00 p.m. may not be returned until the following business day.  We are closed weekends and major holidays. You have access to a nurse at all times for urgent questions. Please call the main number to the clinic Dept: [phone] and follow the prompts.   For any non-urgent questions, you may also contact your provider using MyChart. We now offer e-Visits for anyone 24 and older to request care online for non-urgent symptoms. For details visit [email or web address].   Also download the MyChart app! Go to the app store, search "MyChart", open the app, select [place], and log in with your MyChart username and password.  Etoposide Injection What is this medication? ETOPOSIDE (e toe POE side) treats some types of cancer. It works by slowing down the growth of cancer cells. This medicine may be used for other purposes; ask your health care provider or pharmacist if you have questions. COMMON BRAND NAME(S): Etopophos, Toposar, VePesid What should I tell my care team before I take this medication? They need to know if you have any of these conditions: Infection Kidney disease Liver disease Low blood counts, such as low white cell, platelet, red cell counts An unusual or allergic reaction to etoposide, other medications, foods, dyes, or preservatives If you or your partner  are pregnant or trying to get pregnant Breastfeeding How should I use this medication? This medication is injected into a vein. It is given by your care team in a hospital or clinic setting. Talk to your care team about the use of this medication in children. Special care may be  needed. Overdosage: If you think you have taken too much of this medicine contact a poison control center or emergency room at once. NOTE: This medicine is only for you. Do not share this medicine with others. What if I miss a dose? Keep appointments for follow-up doses. It is important not to miss your dose. Call your care team if you are unable to keep an appointment. What may interact with this medication? Warfarin This list may not describe all possible interactions. Give your health care provider a list of all the medicines, herbs, non-prescription drugs, or dietary supplements you use. Also tell them if you smoke, drink alcohol, or use illegal drugs. Some items may interact with your medicine. What should I watch for while using this medication? Your condition will be monitored carefully while you are receiving this medication. This medication may make you feel generally unwell. This is not uncommon as chemotherapy can affect healthy cells as well as cancer cells. Report any side effects. Continue your course of treatment even though you feel ill unless your care team tells you to stop. This medication can cause serious side effects. To reduce the risk, your care team may give you other medications to take before receiving this one. Be sure to follow the directions from your care team. This medication may increase your risk of getting an infection. Call your care team for advice if you get a fever, chills, sore throat, or other symptoms of a cold or flu. Do not treat yourself. Try to avoid being around people who are sick. This medication may increase your risk to bruise or bleed. Call your care team if you notice any unusual bleeding. Talk to your care team about your risk of cancer. You may be more at risk for certain types of cancers if you take this medication. Talk to your care team if you may be pregnant. Serious birth defects can occur if you take this medication during pregnancy and for  6 months after the last dose. You will need a negative pregnancy test before starting this medication. Contraception is recommended while taking this medication and for 6 months after the last dose. Your care team can help you find the option that works for you. If your partner can get pregnant, use a condom during sex while taking this medication and for 4 months after the last dose. Do not breastfeed while taking this medication. This medication may cause infertility. Talk to your care team if you are concerned about your fertility. What side effects may I notice from receiving this medication? Side effects that you should report to your care team as soon as possible: Allergic reactions--skin rash, itching, hives, swelling of the face, lips, tongue, or throat Infection--fever, chills, cough, sore throat, wounds that don't heal, pain or trouble when passing urine, general feeling of discomfort or being unwell Low red blood cell level--unusual weakness or fatigue, dizziness, headache, trouble breathing Unusual bruising or bleeding Side effects that usually do not require medical attention (report to your care team if they continue or are bothersome): Diarrhea Fatigue Hair loss Loss of appetite Nausea Vomiting This list may not describe all possible side effects. Call your doctor for medical  advice about side effects. You may report side effects to FDA at 1-800-FDA-1088. Where should I keep my medication? This medication is given in a hospital or clinic. It will not be stored at home. NOTE: This sheet is a summary. It may not cover all possible information. If you have questions about this medicine, talk to your doctor, pharmacist, or health care provider.  2024 Elsevier/Gold Standard (2021-11-17 00:00:00)

## 2022-12-13 ENCOUNTER — Inpatient Hospital Stay: Payer: Medicare HMO

## 2022-12-13 ENCOUNTER — Other Ambulatory Visit: Payer: Self-pay

## 2022-12-13 VITALS — BP 124/60 | HR 79 | Temp 98.4°F | Resp 18

## 2022-12-13 DIAGNOSIS — Z5189 Encounter for other specified aftercare: Secondary | ICD-10-CM | POA: Diagnosis not present

## 2022-12-13 DIAGNOSIS — C8338 Diffuse large B-cell lymphoma, lymph nodes of multiple sites: Secondary | ICD-10-CM

## 2022-12-13 DIAGNOSIS — Z5112 Encounter for antineoplastic immunotherapy: Secondary | ICD-10-CM | POA: Diagnosis not present

## 2022-12-13 DIAGNOSIS — C8331 Diffuse large B-cell lymphoma, lymph nodes of head, face, and neck: Secondary | ICD-10-CM | POA: Diagnosis not present

## 2022-12-13 DIAGNOSIS — Z5111 Encounter for antineoplastic chemotherapy: Secondary | ICD-10-CM | POA: Diagnosis not present

## 2022-12-13 DIAGNOSIS — Z7189 Other specified counseling: Secondary | ICD-10-CM

## 2022-12-13 MED ORDER — PROCHLORPERAZINE MALEATE 10 MG PO TABS
10.0000 mg | ORAL_TABLET | Freq: Once | ORAL | Status: AC
  Administered 2022-12-13: 10 mg via ORAL
  Filled 2022-12-13: qty 1

## 2022-12-13 MED ORDER — SODIUM CHLORIDE 0.9% FLUSH
10.0000 mL | INTRAVENOUS | Status: DC | PRN
  Administered 2022-12-13: 10 mL

## 2022-12-13 MED ORDER — SODIUM CHLORIDE 0.9 % IV SOLN: Freq: Once | INTRAVENOUS | Status: AC

## 2022-12-13 MED ORDER — HEPARIN SOD (PORK) LOCK FLUSH 100 UNIT/ML IV SOLN
500.0000 [IU] | Freq: Once | INTRAVENOUS | Status: AC | PRN
  Administered 2022-12-13: 500 [IU]

## 2022-12-13 MED ORDER — SODIUM CHLORIDE 0.9 % IV SOLN
50.0000 mg/m2 | Freq: Once | INTRAVENOUS | Status: AC
  Administered 2022-12-13: 100 mg via INTRAVENOUS
  Filled 2022-12-13: qty 5

## 2022-12-13 NOTE — Patient Instructions (Signed)
[place] [facility]  Discharge Instructions: Thank you for choosing [facility] to provide your oncology and hematology care.   If you have a lab appointment with the Cancer Center, please go directly to the Cancer Center and check in at the registration area.   Wear comfortable clothing and clothing appropriate for easy access to any Portacath or PICC line.   We strive to give you quality time with your provider. You may need to reschedule your appointment if you arrive late (15 or more minutes).  Arriving late affects you and other patients whose appointments are after yours.  Also, if you miss three or more appointments without notifying the office, you may be dismissed from the clinic at the provider's discretion.      For prescription refill requests, have your pharmacy contact our office and allow 72 hours for refills to be completed.    Today you received the following chemotherapy and/or immunotherapy agents; Etoposide      To help prevent nausea and vomiting after your treatment, we encourage you to take your nausea medication as directed.  BELOW ARE SYMPTOMS THAT SHOULD BE REPORTED IMMEDIATELY: *FEVER GREATER THAN 100.4 F (38 C) OR HIGHER *CHILLS OR SWEATING *NAUSEA AND VOMITING THAT IS NOT CONTROLLED WITH YOUR NAUSEA MEDICATION *UNUSUAL SHORTNESS OF BREATH *UNUSUAL BRUISING OR BLEEDING *URINARY PROBLEMS (pain or burning when urinating, or frequent urination) *BOWEL PROBLEMS (unusual diarrhea, constipation, pain near the anus) TENDERNESS IN MOUTH AND THROAT WITH OR WITHOUT PRESENCE OF ULCERS (sore throat, sores in mouth, or a toothache) UNUSUAL RASH, SWELLING OR PAIN  UNUSUAL VAGINAL DISCHARGE OR ITCHING   Items with * indicate a potential emergency and should be followed up as soon as possible or go to the Emergency Department if any problems should occur.  Please show the CHEMOTHERAPY ALERT CARD or IMMUNOTHERAPY ALERT CARD at  check-in to the Emergency Department and triage nurse.  Should you have questions after your visit or need to cancel or reschedule your appointment, please contact [place] [facility]  Dept: [phone]  and follow the prompts.  Office hours are 8:00 a.m. to 4:30 p.m. Monday - Friday. Please note that voicemails left after 4:00 p.m. may not be returned until the following business day.  We are closed weekends and major holidays. You have access to a nurse at all times for urgent questions. Please call the main number to the clinic Dept: [phone] and follow the prompts.   For any non-urgent questions, you may also contact your provider using MyChart. We now offer e-Visits for anyone 18 and older to request care online for non-urgent symptoms. For details visit [email or web address].   Also download the MyChart app! Go to the app store, search "MyChart", open the app, select [place], and log in with your MyChart username and password.   

## 2022-12-14 DIAGNOSIS — M545 Low back pain, unspecified: Secondary | ICD-10-CM | POA: Diagnosis not present

## 2022-12-15 ENCOUNTER — Inpatient Hospital Stay: Payer: Medicare HMO

## 2022-12-15 VITALS — BP 81/65 | HR 86 | Temp 98.4°F | Resp 18

## 2022-12-15 DIAGNOSIS — C8331 Diffuse large B-cell lymphoma, lymph nodes of head, face, and neck: Secondary | ICD-10-CM | POA: Diagnosis not present

## 2022-12-15 DIAGNOSIS — C8338 Diffuse large B-cell lymphoma, lymph nodes of multiple sites: Secondary | ICD-10-CM

## 2022-12-15 DIAGNOSIS — Z5189 Encounter for other specified aftercare: Secondary | ICD-10-CM | POA: Diagnosis not present

## 2022-12-15 DIAGNOSIS — Z7189 Other specified counseling: Secondary | ICD-10-CM

## 2022-12-15 DIAGNOSIS — Z5111 Encounter for antineoplastic chemotherapy: Secondary | ICD-10-CM | POA: Diagnosis not present

## 2022-12-15 DIAGNOSIS — Z5112 Encounter for antineoplastic immunotherapy: Secondary | ICD-10-CM | POA: Diagnosis not present

## 2022-12-15 MED ORDER — PEGFILGRASTIM-CBQV 6 MG/0.6ML ~~LOC~~ SOSY
6.0000 mg | PREFILLED_SYRINGE | Freq: Once | SUBCUTANEOUS | Status: AC
  Administered 2022-12-15: 6 mg via SUBCUTANEOUS
  Filled 2022-12-15: qty 0.6

## 2022-12-15 NOTE — Patient Instructions (Signed)

## 2022-12-18 ENCOUNTER — Encounter: Payer: Self-pay | Admitting: Hematology

## 2022-12-25 ENCOUNTER — Encounter (HOSPITAL_COMMUNITY)
Admission: RE | Admit: 2022-12-25 | Discharge: 2022-12-25 | Disposition: A | Discharge status: Home / Self Care | Payer: Medicare HMO | Source: Ambulatory Visit | Attending: Hematology | Admitting: Hematology

## 2022-12-25 DIAGNOSIS — C8338 Diffuse large B-cell lymphoma, lymph nodes of multiple sites: Secondary | ICD-10-CM | POA: Diagnosis not present

## 2022-12-25 DIAGNOSIS — R911 Solitary pulmonary nodule: Secondary | ICD-10-CM | POA: Diagnosis not present

## 2022-12-25 DIAGNOSIS — C8330 Diffuse large B-cell lymphoma, unspecified site: Secondary | ICD-10-CM | POA: Diagnosis not present

## 2022-12-25 LAB — GLUCOSE, CAPILLARY: Glucose-Capillary: 91 mg/dL (ref 70–99)

## 2022-12-25 MED ORDER — FLUDEOXYGLUCOSE F - 18 (FDG) INJECTION
7.8000 | Freq: Once | INTRAVENOUS | Status: AC
  Administered 2022-12-25: 7.78 via INTRAVENOUS

## 2022-12-29 NOTE — Progress Notes (Signed)
Remote ICD transmission.   

## 2023-01-01 MED FILL — Dexamethasone Sodium Phosphate Inj 100 MG/10ML: INTRAMUSCULAR | Qty: 1 | Status: AC

## 2023-01-02 ENCOUNTER — Inpatient Hospital Stay: Payer: Medicare HMO

## 2023-01-02 ENCOUNTER — Other Ambulatory Visit: Payer: Self-pay

## 2023-01-02 ENCOUNTER — Inpatient Hospital Stay (HOSPITAL_BASED_OUTPATIENT_CLINIC_OR_DEPARTMENT_OTHER): Payer: Medicare HMO | Admitting: Hematology

## 2023-01-02 VITALS — BP 126/61 | HR 80 | Temp 97.6°F | Resp 18

## 2023-01-02 VITALS — BP 112/58 | HR 91 | Temp 97.5°F | Wt 154.7 lb

## 2023-01-02 DIAGNOSIS — Z7189 Other specified counseling: Secondary | ICD-10-CM | POA: Diagnosis not present

## 2023-01-02 DIAGNOSIS — C8338 Diffuse large B-cell lymphoma, lymph nodes of multiple sites: Secondary | ICD-10-CM

## 2023-01-02 DIAGNOSIS — Z5111 Encounter for antineoplastic chemotherapy: Secondary | ICD-10-CM | POA: Diagnosis not present

## 2023-01-02 DIAGNOSIS — Z5112 Encounter for antineoplastic immunotherapy: Secondary | ICD-10-CM | POA: Diagnosis not present

## 2023-01-02 DIAGNOSIS — C8331 Diffuse large B-cell lymphoma, lymph nodes of head, face, and neck: Secondary | ICD-10-CM | POA: Diagnosis not present

## 2023-01-02 DIAGNOSIS — Z95828 Presence of other vascular implants and grafts: Secondary | ICD-10-CM

## 2023-01-02 DIAGNOSIS — Z5189 Encounter for other specified aftercare: Secondary | ICD-10-CM | POA: Diagnosis not present

## 2023-01-02 LAB — CMP (CANCER CENTER ONLY)
ALT: 16 U/L (ref 0–44)
AST: 20 U/L (ref 15–41)
Albumin: 4.1 g/dL (ref 3.5–5.0)
Alkaline Phosphatase: 88 U/L (ref 38–126)
Anion gap: 6 (ref 5–15)
BUN: 14 mg/dL (ref 8–23)
CO2: 27 mmol/L (ref 22–32)
Calcium: 9.4 mg/dL (ref 8.9–10.3)
Chloride: 107 mmol/L (ref 98–111)
Creatinine: 0.76 mg/dL (ref 0.44–1.00)
GFR, Estimated: >60 mL/min (ref >60)
Glucose, Bld: 97 mg/dL (ref 70–99)
Potassium: 4.2 mmol/L (ref 3.5–5.1)
Sodium: 140 mmol/L (ref 135–145)
Total Bilirubin: 0.6 mg/dL (ref 0.3–1.2)
Total Protein: 6.7 g/dL (ref 6.5–8.1)

## 2023-01-02 LAB — CBC WITH DIFFERENTIAL (CANCER CENTER ONLY)
Abs Immature Granulocytes: 0.01 10*3/uL (ref 0.00–0.07)
Basophils Absolute: 0 10*3/uL (ref 0.0–0.1)
Basophils Relative: 1 %
Eosinophils Absolute: 0 10*3/uL (ref 0.0–0.5)
Eosinophils Relative: 0 %
HCT: 35 % — ABNORMAL LOW (ref 36.0–46.0)
Hemoglobin: 11.6 g/dL — ABNORMAL LOW (ref 12.0–15.0)
Immature Granulocytes: 0 %
Lymphocytes Relative: 15 %
Lymphs Abs: 0.6 10*3/uL — ABNORMAL LOW (ref 0.7–4.0)
MCH: 31.3 pg (ref 26.0–34.0)
MCHC: 33.1 g/dL (ref 30.0–36.0)
MCV: 94.3 fL (ref 80.0–100.0)
Monocytes Absolute: 0.4 10*3/uL (ref 0.1–1.0)
Monocytes Relative: 9 %
Neutro Abs: 2.8 10*3/uL (ref 1.7–7.7)
Neutrophils Relative %: 75 %
Platelet Count: 102 10*3/uL — ABNORMAL LOW (ref 150–400)
RBC: 3.71 MIL/uL — ABNORMAL LOW (ref 3.87–5.11)
RDW: 18.3 % — ABNORMAL HIGH (ref 11.5–15.5)
WBC Count: 3.8 10*3/uL — ABNORMAL LOW (ref 4.0–10.5)
nRBC: 0 % (ref 0.0–0.2)

## 2023-01-02 MED ORDER — SODIUM CHLORIDE 0.9 % IV SOLN: Freq: Once | INTRAVENOUS | Status: AC

## 2023-01-02 MED ORDER — SODIUM CHLORIDE 0.9 % IV SOLN
500.0000 mg/m2 | Freq: Once | INTRAVENOUS | Status: AC
  Administered 2023-01-02: 1000 mg via INTRAVENOUS
  Filled 2023-01-02: qty 50

## 2023-01-02 MED ORDER — HEPARIN SOD (PORK) LOCK FLUSH 100 UNIT/ML IV SOLN
500.0000 [IU] | Freq: Once | INTRAVENOUS | Status: AC | PRN
  Administered 2023-01-02: 500 [IU]

## 2023-01-02 MED ORDER — ACETAMINOPHEN 325 MG PO TABS
650.0000 mg | ORAL_TABLET | Freq: Once | ORAL | Status: AC
  Administered 2023-01-02: 650 mg via ORAL
  Filled 2023-01-02: qty 2

## 2023-01-02 MED ORDER — SODIUM CHLORIDE 0.9 % IV SOLN
50.0000 mg/m2 | Freq: Once | INTRAVENOUS | Status: AC
  Administered 2023-01-02: 100 mg via INTRAVENOUS
  Filled 2023-01-02: qty 5

## 2023-01-02 MED ORDER — SODIUM CHLORIDE 0.9% FLUSH
10.0000 mL | Freq: Once | INTRAVENOUS | Status: AC
  Administered 2023-01-02: 10 mL

## 2023-01-02 MED ORDER — PALONOSETRON HCL INJECTION 0.25 MG/5ML
0.2500 mg | Freq: Once | INTRAVENOUS | Status: AC
  Administered 2023-01-02: 0.25 mg via INTRAVENOUS
  Filled 2023-01-02: qty 5

## 2023-01-02 MED ORDER — SODIUM CHLORIDE 0.9 % IV SOLN
375.0000 mg/m2 | Freq: Once | INTRAVENOUS | Status: AC
  Administered 2023-01-02: 700 mg via INTRAVENOUS
  Filled 2023-01-02: qty 50

## 2023-01-02 MED ORDER — DIPHENHYDRAMINE HCL 25 MG PO CAPS
50.0000 mg | ORAL_CAPSULE | Freq: Once | ORAL | Status: AC
  Administered 2023-01-02: 50 mg via ORAL
  Filled 2023-01-02: qty 2

## 2023-01-02 MED ORDER — SODIUM CHLORIDE 0.9% FLUSH
10.0000 mL | INTRAVENOUS | Status: DC | PRN
  Administered 2023-01-02: 10 mL

## 2023-01-02 MED ORDER — FAMOTIDINE IN NACL 20-0.9 MG/50ML-% IV SOLN
20.0000 mg | Freq: Once | INTRAVENOUS | Status: AC
  Administered 2023-01-02: 20 mg via INTRAVENOUS
  Filled 2023-01-02: qty 50

## 2023-01-02 MED ORDER — VINCRISTINE SULFATE CHEMO INJECTION 1 MG/ML
1.0000 mg | Freq: Once | INTRAVENOUS | Status: AC
  Administered 2023-01-02: 1 mg via INTRAVENOUS
  Filled 2023-01-02: qty 1

## 2023-01-02 MED ORDER — SODIUM CHLORIDE 0.9 % IV SOLN
10.0000 mg | Freq: Once | INTRAVENOUS | Status: AC
  Administered 2023-01-02: 10 mg via INTRAVENOUS
  Filled 2023-01-02: qty 10

## 2023-01-02 NOTE — Progress Notes (Signed)
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 female who has been referred to [place] by Carylon Perches, DO for evaluation and management of non hodgkin's lymphoma. She has a hx of chronic systolic heart failure.  Nasolaryngoscopy revealed a mass lesion of the left oropharynx, emanating from the left base of tongue/glossotonsillar sulcus. Patient subsequently had a CT neck with contrast performed on 08/09/2022, which reported extensive bilateral adenopaty, with greatest lymph node conglomerate measuring 3.2 x 2.4 cm in the right level 2. Patient underwent core needle biopsy on 08/18/22 and endorsed persistent throat pain and odynophagia at the time. She is a former smoker and quit in the late 90s. SHe endorsed heavy tobacco use prior to quitting, with 1.5 pack/day for about 30 years.   Today, she is accompanied by her daughter. She complains of a sore throat beginning around Thanksgiving time. She was given antibiotics and prednisone which did not improve symptoms. She endorses swelling in her neck with associated soreness which came quickly past 2-3 weeks. She reports that her neck edema has recently grown on both the left and right sides. Her neck pain is in a specific area. She also complains of recent her neck pain if she turns her neck.  She did present Emergency EMT because something was stuck in her throat. Endoscopy revealed that it was food and it was pushed down. An ulcer was also found in her esophagus and some narrowing which will be stretched in three  months. She has not had any issues with swallowing food since then. She has not had a biopsy of issue on the back of her tongue. No other lumps/bumps, unexplained fevers, chills, night sweat, change in breathing, abdominal pain, or skin rashes. She reports discomfort with drinking tea which caused her to eat less as well.   She is allergic to Sumatriptan and another medication she cannot recall. She does not consume alcohol and has quit smoking in the 90s. 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 She believes the sore was caused by shoe discomfort.  She denies any facial puffiness, abdominal pain, recent change in bowel habits or urination. She reports that her defibrillator has never gone off.   She reports a ganglion cyst on her right knee, 2 knee replacements, and 2 hip replacements. She denies a Fhx of blood disorders. However, her sister has lung cancer and was a frequent smoker.  She is UTD with her vaccinations, incuding influenza, RSV, and COVID-19 booster. She has endorsed migraines since childhood. She lives on her own is able to complete daily activities independently. She will follow-up with Dr. Marene Lenz soon. She follows up with cardiologist regularly. Her a fib, pacemaker, and ICD device have been stable. She has not had a recent echo.   INTERVAL HISTORY:  Rachel Bates is a 77 y.o. female who presents today for follow up and management of diffuse large B-cell lymphoma. She is here for cycle 4 day 1 of her treatment during this visit.   Patient was  last seen by me on 12/11/2022 and complained of back pain, weight loss, dry eyes, bilateral hand tendonitis, and hair loss. She is here for toxicity check prior to day 1, cycle 4 of R-CEOP treatment.   Today, she is accompanied by her friend. She reports that she has tolerated treatment well with no toxicity issues.   Patient does endorse  manageable pain from her previous right inferior pubic ramus and interval right sacral insufficiency fractures.   She reports normal p.o. intake. She denies any nausea, fever, infection issues, recent falls, pain along the spine, or abdominal pain.  MEDICAL HISTORY:  Past Medical History:  Diagnosis Date   AICD (automatic cardioverter/defibrillator) present dual   [employer] ---  original placedment 2007/  generator change 2014 by dr gregg taylor   Anemia    Anticoagulant long-term use    eliquis   Anxiety    Arthralgia of multiple joints    Arthritis pain    Benign hypertensive heart disease    CAD (coronary artery disease) primary cardiologist-- dr gregg taylor   MI and 2 stents 1995 in [place]   Cancer [facility])    CHF NYHA class II, chronic, systolic (HCC)    followed by dr Sharlot Gowda taylor   Degenerative disc disease, lumbar    Depression    Dyslipidemia    History of basal cell carcinoma (BCC) excision    right ankle area s/p  excision in office 06/ 2019  in office   History of DVT of lower extremity yrs ago before 2012   History of MI (myocardial infarction) 1995  in [place]   History of pulmonary embolus (PE) 2012   History of ventricular tachycardia    Hyperlipidemia    Hypersomnia    Hypertension    Ischemic cardiomyopathy    Migraines    Myocardial infarction (HCC)    OA (osteoarthritis)    "all over"   Obstructive apnea    PAF (paroxysmal atrial fibrillation) (HCC)    Primary localized osteoarthritis of right hip 12/18/2017   Primary localized osteoarthritis of right knee 05/28/2018   S/P coronary artery stent placement 1995   in [facility]   05-21-2018 per pt x2  stents in same coronary artery (unsure BM or DES)   Vitamin D deficiency disease     SURGICAL HISTORY: Past Surgical History:  Procedure Laterality Date   BIOPSY  06/19/2022   Procedure: BIOPSY;  Surgeon: Kathi Der, MD;  Location: [facility];  Service: Gastroenterology;;   CARDIAC DEFIBRILLATOR  PLACEMENT  2007   CATARACT EXTRACTION, BILATERAL     COLONOSCOPY  04/14/2013   colonic polyp, status post polypectomy. Mild panocolonic diverticulosis. Small internal hemorrhoids   DILATION AND CURETTAGE OF UTERUS  yrs ago   DIRECT LARYNGOSCOPY Right 10/04/2022   Procedure: DIRECT LARYNGOSCOPY;  Surgeon: Cheron Schaumann A, DO;  Location: [facility] OR;  Service: ENT;  Laterality: Right;   ESOPHAGOGASTRODUODENOSCOPY (EGD) WITH PROPOFOL N/A 06/19/2022   Procedure: ESOPHAGOGASTRODUODENOSCOPY (EGD) WITH PROPOFOL;  Surgeon: Kathi Der, MD;  Location: [facility];  Service: Gastroenterology;  Laterality: N/A;   IMPACTION REMOVAL  06/19/2022   Procedure: IMPACTION REMOVAL;  Surgeon: Kathi Der, MD;  Location: [facility];  Service: Gastroenterology;;   IMPLANTABLE CARDIOVERTER DEFIBRILLATOR GENERATOR CHANGE N/A 02/04/2013   Procedure: IMPLANTABLE CARDIOVERTER DEFIBRILLATOR GENERATOR CHANGE;  Surgeon: Marinus Maw, MD;  Location[facility] C[facility] LAB;  Service: Cardiovascular;  Laterality: N/A;   IR IMAGING GUIDED PORT INSERTION  10/27/2022   LYMPH NODE BIOPSY Right 10/04/2022  Procedure: EXCISIONAL OF RIGHT DEEP CERVICAL LYMPH NODE;  Surgeon: Laren Boom, DO;  Location: [facility] OR;  Service: ENT;  Laterality: Right;   SHOULDER ARTHROSCOPY Right 2015   TOTAL HIP ARTHROPLASTY Right 12/18/2017   Procedure: RIGHT TOTAL HIP ARTHROPLASTY;  Surgeon: Teryl Lucy, MD;  Location: [facility] OR;  Service: Orthopedics;  Laterality: Right;   TOTAL HIP ARTHROPLASTY Left 04/19/2021   Procedure: TOTAL HIP ARTHROPLASTY;  Surgeon: Teryl Lucy, MD;  Location: [facility];  Service: Orthopedics;  Laterality: Left;   TOTAL KNEE ARTHROPLASTY Right 05/28/2018   Procedure: TOTAL KNEE ARTHROPLASTY;  Surgeon: Teryl Lucy, MD;  Location: [facility];  Service: Orthopedics;  Laterality: Right;  Adductor Block   TOTAL KNEE ARTHROPLASTY Left 08/12/2021   Procedure: TOTAL KNEE ARTHROPLASTY;  Surgeon: Teryl Lucy, MD;  Location: [facility];   Service: Orthopedics;  Laterality: Left;   TUBAL LIGATION Bilateral yrs ago   WISDOM TOOTH EXTRACTION      SOCIAL HISTORY: Social History   Socioeconomic History   Marital status: Single    Spouse name: Not on file   Number of children: Not on file   Years of education: Not on file   Highest education level: Not on file  Occupational History   Not on file  Tobacco Use   Smoking status: Former    Years: 30    Types: Cigarettes    Quit date: 05/21/1996    Years since quitting: 26.6   Smokeless tobacco: Never  Vaping Use   Vaping Use: Never used  Substance and Sexual Activity   Alcohol use: No   Drug use: Never   Sexual activity: Not on file  Other Topics Concern   Not on file  Social History Narrative   Not on file   Social Determinants of Health   Financial Resource Strain: Low Risk  (05/28/2018)   Overall Financial Resource Strain (CARDIA)    Difficulty of Paying Living Expenses: Not hard at all  Food Insecurity: No Food Insecurity (05/28/2018)   Hunger Vital Sign    Worried About Running Out of Food in the Last Year: Never true    Ran Out of Food in the Last Year: Never true  Transportation Needs: No Transportation Needs (05/28/2018)   PRAPARE - Administrator, Civil Service (Medical): No    Lack of Transportation (Non-Medical): No  Physical Activity: Not on file  Stress: Not on file  Social Connections: Not on file  Intimate Partner Violence: Not on file    FAMILY HISTORY: Family History  Problem Relation Age of Onset   Hypertension Mother    Thyroid disease Mother    Alzheimer's disease Mother    Coronary artery disease Father    Pulmonary embolism Father    Congestive Heart Failure Maternal Grandmother    Hypertension Maternal Grandmother    Heart attack Maternal Grandfather    Other Maternal Grandfather        carotid disease   Dementia Paternal Grandmother    Other Paternal Grandfather 61       accident    ALLERGIES:  is  allergic to sumatriptan succinate and amitriptyline.  MEDICATIONS:  Current Outpatient Medications  Medication Sig Dispense Refill   acetaminophen (TYLENOL) 650 MG CR tablet Take 1,300 mg by mouth 3 (three) times daily.     acyclovir (ZOVIRAX) 400 MG tablet Take 1 tablet (400 mg total) by mouth 2 (two) times daily. 30 tablet 5   allopurinol (ZYLOPRIM) 300 MG tablet TAKE 0.5 TABLETS  BY MOUTH DAILY. 45 tablet 1   apixaban (ELIQUIS) 5 MG TABS tablet TAKE 1 TABLET BY MOUTH TWICE A DAY 60 tablet 5   b complex vitamins capsule Take 1 capsule by mouth daily.     Calcium Carb-Cholecalciferol (CALCIUM 600 + D PO) Take 1 tablet by mouth daily.     carvedilol (COREG) 12.5 MG tablet Take 12.5 mg by mouth 2 (two) times daily with a meal.      Cholecalciferol (VITAMIN D3) 125 MCG (5000 UT) capsule Take 5,000 Units by mouth daily.     cyanocobalamin (VITAMIN B12) 1000 MCG tablet Take 1,000 mcg by mouth daily.     FIBER PO Take 1 capsule by mouth 2 (two) times daily.     Fiber POWD Take 1 Scoop by mouth daily as needed (constipation).     furosemide (LASIX) 40 MG tablet Take 40 mg by mouth daily as needed for edema.     gabapentin (NEURONTIN) 300 MG capsule Take 300 mg by mouth 3 (three) times daily.     HYDROcodone-acetaminophen (NORCO/VICODIN) 5-325 MG tablet Take 1 tablet by mouth every 6 (six) hours as needed for moderate pain.     lidocaine-prilocaine (EMLA) cream Apply to affected area once 30 g 3   loratadine (CLARITIN) 10 MG tablet Take 10 mg by mouth daily.     Multiple Vitamin (STRESS FORMULA) TABS Take 1 tablet by mouth daily.     Omega-3 Fatty Acids (FISH OIL) 1000 MG CAPS Take 1,000 mg by mouth daily.      ondansetron (ZOFRAN) 8 MG tablet Take 1 tablet (8 mg total) by mouth every 8 (eight) hours as needed for nausea or vomiting. Start on day 3 after cyclophosphamide chemotherapy. 30 tablet 1   pantoprazole (PROTONIX) 40 MG tablet Take 1 tablet (40 mg total) by mouth 2 (two) times daily. 180  tablet 3   predniSONE (DELTASONE) 20 MG tablet Take 3 tablets (60 mg total) by mouth daily with breakfast. Take for 5 days now x 1 course and then for 5 days after each cycle of chemotherapy 15 tablet 6   prochlorperazine (COMPAZINE) 10 MG tablet Take 1 tablet (10 mg total) by mouth every 6 (six) hours as needed for nausea or vomiting. 30 tablet 6   ramipril (ALTACE) 2.5 MG tablet Take 2.5 mg by mouth daily.       simvastatin (ZOCOR) 40 MG tablet Take 40 mg by mouth at bedtime.       traMADol (ULTRAM) 50 MG tablet Take 1 tablet (50 mg total) by mouth every 12 (twelve) hours as needed. 30 tablet 0   traZODone (DESYREL) 50 MG tablet Take 50 mg by mouth at bedtime.      venlafaxine (EFFEXOR) 75 MG tablet Take 75 mg by mouth daily.     vitamin E 400 UNIT capsule Take 400 Units by mouth daily.     zonisamide (ZONEGRAN) 50 MG capsule Take 50-100 mg by mouth See admin instructions. Take 50mg  by mouth in the morning and 100mg  at night.     No current facility-administered medications for this visit.    REVIEW OF SYSTEMS:    10 Point review of Systems was done is negative except as noted above.   PHYSICAL EXAMINATION: ECOG PERFORMANCE STATUS: 1 - Symptomatic but completely ambulatory   GENERAL:alert, in no acute distress and comfortable SKIN: no acute rashes, no significant lesions EYES: conjunctiva are pink and non-injected, sclera anicteric OROPHARYNX: MMM, no exudates, no oropharyngeal erythema or ulceration  NECK: supple, no JVD LYMPH:  no palpable lymphadenopathy in the cervical, axillary or inguinal regions LUNGS: clear to auscultation b/l with normal respiratory effort HEART: regular rate & rhythm ABDOMEN:  normoactive bowel sounds , non tender, not distended. Extremity: no pedal edema PSYCH: alert & oriented x 3 with fluent speech NEURO: no focal motor/sensory deficits   LABORATORY DATA:  I have reviewed the data as listed    Latest Ref Rng & Units 01/02/2023    9:52 AM 12/11/2022     9:02 AM 11/20/2022    9:48 AM  CBC  WBC 4.0 - 10.5 K/uL 3.8  3.9  5.0   Hemoglobin 12.0 - 15.0 g/dL 16.1  09.6  04.5   Hematocrit 36.0 - 46.0 % 35.0  36.6  37.7   Platelets 150 - 400 K/uL 102  100  120       Latest Ref Rng & Units 01/02/2023    9:52 AM 12/11/2022    9:02 AM 11/20/2022    9:48 AM  CMP  Glucose 70 - 99 mg/dL 97  98  409   BUN 8 - 23 mg/dL 14  17  19    Creatinine 0.44 - 1.00 mg/dL 8.11  9.14  7.82   Sodium 135 - 145 mmol/L 140  139  141   Potassium 3.5 - 5.1 mmol/L 4.2  3.8  3.9   Chloride 98 - 111 mmol/L 107  108  108   CO2 22 - 32 mmol/L 27  25  26    Calcium 8.9 - 10.3 mg/dL 9.4  9.0  8.9   Total Protein 6.5 - 8.1 g/dL 6.7  6.4  6.4   Total Bilirubin 0.3 - 1.2 mg/dL 0.6  0.5  0.4   Alkaline Phos 38 - 126 U/L 88  103  200   AST 15 - 41 U/L 20  18  16    ALT 0 - 44 U/L 16  15  12     Lab Results  Component Value Date   LDH 239 (H) 11/15/2022   Molecular Pathology 10/21/2022:    Surgical Pathology 10/04/22: A. LYMPH NODE, RIGHT LEVEL 2 DEEP CERVICAL, EXCISION:  -Diffuse large B-cell lymphoma  -See comment COMMENT: The sections show diffuse effacement of the lymph nodal architecture primarily by a population of large lymphoid cells characterized by vesicular chromatin and small nucleoli associated with apoptosis and brisk mitosis.  In some areas, the large atypical lymphoid proliferation extends into the perinodal adipose tissue.  In this background, there are scattered variably sized and somewhat disrupted aggregates of primarily small lymphoid cells characterized by high nuclear cytoplasmic ratio, angulated nuclear contours and small to inconspicuous nucleoli. Flow cytometric analysis was performed [facility] [id number]) and shows a monoclonal, lambda restricted B-cell population expressing CD10.  In addition, immunohistochemical stains for CD3, CD5, CD10, CD20, PAX5, BCL6, Bcl-2, Ki-67, CD30, CD138, CD21, EBV in addition to in situ hybridization for kappa and  lambda were performed with appropriate controls.  The large lymphoid cells are positive for CD20, PAX5, CD10, BCL6, Bcl-2, and partially for cytoplasmic lambda.  No significant staining is seen with EBV, CD30, CD138 or cytoplasmic kappa.  Ki67 shows variably increased expression (more than 50% in some areas). CD21 highlights scattered somewhat disrupted follicular dendritic networks. The lymphoid aggregates of primarily small lymphoid cells previously described show positivity for B-cell markers CD20 and PAX5 in addition to CD10 and Bcl-2.  There is an admixed variable T-cell component in the background as seen with CD3 and CD5  and there is no apparent co-expression of CD5 in B-cell areas.  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   RADIOGRAPHIC STUDIES: I have personally reviewed the radiological images as listed and agreed with the findings in the report. NM PET Image Restag (PS) Skull Base To Thigh  Result Date: 12/29/2022 CLINICAL DATA:  Subsequent treatment strategy for diffuse large B-cell lymphoma. EXAM: NUCLEAR MEDICINE PET SKULL BASE TO THIGH TECHNIQUE: 7.8 mCi F-18 FDG was injected intravenously. Full-ring PET imaging was performed from the skull base to thigh after the radiotracer. CT data was obtained and used for attenuation correction and anatomic localization. Fasting blood glucose: 91 mg/dl COMPARISON:  PET-CT dated 09/04/2022 FINDINGS: Mediastinal blood pool activity: SUV max 2.4 Liver activity: SUV max 3.4 NECK: Prior hypermetabolic base of tongue/peritonsillar lesion has resolved. Prior hypermetabolic bilateral cervical lymphadenopathy has resolved. Incidental CT findings: None. CHEST: No hypermetabolic thoracic lymphadenopathy. Prior medial left upper lobe nodule has resolved. However, there is a 5 mm central right upper lobe nodule  (series 7/image 25), max SUV 1.9, previously 4 mm. This is considered indeterminate. Right chest port terminates the cavoatrial junction. Incidental CT findings: Atherosclerotic calcifications of the aortic arch. Moderate coronary atherosclerosis of the left circumflex. Pericardial calcifications at the left ventricular apex. Left subclavian ICD. ABDOMEN/PELVIS: No abnormal hypermetabolism in the liver, pancreas, or adrenal glands. Spleen is within normal limits for size, measuring 12.0 cm in maximal craniocaudal dimension. No focal lesion is seen. Reference max SUV 3.0. No hypermetabolic abdominopelvic lymphadenopathy. Prior jejunal mesenteric stranding with subcentimeter lymph nodes has essentially resolved. Incidental CT findings: Mild sigmoid diverticulosis, without evidence of diverticulitis. Atherosclerotic calcifications abdominal aorta and branch vessels. SKELETON: No focal hypermetabolic activity to suggest skeletal metastasis. Incidental CT findings: Degenerative changes of the lumbar spine. Bilateral hip arthroplasties. Healing right inferior pubic ramus fracture (series 4/image 136), new from the prior. Mild sclerosis along the right sacrum (series 4/image 142) is new from the prior and suggests a possible sacral insufficiency fracture. IMPRESSION: Complete/near-complete metabolic response, without residual lymphadenopathy or focal lesion. Deauville criteria 1. 5 mm central right upper lobe nodule, without convincing hypermetabolism. Attention on follow-up is suggested. Spleen is within normal limits for size, without focal lesion. Healing right inferior pubic ramus fracture, new from the prior. Suspected interval right sacral insufficiency fracture. Electronically Signed   By: Charline Bills M.D.   On: 12/29/2022 23:52   DG MYELOGRAPHY LUMBAR INJ LUMBOSACRAL  Result Date: 12/08/2022 CLINICAL DATA:  Displacement of lumbar intervertebral disc without myelopathy. Low back pain and right groin pain.  EXAM: LUMBAR MYELOGRAM FLUOROSCOPY: Radiation Exposure Index (as provided by the fluoroscopic device): 18.80 mGy Kerma PROCEDURE: After thorough discussion of risks and benefits of the procedure including bleeding, infection, injury to nerves, blood vessels, adjacent structures as well as headache and CSF leak, written and oral informed consent was obtained. Consent was obtained by Dr. Sebastian Ache. Time out form was completed. Patient was positioned prone on the fluoroscopy table. Local anesthesia was provided with 1% lidocaine without epinephrine after prepped and draped in the usual sterile fashion. Puncture was performed at L5-S1 using a 3 1/2 inch 22-gauge spinal needle via a left interlaminar approach. Using a single pass through the dura, the needle was placed within the thecal sac, with return of clear CSF. 15 mL of Isovue M-200 was injected into the thecal sac, with normal opacification of  the nerve roots and cauda equina consistent with free flow within the subarachnoid space. I personally performed the lumbar puncture and administered the intrathecal contrast. I also personally supervised acquisition of the myelogram images. TECHNIQUE: Contiguous axial images were obtained through the Lumbar spine after the intrathecal infusion of contrast. Coronal and sagittal reconstructions were obtained of the axial image sets. COMPARISON:  Lumbar spine radiographs 03/14/2017 FINDINGS: LUMBAR MYELOGRAM FINDINGS: There are 5 non rib-bearing lumbar type vertebrae. Grade 1 retrolisthesis is noted L3 on L4 and L4 on L5. There is no evidence of dynamic instability on flexion or extension radiographs, although range of motion appears very limited. At L4-5, a ventral extradural defect contributes to moderate spinal stenosis, and there is evidence of bilateral lateral recess stenosis with effacement of the L5 nerve roots. There is milder spinal stenosis at L3-4. CT LUMBAR MYELOGRAM FINDINGS: There is mild-to-moderate lumbar  dextroscoliosis, and there is grade 1 retrolisthesis of L3 on L4 and L4 on L5. No fracture or suspicious osseous lesion is identified. Interbody ankylosis is noted at L2-3 and to a lesser extent L3-4, and there is likely partial facet ankylosis at these levels as well. The conus medullaris terminates at L1-2. There is abdominal aortic atherosclerosis without aneurysm. T11-12: Mild disc bulging, small partially calcified left paracentral disc protrusion, and severe facet arthrosis without significant stenosis. T12-L1: Minimal disc bulging, a shallow right subarticular disc protrusion, and moderate to severe facet arthrosis without significant stenosis. L1-2: Disc bulging, a left central to left foraminal disc osteophyte complex, and moderate facet arthrosis result in mild left lateral recess stenosis without significant spinal or neural foraminal stenosis. L2-3: Interbody fusion. Endplate spurring and moderate to severe facet hypertrophy result in mild left lateral recess stenosis without significant spinal or neural foraminal stenosis. L3-4: Interbody fusion. Disc bulging, endplate spurring, and moderate to severe facet and ligamentum flavum hypertrophy result in mild spinal stenosis, mild right greater than left lateral recess stenosis, and mild left neural foraminal stenosis. L4-5: Right eccentric disc bulging, endplate spurring, asymmetrically severe right-sided disc space height loss, and severe facet and ligamentum flavum hypertrophy result in moderate spinal stenosis, moderate bilateral lateral recess stenosis, and moderate to severe right and mild left neural foraminal stenosis. Potential right L4 and bilateral L5 nerve root impingement. L5-S1: Disc bulging, asymmetric left-sided endplate spurring, and severe facet hypertrophy result in moderate left neural foraminal stenosis with potential left L5 nerve root impingement. No spinal stenosis. IMPRESSION: 1. Advanced lumbar disc and facet degeneration. 2.  Moderate spinal stenosis and moderate to severe right neural foraminal stenosis at L4-5. 3. Moderate left neural foraminal stenosis at L5-S1. 4. Mild spinal stenosis at L3-4. 5.  Aortic Atherosclerosis (ICD10-I70.0). Electronically Signed   By: Sebastian Ache M.D.   On: 12/08/2022 16:38   CT LUMBAR SPINE W CONTRAST  Result Date: 12/08/2022 CLINICAL DATA:  Displacement of lumbar intervertebral disc without myelopathy. Low back pain and right groin pain. EXAM: LUMBAR MYELOGRAM FLUOROSCOPY: Radiation Exposure Index (as provided by the fluoroscopic device): 18.80 mGy Kerma PROCEDURE: After thorough discussion of risks and benefits of the procedure including bleeding, infection, injury to nerves, blood vessels, adjacent structures as well as headache and CSF leak, written and oral informed consent was obtained. Consent was obtained by Dr. Sebastian Ache. Time out form was completed. Patient was positioned prone on the fluoroscopy table. Local anesthesia was provided with 1% lidocaine without epinephrine after prepped and draped in the usual sterile fashion. Puncture was performed at L5-S1 using a 3  1/2 inch 22-gauge spinal needle via a left interlaminar approach. Using a single pass through the dura, the needle was placed within the thecal sac, with return of clear CSF. 15 mL of Isovue M-200 was injected into the thecal sac, with normal opacification of the nerve roots and cauda equina consistent with free flow within the subarachnoid space. I personally performed the lumbar puncture and administered the intrathecal contrast. I also personally supervised acquisition of the myelogram images. TECHNIQUE: Contiguous axial images were obtained through the Lumbar spine after the intrathecal infusion of contrast. Coronal and sagittal reconstructions were obtained of the axial image sets. COMPARISON:  Lumbar spine radiographs 03/14/2017 FINDINGS: LUMBAR MYELOGRAM FINDINGS: There are 5 non rib-bearing lumbar type vertebrae. Grade  1 retrolisthesis is noted L3 on L4 and L4 on L5. There is no evidence of dynamic instability on flexion or extension radiographs, although range of motion appears very limited. At L4-5, a ventral extradural defect contributes to moderate spinal stenosis, and there is evidence of bilateral lateral recess stenosis with effacement of the L5 nerve roots. There is milder spinal stenosis at L3-4. CT LUMBAR MYELOGRAM FINDINGS: There is mild-to-moderate lumbar dextroscoliosis, and there is grade 1 retrolisthesis of L3 on L4 and L4 on L5. No fracture or suspicious osseous lesion is identified. Interbody ankylosis is noted at L2-3 and to a lesser extent L3-4, and there is likely partial facet ankylosis at these levels as well. The conus medullaris terminates at L1-2. There is abdominal aortic atherosclerosis without aneurysm. T11-12: Mild disc bulging, small partially calcified left paracentral disc protrusion, and severe facet arthrosis without significant stenosis. T12-L1: Minimal disc bulging, a shallow right subarticular disc protrusion, and moderate to severe facet arthrosis without significant stenosis. L1-2: Disc bulging, a left central to left foraminal disc osteophyte complex, and moderate facet arthrosis result in mild left lateral recess stenosis without significant spinal or neural foraminal stenosis. L2-3: Interbody fusion. Endplate spurring and moderate to severe facet hypertrophy result in mild left lateral recess stenosis without significant spinal or neural foraminal stenosis. L3-4: Interbody fusion. Disc bulging, endplate spurring, and moderate to severe facet and ligamentum flavum hypertrophy result in mild spinal stenosis, mild right greater than left lateral recess stenosis, and mild left neural foraminal stenosis. L4-5: Right eccentric disc bulging, endplate spurring, asymmetrically severe right-sided disc space height loss, and severe facet and ligamentum flavum hypertrophy result in moderate spinal  stenosis, moderate bilateral lateral recess stenosis, and moderate to severe right and mild left neural foraminal stenosis. Potential right L4 and bilateral L5 nerve root impingement. L5-S1: Disc bulging, asymmetric left-sided endplate spurring, and severe facet hypertrophy result in moderate left neural foraminal stenosis with potential left L5 nerve root impingement. No spinal stenosis. IMPRESSION: 1. Advanced lumbar disc and facet degeneration. 2. Moderate spinal stenosis and moderate to severe right neural foraminal stenosis at L4-5. 3. Moderate left neural foraminal stenosis at L5-S1. 4. Mild spinal stenosis at L3-4. 5.  Aortic Atherosclerosis (ICD10-I70.0). Electronically Signed   By: Sebastian Ache M.D.   On: 12/08/2022 16:38   CUP PACEART REMOTE DEVICE CHECK  Result Date: 12/06/2022 Scheduled remote reviewed. Normal device function.  There was one NSVT arrhythmia detected that was 1:1 conduction and therefore supraventricular in origin. The device estimates 3 months until [employer] Next remote 01/04/2023. Hassell Halim, RN, CCDS, [employer]   NM PET 09/04/2022: IMPRESSION: 1. There is a large tracer avid mass centered within the left base of tongue and left lingual region which extends into the  hypopharyngeal region ventral to the epiglottis. Imaging findings are compatible with a primary head and neck malignancy. 2. A smaller focus of increased uptake localizes to the right pharyngeal tonsil region. Indeterminate. 3. Bilateral tracer avid cervical and supraclavicular lymph nodes compatible with nodal metastasis. 4. There is a 7 mm tracer avid nodule within the anteromedial left upper lobe which is suspicious for pulmonary metastasis. 5. There is increased uptake identified along the long axis of the posterior right seventh rib. No corresponding lytic or sclerotic changes identified on the CT images. Cannot exclude bone metastases. 6. Multiple prominent retroperitoneal and mesenteric  lymph nodes are identified which exhibit mild tracer uptake. This is a nonspecific finding and may reflect reactive adenopathy. Metastatic adenopathy cannot be excluded. Attention on future surveillance imaging is advised. 7.  Aortic Atherosclerosis (ICD10-I70.0).   ASSESSMENT & PLAN:   77 y.o.  female with:   1. 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 2. 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 CBC normal, showed WBC of 3.8K, hemoglobin of 11.6, and platelets of 102K.  -CMP normal -discussed results of 12/25/2022 PET scan which shows that prior hypermetabolic base of tongue/peritonsillar lesion has resolved. Prior bilateral cervical lymphadenopathy has resolved. Appears to have Complete/near-complete metabolic response, without residual lymphadenopathy. Patient has has excellent response to treatment. Right inferior pubic ramus fracture and interval right sacral insufficiency fracture healing. -Patient tolerated cycle 3 of her R-CEOP well without any toxicities.  -continue R-CEOP treatment for a total of 6 cycles -informed patient that the high grade component of her disease may be curative, though the low grade component may return -answered all of patient's and her friend's questions in detail regarding immunotherapy, imaging, and treatment  FOLLOW-UP: Follow-up for cycle 5 and cycle 6 of R-CEOP chemotherapy as per integrated scheduling with port flush labs and MD visits with each cycle.  The total time spent in the appointment was 32 minutes* .  All of the patient's questions were answered with apparent satisfaction. The patient knows to call the clinic  with any problems, questions or concerns.   Wyvonnia Lora MD MS AAHIVMS [facility] Hematology/Oncology Physician [facility]  .*Total Encounter Time as defined by the Centers for Medicare and Medicaid Services includes, in addition to the face-to-face time of a patient visit (documented in the note above) non-face-to-face time: obtaining and reviewing outside history, ordering and reviewing medications, tests or procedures, care coordination (communications with other health care professionals or caregivers) and documentation in the medical record.    I,Mitra Faeizi,acting as a Neurosurgeon for Wyvonnia Lora, MD.,have documented all relevant documentation on the behalf of Wyvonnia Lora, MD,as directed by  Wyvonnia Lora, MD while in the presence of Wyvonnia Lora, MD.  .I have reviewed the above documentation for accuracy and completeness, and I agree with the above. Johney Maine MD

## 2023-01-02 NOTE — Patient Instructions (Signed)
[facility]  Discharge Instructions: Thank you for choosing [facility] to provide your oncology and hematology care.   If you have a lab appointment with the Cancer Center, please go directly to the Cancer Center and check in at the registration area.   Wear comfortable clothing and clothing appropriate for easy access to any Portacath or PICC line.   We strive to give you quality time with your provider. You may need to reschedule your appointment if you arrive late (15 or more minutes).  Arriving late affects you and other patients whose appointments are after yours.  Also, if you miss three or more appointments without notifying the office, you may be dismissed from the clinic at the provider's discretion.      For prescription refill requests, have your pharmacy contact our office and allow 72 hours for refills to be completed.    Today you received the following chemotherapy and/or immunotherapy agents : Vincristine, Cytoxan, Etoposide, Rituximab      To help prevent nausea and vomiting after your treatment, we encourage you to take your nausea medication as directed.  BELOW ARE SYMPTOMS THAT SHOULD BE REPORTED IMMEDIATELY: *FEVER GREATER THAN 100.4 F (38 C) OR HIGHER *CHILLS OR SWEATING *NAUSEA AND VOMITING THAT IS NOT CONTROLLED WITH YOUR NAUSEA MEDICATION *UNUSUAL SHORTNESS OF BREATH *UNUSUAL BRUISING OR BLEEDING *URINARY PROBLEMS (pain or burning when urinating, or frequent urination) *BOWEL PROBLEMS (unusual diarrhea, constipation, pain near the anus) TENDERNESS IN MOUTH AND THROAT WITH OR WITHOUT PRESENCE OF ULCERS (sore throat, sores in mouth, or a toothache) UNUSUAL RASH, SWELLING OR PAIN  UNUSUAL VAGINAL DISCHARGE OR ITCHING   Items with * indicate a potential emergency and should be followed up as soon as possible or go to the Emergency Department if any problems should occur.  Please show the CHEMOTHERAPY ALERT CARD or  IMMUNOTHERAPY ALERT CARD at check-in to the Emergency Department and triage nurse.  Should you have questions after your visit or need to cancel or reschedule your appointment, please contact [facility]  Dept: [phone]  and follow the prompts.  Office hours are 8:00 a.m. to 4:30 p.m. Monday - Friday. Please note that voicemails left after 4:00 p.m. may not be returned until the following business day.  We are closed weekends and major holidays. You have access to a nurse at all times for urgent questions. Please call the main number to the clinic Dept: [phone] and follow the prompts.   For any non-urgent questions, you may also contact your provider using MyChart. We now offer e-Visits for anyone 18 and older to request care online for non-urgent symptoms. For details visit [email or web address].   Also download the MyChart app! Go to the app store, search "MyChart", open the app, select [facility], and log in with your MyChart username and password.   

## 2023-01-03 ENCOUNTER — Other Ambulatory Visit: Payer: Self-pay

## 2023-01-03 ENCOUNTER — Inpatient Hospital Stay: Payer: Medicare HMO

## 2023-01-03 VITALS — BP 145/72 | HR 75 | Temp 98.4°F | Resp 18

## 2023-01-03 DIAGNOSIS — C8331 Diffuse large B-cell lymphoma, lymph nodes of head, face, and neck: Secondary | ICD-10-CM | POA: Diagnosis not present

## 2023-01-03 DIAGNOSIS — Z5111 Encounter for antineoplastic chemotherapy: Secondary | ICD-10-CM | POA: Diagnosis not present

## 2023-01-03 DIAGNOSIS — Z5112 Encounter for antineoplastic immunotherapy: Secondary | ICD-10-CM | POA: Diagnosis not present

## 2023-01-03 DIAGNOSIS — Z5189 Encounter for other specified aftercare: Secondary | ICD-10-CM | POA: Diagnosis not present

## 2023-01-03 DIAGNOSIS — M545 Low back pain, unspecified: Secondary | ICD-10-CM | POA: Diagnosis not present

## 2023-01-03 DIAGNOSIS — C8338 Diffuse large B-cell lymphoma, lymph nodes of multiple sites: Secondary | ICD-10-CM

## 2023-01-03 DIAGNOSIS — Z7189 Other specified counseling: Secondary | ICD-10-CM

## 2023-01-03 MED ORDER — PROCHLORPERAZINE MALEATE 10 MG PO TABS
10.0000 mg | ORAL_TABLET | Freq: Once | ORAL | Status: AC
  Administered 2023-01-03: 10 mg via ORAL
  Filled 2023-01-03: qty 1

## 2023-01-03 MED ORDER — SODIUM CHLORIDE 0.9 % IV SOLN: Freq: Once | INTRAVENOUS | Status: AC

## 2023-01-03 MED ORDER — SODIUM CHLORIDE 0.9% FLUSH
10.0000 mL | INTRAVENOUS | Status: DC | PRN
  Administered 2023-01-03: 10 mL

## 2023-01-03 MED ORDER — HEPARIN SOD (PORK) LOCK FLUSH 100 UNIT/ML IV SOLN
500.0000 [IU] | Freq: Once | INTRAVENOUS | Status: AC | PRN
  Administered 2023-01-03: 500 [IU]

## 2023-01-03 MED ORDER — SODIUM CHLORIDE 0.9 % IV SOLN
50.0000 mg/m2 | Freq: Once | INTRAVENOUS | Status: AC
  Administered 2023-01-03: 100 mg via INTRAVENOUS
  Filled 2023-01-03: qty 5

## 2023-01-03 NOTE — Patient Instructions (Signed)
[facility]  Discharge Instructions: Thank you for choosing [facility] to provide your oncology and hematology care.   If you have a lab appointment with the Cancer Center, please go directly to the Cancer Center and check in at the registration area.   Wear comfortable clothing and clothing appropriate for easy access to any Portacath or PICC line.   We strive to give you quality time with your provider. You may need to reschedule your appointment if you arrive late (15 or more minutes).  Arriving late affects you and other patients whose appointments are after yours.  Also, if you miss three or more appointments without notifying the office, you may be dismissed from the clinic at the provider's discretion.      For prescription refill requests, have your pharmacy contact our office and allow 72 hours for refills to be completed.    Today you received the following chemotherapy and/or immunotherapy agents; Etoposide      To help prevent nausea and vomiting after your treatment, we encourage you to take your nausea medication as directed.  BELOW ARE SYMPTOMS THAT SHOULD BE REPORTED IMMEDIATELY: *FEVER GREATER THAN 100.4 F (38 C) OR HIGHER *CHILLS OR SWEATING *NAUSEA AND VOMITING THAT IS NOT CONTROLLED WITH YOUR NAUSEA MEDICATION *UNUSUAL SHORTNESS OF BREATH *UNUSUAL BRUISING OR BLEEDING *URINARY PROBLEMS (pain or burning when urinating, or frequent urination) *BOWEL PROBLEMS (unusual diarrhea, constipation, pain near the anus) TENDERNESS IN MOUTH AND THROAT WITH OR WITHOUT PRESENCE OF ULCERS (sore throat, sores in mouth, or a toothache) UNUSUAL RASH, SWELLING OR PAIN  UNUSUAL VAGINAL DISCHARGE OR ITCHING   Items with * indicate a potential emergency and should be followed up as soon as possible or go to the Emergency Department if any problems should occur.  Please show the CHEMOTHERAPY ALERT CARD or IMMUNOTHERAPY ALERT CARD at  check-in to the Emergency Department and triage nurse.  Should you have questions after your visit or need to cancel or reschedule your appointment, please contact [facility]  Dept: [phone]  and follow the prompts.  Office hours are 8:00 a.m. to 4:30 p.m. Monday - Friday. Please note that voicemails left after 4:00 p.m. may not be returned until the following business day.  We are closed weekends and major holidays. You have access to a nurse at all times for urgent questions. Please call the main number to the clinic Dept: [phone] and follow the prompts.   For any non-urgent questions, you may also contact your provider using MyChart. We now offer e-Visits for anyone 18 and older to request care online for non-urgent symptoms. For details visit [email or web address].   Also download the MyChart app! Go to the app store, search "MyChart", open the app, select [facility], and log in with your MyChart username and password.   

## 2023-01-04 ENCOUNTER — Inpatient Hospital Stay: Payer: Medicare HMO

## 2023-01-04 ENCOUNTER — Ambulatory Visit (INDEPENDENT_AMBULATORY_CARE_PROVIDER_SITE_OTHER): Payer: Medicare HMO

## 2023-01-04 VITALS — BP 114/49 | HR 70 | Temp 98.1°F | Resp 14

## 2023-01-04 DIAGNOSIS — Z5112 Encounter for antineoplastic immunotherapy: Secondary | ICD-10-CM | POA: Diagnosis not present

## 2023-01-04 DIAGNOSIS — C8331 Diffuse large B-cell lymphoma, lymph nodes of head, face, and neck: Secondary | ICD-10-CM | POA: Diagnosis not present

## 2023-01-04 DIAGNOSIS — Z5189 Encounter for other specified aftercare: Secondary | ICD-10-CM | POA: Diagnosis not present

## 2023-01-04 DIAGNOSIS — Z7189 Other specified counseling: Secondary | ICD-10-CM

## 2023-01-04 DIAGNOSIS — C8338 Diffuse large B-cell lymphoma, lymph nodes of multiple sites: Secondary | ICD-10-CM

## 2023-01-04 DIAGNOSIS — I255 Ischemic cardiomyopathy: Secondary | ICD-10-CM

## 2023-01-04 DIAGNOSIS — Z5111 Encounter for antineoplastic chemotherapy: Secondary | ICD-10-CM | POA: Diagnosis not present

## 2023-01-04 MED ORDER — PROCHLORPERAZINE MALEATE 10 MG PO TABS
10.0000 mg | ORAL_TABLET | Freq: Once | ORAL | Status: AC
  Administered 2023-01-04: 10 mg via ORAL
  Filled 2023-01-04: qty 1

## 2023-01-04 MED ORDER — SODIUM CHLORIDE 0.9% FLUSH
10.0000 mL | INTRAVENOUS | Status: DC | PRN
  Administered 2023-01-04: 10 mL

## 2023-01-04 MED ORDER — SODIUM CHLORIDE 0.9 % IV SOLN
50.0000 mg/m2 | Freq: Once | INTRAVENOUS | Status: AC
  Administered 2023-01-04: 100 mg via INTRAVENOUS
  Filled 2023-01-04: qty 5

## 2023-01-04 MED ORDER — HEPARIN SOD (PORK) LOCK FLUSH 100 UNIT/ML IV SOLN
500.0000 [IU] | Freq: Once | INTRAVENOUS | Status: AC | PRN
  Administered 2023-01-04: 500 [IU]

## 2023-01-04 MED ORDER — SODIUM CHLORIDE 0.9 % IV SOLN: Freq: Once | INTRAVENOUS | Status: AC

## 2023-01-04 NOTE — Patient Instructions (Signed)
[facility]  Discharge Instructions: Thank you for choosing [facility] to provide your oncology and hematology care.   If you have a lab appointment with the Cancer Center, please go directly to the Cancer Center and check in at the registration area.   Wear comfortable clothing and clothing appropriate for easy access to any Portacath or PICC line.   We strive to give you quality time with your provider. You may need to reschedule your appointment if you arrive late (15 or more minutes).  Arriving late affects you and other patients whose appointments are after yours.  Also, if you miss three or more appointments without notifying the office, you may be dismissed from the clinic at the provider's discretion.      For prescription refill requests, have your pharmacy contact our office and allow 72 hours for refills to be completed.    Today you received the following chemotherapy and/or immunotherapy agents: etoposide      To help prevent nausea and vomiting after your treatment, we encourage you to take your nausea medication as directed.  BELOW ARE SYMPTOMS THAT SHOULD BE REPORTED IMMEDIATELY: *FEVER GREATER THAN 100.4 F (38 C) OR HIGHER *CHILLS OR SWEATING *NAUSEA AND VOMITING THAT IS NOT CONTROLLED WITH YOUR NAUSEA MEDICATION *UNUSUAL SHORTNESS OF BREATH *UNUSUAL BRUISING OR BLEEDING *URINARY PROBLEMS (pain or burning when urinating, or frequent urination) *BOWEL PROBLEMS (unusual diarrhea, constipation, pain near the anus) TENDERNESS IN MOUTH AND THROAT WITH OR WITHOUT PRESENCE OF ULCERS (sore throat, sores in mouth, or a toothache) UNUSUAL RASH, SWELLING OR PAIN  UNUSUAL VAGINAL DISCHARGE OR ITCHING   Items with * indicate a potential emergency and should be followed up as soon as possible or go to the Emergency Department if any problems should occur.  Please show the CHEMOTHERAPY ALERT CARD or IMMUNOTHERAPY ALERT CARD at  check-in to the Emergency Department and triage nurse.  Should you have questions after your visit or need to cancel or reschedule your appointment, please contact [facility]  Dept: [phone]  and follow the prompts.  Office hours are 8:00 a.m. to 4:30 p.m. Monday - Friday. Please note that voicemails left after 4:00 p.m. may not be returned until the following business day.  We are closed weekends and major holidays. You have access to a nurse at all times for urgent questions. Please call the main number to the clinic Dept: [phone] and follow the prompts.   For any non-urgent questions, you may also contact your provider using MyChart. We now offer e-Visits for anyone 18 and older to request care online for non-urgent symptoms. For details visit [email or web address].   Also download the MyChart app! Go to the app store, search "MyChart", open the app, select [facility], and log in with your MyChart username and password.   

## 2023-01-05 LAB — CUP PACEART REMOTE DEVICE CHECK
Battery Remaining Longevity: 2 mo
Battery Voltage: 2.8 V
Brady Statistic AP VP Percent: 0 %
Brady Statistic AP VS Percent: 5.12 %
Brady Statistic AS VP Percent: 0.03 %
Brady Statistic AS VS Percent: 94.85 %
Brady Statistic RA Percent Paced: 5.08 %
Brady Statistic RV Percent Paced: 0.03 %
Date Time Interrogation Session: 20240627184229
HighPow Impedance: 48 Ohm
HighPow Impedance: 65 Ohm
Implantable Lead Connection Status: 753985
Implantable Lead Connection Status: 753985
Implantable Lead Implant Date: 20071003
Implantable Lead Implant Date: 20071019
Implantable Lead Location: 753859
Implantable Lead Location: 753860
Implantable Lead Model: 5076
Implantable Lead Model: 6947
Implantable Pulse Generator Implant Date: 20140729
Lead Channel Impedance Value: 285 Ohm
Lead Channel Impedance Value: 361 Ohm
Lead Channel Impedance Value: 418 Ohm
Lead Channel Pacing Threshold Amplitude: 0.5 V
Lead Channel Pacing Threshold Amplitude: 1.125 V
Lead Channel Pacing Threshold Pulse Width: 0.4 ms
Lead Channel Pacing Threshold Pulse Width: 0.4 ms
Lead Channel Sensing Intrinsic Amplitude: 2.625 mV
Lead Channel Sensing Intrinsic Amplitude: 2.625 mV
Lead Channel Sensing Intrinsic Amplitude: 6.25 mV
Lead Channel Sensing Intrinsic Amplitude: 6.25 mV
Lead Channel Setting Pacing Amplitude: 2 V
Lead Channel Setting Pacing Amplitude: 2.5 V
Lead Channel Setting Pacing Pulse Width: 0.4 ms
Lead Channel Setting Sensing Sensitivity: 0.3 mV
Zone Setting Status: 755011

## 2023-01-06 ENCOUNTER — Inpatient Hospital Stay: Payer: Medicare HMO

## 2023-01-06 VITALS — BP 128/57 | HR 89 | Temp 97.9°F | Resp 18

## 2023-01-06 DIAGNOSIS — Z7189 Other specified counseling: Secondary | ICD-10-CM

## 2023-01-06 DIAGNOSIS — Z5112 Encounter for antineoplastic immunotherapy: Secondary | ICD-10-CM | POA: Diagnosis not present

## 2023-01-06 DIAGNOSIS — Z5189 Encounter for other specified aftercare: Secondary | ICD-10-CM | POA: Diagnosis not present

## 2023-01-06 DIAGNOSIS — C8338 Diffuse large B-cell lymphoma, lymph nodes of multiple sites: Secondary | ICD-10-CM

## 2023-01-06 DIAGNOSIS — Z5111 Encounter for antineoplastic chemotherapy: Secondary | ICD-10-CM | POA: Diagnosis not present

## 2023-01-06 DIAGNOSIS — C8331 Diffuse large B-cell lymphoma, lymph nodes of head, face, and neck: Secondary | ICD-10-CM | POA: Diagnosis not present

## 2023-01-06 MED ORDER — PEGFILGRASTIM-CBQV 6 MG/0.6ML ~~LOC~~ SOSY
6.0000 mg | PREFILLED_SYRINGE | Freq: Once | SUBCUTANEOUS | Status: AC
  Administered 2023-01-06: 6 mg via SUBCUTANEOUS
  Filled 2023-01-06: qty 0.6

## 2023-01-08 ENCOUNTER — Encounter: Payer: Self-pay | Admitting: Hematology

## 2023-01-12 DIAGNOSIS — M5416 Radiculopathy, lumbar region: Secondary | ICD-10-CM | POA: Diagnosis not present

## 2023-01-14 ENCOUNTER — Other Ambulatory Visit: Payer: Self-pay | Admitting: Hematology

## 2023-01-14 DIAGNOSIS — Z7189 Other specified counseling: Secondary | ICD-10-CM

## 2023-01-14 DIAGNOSIS — C8338 Diffuse large B-cell lymphoma, lymph nodes of multiple sites: Secondary | ICD-10-CM

## 2023-01-15 ENCOUNTER — Encounter: Payer: Self-pay | Admitting: Hematology

## 2023-01-18 NOTE — Progress Notes (Signed)
Remote ICD transmission.   

## 2023-01-18 NOTE — Addendum Note (Signed)
Addended by: Elease Etienne A on: 01/18/2023 11:37 AM   Modules accepted: Level of Service

## 2023-01-19 MED FILL — Dexamethasone Sodium Phosphate Inj 100 MG/10ML: INTRAMUSCULAR | Qty: 1 | Status: AC

## 2023-01-22 ENCOUNTER — Inpatient Hospital Stay: Payer: Medicare HMO | Attending: Hematology

## 2023-01-22 ENCOUNTER — Inpatient Hospital Stay: Payer: Medicare HMO

## 2023-01-22 ENCOUNTER — Inpatient Hospital Stay (HOSPITAL_BASED_OUTPATIENT_CLINIC_OR_DEPARTMENT_OTHER): Payer: Medicare HMO | Admitting: Hematology

## 2023-01-22 ENCOUNTER — Other Ambulatory Visit: Payer: Self-pay

## 2023-01-22 VITALS — BP 108/56 | HR 76 | Temp 98.1°F | Resp 18

## 2023-01-22 VITALS — BP 118/62 | HR 98 | Temp 97.5°F | Resp 18 | Wt 152.6 lb

## 2023-01-22 DIAGNOSIS — C8338 Diffuse large B-cell lymphoma, lymph nodes of multiple sites: Secondary | ICD-10-CM

## 2023-01-22 DIAGNOSIS — Z79899 Other long term (current) drug therapy: Secondary | ICD-10-CM | POA: Diagnosis not present

## 2023-01-22 DIAGNOSIS — Z5189 Encounter for other specified aftercare: Secondary | ICD-10-CM | POA: Diagnosis not present

## 2023-01-22 DIAGNOSIS — Z5111 Encounter for antineoplastic chemotherapy: Secondary | ICD-10-CM

## 2023-01-22 DIAGNOSIS — Z7189 Other specified counseling: Secondary | ICD-10-CM

## 2023-01-22 DIAGNOSIS — C8331 Diffuse large B-cell lymphoma, lymph nodes of head, face, and neck: Secondary | ICD-10-CM | POA: Diagnosis not present

## 2023-01-22 DIAGNOSIS — Z5112 Encounter for antineoplastic immunotherapy: Secondary | ICD-10-CM | POA: Diagnosis not present

## 2023-01-22 DIAGNOSIS — Z95828 Presence of other vascular implants and grafts: Secondary | ICD-10-CM

## 2023-01-22 LAB — CMP (CANCER CENTER ONLY)
ALT: 19 U/L (ref 0–44)
AST: 19 U/L (ref 15–41)
Albumin: 4.2 g/dL (ref 3.5–5.0)
Alkaline Phosphatase: 115 U/L (ref 38–126)
Anion gap: 6 (ref 5–15)
BUN: 13 mg/dL (ref 8–23)
CO2: 27 mmol/L (ref 22–32)
Calcium: 9.7 mg/dL (ref 8.9–10.3)
Chloride: 106 mmol/L (ref 98–111)
Creatinine: 0.77 mg/dL (ref 0.44–1.00)
GFR, Estimated: >60 mL/min (ref >60)
Glucose, Bld: 86 mg/dL (ref 70–99)
Potassium: 3.9 mmol/L (ref 3.5–5.1)
Sodium: 139 mmol/L (ref 135–145)
Total Bilirubin: 0.5 mg/dL (ref 0.3–1.2)
Total Protein: 6.7 g/dL (ref 6.5–8.1)

## 2023-01-22 LAB — CBC WITH DIFFERENTIAL (CANCER CENTER ONLY)
Abs Immature Granulocytes: 0.02 10*3/uL (ref 0.00–0.07)
Basophils Absolute: 0 10*3/uL (ref 0.0–0.1)
Basophils Relative: 1 %
Eosinophils Absolute: 0 10*3/uL (ref 0.0–0.5)
Eosinophils Relative: 0 %
HCT: 36.4 % (ref 36.0–46.0)
Hemoglobin: 12 g/dL (ref 12.0–15.0)
Immature Granulocytes: 0 %
Lymphocytes Relative: 8 %
Lymphs Abs: 0.4 10*3/uL — ABNORMAL LOW (ref 0.7–4.0)
MCH: 31.6 pg (ref 26.0–34.0)
MCHC: 33 g/dL (ref 30.0–36.0)
MCV: 95.8 fL (ref 80.0–100.0)
Monocytes Absolute: 0.4 10*3/uL (ref 0.1–1.0)
Monocytes Relative: 8 %
Neutro Abs: 4.4 10*3/uL (ref 1.7–7.7)
Neutrophils Relative %: 83 %
Platelet Count: 114 10*3/uL — ABNORMAL LOW (ref 150–400)
RBC: 3.8 MIL/uL — ABNORMAL LOW (ref 3.87–5.11)
RDW: 16.4 % — ABNORMAL HIGH (ref 11.5–15.5)
WBC Count: 5.3 10*3/uL (ref 4.0–10.5)
nRBC: 0 % (ref 0.0–0.2)

## 2023-01-22 MED ORDER — FAMOTIDINE IN NACL 20-0.9 MG/50ML-% IV SOLN
20.0000 mg | Freq: Once | INTRAVENOUS | Status: AC
  Administered 2023-01-22: 20 mg via INTRAVENOUS
  Filled 2023-01-22: qty 50

## 2023-01-22 MED ORDER — SODIUM CHLORIDE 0.9 % IV SOLN
375.0000 mg/m2 | Freq: Once | INTRAVENOUS | Status: AC
  Administered 2023-01-22: 700 mg via INTRAVENOUS
  Filled 2023-01-22: qty 20

## 2023-01-22 MED ORDER — PALONOSETRON HCL INJECTION 0.25 MG/5ML
0.2500 mg | Freq: Once | INTRAVENOUS | Status: AC
  Administered 2023-01-22: 0.25 mg via INTRAVENOUS
  Filled 2023-01-22: qty 5

## 2023-01-22 MED ORDER — SODIUM CHLORIDE 0.9% FLUSH
10.0000 mL | INTRAVENOUS | Status: DC | PRN
  Administered 2023-01-22: 10 mL

## 2023-01-22 MED ORDER — SODIUM CHLORIDE 0.9 % IV SOLN
10.0000 mg | Freq: Once | INTRAVENOUS | Status: AC
  Administered 2023-01-22: 10 mg via INTRAVENOUS
  Filled 2023-01-22: qty 10

## 2023-01-22 MED ORDER — SODIUM CHLORIDE 0.9 % IV SOLN
500.0000 mg/m2 | Freq: Once | INTRAVENOUS | Status: AC
  Administered 2023-01-22: 1000 mg via INTRAVENOUS
  Filled 2023-01-22: qty 50

## 2023-01-22 MED ORDER — SODIUM CHLORIDE 0.9 % IV SOLN: Freq: Once | INTRAVENOUS | Status: AC

## 2023-01-22 MED ORDER — VINCRISTINE SULFATE CHEMO INJECTION 1 MG/ML
1.0000 mg | Freq: Once | INTRAVENOUS | Status: AC
  Administered 2023-01-22: 1 mg via INTRAVENOUS
  Filled 2023-01-22: qty 1

## 2023-01-22 MED ORDER — DIPHENHYDRAMINE HCL 25 MG PO CAPS
50.0000 mg | ORAL_CAPSULE | Freq: Once | ORAL | Status: AC
  Administered 2023-01-22: 50 mg via ORAL
  Filled 2023-01-22: qty 2

## 2023-01-22 MED ORDER — SODIUM CHLORIDE 0.9% FLUSH
10.0000 mL | Freq: Once | INTRAVENOUS | Status: AC
  Administered 2023-01-22: 10 mL

## 2023-01-22 MED ORDER — ACETAMINOPHEN 325 MG PO TABS
650.0000 mg | ORAL_TABLET | Freq: Once | ORAL | Status: AC
  Administered 2023-01-22: 650 mg via ORAL
  Filled 2023-01-22: qty 2

## 2023-01-22 MED ORDER — HEPARIN SOD (PORK) LOCK FLUSH 100 UNIT/ML IV SOLN
500.0000 [IU] | Freq: Once | INTRAVENOUS | Status: AC | PRN
  Administered 2023-01-22: 500 [IU]

## 2023-01-22 MED ORDER — SODIUM CHLORIDE 0.9 % IV SOLN
50.0000 mg/m2 | Freq: Once | INTRAVENOUS | Status: AC
  Administered 2023-01-22: 100 mg via INTRAVENOUS
  Filled 2023-01-22: qty 5

## 2023-01-22 NOTE — Progress Notes (Signed)
Nutrition Follow-up:  Patient with large B-Cell lymphoma followed by Dr Candise Che.  Patient receiving R-CEOP.   Met with patient during infusion. Reports that her appetite is "good, too good."  Denies nutrition impact symptoms.  Usually gets up at 6am or so but does not like to eat until around 10am.  Usually has grits with cheese or fried egg sandwich on english muffin.  At 6pm has supper of salad with protein source (meat, egg).  Does not like milk and has not tried oral nutrition supplements.  Will eat ice cream.      Medications: reviewed  Labs: reviewed  Anthropometrics:   Weight 152 lb 9.6 oz today  156 lb on 6/3 158 lb on 5/13 172 lb on 08/12/22  Patient satisfied with weight loss.  "I am want my weight to decrease a little bit more."    NUTRITION DIAGNOSIS: Inadequate oral intake ongoing with weight loss   INTERVENTION:  Encouraged patient to try mixing oral nutrition supplement with ice cream or making a smoothie.  Discussed ensure clear (juice based shakes) as well. Recommend adding snack with protein source between brunch and supper.  Examples provided (fruit and yogurt or nuts or cheese).    MONITORING, EVALUATION, GOAL: weight trends, intake   NEXT VISIT: Monday, August 5 during infusion  Lenny Fiumara B. Freida Busman, RD, LDN Registered Dietitian [phone]

## 2023-01-22 NOTE — Progress Notes (Signed)
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 female who has been referred to [place] by Carylon Perches, DO for evaluation and management of non hodgkin's lymphoma. She has a hx of chronic systolic heart failure.  Nasolaryngoscopy revealed a mass lesion of the left oropharynx, emanating from the left base of tongue/glossotonsillar sulcus. Patient subsequently had a CT neck with contrast performed on 08/09/2022, which reported extensive bilateral adenopaty, with greatest lymph node conglomerate measuring 3.2 x 2.4 cm in the right level 2. Patient underwent core needle biopsy on 08/18/22 and endorsed persistent throat pain and odynophagia at the time. She is a former smoker and quit in the late 90s. SHe endorsed heavy tobacco use prior to quitting, with 1.5 pack/day for about 30 years.   Today, she is accompanied by her daughter. She complains of a sore throat beginning around Thanksgiving time. She was given antibiotics and prednisone which did not improve symptoms. She endorses swelling in her neck with associated soreness which came quickly past 2-3 weeks. She reports that her neck edema has recently grown on both the left and right sides. Her neck pain is in a specific area. She also complains of recent her neck pain if she turns her neck.  She did present Emergency EMT because something was stuck in her throat. Endoscopy revealed that it was food and it was pushed down. An ulcer was also found in her esophagus and some narrowing which will be stretched in three  months. She has not had any issues with swallowing food since then. She has not had a biopsy of issue on the back of her tongue. No other lumps/bumps, unexplained fevers, chills, night sweat, change in breathing, abdominal pain, or skin rashes. She reports discomfort with drinking tea which caused her to eat less as well.   She is allergic to Sumatriptan and another medication she cannot recall. She does not consume alcohol and has quit smoking in the 90s. 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 She believes the sore was caused by shoe discomfort.  She denies any facial puffiness, abdominal pain, recent change in bowel habits or urination. She reports that her defibrillator has never gone off.   She reports a ganglion cyst on her right knee, 2 knee replacements, and 2 hip replacements. She denies a Fhx of blood disorders. However, her sister has lung cancer and was a frequent smoker.  She is UTD with her vaccinations, incuding influenza, RSV, and COVID-19 booster. She has endorsed migraines since childhood. She lives on her own is able to complete daily activities independently. She will follow-up with Dr. Marene Lenz soon. She follows up with cardiologist regularly. Her a fib, pacemaker, and ICD device have been stable. She has not had a recent echo.   INTERVAL HISTORY:  ZIVAH BARNHILL is a 77 y.o. female who presents today for follow up and management of diffuse large B-cell lymphoma. She is here for cycle 5 day 1 of her treatment during this visit.   Patient was  last seen by me on 01/02/2023 and she was doing well overall.   Patient is accompanied by a family member during this visit. Patient notes she has been doing well overall since our last visit. She complains of bilateral leg swelling after her treatment and mild fatigue.   She denies any new infection issues, fever, chills, night sweats,  unexpected weight loss, appetite loss, chest pain, abdominal pain. She does complain of chronic back pain. She received steroid injection since our last visit, which slightly Improved her back pain.  Patient notes she tolerated cycle 4 of her treatment well without any new or severe toxicities.   Patient is planning to travel to [place] in November.    MEDICAL HISTORY:  Past Medical History:  Diagnosis Date   AICD (automatic cardioverter/defibrillator) present dual   [employer] ---  original placedment 2007/  generator change 2014 by dr gregg taylor   Anemia    Anticoagulant long-term use    eliquis   Anxiety    Arthralgia of multiple joints    Arthritis pain    Benign hypertensive heart disease    CAD (coronary artery disease) primary cardiologist-- dr gregg taylor   MI and 2 stents 1995 in [place]   Cancer [facility])    CHF NYHA class II, chronic, systolic (HCC)    followed by dr Sharlot Gowda taylor   Degenerative disc disease, lumbar    Depression    Dyslipidemia    History of basal cell carcinoma (BCC) excision    right ankle area s/p  excision in office 06/ 2019  in office   History of DVT of lower extremity yrs ago before 2012   History of MI (myocardial infarction) 1995  in [place]   History of pulmonary embolus (PE) 2012   History of ventricular tachycardia    Hyperlipidemia    Hypersomnia    Hypertension    Ischemic cardiomyopathy    Migraines    Myocardial infarction (HCC)    OA (osteoarthritis)    "all over"   Obstructive apnea    PAF (paroxysmal atrial fibrillation) (HCC)    Primary localized osteoarthritis of right hip 12/18/2017   Primary localized osteoarthritis of right knee 05/28/2018   S/P coronary artery stent placement 1995   in [facility]   05-21-2018 per pt x2  stents in same coronary artery (unsure BM or DES)   Vitamin D deficiency disease     SURGICAL HISTORY: Past Surgical History:  Procedure Laterality Date   BIOPSY  06/19/2022   Procedure: BIOPSY;  Surgeon:  Kathi Der, MD;  Location: [facility];  Service: Gastroenterology;;   CARDIAC DEFIBRILLATOR PLACEMENT  2007   CATARACT EXTRACTION, BILATERAL     COLONOSCOPY  04/14/2013   colonic polyp, status post polypectomy. Mild panocolonic diverticulosis. Small internal hemorrhoids   DILATION AND CURETTAGE OF UTERUS  yrs ago   DIRECT LARYNGOSCOPY Right 10/04/2022   Procedure: DIRECT LARYNGOSCOPY;  Surgeon: Cheron Schaumann A, DO;  Location: [facility];  Service: ENT;  Laterality: Right;   ESOPHAGOGASTRODUODENOSCOPY (EGD) WITH PROPOFOL N/A 06/19/2022   Procedure: ESOPHAGOGASTRODUODENOSCOPY (EGD) WITH PROPOFOL;  Surgeon: Kathi Der, MD;  Location: [facility];  Service: Gastroenterology;  Laterality: N/A;   IMPACTION REMOVAL  06/19/2022   Procedure: IMPACTION REMOVAL;  Surgeon: Kathi Der, MD;  Location: [facility];  Service: Gastroenterology;;   IMPLANTABLE CARDIOVERTER DEFIBRILLATOR GENERATOR CHANGE N/A 02/04/2013   Procedure: IMPLANTABLE CARDIOVERTER DEFIBRILLATOR GENERATOR CHANGE;  Surgeon: Marinus Maw, MD;  Location: [facility] CATH LAB;  Service: Cardiovascular;  Laterality: N/A;   IR IMAGING GUIDED PORT INSERTION  10/27/2022   LYMPH NODE BIOPSY Right 10/04/2022   Procedure: EXCISIONAL OF RIGHT DEEP CERVICAL LYMPH NODE;  Surgeon: Laren Boom, DO;  Location: [facility];  Service: ENT;  Laterality: Right;   SHOULDER ARTHROSCOPY Right 2015   TOTAL HIP ARTHROPLASTY Right 12/18/2017   Procedure: RIGHT TOTAL HIP ARTHROPLASTY;  Surgeon: Teryl Lucy, MD;  Location: [facility] OR;  Service: Orthopedics;  Laterality: Right;   TOTAL HIP ARTHROPLASTY Left 04/19/2021   Procedure: TOTAL HIP ARTHROPLASTY;  Surgeon: Teryl Lucy, MD;  Location: [facility];  Service: Orthopedics;  Laterality: Left;   TOTAL KNEE ARTHROPLASTY Right 05/28/2018   Procedure: TOTAL KNEE ARTHROPLASTY;  Surgeon: Teryl Lucy, MD;  Location: [facility];  Service: Orthopedics;  Laterality: Right;  Adductor Block   TOTAL KNEE ARTHROPLASTY  Left 08/12/2021   Procedure: TOTAL KNEE ARTHROPLASTY;  Surgeon: Teryl Lucy, MD;  Location: [facility];  Service: Orthopedics;  Laterality: Left;   TUBAL LIGATION Bilateral yrs ago   WISDOM TOOTH EXTRACTION      SOCIAL HISTORY: Social History   Socioeconomic History   Marital status: Single    Spouse name: Not on file   Number of children: Not on file   Years of education: Not on file   Highest education level: Not on file  Occupational History   Not on file  Tobacco Use   Smoking status: Former    Current packs/day: 0.00    Types: Cigarettes    Start date: 05/21/1966    Quit date: 05/21/1996    Years since quitting: 26.6   Smokeless tobacco: Never  Vaping Use   Vaping status: Never Used  Substance and Sexual Activity   Alcohol use: No   Drug use: Never   Sexual activity: Not on file  Other Topics Concern   Not on file  Social History Narrative   Not on file   Social Determinants of Health   Financial Resource Strain: Low Risk  (05/28/2018)   Overall Financial Resource Strain (CARDIA)    Difficulty of Paying Living Expenses: Not hard at all  Food Insecurity: No Food Insecurity (05/28/2018)   Hunger Vital Sign    Worried About Running Out of Food in the Last Year: Never true    Ran Out of Food in the Last Year: Never true  Transportation Needs: No Transportation Needs (05/28/2018)   PRAPARE - Administrator, Civil Service (Medical): No    Lack of Transportation (Non-Medical): No  Physical Activity: Not on file  Stress: Not on file  Social Connections: Not on file  Intimate Partner Violence: Not on file    FAMILY HISTORY: Family History  Problem Relation Age of Onset   Hypertension Mother    Thyroid disease Mother    Alzheimer's disease Mother    Coronary artery disease Father    Pulmonary embolism Father    Congestive Heart Failure Maternal Grandmother    Hypertension Maternal Grandmother    Heart attack Maternal Grandfather    Other Maternal  Grandfather        carotid disease   Dementia Paternal Grandmother    Other Paternal Grandfather 29       accident    ALLERGIES:  is allergic to sumatriptan succinate and amitriptyline.  MEDICATIONS:  Current Outpatient Medications  Medication Sig Dispense Refill   acetaminophen (TYLENOL) 650 MG CR tablet Take 1,300 mg by mouth 3 (three) times daily.     acyclovir (ZOVIRAX) 400  MG tablet TAKE 1 TABLET BY MOUTH TWICE A DAY 180 tablet 2   allopurinol (ZYLOPRIM) 300 MG tablet TAKE 0.5 TABLETS BY MOUTH DAILY. 45 tablet 1   apixaban (ELIQUIS) 5 MG TABS tablet TAKE 1 TABLET BY MOUTH TWICE A DAY 60 tablet 5   b complex vitamins capsule Take 1 capsule by mouth daily.     Calcium Carb-Cholecalciferol (CALCIUM 600 + D PO) Take 1 tablet by mouth daily.     carvedilol (COREG) 12.5 MG tablet Take 12.5 mg by mouth 2 (two) times daily with a meal.      Cholecalciferol (VITAMIN D3) 125 MCG (5000 UT) capsule Take 5,000 Units by mouth daily.     cyanocobalamin (VITAMIN B12) 1000 MCG tablet Take 1,000 mcg by mouth daily.     FIBER PO Take 1 capsule by mouth 2 (two) times daily.     Fiber POWD Take 1 Scoop by mouth daily as needed (constipation).     furosemide (LASIX) 40 MG tablet Take 40 mg by mouth daily as needed for edema.     gabapentin (NEURONTIN) 300 MG capsule Take 300 mg by mouth 3 (three) times daily.     HYDROcodone-acetaminophen (NORCO/VICODIN) 5-325 MG tablet Take 1 tablet by mouth every 6 (six) hours as needed for moderate pain.     lidocaine-prilocaine (EMLA) cream Apply to affected area once 30 g 3   loratadine (CLARITIN) 10 MG tablet Take 10 mg by mouth daily.     Multiple Vitamin (STRESS FORMULA) TABS Take 1 tablet by mouth daily.     Omega-3 Fatty Acids (FISH OIL) 1000 MG CAPS Take 1,000 mg by mouth daily.      ondansetron (ZOFRAN) 8 MG tablet Take 1 tablet (8 mg total) by mouth every 8 (eight) hours as needed for nausea or vomiting. Start on day 3 after cyclophosphamide chemotherapy.  30 tablet 1   pantoprazole (PROTONIX) 40 MG tablet Take 1 tablet (40 mg total) by mouth 2 (two) times daily. 180 tablet 3   predniSONE (DELTASONE) 20 MG tablet Take 3 tablets (60 mg total) by mouth daily with breakfast. Take for 5 days now x 1 course and then for 5 days after each cycle of chemotherapy 15 tablet 6   prochlorperazine (COMPAZINE) 10 MG tablet Take 1 tablet (10 mg total) by mouth every 6 (six) hours as needed for nausea or vomiting. 30 tablet 6   ramipril (ALTACE) 2.5 MG tablet Take 2.5 mg by mouth daily.       simvastatin (ZOCOR) 40 MG tablet Take 40 mg by mouth at bedtime.       traMADol (ULTRAM) 50 MG tablet Take 1 tablet (50 mg total) by mouth every 12 (twelve) hours as needed. 30 tablet 0   traZODone (DESYREL) 50 MG tablet Take 50 mg by mouth at bedtime.      venlafaxine (EFFEXOR) 75 MG tablet Take 75 mg by mouth daily.     vitamin E 400 UNIT capsule Take 400 Units by mouth daily.     zonisamide (ZONEGRAN) 50 MG capsule Take 50-100 mg by mouth See admin instructions. Take 50mg  by mouth in the morning and 100mg  at night.     No current facility-administered medications for this visit.    REVIEW OF SYSTEMS:    10 Point review of Systems was done is negative except as noted above.   PHYSICAL EXAMINATION: ECOG PERFORMANCE STATUS: 1 - Symptomatic but completely ambulatory .BP 118/62   Pulse 98   Temp (!)  97.5 F (36.4 C)   Resp 18   Wt 152 lb 9.6 oz (69.2 kg)   SpO2 97%   BMI 23.90 kg/m   GENERAL:alert, in no acute distress and comfortable SKIN: no acute rashes, no significant lesions EYES: conjunctiva are pink and non-injected, sclera anicteric OROPHARYNX: MMM, no exudates, no oropharyngeal erythema or ulceration NECK: supple, no JVD LYMPH:  no palpable lymphadenopathy in the cervical, axillary or inguinal regions LUNGS: clear to auscultation b/l with normal respiratory effort HEART: regular rate & rhythm ABDOMEN:  normoactive bowel sounds , non tender, not  distended. Extremity: no pedal edema PSYCH: alert & oriented x 3 with fluent speech NEURO: no focal motor/sensory deficits   LABORATORY DATA:  I have reviewed the data as listed    Latest Ref Rng & Units 01/22/2023   10:11 AM 01/02/2023    9:52 AM 12/11/2022    9:02 AM  CBC  WBC 4.0 - 10.5 K/uL 5.3  3.8  3.9   Hemoglobin 12.0 - 15.0 g/dL 53.6  64.4  03.4   Hematocrit 36.0 - 46.0 % 36.4  35.0  36.6   Platelets 150 - 400 K/uL 114  102  100       Latest Ref Rng & Units 01/22/2023   10:11 AM 01/02/2023    9:52 AM 12/11/2022    9:02 AM  CMP  Glucose 70 - 99 mg/dL 86  97  98   BUN 8 - 23 mg/dL 13  14  17    Creatinine 0.44 - 1.00 mg/dL 7.42  5.95  6.38   Sodium 135 - 145 mmol/L 139  140  139   Potassium 3.5 - 5.1 mmol/L 3.9  4.2  3.8   Chloride 98 - 111 mmol/L 106  107  108   CO2 22 - 32 mmol/L 27  27  25    Calcium 8.9 - 10.3 mg/dL 9.7  9.4  9.0   Total Protein 6.5 - 8.1 g/dL 6.7  6.7  6.4   Total Bilirubin 0.3 - 1.2 mg/dL 0.5  0.6  0.5   Alkaline Phos 38 - 126 U/L 115  88  103   AST 15 - 41 U/L 19  20  18    ALT 0 - 44 U/L 19  16  15     Lab Results  Component Value Date   LDH 239 (H) 11/15/2022   Molecular Pathology 10/21/2022:    Surgical Pathology 10/04/22: A. LYMPH NODE, RIGHT LEVEL 2 DEEP CERVICAL, EXCISION:  -Diffuse large B-cell lymphoma  -See comment COMMENT: The sections show diffuse effacement of the lymph nodal architecture primarily by a population of large lymphoid cells characterized by vesicular chromatin and small nucleoli associated with apoptosis and brisk mitosis.  In some areas, the large atypical lymphoid proliferation extends into the perinodal adipose tissue.  In this background, there are scattered variably sized and somewhat disrupted aggregates of primarily small lymphoid cells characterized by high nuclear cytoplasmic ratio, angulated nuclear contours and small to inconspicuous nucleoli. Flow cytometric analysis was performed [facility] [id number]) and shows  a monoclonal, lambda restricted B-cell population expressing CD10.  In addition, immunohistochemical stains for CD3, CD5, CD10, CD20, PAX5, BCL6, Bcl-2, Ki-67, CD30, CD138, CD21, EBV in addition to in situ hybridization for kappa and lambda were performed with appropriate controls.  The large lymphoid cells are positive for CD20, PAX5, CD10, BCL6, Bcl-2, and partially for cytoplasmic lambda.  No significant staining is seen with EBV, CD30, CD138 or cytoplasmic kappa.  Ki67 shows variably  increased expression (more than 50% in some areas). CD21 highlights scattered somewhat disrupted follicular dendritic networks. The lymphoid aggregates of primarily small lymphoid cells previously described show positivity for B-cell markers CD20 and PAX5 in addition to CD10 and Bcl-2.  There is an admixed variable T-cell component in the background as seen with CD3 and CD5 and there is no apparent co-expression of CD5 in B-cell areas.  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   RADIOGRAPHIC STUDIES: I have personally reviewed the radiological images as listed and agreed with the findings in the report. CUP PACEART REMOTE DEVICE CHECK  Result Date: 01/05/2023 Monthly battery check.  Estimated 48mo Normal device function. 1 NSVT, no therapy Follow up as scheduled monthly LA, CVRS  NM PET Image Restag (PS) Skull Base To Thigh  Result Date: 12/29/2022 CLINICAL DATA:  Subsequent treatment strategy for diffuse large B-cell lymphoma. EXAM: NUCLEAR MEDICINE PET SKULL BASE TO THIGH TECHNIQUE: 7.8 mCi F-18 FDG was injected intravenously. Full-ring PET imaging was performed from the skull base to thigh after the radiotracer. CT data was obtained and used for attenuation correction and anatomic localization. Fasting blood glucose: 91 mg/dl COMPARISON:  PET-CT dated 09/04/2022  FINDINGS: Mediastinal blood pool activity: SUV max 2.4 Liver activity: SUV max 3.4 NECK: Prior hypermetabolic base of tongue/peritonsillar lesion has resolved. Prior hypermetabolic bilateral cervical lymphadenopathy has resolved. Incidental CT findings: None. CHEST: No hypermetabolic thoracic lymphadenopathy. Prior medial left upper lobe nodule has resolved. However, there is a 5 mm central right upper lobe nodule (series 7/image 25), max SUV 1.9, previously 4 mm. This is considered indeterminate. Right chest port terminates the cavoatrial junction. Incidental CT findings: Atherosclerotic calcifications of the aortic arch. Moderate coronary atherosclerosis of the left circumflex. Pericardial calcifications at the left ventricular apex. Left subclavian ICD. ABDOMEN/PELVIS: No abnormal hypermetabolism in the liver, pancreas, or adrenal glands. Spleen is within normal limits for size, measuring 12.0 cm in maximal craniocaudal dimension. No focal lesion is seen. Reference max SUV 3.0. No hypermetabolic abdominopelvic lymphadenopathy. Prior jejunal mesenteric stranding with subcentimeter lymph nodes has essentially resolved. Incidental CT findings: Mild sigmoid diverticulosis, without evidence of diverticulitis. Atherosclerotic calcifications abdominal aorta and branch vessels. SKELETON: No focal hypermetabolic activity to suggest skeletal metastasis. Incidental CT findings: Degenerative changes of the lumbar spine. Bilateral hip arthroplasties. Healing right inferior pubic ramus fracture (series 4/image 136), new from the prior. Mild sclerosis along the right sacrum (series 4/image 142) is new from the prior and suggests a possible sacral insufficiency fracture. IMPRESSION: Complete/near-complete metabolic response, without residual lymphadenopathy or focal lesion. Deauville criteria 1. 5 mm central right upper lobe nodule, without convincing hypermetabolism. Attention on follow-up is suggested. Spleen is within normal  limits for size, without focal lesion. Healing right inferior pubic ramus fracture, new from the prior. Suspected interval right sacral insufficiency fracture. Electronically Signed   By: Charline Bills M.D.   On: 12/29/2022 23:52    NM PET 09/04/2022: IMPRESSION: 1. There is a large tracer avid mass centered within the left base of tongue and left lingual region which extends into the hypopharyngeal region ventral to the epiglottis. Imaging findings are compatible with a primary head and neck malignancy. 2. A smaller focus of increased uptake localizes to the right pharyngeal tonsil region. Indeterminate. 3. Bilateral tracer avid cervical and supraclavicular lymph nodes compatible with nodal metastasis. 4. There is a 7 mm tracer avid  nodule within the anteromedial left upper lobe which is suspicious for pulmonary metastasis. 5. There is increased uptake identified along the long axis of the posterior right seventh rib. No corresponding lytic or sclerotic changes identified on the CT images. Cannot exclude bone metastases. 6. Multiple prominent retroperitoneal and mesenteric lymph nodes are identified which exhibit mild tracer uptake. This is a nonspecific finding and may reflect reactive adenopathy. Metastatic adenopathy cannot be excluded. Attention on future surveillance imaging is advised. 7.  Aortic Atherosclerosis (ICD10-I70.0).   ASSESSMENT & PLAN:   77 y.o.  female with:   1. 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 2. 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 CBC shows slightly decreased platelets at 114 K, but improved from 102 K. CMP is  stable.   -Patient tolerated her last treatment well without any new or severe toxicities.  -Patient can proceed with cycle 5 of her treatment today as planned without any dose modifications.  -Plan on PET scan after cycle 6 of her treatment.  - treatment ordered reviewed and signed. FOLLOW-UP: -Please schedule cycle 6 of R CEOP as per orders with port flush, labs and appointment with Karena Addison in 3 weeks -PET/CT in 5 weeks -Return to clinic with Dr Candise Che with. Port flush and labs in 6 weeks  The total time spent in the appointment was 32 minutes* .  All of the patient's questions were answered with apparent satisfaction. The patient knows to call the clinic with any problems, questions or concerns.   Wyvonnia Lora MD MS AAHIVMS [facility] Hematology/Oncology Physician [facility]  .*Total Encounter Time as defined by the Centers for Medicare and Medicaid Services includes, in addition to the face-to-face time of a patient visit (documented in the note above) non-face-to-face time: obtaining and reviewing outside history, ordering and reviewing medications, tests or procedures, care coordination (communications with other health care professionals or caregivers) and documentation in the medical record.   I, Ok Edwards, acting as a Neurosurgeon for Wyvonnia Lora, MD.,have documented all relevant documentation on the behalf of Wyvonnia Lora, MD,as directed by  Wyvonnia Lora, MD while in the presence of Wyvonnia Lora, MD.  .I have reviewed the above documentation for accuracy and completeness, and I agree with the above. Johney Maine MD

## 2023-01-22 NOTE — Progress Notes (Signed)
Per Dr. Candise Che, OK to proceed with current dosing regimen.   Jerry Caras, PharmD PGY2 Oncology Pharmacy Resident   01/22/2023 12:27 PM

## 2023-01-22 NOTE — Patient Instructions (Signed)
[facility]  Discharge Instructions: Thank you for choosing [facility] to provide your oncology and hematology care.   If you have a lab appointment with the Cancer Center, please go directly to the Cancer Center and check in at the registration area.   Wear comfortable clothing and clothing appropriate for easy access to any Portacath or PICC line.   We strive to give you quality time with your provider. You may need to reschedule your appointment if you arrive late (15 or more minutes).  Arriving late affects you and other patients whose appointments are after yours.  Also, if you miss three or more appointments without notifying the office, you may be dismissed from the clinic at the provider's discretion.      For prescription refill requests, have your pharmacy contact our office and allow 72 hours for refills to be completed.    Today you received the following chemotherapy and/or immunotherapy agents Vincristine, Cytoxan, Etoposide, Rituxan      To help prevent nausea and vomiting after your treatment, we encourage you to take your nausea medication as directed.  BELOW ARE SYMPTOMS THAT SHOULD BE REPORTED IMMEDIATELY: *FEVER GREATER THAN 100.4 F (38 C) OR HIGHER *CHILLS OR SWEATING *NAUSEA AND VOMITING THAT IS NOT CONTROLLED WITH YOUR NAUSEA MEDICATION *UNUSUAL SHORTNESS OF BREATH *UNUSUAL BRUISING OR BLEEDING *URINARY PROBLEMS (pain or burning when urinating, or frequent urination) *BOWEL PROBLEMS (unusual diarrhea, constipation, pain near the anus) TENDERNESS IN MOUTH AND THROAT WITH OR WITHOUT PRESENCE OF ULCERS (sore throat, sores in mouth, or a toothache) UNUSUAL RASH, SWELLING OR PAIN  UNUSUAL VAGINAL DISCHARGE OR ITCHING   Items with * indicate a potential emergency and should be followed up as soon as possible or go to the Emergency Department if any problems should occur.  Please show the CHEMOTHERAPY ALERT CARD or  IMMUNOTHERAPY ALERT CARD at check-in to the Emergency Department and triage nurse.  Should you have questions after your visit or need to cancel or reschedule your appointment, please contact [facility] [facility]  Dept: [phone]  and follow the prompts.  Office hours are 8:00 a.m. to 4:30 p.m. Monday - Friday. Please note that voicemails left after 4:00 p.m. may not be returned until the following business day.  We are closed weekends and major holidays. You have access to a nurse at all times for urgent questions. Please call the main number to the clinic Dept: [phone] and follow the prompts.   For any non-urgent questions, you may also contact your provider using MyChart. We now offer e-Visits for anyone 10 and older to request care online for non-urgent symptoms. For details visit [email or web address].   Also download the MyChart app! Go to the app store, search "MyChart", open the app, select [place], and log in with your MyChart username and password.

## 2023-01-23 ENCOUNTER — Inpatient Hospital Stay: Payer: Medicare HMO

## 2023-01-23 ENCOUNTER — Other Ambulatory Visit: Payer: Self-pay

## 2023-01-23 VITALS — BP 132/65 | HR 96 | Temp 98.8°F | Resp 18

## 2023-01-23 DIAGNOSIS — C8338 Diffuse large B-cell lymphoma, lymph nodes of multiple sites: Secondary | ICD-10-CM

## 2023-01-23 DIAGNOSIS — Z5111 Encounter for antineoplastic chemotherapy: Secondary | ICD-10-CM | POA: Diagnosis not present

## 2023-01-23 DIAGNOSIS — Z5112 Encounter for antineoplastic immunotherapy: Secondary | ICD-10-CM | POA: Diagnosis not present

## 2023-01-23 DIAGNOSIS — Z7189 Other specified counseling: Secondary | ICD-10-CM

## 2023-01-23 DIAGNOSIS — C8331 Diffuse large B-cell lymphoma, lymph nodes of head, face, and neck: Secondary | ICD-10-CM | POA: Diagnosis not present

## 2023-01-23 DIAGNOSIS — Z79899 Other long term (current) drug therapy: Secondary | ICD-10-CM | POA: Diagnosis not present

## 2023-01-23 DIAGNOSIS — Z5189 Encounter for other specified aftercare: Secondary | ICD-10-CM | POA: Diagnosis not present

## 2023-01-23 MED ORDER — SODIUM CHLORIDE 0.9 % IV SOLN: Freq: Once | INTRAVENOUS | Status: AC

## 2023-01-23 MED ORDER — HEPARIN SOD (PORK) LOCK FLUSH 100 UNIT/ML IV SOLN
500.0000 [IU] | Freq: Once | INTRAVENOUS | Status: AC | PRN
  Administered 2023-01-23: 500 [IU]

## 2023-01-23 MED ORDER — PROCHLORPERAZINE MALEATE 10 MG PO TABS
10.0000 mg | ORAL_TABLET | Freq: Once | ORAL | Status: AC
  Administered 2023-01-23: 10 mg via ORAL
  Filled 2023-01-23: qty 1

## 2023-01-23 MED ORDER — SODIUM CHLORIDE 0.9 % IV SOLN
50.0000 mg/m2 | Freq: Once | INTRAVENOUS | Status: AC
  Administered 2023-01-23: 100 mg via INTRAVENOUS
  Filled 2023-01-23: qty 5

## 2023-01-23 MED ORDER — SODIUM CHLORIDE 0.9% FLUSH
10.0000 mL | INTRAVENOUS | Status: DC | PRN
  Administered 2023-01-23: 10 mL

## 2023-01-23 NOTE — Patient Instructions (Signed)
[facility] [facility]  Discharge Instructions: Thank you for choosing [facility] to provide your oncology and hematology care.   If you have a lab appointment with the Cancer Center, please go directly to the Cancer Center and check in at the registration area.   Wear comfortable clothing and clothing appropriate for easy access to any Portacath or PICC line.   We strive to give you quality time with your provider. You may need to reschedule your appointment if you arrive late (15 or more minutes).  Arriving late affects you and other patients whose appointments are after yours.  Also, if you miss three or more appointments without notifying the office, you may be dismissed from the clinic at the provider's discretion.      For prescription refill requests, have your pharmacy contact our office and allow 72 hours for refills to be completed.    Today you received the following chemotherapy and/or immunotherapy agents; Etoposide      To help prevent nausea and vomiting after your treatment, we encourage you to take your nausea medication as directed.  BELOW ARE SYMPTOMS THAT SHOULD BE REPORTED IMMEDIATELY: *FEVER GREATER THAN 100.4 F (38 C) OR HIGHER *CHILLS OR SWEATING *NAUSEA AND VOMITING THAT IS NOT CONTROLLED WITH YOUR NAUSEA MEDICATION *UNUSUAL SHORTNESS OF BREATH *UNUSUAL BRUISING OR BLEEDING *URINARY PROBLEMS (pain or burning when urinating, or frequent urination) *BOWEL PROBLEMS (unusual diarrhea, constipation, pain near the anus) TENDERNESS IN MOUTH AND THROAT WITH OR WITHOUT PRESENCE OF ULCERS (sore throat, sores in mouth, or a toothache) UNUSUAL RASH, SWELLING OR PAIN  UNUSUAL VAGINAL DISCHARGE OR ITCHING   Items with * indicate a potential emergency and should be followed up as soon as possible or go to the Emergency Department if any problems should occur.  Please show the CHEMOTHERAPY ALERT CARD or IMMUNOTHERAPY ALERT CARD at  check-in to the Emergency Department and triage nurse.  Should you have questions after your visit or need to cancel or reschedule your appointment, please contact [facility]  Dept: [phone]  and follow the prompts.  Office hours are 8:00 a.m. to 4:30 p.m. Monday - Friday. Please note that voicemails left after 4:00 p.m. may not be returned until the following business day.  We are closed weekends and major holidays. You have access to a nurse at all times for urgent questions. Please call the main number to the clinic Dept: [phone] and follow the prompts.   For any non-urgent questions, you may also contact your provider using MyChart. We now offer e-Visits for anyone 18 and older to request care online for non-urgent symptoms. For details visit [email or web address].   Also download the MyChart app! Go to the app store, search "MyChart", open the app, select [place], and log in with your MyChart username and password.   

## 2023-01-24 ENCOUNTER — Inpatient Hospital Stay: Payer: Medicare HMO

## 2023-01-24 VITALS — BP 130/56 | HR 72 | Temp 98.4°F | Resp 18

## 2023-01-24 DIAGNOSIS — C8338 Diffuse large B-cell lymphoma, lymph nodes of multiple sites: Secondary | ICD-10-CM

## 2023-01-24 DIAGNOSIS — Z79899 Other long term (current) drug therapy: Secondary | ICD-10-CM | POA: Diagnosis not present

## 2023-01-24 DIAGNOSIS — Z5189 Encounter for other specified aftercare: Secondary | ICD-10-CM | POA: Diagnosis not present

## 2023-01-24 DIAGNOSIS — C8331 Diffuse large B-cell lymphoma, lymph nodes of head, face, and neck: Secondary | ICD-10-CM | POA: Diagnosis not present

## 2023-01-24 DIAGNOSIS — Z5112 Encounter for antineoplastic immunotherapy: Secondary | ICD-10-CM | POA: Diagnosis not present

## 2023-01-24 DIAGNOSIS — Z7189 Other specified counseling: Secondary | ICD-10-CM

## 2023-01-24 DIAGNOSIS — Z5111 Encounter for antineoplastic chemotherapy: Secondary | ICD-10-CM | POA: Diagnosis not present

## 2023-01-24 MED ORDER — PROCHLORPERAZINE MALEATE 10 MG PO TABS
10.0000 mg | ORAL_TABLET | Freq: Once | ORAL | Status: AC
  Administered 2023-01-24: 10 mg via ORAL
  Filled 2023-01-24: qty 1

## 2023-01-24 MED ORDER — SODIUM CHLORIDE 0.9 % IV SOLN: Freq: Once | INTRAVENOUS | Status: AC

## 2023-01-24 MED ORDER — SODIUM CHLORIDE 0.9% FLUSH
10.0000 mL | INTRAVENOUS | Status: DC | PRN
  Administered 2023-01-24: 10 mL

## 2023-01-24 MED ORDER — HEPARIN SOD (PORK) LOCK FLUSH 100 UNIT/ML IV SOLN
500.0000 [IU] | Freq: Once | INTRAVENOUS | Status: AC | PRN
  Administered 2023-01-24: 500 [IU]

## 2023-01-24 MED ORDER — SODIUM CHLORIDE 0.9 % IV SOLN
50.0000 mg/m2 | Freq: Once | INTRAVENOUS | Status: AC
  Administered 2023-01-24: 100 mg via INTRAVENOUS
  Filled 2023-01-24: qty 5

## 2023-01-24 NOTE — Progress Notes (Signed)
Continuing with current dosing regimen. Etoposide 100 mg.   Demetrius Charity, PharmD

## 2023-01-24 NOTE — Patient Instructions (Signed)
[facility] [facility]  Discharge Instructions: Thank you for choosing [facility] to provide your oncology and hematology care.   If you have a lab appointment with the Cancer Center, please go directly to the Cancer Center and check in at the registration area.   Wear comfortable clothing and clothing appropriate for easy access to any Portacath or PICC line.   We strive to give you quality time with your provider. You may need to reschedule your appointment if you arrive late (15 or more minutes).  Arriving late affects you and other patients whose appointments are after yours.  Also, if you miss three or more appointments without notifying the office, you may be dismissed from the clinic at the provider's discretion.      For prescription refill requests, have your pharmacy contact our office and allow 72 hours for refills to be completed.    Today you received the following chemotherapy and/or immunotherapy agents; Etoposide      To help prevent nausea and vomiting after your treatment, we encourage you to take your nausea medication as directed.  BELOW ARE SYMPTOMS THAT SHOULD BE REPORTED IMMEDIATELY: *FEVER GREATER THAN 100.4 F (38 C) OR HIGHER *CHILLS OR SWEATING *NAUSEA AND VOMITING THAT IS NOT CONTROLLED WITH YOUR NAUSEA MEDICATION *UNUSUAL SHORTNESS OF BREATH *UNUSUAL BRUISING OR BLEEDING *URINARY PROBLEMS (pain or burning when urinating, or frequent urination) *BOWEL PROBLEMS (unusual diarrhea, constipation, pain near the anus) TENDERNESS IN MOUTH AND THROAT WITH OR WITHOUT PRESENCE OF ULCERS (sore throat, sores in mouth, or a toothache) UNUSUAL RASH, SWELLING OR PAIN  UNUSUAL VAGINAL DISCHARGE OR ITCHING   Items with * indicate a potential emergency and should be followed up as soon as possible or go to the Emergency Department if any problems should occur.  Please show the CHEMOTHERAPY ALERT CARD or IMMUNOTHERAPY ALERT CARD at  check-in to the Emergency Department and triage nurse.  Should you have questions after your visit or need to cancel or reschedule your appointment, please contact [facility]  Dept: [phone]  and follow the prompts.  Office hours are 8:00 a.m. to 4:30 p.m. Monday - Friday. Please note that voicemails left after 4:00 p.m. may not be returned until the following business day.  We are closed weekends and major holidays. You have access to a nurse at all times for urgent questions. Please call the main number to the clinic Dept: [phone] and follow the prompts.   For any non-urgent questions, you may also contact your provider using MyChart. We now offer e-Visits for anyone 18 and older to request care online for non-urgent symptoms. For details visit [email or web address].   Also download the MyChart app! Go to the app store, search "MyChart", open the app, select [place], and log in with your MyChart username and password.   

## 2023-01-26 ENCOUNTER — Inpatient Hospital Stay: Payer: Medicare HMO

## 2023-01-26 VITALS — BP 127/66 | HR 86 | Temp 98.3°F | Resp 17

## 2023-01-26 DIAGNOSIS — Z5112 Encounter for antineoplastic immunotherapy: Secondary | ICD-10-CM | POA: Diagnosis not present

## 2023-01-26 DIAGNOSIS — Z5111 Encounter for antineoplastic chemotherapy: Secondary | ICD-10-CM | POA: Diagnosis not present

## 2023-01-26 DIAGNOSIS — Z79899 Other long term (current) drug therapy: Secondary | ICD-10-CM | POA: Diagnosis not present

## 2023-01-26 DIAGNOSIS — Z7189 Other specified counseling: Secondary | ICD-10-CM

## 2023-01-26 DIAGNOSIS — C8338 Diffuse large B-cell lymphoma, lymph nodes of multiple sites: Secondary | ICD-10-CM

## 2023-01-26 DIAGNOSIS — Z5189 Encounter for other specified aftercare: Secondary | ICD-10-CM | POA: Diagnosis not present

## 2023-01-26 DIAGNOSIS — C8331 Diffuse large B-cell lymphoma, lymph nodes of head, face, and neck: Secondary | ICD-10-CM | POA: Diagnosis not present

## 2023-01-26 MED ORDER — PEGFILGRASTIM-CBQV 6 MG/0.6ML ~~LOC~~ SOSY
6.0000 mg | PREFILLED_SYRINGE | Freq: Once | SUBCUTANEOUS | Status: AC
  Administered 2023-01-26: 6 mg via SUBCUTANEOUS

## 2023-01-29 DIAGNOSIS — J029 Acute pharyngitis, unspecified: Secondary | ICD-10-CM | POA: Diagnosis not present

## 2023-01-29 DIAGNOSIS — Z6823 Body mass index (BMI) 23.0-23.9, adult: Secondary | ICD-10-CM | POA: Diagnosis not present

## 2023-01-29 DIAGNOSIS — C8590 Non-Hodgkin lymphoma, unspecified, unspecified site: Secondary | ICD-10-CM | POA: Diagnosis not present

## 2023-01-29 DIAGNOSIS — I509 Heart failure, unspecified: Secondary | ICD-10-CM | POA: Diagnosis not present

## 2023-01-30 ENCOUNTER — Encounter: Payer: Self-pay | Admitting: Hematology

## 2023-02-02 DIAGNOSIS — G629 Polyneuropathy, unspecified: Secondary | ICD-10-CM | POA: Diagnosis not present

## 2023-02-02 DIAGNOSIS — Z79899 Other long term (current) drug therapy: Secondary | ICD-10-CM | POA: Diagnosis not present

## 2023-02-02 DIAGNOSIS — R739 Hyperglycemia, unspecified: Secondary | ICD-10-CM | POA: Diagnosis not present

## 2023-02-02 DIAGNOSIS — Z6824 Body mass index (BMI) 24.0-24.9, adult: Secondary | ICD-10-CM | POA: Diagnosis not present

## 2023-02-02 DIAGNOSIS — F339 Major depressive disorder, recurrent, unspecified: Secondary | ICD-10-CM | POA: Diagnosis not present

## 2023-02-02 DIAGNOSIS — E782 Mixed hyperlipidemia: Secondary | ICD-10-CM | POA: Diagnosis not present

## 2023-02-05 ENCOUNTER — Ambulatory Visit (INDEPENDENT_AMBULATORY_CARE_PROVIDER_SITE_OTHER): Payer: Medicare HMO

## 2023-02-05 DIAGNOSIS — I472 Ventricular tachycardia, unspecified: Secondary | ICD-10-CM

## 2023-02-05 DIAGNOSIS — M5416 Radiculopathy, lumbar region: Secondary | ICD-10-CM | POA: Diagnosis not present

## 2023-02-05 DIAGNOSIS — I4729 Other ventricular tachycardia: Secondary | ICD-10-CM

## 2023-02-09 MED FILL — Dexamethasone Sodium Phosphate Inj 100 MG/10ML: INTRAMUSCULAR | Qty: 1 | Status: AC

## 2023-02-12 ENCOUNTER — Ambulatory Visit: Payer: Medicare HMO | Admitting: Hematology

## 2023-02-12 ENCOUNTER — Inpatient Hospital Stay (HOSPITAL_BASED_OUTPATIENT_CLINIC_OR_DEPARTMENT_OTHER): Payer: Medicare HMO | Admitting: Physician Assistant

## 2023-02-12 ENCOUNTER — Other Ambulatory Visit: Payer: Medicare HMO

## 2023-02-12 ENCOUNTER — Inpatient Hospital Stay: Payer: Medicare HMO | Attending: Hematology

## 2023-02-12 ENCOUNTER — Ambulatory Visit: Payer: Medicare HMO

## 2023-02-12 ENCOUNTER — Inpatient Hospital Stay: Payer: Medicare HMO

## 2023-02-12 ENCOUNTER — Other Ambulatory Visit: Payer: Self-pay

## 2023-02-12 VITALS — BP 112/54 | HR 80 | Temp 98.4°F | Resp 16

## 2023-02-12 VITALS — BP 114/63 | HR 87 | Temp 97.5°F | Resp 15 | Wt 152.6 lb

## 2023-02-12 DIAGNOSIS — Z96653 Presence of artificial knee joint, bilateral: Secondary | ICD-10-CM | POA: Diagnosis present

## 2023-02-12 DIAGNOSIS — E876 Hypokalemia: Secondary | ICD-10-CM | POA: Diagnosis not present

## 2023-02-12 DIAGNOSIS — R918 Other nonspecific abnormal finding of lung field: Secondary | ICD-10-CM | POA: Diagnosis not present

## 2023-02-12 DIAGNOSIS — R071 Chest pain on breathing: Secondary | ICD-10-CM | POA: Diagnosis not present

## 2023-02-12 DIAGNOSIS — C8331 Diffuse large B-cell lymphoma, lymph nodes of head, face, and neck: Secondary | ICD-10-CM | POA: Diagnosis not present

## 2023-02-12 DIAGNOSIS — Z86718 Personal history of other venous thrombosis and embolism: Secondary | ICD-10-CM | POA: Diagnosis not present

## 2023-02-12 DIAGNOSIS — R079 Chest pain, unspecified: Secondary | ICD-10-CM | POA: Diagnosis not present

## 2023-02-12 DIAGNOSIS — Z5189 Encounter for other specified aftercare: Secondary | ICD-10-CM | POA: Insufficient documentation

## 2023-02-12 DIAGNOSIS — Z1152 Encounter for screening for COVID-19: Secondary | ICD-10-CM | POA: Diagnosis not present

## 2023-02-12 DIAGNOSIS — C8338 Diffuse large B-cell lymphoma, lymph nodes of multiple sites: Secondary | ICD-10-CM

## 2023-02-12 DIAGNOSIS — I110 Hypertensive heart disease with heart failure: Secondary | ICD-10-CM | POA: Diagnosis not present

## 2023-02-12 DIAGNOSIS — Z7189 Other specified counseling: Secondary | ICD-10-CM

## 2023-02-12 DIAGNOSIS — M47816 Spondylosis without myelopathy or radiculopathy, lumbar region: Secondary | ICD-10-CM | POA: Diagnosis not present

## 2023-02-12 DIAGNOSIS — I5022 Chronic systolic (congestive) heart failure: Secondary | ICD-10-CM | POA: Diagnosis not present

## 2023-02-12 DIAGNOSIS — Z95810 Presence of automatic (implantable) cardiac defibrillator: Secondary | ICD-10-CM | POA: Diagnosis not present

## 2023-02-12 DIAGNOSIS — T451X5A Adverse effect of antineoplastic and immunosuppressive drugs, initial encounter: Secondary | ICD-10-CM | POA: Diagnosis not present

## 2023-02-12 DIAGNOSIS — J9811 Atelectasis: Secondary | ICD-10-CM | POA: Diagnosis not present

## 2023-02-12 DIAGNOSIS — D6481 Anemia due to antineoplastic chemotherapy: Secondary | ICD-10-CM | POA: Diagnosis present

## 2023-02-12 DIAGNOSIS — D6959 Other secondary thrombocytopenia: Secondary | ICD-10-CM | POA: Diagnosis not present

## 2023-02-12 DIAGNOSIS — E785 Hyperlipidemia, unspecified: Secondary | ICD-10-CM | POA: Diagnosis present

## 2023-02-12 DIAGNOSIS — J189 Pneumonia, unspecified organism: Secondary | ICD-10-CM | POA: Diagnosis not present

## 2023-02-12 DIAGNOSIS — Z95828 Presence of other vascular implants and grafts: Secondary | ICD-10-CM

## 2023-02-12 DIAGNOSIS — Z86711 Personal history of pulmonary embolism: Secondary | ICD-10-CM | POA: Diagnosis not present

## 2023-02-12 DIAGNOSIS — I4891 Unspecified atrial fibrillation: Secondary | ICD-10-CM | POA: Diagnosis not present

## 2023-02-12 DIAGNOSIS — F32A Depression, unspecified: Secondary | ICD-10-CM | POA: Diagnosis not present

## 2023-02-12 DIAGNOSIS — Z87891 Personal history of nicotine dependence: Secondary | ICD-10-CM | POA: Diagnosis not present

## 2023-02-12 DIAGNOSIS — D61810 Antineoplastic chemotherapy induced pancytopenia: Secondary | ICD-10-CM | POA: Diagnosis not present

## 2023-02-12 DIAGNOSIS — I252 Old myocardial infarction: Secondary | ICD-10-CM | POA: Diagnosis not present

## 2023-02-12 DIAGNOSIS — I480 Paroxysmal atrial fibrillation: Secondary | ICD-10-CM | POA: Diagnosis not present

## 2023-02-12 DIAGNOSIS — I2510 Atherosclerotic heart disease of native coronary artery without angina pectoris: Secondary | ICD-10-CM | POA: Diagnosis not present

## 2023-02-12 DIAGNOSIS — Z8249 Family history of ischemic heart disease and other diseases of the circulatory system: Secondary | ICD-10-CM | POA: Diagnosis not present

## 2023-02-12 DIAGNOSIS — Z7901 Long term (current) use of anticoagulants: Secondary | ICD-10-CM | POA: Diagnosis not present

## 2023-02-12 DIAGNOSIS — Z955 Presence of coronary angioplasty implant and graft: Secondary | ICD-10-CM | POA: Diagnosis not present

## 2023-02-12 DIAGNOSIS — R0789 Other chest pain: Secondary | ICD-10-CM | POA: Diagnosis not present

## 2023-02-12 DIAGNOSIS — Z85828 Personal history of other malignant neoplasm of skin: Secondary | ICD-10-CM | POA: Diagnosis not present

## 2023-02-12 DIAGNOSIS — I472 Ventricular tachycardia, unspecified: Secondary | ICD-10-CM | POA: Diagnosis not present

## 2023-02-12 LAB — CBC WITH DIFFERENTIAL (CANCER CENTER ONLY)
Abs Immature Granulocytes: 0.02 10*3/uL (ref 0.00–0.07)
Basophils Absolute: 0 10*3/uL (ref 0.0–0.1)
Basophils Relative: 0 %
Eosinophils Absolute: 0 10*3/uL (ref 0.0–0.5)
Eosinophils Relative: 0 %
HCT: 36 % (ref 36.0–46.0)
Hemoglobin: 12.1 g/dL (ref 12.0–15.0)
Immature Granulocytes: 0 %
Lymphocytes Relative: 9 %
Lymphs Abs: 0.4 10*3/uL — ABNORMAL LOW (ref 0.7–4.0)
MCH: 32.1 pg (ref 26.0–34.0)
MCHC: 33.6 g/dL (ref 30.0–36.0)
MCV: 95.5 fL (ref 80.0–100.0)
Monocytes Absolute: 0.4 10*3/uL (ref 0.1–1.0)
Monocytes Relative: 9 %
Neutro Abs: 3.8 10*3/uL (ref 1.7–7.7)
Neutrophils Relative %: 82 %
Platelet Count: 114 10*3/uL — ABNORMAL LOW (ref 150–400)
RBC: 3.77 MIL/uL — ABNORMAL LOW (ref 3.87–5.11)
RDW: 15.5 % (ref 11.5–15.5)
WBC Count: 4.7 10*3/uL (ref 4.0–10.5)
nRBC: 0 % (ref 0.0–0.2)

## 2023-02-12 LAB — CMP (CANCER CENTER ONLY)
ALT: 15 U/L (ref 0–44)
AST: 17 U/L (ref 15–41)
Albumin: 4.1 g/dL (ref 3.5–5.0)
Alkaline Phosphatase: 83 U/L (ref 38–126)
Anion gap: 6 (ref 5–15)
BUN: 14 mg/dL (ref 8–23)
CO2: 26 mmol/L (ref 22–32)
Calcium: 9.1 mg/dL (ref 8.9–10.3)
Chloride: 109 mmol/L (ref 98–111)
Creatinine: 0.73 mg/dL (ref 0.44–1.00)
GFR, Estimated: >60 mL/min (ref >60)
Glucose, Bld: 111 mg/dL — ABNORMAL HIGH (ref 70–99)
Potassium: 3.5 mmol/L (ref 3.5–5.1)
Sodium: 141 mmol/L (ref 135–145)
Total Bilirubin: 0.5 mg/dL (ref 0.3–1.2)
Total Protein: 6.4 g/dL — ABNORMAL LOW (ref 6.5–8.1)

## 2023-02-12 MED ORDER — SODIUM CHLORIDE 0.9% FLUSH
10.0000 mL | Freq: Once | INTRAVENOUS | Status: AC
  Administered 2023-02-12: 10 mL

## 2023-02-12 MED ORDER — SODIUM CHLORIDE 0.9 % IV SOLN
10.0000 mg | Freq: Once | INTRAVENOUS | Status: AC
  Administered 2023-02-12: 10 mg via INTRAVENOUS
  Filled 2023-02-12: qty 10

## 2023-02-12 MED ORDER — PALONOSETRON HCL INJECTION 0.25 MG/5ML
0.2500 mg | Freq: Once | INTRAVENOUS | Status: AC
  Administered 2023-02-12: 0.25 mg via INTRAVENOUS
  Filled 2023-02-12: qty 5

## 2023-02-12 MED ORDER — SODIUM CHLORIDE 0.9 % IV SOLN
50.0000 mg/m2 | Freq: Once | INTRAVENOUS | Status: AC
  Administered 2023-02-12: 100 mg via INTRAVENOUS
  Filled 2023-02-12: qty 5

## 2023-02-12 MED ORDER — SODIUM CHLORIDE 0.9% FLUSH
10.0000 mL | INTRAVENOUS | Status: DC | PRN
  Administered 2023-02-12: 10 mL

## 2023-02-12 MED ORDER — FAMOTIDINE IN NACL 20-0.9 MG/50ML-% IV SOLN
20.0000 mg | Freq: Once | INTRAVENOUS | Status: AC
  Administered 2023-02-12: 20 mg via INTRAVENOUS
  Filled 2023-02-12: qty 50

## 2023-02-12 MED ORDER — SODIUM CHLORIDE 0.9 % IV SOLN: Freq: Once | INTRAVENOUS | Status: AC

## 2023-02-12 MED ORDER — VINCRISTINE SULFATE CHEMO INJECTION 1 MG/ML
1.0000 mg | Freq: Once | INTRAVENOUS | Status: AC
  Administered 2023-02-12: 1 mg via INTRAVENOUS
  Filled 2023-02-12: qty 1

## 2023-02-12 MED ORDER — SODIUM CHLORIDE 0.9 % IV SOLN
375.0000 mg/m2 | Freq: Once | INTRAVENOUS | Status: AC
  Administered 2023-02-12: 700 mg via INTRAVENOUS
  Filled 2023-02-12: qty 50

## 2023-02-12 MED ORDER — HEPARIN SOD (PORK) LOCK FLUSH 100 UNIT/ML IV SOLN
500.0000 [IU] | Freq: Once | INTRAVENOUS | Status: AC | PRN
  Administered 2023-02-12: 500 [IU]

## 2023-02-12 MED ORDER — SODIUM CHLORIDE 0.9 % IV SOLN
500.0000 mg/m2 | Freq: Once | INTRAVENOUS | Status: AC
  Administered 2023-02-12: 1000 mg via INTRAVENOUS
  Filled 2023-02-12: qty 50

## 2023-02-12 MED ORDER — ACETAMINOPHEN 325 MG PO TABS
650.0000 mg | ORAL_TABLET | Freq: Once | ORAL | Status: AC
  Administered 2023-02-12: 650 mg via ORAL
  Filled 2023-02-12: qty 2

## 2023-02-12 MED ORDER — DIPHENHYDRAMINE HCL 25 MG PO CAPS
50.0000 mg | ORAL_CAPSULE | Freq: Once | ORAL | Status: AC
  Administered 2023-02-12: 50 mg via ORAL
  Filled 2023-02-12: qty 2

## 2023-02-12 NOTE — Patient Instructions (Signed)
[facility]  Discharge Instructions: Thank you for choosing [facility] to provide your oncology and hematology care.   If you have a lab appointment with the Cancer Center, please go directly to the Cancer Center and check in at the registration area.   Wear comfortable clothing and clothing appropriate for easy access to any Portacath or PICC line.   We strive to give you quality time with your provider. You may need to reschedule your appointment if you arrive late (15 or more minutes).  Arriving late affects you and other patients whose appointments are after yours.  Also, if you miss three or more appointments without notifying the office, you may be dismissed from the clinic at the provider's discretion.      For prescription refill requests, have your pharmacy contact our office and allow 72 hours for refills to be completed.    Today you received the following chemotherapy and/or immunotherapy agents: vincristine, cyclophosphamide, etoposide, and rituximab-pvvr      To help prevent nausea and vomiting after your treatment, we encourage you to take your nausea medication as directed.  BELOW ARE SYMPTOMS THAT SHOULD BE REPORTED IMMEDIATELY: *FEVER GREATER THAN 100.4 F (38 C) OR HIGHER *CHILLS OR SWEATING *NAUSEA AND VOMITING THAT IS NOT CONTROLLED WITH YOUR NAUSEA MEDICATION *UNUSUAL SHORTNESS OF BREATH *UNUSUAL BRUISING OR BLEEDING *URINARY PROBLEMS (pain or burning when urinating, or frequent urination) *BOWEL PROBLEMS (unusual diarrhea, constipation, pain near the anus) TENDERNESS IN MOUTH AND THROAT WITH OR WITHOUT PRESENCE OF ULCERS (sore throat, sores in mouth, or a toothache) UNUSUAL RASH, SWELLING OR PAIN  UNUSUAL VAGINAL DISCHARGE OR ITCHING   Items with * indicate a potential emergency and should be followed up as soon as possible or go to the Emergency Department if any problems should occur.  Please show the  CHEMOTHERAPY ALERT CARD or IMMUNOTHERAPY ALERT CARD at check-in to the Emergency Department and triage nurse.  Should you have questions after your visit or need to cancel or reschedule your appointment, please contact [place] [facility]  Dept: [phone]  and follow the prompts.  Office hours are 8:00 a.m. to 4:30 p.m. Monday - Friday. Please note that voicemails left after 4:00 p.m. may not be returned until the following business day.  We are closed weekends and major holidays. You have access to a nurse at all times for urgent questions. Please call the main number to the clinic Dept: [phone] and follow the prompts.   For any non-urgent questions, you may also contact your provider using MyChart. We now offer e-Visits for anyone 35 and older to request care online for non-urgent symptoms. For details visit [email or web address].   Also download the MyChart app! Go to the app store, search "MyChart", open the app, select [place], and log in with your MyChart username and password.

## 2023-02-12 NOTE — Progress Notes (Signed)
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  Patient receiving R-CEOP.  Met with patient during infusion.  Reports that her appetite is good.  Denies nutrition impact symptoms at this time.  Has been eating hot dogs and pizza the last few days due to helping with [employer] at her church.  Usually has egg and cheese on english muffin for breakfast/brunch and then dinner meal (kielbasa with kraut, enchiladas).  Usually has scoop of protein powder in tea each morning.     Medications: reviewed  Labs: reviewed  Anthropometrics:   Weight 152 lb 9.6 oz  152 lb on 7/15 156 lb on 6/3 158 lb on 5/13 172 lb on 08/12/22   NUTRITION DIAGNOSIS: Inadequate oral intake stable   INTERVENTION:  Was not interested in mixing oral nutrition supplement with ice cream or making smoothie     MONITORING, EVALUATION, GOAL: weight trends, intake   NEXT VISIT: as needed   B. Freida Busman, RD, LDN Registered Dietitian [phone]

## 2023-02-12 NOTE — Progress Notes (Signed)
HEMATOLOGY/ONCOLOGY CLINIC NOTE  Date of Service: 02/12/23  Patient Care Team: Eunice Blase, PA-C as PCP - General (Internal Medicine) Marinus Maw, MD as PCP - Cardiology (Cardiology) Ebbie Ridge, MD (Family Medicine) Johney Maine, MD as Consulting Physician (Hematology)  CHIEF COMPLAINTS/PURPOSE OF CONSULTATION:  Management of Diffuse large B cell lymphoma  INTERVAL HISTORY:  Rachel Bates is a 77 y.o. female who presents today for follow up and management of diffuse large B-cell lymphoma. She was last seen by Dr. Candise Che on 01/22/2023. She is here prior to Cycle 6, Day 1 of R-CEOP today.   Ms. Konja reports she is tolerating her chemotherapy without any new or concerning symptoms. Her energy and appetite are stable. She is able to complete her daily activities on her own. She denies nausea, vomiting or bowel habit changes. She denies easy bruising or signs of active bleeding. She denies fevers, chills, sweats, shortness of breath, chest pain or cough. She has no other complaints.  MEDICAL HISTORY:  Past Medical History:  Diagnosis Date   AICD (automatic cardioverter/defibrillator) present dual   [employer]tr[employer] ---  original placedment 2007/  generator change 2014 by dr gregg taylor   Anemia    Anticoagulant long-term use    eliquis   Anxiety    Arthralgia of multiple joints    Arthritis pain    Benign hypertensive heart disease    CAD (coronary artery disease) primary cardiologist-- dr gregg taylor   MI and 2 stents 1995 in [place]   Cancer [facility])    CHF NYHA class II, chronic, systolic (HCC)    followed by dr Sharlot Gowda taylor   Degenerative disc disease, lumbar    Depression    Dyslipidemia    History of basal cell carcinoma (BCC) excision    right ankle area s/p  excision in office 06/ 2019  in office   History of DVT of lower extremity yrs ago before 2012   History of MI (myocardial infarction) 1995  in [place]   History of pulmonary embolus (PE) 2012    History of ventricular tachycardia    Hyperlipidemia    Hypersomnia    Hypertension    Ischemic cardiomyopathy    Migraines    Myocardial infarction (HCC)    OA (osteoarthritis)    "all over"   Obstructive apnea    PAF (paroxysmal atrial fibrillation) (HCC)    Primary localized osteoarthritis of right hip 12/18/2017   Primary localized osteoarthritis of right knee 05/28/2018   S/P coronary artery stent placement 1995   in [facility]   05-21-2018 per pt x2  stents in same coronary artery (unsure BM or DES)   Vitamin D deficiency disease     SURGICAL HISTORY: Past Surgical History:  Procedure Laterality Date   BIOPSY  06/19/2022   Procedure: BIOPSY;  Surgeon: Kathi Der, MD;  Location: [facility];  Service: Gastroenterology;;   CARDIAC DEFIBRILLATOR PLACEMENT  2007   CATARACT EXTRACTION, BILATERAL     COLONOSCOPY  04/14/2013   colonic polyp, status post polypectomy. Mild panocolonic diverticulosis. Small internal hemorrhoids   DILATION AND CURETTAGE OF UTERUS  yrs ago   DIRECT LARYNGOSCOPY Right 10/04/2022   Procedure: DIRECT LARYNGOSCOPY;  Surgeon: Cheron Schaumann A, DO;  Location: [facility];  Service: ENT;  Laterality: Right;   ESOPHAGOGASTRODUODENOSCOPY (EGD) WITH PROPOFOL N/A 06/19/2022   Procedure: ESOPHAGOGASTRODUODENOSCOPY (EGD) WITH PROPOFOL;  Surgeon: Kathi Der, MD;  Location: [facility];  Service: Gastroenterology;  Laterality: N/A;   IMPACTION  REMOVAL  06/19/2022   Procedure: IMPACTION REMOVAL;  Surgeon: Kathi Der, MD;  Location: [facility];  Service: Gastroenterology;;   IMPLANTABLE CARDIOVERTER DEFIBRILLATOR GENERATOR CHANGE N/A 02/04/2013   Procedure: IMPLANTABLE CARDIOVERTER DEFIBRILLATOR GENERATOR CHANGE;  Surgeon: Marinus Maw, MD;  Location: [facility] CATH LAB;  Service: Cardiovascular;  Laterality: N/A;   IR IMAGING GUIDED PORT INSERTION  10/27/2022   LYMPH NODE BIOPSY Right 10/04/2022   Procedure: EXCISIONAL OF RIGHT DEEP CERVICAL LYMPH NODE;   Surgeon: Laren Boom, DO;  Location: [facility];  Service: ENT;  Laterality: Right;   SHOULDER ARTHROSCOPY Right 2015   TOTAL HIP ARTHROPLASTY Right 12/18/2017   Procedure: RIGHT TOTAL HIP ARTHROPLASTY;  Surgeon: Teryl Lucy, MD;  Location: [facility];  Service: Orthopedics;  Laterality: Right;   TOTAL HIP ARTHROPLASTY Left 04/19/2021   Procedure: TOTAL HIP ARTHROPLASTY;  Surgeon: Teryl Lucy, MD;  Location: [facility];  Service: Orthopedics;  Laterality: Left;   TOTAL KNEE ARTHROPLASTY Right 05/28/2018   Procedure: TOTAL KNEE ARTHROPLASTY;  Surgeon: Teryl Lucy, MD;  Location: [facility];  Service: Orthopedics;  Laterality: Right;  Adductor Block   TOTAL KNEE ARTHROPLASTY Left 08/12/2021   Procedure: TOTAL KNEE ARTHROPLASTY;  Surgeon: Teryl Lucy, MD;  Location: [facility];  Service: Orthopedics;  Laterality: Left;   TUBAL LIGATION Bilateral yrs ago   WISDOM TOOTH EXTRACTION      SOCIAL HISTORY: Social History   Socioeconomic History   Marital status: Single    Spouse name: Not on file   Number of children: Not on file   Years of education: Not on file   Highest education level: Not on file  Occupational History   Not on file  Tobacco Use   Smoking status: Former    Current packs/day: 0.00    Types: Cigarettes    Start date: 05/21/1966    Quit date: 05/21/1996    Years since quitting: 26.7   Smokeless tobacco: Never  Vaping Use   Vaping status: Never Used  Substance and Sexual Activity   Alcohol use: No   Drug use: Never   Sexual activity: Not on file  Other Topics Concern   Not on file  Social History Narrative   Not on file   Social Determinants of Health   Financial Resource Strain: Low Risk  (05/28/2018)   Overall Financial Resource Strain (CARDIA)    Difficulty of Paying Living Expenses: Not hard at all  Food Insecurity: No Food Insecurity (05/28/2018)   Hunger Vital Sign    Worried About Running Out of Food in the Last Year: Never true    Ran Out of Food in the  Last Year: Never true  Transportation Needs: No Transportation Needs (05/28/2018)   PRAPARE - Administrator, Civil Service (Medical): No    Lack of Transportation (Non-Medical): No  Physical Activity: Not on file  Stress: Not on file  Social Connections: Not on file  Intimate Partner Violence: Not on file    FAMILY HISTORY: Family History  Problem Relation Age of Onset   Hypertension Mother    Thyroid disease Mother    Alzheimer's disease Mother    Coronary artery disease Father    Pulmonary embolism Father    Congestive Heart Failure Maternal Grandmother    Hypertension Maternal Grandmother    Heart attack Maternal Grandfather    Other Maternal Grandfather        carotid disease   Dementia Paternal Grandmother    Other Paternal Grandfather 75  accident    ALLERGIES:  is allergic to sumatriptan succinate and amitriptyline.  MEDICATIONS:  Current Outpatient Medications  Medication Sig Dispense Refill   acetaminophen (TYLENOL) 650 MG CR tablet Take 1,300 mg by mouth 3 (three) times daily.     acyclovir (ZOVIRAX) 400 MG tablet TAKE 1 TABLET BY MOUTH TWICE A DAY 180 tablet 2   allopurinol (ZYLOPRIM) 300 MG tablet TAKE 0.5 TABLETS BY MOUTH DAILY. 45 tablet 1   apixaban (ELIQUIS) 5 MG TABS tablet TAKE 1 TABLET BY MOUTH TWICE A DAY 60 tablet 5   b complex vitamins capsule Take 1 capsule by mouth daily.     Calcium Carb-Cholecalciferol (CALCIUM 600 + D PO) Take 1 tablet by mouth daily.     carvedilol (COREG) 12.5 MG tablet Take 12.5 mg by mouth 2 (two) times daily with a meal.      Cholecalciferol (VITAMIN D3) 125 MCG (5000 UT) capsule Take 5,000 Units by mouth daily.     cyanocobalamin (VITAMIN B12) 1000 MCG tablet Take 1,000 mcg by mouth daily.     FIBER PO Take 1 capsule by mouth 2 (two) times daily.     Fiber POWD Take 1 Scoop by mouth daily as needed (constipation).     furosemide (LASIX) 40 MG tablet Take 40 mg by mouth daily as needed for edema.      gabapentin (NEURONTIN) 300 MG capsule Take 300 mg by mouth 3 (three) times daily.     lidocaine-prilocaine (EMLA) cream Apply to affected area once 30 g 3   loratadine (CLARITIN) 10 MG tablet Take 10 mg by mouth daily.     Omega-3 Fatty Acids (FISH OIL) 1000 MG CAPS Take 1,000 mg by mouth daily.      pantoprazole (PROTONIX) 40 MG tablet Take 1 tablet (40 mg total) by mouth 2 (two) times daily. 180 tablet 3   predniSONE (DELTASONE) 20 MG tablet Take 3 tablets (60 mg total) by mouth daily with breakfast. Take for 5 days now x 1 course and then for 5 days after each cycle of chemotherapy 15 tablet 6   ramipril (ALTACE) 2.5 MG tablet Take 2.5 mg by mouth daily.       simvastatin (ZOCOR) 40 MG tablet Take 40 mg by mouth at bedtime.       traZODone (DESYREL) 50 MG tablet Take 50 mg by mouth at bedtime.      venlafaxine (EFFEXOR) 75 MG tablet Take 75 mg by mouth daily.     vitamin E 400 UNIT capsule Take 400 Units by mouth daily.     zonisamide (ZONEGRAN) 50 MG capsule Take 50-100 mg by mouth See admin instructions. Take 50mg  by mouth in the morning and 100mg  at night.     HYDROcodone-acetaminophen (NORCO/VICODIN) 5-325 MG tablet Take 1 tablet by mouth every 6 (six) hours as needed for moderate pain.     Multiple Vitamin (STRESS FORMULA) TABS Take 1 tablet by mouth daily.     ondansetron (ZOFRAN) 8 MG tablet Take 1 tablet (8 mg total) by mouth every 8 (eight) hours as needed for nausea or vomiting. Start on day 3 after cyclophosphamide chemotherapy. (Patient not taking: Reported on 02/12/2023) 30 tablet 1   prochlorperazine (COMPAZINE) 10 MG tablet Take 1 tablet (10 mg total) by mouth every 6 (six) hours as needed for nausea or vomiting. (Patient not taking: Reported on 02/12/2023) 30 tablet 6   traMADol (ULTRAM) 50 MG tablet Take 1 tablet (50 mg total) by mouth every 12 (twelve)  hours as needed. 30 tablet 0   No current facility-administered medications for this visit.    REVIEW OF SYSTEMS:    10 Point  review of Systems was done is negative except as noted above.   PHYSICAL EXAMINATION: ECOG PERFORMANCE STATUS: 1 - Symptomatic but completely ambulatory .BP 114/63 (BP Location: Left Arm, Patient Position: Sitting)   Pulse 87   Temp (!) 97.5 F (36.4 C) (Temporal)   Resp 15   Wt 152 lb 9.6 oz (69.2 kg)   SpO2 97%   BMI 23.90 kg/m   GENERAL:alert, in no acute distress and comfortable SKIN: no acute rashes, no significant lesions EYES: conjunctiva are pink and non-injected, sclera anicteric LYMPH:  no palpable lymphadenopathy in the cervical or supraclavicular regions LUNGS: clear to auscultation b/l with normal respiratory effort HEART: regular rate & rhythm Extremity: no pedal edema PSYCH: alert & oriented x 3 with fluent speech NEURO: no focal motor/sensory deficits   LABORATORY DATA:  I have reviewed the data as listed    Latest Ref Rng & Units 02/12/2023    9:02 AM 01/22/2023   10:11 AM 01/02/2023    9:52 AM  CBC  WBC 4.0 - 10.5 K/uL 4.7  5.3  3.8   Hemoglobin 12.0 - 15.0 g/dL 16.1  09.6  04.5   Hematocrit 36.0 - 46.0 % 36.0  36.4  35.0   Platelets 150 - 400 K/uL 114  114  102       Latest Ref Rng & Units 01/22/2023   10:11 AM 01/02/2023    9:52 AM 12/11/2022    9:02 AM  CMP  Glucose 70 - 99 mg/dL 86  97  98   BUN 8 - 23 mg/dL 13  14  17    Creatinine 0.44 - 1.00 mg/dL 4.09  8.11  9.14   Sodium 135 - 145 mmol/L 139  140  139   Potassium 3.5 - 5.1 mmol/L 3.9  4.2  3.8   Chloride 98 - 111 mmol/L 106  107  108   CO2 22 - 32 mmol/L 27  27  25    Calcium 8.9 - 10.3 mg/dL 9.7  9.4  9.0   Total Protein 6.5 - 8.1 g/dL 6.7  6.7  6.4   Total Bilirubin 0.3 - 1.2 mg/dL 0.5  0.6  0.5   Alkaline Phos 38 - 126 U/L 115  88  103   AST 15 - 41 U/L 19  20  18    ALT 0 - 44 U/L 19  16  15     Lab Results  Component Value Date   LDH 239 (H) 11/15/2022   Molecular Pathology 10/21/2022:    Surgical Pathology 10/04/22: A. LYMPH NODE, RIGHT LEVEL 2 DEEP CERVICAL, EXCISION:  -Diffuse  large B-cell lymphoma  -See comment COMMENT: The sections show diffuse effacement of the lymph nodal architecture primarily by a population of large lymphoid cells characterized by vesicular chromatin and small nucleoli associated with apoptosis and brisk mitosis.  In some areas, the large atypical lymphoid proliferation extends into the perinodal adipose tissue.  In this background, there are scattered variably sized and somewhat disrupted aggregates of primarily small lymphoid cells characterized by high nuclear cytoplasmic ratio, angulated nuclear contours and small to inconspicuous nucleoli. Flow cytometric analysis was performed [phone] [id number]) and shows a monoclonal, lambda restricted B-cell population expressing CD10.  In addition, immunohistochemical stains for CD3, CD5, CD10, CD20, PAX5, BCL6, Bcl-2, Ki-67, CD30, CD138, CD21, EBV in addition to in situ hybridization  for kappa and lambda were performed with appropriate controls.  The large lymphoid cells are positive for CD20, PAX5, CD10, BCL6, Bcl-2, and partially for cytoplasmic lambda.  No significant staining is seen with EBV, CD30, CD138 or cytoplasmic kappa.  Ki67 shows variably increased expression (more than 50% in some areas). CD21 highlights scattered somewhat disrupted follicular dendritic networks. The lymphoid aggregates of primarily small lymphoid cells previously described show positivity for B-cell markers CD20 and PAX5 in addition to CD10 and Bcl-2.  There is an admixed variable T-cell component in the background as seen with CD3 and CD5 and there is no apparent co-expression of CD5 in B-cell areas.  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   RADIOGRAPHIC STUDIES: I have personally reviewed the radiological images as listed and agreed with the findings in the report. CUP  PACEART REMOTE DEVICE CHECK  Result Date: 02/06/2023 Monthly battery check.  Estimated 22mo Normal device function. No new alerts. Follow up as scheduled monthly [place], [facility]    ASSESSMENT & PLAN:  AMARIEA ZICH is a 77 y.o. female who presents to the clinic for a follow up for diffuse large B-cell lymphoma.   1. 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 -Pre-treatment PET scan from 09/04/2022 showed a large FDG avid mass centered within the left base of tongue and left lingual region which extends into the hypopharyngeal region ventral to the epiglottis. A smaller focus of increased uptake localizes to the right pharyngeal tonsil region.Bilateral tracer avid cervical and supraclavicular lymph nodes compatible with nodal metastasis.There is a 7 mm tracer avid nodule within the anteromedial left upper lobe which is suspicious for pulmonary metastasis.There is increased uptake identified along the long axis of the posterior right seventh rib. No corresponding lytic or sclerotic changes identified on the CT images. Multiple prominent retroperitoneal and mesenteric lymph nodes are identified which exhibit mild tracer uptake. This is a nonspecific finding and may reflect reactive adenopathy. Metastatic adenopathy cannot be excluded.  -Patient started R-CEOP on 10/31/2022 with a plan for a total of 6 cycles.   PLAN: -Due for Cycle 6, Day 1 of R-CEOP today -Labs from today were reviewed and adequate for treatment. WBC 4.7, Hgb 12.1, Plt 114, Creatinine and LFTs normal -Proceed with treatment today without any dose modifications -Post treatment PET scan is scheduled for 02/26/2023.   FOLLOW-UP: --RTC in 3 weeks for labs and follow up with Dr. Candise Che  All of the patient's questions were answered with apparent  satisfaction. The patient knows to call the clinic with any problems, questions or concerns.  I have spent a total of 30 minutes minutes of face-to-face and non-face-to-face time, preparing to see the patient, performing a medically appropriate examination, counseling and educating the patient,  documenting clinical information in the electronic health record, independently interpreting results and communicating results to the patient, and care coordination.   Rachel Kaufmann PA-C Dept of Hematology and Oncology [facility] Phone: [phone]

## 2023-02-13 ENCOUNTER — Inpatient Hospital Stay: Payer: Medicare HMO

## 2023-02-13 VITALS — BP 134/62 | HR 97 | Temp 98.2°F | Resp 18

## 2023-02-13 DIAGNOSIS — Z7189 Other specified counseling: Secondary | ICD-10-CM

## 2023-02-13 DIAGNOSIS — C8338 Diffuse large B-cell lymphoma, lymph nodes of multiple sites: Secondary | ICD-10-CM

## 2023-02-13 MED ORDER — PROCHLORPERAZINE MALEATE 10 MG PO TABS
10.0000 mg | ORAL_TABLET | Freq: Once | ORAL | Status: AC
  Administered 2023-02-13: 10 mg via ORAL
  Filled 2023-02-13: qty 1

## 2023-02-13 MED ORDER — SODIUM CHLORIDE 0.9 % IV SOLN: Freq: Once | INTRAVENOUS | Status: AC

## 2023-02-13 MED ORDER — SODIUM CHLORIDE 0.9% FLUSH
10.0000 mL | INTRAVENOUS | Status: DC | PRN
  Administered 2023-02-13: 10 mL

## 2023-02-13 MED ORDER — HEPARIN SOD (PORK) LOCK FLUSH 100 UNIT/ML IV SOLN
500.0000 [IU] | Freq: Once | INTRAVENOUS | Status: AC | PRN
  Administered 2023-02-13: 500 [IU]

## 2023-02-13 MED ORDER — SODIUM CHLORIDE 0.9 % IV SOLN
50.0000 mg/m2 | Freq: Once | INTRAVENOUS | Status: AC
  Administered 2023-02-13: 100 mg via INTRAVENOUS
  Filled 2023-02-13: qty 5

## 2023-02-13 NOTE — Patient Instructions (Signed)
[facility] [facility]  Discharge Instructions: Thank you for choosing [facility] to provide your oncology and hematology care.   If you have a lab appointment with the Cancer Center, please go directly to the Cancer Center and check in at the registration area.   Wear comfortable clothing and clothing appropriate for easy access to any Portacath or PICC line.   We strive to give you quality time with your provider. You may need to reschedule your appointment if you arrive late (15 or more minutes).  Arriving late affects you and other patients whose appointments are after yours.  Also, if you miss three or more appointments without notifying the office, you may be dismissed from the clinic at the provider's discretion.      For prescription refill requests, have your pharmacy contact our office and allow 72 hours for refills to be completed.    Today you received the following chemotherapy and/or immunotherapy agents; Etoposide      To help prevent nausea and vomiting after your treatment, we encourage you to take your nausea medication as directed.  BELOW ARE SYMPTOMS THAT SHOULD BE REPORTED IMMEDIATELY: *FEVER GREATER THAN 100.4 F (38 C) OR HIGHER *CHILLS OR SWEATING *NAUSEA AND VOMITING THAT IS NOT CONTROLLED WITH YOUR NAUSEA MEDICATION *UNUSUAL SHORTNESS OF BREATH *UNUSUAL BRUISING OR BLEEDING *URINARY PROBLEMS (pain or burning when urinating, or frequent urination) *BOWEL PROBLEMS (unusual diarrhea, constipation, pain near the anus) TENDERNESS IN MOUTH AND THROAT WITH OR WITHOUT PRESENCE OF ULCERS (sore throat, sores in mouth, or a toothache) UNUSUAL RASH, SWELLING OR PAIN  UNUSUAL VAGINAL DISCHARGE OR ITCHING   Items with * indicate a potential emergency and should be followed up as soon as possible or go to the Emergency Department if any problems should occur.  Please show the CHEMOTHERAPY ALERT CARD or IMMUNOTHERAPY ALERT CARD at  check-in to the Emergency Department and triage nurse.  Should you have questions after your visit or need to cancel or reschedule your appointment, please contact [facility]  Dept: [phone]  and follow the prompts.  Office hours are 8:00 a.m. to 4:30 p.m. Monday - Friday. Please note that voicemails left after 4:00 p.m. may not be returned until the following business day.  We are closed weekends and major holidays. You have access to a nurse at all times for urgent questions. Please call the main number to the clinic Dept: [phone] and follow the prompts.   For any non-urgent questions, you may also contact your provider using MyChart. We now offer e-Visits for anyone 18 and older to request care online for non-urgent symptoms. For details visit [email or web address].   Also download the MyChart app! Go to the app store, search "MyChart", open the app, select [place], and log in with your MyChart username and password.   

## 2023-02-14 ENCOUNTER — Emergency Department (HOSPITAL_COMMUNITY): Payer: Medicare HMO

## 2023-02-14 ENCOUNTER — Inpatient Hospital Stay: Payer: Medicare HMO

## 2023-02-14 ENCOUNTER — Inpatient Hospital Stay (HOSPITAL_COMMUNITY)
Admission: EM | Admit: 2023-02-14 | Discharge: 2023-02-16 | DRG: 194 | Disposition: A | Discharge status: Home / Self Care | Payer: Medicare HMO | Attending: Internal Medicine | Admitting: Internal Medicine

## 2023-02-14 ENCOUNTER — Encounter (HOSPITAL_COMMUNITY): Payer: Self-pay

## 2023-02-14 ENCOUNTER — Other Ambulatory Visit: Payer: Self-pay

## 2023-02-14 DIAGNOSIS — E876 Hypokalemia: Secondary | ICD-10-CM | POA: Diagnosis present

## 2023-02-14 DIAGNOSIS — Z96653 Presence of artificial knee joint, bilateral: Secondary | ICD-10-CM | POA: Diagnosis present

## 2023-02-14 DIAGNOSIS — Z955 Presence of coronary angioplasty implant and graft: Secondary | ICD-10-CM

## 2023-02-14 DIAGNOSIS — Z820 Family history of epilepsy and other diseases of the nervous system: Secondary | ICD-10-CM

## 2023-02-14 DIAGNOSIS — I472 Ventricular tachycardia, unspecified: Secondary | ICD-10-CM | POA: Diagnosis not present

## 2023-02-14 DIAGNOSIS — I480 Paroxysmal atrial fibrillation: Secondary | ICD-10-CM | POA: Diagnosis present

## 2023-02-14 DIAGNOSIS — M47816 Spondylosis without myelopathy or radiculopathy, lumbar region: Secondary | ICD-10-CM | POA: Diagnosis not present

## 2023-02-14 DIAGNOSIS — Z85828 Personal history of other malignant neoplasm of skin: Secondary | ICD-10-CM

## 2023-02-14 DIAGNOSIS — C8331 Diffuse large B-cell lymphoma, lymph nodes of head, face, and neck: Secondary | ICD-10-CM | POA: Diagnosis present

## 2023-02-14 DIAGNOSIS — D6481 Anemia due to antineoplastic chemotherapy: Secondary | ICD-10-CM | POA: Diagnosis present

## 2023-02-14 DIAGNOSIS — J189 Pneumonia, unspecified organism: Principal | ICD-10-CM | POA: Diagnosis present

## 2023-02-14 DIAGNOSIS — K219 Gastro-esophageal reflux disease without esophagitis: Secondary | ICD-10-CM | POA: Diagnosis present

## 2023-02-14 DIAGNOSIS — I5022 Chronic systolic (congestive) heart failure: Secondary | ICD-10-CM | POA: Diagnosis present

## 2023-02-14 DIAGNOSIS — F419 Anxiety disorder, unspecified: Secondary | ICD-10-CM | POA: Diagnosis present

## 2023-02-14 DIAGNOSIS — Z8249 Family history of ischemic heart disease and other diseases of the circulatory system: Secondary | ICD-10-CM

## 2023-02-14 DIAGNOSIS — Z86718 Personal history of other venous thrombosis and embolism: Secondary | ICD-10-CM

## 2023-02-14 DIAGNOSIS — R079 Chest pain, unspecified: Secondary | ICD-10-CM | POA: Diagnosis present

## 2023-02-14 DIAGNOSIS — Z96643 Presence of artificial hip joint, bilateral: Secondary | ICD-10-CM | POA: Diagnosis present

## 2023-02-14 DIAGNOSIS — F32A Depression, unspecified: Secondary | ICD-10-CM | POA: Diagnosis present

## 2023-02-14 DIAGNOSIS — I255 Ischemic cardiomyopathy: Secondary | ICD-10-CM | POA: Diagnosis present

## 2023-02-14 DIAGNOSIS — E785 Hyperlipidemia, unspecified: Secondary | ICD-10-CM | POA: Diagnosis present

## 2023-02-14 DIAGNOSIS — Z79899 Other long term (current) drug therapy: Secondary | ICD-10-CM

## 2023-02-14 DIAGNOSIS — Z1152 Encounter for screening for COVID-19: Secondary | ICD-10-CM

## 2023-02-14 DIAGNOSIS — Z95810 Presence of automatic (implantable) cardiac defibrillator: Secondary | ICD-10-CM

## 2023-02-14 DIAGNOSIS — I25119 Atherosclerotic heart disease of native coronary artery with unspecified angina pectoris: Secondary | ICD-10-CM | POA: Diagnosis present

## 2023-02-14 DIAGNOSIS — Z7901 Long term (current) use of anticoagulants: Secondary | ICD-10-CM

## 2023-02-14 DIAGNOSIS — T451X5A Adverse effect of antineoplastic and immunosuppressive drugs, initial encounter: Secondary | ICD-10-CM | POA: Diagnosis present

## 2023-02-14 DIAGNOSIS — Z86711 Personal history of pulmonary embolism: Secondary | ICD-10-CM

## 2023-02-14 DIAGNOSIS — Z888 Allergy status to other drugs, medicaments and biological substances status: Secondary | ICD-10-CM

## 2023-02-14 DIAGNOSIS — I110 Hypertensive heart disease with heart failure: Secondary | ICD-10-CM | POA: Diagnosis present

## 2023-02-14 DIAGNOSIS — R071 Chest pain on breathing: Secondary | ICD-10-CM

## 2023-02-14 DIAGNOSIS — E559 Vitamin D deficiency, unspecified: Secondary | ICD-10-CM | POA: Diagnosis present

## 2023-02-14 DIAGNOSIS — Z7952 Long term (current) use of systemic steroids: Secondary | ICD-10-CM

## 2023-02-14 DIAGNOSIS — R911 Solitary pulmonary nodule: Secondary | ICD-10-CM | POA: Diagnosis present

## 2023-02-14 DIAGNOSIS — D6959 Other secondary thrombocytopenia: Secondary | ICD-10-CM | POA: Diagnosis present

## 2023-02-14 DIAGNOSIS — Z8349 Family history of other endocrine, nutritional and metabolic diseases: Secondary | ICD-10-CM

## 2023-02-14 DIAGNOSIS — J9811 Atelectasis: Secondary | ICD-10-CM | POA: Diagnosis present

## 2023-02-14 DIAGNOSIS — M109 Gout, unspecified: Secondary | ICD-10-CM | POA: Diagnosis present

## 2023-02-14 DIAGNOSIS — Z87891 Personal history of nicotine dependence: Secondary | ICD-10-CM

## 2023-02-14 DIAGNOSIS — I252 Old myocardial infarction: Secondary | ICD-10-CM

## 2023-02-14 LAB — CBC
HCT: 35.7 % — ABNORMAL LOW (ref 36.0–46.0)
Hemoglobin: 11 g/dL — ABNORMAL LOW (ref 12.0–15.0)
MCH: 30.7 pg (ref 26.0–34.0)
MCHC: 30.8 g/dL (ref 30.0–36.0)
MCV: 99.7 fL (ref 80.0–100.0)
Platelets: 117 10*3/uL — ABNORMAL LOW (ref 150–400)
RBC: 3.58 MIL/uL — ABNORMAL LOW (ref 3.87–5.11)
RDW: 15.2 % (ref 11.5–15.5)
WBC: 8.2 10*3/uL (ref 4.0–10.5)
nRBC: 0 % (ref 0.0–0.2)

## 2023-02-14 LAB — BASIC METABOLIC PANEL
Anion gap: 8 (ref 5–15)
BUN: 20 mg/dL (ref 8–23)
CO2: 23 mmol/L (ref 22–32)
Calcium: 8.8 mg/dL — ABNORMAL LOW (ref 8.9–10.3)
Chloride: 106 mmol/L (ref 98–111)
Creatinine, Ser: 0.6 mg/dL (ref 0.44–1.00)
GFR, Estimated: >60 mL/min (ref >60)
Glucose, Bld: 131 mg/dL — ABNORMAL HIGH (ref 70–99)
Potassium: 3.3 mmol/L — ABNORMAL LOW (ref 3.5–5.1)
Sodium: 137 mmol/L (ref 135–145)

## 2023-02-14 LAB — HEPATIC FUNCTION PANEL
ALT: 21 U/L (ref 0–44)
AST: 22 U/L (ref 15–41)
Albumin: 3.8 g/dL (ref 3.5–5.0)
Alkaline Phosphatase: 67 U/L (ref 38–126)
Bilirubin, Direct: 0.2 mg/dL (ref 0.0–0.2)
Indirect Bilirubin: 0.8 mg/dL (ref 0.3–0.9)
Total Bilirubin: 1 mg/dL (ref 0.3–1.2)
Total Protein: 6.2 g/dL — ABNORMAL LOW (ref 6.5–8.1)

## 2023-02-14 LAB — TROPONIN I (HIGH SENSITIVITY): Troponin I (High Sensitivity): 2 ng/L (ref <18)

## 2023-02-14 LAB — LIPASE, BLOOD: Lipase: 31 U/L (ref 11–51)

## 2023-02-14 MED ORDER — AZITHROMYCIN 250 MG PO TABS
500.0000 mg | ORAL_TABLET | Freq: Once | ORAL | Status: AC
  Administered 2023-02-14: 500 mg via ORAL
  Filled 2023-02-14: qty 2

## 2023-02-14 MED ORDER — ONDANSETRON HCL 4 MG/2ML IJ SOLN
4.0000 mg | Freq: Once | INTRAMUSCULAR | Status: AC
  Administered 2023-02-14: 4 mg via INTRAVENOUS
  Filled 2023-02-14: qty 2

## 2023-02-14 MED ORDER — HYDROCODONE-ACETAMINOPHEN 5-325 MG PO TABS
1.0000 | ORAL_TABLET | ORAL | Status: DC | PRN
  Administered 2023-02-15 (×4): 2 via ORAL
  Administered 2023-02-16: 1 via ORAL
  Administered 2023-02-16: 2 via ORAL
  Filled 2023-02-14 (×6): qty 2

## 2023-02-14 MED ORDER — SODIUM CHLORIDE 0.9 % IV SOLN
1.0000 g | Freq: Once | INTRAVENOUS | Status: AC
  Administered 2023-02-14: 1 g via INTRAVENOUS
  Filled 2023-02-14: qty 10

## 2023-02-14 MED ORDER — ALUM & MAG HYDROXIDE-SIMETH 200-200-20 MG/5ML PO SUSP
30.0000 mL | Freq: Once | ORAL | Status: AC
  Administered 2023-02-14: 30 mL via ORAL
  Filled 2023-02-14: qty 30

## 2023-02-14 MED ORDER — FAMOTIDINE 20 MG PO TABS
40.0000 mg | ORAL_TABLET | Freq: Once | ORAL | Status: AC
  Administered 2023-02-14: 40 mg via ORAL
  Filled 2023-02-14: qty 2

## 2023-02-14 MED ORDER — OXYCODONE-ACETAMINOPHEN 5-325 MG PO TABS
1.0000 | ORAL_TABLET | Freq: Once | ORAL | Status: AC
  Administered 2023-02-14: 1 via ORAL
  Filled 2023-02-14: qty 1

## 2023-02-14 MED ORDER — MORPHINE SULFATE (PF) 4 MG/ML IV SOLN
4.0000 mg | Freq: Once | INTRAVENOUS | Status: AC
  Administered 2023-02-14: 4 mg via INTRAVENOUS
  Filled 2023-02-14: qty 1

## 2023-02-14 MED ORDER — PANTOPRAZOLE SODIUM 40 MG IV SOLR
40.0000 mg | Freq: Once | INTRAVENOUS | Status: AC
  Administered 2023-02-14: 40 mg via INTRAVENOUS
  Filled 2023-02-14: qty 10

## 2023-02-14 MED ORDER — MORPHINE SULFATE (PF) 2 MG/ML IV SOLN
1.0000 mg | Freq: Four times a day (QID) | INTRAVENOUS | Status: DC | PRN
  Administered 2023-02-15: 1 mg via INTRAVENOUS
  Filled 2023-02-14: qty 1

## 2023-02-14 MED ORDER — IOHEXOL 350 MG/ML SOLN
75.0000 mL | Freq: Once | INTRAVENOUS | Status: AC | PRN
  Administered 2023-02-14: 75 mL via INTRAVENOUS

## 2023-02-14 NOTE — ED Provider Notes (Signed)
[employer] EMERGENCY DEPARTMENT AT [facility] Provider Note   CSN: [id number] Arrival date & time: 02/14/23  1318     History  Chief Complaint  Patient presents with   Chest Pain   Hypotension    Rachel Bates is a 77 y.o. female.  Patient is a 77 year old female with a history of CAD status post stents and 95, ischemic cardiomyopathy with an AICD, PAF and PE on Eliquis and lymphoma currently undergoing chemotherapy treatment and day 3 of her 3-day cycle which would have been her last treatment who is presenting today with complaints of chest discomfort.  She reports that around 940 this morning around the time she was finishing up breakfast she got a pain in her chest that has not gone away.  It seems to be worse if she tries to take a deep breath and she is having some nausea but denies any vomiting or change in bowel habits.  She has no abdominal pain.  She does not feel like she choked on any food or there is anything stuck in her throat and denies any regurgitation.  She has not had fever or significant shortness of breath.  No cough or congestion.  Denies ever having symptoms like this in the past.  She has not noticed any unilateral leg pain or swelling.  Denies urinary symptoms.  The history is provided by the patient and medical records.  Chest Pain      Home Medications Prior to Admission medications   Medication Sig Start Date End Date Taking? Authorizing Provider  acetaminophen (TYLENOL) 650 MG CR tablet Take 1,300 mg by mouth 3 (three) times daily.    [provider]  acyclovir (ZOVIRAX) 400 MG tablet TAKE 1 TABLET BY MOUTH TWICE A DAY 01/15/23   Johney Maine, MD  allopurinol (ZYLOPRIM) 300 MG tablet TAKE 0.5 TABLETS BY MOUTH DAILY. 12/11/22   Johney Maine, MD  apixaban (ELIQUIS) 5 MG TABS tablet TAKE 1 TABLET BY MOUTH TWICE A DAY 08/21/22   Marinus Maw, MD  b complex vitamins capsule Take 1 capsule by mouth daily.    [provider]  Calcium Carb-Cholecalciferol (CALCIUM 600 + D PO) Take 1 tablet by mouth daily.    [provider]  carvedilol (COREG) 12.5 MG tablet Take 12.5 mg by mouth 2 (two) times daily with a meal.     [provider]  Cholecalciferol (VITAMIN D3) 125 MCG (5000 UT) capsule Take 5,000 Units by mouth daily.    [provider]  cyanocobalamin (VITAMIN B12) 1000 MCG tablet Take 1,000 mcg by mouth daily.    [provider]  FIBER PO Take 1 capsule by mouth 2 (two) times daily.    [provider]  Fiber POWD Take 1 Scoop by mouth daily as needed (constipation).    [provider]  furosemide (LASIX) 40 MG tablet Take 40 mg by mouth daily as needed for edema.    [provider]  gabapentin (NEURONTIN) 300 MG capsule Take 300 mg by mouth 3 (three) times daily. 04/09/20   [provider]  lidocaine-prilocaine (EMLA) cream Apply to affected area once 10/19/22   Johney Maine, MD  loratadine (CLARITIN) 10 MG tablet Take 10 mg by mouth daily.    [provider]  Multiple Vitamin (STRESS FORMULA) TABS Take 1 tablet by mouth daily.    [provider]  Omega-3 Fatty Acids (FISH OIL) 1000 MG CAPS Take 1,000 mg by  mouth daily.     [provider]  ondansetron (ZOFRAN) 8 MG tablet Take 1 tablet (8 mg total) by mouth every 8 (eight) hours as needed for nausea or vomiting. Start on day 3 after cyclophosphamide chemotherapy. Patient not taking: Reported on 02/12/2023 10/19/22   Johney Maine, MD  pantoprazole (PROTONIX) 40 MG tablet Take 1 tablet (40 mg total) by mouth 2 (two) times daily. 06/19/22 06/19/23  Kathi Der, MD  predniSONE (DELTASONE) 20 MG tablet Take 3 tablets (60 mg total) by mouth daily with breakfast. Take for 5 days now x 1 course and then for 5 days after each cycle of chemotherapy 10/19/22   Johney Maine, MD  prochlorperazine (COMPAZINE) 10 MG tablet Take 1 tablet (10 mg  total) by mouth every 6 (six) hours as needed for nausea or vomiting. Patient not taking: Reported on 02/12/2023 10/19/22   Johney Maine, MD  ramipril (ALTACE) 2.5 MG tablet Take 2.5 mg by mouth daily.      [provider]  simvastatin (ZOCOR) 40 MG tablet Take 40 mg by mouth at bedtime.      [provider]  traZODone (DESYREL) 50 MG tablet Take 50 mg by mouth at bedtime.     [provider]  venlafaxine (EFFEXOR) 75 MG tablet Take 75 mg by mouth daily. 03/06/21   [provider]  vitamin E 400 UNIT capsule Take 400 Units by mouth daily.    [provider]  zonisamide (ZONEGRAN) 50 MG capsule Take 50-100 mg by mouth See admin instructions. Take 50mg  by mouth in the morning and 100mg  at night. 09/13/17   [provider]      Allergies    Sumatriptan succinate and Amitriptyline    Review of Systems   Review of Systems  Cardiovascular:  Positive for chest pain.    Physical Exam Updated Vital Signs BP 123/66   Pulse 84   Temp 97.8 F (36.6 C) (Oral)   Resp 18   Ht 5\' 7"  (1.702 m)   Wt 68 kg   SpO2 95%   BMI 23.49 kg/m  Physical Exam Vitals and nursing note reviewed.  Constitutional:      General: She is in acute distress.     Appearance: She is well-developed.     Comments: Appears uncomfortable  HENT:     Head: Normocephalic and atraumatic.  Eyes:     Pupils: Pupils are equal, round, and reactive to light.  Cardiovascular:     Rate and Rhythm: Normal rate and regular rhythm.     Heart sounds: Normal heart sounds. No murmur heard.    No friction rub.  Pulmonary:     Effort: Pulmonary effort is normal.     Breath sounds: Normal breath sounds. No wheezing or rales.     Comments: Port-A-Cath present in the right upper chest Chest:     Chest wall: No tenderness.  Abdominal:     General: Bowel sounds are normal. There is no distension.     Palpations: Abdomen is soft.     Tenderness: There is no abdominal tenderness.  There is no guarding or rebound.  Musculoskeletal:        General: No tenderness. Normal range of motion.     Right lower leg: No edema.     Left lower leg: No edema.     Comments: No edema  Skin:    General: Skin is warm and dry.     Findings: No rash.  Neurological:     Mental Status: She is alert and oriented to person, place, and time.     Cranial Nerves: No cranial nerve deficit.  Psychiatric:        Behavior: Behavior normal.     ED Results / Procedures / Treatments   Labs (all labs ordered are listed, but only abnormal results are displayed) Labs Reviewed  BASIC METABOLIC PANEL - Abnormal; Notable for the following components:      Result Value   Potassium 3.3 (*)    Glucose, Bld 131 (*)    Calcium 8.8 (*)    All other components within normal limits  CBC - Abnormal; Notable for the following components:   RBC 3.58 (*)    Hemoglobin 11.0 (*)    HCT 35.7 (*)    Platelets 117 (*)    All other components within normal limits  HEPATIC FUNCTION PANEL - Abnormal; Notable for the following components:   Total Protein 6.2 (*)    All other components within normal limits  LIPASE, BLOOD  TROPONIN I (HIGH SENSITIVITY)  TROPONIN I (HIGH SENSITIVITY)    EKG EKG Interpretation Date/Time:  Wednesday February 14 2023 13:24:35 EDT Ventricular Rate:  86 PR Interval:  173 QRS Duration:  97 QT Interval:  358 QTC Calculation: 429 R Axis:   62  Text Interpretation: Sinus rhythm Inferior infarct, old Lateral leads are also involved  more pronounced t-wave inversion inV5/V6 from prior EKG Confirmed by Gwyneth Sprout ([id number]) on 02/14/2023 1:53:08 PM  Radiology DG Chest Port 1 View  Result Date: 02/14/2023 CLINICAL DATA:  Chest pain. EXAM: PORTABLE CHEST 1 VIEW COMPARISON:  September 26, 2016. FINDINGS: The heart size and mediastinal contours are within normal limits. Left-sided defibrillator is unchanged. Right internal jugular Port-A-Cath is now noted with distal tip in expected  position of cavoatrial junction. Hypoinflation of the lungs is noted with mild bibasilar subsegmental atelectasis. The visualized skeletal structures are unremarkable. IMPRESSION: Hypoinflation of the lungs with mild bibasilar subsegmental atelectasis. Electronically Signed   By: Lupita Raider M.D.   On: 02/14/2023 15:11    Procedures Procedures    Medications Ordered in ED Medications  ondansetron (ZOFRAN) injection 4 mg (4 mg Intravenous Given 02/14/23 1509)  morphine (PF) 4 MG/ML injection 4 mg (4 mg Intravenous Given 02/14/23 1508)    ED Course/ Medical Decision Making/ A&P                                 Medical Decision Making Amount and/or Complexity of Data Reviewed Labs: ordered. Radiology: ordered.  Risk Prescription drug management.   Pt with multiple medical problems and comorbidities and presenting today with a complaint that caries a high risk for morbidity and mortality.  Here today with pleuritic type chest pain.  Concern for ACS versus PE versus pneumothorax versus GI pathology such as cholecystitis or possible infection as patient is on chemotherapy.  I independently interpreted patient's EKG and EKG today with more pronounced T wave inversion in V5 and V6 compared to an EKG in 2014 no more recent to compare.  No ST findings consistent with STEMI.  Labs and imaging are pending.  Patient given pain control.  Initial blood pressure was normal but repeat blood pressure was 95/48 will watch closely.  3:35 PM Patient's repeat blood pressure is improved.  I independently interpreted patient's labs and BMP, CBC, troponin, LFTs and lipase all without acute abnormalities  other than mild hypokalemia of 3.3.  Will get a CTA to evaluate for PE or other acute events.  No lab findings to suggest cholecystitis or liver issues.         Final Clinical Impression(s) / ED Diagnoses Final diagnoses:  None    Rx / DC Orders ED Discharge Orders     None         Gwyneth Sprout, MD 02/14/23 [phone]

## 2023-02-14 NOTE — ED Triage Notes (Signed)
Indigestion since 0940 that has been worsening. Pt took 2 pepcid PTA with no relief. Pt unable to take deep breath, states pain is a stabbing pain in center of chest. Denies radiation. C/o nausea. Hx of 2 stents, has defibrillator/pacemaker.

## 2023-02-14 NOTE — ED Provider Notes (Signed)
Left CP severe this AM. Trop 2. CTA chest and abdomen. Anticoagulated Eliquis for H/O afib and PE. Physical Exam  BP 123/66   Pulse 84   Temp 97.8 F (36.6 C) (Oral)   Resp 18   Ht 5\' 7"  (1.702 m)   Wt 68 kg   SpO2 95%   BMI 23.49 kg/m   Physical Exam  Procedures  Procedures  ED Course / MDM    Medical Decision Making Amount and/or Complexity of Data Reviewed Labs: ordered. Radiology: ordered.  Risk OTC drugs. Prescription drug management. Decision regarding hospitalization.  Patient remains alert.  She does however become hypoxic off of oxygen to low 90s and with exertion about 87.  CT scan shows areas of lingular pneumonia and basilar opacifications but no PE.  I have personally reviewed the study.  Radiology says no pericardial effusion.  I question small pericardial effusion.  This would be consistent with the patient's pain level and quality.  This point with combination of significant risk factors and hypoxia, I feel safest management is for admission.  Patient has been treated for pneumonia with Rocephin and Zithromax.  [facility] hospitalist consulted for admission.        Arby Barrette, MD 02/14/23 [phone]

## 2023-02-14 NOTE — H&P (Incomplete)
History and Physical   [facility] [place] [facility]   Admission History and Physical [employer], D.O.    Patient Name: Rachel Bates MR#: [id number] Date of Birth: [birth year] Date of Admission: 02/14/2023  Referring MD/NP/PA: Dr. Donnald Garre Primary Care Physician: Eunice Blase, PA-C  Chief Complaint:  Chief Complaint  Patient presents with   Chest Pain   Hypotension    HPI: Rachel Bates is a 77 y.o. 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  Patient was in a usual state of health until this morning she reports the onset of central chest pain described as stabbing while eating breakfast, worse with inspiration.  Associated nausea but no vomiting or diaphoresis.  Pain was unrelieved with Pepcid.  Otherwise there has been no change in status. Patient has been taking medication as prescribed and there has been no recent change in medication or diet.  No recent antibiotics.  There has been no recent illness, hospitalizations, travel or sick contacts.    EMS/ED Course: Patient received morphine, oxycodone, Rocephin, Azithro, Pepcid, Protonix, Maalox, Zofran. Medical admission has been requested for further management of Chest pain, CAP.  Review of Systems:  CONSTITUTIONAL: No fever/chills, fatigue, weakness, weight gain/loss, headache. EYES: No blurry or double vision. ENT: No tinnitus, postnasal drip, redness or soreness of the oropharynx. RESPIRATORY: No cough, dyspnea, wheeze.  No hemoptysis.  CARDIOVASCULAR: Positive chest pain, palpitations, syncope, orthopnea. No lower extremity edema.  GASTROINTESTINAL: No nausea, vomiting, abdominal pain, diarrhea, constipation.  No hematemesis, melena or  hematochezia. GENITOURINARY: No dysuria, frequency, hematuria. ENDOCRINE: No polyuria or nocturia. No heat or cold intolerance. HEMATOLOGY: No anemia, bruising, bleeding. INTEGUMENTARY: No rashes, ulcers, lesions. MUSCULOSKELETAL: No arthritis, gout. NEUROLOGIC: No numbness, tingling, ataxia, seizure-type activity, weakness. PSYCHIATRIC: No anxiety, depression, insomnia.   Past Medical History:  Diagnosis Date   AICD (automatic cardioverter/defibrillator) present dual   [employer] ---  original placedment 2007/  generator change 2014 by dr gregg taylor   Anemia    Anticoagulant long-term use    eliquis   Anxiety    Arthralgia of multiple joints    Arthritis pain    Benign hypertensive heart disease    CAD (coronary artery disease) primary cardiologist-- dr gregg taylor   MI and 2 stents 1995 in [place]   Cancer [facility])    CHF NYHA class II, chronic, systolic (HCC)    followed by dr Sharlot Gowda taylor   Degenerative disc disease, lumbar    Depression    Dyslipidemia    History of basal cell carcinoma (BCC) excision    right ankle area s/p  excision in office 06/ 2019  in office   History of DVT of lower extremity yrs ago before 2012   History of MI (myocardial infarction) 1995  in [place]   History of pulmonary embolus (PE) 2012   History of ventricular tachycardia    Hyperlipidemia    Hypersomnia    Hypertension    Ischemic cardiomyopathy    Migraines    Myocardial infarction (HCC)    OA (osteoarthritis)    "all over"   Obstructive apnea    PAF (paroxysmal atrial fibrillation) (HCC)    Primary localized osteoarthritis of right hip 12/18/2017   Primary localized osteoarthritis of right knee 05/28/2018  S/P coronary artery stent placement 1995   in [place]   05-21-2018 per pt x2  stents in same coronary artery (unsure BM or DES)   Vitamin D deficiency disease     Past Surgical History:  Procedure Laterality Date   BIOPSY  06/19/2022   Procedure: BIOPSY;  Surgeon: Kathi Der, MD;  Location: [facility];  Service: Gastroenterology;;   CARDIAC DEFIBRILLATOR PLACEMENT  2007   CATARACT EXTRACTION, BILATERAL     COLONOSCOPY  04/14/2013   colonic polyp, status post polypectomy. Mild panocolonic diverticulosis. Small internal hemorrhoids   DILATION AND CURETTAGE OF UTERUS  yrs ago   DIRECT LARYNGOSCOPY Right 10/04/2022   Procedure: DIRECT LARYNGOSCOPY;  Surgeon: Cheron Schaumann A, DO;  Location: [facility] OR;  Service: ENT;  Laterality: Right;   ESOPHAGOGASTRODUODENOSCOPY (EGD) WITH PROPOFOL N/A 06/19/2022   Procedure: ESOPHAGOGASTRODUODENOSCOPY (EGD) WITH PROPOFOL;  Surgeon: Kathi Der, MD;  Location: [facility];  Service: Gastroenterology;  Laterality: N/A;   IMPACTION REMOVAL  06/19/2022   Procedure: IMPACTION REMOVAL;  Surgeon: Kathi Der, MD;  Location: [facility];  Service: Gastroenterology;;   IMPLANTABLE CARDIOVERTER DEFIBRILLATOR GENERATOR CHANGE N/A 02/04/2013   Procedure: IMPLANTABLE CARDIOVERTER DEFIBRILLATOR GENERATOR CHANGE;  Surgeon: Marinus Maw, MD;  Location: [facility] CATH LAB;  Service: Cardiovascular;  Laterality: N/A;   IR IMAGING GUIDED PORT INSERTION  10/27/2022   LYMPH NODE BIOPSY Right 10/04/2022   Procedure: EXCISIONAL OF RIGHT DEEP CERVICAL LYMPH NODE;  Surgeon: Laren Boom, DO;  Location: [facility] OR;  Service: ENT;  Laterality: Right;   SHOULDER ARTHROSCOPY Right 2015   TOTAL HIP ARTHROPLASTY Right 12/18/2017   Procedure: RIGHT TOTAL HIP ARTHROPLASTY;  Surgeon: Teryl Lucy, MD;  Location: [facility];  Service: Orthopedics;  Laterality: Right;   TOTAL HIP ARTHROPLASTY Left 04/19/2021   Procedure: TOTAL HIP ARTHROPLASTY;  Surgeon: Teryl Lucy, MD;  Location: [facility];  Service: Orthopedics;  Laterality: Left;   TOTAL KNEE ARTHROPLASTY Right 05/28/2018   Procedure: TOTAL KNEE ARTHROPLASTY;  Surgeon: Teryl Lucy, MD;  Location: [facility];  Service: Orthopedics;  Laterality: Right;  Adductor Block   TOTAL KNEE ARTHROPLASTY Left  08/12/2021   Procedure: TOTAL KNEE ARTHROPLASTY;  Surgeon: Teryl Lucy, MD;  Location: [facility];  Service: Orthopedics;  Laterality: Left;   TUBAL LIGATION Bilateral yrs ago   WISDOM TOOTH EXTRACTION       reports that she quit smoking about 26 years ago. Her smoking use included cigarettes. She started smoking about 56 years ago. She has never used smokeless tobacco. She reports that she does not drink alcohol and does not use drugs.  Allergies  Allergen Reactions   Sumatriptan Succinate Other (See Comments)    Chest pain, no triptans, pt states "it makes my heart race"   Amitriptyline Other (See Comments)    Weight gain    Family History  Problem Relation Age of Onset   Hypertension Mother    Thyroid disease Mother    Alzheimer's disease Mother    Coronary artery disease Father    Pulmonary embolism Father    Congestive Heart Failure Maternal Grandmother    Hypertension Maternal Grandmother    Heart attack Maternal Grandfather    Other Maternal Grandfather        carotid disease   Dementia Paternal Grandmother    Other Paternal Grandfather 77       accident    Prior to Admission medications   Medication Sig Start Date End Date Taking? Authorizing Provider  acetaminophen (TYLENOL) 650 MG CR  tablet Take 1,300 mg by mouth 3 (three) times daily.    [provider]  acyclovir (ZOVIRAX) 400 MG tablet TAKE 1 TABLET BY MOUTH TWICE A DAY 01/15/23   Johney Maine, MD  allopurinol (ZYLOPRIM) 300 MG tablet TAKE 0.5 TABLETS BY MOUTH DAILY. 12/11/22   Johney Maine, MD  apixaban (ELIQUIS) 5 MG TABS tablet TAKE 1 TABLET BY MOUTH TWICE A DAY 08/21/22   Marinus Maw, MD  b complex vitamins capsule Take 1 capsule by mouth daily.    [provider]  Calcium Carb-Cholecalciferol (CALCIUM 600 + D PO) Take 1 tablet by mouth daily.    [provider]  carvedilol (COREG) 12.5 MG tablet Take 12.5 mg by mouth 2 (two) times daily with a meal.     [provider]  Cholecalciferol (VITAMIN D3) 125 MCG (5000 UT) capsule Take 5,000 Units by mouth daily.    [provider]  cyanocobalamin (VITAMIN B12) 1000 MCG tablet Take 1,000 mcg by mouth daily.    [provider]  FIBER PO Take 1 capsule by mouth 2 (two) times daily.    [provider]  Fiber POWD Take 1 Scoop by mouth daily as needed (constipation).    [provider]  furosemide (LASIX) 40 MG tablet Take 40 mg by mouth daily as needed for edema.    [provider]  gabapentin (NEURONTIN) 300 MG capsule Take 300 mg by mouth 3 (three) times daily. 04/09/20   [provider]  lidocaine-prilocaine (EMLA) cream Apply to affected area once 10/19/22   Johney Maine, MD  loratadine (CLARITIN) 10 MG tablet Take 10 mg by mouth daily.    [provider]  Multiple Vitamin (STRESS FORMULA) TABS Take 1 tablet by mouth daily.    [provider]  Omega-3 Fatty Acids (FISH OIL) 1000 MG CAPS Take 1,000 mg by mouth daily.     [provider]  ondansetron (ZOFRAN) 8 MG tablet Take 1 tablet (8 mg total) by mouth every 8 (eight) hours as needed for nausea or vomiting. Start on day 3 after cyclophosphamide chemotherapy. Patient not taking: Reported on 02/12/2023 10/19/22   Johney Maine, MD  pantoprazole (PROTONIX) 40 MG tablet Take 1 tablet (40 mg total) by mouth 2 (two) times daily. 06/19/22 06/19/23  Kathi Der, MD  predniSONE (DELTASONE) 20 MG tablet Take 3 tablets (60 mg total) by mouth daily with breakfast. Take for 5 days now x 1 course and then for 5 days after each cycle of chemotherapy 10/19/22   Johney Maine, MD  prochlorperazine (COMPAZINE) 10 MG tablet Take 1 tablet (10 mg total) by mouth every 6 (six) hours as needed for nausea or vomiting. Patient not taking: Reported on 02/12/2023 10/19/22   Johney Maine, MD  ramipril (ALTACE) 2.5 MG tablet Take 2.5 mg by mouth daily.      [provider]  simvastatin (ZOCOR) 40 MG tablet Take 40 mg by mouth at bedtime.      [provider]  traZODone (DESYREL) 50 MG tablet Take 50 mg by mouth at bedtime.     [provider]  venlafaxine (EFFEXOR) 75 MG tablet Take 75 mg by mouth daily. 03/06/21   [provider]  vitamin E 400 UNIT capsule Take 400 Units by mouth daily.    [provider]  zonisamide (ZONEGRAN) 50 MG capsule Take 50-100 mg by mouth See admin instructions. Take 50mg  by mouth in the morning  and 100mg  at night. 09/13/17   [provider]    Physical Exam: Vitals:   02/14/23 2107 02/14/23 2112 02/14/23 2116 02/14/23 2130  BP:    (!) 95/54  Pulse:  80  72  Resp:    18  Temp:   98.5 F (36.9 C)   TempSrc:   Oral   SpO2: (!) 87% 91%  90%  Weight:      Height:        GENERAL: 77 y.o.-year-old white female  patient, well-developed, well-nourished lying in the bed in no acute distress.  Pleasant and cooperative.   HEENT: Head atraumatic, normocephalic. Pupils equal. Mucus membranes moist. NECK: Supple. No JVD. CHEST: Normal breath sounds bilaterally. No wheezing, rales, rhonchi or crackles. No use of accessory muscles of respiration.  No reproducible chest wall tenderness.  CARDIOVASCULAR: S1, S2 normal. No murmurs, rubs, or gallops. Cap refill <2 seconds. Pulses intact distally.  ABDOMEN: Soft, nondistended, nontender. No rebound, guarding, rigidity. Normoactive bowel sounds present in all four quadrants.  EXTREMITIES: No pedal edema, cyanosis, or clubbing. No calf tenderness or Homan's sign.  NEUROLOGIC: The patient is alert and oriented x 3. Cranial nerves II through XII are grossly intact with no focal sensorimotor deficit. PSYCHIATRIC:  Normal affect, mood, thought content. SKIN: Warm, dry, and intact without obvious rash, lesion, or ulcer.    Labs on Admission:  CBC: Recent Labs  Lab 02/12/23 0902 02/14/23 1333  WBC 4.7 8.2  NEUTROABS 3.8  --   HGB 12.1  11.0*  HCT 36.0 35.7*  MCV 95.5 99.7  PLT 114* 117*   Basic Metabolic Panel: Recent Labs  Lab 02/12/23 0902 02/14/23 1333  NA 141 137  K 3.5 3.3*  CL 109 106  CO2 26 23  GLUCOSE 111* 131*  BUN 14 20  CREATININE 0.73 0.60  CALCIUM 9.1 8.8*   GFR: Estimated Creatinine Clearance: 57.3 mL/min (by C-G formula based on SCr of 0.6 mg/dL). Liver Function Tests: Recent Labs  Lab 02/12/23 0902 02/14/23 1333  AST 17 22  ALT 15 21  ALKPHOS 83 67  BILITOT 0.5 1.0  PROT 6.4* 6.2*  ALBUMIN 4.1 3.8   Recent Labs  Lab 02/14/23 1333  LIPASE 31   No results for input(s): "AMMONIA" in the last 168 hours. Coagulation Profile: No results for input(s): "INR", "PROTIME" in the last 168 hours. Cardiac Enzymes: No results for input(s): "CKTOTAL", "CKMB", "CKMBINDEX", "TROPONINI" in the last 168 hours. BNP (last 3 results) No results for input(s): "PROBNP" in the last 8760 hours. HbA1C: No results for input(s): "HGBA1C" in the last 72 hours. CBG: No results for input(s): "GLUCAP" in the last 168 hours. Lipid Profile: No results for input(s): "CHOL", "HDL", "LDLCALC", "TRIG", "CHOLHDL", "LDLDIRECT" in the last 72 hours. Thyroid Function Tests: No results for input(s): "TSH", "T4TOTAL", "FREET4", "T3FREE", "THYROIDAB" in the last 72 hours. Anemia Panel: No results for input(s): "VITAMINB12", "FOLATE", "FERRITIN", "TIBC", "IRON", "RETICCTPCT" in the last 72 hours. Urine analysis: No results found for: "COLORURINE", "APPEARANCEUR", "LABSPEC", "PHURINE", "GLUCOSEU", "HGBUR", "BILIRUBINUR", "KETONESUR", "PROTEINUR", "UROBILINOGEN", "NITRITE", "LEUKOCYTESUR" Sepsis Labs: @LABRCNTIP (procalcitonin:4,lacticidven:4) )No results found for this or any previous visit (from the past 240 hour(s)).   Radiological Exams on Admission: CT Angio Chest PE W and/or Wo Contrast  Result Date: 02/14/2023 CLINICAL DATA:  Indigestion, chest pain, history of diffuse large B-cell lymphoma EXAM: CT ANGIOGRAPHY  CHEST WITH CONTRAST TECHNIQUE: Multidetector CT imaging of the chest was performed using the standard protocol during bolus administration of intravenous contrast.  Multiplanar CT image reconstructions and MIPs were obtained to evaluate the vascular anatomy. RADIATION DOSE REDUCTION: This exam was performed according to the departmental dose-optimization program which includes automated exposure control, adjustment of the mA and/or kV according to patient size and/or use of iterative reconstruction technique. CONTRAST:  75mL OMNIPAQUE IOHEXOL 350 MG/ML SOLN COMPARISON:  PET-CT dated 12/25/2022 FINDINGS: Cardiovascular: Satisfactory opacification of the bilateral pulmonary arteries to the segmental level. No evidence of pulmonary embolism. Although not tailored for evaluation of the thoracic aorta, there is no evidence of thoracic aortic aneurysm or dissection. Atherosclerotic calcifications of the aortic arch. Cardiomegaly.  No pericardial effusion.  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 Right chest port terminates at the cavoatrial junction. Mediastinum/Nodes: No suspicious mediastinal lymphadenopathy. Visualized thyroid is unremarkable. Lungs/Pleura: Evaluation of the lung parenchyma is constrained by respiratory motion. Within that constraint, there are clustered posterior left upper lobe nodules measuring up to 7 mm (series 12/image 46), new from PET, likely reflecting mild infection/inflammation. Superimposed patchy opacities in the lingula and bilateral lower lobes, suggesting pneumonia. 10 mm solid nodule in the central right upper lobe (series 12/image 51), non FDG avid on prior PET but favored to be mildly progressive on the current study (previously measured as 5 mm but might measure as much as 8 mm in retrospect). No pleural effusion or pneumothorax. Upper Abdomen: Visualized upper abdomen is grossly unremarkable. Musculoskeletal: Visualized  osseous structures are within normal limits. Review of the MIP images confirms the above findings. IMPRESSION: No evidence of pulmonary embolism. Lingular and bilateral lower lobe pneumonia. 10 mm central right upper lobe nodule, mildly progressive. Early primary bronchogenic carcinoma is possible. Follow-up CT chest is suggested in 3 months. Electronically Signed   By: Charline Bills M.D.   On: 02/14/2023 17:28   DG Chest Port 1 View  Result Date: 02/14/2023 CLINICAL DATA:  Chest pain. EXAM: PORTABLE CHEST 1 VIEW COMPARISON:  September 26, 2016. FINDINGS: The heart size and mediastinal contours are within normal limits. Left-sided defibrillator is unchanged. Right internal jugular Port-A-Cath is now noted with distal tip in expected position of cavoatrial junction. Hypoinflation of the lungs is noted with mild bibasilar subsegmental atelectasis. The visualized skeletal structures are unremarkable. IMPRESSION: Hypoinflation of the lungs with mild bibasilar subsegmental atelectasis. Electronically Signed   By: Lupita Raider M.D.   On: 02/14/2023 15:11    EKG: Normal sinus rhythm at 86 bpm with normal axis , TWI V5-6and nonspecific ST-T wave changes.   Assessment/Plan  This is a 77 y.o. 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 Bilateral lower lobe and lingular Community Acquired Pneumonia - Admit to obs, tele - IV Rocephin & Azithromycin per pharmacy - IV fluid hydration - Duonebs, expectorants & O2 therapy as needed - Follow up blood & sputum cultures   #. Chest pain, rule out ACS - Trend troponins, check lipids and TSH. - Morphine, nitro, beta blocker, aspirin and statin ordered.   - Check echo  #. Mild hypokalemia - Replace orally  #. Pulm nodule will need follow up   #. History of Ischemic  cardiomyopathy - Continue Coreg, ramipril, simvastatin  #. History of PAF - Continue Eliquis,   #. History of GERD - Continue Protonix  #. History of gout  -  Continue allopurinol  #. History of anxiety/depresson - Continue Venlafaxine  #. History of lymphoma - Continue prednisone  Admission status: Obs tele IV Fluids: HL Diet/Nutrition: Heart healthy Consults called: Cardio  DVT Px: Eliquis, SCDs and early ambulation. Code Status: Full Code  Disposition Plan: To home in less than 24 hours  All the records are reviewed and case discussed with ED provider. Management plans discussed with the patient and/or family who express understanding and agree with plan of care.    D.O. on 02/14/2023 at 9:58 PM CC: Primary care physician; Eunice Blase, PA-C   02/14/2023, 9:58 PM

## 2023-02-14 NOTE — ED Notes (Signed)
Patient transported to CT 

## 2023-02-15 ENCOUNTER — Observation Stay (HOSPITAL_COMMUNITY): Payer: Medicare HMO

## 2023-02-15 ENCOUNTER — Telehealth: Payer: Self-pay | Admitting: Internal Medicine

## 2023-02-15 DIAGNOSIS — D6481 Anemia due to antineoplastic chemotherapy: Secondary | ICD-10-CM | POA: Diagnosis present

## 2023-02-15 DIAGNOSIS — Z85828 Personal history of other malignant neoplasm of skin: Secondary | ICD-10-CM | POA: Diagnosis not present

## 2023-02-15 DIAGNOSIS — J189 Pneumonia, unspecified organism: Secondary | ICD-10-CM | POA: Diagnosis present

## 2023-02-15 DIAGNOSIS — I480 Paroxysmal atrial fibrillation: Secondary | ICD-10-CM | POA: Diagnosis not present

## 2023-02-15 DIAGNOSIS — I472 Ventricular tachycardia, unspecified: Secondary | ICD-10-CM | POA: Diagnosis not present

## 2023-02-15 DIAGNOSIS — I2510 Atherosclerotic heart disease of native coronary artery without angina pectoris: Secondary | ICD-10-CM | POA: Diagnosis not present

## 2023-02-15 DIAGNOSIS — I5022 Chronic systolic (congestive) heart failure: Secondary | ICD-10-CM | POA: Diagnosis present

## 2023-02-15 DIAGNOSIS — Z1152 Encounter for screening for COVID-19: Secondary | ICD-10-CM | POA: Diagnosis not present

## 2023-02-15 DIAGNOSIS — Z95810 Presence of automatic (implantable) cardiac defibrillator: Secondary | ICD-10-CM | POA: Diagnosis not present

## 2023-02-15 DIAGNOSIS — E785 Hyperlipidemia, unspecified: Secondary | ICD-10-CM | POA: Diagnosis present

## 2023-02-15 DIAGNOSIS — D6959 Other secondary thrombocytopenia: Secondary | ICD-10-CM | POA: Diagnosis present

## 2023-02-15 DIAGNOSIS — I110 Hypertensive heart disease with heart failure: Secondary | ICD-10-CM | POA: Diagnosis present

## 2023-02-15 DIAGNOSIS — R079 Chest pain, unspecified: Secondary | ICD-10-CM | POA: Diagnosis present

## 2023-02-15 DIAGNOSIS — C8338 Diffuse large B-cell lymphoma, lymph nodes of multiple sites: Secondary | ICD-10-CM | POA: Diagnosis not present

## 2023-02-15 DIAGNOSIS — D61810 Antineoplastic chemotherapy induced pancytopenia: Secondary | ICD-10-CM | POA: Diagnosis not present

## 2023-02-15 DIAGNOSIS — J9811 Atelectasis: Secondary | ICD-10-CM | POA: Diagnosis present

## 2023-02-15 DIAGNOSIS — Z86718 Personal history of other venous thrombosis and embolism: Secondary | ICD-10-CM | POA: Diagnosis not present

## 2023-02-15 DIAGNOSIS — F32A Depression, unspecified: Secondary | ICD-10-CM | POA: Diagnosis present

## 2023-02-15 DIAGNOSIS — E876 Hypokalemia: Secondary | ICD-10-CM | POA: Diagnosis present

## 2023-02-15 DIAGNOSIS — C8331 Diffuse large B-cell lymphoma, lymph nodes of head, face, and neck: Secondary | ICD-10-CM | POA: Diagnosis present

## 2023-02-15 DIAGNOSIS — T451X5A Adverse effect of antineoplastic and immunosuppressive drugs, initial encounter: Secondary | ICD-10-CM | POA: Diagnosis present

## 2023-02-15 DIAGNOSIS — Z96653 Presence of artificial knee joint, bilateral: Secondary | ICD-10-CM | POA: Diagnosis present

## 2023-02-15 DIAGNOSIS — R071 Chest pain on breathing: Secondary | ICD-10-CM | POA: Diagnosis present

## 2023-02-15 DIAGNOSIS — Z87891 Personal history of nicotine dependence: Secondary | ICD-10-CM | POA: Diagnosis not present

## 2023-02-15 DIAGNOSIS — Z8249 Family history of ischemic heart disease and other diseases of the circulatory system: Secondary | ICD-10-CM | POA: Diagnosis not present

## 2023-02-15 DIAGNOSIS — R0789 Other chest pain: Secondary | ICD-10-CM | POA: Diagnosis not present

## 2023-02-15 DIAGNOSIS — Z7901 Long term (current) use of anticoagulants: Secondary | ICD-10-CM | POA: Diagnosis not present

## 2023-02-15 DIAGNOSIS — Z86711 Personal history of pulmonary embolism: Secondary | ICD-10-CM | POA: Diagnosis not present

## 2023-02-15 DIAGNOSIS — Z955 Presence of coronary angioplasty implant and graft: Secondary | ICD-10-CM | POA: Diagnosis not present

## 2023-02-15 DIAGNOSIS — I252 Old myocardial infarction: Secondary | ICD-10-CM | POA: Diagnosis not present

## 2023-02-15 LAB — ECHOCARDIOGRAM COMPLETE
AR max vel: 2.93 cm2
AV Area VTI: 2.61 cm2
AV Area mean vel: 2.64 cm2
AV Mean grad: 3 mmHg
AV Peak grad: 5.3 mmHg
Ao pk vel: 1.15 m/s
Area-P 1/2: 3 cm2
Calc EF: 50.4 %
Est EF: 50
Height: 67 in
MV VTI: 2.84 cm2
S' Lateral: 3.7 cm
Single Plane A2C EF: 45 %
Single Plane A4C EF: 54.6 %
Weight: 2400 oz

## 2023-02-15 LAB — CBC
HCT: 30.7 % — ABNORMAL LOW (ref 36.0–46.0)
Hemoglobin: 9.6 g/dL — ABNORMAL LOW (ref 12.0–15.0)
MCH: 31 pg (ref 26.0–34.0)
MCHC: 31.3 g/dL (ref 30.0–36.0)
MCV: 99 fL (ref 80.0–100.0)
Platelets: 93 10*3/uL — ABNORMAL LOW (ref 150–400)
RBC: 3.1 MIL/uL — ABNORMAL LOW (ref 3.87–5.11)
RDW: 15 % (ref 11.5–15.5)
WBC: 5.1 10*3/uL (ref 4.0–10.5)
nRBC: 0 % (ref 0.0–0.2)

## 2023-02-15 LAB — MAGNESIUM: Magnesium: 2 mg/dL (ref 1.7–2.4)

## 2023-02-15 LAB — COMPREHENSIVE METABOLIC PANEL
ALT: 18 U/L (ref 0–44)
AST: 14 U/L — ABNORMAL LOW (ref 15–41)
Albumin: 3.3 g/dL — ABNORMAL LOW (ref 3.5–5.0)
Alkaline Phosphatase: 56 U/L (ref 38–126)
Anion gap: 8 (ref 5–15)
BUN: 16 mg/dL (ref 8–23)
CO2: 26 mmol/L (ref 22–32)
Calcium: 8.8 mg/dL — ABNORMAL LOW (ref 8.9–10.3)
Chloride: 105 mmol/L (ref 98–111)
Creatinine, Ser: 0.47 mg/dL (ref 0.44–1.00)
GFR, Estimated: >60 mL/min (ref >60)
Glucose, Bld: 99 mg/dL (ref 70–99)
Potassium: 3.4 mmol/L — ABNORMAL LOW (ref 3.5–5.1)
Sodium: 139 mmol/L (ref 135–145)
Total Bilirubin: 0.9 mg/dL (ref 0.3–1.2)
Total Protein: 5.7 g/dL — ABNORMAL LOW (ref 6.5–8.1)

## 2023-02-15 LAB — BRAIN NATRIURETIC PEPTIDE: B Natriuretic Peptide: 322 pg/mL — ABNORMAL HIGH (ref 0.0–100.0)

## 2023-02-15 LAB — TSH: TSH: 3.241 u[IU]/mL (ref 0.350–4.500)

## 2023-02-15 LAB — PHOSPHORUS: Phosphorus: 3.6 mg/dL (ref 2.5–4.6)

## 2023-02-15 MED ORDER — ONDANSETRON HCL 4 MG/2ML IJ SOLN: 4.0000 mg | Freq: Four times a day (QID) | INTRAMUSCULAR | Status: DC | PRN

## 2023-02-15 MED ORDER — SENNOSIDES-DOCUSATE SODIUM 8.6-50 MG PO TABS: 1.0000 | ORAL_TABLET | Freq: Every evening | ORAL | Status: DC | PRN

## 2023-02-15 MED ORDER — SODIUM CHLORIDE 0.9% FLUSH
3.0000 mL | Freq: Two times a day (BID) | INTRAVENOUS | Status: DC
  Administered 2023-02-15 (×2): 3 mL via INTRAVENOUS

## 2023-02-15 MED ORDER — RAMIPRIL 1.25 MG PO CAPS
2.5000 mg | ORAL_CAPSULE | Freq: Every day | ORAL | Status: DC
  Administered 2023-02-15 – 2023-02-16 (×2): 2.5 mg via ORAL
  Filled 2023-02-15 (×2): qty 2

## 2023-02-15 MED ORDER — CARVEDILOL 12.5 MG PO TABS
12.5000 mg | ORAL_TABLET | Freq: Two times a day (BID) | ORAL | Status: DC
  Administered 2023-02-15 – 2023-02-16 (×3): 12.5 mg via ORAL
  Filled 2023-02-15 (×3): qty 1

## 2023-02-15 MED ORDER — BISACODYL 5 MG PO TBEC: 5.0000 mg | DELAYED_RELEASE_TABLET | Freq: Every day | ORAL | Status: DC | PRN

## 2023-02-15 MED ORDER — LORATADINE 10 MG PO TABS
10.0000 mg | ORAL_TABLET | Freq: Every day | ORAL | Status: DC
  Administered 2023-02-15 – 2023-02-16 (×2): 10 mg via ORAL
  Filled 2023-02-15 (×2): qty 1

## 2023-02-15 MED ORDER — APIXABAN 5 MG PO TABS
5.0000 mg | ORAL_TABLET | Freq: Two times a day (BID) | ORAL | Status: DC
  Administered 2023-02-15 – 2023-02-16 (×3): 5 mg via ORAL
  Filled 2023-02-15 (×3): qty 1

## 2023-02-15 MED ORDER — ASPIRIN 81 MG PO TBEC
81.0000 mg | DELAYED_RELEASE_TABLET | Freq: Every day | ORAL | Status: DC
  Filled 2023-02-15 (×2): qty 1

## 2023-02-15 MED ORDER — GABAPENTIN 300 MG PO CAPS
300.0000 mg | ORAL_CAPSULE | Freq: Three times a day (TID) | ORAL | Status: DC
  Administered 2023-02-15 – 2023-02-16 (×4): 300 mg via ORAL
  Filled 2023-02-15 (×4): qty 1

## 2023-02-15 MED ORDER — ALBUTEROL SULFATE (2.5 MG/3ML) 0.083% IN NEBU: 2.5000 mg | INHALATION_SOLUTION | Freq: Four times a day (QID) | RESPIRATORY_TRACT | Status: DC | PRN

## 2023-02-15 MED ORDER — IPRATROPIUM BROMIDE 0.02 % IN SOLN: 0.5000 mg | Freq: Four times a day (QID) | RESPIRATORY_TRACT | Status: DC | PRN

## 2023-02-15 MED ORDER — ZONISAMIDE 25 MG PO CAPS
50.0000 mg | ORAL_CAPSULE | Freq: Every day | ORAL | Status: DC
  Administered 2023-02-15 – 2023-02-16 (×2): 50 mg via ORAL
  Filled 2023-02-15 (×2): qty 2

## 2023-02-15 MED ORDER — VITAMIN D 25 MCG (1000 UNIT) PO TABS
5000.0000 [IU] | ORAL_TABLET | Freq: Every day | ORAL | Status: DC
  Administered 2023-02-15 – 2023-02-16 (×2): 5000 [IU] via ORAL
  Filled 2023-02-15 (×2): qty 5

## 2023-02-15 MED ORDER — VENLAFAXINE HCL 75 MG PO TABS
75.0000 mg | ORAL_TABLET | Freq: Every day | ORAL | Status: DC
  Administered 2023-02-15 – 2023-02-16 (×2): 75 mg via ORAL
  Filled 2023-02-15 (×2): qty 1

## 2023-02-15 MED ORDER — ACYCLOVIR 400 MG PO TABS
400.0000 mg | ORAL_TABLET | Freq: Two times a day (BID) | ORAL | Status: DC
  Administered 2023-02-15 – 2023-02-16 (×3): 400 mg via ORAL
  Filled 2023-02-15 (×3): qty 1

## 2023-02-15 MED ORDER — TIZANIDINE HCL 4 MG PO TABS: 2.0000 mg | ORAL_TABLET | Freq: Every day | ORAL | Status: DC | PRN

## 2023-02-15 MED ORDER — ONDANSETRON HCL 4 MG PO TABS: 4.0000 mg | ORAL_TABLET | Freq: Four times a day (QID) | ORAL | Status: DC | PRN

## 2023-02-15 MED ORDER — PREDNISONE 20 MG PO TABS: 60.0000 mg | ORAL_TABLET | Freq: Every day | ORAL | Status: DC

## 2023-02-15 MED ORDER — SIMVASTATIN 40 MG PO TABS
40.0000 mg | ORAL_TABLET | Freq: Every day | ORAL | Status: DC
  Administered 2023-02-15: 40 mg via ORAL
  Filled 2023-02-15: qty 1

## 2023-02-15 MED ORDER — SODIUM CHLORIDE 0.9% FLUSH: 10.0000 mL | INTRAVENOUS | Status: DC | PRN

## 2023-02-15 MED ORDER — SODIUM CHLORIDE 0.9% FLUSH
10.0000 mL | Freq: Two times a day (BID) | INTRAVENOUS | Status: DC
  Administered 2023-02-16 (×2): 10 mL

## 2023-02-15 MED ORDER — ACETAMINOPHEN 325 MG PO TABS: 650.0000 mg | ORAL_TABLET | Freq: Four times a day (QID) | ORAL | Status: DC | PRN

## 2023-02-15 MED ORDER — TRAZODONE HCL 50 MG PO TABS
25.0000 mg | ORAL_TABLET | Freq: Every evening | ORAL | Status: DC | PRN
  Administered 2023-02-15: 25 mg via ORAL
  Filled 2023-02-15: qty 1

## 2023-02-15 MED ORDER — HYDRALAZINE HCL 20 MG/ML IJ SOLN: 5.0000 mg | Freq: Three times a day (TID) | INTRAMUSCULAR | Status: DC | PRN

## 2023-02-15 MED ORDER — SODIUM CHLORIDE 0.9 % IV SOLN
1.0000 g | INTRAVENOUS | Status: DC
  Administered 2023-02-15: 1 g via INTRAVENOUS
  Filled 2023-02-15 (×2): qty 10

## 2023-02-15 MED ORDER — SODIUM CHLORIDE 0.9 % IV SOLN
500.0000 mg | INTRAVENOUS | Status: DC
  Administered 2023-02-15: 500 mg via INTRAVENOUS
  Filled 2023-02-15 (×2): qty 5

## 2023-02-15 MED ORDER — POTASSIUM CHLORIDE CRYS ER 20 MEQ PO TBCR
20.0000 meq | EXTENDED_RELEASE_TABLET | Freq: Two times a day (BID) | ORAL | Status: DC
  Administered 2023-02-15 – 2023-02-16 (×3): 20 meq via ORAL
  Filled 2023-02-15 (×3): qty 1

## 2023-02-15 MED ORDER — PERFLUTREN LIPID MICROSPHERE
1.0000 mL | INTRAVENOUS | Status: AC | PRN
  Administered 2023-02-15: 3 mL via INTRAVENOUS

## 2023-02-15 MED ORDER — OMEGA-3-ACID ETHYL ESTERS 1 G PO CAPS
1000.0000 mg | ORAL_CAPSULE | Freq: Every day | ORAL | Status: DC
  Administered 2023-02-15 – 2023-02-16 (×2): 1000 mg via ORAL
  Filled 2023-02-15 (×2): qty 1

## 2023-02-15 MED ORDER — NITROGLYCERIN 0.4 MG SL SUBL: 0.4000 mg | SUBLINGUAL_TABLET | SUBLINGUAL | Status: DC | PRN

## 2023-02-15 MED ORDER — CHLORHEXIDINE GLUCONATE CLOTH 2 % EX PADS
6.0000 | MEDICATED_PAD | Freq: Every day | CUTANEOUS | Status: DC
  Administered 2023-02-16: 6 via TOPICAL

## 2023-02-15 MED ORDER — FIBER 500 MG PO CAPS: 1.0000 | ORAL_CAPSULE | Freq: Two times a day (BID) | ORAL | Status: DC

## 2023-02-15 MED ORDER — ZONISAMIDE 100 MG PO CAPS
100.0000 mg | ORAL_CAPSULE | Freq: Every day | ORAL | Status: DC
  Administered 2023-02-15: 100 mg via ORAL
  Filled 2023-02-15: qty 1

## 2023-02-15 MED ORDER — OYSTER SHELL CALCIUM/D3 500-5 MG-MCG PO TABS
1.0000 | ORAL_TABLET | Freq: Every day | ORAL | Status: DC
  Administered 2023-02-15 – 2023-02-16 (×2): 1 via ORAL
  Filled 2023-02-15 (×2): qty 1

## 2023-02-15 MED ORDER — VITAMIN E 45 MG (100 UNIT) PO CAPS
400.0000 [IU] | ORAL_CAPSULE | Freq: Every day | ORAL | Status: DC
  Administered 2023-02-15 – 2023-02-16 (×2): 400 [IU] via ORAL
  Filled 2023-02-15 (×2): qty 4

## 2023-02-15 MED ORDER — ALLOPURINOL 300 MG PO TABS
150.0000 mg | ORAL_TABLET | Freq: Every day | ORAL | Status: DC
  Administered 2023-02-15 – 2023-02-16 (×2): 150 mg via ORAL
  Filled 2023-02-15 (×2): qty 1

## 2023-02-15 MED ORDER — PANTOPRAZOLE SODIUM 40 MG PO TBEC
40.0000 mg | DELAYED_RELEASE_TABLET | Freq: Two times a day (BID) | ORAL | Status: DC
  Administered 2023-02-15 – 2023-02-16 (×3): 40 mg via ORAL
  Filled 2023-02-15 (×3): qty 1

## 2023-02-15 MED ORDER — CALCIUM POLYCARBOPHIL 625 MG PO TABS
625.0000 mg | ORAL_TABLET | Freq: Two times a day (BID) | ORAL | Status: DC
  Administered 2023-02-15 – 2023-02-16 (×3): 625 mg via ORAL
  Filled 2023-02-15 (×3): qty 1

## 2023-02-15 MED ORDER — ACETAMINOPHEN 650 MG RE SUPP: 650.0000 mg | Freq: Four times a day (QID) | RECTAL | Status: DC | PRN

## 2023-02-15 MED ORDER — VITAMIN B-12 1000 MCG PO TABS
1000.0000 ug | ORAL_TABLET | Freq: Every day | ORAL | Status: DC
  Administered 2023-02-15 – 2023-02-16 (×2): 1000 ug via ORAL
  Filled 2023-02-15 (×2): qty 1

## 2023-02-15 NOTE — Plan of Care (Signed)
  Problem: Clinical Measurements: Goal: Cardiovascular complication will be avoided Outcome: Progressing   Problem: Education: Goal: Knowledge of General Education information will improve Description: Including pain rating scale, medication(s)/side effects and non-pharmacologic comfort measures Outcome: Adequate for Discharge   Problem: Health Behavior/Discharge Planning: Goal: Ability to manage health-related needs will improve Outcome: Adequate for Discharge   Problem: Clinical Measurements: Goal: Ability to maintain clinical measurements within normal limits will improve Outcome: Adequate for Discharge Goal: Will remain free from infection Outcome: Adequate for Discharge Goal: Diagnostic test results will improve Outcome: Adequate for Discharge   Problem: Activity: Goal: Risk for activity intolerance will decrease Outcome: Adequate for Discharge   Problem: Nutrition: Goal: Adequate nutrition will be maintained Outcome: Adequate for Discharge   Problem: Coping: Goal: Level of anxiety will decrease Outcome: Adequate for Discharge   Problem: Elimination: Goal: Will not experience complications related to bowel motility Outcome: Adequate for Discharge Goal: Will not experience complications related to urinary retention Outcome: Adequate for Discharge   Problem: Safety: Goal: Ability to remain free from injury will improve Outcome: Adequate for Discharge   Problem: Skin Integrity: Goal: Risk for impaired skin integrity will decrease Outcome: Adequate for Discharge

## 2023-02-15 NOTE — Care Management Obs Status (Signed)
MEDICARE OBSERVATION STATUS NOTIFICATION   Patient Details  Name: Rachel Bates MRN: [id number] Date of Birth: March 19, 1946   Medicare Observation Status Notification Given:  Yes    MahabirOlegario Messier, RN 02/15/2023, 3:57 PM

## 2023-02-15 NOTE — Progress Notes (Signed)
[facility] Progress Note  Patient: Rachel Bates    ZOX:[id number]  DOA: 02/14/2023     Date of Service: the patient was seen and examined on 02/15/2023  Chief Complaint  Patient presents with   Chest Pain   Hypotension   Brief hospital course: Rachel Bates is a 77 y.o. 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  Patient was in a usual state of health until this morning she reports the onset of central chest pain described as stabbing while eating breakfast, worse with inspiration.  Associated nausea but no vomiting or diaphoresis.  Pain was unrelieved with Pepcid.  Otherwise there has been no change in status. Patient has been taking medication as prescribed and there has been no recent change in medication or diet.  No recent antibiotics.  There has been no recent illness, hospitalizations, travel or sick contacts.     EMS/ED Course: Patient received morphine, oxycodone, Rocephin, Azithro, Pepcid, Protonix, Maalox, Zofran. Medical admission has been requested for further management of Chest pain, CAP.   Assessment and Plan: # Bilateral lower lobe and lingular Community Acquired Pneumonia -Continue IV Rocephin & Azithromycin per pharmacy - s/p IV fluid hydration COVID and RVP negative - Duonebs, expectorants & O2 therapy as needed     # Chest pain, due to angina  History of CAD stable stents  Troponin negative Continue to monitor on telemetry Continue as needed medical pain control Continue nitroglycerin as needed Continue aspirin and statin TTE shows LVEF 50% slightly low systolic function, wall motion abnormality. Follow cardiology for further recommendation    # Mild hypokalemia - Replace orally   #. Pulm  nodule.  Patient was recommended to follow with an outpatient for further workup    #. History of Ischemic cardiomyopathy - Continue Coreg, ramipril, simvastatin BNP 322 slightly elevated  #. History of PAF - Continue Eliquis,    #. History of GERD - Continue Protonix   #. History of gout          - Continue allopurinol   #. History of anxiety/depresson - Continue Venlafaxine   #. History of lymphoma - Continue prednisone    Body mass index is 23.49 kg/m.  Interventions:  Diet: Heart healthy diet DVT Prophylaxis: Therapeutic Anticoagulation with Eliquis    Advance goals of care discussion: Full code  Family Communication: family was not present at bedside, at the time of interview.  The pt provided permission to discuss medical plan with the family. Opportunity was given to ask question and all questions were answered satisfactorily.   Disposition:  Pt is from Home, admitted with chest pain, pneumonia, still on IV antibiotics, needs cardiology clearance, which precludes a safe discharge. Discharge to home, when cleared by cardiology.  Subjective: No significant events overnight, patient still has chest pain and requiring opiates for resolution.  Denies any worsening of shortness of breath, saturating well on room air.  Mild cough.  Physical Exam: General: NAD, lying comfortably Appear in no distress, affect appropriate Eyes: PERRLA ENT: Oral Mucosa Clear, moist  Neck: no JVD,  Cardiovascular: S1 and S2 Present, no Murmur,  Respiratory: good respiratory effort, Bilateral Air entry equal and Decreased, no Crackles, no wheezes Abdomen: Bowel Sound present, Soft and no tenderness,  Skin: no  rashes Extremities: no Pedal edema, no calf tenderness Neurologic: without any new focal findings Gait not checked due to patient safety concerns  Vitals:   02/15/23 0857 02/15/23 1018 02/15/23 1600 02/15/23 1601  BP: (!) 110/53 (!) 104/48 (!) 111/50 (!) 111/50  Pulse: 74 73  68   Resp: 19 16  15   Temp: 98.3 F (36.8 C) 98.4 F (36.9 C)  98.2 F (36.8 C)  TempSrc: Oral Oral  Oral  SpO2: 97% 95%  97%  Weight:      Height:        Intake/Output Summary (Last 24 hours) at 02/15/2023 1734 Last data filed at 02/14/2023 2111 Gross per 24 hour  Intake 100 ml  Output --  Net 100 ml   Filed Weights   02/14/23 1323  Weight: 68 kg    Data Reviewed: I have personally reviewed and interpreted daily labs, tele strips, imagings as discussed above. I reviewed all nursing notes, pharmacy notes, vitals, pertinent old records I have discussed plan of care as described above with RN and patient/family.  CBC: Recent Labs  Lab 02/12/23 0902 02/14/23 1333 02/15/23 0530  WBC 4.7 8.2 5.1  NEUTROABS 3.8  --   --   HGB 12.1 11.0* 9.6*  HCT 36.0 35.7* 30.7*  MCV 95.5 99.7 99.0  PLT 114* 117* 93*   Basic Metabolic Panel: Recent Labs  Lab 02/12/23 0902 02/14/23 1333 02/15/23 0530  NA 141 137 139  K 3.5 3.3* 3.4*  CL 109 106 105  CO2 26 23 26   GLUCOSE 111* 131* 99  BUN 14 20 16   CREATININE 0.73 0.60 0.47  CALCIUM 9.1 8.8* 8.8*  MG  --   --  2.0  PHOS  --   --  3.6    Studies: ECHOCARDIOGRAM COMPLETE  Result Date: 02/15/2023    ECHOCARDIOGRAM REPORT   Patient Name:   Rachel Bates Date of Exam: 02/15/2023 Medical Rec #:  [id number]       Height:       67.0 in Accession #:    [id number]      Weight:       150.0 lb Date of Birth:  05-04-1946       BSA:          1.790 m Patient Age:    76 years        BP:           104/48 mmHg Patient Gender: F               HR:           72 bpm. Exam Location:  Inpatient Procedure: 2D Echo, Cardiac Doppler, Color Doppler and Intracardiac            Opacification Agent STAT ECHO Indications:     Chest pain  History:         Patient has prior history of Echocardiogram examinations, most                  recent 11/08/2022. Cardiomyopathy and CHF, Previous Myocardial                  Infarction and CAD, Arrythmias:Atrial Fibrillation,                   Signs/Symptoms:Chest Pain; Risk Factors:Dyslipidemia, Former                  Smoker, Sleep Apnea and Hypertension. Hx PE, hx DVT, lymphoma.  Sonographer:     Wallie Char Referring Phys:  [id number] Tonye Royalty Diagnosing Phys: Weston Brass MD IMPRESSIONS  1. Left ventricular ejection fraction, by estimation, is 50%. The left ventricle has low normal function. The left ventricle demonstrates regional wall motion abnormalities unchanged in location from prior study on side by side review. Left ventricular diastolic parameters are consistent with Grade I diastolic dysfunction (impaired relaxation).  2. Right ventricular systolic function is normal. The right ventricular size is normal. There is mildly elevated pulmonary artery systolic pressure. The estimated right ventricular systolic pressure is 42.9 mmHg.  3. The mitral valve is grossly normal. Trivial mitral valve regurgitation. No evidence of mitral stenosis.  4. The aortic valve is normal in structure. Aortic valve regurgitation is not visualized. No aortic stenosis is present.  5. The inferior vena cava is dilated in size with <50% respiratory variability, suggesting right atrial pressure of 15 mmHg. Conclusion(s)/Recommendation(s): No left ventricular mural or apical thrombus/thrombi. FINDINGS  Left Ventricle: Left ventricular ejection fraction, by estimation, is 50%. The left ventricle has low normal function. The left ventricle demonstrates regional wall motion abnormalities. Definity contrast agent was given IV to delineate the left ventricular endocardial borders. The left ventricular internal cavity size was normal in size. There is borderline left ventricular hypertrophy. Left ventricular diastolic parameters are consistent with Grade I diastolic dysfunction (impaired relaxation).  LV Wall Scoring: The mid and distal inferior wall and apical septal segment are hypokinetic. Right Ventricle: The right ventricular size is normal. No  increase in right ventricular wall thickness. Right ventricular systolic function is normal. There is mildly elevated pulmonary artery systolic pressure. The tricuspid regurgitant velocity is 2.64  m/s, and with an assumed right atrial pressure of 15 mmHg, the estimated right ventricular systolic pressure is 42.9 mmHg. Left Atrium: Left atrial size was normal in size. Right Atrium: Right atrial size was normal in size. Pericardium: There is no evidence of pericardial effusion. Mitral Valve: The mitral valve is grossly normal. Trivial mitral valve regurgitation. No evidence of mitral valve stenosis. MV peak gradient, 2.2 mmHg. The mean mitral valve gradient is 1.0 mmHg. Tricuspid Valve: The tricuspid valve is grossly normal. Tricuspid valve regurgitation is mild . No evidence of tricuspid stenosis. Aortic Valve: The aortic valve is normal in structure. Aortic valve regurgitation is not visualized. No aortic stenosis is present. Aortic valve mean gradient measures 3.0 mmHg. Aortic valve peak gradient measures 5.3 mmHg. Aortic valve area, by VTI measures 2.61 cm. Pulmonic Valve: The pulmonic valve was grossly normal. Pulmonic valve regurgitation is trivial. Aorta: The aortic root is normal in size and structure. Venous: The inferior vena cava was not well visualized. The inferior vena cava is dilated in size with less than 50% respiratory variability, suggesting right atrial pressure of 15 mmHg. IAS/Shunts: The interatrial septum was not well visualized. Additional Comments: A device lead is visualized in the right atrium and right ventricle.  LEFT VENTRICLE PLAX 2D LVIDd:         5.00 cm      Diastology LVIDs:         3.70 cm      LV e' medial:    8.12 cm/s LV PW:         0.90 cm      LV E/e' medial:  7.8 LV IVS:        1.00 cm      LV e' lateral:   10.20 cm/s LVOT diam:     2.10 cm  LV E/e' lateral: 6.2 LV SV:         67 LV SV Index:   37 LVOT Area:     3.46 cm  LV Volumes (MOD) LV vol d, MOD A2C: 113.0 ml LV  vol d, MOD A4C: 138.0 ml LV vol s, MOD A2C: 62.1 ml LV vol s, MOD A4C: 62.7 ml LV SV MOD A2C:     50.9 ml LV SV MOD A4C:     138.0 ml LV SV MOD BP:      64.2 ml RIGHT VENTRICLE             IVC RV Basal diam:  3.20 cm     IVC diam: 2.30 cm RV S prime:     15.30 cm/s TAPSE (M-mode): 2.0 cm LEFT ATRIUM             Index        RIGHT ATRIUM           Index LA diam:        4.20 cm 2.35 cm/m   RA Area:     13.80 cm LA Vol (A2C):   47.3 ml 26.43 ml/m  RA Volume:   30.40 ml  16.99 ml/m LA Vol (A4C):   37.6 ml 21.01 ml/m LA Biplane Vol: 43.3 ml 24.20 ml/m  AORTIC VALVE AV Area (Vmax):    2.93 cm AV Area (Vmean):   2.64 cm AV Area (VTI):     2.61 cm AV Vmax:           115.00 cm/s AV Vmean:          82.450 cm/s AV VTI:            0.256 m AV Peak Grad:      5.3 mmHg AV Mean Grad:      3.0 mmHg LVOT Vmax:         97.25 cm/s LVOT Vmean:        62.850 cm/s LVOT VTI:          0.193 m LVOT/AV VTI ratio: 0.75  AORTA Ao Root diam: 3.50 cm Ao Asc diam:  3.40 cm MITRAL VALVE               TRICUSPID VALVE MV Area (PHT): 3.00 cm    TR Peak grad:   27.9 mmHg MV Area VTI:   2.84 cm    TR Vmax:        264.00 cm/s MV Peak grad:  2.2 mmHg MV Mean grad:  1.0 mmHg    SHUNTS MV Vmax:       0.74 m/s    Systemic VTI:  0.19 m MV Vmean:      51.1 cm/s   Systemic Diam: 2.10 cm MV Decel Time: 253 msec MV E velocity: 63.40 cm/s MV A velocity: 79.60 cm/s MV E/A ratio:  0.80 Weston Brass MD Electronically signed by Weston Brass MD Signature Date/Time: 02/15/2023/3:00:43 PM    Final (Updated)     Scheduled Meds:  acyclovir  400 mg Oral BID   allopurinol  150 mg Oral Daily   apixaban  5 mg Oral BID   aspirin EC  81 mg Oral Daily   calcium-vitamin D  1 tablet Oral Daily   carvedilol  12.5 mg Oral BID WC   cholecalciferol  5,000 Units Oral Daily   cyanocobalamin  1,000 mcg Oral Daily   gabapentin  300 mg Oral TID   loratadine  10 mg Oral Daily  omega-3 acid ethyl esters  1,000 mg Oral Daily   pantoprazole  40 mg Oral BID    polycarbophil  625 mg Oral BID   potassium chloride  20 mEq Oral BID   ramipril  2.5 mg Oral Daily   simvastatin  40 mg Oral QHS   sodium chloride flush  3 mL Intravenous Q12H   venlafaxine  75 mg Oral Daily   vitamin E  400 Units Oral Daily   zonisamide  100 mg Oral QHS   zonisamide  50 mg Oral Daily   Continuous Infusions:  azithromycin     cefTRIAXone (ROCEPHIN)  IV     PRN Meds: acetaminophen **OR** acetaminophen, albuterol, bisacodyl, hydrALAZINE, HYDROcodone-acetaminophen, ipratropium, morphine injection, nitroGLYCERIN, [START ON 02/16/2023] ondansetron **OR** [START ON 02/16/2023] ondansetron (ZOFRAN) IV, senna-docusate, tiZANidine, traZODone  Time spent: 35 minutes  Author: Gillis Santa. MD [facility] 02/15/2023 5:34 PM  To reach On-call, see care teams to locate the attending and reach out to them via [email or web address]. If 7PM-7AM, please contact night-coverage If you still have difficulty reaching the attending provider, please page the [facility] (Director on Call) for [facility] on amion for assistance.

## 2023-02-15 NOTE — ED Notes (Signed)
ED TO INPATIENT HANDOFF REPORT  ED Nurse Name and Phone #: Gustavus Messing RN   S Name/Age/Gender Rachel Bates 77 y.o. female Room/Bed: WA20/WA20  Code Status   Code Status: Full Code  Home/SNF/Other Home Patient oriented to: self, place, time, and situation Is this baseline? Yes   Triage Complete: Triage complete  Chief Complaint CAP (community acquired pneumonia) [J18.9]  Triage Note Indigestion since 0940 that has been worsening. Pt took 2 pepcid PTA with no relief. Pt unable to take deep breath, states pain is a stabbing pain in center of chest. Denies radiation. C/o nausea. Hx of 2 stents, has defibrillator/pacemaker.    Allergies Allergies  Allergen Reactions   Sumatriptan Succinate Other (See Comments)    Chest pain, no triptans, pt states "it makes my heart race"   Amitriptyline Other (See Comments)    Weight gain    Level of Care/Admitting Diagnosis ED Disposition     ED Disposition  Admit   Condition  --   Comment  Hospital Area: [facility] [[id number]]  Level of Care: Telemetry [5]  Admit to tele based on following criteria: Monitor for Ischemic changes  May place patient in observation at [facility] or [facility] if equivalent level of care is available:: No  Covid Evaluation: Confirmed COVID Negative  Diagnosis: CAP (community acquired pneumonia) [161096]  Admitting Physician: Tonye Royalty [[id number]]  Attending Physician: Janann Colonel          B Medical/Surgery History Past Medical History:  Diagnosis Date   AICD (automatic cardioverter/defibrillator) present dual   [employer] ---  original placedment 2007/  generator change 2014 by dr gregg taylor   Anemia    Anticoagulant long-term use    eliquis   Anxiety    Arthralgia of multiple joints    Arthritis pain    Benign hypertensive heart disease    CAD (coronary artery disease) primary cardiologist-- dr gregg taylor   MI and 2 stents 1995 in [place]  [place]   Cancer [facility])    CHF NYHA class II, chronic, systolic (HCC)    followed by dr Sharlot Gowda taylor   Degenerative disc disease, lumbar    Depression    Dyslipidemia    History of basal cell carcinoma (BCC) excision    right ankle area s/p  excision in office 06/ 2019  in office   History of DVT of lower extremity yrs ago before 2012   History of MI (myocardial infarction) 1995  in [place]   History of pulmonary embolus (PE) 2012   History of ventricular tachycardia    Hyperlipidemia    Hypersomnia    Hypertension    Ischemic cardiomyopathy    Migraines    Myocardial infarction (HCC)    OA (osteoarthritis)    "all over"   Obstructive apnea    PAF (paroxysmal atrial fibrillation) (HCC)    Primary localized osteoarthritis of right hip 12/18/2017   Primary localized osteoarthritis of right knee 05/28/2018   S/P coronary artery stent placement 1995   in [facility]   05-21-2018 per pt x2  stents in same coronary artery (unsure BM or DES)   Vitamin D deficiency disease    Past Surgical History:  Procedure Laterality Date   BIOPSY  06/19/2022   Procedure: BIOPSY;  Surgeon: Kathi Der, MD;  Location: [facility];  Service: Gastroenterology;;   CARDIAC DEFIBRILLATOR PLACEMENT  2007   CATARACT EXTRACTION, BILATERAL     COLONOSCOPY  04/14/2013   colonic polyp, status  post polypectomy. Mild panocolonic diverticulosis. Small internal hemorrhoids   DILATION AND CURETTAGE OF UTERUS  yrs ago   DIRECT LARYNGOSCOPY Right 10/04/2022   Procedure: DIRECT LARYNGOSCOPY;  Surgeon: Cheron Schaumann A, DO;  Location: [facility];  Service: ENT;  Laterality: Right;   ESOPHAGOGASTRODUODENOSCOPY (EGD) WITH PROPOFOL N/A 06/19/2022   Procedure: ESOPHAGOGASTRODUODENOSCOPY (EGD) WITH PROPOFOL;  Surgeon: Kathi Der, MD;  Location: [facility];  Service: Gastroenterology;  Laterality: N/A;   IMPACTION REMOVAL  06/19/2022   Procedure: IMPACTION REMOVAL;  Surgeon: Kathi Der, MD;  [facility]: [facility];   Service: Gastroenterology;;   IMPLANTABLE CARDIOVERTER DEFIBRILLATOR GENERATOR CHANGE N/A 02/04/2013   Procedure: IMPLANTABLE CARDIOVERTER DEFIBRILLATOR GENERATOR CHANGE;  Surgeon: Marinus Maw, MD;  Location[facility] C[facility] LAB;  Service: Cardiovascular;  Laterality: N/A;   IR IMAGING GUIDED PORT INSERTION  10/27/2022   LYMPH NODE BIOPSY Right 10/04/2022   Procedure: EXCISIONAL OF RIGHT DEEP CERVICAL LYMPH NODE;  Surgeon: Laren Boom, DO;  Location: [facility];  Service: ENT;  Laterality: Right;   SHOULDER ARTHROSCOPY Right 2015   TOTAL HIP ARTHROPLASTY Right 12/18/2017   Procedure: RIGHT TOTAL HIP ARTHROPLASTY;  Surgeon: Teryl Lucy, MD;  Location: [facility];  Service: Orthopedics;  Laterality: Right;   TOTAL HIP ARTHROPLASTY Left 04/19/2021   Procedure: TOTAL HIP ARTHROPLASTY;  Surgeon: Teryl Lucy, MD;  Location: [facility];  Service: Orthopedics;  Laterality: Left;   TOTAL KNEE ARTHROPLASTY Right 05/28/2018   Procedure: TOTAL KNEE ARTHROPLASTY;  Surgeon: Teryl Lucy, MD;  Location: [facility];  Service: Orthopedics;  Laterality: Right;  Adductor Block   TOTAL KNEE ARTHROPLASTY Left 08/12/2021   Procedure: TOTAL KNEE ARTHROPLASTY;  Surgeon: Teryl Lucy, MD;  Location: [facility];  Service: Orthopedics;  Laterality: Left;   TUBAL LIGATION Bilateral yrs ago   WISDOM TOOTH EXTRACTION       A IV Location/Drains/Wounds Patient Lines/Drains/Airways Status     Active Line/Drains/Airways     Name Placement date Placement time Site Days   Implanted Port Right Chest --  --  Chest  --   Peripheral IV 02/14/23 20 G Left;Posterior Hand 02/14/23  1508  Hand  1   Peripheral IV 02/14/23 20 G Right Forearm 02/14/23  1533  Forearm  1            Intake/Output Last 24 hours  Intake/Output Summary (Last 24 hours) at 02/15/2023 0110 Last data filed at 02/14/2023 2111 Gross per 24 hour  Intake 100 ml  Output --  Net 100 ml    Labs/Imaging Results for orders placed or performed during the hospital  encounter of 02/14/23 (from the past 48 hour(s))  Basic metabolic panel     Status: Abnormal   Collection Time: 02/14/23  1:33 PM  Result Value Ref Range   Sodium 137 135 - 145 mmol/L   Potassium 3.3 (L) 3.5 - 5.1 mmol/L   Chloride 106 98 - 111 mmol/L   CO2 23 22 - 32 mmol/L   Glucose, Bld 131 (H) 70 - 99 mg/dL    Comment: Glucose reference range applies only to samples taken after fasting for at least 8 hours.   BUN 20 8 - 23 mg/dL   Creatinine, Ser 1.61 0.44 - 1.00 mg/dL   Calcium 8.8 (L) 8.9 - 10.3 mg/dL   GFR, Estimated >09 >60 mL/min    Comment: (NOTE) Calculated using the CKD-EPI Creatinine Equation (2021)    Anion gap 8 5 - 15    Comment: Performed at [facility], [address] [place], [place] [address]  CBC     Status: Abnormal   Collection Time: 02/14/23  1:33 PM  Result Value Ref Range   WBC 8.2 4.0 - 10.5 K/uL   RBC 3.58 (L) 3.87 - 5.11 MIL/uL   Hemoglobin 11.0 (L) 12.0 - 15.0 g/dL   HCT 34.7 (L) 42.5 - 95.6 %   MCV 99.7 80.0 - 100.0 fL   MCH 30.7 26.0 - 34.0 pg   MCHC 30.8 30.0 - 36.0 g/dL   RDW 38.7 56.4 - 33.2 %   Platelets 117 (L) 150 - 400 K/uL   nRBC 0.0 0.0 - 0.2 %    Comment: Performed at [facility], [address] [place], [place] [address]  Troponin I (High Sensitivity)     Status: None   Collection Time: 02/14/23  1:33 PM  Result Value Ref Range   Troponin I (High Sensitivity) 2 <18 ng/L    Comment: (NOTE) Elevated high sensitivity troponin I (hsTnI) values and significant  changes across serial measurements may suggest ACS but many other  chronic and acute conditions are known to elevate hsTnI results.  Refer to the "Links" section for chest pain algorithms and additional  guidance. Performed at [facility], [address] [place], [place] [address]   Lipase, blood     Status: None   Collection Time: 02/14/23  1:33 PM  Result Value Ref Range   Lipase 31 11 - 51 U/L    Comment:  Performed at [facility], [address] [place], [place] [address]  Hepatic function panel     Status: Abnormal   Collection Time: 02/14/23  1:33 PM  Result Value Ref Range   Total Protein 6.2 (L) 6.5 - 8.1 g/dL   Albumin 3.8 3.5 - 5.0 g/dL   AST 22 15 - 41 U/L   ALT 21 0 - 44 U/L   Alkaline Phosphatase 67 38 - 126 U/L   Total Bilirubin 1.0 0.3 - 1.2 mg/dL   Bilirubin, Direct 0.2 0.0 - 0.2 mg/dL   Indirect Bilirubin 0.8 0.3 - 0.9 mg/dL    Comment: Performed at [facility], [address], [place], [place] [address]  SARS Coronavirus 2 by RT PCR (hospital order, performed in [facility] hospital lab) *cepheid single result test* Anterior Nasal Swab     Status: None   Collection Time: 02/15/23 12:03 AM   Specimen: Anterior Nasal Swab  Result Value Ref Range   SARS Coronavirus 2 by RT PCR NEGATIVE NEGATIVE    Comment: (NOTE) SARS-CoV-2 target nucleic acids are NOT DETECTED.  The SARS-CoV-2 RNA is generally detectable in upper and lower respiratory specimens during the acute phase of infection. The lowest concentration of SARS-CoV-2 viral copies this assay can detect is 250 copies / mL. A negative result does not preclude SARS-CoV-2 infection and should not be used as the sole basis for treatment or other patient management decisions.  A negative result may occur with improper specimen collection / handling, submission of specimen other than nasopharyngeal swab, presence of viral mutation(s) within the areas targeted by this assay, and inadequate number of viral copies (<250 copies / mL). A negative result must be combined with clinical observations, patient history, and epidemiological information.  Fact Sheet for Patients:   RoadLapTop.co.za  Fact Sheet for Healthcare Providers: http://kim-miller.com/  This test is not yet approved or  cleared by the [place] FDA and has been authorized  for  detection and/or diagnosis of SARS-CoV-2 by FDA under an Emergency Use Authorization (EUA).  This EUA will remain in effect (meaning this test can be used) for the duration of the COVID-19 declaration under Section 564(b)(1) of the Act, 21 U.S.C. section 360bbb-3(b)(1), unless the authorization is terminated or revoked sooner.  Performed at [facility], [address], [place], [place] [address]    CT Angio Chest PE W and/or Wo Contrast  Result Date: 02/14/2023 CLINICAL DATA:  Indigestion, chest pain, history of diffuse large B-cell lymphoma EXAM: CT ANGIOGRAPHY CHEST WITH CONTRAST TECHNIQUE: Multidetector CT imaging of the chest was performed using the standard protocol during bolus administration of intravenous contrast. Multiplanar CT image reconstructions and MIPs were obtained to evaluate the vascular anatomy. RADIATION DOSE REDUCTION: This exam was performed according to the departmental dose-optimization program which includes automated exposure control, adjustment of the mA and/or kV according to patient size and/or use of iterative reconstruction technique. CONTRAST:  75mL OMNIPAQUE IOHEXOL 350 MG/ML SOLN COMPARISON:  PET-CT dated 12/25/2022 FINDINGS: Cardiovascular: Satisfactory opacification of the bilateral pulmonary arteries to the segmental level. No evidence of pulmonary embolism. Although not tailored for evaluation of the thoracic aorta, there is no evidence of thoracic aortic aneurysm or dissection. Atherosclerotic calcifications of the aortic arch. Cardiomegaly.  No pericardial effusion.  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 Right chest port terminates at the cavoatrial junction. Mediastinum/Nodes: No suspicious mediastinal lymphadenopathy. Visualized thyroid is unremarkable. Lungs/Pleura: Evaluation of the lung parenchyma is constrained by respiratory motion. Within that constraint, there are  clustered posterior left upper lobe nodules measuring up to 7 mm (series 12/image 46), new from PET, likely reflecting mild infection/inflammation. Superimposed patchy opacities in the lingula and bilateral lower lobes, suggesting pneumonia. 10 mm solid nodule in the central right upper lobe (series 12/image 51), non FDG avid on prior PET but favored to be mildly progressive on the current study (previously measured as 5 mm but might measure as much as 8 mm in retrospect). No pleural effusion or pneumothorax. Upper Abdomen: Visualized upper abdomen is grossly unremarkable. Musculoskeletal: Visualized osseous structures are within normal limits. Review of the MIP images confirms the above findings. IMPRESSION: No evidence of pulmonary embolism. Lingular and bilateral lower lobe pneumonia. 10 mm central right upper lobe nodule, mildly progressive. Early primary bronchogenic carcinoma is possible. Follow-up CT chest is suggested in 3 months. Electronically Signed   By: Charline Bills M.D.   On: 02/14/2023 17:28   DG Chest Port 1 View  Result Date: 02/14/2023 CLINICAL DATA:  Chest pain. EXAM: PORTABLE CHEST 1 VIEW COMPARISON:  September 26, 2016. FINDINGS: The heart size and mediastinal contours are within normal limits. Left-sided defibrillator is unchanged. Right internal jugular Port-A-Cath is now noted with distal tip in expected position of cavoatrial junction. Hypoinflation of the lungs is noted with mild bibasilar subsegmental atelectasis. The visualized skeletal structures are unremarkable. IMPRESSION: Hypoinflation of the lungs with mild bibasilar subsegmental atelectasis. Electronically Signed   By: Lupita Raider M.D.   On: 02/14/2023 15:11    Pending Labs Unresulted Labs (From admission, onward)     Start     Ordered   02/14/23 2347  Respiratory (~20 pathogens) panel by PCR  (Respiratory panel by PCR (~20 pathogens, ~24 hr TAT)  w precautions)  ONCE - URGENT,   URGENT        02/14/23 2346   Signed  and Held  Comprehensive metabolic panel  Tomorrow  morning,   R        Signed and Held   Signed and Held  CBC  Tomorrow morning,   R        Signed and Held   Signed and Held  Magnesium  Once,   R        Signed and Held   Signed and Held  Phosphorus  Once,   R        Signed and Held   Signed and Held  TSH  Once,   R        Signed and Held   Signed and Held  Brain natriuretic peptide  Once,   R        Signed and Held   Signed and Held  Expectorated Sputum Assessment w Gram Stain, Rflx to [employer]  Once,   R       Question:  Patient immune status  Answer:  Immunocompromised   Signed and Held            Vitals/Pain Today's Vitals   02/14/23 2245 02/14/23 2300 02/15/23 0044 02/15/23 0107  BP: 100/67 (!) 108/54    Pulse: 76 71    Resp: 18 16    Temp:    98.5 F (36.9 C)  TempSrc:    Oral  SpO2: 100% 94%    Weight:      Height:      PainSc:   4  9     Isolation Precautions Droplet precaution  Medications Medications  HYDROcodone-acetaminophen (NORCO/VICODIN) 5-325 MG per tablet 1-2 tablet (2 tablets Oral Given 02/15/23 0107)  morphine (PF) 2 MG/ML injection 1 mg (1 mg Intravenous Given 02/15/23 0001)  ondansetron (ZOFRAN) injection 4 mg (4 mg Intravenous Given 02/14/23 1509)  morphine (PF) 4 MG/ML injection 4 mg (4 mg Intravenous Given 02/14/23 1508)  iohexol (OMNIPAQUE) 350 MG/ML injection 75 mL (75 mLs Intravenous Contrast Given 02/14/23 1649)  morphine (PF) 4 MG/ML injection 4 mg (4 mg Intravenous Given 02/14/23 1707)  pantoprazole (PROTONIX) injection 40 mg (40 mg Intravenous Given 02/14/23 1926)  alum & mag hydroxide-simeth (MAALOX/MYLANTA) 200-200-20 MG/5ML suspension 30 mL (30 mLs Oral Given 02/14/23 1925)  famotidine (PEPCID) tablet 40 mg (40 mg Oral Given 02/14/23 1920)  oxyCODONE-acetaminophen (PERCOCET/ROXICET) 5-325 MG per tablet 1 tablet (1 tablet Oral Given 02/14/23 1920)  cefTRIAXone (ROCEPHIN) 1 g in sodium chloride 0.9 % 100 mL IVPB (0 g Intravenous Stopped 02/14/23 2111)   azithromycin (ZITHROMAX) tablet 500 mg (500 mg Oral Given 02/14/23 1920)    Mobility walks     Focused Assessments Cardiac Assessment Handoff:    No results found for: "CKTOTAL", "CKMB", "CKMBINDEX", "TROPONINI" No results found for: "DDIMER" Does the Patient currently have chest pain? Yes    R Recommendations: See Admitting Provider Note  Report given to:   Additional Notes: n/a

## 2023-02-15 NOTE — TOC Initial Note (Signed)
Transition of Care [facility]) - Initial/Assessment Note    Patient Details  Name: Rachel Bates MRN: [id number] Date of Birth: 1945-10-31  [facility]) CM/SW Contact:    Lanier Clam, RN Phone Number: 02/15/2023, 3:53 PM  Clinical Narrative:   follow for d/c plans.                Expected Discharge Plan: Home/Self Care Barriers to Discharge: Continued Medical Work up   Patient Goals and CMS Choice Patient states their goals for this hospitalization and ongoing recovery are:: Home          Expected Discharge Plan and Services   Discharge Planning Services: CM Consult   Living arrangements for the past 2 months: Single Family Home                                      Prior Living Arrangements/Services Living arrangements for the past 2 months: Single Family Home Lives with:: Self                   Activities of Daily Living Home Assistive Devices/Equipment: None ADL Screening (condition at time of admission) Patient's cognitive ability adequate to safely complete daily activities?: Yes Is the patient deaf or have difficulty hearing?: No Does the patient have difficulty seeing, even when wearing glasses/contacts?: No Does the patient have difficulty concentrating, remembering, or making decisions?: No Patient able to express need for assistance with ADLs?: Yes Does the patient have difficulty dressing or bathing?: No Independently performs ADLs?: Yes (appropriate for developmental age) Does the patient have difficulty walking or climbing stairs?: No Weakness of Legs: None Weakness of Arms/Hands: None  Permission Sought/Granted                  Emotional Assessment              Admission diagnosis:  CAP (community acquired pneumonia) [J18.9] Patient Active Problem List   Diagnosis Date Noted   CAP (community acquired pneumonia) 02/14/2023   Chest pain on breathing 02/14/2023   Hypokalemia 02/14/2023   Port-A-Cath in place  11/15/2022   Diffuse large B-cell lymphoma of lymph nodes of multiple regions (HCC) 10/19/2022   Goals of care, counseling/discussion 10/19/2022   Neck mass 10/04/2022   S/P TKR (total knee replacement), left 08/12/2021   Osteoarthritis of left knee 07/12/2021   S/P hip replacement, left 04/19/2021   S/P total knee arthroplasty 05/29/2018   Primary localized osteoarthritis of right knee 05/28/2018   S/P knee replacement 05/28/2018   Primary localized osteoarthritis of right hip 12/18/2017   Osteoarthritis of left hip 12/18/2017   History of DVT (deep vein thrombosis) 10/01/2017   Paroxysmal atrial fibrillation (HCC) 10/01/2017   Chronic systolic heart failure (HCC) 12/08/2014   Dizziness 04/02/2013   Congestive heart failure, unspecified 03/26/2012   Pulmonary embolism (HCC) 12/30/2010   Automatic implantable cardioverter-defibrillator in situ 01/20/2009   DYSLIPIDEMIA 01/15/2009   MYOCARDIAL INFARCTION 01/15/2009   Ischemic cardiomyopathy 01/15/2009   VENTRICULAR TACHYCARDIA 01/15/2009   PCP:  Eunice Blase, PA-C Pharmacy:   [id number] [place], [place] - [address] [place] [address] Phone: [phone] Fax: [phone]  [facility] [address] [place] [address] Phone: [phone] Fax: [phone]     Social Determinants of Health (SDOH) Social History: SDOH Screenings   Food Insecurity: No  Food Insecurity (02/15/2023)  Housing: Patient Declined (02/15/2023)  Transportation Needs: No Transportation Needs (02/15/2023)  Utilities: Not At Risk (02/15/2023)  Financial Resource Strain: Low Risk  (05/28/2018)  Tobacco Use: Medium Risk (02/14/2023)   SDOH Interventions:     Readmission Risk Interventions     No data to display

## 2023-02-15 NOTE — Plan of Care (Signed)
  Problem: Health Behavior/Discharge Planning: Goal: Ability to manage health-related needs will improve Outcome: Progressing   Problem: Activity: Goal: Risk for activity intolerance will decrease Outcome: Progressing   Problem: Safety: Goal: Ability to remain free from injury will improve Outcome: Progressing   

## 2023-02-15 NOTE — Telephone Encounter (Signed)
Patient wanted to report she has been admitted to [facility] and they will be sending her for a echocardiogram test.

## 2023-02-16 ENCOUNTER — Inpatient Hospital Stay: Payer: Medicare HMO

## 2023-02-16 ENCOUNTER — Encounter (HOSPITAL_COMMUNITY): Payer: Self-pay | Admitting: Student

## 2023-02-16 DIAGNOSIS — T451X5A Adverse effect of antineoplastic and immunosuppressive drugs, initial encounter: Secondary | ICD-10-CM

## 2023-02-16 DIAGNOSIS — I480 Paroxysmal atrial fibrillation: Secondary | ICD-10-CM

## 2023-02-16 DIAGNOSIS — R0789 Other chest pain: Secondary | ICD-10-CM | POA: Diagnosis not present

## 2023-02-16 DIAGNOSIS — I2510 Atherosclerotic heart disease of native coronary artery without angina pectoris: Secondary | ICD-10-CM | POA: Diagnosis not present

## 2023-02-16 DIAGNOSIS — J189 Pneumonia, unspecified organism: Secondary | ICD-10-CM | POA: Diagnosis not present

## 2023-02-16 DIAGNOSIS — D61810 Antineoplastic chemotherapy induced pancytopenia: Secondary | ICD-10-CM | POA: Diagnosis not present

## 2023-02-16 DIAGNOSIS — C8338 Diffuse large B-cell lymphoma, lymph nodes of multiple sites: Secondary | ICD-10-CM | POA: Diagnosis not present

## 2023-02-16 MED ORDER — CEFDINIR 300 MG PO CAPS: 300.0000 mg | ORAL_CAPSULE | Freq: Two times a day (BID) | ORAL | 0 refills | Status: AC

## 2023-02-16 MED ORDER — HEPARIN SOD (PORK) LOCK FLUSH 100 UNIT/ML IV SOLN
500.0000 [IU] | INTRAVENOUS | Status: AC | PRN
  Administered 2023-02-16: 500 [IU]

## 2023-02-16 MED ORDER — AZITHROMYCIN 250 MG PO TABS: ORAL_TABLET | ORAL | 0 refills | Status: DC

## 2023-02-16 MED ORDER — APIXABAN 5 MG PO TABS
5.0000 mg | ORAL_TABLET | Freq: Two times a day (BID) | ORAL | Status: DC
Start: 2023-02-16 — End: 2023-03-29

## 2023-02-16 NOTE — Hospital Course (Signed)
77 y.o. 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  Patient was in a usual state of health until this morning she reports the onset of central chest pain described as stabbing while eating breakfast, worse with inspiration.  Associated nausea but no vomiting or diaphoresis.  Pain was unrelieved with Pepcid.  Otherwise there has been no change in status. Patient has been taking medication as prescribed and there has been no recent change in medication or diet.  No recent antibiotics.  There has been no recent illness, hospitalizations, travel or sick contacts.

## 2023-02-16 NOTE — Discharge Summary (Signed)
Physician Discharge Summary   Patient: Rachel Bates DOB: 10-Jun-1946  Admit date:     02/14/2023  Discharge date: 02/16/23  Discharge Physician: Rickey Barbara   PCP: Eunice Blase, PA-C   Recommendations at discharge:    Follow up with PCP in 1-2 weeks Follow up with Oncology as scheduled Follow up with Cardiology as scheduled  Discharge Diagnoses: Principal Problem:   CAP (community acquired pneumonia) Active Problems:   Chest pain on breathing   Hypokalemia   Chest pain  Resolved Problems:   * No resolved hospital problems. *  Hospital Course: 77 y.o. 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  Patient was in a usual state of health until this morning she reports the onset of central chest pain described as stabbing while eating breakfast, worse with inspiration.  Associated nausea but no vomiting or diaphoresis.  Pain was unrelieved with Pepcid.  Otherwise there has been no change in status. Patient has been taking medication as prescribed and there has been no recent change in medication or diet.  No recent antibiotics.  There has been no recent illness, hospitalizations, travel or sick contacts.   Assessment and Plan: # Bilateral lower lobe and lingular Community Acquired Pneumonia -Continued on IV Rocephin & Azithromycin per pharmacy, to complete course of omnicef and po azithro on d/c -COVID and RVP negative - Given PRN Duonebs, expectorants & O2 therapy as needed    # Chest pain, due to angina  History of CAD stable stents  Troponin negative TTE shows LVEF 50% slightly low systolic function, wall motion abnormality. Follow cardiology for further recommendation -Cardiology consulted. Chest  pain was noted to be atypical with no further workup recommended. Pt to f/u with Dr. Ladona Ridgel as outpt    # Mild hypokalemia - Replaced   #. Pulm nodule.  Patient was recommended to follow with an outpatient for further workup    #. History of Ischemic cardiomyopathy - Continue Coreg, ramipril, simvastatin   #. History of PAF - Continue Eliquis,    #. History of GERD - Continue Protonix   #. History of gout          - Continue allopurinol   #. History of anxiety/depresson - Continue Venlafaxine   #. History of lymphoma - Continue prednisone -Pt to follow up with Oncology as outpatient for scheduled treatment. Discussed with Dr. Candise Che    Consultants: Cardiology, discussed with Oncology Procedures performed:   Disposition: Home Diet recommendation:  Regular diet DISCHARGE MEDICATION: Allergies as of 02/16/2023       Reactions   Sumatriptan Succinate Other (See Comments)   Chest pain, no triptans, pt states "it makes my heart race"   Amitriptyline Other (See Comments)   Weight gain        Medication List     STOP taking these medications    lidocaine-prilocaine cream Commonly known as: EMLA   ondansetron 8 MG tablet Commonly known as: Zofran   prochlorperazine 10 MG tablet Commonly known as: COMPAZINE       TAKE these medications    acetaminophen 650 MG CR tablet Commonly known as: TYLENOL Take 1,300 mg by mouth every 8 (eight) hours as needed for pain.   acyclovir 400 MG tablet Commonly known  as: ZOVIRAX TAKE 1 TABLET BY MOUTH TWICE A DAY   allopurinol 300 MG tablet Commonly known as: ZYLOPRIM TAKE 0.5 TABLETS BY MOUTH DAILY.   apixaban 5 MG Tabs tablet Commonly known as: Eliquis Take 1 tablet (5 mg total) by mouth 2 (two) times daily. What changed: how much to take   azithromycin 250 MG tablet Commonly known as: Zithromax 1 tab po daily x 4 more days, zero refills   b complex vitamins capsule Take 1 capsule by mouth daily.   CALCIUM  600 + D PO Take 1 tablet by mouth daily.   carvedilol 12.5 MG tablet Commonly known as: COREG Take 12.5 mg by mouth 2 (two) times daily with a meal.   cefdinir 300 MG capsule Commonly known as: OMNICEF Take 1 capsule (300 mg total) by mouth 2 (two) times daily for 4 days.   cyanocobalamin 1000 MCG tablet Commonly known as: VITAMIN B12 Take 1,000 mcg by mouth daily.   FIBER PO Take 1 capsule by mouth 2 (two) times daily.   Fiber Powd Take 1 Scoop by mouth daily.   Fish Oil 1000 MG Caps Take 1,000 mg by mouth daily.   gabapentin 300 MG capsule Commonly known as: NEURONTIN Take 300 mg by mouth 3 (three) times daily.   loratadine 10 MG tablet Commonly known as: CLARITIN Take 10 mg by mouth daily.   pantoprazole 40 MG tablet Commonly known as: Protonix Take 1 tablet (40 mg total) by mouth 2 (two) times daily.   predniSONE 20 MG tablet Commonly known as: DELTASONE Take 3 tablets (60 mg total) by mouth daily with breakfast. Take for 5 days now x 1 course and then for 5 days after each cycle of chemotherapy What changed: additional instructions   ramipril 2.5 MG tablet Commonly known as: ALTACE Take 2.5 mg by mouth daily.   simvastatin 40 MG tablet Commonly known as: ZOCOR Take 40 mg by mouth at bedtime.   tiZANidine 4 MG tablet Commonly known as: ZANAFLEX Take 2 mg by mouth daily as needed for muscle spasms.   traZODone 50 MG tablet Commonly known as: DESYREL Take 50 mg by mouth at bedtime.   venlafaxine 75 MG tablet Commonly known as: EFFEXOR Take 75 mg by mouth daily.   Vitamin D3 125 MCG (5000 UT) Caps Take 5,000 Units by mouth daily.   vitamin E 180 MG (400 UNITS) capsule Take 400 Units by mouth daily.   zonisamide 50 MG capsule Commonly known as: ZONEGRAN Take 50-100 mg by mouth See admin instructions. Take 50mg  by mouth in the morning and 100mg  at night.        Follow-up Information     O'Buch, Greta, PA-C Follow up in 2 week(s).    Specialty: Internal Medicine Why: Hospital follow up Contact information: [address] [place] [address] [phone]         Marinus Maw, MD Follow up.   Specialty: Cardiology Why: as scheduled Contact information: [address] [place] [address] [phone]         Johney Maine, MD Follow up.   Specialties: Hematology, Oncology Why: as scheduled Contact information: [address] [place] [address] [phone]                Discharge Exam: Ceasar Mons Weights   02/14/23 1323  Weight: 68 kg   General exam: Awake, laying in bed, in nad Respiratory system: Normal respiratory effort, no wheezing Cardiovascular  system: regular rate, s1, s2 Gastrointestinal system: Soft, nondistended, positive BS Central nervous system: CN2-12 grossly intact, strength intact Extremities: Perfused, no clubbing Skin: Normal skin turgor, no notable skin lesions seen Psychiatry: Mood normal // no visual hallucinations   Condition at discharge: fair  The results of significant diagnostics from this hospitalization (including imaging, microbiology, ancillary and laboratory) are listed below for reference.   Imaging Studies: ECHOCARDIOGRAM COMPLETE  Result Date: 02/15/2023    ECHOCARDIOGRAM REPORT   Patient Name:   CNYTHIA RUMINSKI Date of Exam: 02/15/2023 Medical Rec #:  [id number]       Height:       67.0 in Accession #:    [id number]      Weight:       150.0 lb Date of Birth:  [birth year]       BSA:          1.790 m Patient Age:    76 years        BP:           104/48 mmHg Patient Gender: F               HR:           72 bpm. Exam Location:  Inpatient Procedure: 2D Echo, Cardiac Doppler, Color Doppler and Intracardiac            Opacification Agent STAT ECHO Indications:     Chest pain  History:         Patient has prior history of Echocardiogram examinations, most                  recent 11/08/2022. Cardiomyopathy and CHF,  Previous Myocardial                  Infarction and CAD, Arrythmias:Atrial Fibrillation,                  Signs/Symptoms:Chest Pain; Risk Factors:Dyslipidemia, Former                  Smoker, Sleep Apnea and Hypertension. Hx PE, hx DVT, lymphoma.  Sonographer:     Wallie Char Referring Phys:  [id number] Tonye Royalty Diagnosing Phys: Weston Brass MD IMPRESSIONS  1. Left ventricular ejection fraction, by estimation, is 50%. The left ventricle has low normal function. The left ventricle demonstrates regional wall motion abnormalities unchanged in location from prior study on side by side review. Left ventricular diastolic parameters are consistent with Grade I diastolic dysfunction (impaired relaxation).  2. Right ventricular systolic function is normal. The right ventricular size is normal. There is mildly elevated pulmonary artery systolic pressure. The estimated right ventricular systolic pressure is 42.9 mmHg.  3. The mitral valve is grossly normal. Trivial mitral valve regurgitation. No evidence of mitral stenosis.  4. The aortic valve is normal in structure. Aortic valve regurgitation is not visualized. No aortic stenosis is present.  5. The inferior vena cava is dilated in size with <50% respiratory variability, suggesting right atrial pressure of 15 mmHg. Conclusion(s)/Recommendation(s): No left ventricular mural or apical thrombus/thrombi. FINDINGS  Left Ventricle: Left ventricular ejection fraction, by estimation, is 50%. The left ventricle has low normal function. The left ventricle demonstrates regional wall motion abnormalities. Definity contrast agent was given IV to delineate the left ventricular endocardial borders. The left ventricular internal cavity size was normal in size. There is borderline left ventricular hypertrophy. Left ventricular diastolic parameters are consistent with Grade I diastolic dysfunction (impaired relaxation).  LV Wall  Scoring: The mid and distal inferior wall and apical  septal segment are hypokinetic. Right Ventricle: The right ventricular size is normal. No increase in right ventricular wall thickness. Right ventricular systolic function is normal. There is mildly elevated pulmonary artery systolic pressure. The tricuspid regurgitant velocity is 2.64  m/s, and with an assumed right atrial pressure of 15 mmHg, the estimated right ventricular systolic pressure is 42.9 mmHg. Left Atrium: Left atrial size was normal in size. Right Atrium: Right atrial size was normal in size. Pericardium: There is no evidence of pericardial effusion. Mitral Valve: The mitral valve is grossly normal. Trivial mitral valve regurgitation. No evidence of mitral valve stenosis. MV peak gradient, 2.2 mmHg. The mean mitral valve gradient is 1.0 mmHg. Tricuspid Valve: The tricuspid valve is grossly normal. Tricuspid valve regurgitation is mild . No evidence of tricuspid stenosis. Aortic Valve: The aortic valve is normal in structure. Aortic valve regurgitation is not visualized. No aortic stenosis is present. Aortic valve mean gradient measures 3.0 mmHg. Aortic valve peak gradient measures 5.3 mmHg. Aortic valve area, by VTI measures 2.61 cm. Pulmonic Valve: The pulmonic valve was grossly normal. Pulmonic valve regurgitation is trivial. Aorta: The aortic root is normal in size and structure. Venous: The inferior vena cava was not well visualized. The inferior vena cava is dilated in size with less than 50% respiratory variability, suggesting right atrial pressure of 15 mmHg. IAS/Shunts: The interatrial septum was not well visualized. Additional Comments: A device lead is visualized in the right atrium and right ventricle.  LEFT VENTRICLE PLAX 2D LVIDd:         5.00 cm      Diastology LVIDs:         3.70 cm      LV e' medial:    8.12 cm/s LV PW:         0.90 cm      LV E/e' medial:  7.8 LV IVS:        1.00 cm      LV e' lateral:   10.20 cm/s LVOT diam:     2.10 cm      LV E/e' lateral: 6.2 LV SV:         67 LV  SV Index:   37 LVOT Area:     3.46 cm  LV Volumes (MOD) LV vol d, MOD A2C: 113.0 ml LV vol d, MOD A4C: 138.0 ml LV vol s, MOD A2C: 62.1 ml LV vol s, MOD A4C: 62.7 ml LV SV MOD A2C:     50.9 ml LV SV MOD A4C:     138.0 ml LV SV MOD BP:      64.2 ml RIGHT VENTRICLE             IVC RV Basal diam:  3.20 cm     IVC diam: 2.30 cm RV S prime:     15.30 cm/s TAPSE (M-mode): 2.0 cm LEFT ATRIUM             Index        RIGHT ATRIUM           Index LA diam:        4.20 cm 2.35 cm/m   RA Area:     13.80 cm LA Vol (A2C):   47.3 ml 26.43 ml/m  RA Volume:   30.40 ml  16.99 ml/m LA Vol (A4C):   37.6 ml 21.01 ml/m LA Biplane Vol: 43.3 ml 24.20 ml/m  AORTIC VALVE AV Area (  Vmax):    2.93 cm AV Area (Vmean):   2.64 cm AV Area (VTI):     2.61 cm AV Vmax:           115.00 cm/s AV Vmean:          82.450 cm/s AV VTI:            0.256 m AV Peak Grad:      5.3 mmHg AV Mean Grad:      3.0 mmHg LVOT Vmax:         97.25 cm/s LVOT Vmean:        62.850 cm/s LVOT VTI:          0.193 m LVOT/AV VTI ratio: 0.75  AORTA Ao Root diam: 3.50 cm Ao Asc diam:  3.40 cm MITRAL VALVE               TRICUSPID VALVE MV Area (PHT): 3.00 cm    TR Peak grad:   27.9 mmHg MV Area VTI:   2.84 cm    TR Vmax:        264.00 cm/s MV Peak grad:  2.2 mmHg MV Mean grad:  1.0 mmHg    SHUNTS MV Vmax:       0.74 m/s    Systemic VTI:  0.19 m MV Vmean:      51.1 cm/s   Systemic Diam: 2.10 cm MV Decel Time: 253 msec MV E velocity: 63.40 cm/s MV A velocity: 79.60 cm/s MV E/A ratio:  0.80 Weston Brass MD Electronically signed by Weston Brass MD Signature Date/Time: 02/15/2023/3:00:43 PM    Final (Updated)    CT Angio Chest PE W and/or Wo Contrast  Result Date: 02/14/2023 CLINICAL DATA:  Indigestion, chest pain, history of diffuse large B-cell lymphoma EXAM: CT ANGIOGRAPHY CHEST WITH CONTRAST TECHNIQUE: Multidetector CT imaging of the chest was performed using the standard protocol during bolus administration of intravenous contrast. Multiplanar CT image  reconstructions and MIPs were obtained to evaluate the vascular anatomy. RADIATION DOSE REDUCTION: This exam was performed according to the departmental dose-optimization program which includes automated exposure control, adjustment of the mA and/or kV according to patient size and/or use of iterative reconstruction technique. CONTRAST:  75mL OMNIPAQUE IOHEXOL 350 MG/ML SOLN COMPARISON:  PET-CT dated 12/25/2022 FINDINGS: Cardiovascular: Satisfactory opacification of the bilateral pulmonary arteries to the segmental level. No evidence of pulmonary embolism. Although not tailored for evaluation of the thoracic aorta, there is no evidence of thoracic aortic aneurysm or dissection. Atherosclerotic calcifications of the aortic arch. Cardiomegaly.  No pericardial effusion.  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 Right chest port terminates at the cavoatrial junction. Mediastinum/Nodes: No suspicious mediastinal lymphadenopathy. Visualized thyroid is unremarkable. Lungs/Pleura: Evaluation of the lung parenchyma is constrained by respiratory motion. Within that constraint, there are clustered posterior left upper lobe nodules measuring up to 7 mm (series 12/image 46), new from PET, likely reflecting mild infection/inflammation. Superimposed patchy opacities in the lingula and bilateral lower lobes, suggesting pneumonia. 10 mm solid nodule in the central right upper lobe (series 12/image 51), non FDG avid on prior PET but favored to be mildly progressive on the current study (previously measured as 5 mm but might measure as much as 8 mm in retrospect). No pleural effusion or pneumothorax. Upper Abdomen: Visualized upper abdomen is grossly unremarkable. Musculoskeletal: Visualized osseous structures are within normal limits. Review of the MIP images confirms the above findings. IMPRESSION: No evidence of pulmonary embolism. Lingular  and bilateral lower lobe  pneumonia. 10 mm central right upper lobe nodule, mildly progressive. Early primary bronchogenic carcinoma is possible. Follow-up CT chest is suggested in 3 months. Electronically Signed   By: Charline Bills M.D.   On: 02/14/2023 17:28   DG Chest Port 1 View  Result Date: 02/14/2023 CLINICAL DATA:  Chest pain. EXAM: PORTABLE CHEST 1 VIEW COMPARISON:  September 26, 2016. FINDINGS: The heart size and mediastinal contours are within normal limits. Left-sided defibrillator is unchanged. Right internal jugular Port-A-Cath is now noted with distal tip in expected position of cavoatrial junction. Hypoinflation of the lungs is noted with mild bibasilar subsegmental atelectasis. The visualized skeletal structures are unremarkable. IMPRESSION: Hypoinflation of the lungs with mild bibasilar subsegmental atelectasis. Electronically Signed   By: Lupita Raider M.D.   On: 02/14/2023 15:11   CUP PACEART REMOTE DEVICE CHECK  Result Date: 02/06/2023 Monthly battery check.  Estimated 17mo Normal device function. No new alerts. Follow up as scheduled monthly LA, CVRS   Microbiology: Results for orders placed or performed during the hospital encounter of 02/14/23  SARS Coronavirus 2 by RT PCR (hospital order, performed in [facility] hospital lab) *cepheid single result test* Anterior Nasal Swab     Status: None   Collection Time: 02/15/23 12:03 AM   Specimen: Anterior Nasal Swab  Result Value Ref Range Status   SARS Coronavirus 2 by RT PCR NEGATIVE NEGATIVE Final    Comment: (NOTE) SARS-CoV-2 target nucleic acids are NOT DETECTED.  The SARS-CoV-2 RNA is generally detectable in upper and lower respiratory specimens during the acute phase of infection. The lowest concentration of SARS-CoV-2 viral copies this assay can detect is 250 copies / mL. A negative result does not preclude SARS-CoV-2 infection and should not be used as the sole basis for treatment or other patient management decisions.  A negative result  may occur with improper specimen collection / handling, submission of specimen other than nasopharyngeal swab, presence of viral mutation(s) within the areas targeted by this assay, and inadequate number of viral copies (<250 copies / mL). A negative result must be combined with clinical observations, patient history, and epidemiological information.  Fact Sheet for Patients:   RoadLapTop.co.za  Fact Sheet for Healthcare Providers: http://kim-miller.com/  This test is not yet approved or  cleared by the [place] FDA and has been authorized for detection and/or diagnosis of SARS-CoV-2 by FDA under an Emergency Use Authorization (EUA).  This EUA will remain in effect (meaning this test can be used) for the duration of the COVID-19 declaration under Section 564(b)(1) of the Act, 21 U.S.C. section 360bbb-3(b)(1), unless the authorization is terminated or revoked sooner.  Performed at [facility], [address] [place], [place] [address]   Respiratory (~20 pathogens) panel by PCR     Status: None   Collection Time: 02/15/23 12:03 AM   Specimen: Anterior Nasal Swab; Respiratory  Result Value Ref Range Status   Adenovirus NOT DETECTED NOT DETECTED Final   Coronavirus 229E NOT DETECTED NOT DETECTED Final    Comment: (NOTE) The Coronavirus on the Respiratory Panel, DOES NOT test for the novel  Coronavirus (2019 nCoV)    Coronavirus HKU1 NOT DETECTED NOT DETECTED Final   Coronavirus NL63 NOT DETECTED NOT DETECTED Final   Coronavirus OC43 NOT DETECTED NOT DETECTED Final   Metapneumovirus NOT DETECTED NOT DETECTED Final   Rhinovirus / Enterovirus NOT DETECTED NOT DETECTED Final   Influenza A NOT DETECTED NOT DETECTED Final  Influenza B NOT DETECTED NOT DETECTED Final   Parainfluenza Virus 1 NOT DETECTED NOT DETECTED Final   Parainfluenza Virus 2 NOT DETECTED NOT DETECTED Final   Parainfluenza Virus 3 NOT DETECTED  NOT DETECTED Final   Parainfluenza Virus 4 NOT DETECTED NOT DETECTED Final   Respiratory Syncytial Virus NOT DETECTED NOT DETECTED Final   Bordetella pertussis NOT DETECTED NOT DETECTED Final   Bordetella Parapertussis NOT DETECTED NOT DETECTED Final   Chlamydophila pneumoniae NOT DETECTED NOT DETECTED Final   Mycoplasma pneumoniae NOT DETECTED NOT DETECTED Final    Comment: Performed at [facility], [address], [place], [place] [address]    Labs: CBC: Recent Labs  Lab 02/12/23 0902 02/14/23 1333 02/15/23 0530 02/16/23 0259  WBC 4.7 8.2 5.1 2.3*  NEUTROABS 3.8  --   --   --   HGB 12.1 11.0* 9.6* 9.2*  HCT 36.0 35.7* 30.7* 28.7*  MCV 95.5 99.7 99.0 100.0  PLT 114* 117* 93* 85*   Basic Metabolic Panel: Recent Labs  Lab 02/12/23 0902 02/14/23 1333 02/15/23 0530 02/16/23 0259  NA 141 137 139 135  K 3.5 3.3* 3.4* 3.4*  CL 109 106 105 100  CO2 26 23 26 26   GLUCOSE 111* 131* 99 89  BUN 14 20 16 12   CREATININE 0.73 0.60 0.47 0.50  CALCIUM 9.1 8.8* 8.8* 7.9*  MG  --   --  2.0 2.0  PHOS  --   --  3.6 3.9   Liver Function Tests: Recent Labs  Lab 02/12/23 0902 02/14/23 1333 02/15/23 0530  AST 17 22 14*  ALT 15 21 18   ALKPHOS 83 67 56  BILITOT 0.5 1.0 0.9  PROT 6.4* 6.2* 5.7*  ALBUMIN 4.1 3.8 3.3*   CBG: No results for input(s): "GLUCAP" in the last 168 hours.  Discharge time spent: less than 30 minutes.  Signed: Rickey Barbara, MD [facility] 02/16/2023

## 2023-02-16 NOTE — Progress Notes (Signed)
Assumed care of the pt.Pt condition stable at this time. No changes in initial AM assessment at this time

## 2023-02-16 NOTE — Plan of Care (Signed)

## 2023-02-16 NOTE — Consult Note (Signed)
Cardiology Consultation   Patient ID: DIERDRE CALTON MRN: [id number]; DOB: 1945/10/18  Admit date: 02/14/2023 Date of Consult: 02/16/2023  PCP:  Eunice Blase, PA-C   [facility] Providers Cardiologist:  Lewayne Bunting, MD        Patient Profile:   Rachel Bates is a 77 y.o. female with a hx of CAD with stent 1995, ICM, PAF, hx ICD approaching ERI -2 month estimated, lymphoma of tongue who is being seen 02/16/2023 for the evaluation of chest pain at the request of Dr Rhona Leavens.  History of Present Illness:   Rachel Bates with hx of MI and 2 stents to LCX in 1995 in [place] and [place] with ICD placed with ICM.  She has no recent hx of angina.  She had ICD change out in 2014 and now approaching ERI.  She has hx of PAF with no recent recurrence.  Echo 11/08/22 with EF 45-50%, + RWMA, G2DD, RV normal.  She has lymphoma of tongue and undergoing chemo.   Pt presented to ER 02/14/23 with indigestion.  Stabbing pain in center of chest, + nausea, pepcid did not help.  Pain worse with inspiration.   CXR with hypoinflation of the lungs with mild bibasilar subsegmental atelectasis.  CTA of chest with no PE + lingular bilateral lower lobe PNA and 10 mm central right upper lobe nodule, mildly progressive. Early primary bronchogenic carcinoma is possible. Follow-up CT chest is suggested in 3 months.  Echo with EF 50%, +RWMA unchanged, G1DD,  RV normal. Trivial MR   Labs today Na 135 K+ 3.4 Mg+ 2.0  BNP 322 Hs troponin 2>>4  TSH 3.241 WBC 2.3 Hgb 9.2 plts 85   BP 123/53 P 67 -90  afebrile  R 20 sp02 94% RA She has appt 8/20 with Dr. Ladona Ridgel to discuss ICD change out with ERI  At home she had been vacuuming and sweeping without chest pain or SOB.  She has developed back pain and had injection prior to admit so has not been as active.   Past Medical History:  Diagnosis Date   AICD (automatic cardioverter/defibrillator) present dual   [employer] ---  original placedment 2007/  generator change 2014 by  dr gregg taylor   Anemia    Anticoagulant long-term use    eliquis   Anxiety    Arthralgia of multiple joints    Arthritis pain    Benign hypertensive heart disease    CAD (coronary artery disease) primary cardiologist-- dr gregg taylor   MI and 2 stents 1995 in [place]   Cancer [facility])    CHF NYHA class II, chronic, systolic (HCC)    followed by dr Sharlot Gowda taylor   Degenerative disc disease, lumbar    Depression    Dyslipidemia    History of basal cell carcinoma (BCC) excision    right ankle area s/p  excision in office 06/ 2019  in office   History of DVT of lower extremity yrs ago before 2012   History of MI (myocardial infarction) 1995  in [place]   History of pulmonary embolus (PE) 2012   History of ventricular tachycardia    Hyperlipidemia    Hypersomnia    Hypertension    Ischemic cardiomyopathy    Migraines    Myocardial infarction (HCC)    OA (osteoarthritis)    "all over"   Obstructive apnea    PAF (paroxysmal atrial fibrillation) (HCC)    Primary localized osteoarthritis of right hip 12/18/2017   Primary  localized osteoarthritis of right knee 05/28/2018   S/P coronary artery stent placement 1995   in [place]   05-21-2018 per pt x2  stents in same coronary artery (unsure BM or DES)   Vitamin D deficiency disease     Past Surgical History:  Procedure Laterality Date   BIOPSY  06/19/2022   Procedure: BIOPSY;  Surgeon: Kathi Der, MD;  Location: [facility];  Service: Gastroenterology;;   CARDIAC DEFIBRILLATOR PLACEMENT  2007   CATARACT EXTRACTION, BILATERAL     COLONOSCOPY  04/14/2013   colonic polyp, status post polypectomy. Mild panocolonic diverticulosis. Small internal hemorrhoids   DILATION AND CURETTAGE OF UTERUS  yrs ago   DIRECT LARYNGOSCOPY Right 10/04/2022   Procedure: DIRECT LARYNGOSCOPY;  Surgeon: Cheron Schaumann A, DO;  Location: [facility] OR;  Service: ENT;  Laterality: Right;   ESOPHAGOGASTRODUODENOSCOPY (EGD) WITH PROPOFOL N/A 06/19/2022   Procedure:  ESOPHAGOGASTRODUODENOSCOPY (EGD) WITH PROPOFOL;  Surgeon: Kathi Der, MD;  Location: [facility];  Service: Gastroenterology;  Laterality: N/A;   IMPACTION REMOVAL  06/19/2022   Procedure: IMPACTION REMOVAL;  Surgeon: Kathi Der, MD;  Location: [facility];  Service: Gastroenterology;;   IMPLANTABLE CARDIOVERTER DEFIBRILLATOR GENERATOR CHANGE N/A 02/04/2013   Procedure: IMPLANTABLE CARDIOVERTER DEFIBRILLATOR GENERATOR CHANGE;  Surgeon: Marinus Maw, MD;  Location: [facility] CATH LAB;  Service: Cardiovascular;  Laterality: N/A;   IR IMAGING GUIDED PORT INSERTION  10/27/2022   LYMPH NODE BIOPSY Right 10/04/2022   Procedure: EXCISIONAL OF RIGHT DEEP CERVICAL LYMPH NODE;  Surgeon: Laren Boom, DO;  Location: [facility];  Service: ENT;  Laterality: Right;   SHOULDER ARTHROSCOPY Right 2015   TOTAL HIP ARTHROPLASTY Right 12/18/2017   Procedure: RIGHT TOTAL HIP ARTHROPLASTY;  Surgeon: Teryl Lucy, MD;  Location: [facility];  Service: Orthopedics;  Laterality: Right;   TOTAL HIP ARTHROPLASTY Left 04/19/2021   Procedure: TOTAL HIP ARTHROPLASTY;  Surgeon: Teryl Lucy, MD;  Location: [facility];  Service: Orthopedics;  Laterality: Left;   TOTAL KNEE ARTHROPLASTY Right 05/28/2018   Procedure: TOTAL KNEE ARTHROPLASTY;  Surgeon: Teryl Lucy, MD;  Location: [facility];  Service: Orthopedics;  Laterality: Right;  Adductor Block   TOTAL KNEE ARTHROPLASTY Left 08/12/2021   Procedure: TOTAL KNEE ARTHROPLASTY;  Surgeon: Teryl Lucy, MD;  Location: [facility];  Service: Orthopedics;  Laterality: Left;   TUBAL LIGATION Bilateral yrs ago   WISDOM TOOTH EXTRACTION       Home Medications:  Prior to Admission medications   Medication Sig Start Date End Date Taking? Authorizing Provider  acetaminophen (TYLENOL) 650 MG CR tablet Take 1,300 mg by mouth every 8 (eight) hours as needed for pain.   Yes [provider]  acyclovir (ZOVIRAX) 400 MG tablet TAKE 1 TABLET BY MOUTH TWICE A DAY 01/15/23  Yes Johney Maine, MD  allopurinol (ZYLOPRIM) 300 MG tablet TAKE 0.5 TABLETS BY MOUTH DAILY. 12/11/22  Yes Johney Maine, MD  apixaban (ELIQUIS) 5 MG TABS tablet TAKE 1 TABLET BY MOUTH TWICE A DAY 08/21/22  Yes Marinus Maw, MD  b complex vitamins capsule Take 1 capsule by mouth daily.   Yes [provider]  Calcium Carb-Cholecalciferol (CALCIUM 600 + D PO) Take 1 tablet by mouth daily.   Yes [provider]  carvedilol (COREG) 12.5 MG tablet Take 12.5 mg by mouth 2 (two) times daily with a meal.    Yes [provider]  Cholecalciferol (VITAMIN D3) 125 MCG (5000 UT) capsule Take 5,000 Units by mouth daily.   Yes [provider]  cyanocobalamin (VITAMIN B12) 1000 MCG tablet Take 1,000 mcg by mouth daily.   Yes [provider]  FIBER PO Take 1 capsule by mouth 2 (two) times daily.   Yes [provider]  Fiber POWD Take 1 Scoop by mouth daily.   Yes [provider]  gabapentin (NEURONTIN) 300 MG capsule Take 300 mg by mouth 3 (three) times daily. 04/09/20  Yes [provider]  lidocaine-prilocaine (EMLA) cream Apply to affected area once Patient taking differently: Apply 1 Application topically as needed (port access). 10/19/22  Yes Johney Maine, MD  loratadine (CLARITIN) 10 MG tablet Take 10 mg by mouth daily.   Yes [provider]  Omega-3 Fatty Acids (FISH OIL) 1000 MG CAPS Take 1,000 mg by mouth daily.    Yes [provider]  pantoprazole (PROTONIX) 40 MG tablet Take 1 tablet (40 mg total) by mouth 2 (two) times daily. 06/19/22 06/19/23 Yes Brahmbhatt, Parag, MD  predniSONE (DELTASONE) 20 MG tablet Take 3 tablets (60 mg total) by mouth daily with breakfast. Take for 5 days now x 1 course and then for 5 days after each cycle of chemotherapy Patient taking differently: Take 60 mg by mouth daily with breakfast. Takes for 5 days after chemo 10/19/22  Yes Johney Maine, MD  ramipril (ALTACE) 2.5 MG tablet  Take 2.5 mg by mouth daily.     Yes [provider]  simvastatin (ZOCOR) 40 MG tablet Take 40 mg by mouth at bedtime.     Yes [provider]  tiZANidine (ZANAFLEX) 4 MG tablet Take 2 mg by mouth daily as needed for muscle spasms. 01/12/23  Yes [provider]  traZODone (DESYREL) 50 MG tablet Take 50 mg by mouth at bedtime.    Yes [provider]  venlafaxine (EFFEXOR) 75 MG tablet Take 75 mg by mouth daily. 03/06/21  Yes [provider]  vitamin E 400 UNIT capsule Take 400 Units by mouth daily.   Yes [provider]  zonisamide (ZONEGRAN) 50 MG capsule Take 50-100 mg by mouth See admin instructions. Take 50mg  by mouth in the morning and 100mg  at night. 09/13/17  Yes [provider]  ondansetron (ZOFRAN) 8 MG tablet Take 1 tablet (8 mg total) by mouth every 8 (eight) hours as needed for nausea or vomiting. Start on day 3 after cyclophosphamide chemotherapy. Patient not taking: Reported on 02/12/2023 10/19/22   Johney Maine, MD  prochlorperazine (COMPAZINE) 10 MG tablet Take 1 tablet (10 mg total) by mouth every 6 (six) hours as needed for nausea or vomiting. Patient not taking: Reported on 02/12/2023 10/19/22   Johney Maine, MD    Inpatient Medications: Scheduled Meds:  acyclovir  400 mg Oral BID   allopurinol  150 mg Oral Daily   apixaban  5 mg Oral BID   aspirin EC  81 mg Oral Daily   calcium-vitamin D  1 tablet Oral Daily   carvedilol  12.5 mg Oral BID WC   Chlorhexidine Gluconate Cloth  6 each Topical Daily   cholecalciferol  5,000 Units Oral Daily   cyanocobalamin  1,000 mcg Oral Daily   gabapentin  300 mg Oral TID   loratadine  10 mg Oral Daily   omega-3 acid ethyl esters  1,000 mg Oral Daily   pantoprazole  40 mg Oral BID   polycarbophil  625 mg Oral BID   potassium chloride  20 mEq Oral BID   ramipril  2.5 mg Oral Daily  simvastatin  40 mg Oral QHS   sodium chloride flush  10-40 mL Intracatheter Q12H    sodium chloride flush  3 mL Intravenous Q12H   venlafaxine  75 mg Oral Daily   vitamin E  400 Units Oral Daily   zonisamide  100 mg Oral QHS   zonisamide  50 mg Oral Daily   Continuous Infusions:  azithromycin 500 mg (02/15/23 2124)   cefTRIAXone (ROCEPHIN)  IV 1 g (02/15/23 2051)   PRN Meds: acetaminophen **OR** acetaminophen, albuterol, bisacodyl, hydrALAZINE, HYDROcodone-acetaminophen, ipratropium, morphine injection, nitroGLYCERIN, ondansetron **OR** ondansetron (ZOFRAN) IV, senna-docusate, sodium chloride flush, tiZANidine, traZODone  Allergies:    Allergies  Allergen Reactions   Sumatriptan Succinate Other (See Comments)    Chest pain, no triptans, pt states "it makes my heart race"   Amitriptyline Other (See Comments)    Weight gain    Social History:   Social History   Socioeconomic History   Marital status: Single    Spouse name: Not on file   Number of children: Not on file   Years of education: Not on file   Highest education level: Not on file  Occupational History   Not on file  Tobacco Use   Smoking status: Former    Current packs/day: 0.00    Types: Cigarettes    Start date: 05/21/1966    Quit date: 05/21/1996    Years since quitting: 26.7   Smokeless tobacco: Never  Vaping Use   Vaping status: Never Used  Substance and Sexual Activity   Alcohol use: No   Drug use: Never   Sexual activity: Not on file  Other Topics Concern   Not on file  Social History Narrative   Not on file   Social Determinants of Health   Financial Resource Strain: Low Risk  (05/28/2018)   Overall Financial Resource Strain (CARDIA)    Difficulty of Paying Living Expenses: Not hard at all  Food Insecurity: No Food Insecurity (02/15/2023)   Hunger Vital Sign    Worried About Running Out of Food in the Last Year: Never true    Ran Out of Food in the Last Year: Never true  Transportation Needs: No Transportation Needs (02/15/2023)   PRAPARE - Scientist, research (physical sciences) (Medical): No    Lack of Transportation (Non-Medical): No  Physical Activity: Not on file  Stress: Not on file  Social Connections: Not on file  Intimate Partner Violence: Unknown (02/15/2023)   Humiliation, Afraid, Rape, and Kick questionnaire    Fear of Current or Ex-Partner: No    Emotionally Abused: No    Physically Abused: Not on file    Sexually Abused: No    Family History:    Family History  Problem Relation Age of Onset   Hypertension Mother    Thyroid disease Mother    Alzheimer's disease Mother    Coronary artery disease Father    Pulmonary embolism Father    Congestive Heart Failure Maternal Grandmother    Hypertension Maternal Grandmother    Heart attack Maternal Grandfather    Other Maternal Grandfather        carotid disease   Dementia Paternal Grandmother    Other Paternal Grandfather 12       accident     ROS:  Please see the history of present illness.  General:no colds or fevers, no weight changes Skin:no rashes or ulcers HEENT:no blurred vision, no congestion CV:see HPI PUL:see HPI GI:no diarrhea constipation or melena, no  indigestion GU:no hematuria, no dysuria MS:no joint pain, no claudication Neuro:no syncope, no lightheadedness Endo:no diabetes, no thyroid disease Oncology with lymphoma of tongue undergoing chemo  All other ROS reviewed and negative.     Physical Exam/Data:   Vitals:   02/15/23 1600 02/15/23 1601 02/15/23 2032 02/16/23 0608  BP: (!) 111/50 (!) 111/50 (!) 110/55 (!) 123/53  Pulse: 68  77 80  Resp:  15 18 18   Temp:  98.2 F (36.8 C) 97.8 F (36.6 C) 98 F (36.7 C)  TempSrc:  Oral Oral Oral  SpO2:  97% 96% 94%  Weight:      Height:        Intake/Output Summary (Last 24 hours) at 02/16/2023 0755 Last data filed at 02/15/2023 1835 Gross per 24 hour  Intake 340 ml  Output --  Net 340 ml      02/14/2023    1:23 PM 02/12/2023    9:31 AM 01/22/2023   10:25 AM  Last 3 Weights  Weight (lbs) 150 lb 152 lb  9.6 oz 152 lb 9.6 oz  Weight (kg) 68.04 kg 69.219 kg 69.219 kg     Body mass index is 23.49 kg/m.  General:  Well nourished, well developed, in no acute distress HEENT: normal Neck: no JVD sitting up in bed Vascular: No carotid bruits; Distal pulses 2+ bilaterally Cardiac:  normal S1, S2; RRR; no murmur gallup rub or click Lungs:  few crackles in Rt base to auscultation bilaterally, no wheezing, rhonchi   Abd: soft, nontender, no hepatomegaly  Ext: no edema Musculoskeletal:  No deformities, BUE and BLE strength normal and equal Skin: warm and dry  Neuro:  alert and oriented X 3 MAE follows commands no focal abnormalities noted Psych:  Normal affect   EKG:  The EKG was personally reviewed and demonstrates:  SR with old Q waves inf and lat. Previously was atrially paced -do not see spikes on current EKG,  T waves laterally more pronounced.  Telemetry:  Telemetry was personally reviewed and demonstrates:  SR at times atrially paced, NSVT on episode of 5 beats and one of 9 beats.   Relevant CV Studies: Echo 02/15/23 IMPRESSIONS     1. Left ventricular ejection fraction, by estimation, is 50%. The left  ventricle has low normal function. The left ventricle demonstrates  regional wall motion abnormalities unchanged in location from prior study  on side by side review. Left ventricular  diastolic parameters are consistent with Grade I diastolic dysfunction  (impaired relaxation).   2. Right ventricular systolic function is normal. The right ventricular  size is normal. There is mildly elevated pulmonary artery systolic  pressure. The estimated right ventricular systolic pressure is 42.9 mmHg.   3. The mitral valve is grossly normal. Trivial mitral valve  regurgitation. No evidence of mitral stenosis.   4. The aortic valve is normal in structure. Aortic valve regurgitation is  not visualized. No aortic stenosis is present.   5. The inferior vena cava is dilated in size with <50%  respiratory  variability, suggesting right atrial pressure of 15 mmHg.   Conclusion(s)/Recommendation(s): No left ventricular mural or apical  thrombus/thrombi.   FINDINGS   Left Ventricle: Left ventricular ejection fraction, by estimation, is  50%. The left ventricle has low normal function. The left ventricle  demonstrates regional wall motion abnormalities. Definity contrast agent  was given IV to delineate the left  ventricular endocardial borders. The left ventricular internal cavity size  was normal in size. There is  borderline left ventricular hypertrophy. Left  ventricular diastolic parameters are consistent with Grade I diastolic  dysfunction (impaired  relaxation).     LV Wall Scoring:  The mid and distal inferior wall and apical septal segment are  hypokinetic.   Right Ventricle: The right ventricular size is normal. No increase in  right ventricular wall thickness. Right ventricular systolic function is  normal. There is mildly elevated pulmonary artery systolic pressure. The  tricuspid regurgitant velocity is 2.64   m/s, and with an assumed right atrial pressure of 15 mmHg, the estimated  right ventricular systolic pressure is 42.9 mmHg.   Left Atrium: Left atrial size was normal in size.   Right Atrium: Right atrial size was normal in size.   Pericardium: There is no evidence of pericardial effusion.   Mitral Valve: The mitral valve is grossly normal. Trivial mitral valve  regurgitation. No evidence of mitral valve stenosis. MV peak gradient, 2.2  mmHg. The mean mitral valve gradient is 1.0 mmHg.   Tricuspid Valve: The tricuspid valve is grossly normal. Tricuspid valve  regurgitation is mild . No evidence of tricuspid stenosis.   Aortic Valve: The aortic valve is normal in structure. Aortic valve  regurgitation is not visualized. No aortic stenosis is present. Aortic  valve mean gradient measures 3.0 mmHg. Aortic valve peak gradient measures  5.3 mmHg.  Aortic valve area, by VTI  measures 2.61 cm.   Pulmonic Valve: The pulmonic valve was grossly normal. Pulmonic valve  regurgitation is trivial.   Aorta: The aortic root is normal in size and structure.   Venous: The inferior vena cava was not well visualized. The inferior vena  cava is dilated in size with less than 50% respiratory variability,  suggesting right atrial pressure of 15 mmHg.   IAS/Shunts: The interatrial septum was not well visualized.   Additional Comments: A device lead is visualized in the right atrium and  right ventricle.    Laboratory Data:  High Sensitivity Troponin:   Recent Labs  Lab 02/14/23 1333 02/15/23 0530  TROPONINIHS 2 4     Chemistry Recent Labs  Lab 02/14/23 1333 02/15/23 0530 02/16/23 0259  NA 137 139 135  K 3.3* 3.4* 3.4*  CL 106 105 100  CO2 23 26 26   GLUCOSE 131* 99 89  BUN 20 16 12   CREATININE 0.60 0.47 0.50  CALCIUM 8.8* 8.8* 7.9*  MG  --  2.0 2.0  GFRNONAA >60 >60 >60  ANIONGAP 8 8 9     Recent Labs  Lab 02/12/23 0902 02/14/23 1333 02/15/23 0530  PROT 6.4* 6.2* 5.7*  ALBUMIN 4.1 3.8 3.3*  AST 17 22 14*  ALT 15 21 18   ALKPHOS 83 67 56  BILITOT 0.5 1.0 0.9   Lipids No results for input(s): "CHOL", "TRIG", "HDL", "LABVLDL", "LDLCALC", "CHOLHDL" in the last 168 hours.  Hematology Recent Labs  Lab 02/14/23 1333 02/15/23 0530 02/16/23 0259  WBC 8.2 5.1 2.3*  RBC 3.58* 3.10* 2.87*  HGB 11.0* 9.6* 9.2*  HCT 35.7* 30.7* 28.7*  MCV 99.7 99.0 100.0  MCH 30.7 31.0 32.1  MCHC 30.8 31.3 32.1  RDW 15.2 15.0 14.8  PLT 117* 93* 85*   Thyroid  Recent Labs  Lab 02/15/23 0530  TSH 3.241    BNP Recent Labs  Lab 02/15/23 0530  BNP 322.0*    DDimer No results for input(s): "DDIMER" in the last 168 hours.   Radiology/Studies:  ECHOCARDIOGRAM COMPLETE  Result Date: 02/15/2023    ECHOCARDIOGRAM REPORT  Patient Name:   SHEONA ALIM Date of Exam: 02/15/2023 Medical Rec #:  [id number]       Height:       67.0 in  Accession #:    [id number]      Weight:       150.0 lb Date of Birth:  11/29/1945       BSA:          1.790 m Patient Age:    14 years        BP:           104/48 mmHg Patient Gender: F               HR:           72 bpm. Exam Location:  Inpatient Procedure: 2D Echo, Cardiac Doppler, Color Doppler and Intracardiac            Opacification Agent STAT ECHO Indications:     Chest pain  History:         Patient has prior history of Echocardiogram examinations, most                  recent 11/08/2022. Cardiomyopathy and CHF, Previous Myocardial                  Infarction and CAD, Arrythmias:Atrial Fibrillation,                  Signs/Symptoms:Chest Pain; Risk Factors:Dyslipidemia, Former                  Smoker, Sleep Apnea and Hypertension. Hx PE, hx DVT, lymphoma.  Sonographer:     Wallie Char Referring Phys:  [id number] Tonye Royalty Diagnosing Phys: Weston Brass MD IMPRESSIONS  1. Left ventricular ejection fraction, by estimation, is 50%. The left ventricle has low normal function. The left ventricle demonstrates regional wall motion abnormalities unchanged in location from prior study on side by side review. Left ventricular diastolic parameters are consistent with Grade I diastolic dysfunction (impaired relaxation).  2. Right ventricular systolic function is normal. The right ventricular size is normal. There is mildly elevated pulmonary artery systolic pressure. The estimated right ventricular systolic pressure is 42.9 mmHg.  3. The mitral valve is grossly normal. Trivial mitral valve regurgitation. No evidence of mitral stenosis.  4. The aortic valve is normal in structure. Aortic valve regurgitation is not visualized. No aortic stenosis is present.  5. The inferior vena cava is dilated in size with <50% respiratory variability, suggesting right atrial pressure of 15 mmHg. Conclusion(s)/Recommendation(s): No left ventricular mural or apical thrombus/thrombi. FINDINGS  Left Ventricle: Left ventricular  ejection fraction, by estimation, is 50%. The left ventricle has low normal function. The left ventricle demonstrates regional wall motion abnormalities. Definity contrast agent was given IV to delineate the left ventricular endocardial borders. The left ventricular internal cavity size was normal in size. There is borderline left ventricular hypertrophy. Left ventricular diastolic parameters are consistent with Grade I diastolic dysfunction (impaired relaxation).  LV Wall Scoring: The mid and distal inferior wall and apical septal segment are hypokinetic. Right Ventricle: The right ventricular size is normal. No increase in right ventricular wall thickness. Right ventricular systolic function is normal. There is mildly elevated pulmonary artery systolic pressure. The tricuspid regurgitant velocity is 2.64  m/s, and with an assumed right atrial pressure of 15 mmHg, the estimated right ventricular systolic pressure is 42.9 mmHg. Left Atrium: Left atrial size was normal in size. Right Atrium: Right  atrial size was normal in size. Pericardium: There is no evidence of pericardial effusion. Mitral Valve: The mitral valve is grossly normal. Trivial mitral valve regurgitation. No evidence of mitral valve stenosis. MV peak gradient, 2.2 mmHg. The mean mitral valve gradient is 1.0 mmHg. Tricuspid Valve: The tricuspid valve is grossly normal. Tricuspid valve regurgitation is mild . No evidence of tricuspid stenosis. Aortic Valve: The aortic valve is normal in structure. Aortic valve regurgitation is not visualized. No aortic stenosis is present. Aortic valve mean gradient measures 3.0 mmHg. Aortic valve peak gradient measures 5.3 mmHg. Aortic valve area, by VTI measures 2.61 cm. Pulmonic Valve: The pulmonic valve was grossly normal. Pulmonic valve regurgitation is trivial. Aorta: The aortic root is normal in size and structure. Venous: The inferior vena cava was not well visualized. The inferior vena cava is dilated in size  with less than 50% respiratory variability, suggesting right atrial pressure of 15 mmHg. IAS/Shunts: The interatrial septum was not well visualized. Additional Comments: A device lead is visualized in the right atrium and right ventricle.  LEFT VENTRICLE PLAX 2D LVIDd:         5.00 cm      Diastology LVIDs:         3.70 cm      LV e' medial:    8.12 cm/s LV PW:         0.90 cm      LV E/e' medial:  7.8 LV IVS:        1.00 cm      LV e' lateral:   10.20 cm/s LVOT diam:     2.10 cm      LV E/e' lateral: 6.2 LV SV:         67 LV SV Index:   37 LVOT Area:     3.46 cm  LV Volumes (MOD) LV vol d, MOD A2C: 113.0 ml LV vol d, MOD A4C: 138.0 ml LV vol s, MOD A2C: 62.1 ml LV vol s, MOD A4C: 62.7 ml LV SV MOD A2C:     50.9 ml LV SV MOD A4C:     138.0 ml LV SV MOD BP:      64.2 ml RIGHT VENTRICLE             IVC RV Basal diam:  3.20 cm     IVC diam: 2.30 cm RV S prime:     15.30 cm/s TAPSE (M-mode): 2.0 cm LEFT ATRIUM             Index        RIGHT ATRIUM           Index LA diam:        4.20 cm 2.35 cm/m   RA Area:     13.80 cm LA Vol (A2C):   47.3 ml 26.43 ml/m  RA Volume:   30.40 ml  16.99 ml/m LA Vol (A4C):   37.6 ml 21.01 ml/m LA Biplane Vol: 43.3 ml 24.20 ml/m  AORTIC VALVE AV Area (Vmax):    2.93 cm AV Area (Vmean):   2.64 cm AV Area (VTI):     2.61 cm AV Vmax:           115.00 cm/s AV Vmean:          82.450 cm/s AV VTI:            0.256 m AV Peak Grad:      5.3 mmHg AV Mean Grad:  3.0 mmHg LVOT Vmax:         97.25 cm/s LVOT Vmean:        62.850 cm/s LVOT VTI:          0.193 m LVOT/AV VTI ratio: 0.75  AORTA Ao Root diam: 3.50 cm Ao Asc diam:  3.40 cm MITRAL VALVE               TRICUSPID VALVE MV Area (PHT): 3.00 cm    TR Peak grad:   27.9 mmHg MV Area VTI:   2.84 cm    TR Vmax:        264.00 cm/s MV Peak grad:  2.2 mmHg MV Mean grad:  1.0 mmHg    SHUNTS MV Vmax:       0.74 m/s    Systemic VTI:  0.19 m MV Vmean:      51.1 cm/s   Systemic Diam: 2.10 cm MV Decel Time: 253 msec MV E velocity: 63.40 cm/s MV A  velocity: 79.60 cm/s MV E/A ratio:  0.80 Weston Brass MD Electronically signed by Weston Brass MD Signature Date/Time: 02/15/2023/3:00:43 PM    Final (Updated)    CT Angio Chest PE W and/or Wo Contrast  Result Date: 02/14/2023 CLINICAL DATA:  Indigestion, chest pain, history of diffuse large B-cell lymphoma EXAM: CT ANGIOGRAPHY CHEST WITH CONTRAST TECHNIQUE: Multidetector CT imaging of the chest was performed using the standard protocol during bolus administration of intravenous contrast. Multiplanar CT image reconstructions and MIPs were obtained to evaluate the vascular anatomy. RADIATION DOSE REDUCTION: This exam was performed according to the departmental dose-optimization program which includes automated exposure control, adjustment of the mA and/or kV according to patient size and/or use of iterative reconstruction technique. CONTRAST:  75mL OMNIPAQUE IOHEXOL 350 MG/ML SOLN COMPARISON:  PET-CT dated 12/25/2022 FINDINGS: Cardiovascular: Satisfactory opacification of the bilateral pulmonary arteries to the segmental level. No evidence of pulmonary embolism. Although not tailored for evaluation of the thoracic aorta, there is no evidence of thoracic aortic aneurysm or dissection. Atherosclerotic calcifications of the aortic arch. Cardiomegaly.  No pericardial effusion.  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 Right chest port terminates at the cavoatrial junction. Mediastinum/Nodes: No suspicious mediastinal lymphadenopathy. Visualized thyroid is unremarkable. Lungs/Pleura: Evaluation of the lung parenchyma is constrained by respiratory motion. Within that constraint, there are clustered posterior left upper lobe nodules measuring up to 7 mm (series 12/image 46), new from PET, likely reflecting mild infection/inflammation. Superimposed patchy opacities in the lingula and bilateral lower lobes, suggesting pneumonia. 10 mm solid nodule in the  central right upper lobe (series 12/image 51), non FDG avid on prior PET but favored to be mildly progressive on the current study (previously measured as 5 mm but might measure as much as 8 mm in retrospect). No pleural effusion or pneumothorax. Upper Abdomen: Visualized upper abdomen is grossly unremarkable. Musculoskeletal: Visualized osseous structures are within normal limits. Review of the MIP images confirms the above findings. IMPRESSION: No evidence of pulmonary embolism. Lingular and bilateral lower lobe pneumonia. 10 mm central right upper lobe nodule, mildly progressive. Early primary bronchogenic carcinoma is possible. Follow-up CT chest is suggested in 3 months. Electronically Signed   By: Charline Bills M.D.   On: 02/14/2023 17:28   DG Chest Port 1 View  Result Date: 02/14/2023 CLINICAL DATA:  Chest pain. EXAM: PORTABLE CHEST 1 VIEW COMPARISON:  September 26, 2016. FINDINGS: The heart size and mediastinal contours are within normal limits. Left-sided defibrillator  is unchanged. Right internal jugular Port-A-Cath is now noted with distal tip in expected position of cavoatrial junction. Hypoinflation of the lungs is noted with mild bibasilar subsegmental atelectasis. The visualized skeletal structures are unremarkable. IMPRESSION: Hypoinflation of the lungs with mild bibasilar subsegmental atelectasis. Electronically Signed   By: Lupita Raider M.D.   On: 02/14/2023 15:11     Assessment and Plan:   Chest pain with neg MI, echo unchanged from 11/2022, and + PNA as cause of atypical pain.  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  From CTA is seems stent in LCX.  If no further chest pain  continue BB and eliquis, statin  - pt's back pain is better after injection pre-hospital.  Discussed if with return to regular activity and pain occurs to go to ER or Call Dr. Lubertha Basque office.  ICD near ERI with episodic atrially pacing.  Plan for appt with Dr. Ladona Ridgel to  discuss change out 02/27/23.   NSVT 5 beats and 9 beats - pt is unaware when occurs.  Mg+ 2.0 K+ 3.4 - replacing by IM  ICM with EF 50% stable - euvolemic continue coreg, altace.  PAF maintaining SR on eliquis and coreg continue    Lymphoma on chemo per oncology  Lung nodules will need follow up   Risk Assessment/Risk Scores:                For questions or updates, please contact [facility] Please consult [email or web address] for contact info under    Signed, Nada Boozer, NP  02/16/2023 7:55 AM

## 2023-02-17 ENCOUNTER — Inpatient Hospital Stay: Payer: Medicare HMO

## 2023-02-17 VITALS — BP 115/59 | HR 80 | Temp 98.6°F | Resp 18

## 2023-02-17 DIAGNOSIS — Z7189 Other specified counseling: Secondary | ICD-10-CM

## 2023-02-17 DIAGNOSIS — Z5189 Encounter for other specified aftercare: Secondary | ICD-10-CM | POA: Diagnosis not present

## 2023-02-17 DIAGNOSIS — C8331 Diffuse large B-cell lymphoma, lymph nodes of head, face, and neck: Secondary | ICD-10-CM | POA: Diagnosis not present

## 2023-02-17 DIAGNOSIS — C8338 Diffuse large B-cell lymphoma, lymph nodes of multiple sites: Secondary | ICD-10-CM

## 2023-02-17 MED ORDER — PEGFILGRASTIM-CBQV 6 MG/0.6ML ~~LOC~~ SOSY
6.0000 mg | PREFILLED_SYRINGE | Freq: Once | SUBCUTANEOUS | Status: AC
  Administered 2023-02-17: 6 mg via SUBCUTANEOUS
  Filled 2023-02-17: qty 0.6

## 2023-02-17 NOTE — Patient Instructions (Addendum)

## 2023-02-19 DIAGNOSIS — T451X5A Adverse effect of antineoplastic and immunosuppressive drugs, initial encounter: Secondary | ICD-10-CM

## 2023-02-19 NOTE — Progress Notes (Signed)
Marland Kitchen  HEMATOLOGY/ONCOLOGY INPATIENT PROGRESS NOTE  Date of Service:02/16/2023  Inpatient Attending: .No att. providers found   SUBJECTIVE  Patient was seen in oncologic follow-up for her large B-cell lymphoma.  Today of her cycle 6-day 3 of etoposide she developed stabbing central chest pain while eating breakfast emergency room and was admitted. She was noted to have bilateral lower lobe and lingular community-acquired pneumonia and was started on IV antibiotics.  She was also ruled out for ACS. She missed her cycle 6-day 3 of etoposide which we discussed we shall omit. He is due for her posttreatment G-CSF which is important to avoid her becoming neutropenic especially in the context of pneumonia.  She notes that her breathing is much better and she is off oxygen and ambulating comfortably.  Discussed with Dr. Deno Etienne who intends to evaluate her and possibly discharge her today. We have scheduled her for her G-CSF in clinic on Saturday, 02/17/2023.    OBJECTIVE:  NAD  PHYSICAL EXAMINATION: . Vitals:   02/15/23 2032 02/16/23 0608 02/16/23 0839 02/16/23 1141  BP: (!) 110/55 (!) 123/53 115/60 (!) 98/49  Pulse: 77 80 72 77  Resp: 18 18 16 17   Temp: 97.8 F (36.6 C) 98 F (36.7 C) 98.4 F (36.9 C) 98.6 F (37 C)  TempSrc: Oral Oral Oral Oral  SpO2: 96% 94% 96% 95%  Weight:      Height:       Filed Weights   02/14/23 1323  Weight: 150 lb (68 kg)   .Body mass index is 23.49 kg/m.  GENERAL:alert, in no acute distress and comfortable OROPHARYNX:no exudate, no erythema and lips, buccal mucosa, and tongue normal  NECK: supple, no JVD, thyroid normal size, non-tender, without nodularity LYMPH:  no palpable lymphadenopathy in the cervical, axillary or inguinal LUNGS: Few scattered rhonchi HEART: regular rate & rhythm,  no murmurs and no lower extremity edema ABDOMEN: abdomen soft, non-tender, normoactive bowel sounds  Musculoskeletal: no cyanosis of digits and no clubbing  PSYCH:  alert & oriented x 3 with fluent speech NEURO: no focal motor/sensory deficits  MEDICAL HISTORY:  Past Medical History:  Diagnosis Date   AICD (automatic cardioverter/defibrillator) present dual   [employer] ---  original placedment 2007/  generator change 2014 by dr Sharlot Gowda taylor   Anemia    Anticoagulant long-term use    eliquis   Anxiety    Arthralgia of multiple joints    Arthritis pain    Benign hypertensive heart disease    CAD (coronary artery disease) primary cardiologist-- dr gregg taylor   MI and 2 stents 1995 in [place]   Cancer [facility])    CHF NYHA class II, chronic, systolic (HCC)    followed by dr Sharlot Gowda taylor   Degenerative disc disease, lumbar    Depression    Dyslipidemia    History of basal cell carcinoma (BCC) excision    right ankle area s/p  excision in office 06/ 2019  in office   History of DVT of lower extremity yrs ago before 2012   History of MI (myocardial infarction) 1995  in [place]   History of pulmonary embolus (PE) 2012   History of ventricular tachycardia    Hyperlipidemia    Hypersomnia    Hypertension    Ischemic cardiomyopathy    Migraines    Myocardial infarction (HCC)    OA (osteoarthritis)    "all over"   Obstructive apnea    PAF (paroxysmal atrial fibrillation) (HCC)    Primary localized osteoarthritis  of right hip 12/18/2017   Primary localized osteoarthritis of right knee 05/28/2018   S/P coronary artery stent placement 1995   in [facility]   05-21-2018 per pt x2  stents in same coronary artery (unsure BM or DES)   Vitamin D deficiency disease     SURGICAL HISTORY: Past Surgical History:  Procedure Laterality Date   BIOPSY  06/19/2022   Procedure: BIOPSY;  Surgeon: Kathi Der, MD;  Location: [facility];  Service: Gastroenterology;;   CARDIAC DEFIBRILLATOR PLACEMENT  2007   CATARACT EXTRACTION, BILATERAL     COLONOSCOPY  04/14/2013   colonic polyp, status post polypectomy. Mild panocolonic diverticulosis. Small internal  hemorrhoids   DILATION AND CURETTAGE OF UTERUS  yrs ago   DIRECT LARYNGOSCOPY Right 10/04/2022   Procedure: DIRECT LARYNGOSCOPY;  Surgeon: Cheron Schaumann A, DO;  Location: [facility] OR;  Service: ENT;  Laterality: Right;   ESOPHAGOGASTRODUODENOSCOPY (EGD) WITH PROPOFOL N/A 06/19/2022   Procedure: ESOPHAGOGASTRODUODENOSCOPY (EGD) WITH PROPOFOL;  Surgeon: Kathi Der, MD;  Location: [facility];  Service: Gastroenterology;  Laterality: N/A;   IMPACTION REMOVAL  06/19/2022   Procedure: IMPACTION REMOVAL;  Surgeon: Kathi Der, MD;  Location: [facility];  Service: Gastroenterology;;   IMPLANTABLE CARDIOVERTER DEFIBRILLATOR GENERATOR CHANGE N/A 02/04/2013   Procedure: IMPLANTABLE CARDIOVERTER DEFIBRILLATOR GENERATOR CHANGE;  Surgeon: Marinus Maw, MD;  Location: [facility] CATH LAB;  Service: Cardiovascular;  Laterality: N/A;   IR IMAGING GUIDED PORT INSERTION  10/27/2022   LYMPH NODE BIOPSY Right 10/04/2022   Procedure: EXCISIONAL OF RIGHT DEEP CERVICAL LYMPH NODE;  Surgeon: Laren Boom, DO;  Location: [facility];  Service: ENT;  Laterality: Right;   SHOULDER ARTHROSCOPY Right 2015   TOTAL HIP ARTHROPLASTY Right 12/18/2017   Procedure: RIGHT TOTAL HIP ARTHROPLASTY;  Surgeon: Teryl Lucy, MD;  Location: [facility] OR;  Service: Orthopedics;  Laterality: Right;   TOTAL HIP ARTHROPLASTY Left 04/19/2021   Procedure: TOTAL HIP ARTHROPLASTY;  Surgeon: Teryl Lucy, MD;  Location: [facility] ORS;  Service: Orthopedics;  Laterality: Left;   TOTAL KNEE ARTHROPLASTY Right 05/28/2018   Procedure: TOTAL KNEE ARTHROPLASTY;  Surgeon: Teryl Lucy, MD;  Location: [facility];  Service: Orthopedics;  Laterality: Right;  Adductor Block   TOTAL KNEE ARTHROPLASTY Left 08/12/2021   Procedure: TOTAL KNEE ARTHROPLASTY;  Surgeon: Teryl Lucy, MD;  Location: [facility];  Service: Orthopedics;  Laterality: Left;   TUBAL LIGATION Bilateral yrs ago   WISDOM TOOTH EXTRACTION      SOCIAL HISTORY: Social History   Socioeconomic  History   Marital status: Single    Spouse name: Not on file   Number of children: Not on file   Years of education: Not on file   Highest education level: Not on file  Occupational History   Not on file  Tobacco Use   Smoking status: Former    Current packs/day: 0.00    Types: Cigarettes    Start date: 05/21/1966    Quit date: 05/21/1996    Years since quitting: 26.7   Smokeless tobacco: Never  Vaping Use   Vaping status: Never Used  Substance and Sexual Activity   Alcohol use: No   Drug use: Never   Sexual activity: Not on file  Other Topics Concern   Not on file  Social History Narrative   Not on file   Social Determinants of Health   Financial Resource Strain: Low Risk  (05/28/2018)   Overall Financial Resource Strain (CARDIA)    Difficulty of Paying Living Expenses: Not hard at all  Food Insecurity: No Food Insecurity (02/15/2023)   Hunger Vital Sign    Worried About Running Out of Food in the Last Year: Never true    Ran Out of Food in the Last Year: Never true  Transportation Needs: No Transportation Needs (02/15/2023)   PRAPARE - Administrator, Civil Service (Medical): No    Lack of Transportation (Non-Medical): No  Physical Activity: Not on file  Stress: Not on file  Social Connections: Not on file  Intimate Partner Violence: Unknown (02/15/2023)   Humiliation, Afraid, Rape, and Kick questionnaire    Fear of Current or Ex-Partner: No    Emotionally Abused: No    Physically Abused: Not on file    Sexually Abused: No    FAMILY HISTORY: Family History  Problem Relation Age of Onset   Hypertension Mother    Thyroid disease Mother    Alzheimer's disease Mother    Coronary artery disease Father    Pulmonary embolism Father    Congestive Heart Failure Maternal Grandmother    Hypertension Maternal Grandmother    Heart attack Maternal Grandfather    Other Maternal Grandfather        carotid disease   Dementia Paternal Grandmother    Other  Paternal Grandfather 27       accident    ALLERGIES:  is allergic to sumatriptan succinate and amitriptyline.  MEDICATIONS:  Scheduled Meds: Continuous Infusions: PRN Meds:.  REVIEW OF SYSTEMS:    10 Point review of Systems was done is negative except as noted above.   LABORATORY DATA:  I have reviewed the data as listed  .    Latest Ref Rng & Units 02/16/2023    2:59 AM 02/15/2023    5:30 AM 02/14/2023    1:33 PM  CBC  WBC 4.0 - 10.5 K/uL 2.3  5.1  8.2   Hemoglobin 12.0 - 15.0 g/dL 9.2  9.6  40.9   Hematocrit 36.0 - 46.0 % 28.7  30.7  35.7   Platelets 150 - 400 K/uL 85  93  117     .    Latest Ref Rng & Units 02/16/2023    2:59 AM 02/15/2023    5:30 AM 02/14/2023    1:33 PM  CMP  Glucose 70 - 99 mg/dL 89  99  811   BUN 8 - 23 mg/dL 12  16  20    Creatinine 0.44 - 1.00 mg/dL 9.14  7.82  9.56   Sodium 135 - 145 mmol/L 135  139  137   Potassium 3.5 - 5.1 mmol/L 3.4  3.4  3.3   Chloride 98 - 111 mmol/L 100  105  106   CO2 22 - 32 mmol/L 26  26  23    Calcium 8.9 - 10.3 mg/dL 7.9  8.8  8.8   Total Protein 6.5 - 8.1 g/dL  5.7  6.2   Total Bilirubin 0.3 - 1.2 mg/dL  0.9  1.0   Alkaline Phos 38 - 126 U/L  56  67   AST 15 - 41 U/L  14  22   ALT 0 - 44 U/L  18  21      RADIOGRAPHIC STUDIES: I have personally reviewed the radiological images as listed and agreed with the findings in the report. ECHOCARDIOGRAM COMPLETE  Result Date: 02/15/2023    ECHOCARDIOGRAM REPORT   Patient Name:   TIEARA AREVALOS Date of Exam: 02/15/2023 Medical Rec #:  [id number]  Height:       67.0 in Accession #:    [id number]      Weight:       150.0 lb Date of Birth:  [birth year]       BSA:          1.790 m Patient Age:    76 years        BP:           104/48 mmHg Patient Gender: F               HR:           72 bpm. Exam Location:  Inpatient Procedure: 2D Echo, Cardiac Doppler, Color Doppler and Intracardiac            Opacification Agent STAT ECHO Indications:     Chest pain  History:         Patient has  prior history of Echocardiogram examinations, most                  recent 11/08/2022. Cardiomyopathy and CHF, Previous Myocardial                  Infarction and CAD, Arrythmias:Atrial Fibrillation,                  Signs/Symptoms:Chest Pain; Risk Factors:Dyslipidemia, Former                  Smoker, Sleep Apnea and Hypertension. Hx PE, hx DVT, lymphoma.  Sonographer:     Wallie Char Referring Phys:  [id number] Tonye Royalty Diagnosing Phys: Weston Brass MD IMPRESSIONS  1. Left ventricular ejection fraction, by estimation, is 50%. The left ventricle has low normal function. The left ventricle demonstrates regional wall motion abnormalities unchanged in location from prior study on side by side review. Left ventricular diastolic parameters are consistent with Grade I diastolic dysfunction (impaired relaxation).  2. Right ventricular systolic function is normal. The right ventricular size is normal. There is mildly elevated pulmonary artery systolic pressure. The estimated right ventricular systolic pressure is 42.9 mmHg.  3. The mitral valve is grossly normal. Trivial mitral valve regurgitation. No evidence of mitral stenosis.  4. The aortic valve is normal in structure. Aortic valve regurgitation is not visualized. No aortic stenosis is present.  5. The inferior vena cava is dilated in size with <50% respiratory variability, suggesting right atrial pressure of 15 mmHg. Conclusion(s)/Recommendation(s): No left ventricular mural or apical thrombus/thrombi. FINDINGS  Left Ventricle: Left ventricular ejection fraction, by estimation, is 50%. The left ventricle has low normal function. The left ventricle demonstrates regional wall motion abnormalities. Definity contrast agent was given IV to delineate the left ventricular endocardial borders. The left ventricular internal cavity size was normal in size. There is borderline left ventricular hypertrophy. Left ventricular diastolic parameters are consistent with Grade  I diastolic dysfunction (impaired relaxation).  LV Wall Scoring: The mid and distal inferior wall and apical septal segment are hypokinetic. Right Ventricle: The right ventricular size is normal. No increase in right ventricular wall thickness. Right ventricular systolic function is normal. There is mildly elevated pulmonary artery systolic pressure. The tricuspid regurgitant velocity is 2.64  m/s, and with an assumed right atrial pressure of 15 mmHg, the estimated right ventricular systolic pressure is 42.9 mmHg. Left Atrium: Left atrial size was normal in size. Right Atrium: Right atrial size was normal in size. Pericardium: There is no evidence of pericardial effusion. Mitral Valve: The mitral valve is grossly normal.  Trivial mitral valve regurgitation. No evidence of mitral valve stenosis. MV peak gradient, 2.2 mmHg. The mean mitral valve gradient is 1.0 mmHg. Tricuspid Valve: The tricuspid valve is grossly normal. Tricuspid valve regurgitation is mild . No evidence of tricuspid stenosis. Aortic Valve: The aortic valve is normal in structure. Aortic valve regurgitation is not visualized. No aortic stenosis is present. Aortic valve mean gradient measures 3.0 mmHg. Aortic valve peak gradient measures 5.3 mmHg. Aortic valve area, by VTI measures 2.61 cm. Pulmonic Valve: The pulmonic valve was grossly normal. Pulmonic valve regurgitation is trivial. Aorta: The aortic root is normal in size and structure. Venous: The inferior vena cava was not well visualized. The inferior vena cava is dilated in size with less than 50% respiratory variability, suggesting right atrial pressure of 15 mmHg. IAS/Shunts: The interatrial septum was not well visualized. Additional Comments: A device lead is visualized in the right atrium and right ventricle.  LEFT VENTRICLE PLAX 2D LVIDd:         5.00 cm      Diastology LVIDs:         3.70 cm      LV e' medial:    8.12 cm/s LV PW:         0.90 cm      LV E/e' medial:  7.8 LV IVS:         1.00 cm      LV e' lateral:   10.20 cm/s LVOT diam:     2.10 cm      LV E/e' lateral: 6.2 LV SV:         67 LV SV Index:   37 LVOT Area:     3.46 cm  LV Volumes (MOD) LV vol d, MOD A2C: 113.0 ml LV vol d, MOD A4C: 138.0 ml LV vol s, MOD A2C: 62.1 ml LV vol s, MOD A4C: 62.7 ml LV SV MOD A2C:     50.9 ml LV SV MOD A4C:     138.0 ml LV SV MOD BP:      64.2 ml RIGHT VENTRICLE             IVC RV Basal diam:  3.20 cm     IVC diam: 2.30 cm RV S prime:     15.30 cm/s TAPSE (M-mode): 2.0 cm LEFT ATRIUM             Index        RIGHT ATRIUM           Index LA diam:        4.20 cm 2.35 cm/m   RA Area:     13.80 cm LA Vol (A2C):   47.3 ml 26.43 ml/m  RA Volume:   30.40 ml  16.99 ml/m LA Vol (A4C):   37.6 ml 21.01 ml/m LA Biplane Vol: 43.3 ml 24.20 ml/m  AORTIC VALVE AV Area (Vmax):    2.93 cm AV Area (Vmean):   2.64 cm AV Area (VTI):     2.61 cm AV Vmax:           115.00 cm/s AV Vmean:          82.450 cm/s AV VTI:            0.256 m AV Peak Grad:      5.3 mmHg AV Mean Grad:      3.0 mmHg LVOT Vmax:         97.25 cm/s LVOT Vmean:  62.850 cm/s LVOT VTI:          0.193 m LVOT/AV VTI ratio: 0.75  AORTA Ao Root diam: 3.50 cm Ao Asc diam:  3.40 cm MITRAL VALVE               TRICUSPID VALVE MV Area (PHT): 3.00 cm    TR Peak grad:   27.9 mmHg MV Area VTI:   2.84 cm    TR Vmax:        264.00 cm/s MV Peak grad:  2.2 mmHg MV Mean grad:  1.0 mmHg    SHUNTS MV Vmax:       0.74 m/s    Systemic VTI:  0.19 m MV Vmean:      51.1 cm/s   Systemic Diam: 2.10 cm MV Decel Time: 253 msec MV E velocity: 63.40 cm/s MV A velocity: 79.60 cm/s MV E/A ratio:  0.80 Weston Brass MD Electronically signed by Weston Brass MD Signature Date/Time: 02/15/2023/3:00:43 PM    Final (Updated)    CT Angio Chest PE W and/or Wo Contrast  Result Date: 02/14/2023 CLINICAL DATA:  Indigestion, chest pain, history of diffuse large B-cell lymphoma EXAM: CT ANGIOGRAPHY CHEST WITH CONTRAST TECHNIQUE: Multidetector CT imaging of the chest was performed  using the standard protocol during bolus administration of intravenous contrast. Multiplanar CT image reconstructions and MIPs were obtained to evaluate the vascular anatomy. RADIATION DOSE REDUCTION: This exam was performed according to the departmental dose-optimization program which includes automated exposure control, adjustment of the mA and/or kV according to patient size and/or use of iterative reconstruction technique. CONTRAST:  75mL OMNIPAQUE IOHEXOL 350 MG/ML SOLN COMPARISON:  PET-CT dated 12/25/2022 FINDINGS: Cardiovascular: Satisfactory opacification of the bilateral pulmonary arteries to the segmental level. No evidence of pulmonary embolism. Although not tailored for evaluation of the thoracic aorta, there is no evidence of thoracic aortic aneurysm or dissection. Atherosclerotic calcifications of the aortic arch. Cardiomegaly.  No pericardial effusion.  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 Right chest port terminates at the cavoatrial junction. Mediastinum/Nodes: No suspicious mediastinal lymphadenopathy. Visualized thyroid is unremarkable. Lungs/Pleura: Evaluation of the lung parenchyma is constrained by respiratory motion. Within that constraint, there are clustered posterior left upper lobe nodules measuring up to 7 mm (series 12/image 46), new from PET, likely reflecting mild infection/inflammation. Superimposed patchy opacities in the lingula and bilateral lower lobes, suggesting pneumonia. 10 mm solid nodule in the central right upper lobe (series 12/image 51), non FDG avid on prior PET but favored to be mildly progressive on the current study (previously measured as 5 mm but might measure as much as 8 mm in retrospect). No pleural effusion or pneumothorax. Upper Abdomen: Visualized upper abdomen is grossly unremarkable. Musculoskeletal: Visualized osseous structures are within normal limits. Review of the MIP images confirms the  above findings. IMPRESSION: No evidence of pulmonary embolism. Lingular and bilateral lower lobe pneumonia. 10 mm central right upper lobe nodule, mildly progressive. Early primary bronchogenic carcinoma is possible. Follow-up CT chest is suggested in 3 months. Electronically Signed   By: Charline Bills M.D.   On: 02/14/2023 17:28   DG Chest Port 1 View  Result Date: 02/14/2023 CLINICAL DATA:  Chest pain. EXAM: PORTABLE CHEST 1 VIEW COMPARISON:  September 26, 2016. FINDINGS: The heart size and mediastinal contours are within normal limits. Left-sided defibrillator is unchanged. Right internal jugular Port-A-Cath is now noted with distal tip in expected position of cavoatrial junction. Hypoinflation of the lungs is  noted with mild bibasilar subsegmental atelectasis. The visualized skeletal structures are unremarkable. IMPRESSION: Hypoinflation of the lungs with mild bibasilar subsegmental atelectasis. Electronically Signed   By: Lupita Raider M.D.   On: 02/14/2023 15:11   CUP PACEART REMOTE DEVICE CHECK  Result Date: 02/06/2023 Monthly battery check.  Estimated 35mo Normal device function. No new alerts. Follow up as scheduled monthly LA, CV[facility]   ASSESSMENT & PLAN:   77 year old with  1. 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 -Pre-treatment PET scan from 09/04/2022 showed a large FDG avid mass centered within the left base of tongue and left lingual region which extends into the hypopharyngeal region ventral to the epiglottis. A smaller focus of increased uptake localizes to the right pharyngeal tonsil region.Bilateral tracer avid cervical and supraclavicular lymph nodes compatible with nodal metastasis.There is a 7 mm tracer avid nodule within the anteromedial left upper lobe which is suspicious for  pulmonary metastasis.There is increased uptake identified along the long axis of the posterior right seventh rib. No corresponding lytic or sclerotic changes identified on the CT images. Multiple prominent retroperitoneal and mesenteric lymph nodes are identified which exhibit mild tracer uptake. This is a nonspecific finding and may reflect reactive adenopathy. Metastatic adenopathy cannot be excluded.  -Patient started R-CEOP on 10/31/2022 with a plan for a total of 6 cycles.    2.  Hospitalized for community-acquired pneumonia on IV Rocephin and azithromycin.  Currently on room air.  3.  Mild leukopenia thrombocytopenia and anemia related to chemotherapy. PLAN: -Reviewed available labs with the patient in detail and imaging studies. -We will plan to skip cycle 6-day 3 of etoposide which she missed due to hospitalization. -She is likely discharging today and we have set her up for outpatient G-CSF in the cancer clinic on 02/17/2023. -We will see her back 2 to 3 weeks with port flush and labs for toxicity check after her last chemotherapy. -Will plan for outpatient PET CT scan in about 4 to 5 weeks since she recently had a pneumonia and this could cause false readings. -Explained the plan in detail with the patient and she is agreeable to this.  .The total time spent in the appointment was 35 minutes* .  All of the patient's questions were answered with apparent satisfaction. The patient knows to call the clinic with any problems, questions or concerns.   Wyvonnia Lora MD MS AAHIVMS [facility] Hematology/Oncology Physician [facility]  .*Total Encounter Time as defined by the Centers for Medicare and Medicaid Services includes, in addition to the face-to-face time of a patient visit (documented in the note above) non-face-to-face time: obtaining and reviewing outside history, ordering and reviewing medications, tests or procedures, care coordination (communications with other health  care professionals or caregivers) and documentation in the medical record.

## 2023-02-20 ENCOUNTER — Other Ambulatory Visit: Payer: Self-pay

## 2023-02-22 NOTE — Addendum Note (Signed)
Addended by: Geralyn Flash D on: 02/22/2023 02:15 PM   Modules accepted: Level of Service

## 2023-02-22 NOTE — Progress Notes (Signed)
Remote ICD transmission.   

## 2023-02-24 ENCOUNTER — Other Ambulatory Visit: Payer: Self-pay

## 2023-02-26 ENCOUNTER — Inpatient Hospital Stay (HOSPITAL_COMMUNITY): Admission: RE | Admit: 2023-02-26 | Payer: Medicare HMO | Source: Ambulatory Visit

## 2023-02-26 NOTE — Telephone Encounter (Signed)
noted 

## 2023-02-27 ENCOUNTER — Ambulatory Visit: Payer: Medicare HMO | Attending: Internal Medicine | Admitting: Internal Medicine

## 2023-02-27 ENCOUNTER — Encounter: Payer: Self-pay | Admitting: Internal Medicine

## 2023-02-27 VITALS — BP 132/70 | HR 92 | Ht 67.0 in | Wt 151.0 lb

## 2023-02-27 DIAGNOSIS — I255 Ischemic cardiomyopathy: Secondary | ICD-10-CM

## 2023-02-27 LAB — CUP PACEART INCLINIC DEVICE CHECK
Date Time Interrogation Session: 20240820200641
Implantable Lead Connection Status: 753985
Implantable Lead Connection Status: 753985
Implantable Lead Implant Date: 20071003
Implantable Lead Implant Date: 20071019
Implantable Lead Location: 753859
Implantable Lead Location: 753860
Implantable Lead Model: 5076
Implantable Lead Model: 6947
Implantable Pulse Generator Implant Date: 20140729

## 2023-02-27 NOTE — Progress Notes (Signed)
HPI [name] returns today for followup. She is a pleasant 77 yo woman with an ICM, PAF, s/p ICD insertion. She has done well in the interim with no chest pain or sob. No syncope. No ICD therapies.  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 She has had 8 cycles and a repeat echo done a few weeks ago showed her EF is 50%. She has never had any ICD therapies and paces in the atrium 5% of the time.  Allergies  Allergen Reactions   Sumatriptan Succinate Other (See Comments)    Chest pain, no triptans, pt states "it makes my heart race"   Amitriptyline Other (See Comments)    Weight gain     Current Outpatient Medications  Medication Sig Dispense Refill   acetaminophen (TYLENOL) 650 MG CR tablet Take 1,300 mg by mouth every 8 (eight) hours as needed for pain.     acyclovir (ZOVIRAX) 400 MG tablet TAKE 1 TABLET BY MOUTH TWICE A DAY 180 tablet 2   allopurinol (ZYLOPRIM) 300 MG tablet TAKE 0.5 TABLETS BY MOUTH DAILY. 45 tablet 1   apixaban (ELIQUIS) 5 MG TABS tablet Take 1 tablet (5 mg total) by mouth 2 (two) times daily.     azithromycin (ZITHROMAX) 250 MG tablet 1 tab po daily x 4 more days, zero refills 4 each 0   b complex vitamins capsule Take 1 capsule by mouth daily.     Calcium Carb-Cholecalciferol (CALCIUM 600 + D PO) Take 1 tablet by mouth daily.     carvedilol (COREG) 12.5 MG tablet Take 12.5 mg by mouth 2 (two) times daily with a meal.      Cholecalciferol (VITAMIN D3) 125 MCG (5000 UT) capsule Take 5,000 Units by mouth daily.     cyanocobalamin (VITAMIN B12) 1000 MCG tablet Take 1,000 mcg by mouth daily.     FIBER PO Take 1 capsule by mouth 2 (two) times daily.     Fiber POWD Take 1 Scoop by mouth daily.     gabapentin (NEURONTIN) 300 MG capsule Take 300 mg by mouth 3 (three) times daily.     loratadine (CLARITIN) 10 MG tablet Take 10 mg by mouth daily.     Omega-3 Fatty Acids (FISH OIL) 1000 MG CAPS Take 1,000 mg by mouth daily.       pantoprazole (PROTONIX) 40 MG tablet Take 1 tablet (40 mg total) by mouth 2 (two) times daily. 180 tablet 3   predniSONE (DELTASONE) 20 MG tablet Take 3 tablets (60 mg total) by mouth daily with breakfast. Take for 5 days now x 1 course and then for 5 days after each cycle of chemotherapy (Patient taking differently: Take 60 mg by mouth daily with breakfast. Takes for 5 days after chemo) 15 tablet 6   ramipril (ALTACE) 2.5 MG tablet Take 2.5 mg by mouth daily.       simvastatin (ZOCOR) 40 MG tablet Take 40 mg by mouth at bedtime.       tiZANidine (ZANAFLEX) 4 MG tablet Take 2 mg by mouth daily as needed for muscle spasms.     traZODone (DESYREL) 50 MG tablet Take 50 mg by mouth at bedtime.      venlafaxine (EFFEXOR) 75 MG tablet Take 75 mg by mouth daily.     vitamin E 400 UNIT capsule Take 400 Units by mouth daily.     zonisamide (ZONEGRAN) 50 MG capsule Take 50-100 mg by mouth See admin  instructions. Take 50mg  by mouth in the morning and 100mg  at night.     No current facility-administered medications for this visit.     Past Medical History:  Diagnosis Date   AICD (automatic cardioverter/defibrillator) present dual   Medtronic ---  original placedment 2007/  generator change 2014 by dr Elpidio Thielen   Anemia    Anticoagulant long-term use    eliquis   Anxiety    Arthralgia of multiple joints    Arthritis pain    Benign hypertensive heart disease    CAD (coronary artery disease) primary cardiologist-- dr Coda Mathey   MI and 2 stents 1995 in [place]   Cancer [facility])    CHF NYHA class II, chronic, systolic (HCC)    followed by dr Rachel Gowda Jakyiah Briones   Degenerative disc disease, lumbar    Depression    Dyslipidemia    History of basal cell carcinoma (BCC) excision    right ankle area s/p  excision in office 06/ 2019  in office   History of DVT of lower extremity yrs ago before 2012   History of MI (myocardial infarction) 1995  in [place]   History of pulmonary embolus (PE) 2012    History of ventricular tachycardia    Hyperlipidemia    Hypersomnia    Hypertension    Ischemic cardiomyopathy    Migraines    Myocardial infarction (HCC)    OA (osteoarthritis)    "all over"   Obstructive apnea    PAF (paroxysmal atrial fibrillation) (HCC)    Primary localized osteoarthritis of right hip 12/18/2017   Primary localized osteoarthritis of right knee 05/28/2018   S/P coronary artery stent placement 1995   in [facility]   05-21-2018 per pt x2  stents in same coronary artery (unsure BM or DES)   Vitamin D deficiency disease     ROS:   All systems reviewed and negative except as noted in the HPI.   Past Surgical History:  Procedure Laterality Date   BIOPSY  06/19/2022   Procedure: BIOPSY;  Surgeon: Kathi Der, MD;  Location: [facility];  Service: Gastroenterology;;   CARDIAC DEFIBRILLATOR PLACEMENT  2007   CATARACT EXTRACTION, BILATERAL     COLONOSCOPY  04/14/2013   colonic polyp, status post polypectomy. Mild panocolonic diverticulosis. Small internal hemorrhoids   DILATION AND CURETTAGE OF UTERUS  yrs ago   DIRECT LARYNGOSCOPY Right 10/04/2022   Procedure: DIRECT LARYNGOSCOPY;  Surgeon: Cheron Schaumann A, DO;  Location: [facility];  Service: ENT;  Laterality: Right;   ESOPHAGOGASTRODUODENOSCOPY (EGD) WITH PROPOFOL N/A 06/19/2022   Procedure: ESOPHAGOGASTRODUODENOSCOPY (EGD) WITH PROPOFOL;  Surgeon: Kathi Der, MD;  Location: [facility];  Service: Gastroenterology;  Laterality: N/A;   IMPACTION REMOVAL  06/19/2022   Procedure: IMPACTION REMOVAL;  Surgeon: Kathi Der, MD;  Location: [facility];  Service: Gastroenterology;;   IMPLANTABLE CARDIOVERTER DEFIBRILLATOR GENERATOR CHANGE N/A 02/04/2013   Procedure: IMPLANTABLE CARDIOVERTER DEFIBRILLATOR GENERATOR CHANGE;  Surgeon: Marinus Maw, MD;  Location: [facility] CATH LAB;  Service: Cardiovascular;  Laterality: N/A;   IR IMAGING GUIDED PORT INSERTION  10/27/2022   LYMPH NODE BIOPSY Right 10/04/2022    Procedure: EXCISIONAL OF RIGHT DEEP CERVICAL LYMPH NODE;  Surgeon: Laren Boom, DO;  Location: [facility];  Service: ENT;  Laterality: Right;   SHOULDER ARTHROSCOPY Right 2015   TOTAL HIP ARTHROPLASTY Right 12/18/2017   Procedure: RIGHT TOTAL HIP ARTHROPLASTY;  Surgeon: Teryl Lucy, MD;  Location: [facility];  Service: Orthopedics;  Laterality: Right;   TOTAL  HIP ARTHROPLASTY Left 04/19/2021   Procedure: TOTAL HIP ARTHROPLASTY;  Surgeon: Teryl Lucy, MD;  Location: [facility];  Service: Orthopedics;  Laterality: Left;   TOTAL KNEE ARTHROPLASTY Right 05/28/2018   Procedure: TOTAL KNEE ARTHROPLASTY;  Surgeon: Teryl Lucy, MD;  Location: [facility];  Service: Orthopedics;  Laterality: Right;  Adductor Block   TOTAL KNEE ARTHROPLASTY Left 08/12/2021   Procedure: TOTAL KNEE ARTHROPLASTY;  Surgeon: Teryl Lucy, MD;  Location: [facility];  Service: Orthopedics;  Laterality: Left;   TUBAL LIGATION Bilateral yrs ago   WISDOM TOOTH EXTRACTION       Family History  Problem Relation Age of Onset   Hypertension Mother    Thyroid disease Mother    Alzheimer's disease Mother    Coronary artery disease Father    Pulmonary embolism Father    Congestive Heart Failure Maternal Grandmother    Hypertension Maternal Grandmother    Heart attack Maternal Grandfather    Other Maternal Grandfather        carotid disease   Dementia Paternal Grandmother    Other Paternal Grandfather 48       accident     Social History   Socioeconomic History   Marital status: Single    Spouse name: Not on file   Number of children: Not on file   Years of education: Not on file   Highest education level: Not on file  Occupational History   Not on file  Tobacco Use   Smoking status: Former    Current packs/day: 0.00    Types: Cigarettes    Start date: 05/21/1966    Quit date: 05/21/1996    Years since quitting: 26.7   Smokeless tobacco: Never  Vaping Use   Vaping status: Never Used  Substance and Sexual Activity    Alcohol use: No   Drug use: Never   Sexual activity: Not on file  Other Topics Concern   Not on file  Social History Narrative   Not on file   Social Determinants of Health   Financial Resource Strain: Low Risk  (05/28/2018)   Overall Financial Resource Strain (CARDIA)    Difficulty of Paying Living Expenses: Not hard at all  Food Insecurity: No Food Insecurity (02/15/2023)   Hunger Vital Sign    Worried About Running Out of Food in the Last Year: Never true    Ran Out of Food in the Last Year: Never true  Transportation Needs: No Transportation Needs (02/15/2023)   PRAPARE - Administrator, Civil Service (Medical): No    Lack of Transportation (Non-Medical): No  Physical Activity: Not on file  Stress: Not on file  Social Connections: Not on file  Intimate Partner Violence: Unknown (02/15/2023)   Humiliation, Afraid, Rape, and Kick questionnaire    Fear of Current or Ex-Partner: No    Emotionally Abused: No    Physically Abused: Not on file    Sexually Abused: No     BP 132/70   Pulse 92   Ht 5\' 7"  (1.702 m)   Wt 151 lb (68.5 kg)   SpO2 96%   BMI 23.65 kg/m   Physical Exam:  Well appearing NAD HEENT: Unremarkable Neck:  No JVD, no thyromegally Lymphatics:  No adenopathy Back:  No CVA tenderness Lungs:  Clear HEART:  Regular rate rhythm, no murmurs, no rubs, no clicks Abd:  soft, positive bowel sounds, no organomegally, no rebound, no guarding Ext:  2 plus pulses, no edema, no cyanosis, no clubbing Skin:  No rashes no nodules Neuro:  CN II through XII intact, motor grossly intact  DEVICE  Normal device function.  See PaceArt for details. Approaching ERI.  Assess/Plan: ICM - she has a repeat echo of 50%. I have discussed the treatment options and recommend watchful waiting.  ICD - she is almost at [facility]. I discussed the treatment options and we will tentatively plan to hold off on ICD gen change as her EF is improved and she has never had any tachy  therapies.  Sinus brady - she is pacing 5%. I have reprogrammed her to VVI 30/min. We will see how much she paces.  Rachel Easterly,MD  1. ICM - she denies anginal symptoms. She will continue her current meds. 2. ICD - her medtronic PPM is working normally. 3. VT - she has not had any recurrent sustained VT since her last visit. 4. PAF - she is remaining in NSR. She will continue her eliquis.   Rachel Gowda Finnbar Cedillos,MD

## 2023-02-27 NOTE — Patient Instructions (Addendum)
Medication Instructions:  Your physician recommends that you continue on your current medications as directed. Please refer to the Current Medication list given to you today.  *If you need a refill on your cardiac medications before your next appointment, please call your pharmacy*  Lab Work: None ordered.  If you have labs (blood work) drawn today and your tests are completely normal, you will receive your results only by: MyChart Message (if you have MyChart) OR A paper copy in the mail If you have any lab test that is abnormal or we need to change your treatment, we will call you to review the results.  Testing/Procedures: None ordered.  Follow-Up: At [facility], you and your health needs are our priority.  As part of our continuing mission to provide you with exceptional heart care, we have created designated Provider Care Teams.  These Care Teams include your primary Cardiologist (physician) and Advanced Practice Providers (APPs -  Physician Assistants and Nurse Practitioners) who all work together to provide you with the care you need, when you need it.   Your next appointment:   5 months  The format for your next appointment:   In Person  Provider:   Lewayne Bunting, MD{or one of the following Advanced Practice Providers on your designated Care Team:   Francis Dowse, [place] Casimiro Needle "Mardelle Matte" [place], [place] Earnest Rosier, NP   Important Information About Sugar

## 2023-03-02 ENCOUNTER — Other Ambulatory Visit: Payer: Self-pay

## 2023-03-02 DIAGNOSIS — C8338 Diffuse large B-cell lymphoma, lymph nodes of multiple sites: Secondary | ICD-10-CM

## 2023-03-05 ENCOUNTER — Inpatient Hospital Stay (HOSPITAL_BASED_OUTPATIENT_CLINIC_OR_DEPARTMENT_OTHER): Payer: Medicare HMO | Admitting: Hematology

## 2023-03-05 ENCOUNTER — Inpatient Hospital Stay: Payer: Medicare HMO

## 2023-03-05 VITALS — BP 121/73 | HR 79 | Temp 97.5°F | Wt 143.6 lb

## 2023-03-05 DIAGNOSIS — C8338 Diffuse large B-cell lymphoma, lymph nodes of multiple sites: Secondary | ICD-10-CM

## 2023-03-05 DIAGNOSIS — C8331 Diffuse large B-cell lymphoma, lymph nodes of head, face, and neck: Secondary | ICD-10-CM | POA: Diagnosis not present

## 2023-03-05 DIAGNOSIS — Z5189 Encounter for other specified aftercare: Secondary | ICD-10-CM | POA: Diagnosis not present

## 2023-03-05 LAB — CBC WITH DIFFERENTIAL (CANCER CENTER ONLY)
Abs Immature Granulocytes: 0.01 10*3/uL (ref 0.00–0.07)
Basophils Absolute: 0 10*3/uL (ref 0.0–0.1)
Basophils Relative: 1 %
Eosinophils Absolute: 0 10*3/uL (ref 0.0–0.5)
Eosinophils Relative: 0 %
HCT: 35.7 % — ABNORMAL LOW (ref 36.0–46.0)
Hemoglobin: 11.5 g/dL — ABNORMAL LOW (ref 12.0–15.0)
Immature Granulocytes: 0 %
Lymphocytes Relative: 11 %
Lymphs Abs: 0.4 10*3/uL — ABNORMAL LOW (ref 0.7–4.0)
MCH: 31 pg (ref 26.0–34.0)
MCHC: 32.2 g/dL (ref 30.0–36.0)
MCV: 96.2 fL (ref 80.0–100.0)
Monocytes Absolute: 0.4 10*3/uL (ref 0.1–1.0)
Monocytes Relative: 9 %
Neutro Abs: 2.9 10*3/uL (ref 1.7–7.7)
Neutrophils Relative %: 79 %
Platelet Count: 104 10*3/uL — ABNORMAL LOW (ref 150–400)
RBC: 3.71 MIL/uL — ABNORMAL LOW (ref 3.87–5.11)
RDW: 14.8 % (ref 11.5–15.5)
WBC Count: 3.7 10*3/uL — ABNORMAL LOW (ref 4.0–10.5)
nRBC: 0 % (ref 0.0–0.2)

## 2023-03-05 LAB — CMP (CANCER CENTER ONLY)
ALT: 13 U/L (ref 0–44)
AST: 16 U/L (ref 15–41)
Albumin: 3.8 g/dL (ref 3.5–5.0)
Alkaline Phosphatase: 67 U/L (ref 38–126)
Anion gap: 5 (ref 5–15)
BUN: 17 mg/dL (ref 8–23)
CO2: 26 mmol/L (ref 22–32)
Calcium: 9.1 mg/dL (ref 8.9–10.3)
Chloride: 107 mmol/L (ref 98–111)
Creatinine: 0.68 mg/dL (ref 0.44–1.00)
GFR, Estimated: >60 mL/min (ref >60)
Glucose, Bld: 110 mg/dL — ABNORMAL HIGH (ref 70–99)
Potassium: 3.6 mmol/L (ref 3.5–5.1)
Sodium: 138 mmol/L (ref 135–145)
Total Bilirubin: 0.4 mg/dL (ref 0.3–1.2)
Total Protein: 5.9 g/dL — ABNORMAL LOW (ref 6.5–8.1)

## 2023-03-05 NOTE — Progress Notes (Signed)
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 female who has been referred to [place] by Rachel Perches, DO for evaluation and management of non hodgkin's lymphoma. She has a hx of chronic systolic heart failure.  Nasolaryngoscopy revealed a mass lesion of the left oropharynx, emanating from the left base of tongue/glossotonsillar sulcus. Patient subsequently had a CT neck with contrast performed on 08/09/2022, which reported extensive bilateral adenopaty, with greatest lymph node conglomerate measuring 3.2 x 2.4 cm in the right level 2. Patient underwent core needle biopsy on 08/18/22 and endorsed persistent throat pain and odynophagia at the time. She is a former smoker and quit in the late 90s. SHe endorsed heavy tobacco use prior to quitting, with 1.5 pack/day for about 30 years.   Today, she is accompanied by her daughter. She complains of a sore throat beginning around Thanksgiving time. She was given antibiotics and prednisone which did not improve symptoms. She endorses swelling in her neck with associated soreness which came quickly past 2-3 weeks. She reports that her neck edema has recently grown on both the left and right sides. Her neck pain is in a specific area. She also complains of recent her neck pain if she turns her neck.  She did present Emergency EMT because something was stuck in her throat. Endoscopy revealed that it was food and it was pushed down. An ulcer was also found in her esophagus and some narrowing which will be stretched in three  months. She has not had any issues with swallowing food since then. She has not had a biopsy of issue on the back of her tongue. No other lumps/bumps, unexplained fevers, chills, night sweat, change in breathing, abdominal pain, or skin rashes. She reports discomfort with drinking tea which caused her to eat less as well.   She is allergic to Sumatriptan and another medication she cannot recall. She does not consume alcohol and has quit smoking in the 90s. 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 She believes the sore was caused by shoe discomfort.  She denies any facial puffiness, abdominal pain, recent change in bowel habits or urination. She reports that her defibrillator has never gone off.   She reports a ganglion cyst on her right knee, 2 knee replacements, and 2 hip replacements. She denies a Fhx of blood disorders. However, her sister has lung cancer and was a frequent smoker.  She is UTD with her vaccinations, incuding influenza, RSV, and COVID-19 booster. She has endorsed migraines since childhood. She lives on her own is able to complete daily activities independently. She will follow-up with Dr. Marene Lenz soon. She follows up with cardiologist regularly. Her a fib, pacemaker, and ICD device have been stable. She has not had a recent echo.   INTERVAL HISTORY:  Rachel Bates is a 77 y.o. female who presents today for follow up and management of diffuse large B-cell lymphoma.  Patient was last seen by me as in-patient on 02/16/2023 and she was noted to have bilateral  lower lobe and lingular community-acquired pneumonia and was treated with IV antibiotics.   Patient is accompanied by a family member during this visit. She notes she has been doing well overall without any new or severe medical concerns after getting discharged from the hospital. She notes she has recovered well from pneumonia. She  denies any fever, shortness of breath, or any other respiratory symptoms.   She denies any new infection issues, chills, night sweats, unexpected weight loss, abdominal pain, chest pain, back pain, or leg swelling. She has been eating well, staying well hydrated, and exercises regularly.   MEDICAL HISTORY:  Past Medical History:  Diagnosis Date   AICD (automatic cardioverter/defibrillator) present dual   [employer] ---  original placedment 2007/  generator change 2014 by dr gregg taylor   Anemia    Anticoagulant long-term use    eliquis   Anxiety    Arthralgia of multiple joints    Arthritis pain    Benign hypertensive heart disease    CAD (coronary artery disease) primary cardiologist-- dr gregg taylor   MI and 2 stents 1995 in [place]   Cancer [facility])    CHF NYHA class II, chronic, systolic (HCC)    followed by dr Sharlot Gowda taylor   Degenerative disc disease, lumbar    Depression    Dyslipidemia    History of basal cell carcinoma (BCC) excision    right ankle area s/p  excision in office 06/ 2019  in office   History of DVT of lower extremity yrs ago before 2012   History of MI (myocardial infarction) 1995  in [place]   History of pulmonary embolus (PE) 2012   History of ventricular tachycardia    Hyperlipidemia    Hypersomnia    Hypertension    Ischemic cardiomyopathy    Migraines    Myocardial infarction (HCC)    OA (osteoarthritis)    "all over"   Obstructive apnea    PAF (paroxysmal atrial fibrillation) (HCC)    Primary localized osteoarthritis of right hip 12/18/2017   Primary localized osteoarthritis of right knee 05/28/2018   S/P coronary artery stent placement 1995   in [facility]   05-21-2018 per pt x2  stents in same coronary artery (unsure BM or DES)   Vitamin D deficiency disease     SURGICAL HISTORY: Past Surgical History:  Procedure Laterality Date   BIOPSY  06/19/2022   Procedure: BIOPSY;  Surgeon: Kathi Der, MD;  Location: [facility];  Service:  Gastroenterology;;   CARDIAC DEFIBRILLATOR PLACEMENT  2007   CATARACT EXTRACTION, BILATERAL     COLONOSCOPY  04/14/2013   colonic polyp, status post polypectomy. Mild panocolonic diverticulosis. Small internal hemorrhoids   DILATION AND CURETTAGE OF UTERUS  yrs ago   DIRECT LARYNGOSCOPY Right 10/04/2022   Procedure: DIRECT LARYNGOSCOPY;  Surgeon: Cheron Schaumann A, DO;  Location: [facility];  Service: ENT;  Laterality: Right;   ESOPHAGOGASTRODUODENOSCOPY (EGD) WITH PROPOFOL N/A 06/19/2022   Procedure: ESOPHAGOGASTRODUODENOSCOPY (EGD) WITH PROPOFOL;  Surgeon: Kathi Der, MD;  Location: [facility];  Service: Gastroenterology;  Laterality: N/A;   IMPACTION REMOVAL  06/19/2022   Procedure: IMPACTION REMOVAL;  Surgeon: Kathi Der, MD;  Location: [facility];  Service: Gastroenterology;;   IMPLANTABLE CARDIOVERTER DEFIBRILLATOR GENERATOR CHANGE N/A 02/04/2013   Procedure: IMPLANTABLE CARDIOVERTER DEFIBRILLATOR GENERATOR CHANGE;  Surgeon: Marinus Maw, MD;  Location: [facility] CATH LAB;  Service: Cardiovascular;  Laterality: N/A;   IR IMAGING GUIDED PORT INSERTION  10/27/2022   LYMPH NODE BIOPSY Right 10/04/2022  Procedure: EXCISIONAL OF RIGHT DEEP CERVICAL LYMPH NODE;  Surgeon: Laren Boom, DO;  Location: [facility] OR;  Service: ENT;  Laterality: Right;   SHOULDER ARTHROSCOPY Right 2015   TOTAL HIP ARTHROPLASTY Right 12/18/2017   Procedure: RIGHT TOTAL HIP ARTHROPLASTY;  Surgeon: Teryl Lucy, MD;  Location: [facility];  Service: Orthopedics;  Laterality: Right;   TOTAL HIP ARTHROPLASTY Left 04/19/2021   Procedure: TOTAL HIP ARTHROPLASTY;  Surgeon: Teryl Lucy, MD;  Location: [facility];  Service: Orthopedics;  Laterality: Left;   TOTAL KNEE ARTHROPLASTY Right 05/28/2018   Procedure: TOTAL KNEE ARTHROPLASTY;  Surgeon: Teryl Lucy, MD;  Location: [facility];  Service: Orthopedics;  Laterality: Right;  Adductor Block   TOTAL KNEE ARTHROPLASTY Left 08/12/2021   Procedure: TOTAL KNEE ARTHROPLASTY;   Surgeon: Teryl Lucy, MD;  Location: [facility];  Service: Orthopedics;  Laterality: Left;   TUBAL LIGATION Bilateral yrs ago   WISDOM TOOTH EXTRACTION      SOCIAL HISTORY: Social History   Socioeconomic History   Marital status: Single    Spouse name: Not on file   Number of children: Not on file   Years of education: Not on file   Highest education level: Not on file  Occupational History   Not on file  Tobacco Use   Smoking status: Former    Current packs/day: 0.00    Types: Cigarettes    Start date: 05/21/1966    Quit date: 05/21/1996    Years since quitting: 26.8   Smokeless tobacco: Never  Vaping Use   Vaping status: Never Used  Substance and Sexual Activity   Alcohol use: No   Drug use: Never   Sexual activity: Not on file  Other Topics Concern   Not on file  Social History Narrative   Not on file   Social Determinants of Health   Financial Resource Strain: Low Risk  (05/28/2018)   Overall Financial Resource Strain (CARDIA)    Difficulty of Paying Living Expenses: Not hard at all  Food Insecurity: No Food Insecurity (02/15/2023)   Hunger Vital Sign    Worried About Running Out of Food in the Last Year: Never true    Ran Out of Food in the Last Year: Never true  Transportation Needs: No Transportation Needs (02/15/2023)   PRAPARE - Administrator, Civil Service (Medical): No    Lack of Transportation (Non-Medical): No  Physical Activity: Not on file  Stress: Not on file  Social Connections: Not on file  Intimate Partner Violence: Unknown (02/15/2023)   Humiliation, Afraid, Rape, and Kick questionnaire    Fear of Current or Ex-Partner: No    Emotionally Abused: No    Physically Abused: Not on file    Sexually Abused: No    FAMILY HISTORY: Family History  Problem Relation Age of Onset   Hypertension Mother    Thyroid disease Mother    Alzheimer's disease Mother    Coronary artery disease Father    Pulmonary embolism Father    Congestive Heart  Failure Maternal Grandmother    Hypertension Maternal Grandmother    Heart attack Maternal Grandfather    Other Maternal Grandfather        carotid disease   Dementia Paternal Grandmother    Other Paternal Grandfather 42       accident    ALLERGIES:  is allergic to sumatriptan succinate and amitriptyline.  MEDICATIONS:  Current Outpatient Medications  Medication Sig Dispense Refill   acetaminophen (TYLENOL) 650 MG CR tablet  Take 1,300 mg by mouth every 8 (eight) hours as needed for pain.     acyclovir (ZOVIRAX) 400 MG tablet TAKE 1 TABLET BY MOUTH TWICE A DAY 180 tablet 2   allopurinol (ZYLOPRIM) 300 MG tablet TAKE 0.5 TABLETS BY MOUTH DAILY. 45 tablet 1   apixaban (ELIQUIS) 5 MG TABS tablet Take 1 tablet (5 mg total) by mouth 2 (two) times daily.     azithromycin (ZITHROMAX) 250 MG tablet 1 tab po daily x 4 more days, zero refills 4 each 0   b complex vitamins capsule Take 1 capsule by mouth daily.     Calcium Carb-Cholecalciferol (CALCIUM 600 + D PO) Take 1 tablet by mouth daily.     carvedilol (COREG) 12.5 MG tablet Take 12.5 mg by mouth 2 (two) times daily with a meal.      Cholecalciferol (VITAMIN D3) 125 MCG (5000 UT) capsule Take 5,000 Units by mouth daily.     cyanocobalamin (VITAMIN B12) 1000 MCG tablet Take 1,000 mcg by mouth daily.     FIBER PO Take 1 capsule by mouth 2 (two) times daily.     Fiber POWD Take 1 Scoop by mouth daily.     gabapentin (NEURONTIN) 300 MG capsule Take 300 mg by mouth 3 (three) times daily.     loratadine (CLARITIN) 10 MG tablet Take 10 mg by mouth daily.     Omega-3 Fatty Acids (FISH OIL) 1000 MG CAPS Take 1,000 mg by mouth daily.      pantoprazole (PROTONIX) 40 MG tablet Take 1 tablet (40 mg total) by mouth 2 (two) times daily. 180 tablet 3   predniSONE (DELTASONE) 20 MG tablet Take 3 tablets (60 mg total) by mouth daily with breakfast. Take for 5 days now x 1 course and then for 5 days after each cycle of chemotherapy (Patient taking differently:  Take 60 mg by mouth daily with breakfast. Takes for 5 days after chemo) 15 tablet 6   ramipril (ALTACE) 2.5 MG tablet Take 2.5 mg by mouth daily.       simvastatin (ZOCOR) 40 MG tablet Take 40 mg by mouth at bedtime.       tiZANidine (ZANAFLEX) 4 MG tablet Take 2 mg by mouth daily as needed for muscle spasms.     traZODone (DESYREL) 50 MG tablet Take 50 mg by mouth at bedtime.      venlafaxine (EFFEXOR) 75 MG tablet Take 75 mg by mouth daily.     vitamin E 400 UNIT capsule Take 400 Units by mouth daily.     zonisamide (ZONEGRAN) 50 MG capsule Take 50-100 mg by mouth See admin instructions. Take 50mg  by mouth in the morning and 100mg  at night.     No current facility-administered medications for this visit.    REVIEW OF SYSTEMS:    10 Point review of Systems was done is negative except as noted above.   PHYSICAL EXAMINATION: ECOG PERFORMANCE STATUS: 1 - Symptomatic but completely ambulatory .BP 121/73   Pulse 79   Temp (!) 97.5 F (36.4 C)   Wt 143 lb 9.6 oz (65.1 kg)   SpO2 97%   BMI 22.49 kg/m   GENERAL:alert, in no acute distress and comfortable SKIN: no acute rashes, no significant lesions EYES: conjunctiva are pink and non-injected, sclera anicteric OROPHARYNX: MMM, no exudates, no oropharyngeal erythema or ulceration NECK: supple, no JVD LYMPH:  no palpable lymphadenopathy in the cervical, axillary or inguinal regions LUNGS: clear to auscultation b/l with normal respiratory effort  HEART: regular rate & rhythm ABDOMEN:  normoactive bowel sounds , non tender, not distended. Extremity: no pedal edema PSYCH: alert & oriented x 3 with fluent speech NEURO: no focal motor/sensory deficits   LABORATORY DATA:  I have reviewed the data as listed    Latest Ref Rng & Units 03/05/2023    2:30 PM 02/16/2023    2:59 AM 02/15/2023    5:30 AM  CBC  WBC 4.0 - 10.5 K/uL 3.7  2.3  5.1   Hemoglobin 12.0 - 15.0 g/dL 86.5  9.2  9.6   Hematocrit 36.0 - 46.0 % 35.7  28.7  30.7   Platelets  150 - 400 K/uL 104  85  93       Latest Ref Rng & Units 03/05/2023    2:30 PM 02/16/2023    2:59 AM 02/15/2023    5:30 AM  CMP  Glucose 70 - 99 mg/dL 784  89  99   BUN 8 - 23 mg/dL 17  12  16    Creatinine 0.44 - 1.00 mg/dL 6.96  2.95  2.84   Sodium 135 - 145 mmol/L 138  135  139   Potassium 3.5 - 5.1 mmol/L 3.6  3.4  3.4   Chloride 98 - 111 mmol/L 107  100  105   CO2 22 - 32 mmol/L 26  26  26    Calcium 8.9 - 10.3 mg/dL 9.1  7.9  8.8   Total Protein 6.5 - 8.1 g/dL 5.9   5.7   Total Bilirubin 0.3 - 1.2 mg/dL 0.4   0.9   Alkaline Phos 38 - 126 U/L 67   56   AST 15 - 41 U/L 16   14   ALT 0 - 44 U/L 13   18    Lab Results  Component Value Date   LDH 239 (H) 11/15/2022   Molecular Pathology 10/21/2022:    Surgical Pathology 10/04/22: A. LYMPH NODE, RIGHT LEVEL 2 DEEP CERVICAL, EXCISION:  -Diffuse large B-cell lymphoma  -See comment COMMENT: The sections show diffuse effacement of the lymph nodal architecture primarily by a population of large lymphoid cells characterized by vesicular chromatin and small nucleoli associated with apoptosis and brisk mitosis.  In some areas, the large atypical lymphoid proliferation extends into the perinodal adipose tissue.  In this background, there are scattered variably sized and somewhat disrupted aggregates of primarily small lymphoid cells characterized by high nuclear cytoplasmic ratio, angulated nuclear contours and small to inconspicuous nucleoli. Flow cytometric analysis was performed [facility] [id number]) and shows a monoclonal, lambda restricted B-cell population expressing CD10.  In addition, immunohistochemical stains for CD3, CD5, CD10, CD20, PAX5, BCL6, Bcl-2, Ki-67, CD30, CD138, CD21, EBV in addition to in situ hybridization for kappa and lambda were performed with appropriate controls.  The large lymphoid cells are positive for CD20, PAX5, CD10, BCL6, Bcl-2, and partially for cytoplasmic lambda.  No significant staining is seen with EBV,  CD30, CD138 or cytoplasmic kappa.  Ki67 shows variably increased expression (more than 50% in some areas). CD21 highlights scattered somewhat disrupted follicular dendritic networks. The lymphoid aggregates of primarily small lymphoid cells previously described show positivity for B-cell markers CD20 and PAX5 in addition to CD10 and Bcl-2.  There is an admixed variable T-cell component in the background as seen with CD3 and CD5 and there is no apparent co-expression of CD5 in B-cell areas.  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   RADIOGRAPHIC STUDIES: I have personally reviewed the radiological images as listed and agreed with the findings in the report. CUP PACEART INCLINIC DEVICE CHECK  Result Date: 02/27/2023 ICD check in clinic. Normal device function. Thresholds and sensing consistent with previous device measurements. Impedance trends stable over time. AT/AF burden <.1%, 2 short NSVT.  Histogram distribution appropriate for patient and level of activity. Estimated longevity <1 month to ERI. Patient has not received any therapy since initial implant in 2007, also there is improved EF.  She does not require pacing support.  Dr. Ladona Ridgel reviewed with patient that she does not meet criteria to need device and given that she is coming up on replacement time, would consider turning off defib and pacer support in near future and just allow battery to run out.  (see Dr. Lubertha Basque encounter note for full details of discussion).  Patient did agree to a stepped down approach with adjusting alerts,  turning down pacing, and adjusting shock window and re-evaluate with Dr. Ladona Ridgel in 6months.  We also will discontinue remote monitoring.  Expectation is that the battery will just run out in the interim before next follow up.  Patient verbalizes understanding.   PROGRAMMING CHANGES MADE TODAY: 1.  VF Detection Interval changed from 320 ms (188 bpm) to 250 ms (240 bpm) 2.  Changed to VVI 30 3.  VT Detection turned Off 4. Mode AAIR/DDDR to VVI and Lower Rate 60 bpm 30 bpm 5. Rate Hysteresis Off 6.  Low Battery Voltage Alert TURNED Off 7.  Excessive Charge Time Alert TURNED Off 8.  Low Battery Voltage Alert TURNED Off Turned off the following: 1 = Patient Alert (Tone) 2 = Clinic Alert ([facility]  SEE ATTACHMENT FOR FULL INTERROGATIN DETAILS. MW,RN.Syliva Overman, RN  ECHOCARDIOGRAM COMPLETE  Result Date: 02/15/2023    ECHOCARDIOGRAM REPORT   Patient Name:   DAEJHA HEMMING Date of Exam: 02/15/2023 Medical Rec #:  [id number]       Height:       67.0 in Accession #:    [id number]      Weight:       150.0 lb Date of Birth:  11-12-1945       BSA:          1.790 m Patient Age:    76 years        BP:           104/48 mmHg Patient Gender: F               HR:           72 bpm. Exam Location:  Inpatient Procedure: 2D Echo, Cardiac Doppler, Color Doppler and Intracardiac            Opacification Agent STAT ECHO Indications:     Chest pain  History:         Patient has prior history of Echocardiogram examinations, most                  recent 11/08/2022. Cardiomyopathy and CHF, Previous Myocardial                  Infarction and CAD, Arrythmias:Atrial Fibrillation,                  Signs/Symptoms:Chest Pain; Risk Factors:Dyslipidemia, Former                  Smoker, Sleep Apnea and Hypertension. Hx  PE, hx DVT, lymphoma.  Sonographer:     Wallie Char Referring Phys:  [id number] Tonye Royalty Diagnosing Phys: Weston Brass MD IMPRESSIONS  1. Left ventricular ejection fraction, by estimation, is 50%. The left ventricle has low normal function. The left ventricle demonstrates regional wall motion abnormalities unchanged in location from prior study on side by side review. Left ventricular diastolic parameters are consistent with Grade I diastolic dysfunction (impaired relaxation).  2.  Right ventricular systolic function is normal. The right ventricular size is normal. There is mildly elevated pulmonary artery systolic pressure. The estimated right ventricular systolic pressure is 42.9 mmHg.  3. The mitral valve is grossly normal. Trivial mitral valve regurgitation. No evidence of mitral stenosis.  4. The aortic valve is normal in structure. Aortic valve regurgitation is not visualized. No aortic stenosis is present.  5. The inferior vena cava is dilated in size with <50% respiratory variability, suggesting right atrial pressure of 15 mmHg. Conclusion(s)/Recommendation(s): No left ventricular mural or apical thrombus/thrombi. FINDINGS  Left Ventricle: Left ventricular ejection fraction, by estimation, is 50%. The left ventricle has low normal function. The left ventricle demonstrates regional wall motion abnormalities. Definity contrast agent was given IV to delineate the left ventricular endocardial borders. The left ventricular internal cavity size was normal in size. There is borderline left ventricular hypertrophy. Left ventricular diastolic parameters are consistent with Grade I diastolic dysfunction (impaired relaxation).  LV Wall Scoring: The mid and distal inferior wall and apical septal segment are hypokinetic. Right Ventricle: The right ventricular size is normal. No increase in right ventricular wall thickness. Right ventricular systolic function is normal. There is mildly elevated pulmonary artery systolic pressure. The tricuspid regurgitant velocity is 2.64  m/s, and with an assumed right atrial pressure of 15 mmHg, the estimated right ventricular systolic pressure is 42.9 mmHg. Left Atrium: Left atrial size was normal in size. Right Atrium: Right atrial size was normal in size. Pericardium: There is no evidence of pericardial effusion. Mitral Valve: The mitral valve is grossly normal. Trivial mitral valve regurgitation. No evidence of mitral valve stenosis. MV peak gradient, 2.2  mmHg. The mean mitral valve gradient is 1.0 mmHg. Tricuspid Valve: The tricuspid valve is grossly normal. Tricuspid valve regurgitation is mild . No evidence of tricuspid stenosis. Aortic Valve: The aortic valve is normal in structure. Aortic valve regurgitation is not visualized. No aortic stenosis is present. Aortic valve mean gradient measures 3.0 mmHg. Aortic valve peak gradient measures 5.3 mmHg. Aortic valve area, by VTI measures 2.61 cm. Pulmonic Valve: The pulmonic valve was grossly normal. Pulmonic valve regurgitation is trivial. Aorta: The aortic root is normal in size and structure. Venous: The inferior vena cava was not well visualized. The inferior vena cava is dilated in size with less than 50% respiratory variability, suggesting right atrial pressure of 15 mmHg. IAS/Shunts: The interatrial septum was not well visualized. Additional Comments: A device lead is visualized in the right atrium and right ventricle.  LEFT VENTRICLE PLAX 2D LVIDd:         5.00 cm      Diastology LVIDs:         3.70 cm      LV e' medial:    8.12 cm/s LV PW:         0.90 cm      LV E/e' medial:  7.8 LV IVS:        1.00 cm      LV e' lateral:   10.20 cm/s LVOT diam:  2.10 cm      LV E/e' lateral: 6.2 LV SV:         67 LV SV Index:   37 LVOT Area:     3.46 cm  LV Volumes (MOD) LV vol d, MOD A2C: 113.0 ml LV vol d, MOD A4C: 138.0 ml LV vol s, MOD A2C: 62.1 ml LV vol s, MOD A4C: 62.7 ml LV SV MOD A2C:     50.9 ml LV SV MOD A4C:     138.0 ml LV SV MOD BP:      64.2 ml RIGHT VENTRICLE             IVC RV Basal diam:  3.20 cm     IVC diam: 2.30 cm RV S prime:     15.30 cm/s TAPSE (M-mode): 2.0 cm LEFT ATRIUM             Index        RIGHT ATRIUM           Index LA diam:        4.20 cm 2.35 cm/m   RA Area:     13.80 cm LA Vol (A2C):   47.3 ml 26.43 ml/m  RA Volume:   30.40 ml  16.99 ml/m LA Vol (A4C):   37.6 ml 21.01 ml/m LA Biplane Vol: 43.3 ml 24.20 ml/m  AORTIC VALVE AV Area (Vmax):    2.93 cm AV Area (Vmean):   2.64 cm  AV Area (VTI):     2.61 cm AV Vmax:           115.00 cm/s AV Vmean:          82.450 cm/s AV VTI:            0.256 m AV Peak Grad:      5.3 mmHg AV Mean Grad:      3.0 mmHg LVOT Vmax:         97.25 cm/s LVOT Vmean:        62.850 cm/s LVOT VTI:          0.193 m LVOT/AV VTI ratio: 0.75  AORTA Ao Root diam: 3.50 cm Ao Asc diam:  3.40 cm MITRAL VALVE               TRICUSPID VALVE MV Area (PHT): 3.00 cm    TR Peak grad:   27.9 mmHg MV Area VTI:   2.84 cm    TR Vmax:        264.00 cm/s MV Peak grad:  2.2 mmHg MV Mean grad:  1.0 mmHg    SHUNTS MV Vmax:       0.74 m/s    Systemic VTI:  0.19 m MV Vmean:      51.1 cm/s   Systemic Diam: 2.10 cm MV Decel Time: 253 msec MV E velocity: 63.40 cm/s MV A velocity: 79.60 cm/s MV E/A ratio:  0.80 Weston Brass MD Electronically signed by Weston Brass MD Signature Date/Time: 02/15/2023/3:00:43 PM    Final (Updated)    CT Angio Chest PE W and/or Wo Contrast  Result Date: 02/14/2023 CLINICAL DATA:  Indigestion, chest pain, history of diffuse large B-cell lymphoma EXAM: CT ANGIOGRAPHY CHEST WITH CONTRAST TECHNIQUE: Multidetector CT imaging of the chest was performed using the standard protocol during bolus administration of intravenous contrast. Multiplanar CT image reconstructions and MIPs were obtained to evaluate the vascular anatomy. RADIATION DOSE REDUCTION: This exam was performed according to the departmental dose-optimization program which includes automated exposure  control, adjustment of the mA and/or kV according to patient size and/or use of iterative reconstruction technique. CONTRAST:  75mL OMNIPAQUE IOHEXOL 350 MG/ML SOLN COMPARISON:  PET-CT dated 12/25/2022 FINDINGS: Cardiovascular: Satisfactory opacification of the bilateral pulmonary arteries to the segmental level. No evidence of pulmonary embolism. Although not tailored for evaluation of the thoracic aorta, there is no evidence of thoracic aortic aneurysm or dissection. Atherosclerotic calcifications of the  aortic arch. Cardiomegaly.  No pericardial effusion.  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 Right chest port terminates at the cavoatrial junction. Mediastinum/Nodes: No suspicious mediastinal lymphadenopathy. Visualized thyroid is unremarkable. Lungs/Pleura: Evaluation of the lung parenchyma is constrained by respiratory motion. Within that constraint, there are clustered posterior left upper lobe nodules measuring up to 7 mm (series 12/image 46), new from PET, likely reflecting mild infection/inflammation. Superimposed patchy opacities in the lingula and bilateral lower lobes, suggesting pneumonia. 10 mm solid nodule in the central right upper lobe (series 12/image 51), non FDG avid on prior PET but favored to be mildly progressive on the current study (previously measured as 5 mm but might measure as much as 8 mm in retrospect). No pleural effusion or pneumothorax. Upper Abdomen: Visualized upper abdomen is grossly unremarkable. Musculoskeletal: Visualized osseous structures are within normal limits. Review of the MIP images confirms the above findings. IMPRESSION: No evidence of pulmonary embolism. Lingular and bilateral lower lobe pneumonia. 10 mm central right upper lobe nodule, mildly progressive. Early primary bronchogenic carcinoma is possible. Follow-up CT chest is suggested in 3 months. Electronically Signed   By: Charline Bills M.D.   On: 02/14/2023 17:28   DG Chest Port 1 View  Result Date: 02/14/2023 CLINICAL DATA:  Chest pain. EXAM: PORTABLE CHEST 1 VIEW COMPARISON:  September 26, 2016. FINDINGS: The heart size and mediastinal contours are within normal limits. Left-sided defibrillator is unchanged. Right internal jugular Port-A-Cath is now noted with distal tip in expected position of cavoatrial junction. Hypoinflation of the lungs is noted with mild bibasilar subsegmental atelectasis. The visualized skeletal structures are unremarkable.  IMPRESSION: Hypoinflation of the lungs with mild bibasilar subsegmental atelectasis. Electronically Signed   By: Lupita Raider M.D.   On: 02/14/2023 15:11   CUP PACEART REMOTE DEVICE CHECK  Result Date: 02/06/2023 Monthly battery check.  Estimated 48mo Normal device function. No new alerts. Follow up as scheduled monthly [place], [facility]   NM PET 09/04/2022: IMPRESSION: 1. There is a large tracer avid mass centered within the left base of tongue and left lingual region which extends into the hypopharyngeal region ventral to the epiglottis. Imaging findings are compatible with a primary head and neck malignancy. 2. A smaller focus of increased uptake localizes to the right pharyngeal tonsil region. Indeterminate. 3. Bilateral tracer avid cervical and supraclavicular lymph nodes compatible with nodal metastasis. 4. There is a 7 mm tracer avid nodule within the anteromedial left upper lobe which is suspicious for pulmonary metastasis. 5. There is increased uptake identified along the long axis of the posterior right seventh rib. No corresponding lytic or sclerotic changes identified on the CT images. Cannot exclude bone metastases. 6. Multiple prominent retroperitoneal and mesenteric lymph nodes are identified which exhibit mild tracer uptake. This is a nonspecific finding and may reflect reactive adenopathy. Metastatic adenopathy cannot be excluded. Attention on future surveillance imaging is advised. 7.  Aortic Atherosclerosis (ICD10-I70.0).   ASSESSMENT & PLAN:   77 y.o.  female with:   1. 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 2. 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 CBC shows slightly decreased but improved WBC at 3.7 K, improved hemoglobin at 11.5 g/dL, improved hematocrit of 35.7%, and improved platelet count of 104 K. CMP is pending.  -Recommend to wear mask in crowds.  -Shingles vaccines after 6 months of chemotherapy.  -Recommend to get influenza vaccine, COVID-19 booster, and RSV vaccine.   -Patient's PET scan is scheduled for August 30th.  FOLLOW-UP: PET/CT as scheduled on 8/30 Phone visit with Dr Candise Che in 10-12 days  The total time spent in the appointment was 20 minutes* .  All of the patient's questions were answered with apparent satisfaction. The patient knows to call the clinic with any problems, questions or concerns.   Wyvonnia Lora MD MS AAHIVMS [facility] Hematology/Oncology Physician [facility]  .*Total Encounter Time as defined by the Centers for Medicare and Medicaid Services includes, in addition to the face-to-face time of a patient visit (documented in the note above) non-face-to-face time: obtaining and reviewing outside history, ordering and reviewing medications, tests or procedures, care coordination (communications with other health care professionals or caregivers) and documentation in the medical record.   I,Param Shah,acting as a Neurosurgeon for Wyvonnia Lora, MD.,have documented all relevant documentation on the behalf of Wyvonnia Lora, MD,as directed by  Wyvonnia Lora, MD while in the presence of Wyvonnia Lora, MD.  .I have reviewed the above documentation for accuracy and completeness, and I agree with the above. Johney Maine MD

## 2023-03-06 ENCOUNTER — Ambulatory Visit: Payer: Medicare Other

## 2023-03-08 DIAGNOSIS — M47816 Spondylosis without myelopathy or radiculopathy, lumbar region: Secondary | ICD-10-CM | POA: Diagnosis not present

## 2023-03-09 ENCOUNTER — Ambulatory Visit (HOSPITAL_COMMUNITY)
Admission: RE | Admit: 2023-03-09 | Discharge: 2023-03-09 | Disposition: A | Discharge status: Home / Self Care | Payer: Medicare HMO | Source: Ambulatory Visit | Attending: Hematology | Admitting: Hematology

## 2023-03-09 DIAGNOSIS — R918 Other nonspecific abnormal finding of lung field: Secondary | ICD-10-CM | POA: Diagnosis not present

## 2023-03-09 DIAGNOSIS — C8338 Diffuse large B-cell lymphoma, lymph nodes of multiple sites: Secondary | ICD-10-CM | POA: Diagnosis not present

## 2023-03-09 DIAGNOSIS — C8330 Diffuse large B-cell lymphoma, unspecified site: Secondary | ICD-10-CM | POA: Diagnosis not present

## 2023-03-09 LAB — GLUCOSE, CAPILLARY: Glucose-Capillary: 113 mg/dL — ABNORMAL HIGH (ref 70–99)

## 2023-03-09 MED ORDER — FLUDEOXYGLUCOSE F - 18 (FDG) INJECTION
7.5200 | Freq: Once | INTRAVENOUS | Status: AC
  Administered 2023-03-09: 7.52 via INTRAVENOUS

## 2023-03-12 ENCOUNTER — Encounter: Payer: Self-pay | Admitting: Hematology

## 2023-03-26 ENCOUNTER — Inpatient Hospital Stay: Payer: Medicare HMO | Attending: Hematology | Admitting: Hematology

## 2023-03-26 DIAGNOSIS — R911 Solitary pulmonary nodule: Secondary | ICD-10-CM | POA: Diagnosis not present

## 2023-03-26 DIAGNOSIS — C8338 Diffuse large B-cell lymphoma, lymph nodes of multiple sites: Secondary | ICD-10-CM

## 2023-03-26 NOTE — Progress Notes (Signed)
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 female who has been referred to [place] by Carylon Perches, DO for evaluation and management of non hodgkin's lymphoma. She has a hx of chronic systolic heart failure.  Nasolaryngoscopy revealed a mass lesion of the left oropharynx, emanating from the left base of tongue/glossotonsillar sulcus. Patient subsequently had a CT neck with contrast performed on 08/09/2022, which reported extensive bilateral adenopaty, with greatest lymph node conglomerate measuring 3.2 x 2.4 cm in the right level 2. Patient underwent core needle biopsy on 08/18/22 and endorsed persistent throat pain and odynophagia at the time. She is a former smoker and quit in the late 90s. SHe endorsed heavy tobacco use prior to quitting, with 1.5 pack/day for about 30 years.   Today, she is accompanied by her daughter. She complains of a sore throat beginning around Thanksgiving time. She was given antibiotics and prednisone which did not improve symptoms. She endorses swelling in her neck with associated soreness which came quickly past 2-3 weeks. She reports that her neck edema has recently grown on both the left and right sides. Her neck pain is in a specific area. She also complains of recent her neck pain if she turns her neck.  She did present Emergency EMT because something was stuck in her throat. Endoscopy revealed that it was food and it was pushed down. An ulcer was also found in her esophagus and some narrowing which will be stretched in  three months. She has not had any issues with swallowing food since then. She has not had a biopsy of issue on the back of her tongue. No other lumps/bumps, unexplained fevers, chills, night sweat, change in breathing, abdominal pain, or skin rashes. She reports discomfort with drinking tea which caused her to eat less as well.   She is allergic to Sumatriptan and another medication she cannot recall. She does not consume alcohol and has quit smoking in the 90s. 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 She believes the sore was caused by shoe discomfort.  She denies any facial puffiness, abdominal pain, recent change in bowel habits or urination. She reports that her defibrillator has never gone off.   She reports a ganglion cyst on her right knee, 2 knee replacements, and 2 hip replacements. She denies a Fhx of blood disorders. However, her sister has lung cancer and was a frequent smoker.  She is UTD with her vaccinations, incuding influenza, RSV, and COVID-19 booster. She has endorsed migraines since childhood. She lives on her own is able to complete daily activities independently. She will follow-up with Dr. Marene Lenz soon. She follows up with cardiologist regularly. Her a fib, pacemaker, and ICD device have been stable. She has not had a recent echo.   INTERVAL HISTORY:  Rachel Bates is a 77 y.o. female who presents today for follow up and management of diffuse large B-cell lymphoma.  .I connected with Rachel Bates on 03/26/2023 at  8:40 AM EDT by telephone visit  and verified that I am speaking with the correct person using two identifiers.   Patient was last seen by me on 03/05/2023 and she was doing well overall.  Patient notes she has been doing well overall without any new or severe medical concerns since our last visit. She notes that her respiratory symptoms have improved since she was  discharged from the hospital. She denies any cough or other pneumonia-like symptoms.   She denies any recent falls. She denies any new infection issues, fever, chills, night sweats, abdominal pain, chest pain, back pain, or leg swelling.   I discussed the limitations, risks, security and privacy concerns of performing an evaluation and management service by telemedicine and the availability of in-person appointments. I also discussed with the patient that there may be a patient responsible charge related to this service. The patient expressed understanding and agreed to proceed.   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     MEDICAL HISTORY:  Past Medical History:  Diagnosis Date   AICD (automatic cardioverter/defibrillator) present dual   [employer] ---  original placedment 2007/  generator change 2014 by dr Sharlot Gowda taylor   Anemia    Anticoagulant long-term use    eliquis   Anxiety    Arthralgia of multiple joints    Arthritis pain    Benign hypertensive heart disease    CAD (coronary artery disease) primary cardiologist-- dr gregg taylor   MI and 2 stents 1995 in [place]   Cancer [facility])    CHF NYHA class II, chronic, systolic (HCC)    followed by dr Sharlot Gowda taylor   Degenerative disc disease, lumbar    Depression    Dyslipidemia    History of basal cell carcinoma (BCC) excision    right ankle area s/p  excision in office 06/ 2019  in office   History of DVT of lower extremity yrs ago before 2012   History of MI (myocardial infarction) 1995  in [place]   History of pulmonary embolus (PE) 2012   History of ventricular tachycardia    Hyperlipidemia    Hypersomnia    Hypertension    Ischemic cardiomyopathy    Migraines    Myocardial infarction (HCC)    OA (osteoarthritis)    "all over"   Obstructive apnea     PAF (paroxysmal atrial fibrillation) (HCC)    Primary localized osteoarthritis of right hip 12/18/2017   Primary localized osteoarthritis of right knee 05/28/2018   S/P coronary artery stent placement 1995   in [facility]   05-21-2018 per pt x2  stents in same coronary artery (unsure BM or DES)   Vitamin D deficiency disease     SURGICAL HISTORY: Past Surgical History:  Procedure Laterality Date   BIOPSY  06/19/2022   Procedure: BIOPSY;  Surgeon: Kathi Der, MD;  Location: [facility];  Service: Gastroenterology;;   CARDIAC DEFIBRILLATOR PLACEMENT  2007   CATARACT EXTRACTION, BILATERAL     COLONOSCOPY  04/14/2013   colonic polyp, status post polypectomy. Mild panocolonic diverticulosis. Small internal hemorrhoids   DILATION AND CURETTAGE OF UTERUS  yrs ago   DIRECT LARYNGOSCOPY Right 10/04/2022   Procedure: DIRECT LARYNGOSCOPY;  Surgeon: Laren Boom, DO;  Location: [facility] OR;  Service: ENT;  Laterality: Right;   ESOPHAGOGASTRODUODENOSCOPY (EGD) WITH PROPOFOL N/A 06/19/2022   Procedure: ESOPHAGOGASTRODUODENOSCOPY (EGD) WITH  PROPOFOL;  Surgeon: Kathi Der, MD;  Location: [facility];  Service: Gastroenterology;  Laterality: N/A;   IMPACTION REMOVAL  06/19/2022   Procedure: IMPACTION REMOVAL;  Surgeon: Kathi Der, MD;  Location: [facility];  Service: Gastroenterology;;   IMPLANTABLE CARDIOVERTER DEFIBRILLATOR GENERATOR CHANGE N/A 02/04/2013   Procedure: IMPLANTABLE CARDIOVERTER DEFIBRILLATOR GENERATOR CHANGE;  Surgeon: Marinus Maw, MD;  Location: [facility] CATH LAB;  Service: Cardiovascular;  Laterality: N/A;   IR IMAGING GUIDED PORT INSERTION  10/27/2022   LYMPH NODE BIOPSY Right 10/04/2022   Procedure: EXCISIONAL OF RIGHT DEEP CERVICAL LYMPH NODE;  Surgeon: Laren Boom, DO;  Location: [facility] OR;  Service: ENT;  Laterality: Right;   SHOULDER ARTHROSCOPY Right 2015   TOTAL HIP ARTHROPLASTY Right 12/18/2017   Procedure: RIGHT TOTAL HIP ARTHROPLASTY;  Surgeon: Teryl Lucy, MD;  Location: [facility] OR;  Service: Orthopedics;  Laterality: Right;   TOTAL HIP ARTHROPLASTY Left 04/19/2021   Procedure: TOTAL HIP ARTHROPLASTY;  Surgeon: Teryl Lucy, MD;  Location: [facility];  Service: Orthopedics;  Laterality: Left;   TOTAL KNEE ARTHROPLASTY Right 05/28/2018   Procedure: TOTAL KNEE ARTHROPLASTY;  Surgeon: Teryl Lucy, MD;  Location: [facility];  Service: Orthopedics;  Laterality: Right;  Adductor Block   TOTAL KNEE ARTHROPLASTY Left 08/12/2021   Procedure: TOTAL KNEE ARTHROPLASTY;  Surgeon: Teryl Lucy, MD;  Location: [facility];  Service: Orthopedics;  Laterality: Left;   TUBAL LIGATION Bilateral yrs ago   WISDOM TOOTH EXTRACTION      SOCIAL HISTORY: Social History   Socioeconomic History   Marital status: Single    Spouse name: Not on file   Number of children: Not on file   Years of education: Not on file   Highest education level: Not on file  Occupational History   Not on file  Tobacco Use   Smoking status: Former    Current packs/day: 0.00    Types: Cigarettes    Start date: 05/21/1966    Quit date: 05/21/1996    Years since quitting: 26.8   Smokeless tobacco: Never  Vaping Use   Vaping status: Never Used  Substance and Sexual Activity   Alcohol use: No   Drug use: Never   Sexual activity: Not on file  Other Topics Concern   Not on file  Social History Narrative   Not on file   Social Determinants of Health   Financial Resource Strain: Low Risk  (05/28/2018)   Overall Financial Resource Strain (CARDIA)    Difficulty of Paying Living Expenses: Not hard at all  Food Insecurity: No Food Insecurity (02/15/2023)   Hunger Vital Sign    Worried About Running Out of Food in the Last Year: Never true    Ran Out of Food in the Last Year: Never true  Transportation Needs: No Transportation Needs (02/15/2023)   PRAPARE - Administrator, Civil Service (Medical): No    Lack of Transportation (Non-Medical): No  Physical Activity: Not on file   Stress: Not on file  Social Connections: Not on file  Intimate Partner Violence: Unknown (02/15/2023)   Humiliation, Afraid, Rape, and Kick questionnaire    Fear of Current or Ex-Partner: No    Emotionally Abused: No    Physically Abused: Not on file    Sexually Abused: No    FAMILY HISTORY: Family History  Problem Relation Age of Onset   Hypertension Mother    Thyroid disease Mother    Alzheimer's disease Mother    Coronary artery disease Father  Pulmonary embolism Father    Congestive Heart Failure Maternal Grandmother    Hypertension Maternal Grandmother    Heart attack Maternal Grandfather    Other Maternal Grandfather        carotid disease   Dementia Paternal Grandmother    Other Paternal Grandfather 28       accident    ALLERGIES:  is allergic to sumatriptan succinate and amitriptyline.  MEDICATIONS:  Current Outpatient Medications  Medication Sig Dispense Refill   acetaminophen (TYLENOL) 650 MG CR tablet Take 1,300 mg by mouth every 8 (eight) hours as needed for pain.     acyclovir (ZOVIRAX) 400 MG tablet TAKE 1 TABLET BY MOUTH TWICE A DAY 180 tablet 2   allopurinol (ZYLOPRIM) 300 MG tablet TAKE 0.5 TABLETS BY MOUTH DAILY. 45 tablet 1   apixaban (ELIQUIS) 5 MG TABS tablet Take 1 tablet (5 mg total) by mouth 2 (two) times daily.     azithromycin (ZITHROMAX) 250 MG tablet 1 tab po daily x 4 more days, zero refills 4 each 0   b complex vitamins capsule Take 1 capsule by mouth daily.     Calcium Carb-Cholecalciferol (CALCIUM 600 + D PO) Take 1 tablet by mouth daily.     carvedilol (COREG) 12.5 MG tablet Take 12.5 mg by mouth 2 (two) times daily with a meal.      Cholecalciferol (VITAMIN D3) 125 MCG (5000 UT) capsule Take 5,000 Units by mouth daily.     cyanocobalamin (VITAMIN B12) 1000 MCG tablet Take 1,000 mcg by mouth daily.     FIBER PO Take 1 capsule by mouth 2 (two) times daily.     Fiber POWD Take 1 Scoop by mouth daily.     gabapentin (NEURONTIN) 300 MG  capsule Take 300 mg by mouth 3 (three) times daily.     loratadine (CLARITIN) 10 MG tablet Take 10 mg by mouth daily.     Omega-3 Fatty Acids (FISH OIL) 1000 MG CAPS Take 1,000 mg by mouth daily.      pantoprazole (PROTONIX) 40 MG tablet Take 1 tablet (40 mg total) by mouth 2 (two) times daily. 180 tablet 3   predniSONE (DELTASONE) 20 MG tablet Take 3 tablets (60 mg total) by mouth daily with breakfast. Take for 5 days now x 1 course and then for 5 days after each cycle of chemotherapy (Patient taking differently: Take 60 mg by mouth daily with breakfast. Takes for 5 days after chemo) 15 tablet 6   ramipril (ALTACE) 2.5 MG tablet Take 2.5 mg by mouth daily.       simvastatin (ZOCOR) 40 MG tablet Take 40 mg by mouth at bedtime.       tiZANidine (ZANAFLEX) 4 MG tablet Take 2 mg by mouth daily as needed for muscle spasms.     traZODone (DESYREL) 50 MG tablet Take 50 mg by mouth at bedtime.      venlafaxine (EFFEXOR) 75 MG tablet Take 75 mg by mouth daily.     vitamin E 400 UNIT capsule Take 400 Units by mouth daily.     zonisamide (ZONEGRAN) 50 MG capsule Take 50-100 mg by mouth See admin instructions. Take 50mg  by mouth in the morning and 100mg  at night.     No current facility-administered medications for this visit.    REVIEW OF SYSTEMS:    10 Point review of Systems was done is negative except as noted above.   PHYSICAL EXAMINATION: TELE-MED VISIT  LABORATORY DATA:  I have reviewed the  data as listed    Latest Ref Rng & Units 03/05/2023    2:30 PM 02/16/2023    2:59 AM 02/15/2023    5:30 AM  CBC  WBC 4.0 - 10.5 K/uL 3.7  2.3  5.1   Hemoglobin 12.0 - 15.0 g/dL 62.1  9.2  9.6   Hematocrit 36.0 - 46.0 % 35.7  28.7  30.7   Platelets 150 - 400 K/uL 104  85  93       Latest Ref Rng & Units 03/05/2023    2:30 PM 02/16/2023    2:59 AM 02/15/2023    5:30 AM  CMP  Glucose 70 - 99 mg/dL 308  89  99   BUN 8 - 23 mg/dL 17  12  16    Creatinine 0.44 - 1.00 mg/dL 6.57  8.46  9.62   Sodium 135 -  145 mmol/L 138  135  139   Potassium 3.5 - 5.1 mmol/L 3.6  3.4  3.4   Chloride 98 - 111 mmol/L 107  100  105   CO2 22 - 32 mmol/L 26  26  26    Calcium 8.9 - 10.3 mg/dL 9.1  7.9  8.8   Total Protein 6.5 - 8.1 g/dL 5.9   5.7   Total Bilirubin 0.3 - 1.2 mg/dL 0.4   0.9   Alkaline Phos 38 - 126 U/L 67   56   AST 15 - 41 U/L 16   14   ALT 0 - 44 U/L 13   18    Lab Results  Component Value Date   LDH 239 (H) 11/15/2022  2 Molecular Pathology 10/21/2022:    Surgical Pathology 10/04/22: A. LYMPH NODE, RIGHT LEVEL 2 DEEP CERVICAL, EXCISION:  -Diffuse large B-cell lymphoma  -See comment COMMENT: The sections show diffuse effacement of the lymph nodal architecture primarily by a population of large lymphoid cells characterized by vesicular chromatin and small nucleoli associated with apoptosis and brisk mitosis.  In some areas, the large atypical lymphoid proliferation extends into the perinodal adipose tissue.  In this background, there are scattered variably sized and somewhat disrupted aggregates of primarily small lymphoid cells characterized by high nuclear cytoplasmic ratio, angulated nuclear contours and small to inconspicuous nucleoli. Flow cytometric analysis was performed [facility] [id number]) and shows a monoclonal, lambda restricted B-cell population expressing CD10.  In addition, immunohistochemical stains for CD3, CD5, CD10, CD20, PAX5, BCL6, Bcl-2, Ki-67, CD30, CD138, CD21, EBV in addition to in situ hybridization for kappa and lambda were performed with appropriate controls.  The large lymphoid cells are positive for CD20, PAX5, CD10, BCL6, Bcl-2, and partially for cytoplasmic lambda.  No significant staining is seen with EBV, CD30, CD138 or cytoplasmic kappa.  Ki67 shows variably increased expression (more than 50% in some areas). CD21 highlights scattered somewhat disrupted follicular dendritic networks. The lymphoid aggregates of primarily small lymphoid cells  previously described show positivity for B-cell markers CD20 and PAX5 in addition to CD10 and Bcl-2.  There is an admixed variable T-cell component in the background as seen with CD3 and CD5 and there is no apparent co-expression of CD5 in B-cell areas.  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   RADIOGRAPHIC STUDIES: I have personally reviewed the radiological images as listed and agreed with the findings in the report. NM PET Image Restag (PS) Skull Base To Thigh  Result Date:  03/20/2023 CLINICAL DATA:  Subsequent treatment strategy for diffuse large B-cell lymphoma status post 6 cycles chemoimmunotherapy. EXAM: NUCLEAR MEDICINE PET SKULL BASE TO THIGH TECHNIQUE: 7.5 mCi F-18 FDG was injected intravenously. Full-ring PET imaging was performed from the skull base to thigh after the radiotracer. CT data was obtained and used for attenuation correction and anatomic localization. Fasting blood glucose: 113 mg/dl COMPARISON:  16/04/9603 PET-CT FINDINGS: Mediastinal blood pool activity: SUV max 2.5 Liver activity: SUV max 3.7 NECK: No recurrent enlarged or hypermetabolic lymph nodes in the neck. Incidental CT findings: Right internal jugular Port-A-Cath terminates at the cavoatrial junction. CHEST: No enlarged or hypermetabolic axillary, mediastinal or hilar lymph nodes. Hypermetabolic peribronchovascular solid pulmonary nodules in the central right upper lobe and peripheral left upper lobe, increased/new. Representative 1.1 cm central right upper lobe nodule with max SUV 4.6 (series 7/image 28), previously 0.8 cm with max SUV 1.9. Representative 1.0 cm peripheral left upper lobe nodule with max SUV 5.8 (series 7/image 22), new. Incidental CT findings: Stable 2 lead left subclavian ICD with lead tips in the right atrium and right ventricular apex. Coronary  atherosclerosis. Atherosclerotic nonaneurysmal thoracic aorta. ABDOMEN/PELVIS: No abnormal hypermetabolic activity within the liver, pancreas, adrenal glands, or spleen. No hypermetabolic lymph nodes in the abdomen or pelvis. Incidental CT findings: Normal size spleen. Atherosclerotic nonaneurysmal abdominal aorta. SKELETON: New focus of mild hypermetabolism at the anterior right second costochondral junction with apparent slight cortical step-off suggesting a fracture in this location, without discrete osseous lesion (series 4/image 56). No additional foci of skeletal hypermetabolism. Incidental CT findings: Bilateral total hip arthroplasty. Incompletely healed late subacute/early chronic right inferior pubic ramus fracture. Increased somewhat linear sclerosis in the right sacral ala compatible with chronic insufficiency fracture. IMPRESSION: 1. Hypermetabolic peribronchovascular solid pulmonary nodules in the central right upper lobe and peripheral left upper lobe, increased/new since 12/25/2022 PET-CT, suspicious for recurrent lymphoma. Deauville score 5. 2. No recurrent hypermetabolic lymphadenopathy in the neck, abdomen or pelvis. 3. New focus of mild hypermetabolism at the anterior right second costochondral junction with apparent slight cortical step-off suggesting a fracture in this location, without discrete osseous lesion. Correlate for history of trauma. 4. Late subacute/early chronic right inferior pubic ramus fracture, incompletely healed. Chronic evolving right sacral insufficiency fracture. 5.  Aortic Atherosclerosis (ICD10-I70.0). Electronically Signed   By: Delbert Phenix M.D.   On: 03/20/2023 09:50   CUP PACEART INCLINIC DEVICE CHECK  Result Date: 02/27/2023 ICD check in clinic. Normal device function. Thresholds and sensing consistent with previous device measurements. Impedance trends stable over time. AT/AF burden <.1%, 2 short NSVT.  Histogram distribution appropriate for patient and level of  activity. Estimated longevity <1 month to ERI. Patient has not received any therapy since initial implant in 2007, also there is improved EF.  She does not require pacing support.  Dr. Ladona Ridgel reviewed with patient that she does not meet criteria to need device and given that she is coming up on replacement time, would consider turning off defib and pacer support in near future and just allow battery to run out.  (see Dr. Lubertha Basque encounter note for full details of discussion).  Patient did agree to a stepped down approach with adjusting alerts,  turning down pacing, and adjusting shock window and re-evaluate with Dr. Ladona Ridgel in 6months.  We also will discontinue remote monitoring.  Expectation is that the battery will just run out in the interim before next follow up.  Patient verbalizes understanding.  PROGRAMMING CHANGES MADE TODAY: 1.  VF  Detection Interval changed from 320 ms (188 bpm) to 250 ms (240 bpm) 2.  Changed to VVI 30 3.  VT Detection turned Off 4. Mode AAIR/DDDR to VVI and Lower Rate 60 bpm 30 bpm 5. Rate Hysteresis Off 6.  Low Battery Voltage Alert TURNED Off 7.  Excessive Charge Time Alert TURNED Off 8.  Low Battery Voltage Alert TURNED Off Turned off the following: 1 = Patient Alert (Tone) 2 = Clinic Alert ([facility]  SEE ATTACHMENT FOR FULL INTERROGATIN DETAILS. MW,RN.Syliva Overman, RN   NM PET 09/04/2022: IMPRESSION: 1. There is a large tracer avid mass centered within the left base of tongue and left lingual region which extends into the hypopharyngeal region ventral to the epiglottis. Imaging findings are compatible with a primary head and neck malignancy. 2. A smaller focus of increased uptake localizes to the right pharyngeal tonsil region. Indeterminate. 3. Bilateral tracer avid cervical and supraclavicular lymph nodes compatible with nodal metastasis. 4. There is a 7 mm tracer avid nodule within the anteromedial left upper lobe which is suspicious for pulmonary metastasis. 5.  There is increased uptake identified along the long axis of the posterior right seventh rib. No corresponding lytic or sclerotic changes identified on the CT images. Cannot exclude bone metastases. 6. Multiple prominent retroperitoneal and mesenteric lymph nodes are identified which exhibit mild tracer uptake. This is a nonspecific finding and may reflect reactive adenopathy. Metastatic adenopathy cannot be excluded. Attention on future surveillance imaging is advised. 7.  Aortic Atherosclerosis (ICD10-I70.0).   ASSESSMENT & PLAN:   77 y.o.  female with:   1. 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 2. 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 Showed Hypermetabolic peribronchovascular solid pulmonary nodules in the central right upper lobe and peripheral left upper lobe, increased/new since 12/25/2022 PET-CT, suspicious for recurrent lymphoma. Deauville score 5. New focus of mild hypermetabolism at the anterior right second costochondral junction with apparent slight cortical step-off suggesting a fracture in this location, without discrete osseous lesion. Correlate for history of trauma. -Discussed the option of getting referred to Pulmonologist regarding the nodule in the right central upper lobe and possibility of biopsy. Or discussed the option of getting an CT scan and observe. Patient agrees to getting a CT scan and getting referred to Pulmonologist.  -Schedule patient for CT scan in 5 weeks.   -RTC with Dr. Candise Che in 6 weeks.  -Refer the patient to a Pulmonologist.   FOLLOW-UP: -Referral to pulmonology Dr. Tonia Brooms for evaluation of residual pulmonary  lesions?  Reactive versus residual lymphoma -consideration of possible bronchoscopic biopsy. -CT chest w/o contrast in 5 weeks -Return to clinic with Dr. Candise Che with port flush and labs in 6 weeks   The total time spent in the appointment was 21 minutes* .  All of the patient's questions were answered with apparent satisfaction. The patient knows to call the clinic with any problems, questions or concerns.   Wyvonnia Lora MD MS AAHIVMS [facility] Hematology/Oncology Physician [facility]  .*Total Encounter Time as defined by the Centers for Medicare and Medicaid Services includes, in addition to the face-to-face time of a patient visit (documented in the note above) non-face-to-face time: obtaining and reviewing outside history, ordering  and reviewing medications, tests or procedures, care coordination (communications with other health care professionals or caregivers) and documentation in the medical record.   I,Param Shah,acting as a Neurosurgeon for Wyvonnia Lora, MD.,have documented all relevant documentation on the behalf of Wyvonnia Lora, MD,as directed by  Wyvonnia Lora, MD while in the presence of Wyvonnia Lora, MD.

## 2023-03-28 ENCOUNTER — Other Ambulatory Visit: Payer: Self-pay | Admitting: Internal Medicine

## 2023-03-28 DIAGNOSIS — I480 Paroxysmal atrial fibrillation: Secondary | ICD-10-CM

## 2023-03-29 ENCOUNTER — Telehealth: Payer: Self-pay | Admitting: Hematology

## 2023-03-29 NOTE — Telephone Encounter (Signed)
Eliquis 5mg  refill request received. Patient is 77 years old, weight-65.1kg, Crea-0.68 on 03/05/23, Diagnosis-Afib, and last seen by Dr. Ladona Ridgel on 02/27/23. Dose is appropriate based on dosing criteria. Will send in refill to requested pharmacy.

## 2023-03-29 NOTE — Telephone Encounter (Signed)
Patient is aware of scheduled appointment times/dates

## 2023-04-01 ENCOUNTER — Encounter: Payer: Self-pay | Admitting: Hematology

## 2023-04-27 ENCOUNTER — Institutional Professional Consult (permissible substitution): Payer: Medicare HMO | Admitting: Pulmonary Disease

## 2023-04-30 ENCOUNTER — Ambulatory Visit (HOSPITAL_COMMUNITY)
Admission: RE | Admit: 2023-04-30 | Discharge: 2023-04-30 | Disposition: A | Discharge status: Home / Self Care | Payer: Medicare HMO | Source: Ambulatory Visit | Attending: Hematology | Admitting: Hematology

## 2023-04-30 DIAGNOSIS — C8511 Unspecified B-cell lymphoma, lymph nodes of head, face, and neck: Secondary | ICD-10-CM | POA: Diagnosis not present

## 2023-04-30 DIAGNOSIS — R911 Solitary pulmonary nodule: Secondary | ICD-10-CM | POA: Insufficient documentation

## 2023-04-30 DIAGNOSIS — R918 Other nonspecific abnormal finding of lung field: Secondary | ICD-10-CM | POA: Diagnosis not present

## 2023-04-30 DIAGNOSIS — I700 Atherosclerosis of aorta: Secondary | ICD-10-CM | POA: Diagnosis not present

## 2023-05-07 ENCOUNTER — Inpatient Hospital Stay (HOSPITAL_BASED_OUTPATIENT_CLINIC_OR_DEPARTMENT_OTHER): Payer: Medicare HMO | Admitting: Hematology

## 2023-05-07 ENCOUNTER — Inpatient Hospital Stay: Payer: Medicare HMO | Attending: Hematology

## 2023-05-07 VITALS — BP 118/58 | HR 77 | Temp 97.7°F | Resp 18 | Wt 154.9 lb

## 2023-05-07 DIAGNOSIS — C8331 Diffuse large B-cell lymphoma, lymph nodes of head, face, and neck: Secondary | ICD-10-CM | POA: Diagnosis not present

## 2023-05-07 DIAGNOSIS — Z79899 Other long term (current) drug therapy: Secondary | ICD-10-CM | POA: Diagnosis not present

## 2023-05-07 DIAGNOSIS — R911 Solitary pulmonary nodule: Secondary | ICD-10-CM

## 2023-05-07 DIAGNOSIS — C8338 Diffuse large B-cell lymphoma, lymph nodes of multiple sites: Secondary | ICD-10-CM

## 2023-05-07 LAB — CBC WITH DIFFERENTIAL (CANCER CENTER ONLY)
Abs Immature Granulocytes: 0.01 10*3/uL (ref 0.00–0.07)
Basophils Absolute: 0 10*3/uL (ref 0.0–0.1)
Basophils Relative: 0 %
Eosinophils Absolute: 0.1 10*3/uL (ref 0.0–0.5)
Eosinophils Relative: 2 %
HCT: 38.8 % (ref 36.0–46.0)
Hemoglobin: 13.1 g/dL (ref 12.0–15.0)
Immature Granulocytes: 0 %
Lymphocytes Relative: 19 %
Lymphs Abs: 0.6 10*3/uL — ABNORMAL LOW (ref 0.7–4.0)
MCH: 29.8 pg (ref 26.0–34.0)
MCHC: 33.8 g/dL (ref 30.0–36.0)
MCV: 88.2 fL (ref 80.0–100.0)
Monocytes Absolute: 0.4 10*3/uL (ref 0.1–1.0)
Monocytes Relative: 13 %
Neutro Abs: 2.1 10*3/uL (ref 1.7–7.7)
Neutrophils Relative %: 66 %
Platelet Count: 115 10*3/uL — ABNORMAL LOW (ref 150–400)
RBC: 4.4 MIL/uL (ref 3.87–5.11)
RDW: 13.4 % (ref 11.5–15.5)
WBC Count: 3.2 10*3/uL — ABNORMAL LOW (ref 4.0–10.5)
nRBC: 0 % (ref 0.0–0.2)

## 2023-05-07 LAB — CMP (CANCER CENTER ONLY)
ALT: 17 U/L (ref 0–44)
AST: 20 U/L (ref 15–41)
Albumin: 4.2 g/dL (ref 3.5–5.0)
Alkaline Phosphatase: 88 U/L (ref 38–126)
Anion gap: 6 (ref 5–15)
BUN: 20 mg/dL (ref 8–23)
CO2: 26 mmol/L (ref 22–32)
Calcium: 9.3 mg/dL (ref 8.9–10.3)
Chloride: 107 mmol/L (ref 98–111)
Creatinine: 0.71 mg/dL (ref 0.44–1.00)
GFR, Estimated: >60 mL/min (ref >60)
Glucose, Bld: 91 mg/dL (ref 70–99)
Potassium: 3.8 mmol/L (ref 3.5–5.1)
Sodium: 139 mmol/L (ref 135–145)
Total Bilirubin: 0.5 mg/dL (ref 0.3–1.2)
Total Protein: 6.6 g/dL (ref 6.5–8.1)

## 2023-05-07 LAB — LACTATE DEHYDROGENASE: LDH: 161 U/L (ref 98–192)

## 2023-05-07 NOTE — Progress Notes (Signed)
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 female who has been referred to [place] by Carylon Perches, DO for evaluation and management of non hodgkin's lymphoma. She has a hx of chronic systolic heart failure.  Nasolaryngoscopy revealed a mass lesion of the left oropharynx, emanating from the left base of tongue/glossotonsillar sulcus. Patient subsequently had a CT neck with contrast performed on 08/09/2022, which reported extensive bilateral adenopaty, with greatest lymph node conglomerate measuring 3.2 x 2.4 cm in the right level 2. Patient underwent core needle biopsy on 08/18/22 and endorsed persistent throat pain and odynophagia at the time. She is a former smoker and quit in the late 90s. SHe endorsed heavy tobacco use prior to quitting, with 1.5 pack/day for about 30 years.   Today, she is accompanied by her daughter. She complains of a sore throat beginning around Thanksgiving time. She was given antibiotics and prednisone which did not improve symptoms. She endorses swelling in her neck with associated soreness which came quickly past 2-3 weeks. She reports that her neck edema has recently grown on both the left and right sides. Her neck pain is in a specific area. She also complains of recent her neck pain if she turns her neck.  She did present Emergency EMT because something was stuck in her throat. Endoscopy revealed that it was food and it was pushed down. An ulcer was also found in her esophagus and some narrowing which will be stretched in three  months. She has not had any issues with swallowing food since then. She has not had a biopsy of issue on the back of her tongue. No other lumps/bumps, unexplained fevers, chills, night sweat, change in breathing, abdominal pain, or skin rashes. She reports discomfort with drinking tea which caused her to eat less as well.   She is allergic to Sumatriptan and another medication she cannot recall. She does not consume alcohol and has quit smoking in the 90s. 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 She believes the sore was caused by shoe discomfort.  She denies any facial puffiness, abdominal pain, recent change in bowel habits or urination. She reports that her defibrillator has never gone off.   She reports a ganglion cyst on her right knee, 2 knee replacements, and 2 hip replacements. She denies a Fhx of blood disorders. However, her sister has lung cancer and was a frequent smoker.  She is UTD with her vaccinations, incuding influenza, RSV, and COVID-19 booster. She has endorsed migraines since childhood. She lives on her own is able to complete daily activities independently. She will follow-up with Dr. Marene Lenz soon. She follows up with cardiologist regularly. Her a fib, pacemaker, and ICD device have been stable. She has not had a recent echo.   INTERVAL HISTORY:  Rachel Bates is a 77 y.o. female who presents today for follow up and management of diffuse large B-cell lymphoma.  I last connected with the patient via tele-med visit on 03/26/2023 and she was doing well  overall.   Patient is accompanied by her friend during this visit. Patient notes she has been doing well overall since our last visit. She notes she had cold symptoms since our last visit, she did not test for COVID-19 or influenza. Patient notes that her symptoms included congestion and chills. She has recovered well from her cold.    She denies night sweats, fever, back pain, chest pain, SOB, new lumps/bumps, or leg swelling. She denies any recent falls.   Patient notes that her back pain and her energy have improved since our last visit.    MEDICAL HISTORY:  Past Medical History:  Diagnosis Date   AICD (automatic cardioverter/defibrillator) present dual   [employer]tr[employer] ---  original placedment 2007/  generator change 2014 by dr gregg taylor   Anemia    Anticoagulant long-term use    eliquis   Anxiety    Arthralgia of multiple joints    Arthritis pain    Benign hypertensive heart disease    CAD (coronary artery disease) primary cardiologist-- dr gregg taylor   MI and 2 stents 1995 in [place]   Cancer [facility])    CHF NYHA class II, chronic, systolic (HCC)    followed by dr Sharlot Gowda taylor   Degenerative disc disease, lumbar    Depression    Dyslipidemia    History of basal cell carcinoma (BCC) excision    right ankle area s/p  excision in office 06/ 2019  in office   History of DVT of lower extremity yrs ago before 2012   History of MI (myocardial infarction) 1995  in [place]   History of pulmonary embolus (PE) 2012   History of ventricular tachycardia    Hyperlipidemia    Hypersomnia    Hypertension    Ischemic cardiomyopathy    Migraines    Myocardial infarction (HCC)    OA (osteoarthritis)    "all over"   Obstructive apnea    PAF (paroxysmal atrial fibrillation) (HCC)    Primary localized osteoarthritis of right hip 12/18/2017   Primary localized osteoarthritis of right knee 05/28/2018   S/P coronary artery stent placement 1995   in [facility]   05-21-2018 per pt x2  stents in same coronary artery (unsure BM or DES)   Vitamin D deficiency disease     SURGICAL HISTORY: Past Surgical History:  Procedure Laterality Date   BIOPSY  06/19/2022   Procedure: BIOPSY;  Surgeon: Kathi Der, MD;  Location: [facility];  Service: Gastroenterology;;   CARDIAC DEFIBRILLATOR PLACEMENT  2007   CATARACT EXTRACTION,  BILATERAL     COLONOSCOPY  04/14/2013   colonic polyp, status post polypectomy. Mild panocolonic diverticulosis. Small internal hemorrhoids   DILATION AND CURETTAGE OF UTERUS  yrs ago   DIRECT LARYNGOSCOPY Right 10/04/2022   Procedure: DIRECT LARYNGOSCOPY;  Surgeon: Cheron Schaumann A, DO;  Location: [facility] OR;  Service: ENT;  Laterality: Right;   ESOPHAGOGASTRODUODENOSCOPY (EGD) WITH PROPOFOL N/A 06/19/2022   Procedure: ESOPHAGOGASTRODUODENOSCOPY (EGD) WITH PROPOFOL;  Surgeon: Kathi Der, MD;  Location: [facility];  Service: Gastroenterology;  Laterality: N/A;   IMPACTION REMOVAL  06/19/2022   Procedure: IMPACTION REMOVAL;  Surgeon: Kathi Der, MD;  Location: [facility];  Service: Gastroenterology;;   IMPLANTABLE CARDIOVERTER DEFIBRILLATOR GENERATOR CHANGE N/A 02/04/2013   Procedure: IMPLANTABLE CARDIOVERTER DEFIBRILLATOR GENERATOR CHANGE;  Surgeon: Marinus Maw, MD;  Location: [facility] CATH LAB;  Service: Cardiovascular;  Laterality: N/A;   IR IMAGING GUIDED PORT INSERTION  10/27/2022   LYMPH NODE BIOPSY Right 10/04/2022  Procedure: EXCISIONAL OF RIGHT DEEP CERVICAL LYMPH NODE;  Surgeon: Laren Boom, DO;  Location: [facility];  Service: ENT;  Laterality: Right;   SHOULDER ARTHROSCOPY Right 2015   TOTAL HIP ARTHROPLASTY Right 12/18/2017   Procedure: RIGHT TOTAL HIP ARTHROPLASTY;  Surgeon: Teryl Lucy, MD;  Location: [facility];  Service: Orthopedics;  Laterality: Right;   TOTAL HIP ARTHROPLASTY Left 04/19/2021   Procedure: TOTAL HIP ARTHROPLASTY;  Surgeon: Teryl Lucy, MD;  Location: [facility];  Service: Orthopedics;  Laterality: Left;   TOTAL KNEE ARTHROPLASTY Right 05/28/2018   Procedure: TOTAL KNEE ARTHROPLASTY;  Surgeon: Teryl Lucy, MD;  Location: [facility];  Service: Orthopedics;  Laterality: Right;  Adductor Block   TOTAL KNEE ARTHROPLASTY Left 08/12/2021   Procedure: TOTAL KNEE ARTHROPLASTY;  Surgeon: Teryl Lucy, MD;  Location: [facility];  Service: Orthopedics;  Laterality:  Left;   TUBAL LIGATION Bilateral yrs ago   WISDOM TOOTH EXTRACTION      SOCIAL HISTORY: Social History   Socioeconomic History   Marital status: Single    Spouse name: Not on file   Number of children: Not on file   Years of education: Not on file   Highest education level: Not on file  Occupational History   Not on file  Tobacco Use   Smoking status: Former    Current packs/day: 0.00    Types: Cigarettes    Start date: 05/21/1966    Quit date: 05/21/1996    Years since quitting: 26.9   Smokeless tobacco: Never  Vaping Use   Vaping status: Never Used  Substance and Sexual Activity   Alcohol use: No   Drug use: Never   Sexual activity: Not on file  Other Topics Concern   Not on file  Social History Narrative   Not on file   Social Determinants of Health   Financial Resource Strain: Low Risk  (05/28/2018)   Overall Financial Resource Strain (CARDIA)    Difficulty of Paying Living Expenses: Not hard at all  Food Insecurity: No Food Insecurity (02/15/2023)   Hunger Vital Sign    Worried About Running Out of Food in the Last Year: Never true    Ran Out of Food in the Last Year: Never true  Transportation Needs: No Transportation Needs (02/15/2023)   PRAPARE - Administrator, Civil Service (Medical): No    Lack of Transportation (Non-Medical): No  Physical Activity: Not on file  Stress: Not on file  Social Connections: Not on file  Intimate Partner Violence: Unknown (02/15/2023)   Humiliation, Afraid, Rape, and Kick questionnaire    Fear of Current or Ex-Partner: No    Emotionally Abused: No    Physically Abused: Not on file    Sexually Abused: No    FAMILY HISTORY: Family History  Problem Relation Age of Onset   Hypertension Mother    Thyroid disease Mother    Alzheimer's disease Mother    Coronary artery disease Father    Pulmonary embolism Father    Congestive Heart Failure Maternal Grandmother    Hypertension Maternal Grandmother    Heart attack  Maternal Grandfather    Other Maternal Grandfather        carotid disease   Dementia Paternal Grandmother    Other Paternal Grandfather 72       accident    ALLERGIES:  is allergic to sumatriptan succinate and amitriptyline.  MEDICATIONS:  Current Outpatient Medications  Medication Sig Dispense Refill   acetaminophen (TYLENOL) 650 MG CR tablet  Take 1,300 mg by mouth every 8 (eight) hours as needed for pain.     acyclovir (ZOVIRAX) 400 MG tablet TAKE 1 TABLET BY MOUTH TWICE A DAY 180 tablet 2   allopurinol (ZYLOPRIM) 300 MG tablet TAKE 0.5 TABLETS BY MOUTH DAILY. 45 tablet 1   azithromycin (ZITHROMAX) 250 MG tablet 1 tab po daily x 4 more days, zero refills 4 each 0   b complex vitamins capsule Take 1 capsule by mouth daily.     Calcium Carb-Cholecalciferol (CALCIUM 600 + D PO) Take 1 tablet by mouth daily.     carvedilol (COREG) 12.5 MG tablet Take 12.5 mg by mouth 2 (two) times daily with a meal.      Cholecalciferol (VITAMIN D3) 125 MCG (5000 UT) capsule Take 5,000 Units by mouth daily.     cyanocobalamin (VITAMIN B12) 1000 MCG tablet Take 1,000 mcg by mouth daily.     ELIQUIS 5 MG TABS tablet TAKE 1 TABLET BY MOUTH TWICE A DAY 60 tablet 5   FIBER PO Take 1 capsule by mouth 2 (two) times daily.     Fiber POWD Take 1 Scoop by mouth daily.     gabapentin (NEURONTIN) 300 MG capsule Take 300 mg by mouth 3 (three) times daily.     loratadine (CLARITIN) 10 MG tablet Take 10 mg by mouth daily.     Omega-3 Fatty Acids (FISH OIL) 1000 MG CAPS Take 1,000 mg by mouth daily.      pantoprazole (PROTONIX) 40 MG tablet Take 1 tablet (40 mg total) by mouth 2 (two) times daily. 180 tablet 3   predniSONE (DELTASONE) 20 MG tablet Take 3 tablets (60 mg total) by mouth daily with breakfast. Take for 5 days now x 1 course and then for 5 days after each cycle of chemotherapy (Patient taking differently: Take 60 mg by mouth daily with breakfast. Takes for 5 days after chemo) 15 tablet 6   ramipril (ALTACE)  2.5 MG tablet Take 2.5 mg by mouth daily.       simvastatin (ZOCOR) 40 MG tablet Take 40 mg by mouth at bedtime.       tiZANidine (ZANAFLEX) 4 MG tablet Take 2 mg by mouth daily as needed for muscle spasms.     traZODone (DESYREL) 50 MG tablet Take 50 mg by mouth at bedtime.      venlafaxine (EFFEXOR) 75 MG tablet Take 75 mg by mouth daily.     vitamin E 400 UNIT capsule Take 400 Units by mouth daily.     zonisamide (ZONEGRAN) 50 MG capsule Take 50-100 mg by mouth See admin instructions. Take 50mg  by mouth in the morning and 100mg  at night.     No current facility-administered medications for this visit.    REVIEW OF SYSTEMS:    10 Point review of Systems was done is negative except as noted above.   PHYSICAL EXAMINATION: TELE-MED VISIT  LABORATORY DATA:  I have reviewed the data as listed    Latest Ref Rng & Units 03/05/2023    2:30 PM 02/16/2023    2:59 AM 02/15/2023    5:30 AM  CBC  WBC 4.0 - 10.5 K/uL 3.7  2.3  5.1   Hemoglobin 12.0 - 15.0 g/dL 33.2  9.2  9.6   Hematocrit 36.0 - 46.0 % 35.7  28.7  30.7   Platelets 150 - 400 K/uL 104  85  93       Latest Ref Rng & Units 03/05/2023  2:30 PM 02/16/2023    2:59 AM 02/15/2023    5:30 AM  CMP  Glucose 70 - 99 mg/dL 875  89  99   BUN 8 - 23 mg/dL 17  12  16    Creatinine 0.44 - 1.00 mg/dL 6.43  3.29  5.18   Sodium 135 - 145 mmol/L 138  135  139   Potassium 3.5 - 5.1 mmol/L 3.6  3.4  3.4   Chloride 98 - 111 mmol/L 107  100  105   CO2 22 - 32 mmol/L 26  26  26    Calcium 8.9 - 10.3 mg/dL 9.1  7.9  8.8   Total Protein 6.5 - 8.1 g/dL 5.9   5.7   Total Bilirubin 0.3 - 1.2 mg/dL 0.4   0.9   Alkaline Phos 38 - 126 U/L 67   56   AST 15 - 41 U/L 16   14   ALT 0 - 44 U/L 13   18    Lab Results  Component Value Date   LDH 239 (H) 11/15/2022   Molecular Pathology 10/21/2022:    Surgical Pathology 10/04/22: A. LYMPH NODE, RIGHT LEVEL 2 DEEP CERVICAL, EXCISION:  -Diffuse large B-cell lymphoma  -See comment COMMENT: The sections show  diffuse effacement of the lymph nodal architecture primarily by a population of large lymphoid cells characterized by vesicular chromatin and small nucleoli associated with apoptosis and brisk mitosis.  In some areas, the large atypical lymphoid proliferation extends into the perinodal adipose tissue.  In this background, there are scattered variably sized and somewhat disrupted aggregates of primarily small lymphoid cells characterized by high nuclear cytoplasmic ratio, angulated nuclear contours and small to inconspicuous nucleoli. Flow cytometric analysis was performed [facility] [id number]) and shows a monoclonal, lambda restricted B-cell population expressing CD10.  In addition, immunohistochemical stains for CD3, CD5, CD10, CD20, PAX5, BCL6, Bcl-2, Ki-67, CD30, CD138, CD21, EBV in addition to in situ hybridization for kappa and lambda were performed with appropriate controls.  The large lymphoid cells are positive for CD20, PAX5, CD10, BCL6, Bcl-2, and partially for cytoplasmic lambda.  No significant staining is seen with EBV, CD30, CD138 or cytoplasmic kappa.  Ki67 shows variably increased expression (more than 50% in some areas). CD21 highlights scattered somewhat disrupted follicular dendritic networks. The lymphoid aggregates of primarily small lymphoid cells previously described show positivity for B-cell markers CD20 and PAX5 in addition to CD10 and Bcl-2.  There is an admixed variable T-cell component in the background as seen with CD3 and CD5 and there is no apparent co-expression of CD5 in B-cell areas.  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   RADIOGRAPHIC STUDIES: I have personally reviewed the radiological images as listed and agreed with the findings in the report. No results found.  NM PET 09/04/2022: IMPRESSION: 1. There is a large  tracer avid mass centered within the left base of tongue and left lingual region which extends into the hypopharyngeal region ventral to the epiglottis. Imaging findings are compatible with a primary head and neck malignancy. 2. A smaller focus of increased uptake localizes to the right pharyngeal tonsil region. Indeterminate. 3. Bilateral tracer avid cervical and supraclavicular lymph nodes compatible with nodal metastasis. 4. There is a 7 mm tracer avid nodule within the anteromedial left upper lobe which is suspicious for pulmonary metastasis. 5. There is increased uptake identified along the  long axis of the posterior right seventh rib. No corresponding lytic or sclerotic changes identified on the CT images. Cannot exclude bone metastases. 6. Multiple prominent retroperitoneal and mesenteric lymph nodes are identified which exhibit mild tracer uptake. This is a nonspecific finding and may reflect reactive adenopathy. Metastatic adenopathy cannot be excluded. Attention on future surveillance imaging is advised. 7.  Aortic Atherosclerosis (ICD10-I70.0).   ASSESSMENT & PLAN:   77 y.o.  female with:   1. 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 2. 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 CBC shows low WBC at 3.2 K and low platelets at 115 K. CMP is stable. -Discussed CT chest scan results from 04/30/2023 with the patient via phone. Showed The previously demonstrated right upper lobe perivascular nodule appears slightly larger, with an enlarging adjacent nodule laterally. These nodules were hypermetabolic on PET-CT and remain suspicious for  neoplasm, possibly recurrent lymphoma or primary lung cancer.  Additional clustered nodularity peripherally in the left upper lobe is unchanged and favored to be inflammatory. -Answered all of patient's questions.  -Recommend influenza vaccine, RSV vaccine, COVID-19 Booster, and other age related vaccines. -Discussed with the patient that the next step is to get referred to Pulmonologist regarding PET-CT scan result showing new nodule in the lung. Patient agrees.  -Will refer the patient to Dr. Tonia Brooms.    FOLLOW-UP: Pulmonary referral to Dr Tonia Brooms for evaluation of lung nodule in patient with h/o NHL RTC with Dr Candise Che with labs in 3 months  The total time spent in the appointment was 30 minutes* .  All of the patient's questions were answered with apparent satisfaction. The patient knows to call the clinic with any problems, questions or concerns.   Wyvonnia Lora MD MS AAHIVMS [facility] Hematology/Oncology Physician [facility]  .*Total Encounter Time as defined by the Centers for Medicare and Medicaid Services includes, in addition to the face-to-face time of a patient visit (documented in the note above) non-face-to-face time: obtaining and reviewing outside history, ordering and reviewing medications, tests or procedures, care coordination (communications with other health care professionals or caregivers) and documentation in the medical record.   I,Param Shah,acting as a Neurosurgeon for Wyvonnia Lora, MD.,have documented all relevant documentation on the behalf of Wyvonnia Lora, MD,as directed by  Wyvonnia Lora, MD while in the presence of Wyvonnia Lora, MD.

## 2023-05-08 ENCOUNTER — Telehealth: Payer: Self-pay | Admitting: Hematology

## 2023-05-08 NOTE — Telephone Encounter (Signed)
Per Rachel Bates 10/28 LOS Patient is aware of scheduled appointment times/dates

## 2023-05-13 ENCOUNTER — Encounter: Payer: Self-pay | Admitting: Hematology

## 2023-06-03 ENCOUNTER — Other Ambulatory Visit: Payer: Self-pay | Admitting: Hematology

## 2023-06-03 DIAGNOSIS — Z7189 Other specified counseling: Secondary | ICD-10-CM

## 2023-06-03 DIAGNOSIS — C8338 Diffuse large B-cell lymphoma, lymph nodes of multiple sites: Secondary | ICD-10-CM

## 2023-06-04 ENCOUNTER — Encounter: Payer: Self-pay | Admitting: Hematology

## 2023-06-05 ENCOUNTER — Ambulatory Visit: Payer: Medicare Other

## 2023-06-12 DIAGNOSIS — Z961 Presence of intraocular lens: Secondary | ICD-10-CM | POA: Diagnosis not present

## 2023-06-12 DIAGNOSIS — H26493 Other secondary cataract, bilateral: Secondary | ICD-10-CM | POA: Diagnosis not present

## 2023-06-12 DIAGNOSIS — H532 Diplopia: Secondary | ICD-10-CM | POA: Diagnosis not present

## 2023-06-12 DIAGNOSIS — H02839 Dermatochalasis of unspecified eye, unspecified eyelid: Secondary | ICD-10-CM | POA: Diagnosis not present

## 2023-06-12 DIAGNOSIS — H524 Presbyopia: Secondary | ICD-10-CM | POA: Diagnosis not present

## 2023-06-12 DIAGNOSIS — H43813 Vitreous degeneration, bilateral: Secondary | ICD-10-CM | POA: Diagnosis not present

## 2023-07-09 DIAGNOSIS — H524 Presbyopia: Secondary | ICD-10-CM | POA: Diagnosis not present

## 2023-07-25 ENCOUNTER — Encounter: Payer: Self-pay | Admitting: Emergency Medicine

## 2023-07-25 ENCOUNTER — Ambulatory Visit: Payer: Medicare HMO | Admitting: Pulmonary Disease

## 2023-07-25 ENCOUNTER — Encounter: Payer: Self-pay | Admitting: Pulmonary Disease

## 2023-07-25 VITALS — BP 133/81 | HR 88 | Ht 67.0 in | Wt 154.6 lb

## 2023-07-25 DIAGNOSIS — R911 Solitary pulmonary nodule: Secondary | ICD-10-CM | POA: Diagnosis not present

## 2023-07-25 DIAGNOSIS — R918 Other nonspecific abnormal finding of lung field: Secondary | ICD-10-CM | POA: Insufficient documentation

## 2023-07-25 NOTE — H&P (View-Only) (Signed)
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  Patient has a new right upper lobe pulmonary nodule hypermetabolic on previous pet imaging was referred here for consideration of bronchoscopy and biopsy.  We talked about the lesion today and reviewed images in the office.      Past Medical History:  Diagnosis Date   AICD (automatic cardioverter/defibrillator) present dual   [employer] ---  original placedment 2007/  generator change 2014 by dr gregg taylor   Anemia    Anticoagulant long-term use    eliquis   Anxiety    Arthralgia of multiple joints    Arthritis pain    Benign hypertensive heart disease    CAD (coronary artery disease) primary cardiologist-- dr gregg taylor   MI and 2 stents 1995 in [place]   Cancer [facility])    CHF NYHA class II, chronic, systolic (HCC)    followed by dr Sharlot Gowda taylor   Degenerative disc disease, lumbar    Depression    Dyslipidemia    History of basal cell carcinoma (BCC) excision    right ankle area s/p  excision in office 06/ 2019  in office   History of DVT of lower extremity yrs ago before 2012   History of MI (myocardial infarction) 1995  in [place]   History of pulmonary embolus (PE) 2012   History of ventricular tachycardia    Hyperlipidemia    Hypersomnia    Hypertension    Ischemic cardiomyopathy    Migraines    Myocardial infarction (HCC)    OA (osteoarthritis)    "all over"   Obstructive apnea    PAF (paroxysmal atrial fibrillation) (HCC)    Primary localized osteoarthritis of right hip 12/18/2017   Primary localized osteoarthritis of right knee  05/28/2018   S/P coronary artery stent placement 1995   in [facility]   05-21-2018 per pt x2  stents in same coronary artery (unsure BM or DES)   Vitamin D deficiency disease      Family History  Problem Relation Age of Onset   Hypertension Mother    Thyroid disease Mother    Alzheimer's disease Mother    Coronary artery disease Father    Pulmonary embolism Father    Congestive Heart Failure Maternal Grandmother    Hypertension Maternal Grandmother    Heart attack Maternal Grandfather    Other Maternal Grandfather        carotid disease   Dementia Paternal Grandmother    Other Paternal Grandfather 31       accident     Past Surgical History:  Procedure Laterality Date   BIOPSY  06/19/2022   Procedure: BIOPSY;  Surgeon: Kathi Der, MD;  Location: [facility];  Service: Gastroenterology;;   CARDIAC DEFIBRILLATOR PLACEMENT  2007   CATARACT EXTRACTION, BILATERAL     COLONOSCOPY  04/14/2013   colonic polyp, status post polypectomy. Mild panocolonic diverticulosis. Small internal hemorrhoids   DILATION AND CURETTAGE OF UTERUS  yrs ago   DIRECT LARYNGOSCOPY Right 10/04/2022   Procedure: DIRECT LARYNGOSCOPY;  Surgeon: Laren Boom, DO;  Location: [facility];  Service: ENT;  Laterality: Right;   ESOPHAGOGASTRODUODENOSCOPY (EGD) WITH PROPOFOL N/A 06/19/2022   Procedure: ESOPHAGOGASTRODUODENOSCOPY (EGD) WITH PROPOFOL;  Surgeon: Kathi Der, MD;  Location: [facility];  Service: Gastroenterology;  Laterality: N/A;   IMPACTION REMOVAL  06/19/2022   Procedure: IMPACTION REMOVAL;  Surgeon: Kathi Der, MD;  Location: [facility];  Service: Gastroenterology;;   IMPLANTABLE CARDIOVERTER DEFIBRILLATOR GENERATOR CHANGE N/A 02/04/2013   Procedure: IMPLANTABLE CARDIOVERTER DEFIBRILLATOR GENERATOR CHANGE;  Surgeon: Marinus Maw, MD;  Location: [facility] CATH LAB;  Service: Cardiovascular;  Laterality: N/A;   IR IMAGING GUIDED PORT INSERTION  10/27/2022   LYMPH NODE BIOPSY Right 10/04/2022    Procedure: EXCISIONAL OF RIGHT DEEP CERVICAL LYMPH NODE;  Surgeon: Laren Boom, DO;  Location: [facility];  Service: ENT;  Laterality: Right;   SHOULDER ARTHROSCOPY Right 2015   TOTAL HIP ARTHROPLASTY Right 12/18/2017   Procedure: RIGHT TOTAL HIP ARTHROPLASTY;  Surgeon: Teryl Lucy, MD;  Location: [facility];  Service: Orthopedics;  Laterality: Right;   TOTAL HIP ARTHROPLASTY Left 04/19/2021   Procedure: TOTAL HIP ARTHROPLASTY;  Surgeon: Teryl Lucy, MD;  Location: [facility];  Service: Orthopedics;  Laterality: Left;   TOTAL KNEE ARTHROPLASTY Right 05/28/2018   Procedure: TOTAL KNEE ARTHROPLASTY;  Surgeon: Teryl Lucy, MD;  Location: [facility];  Service: Orthopedics;  Laterality: Right;  Adductor Block   TOTAL KNEE ARTHROPLASTY Left 08/12/2021   Procedure: TOTAL KNEE ARTHROPLASTY;  Surgeon: Teryl Lucy, MD;  Location: [facility];  Service: Orthopedics;  Laterality: Left;   TUBAL LIGATION Bilateral yrs ago   WISDOM TOOTH EXTRACTION      Social History   Socioeconomic History   Marital status: Single    Spouse name: Not on file   Number of children: Not on file   Years of education: Not on file   Highest education level: Not on file  Occupational History   Not on file  Tobacco Use   Smoking status: Former    Current packs/day: 0.00    Types: Cigarettes    Start date: 05/21/1966    Quit date: 05/21/1996    Years since quitting: 27.1   Smokeless tobacco: Never  Vaping Use   Vaping status: Never Used  Substance and Sexual Activity   Alcohol use: No   Drug use: Never   Sexual activity: Not on file  Other Topics Concern   Not on file  Social History Narrative   Not on file   Social Drivers of Health   Financial Resource Strain: Low Risk  (05/28/2018)   Overall Financial Resource Strain (CARDIA)    Difficulty of Paying Living Expenses: Not hard at all  Food Insecurity: No Food Insecurity (02/15/2023)   Hunger Vital Sign    Worried About Running Out of Food in the Last Year:  Never true    Ran Out of Food in the Last Year: Never true  Transportation Needs: No Transportation Needs (02/15/2023)   PRAPARE - Administrator, Civil Service (Medical): No    Lack of Transportation (Non-Medical): No  Physical Activity: Not on file  Stress: Not on file  Social Connections: Not on file  Intimate Partner Violence: Unknown (02/15/2023)   Humiliation, Afraid, Rape, and Kick questionnaire    Fear of Current or Ex-Partner: No    Emotionally Abused: No    Physically Abused: Not on file    Sexually Abused: No     Allergies  Allergen Reactions   Sumatriptan Succinate Other (See Comments)    Chest pain, no triptans, pt  states "it makes my heart race"   Amitriptyline Other (See Comments)    Weight gain     Outpatient Medications Prior to Visit  Medication Sig Dispense Refill   acetaminophen (TYLENOL) 650 MG CR tablet Take 1,300 mg by mouth every 8 (eight) hours as needed for pain.     acyclovir (ZOVIRAX) 400 MG tablet TAKE 1 TABLET BY MOUTH TWICE A DAY 180 tablet 2   b complex vitamins capsule Take 1 capsule by mouth daily.     Calcium Carb-Cholecalciferol (CALCIUM 600 + D PO) Take 1 tablet by mouth daily.     carvedilol (COREG) 12.5 MG tablet Take 12.5 mg by mouth 2 (two) times daily with a meal.      Cholecalciferol (VITAMIN D3) 125 MCG (5000 UT) capsule Take 5,000 Units by mouth daily.     cyanocobalamin (VITAMIN B12) 1000 MCG tablet Take 1,000 mcg by mouth daily.     ELIQUIS 5 MG TABS tablet TAKE 1 TABLET BY MOUTH TWICE A DAY 60 tablet 5   FIBER PO Take 1 capsule by mouth 2 (two) times daily.     Fiber POWD Take 1 Scoop by mouth daily.     gabapentin (NEURONTIN) 300 MG capsule Take 300 mg by mouth 3 (three) times daily.     loratadine (CLARITIN) 10 MG tablet Take 10 mg by mouth daily.     Omega-3 Fatty Acids (FISH OIL) 1000 MG CAPS Take 1,000 mg by mouth daily.      ramipril (ALTACE) 2.5 MG tablet Take 2.5 mg by mouth daily.       simvastatin (ZOCOR) 40 MG  tablet Take 40 mg by mouth at bedtime.       tiZANidine (ZANAFLEX) 4 MG tablet Take 2 mg by mouth daily as needed for muscle spasms.     traZODone (DESYREL) 50 MG tablet Take 50 mg by mouth at bedtime.      venlafaxine (EFFEXOR) 75 MG tablet Take 75 mg by mouth daily.     vitamin E 400 UNIT capsule Take 400 Units by mouth daily.     zonisamide (ZONEGRAN) 50 MG capsule Take 50-100 mg by mouth See admin instructions. Take 50mg  by mouth in the morning and 100mg  at night.     pantoprazole (PROTONIX) 40 MG tablet Take 1 tablet (40 mg total) by mouth 2 (two) times daily. 180 tablet 3   allopurinol (ZYLOPRIM) 300 MG tablet TAKE 1/2 TABLET BY MOUTH DAILY 45 tablet 1   azithromycin (ZITHROMAX) 250 MG tablet 1 tab po daily x 4 more days, zero refills 4 each 0   predniSONE (DELTASONE) 20 MG tablet Take 3 tablets (60 mg total) by mouth daily with breakfast. Take for 5 days now x 1 course and then for 5 days after each cycle of chemotherapy (Patient taking differently: Take 60 mg by mouth daily with breakfast. Takes for 5 days after chemo) 15 tablet 6   No facility-administered medications prior to visit.    Review of Systems  Constitutional:  Negative for chills, fever, malaise/fatigue and weight loss.  HENT:  Negative for hearing loss, sore throat and tinnitus.   Eyes:  Negative for blurred vision and double vision.  Respiratory:  Negative for cough, hemoptysis, sputum production, shortness of breath, wheezing and stridor.   Cardiovascular:  Negative for chest pain, palpitations, orthopnea, leg swelling and PND.  Gastrointestinal:  Negative for abdominal pain, constipation, diarrhea, heartburn, nausea and vomiting.  Genitourinary:  Negative for dysuria, hematuria and urgency.  Musculoskeletal:  Negative for joint pain and myalgias.  Skin:  Negative for itching and rash.  Neurological:  Negative for dizziness, tingling, weakness and headaches.  Endo/Heme/Allergies:  Negative for environmental allergies.  Does not bruise/bleed easily.  Psychiatric/Behavioral:  Negative for depression. The patient is not nervous/anxious and does not have insomnia.   All other systems reviewed and are negative.    Objective:  Physical Exam Vitals reviewed.  Constitutional:      General: She is not in acute distress.    Appearance: She is well-developed.  HENT:     Head: Normocephalic and atraumatic.  Eyes:     General: No scleral icterus.    Conjunctiva/sclera: Conjunctivae normal.     Pupils: Pupils are equal, round, and reactive to light.  Neck:     Vascular: No JVD.     Trachea: No tracheal deviation.  Cardiovascular:     Rate and Rhythm: Normal rate and regular rhythm.     Heart sounds: Normal heart sounds. No murmur heard. Pulmonary:     Effort: Pulmonary effort is normal. No tachypnea, accessory muscle usage or respiratory distress.     Breath sounds: No stridor. No wheezing, rhonchi or rales.  Abdominal:     General: There is no distension.     Palpations: Abdomen is soft.     Tenderness: There is no abdominal tenderness.  Musculoskeletal:        General: No tenderness.     Cervical back: Neck supple.  Lymphadenopathy:     Cervical: No cervical adenopathy.  Skin:    General: Skin is warm and dry.     Capillary Refill: Capillary refill takes less than 2 seconds.     Findings: No rash.  Neurological:     Mental Status: She is alert and oriented to person, place, and time.  Psychiatric:        Behavior: Behavior normal.      Vitals:   07/25/23 1049  BP: 133/81  Pulse: 88  SpO2: 96%  Weight: 154 lb 9.6 oz (70.1 kg)  Height: 5\' 7"  (1.702 m)   96% on RA BMI Readings from Last 3 Encounters:  07/25/23 24.21 kg/m  05/07/23 24.26 kg/m  03/05/23 22.49 kg/m   Wt Readings from Last 3 Encounters:  07/25/23 154 lb 9.6 oz (70.1 kg)  05/07/23 154 lb 14.4 oz (70.3 kg)  03/05/23 143 lb 9.6 oz (65.1 kg)     CBC    Component Value Date/Time   WBC 3.2 (L) 05/07/2023 0935    WBC 2.3 (L) 02/16/2023 0259   RBC 4.40 05/07/2023 0935   HGB 13.1 05/07/2023 0935   HCT 38.8 05/07/2023 0935   PLT 115 (L) 05/07/2023 0935   MCV 88.2 05/07/2023 0935   MCH 29.8 05/07/2023 0935   MCHC 33.8 05/07/2023 0935   RDW 13.4 05/07/2023 0935   LYMPHSABS 0.6 (L) 05/07/2023 0935   MONOABS 0.4 05/07/2023 0935   EOSABS 0.1 05/07/2023 0935   BASOSABS 0.0 05/07/2023 0935     Chest Imaging:  04/30/2023: Hypermetabolic lung nodule correlates with CT findings of pulmonary nodule seen in October.  Has slightly grown in size. The patient's images have been independently reviewed by me.     Pulmonary Functions Testing Results:     No data to display          FeNO:   Pathology:   Echocardiogram:   Heart Catheterization:     Assessment & Plan:     ICD-10-CM  1. Lung nodule  R91.1 Procedural/ Surgical Case Request: ROBOTIC ASSISTED NAVIGATIONAL BRONCHOSCOPY    Ambulatory referral to Pulmonology    CT Super D Chest Wo Contrast      Discussion:  This is a 78 year old female, found to have a pulmonary nodule, history of diffuse large B cell.  Abnormal PET scan with hypermetabolic uptake within this nodule concerning for underlying malignancy.  Plan: Today in the office we talked about the risk benefits alternatives consideration of bronchoscopy and biopsy. Patient is agreeable to proceed. We will plan for this to be completed by Dr. Delton Coombes on my partners to facilitate and expedite the needs. Patient to follow-up in clinic with [place] after bronchoscopy and biopsy complete.   Current Outpatient Medications:    acetaminophen (TYLENOL) 650 MG CR tablet, Take 1,300 mg by mouth every 8 (eight) hours as needed for pain., Disp: , Rfl:    acyclovir (ZOVIRAX) 400 MG tablet, TAKE 1 TABLET BY MOUTH TWICE A DAY, Disp: 180 tablet, Rfl: 2   b complex vitamins capsule, Take 1 capsule by mouth daily., Disp: , Rfl:    Calcium Carb-Cholecalciferol (CALCIUM 600 + D PO), Take 1 tablet by  mouth daily., Disp: , Rfl:    carvedilol (COREG) 12.5 MG tablet, Take 12.5 mg by mouth 2 (two) times daily with a meal. , Disp: , Rfl:    Cholecalciferol (VITAMIN D3) 125 MCG (5000 UT) capsule, Take 5,000 Units by mouth daily., Disp: , Rfl:    cyanocobalamin (VITAMIN B12) 1000 MCG tablet, Take 1,000 mcg by mouth daily., Disp: , Rfl:    ELIQUIS 5 MG TABS tablet, TAKE 1 TABLET BY MOUTH TWICE A DAY, Disp: 60 tablet, Rfl: 5   FIBER PO, Take 1 capsule by mouth 2 (two) times daily., Disp: , Rfl:    Fiber POWD, Take 1 Scoop by mouth daily., Disp: , Rfl:    gabapentin (NEURONTIN) 300 MG capsule, Take 300 mg by mouth 3 (three) times daily., Disp: , Rfl:    loratadine (CLARITIN) 10 MG tablet, Take 10 mg by mouth daily., Disp: , Rfl:    Omega-3 Fatty Acids (FISH OIL) 1000 MG CAPS, Take 1,000 mg by mouth daily. , Disp: , Rfl:    ramipril (ALTACE) 2.5 MG tablet, Take 2.5 mg by mouth daily.  , Disp: , Rfl:    simvastatin (ZOCOR) 40 MG tablet, Take 40 mg by mouth at bedtime.  , Disp: , Rfl:    tiZANidine (ZANAFLEX) 4 MG tablet, Take 2 mg by mouth daily as needed for muscle spasms., Disp: , Rfl:    traZODone (DESYREL) 50 MG tablet, Take 50 mg by mouth at bedtime. , Disp: , Rfl:    venlafaxine (EFFEXOR) 75 MG tablet, Take 75 mg by mouth daily., Disp: , Rfl:    vitamin E 400 UNIT capsule, Take 400 Units by mouth daily., Disp: , Rfl:    zonisamide (ZONEGRAN) 50 MG capsule, Take 50-100 mg by mouth See admin instructions. Take 50mg  by mouth in the morning and 100mg  at night., Disp: , Rfl:    pantoprazole (PROTONIX) 40 MG tablet, Take 1 tablet (40 mg total) by mouth 2 (two) times daily., Disp: 180 tablet, Rfl: 3   Josephine Igo, DO [facility] 07/25/2023 12:12 PM

## 2023-07-25 NOTE — Progress Notes (Signed)
 Rachel Bates

## 2023-07-25 NOTE — Patient Instructions (Addendum)
 Thank you for visiting Dr. Thelda Finney at [facility]. Today we recommend the following:+  Orders Placed This Encounter  Procedures   Procedural/ Surgical Case Request: ROBOTIC ASSISTED NAVIGATIONAL BRONCHOSCOPY   Ambulatory referral to Pulmonology   Return in about 3 weeks (around 08/15/2023) for with Dara Ear, NP, or Dr. Thelda Finney, after Bronchoscopy.    Please do your part to reduce the spread of COVID-19.

## 2023-07-25 NOTE — Progress Notes (Signed)
 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  Patient has a new right upper lobe pulmonary nodule hypermetabolic on previous pet imaging was referred here for consideration of bronchoscopy and biopsy.  We talked about the lesion today and reviewed images in the office.      Past Medical History:  Diagnosis Date   AICD (automatic cardioverter/defibrillator) present dual   [employer] ---  original placedment 2007/  generator change 2014 by dr gregg taylor   Anemia    Anticoagulant long-term use    eliquis   Anxiety    Arthralgia of multiple joints    Arthritis pain    Benign hypertensive heart disease    CAD (coronary artery disease) primary cardiologist-- dr gregg taylor   MI and 2 stents 1995 in [place]   Cancer [facility])    CHF NYHA class II, chronic, systolic (HCC)    followed by dr Sharlot Gowda taylor   Degenerative disc disease, lumbar    Depression    Dyslipidemia    History of basal cell carcinoma (BCC) excision    right ankle area s/p  excision in office 06/ 2019  in office   History of DVT of lower extremity yrs ago before 2012   History of MI (myocardial infarction) 1995  in [place]   History of pulmonary embolus (PE) 2012   History of ventricular tachycardia    Hyperlipidemia    Hypersomnia    Hypertension    Ischemic cardiomyopathy    Migraines    Myocardial infarction (HCC)    OA (osteoarthritis)    "all over"   Obstructive apnea    PAF (paroxysmal atrial fibrillation) (HCC)    Primary localized osteoarthritis of right hip 12/18/2017   Primary localized osteoarthritis of right knee  05/28/2018   S/P coronary artery stent placement 1995   in [facility]   05-21-2018 per pt x2  stents in same coronary artery (unsure BM or DES)   Vitamin D deficiency disease      Family History  Problem Relation Age of Onset   Hypertension Mother    Thyroid disease Mother    Alzheimer's disease Mother    Coronary artery disease Father    Pulmonary embolism Father    Congestive Heart Failure Maternal Grandmother    Hypertension Maternal Grandmother    Heart attack Maternal Grandfather    Other Maternal Grandfather        carotid disease   Dementia Paternal Grandmother    Other Paternal Grandfather 31       accident     Past Surgical History:  Procedure Laterality Date   BIOPSY  06/19/2022   Procedure: BIOPSY;  Surgeon: Kathi Der, MD;  Location: [facility];  Service: Gastroenterology;;   CARDIAC DEFIBRILLATOR PLACEMENT  2007   CATARACT EXTRACTION, BILATERAL     COLONOSCOPY  04/14/2013   colonic polyp, status post polypectomy. Mild panocolonic diverticulosis. Small internal hemorrhoids   DILATION AND CURETTAGE OF UTERUS  yrs ago   DIRECT LARYNGOSCOPY Right 10/04/2022   Procedure: DIRECT LARYNGOSCOPY;  Surgeon: Laren Boom, DO;  Location: [facility] OR;  Service: ENT;  Laterality: Right;   ESOPHAGOGASTRODUODENOSCOPY (EGD) WITH PROPOFOL N/A 06/19/2022   Procedure: ESOPHAGOGASTRODUODENOSCOPY (EGD) WITH PROPOFOL;  Surgeon: Kathi Der, MD;  Location: [facility];  Service: Gastroenterology;  Laterality: N/A;   IMPACTION REMOVAL  06/19/2022   Procedure: IMPACTION REMOVAL;  Surgeon: Kathi Der, MD;  Location: [facility];  Service: Gastroenterology;;   IMPLANTABLE CARDIOVERTER DEFIBRILLATOR GENERATOR CHANGE N/A 02/04/2013   Procedure: IMPLANTABLE CARDIOVERTER DEFIBRILLATOR GENERATOR CHANGE;  Surgeon: Marinus Maw, MD;  Location: [facility] CATH LAB;  Service: Cardiovascular;  Laterality: N/A;   IR IMAGING GUIDED PORT INSERTION  10/27/2022   LYMPH NODE BIOPSY Right 10/04/2022    Procedure: EXCISIONAL OF RIGHT DEEP CERVICAL LYMPH NODE;  Surgeon: Laren Boom, DO;  Location: [facility];  Service: ENT;  Laterality: Right;   SHOULDER ARTHROSCOPY Right 2015   TOTAL HIP ARTHROPLASTY Right 12/18/2017   Procedure: RIGHT TOTAL HIP ARTHROPLASTY;  Surgeon: Teryl Lucy, MD;  Location: [facility];  Service: Orthopedics;  Laterality: Right;   TOTAL HIP ARTHROPLASTY Left 04/19/2021   Procedure: TOTAL HIP ARTHROPLASTY;  Surgeon: Teryl Lucy, MD;  Location: [facility];  Service: Orthopedics;  Laterality: Left;   TOTAL KNEE ARTHROPLASTY Right 05/28/2018   Procedure: TOTAL KNEE ARTHROPLASTY;  Surgeon: Teryl Lucy, MD;  Location: [facility];  Service: Orthopedics;  Laterality: Right;  Adductor Block   TOTAL KNEE ARTHROPLASTY Left 08/12/2021   Procedure: TOTAL KNEE ARTHROPLASTY;  Surgeon: Teryl Lucy, MD;  Location: [facility];  Service: Orthopedics;  Laterality: Left;   TUBAL LIGATION Bilateral yrs ago   WISDOM TOOTH EXTRACTION      Social History   Socioeconomic History   Marital status: Single    Spouse name: Not on file   Number of children: Not on file   Years of education: Not on file   Highest education level: Not on file  Occupational History   Not on file  Tobacco Use   Smoking status: Former    Current packs/day: 0.00    Types: Cigarettes    Start date: 05/21/1966    Quit date: 05/21/1996    Years since quitting: 27.1   Smokeless tobacco: Never  Vaping Use   Vaping status: Never Used  Substance and Sexual Activity   Alcohol use: No   Drug use: Never   Sexual activity: Not on file  Other Topics Concern   Not on file  Social History Narrative   Not on file   Social Drivers of Health   Financial Resource Strain: Low Risk  (05/28/2018)   Overall Financial Resource Strain (CARDIA)    Difficulty of Paying Living Expenses: Not hard at all  Food Insecurity: No Food Insecurity (02/15/2023)   Hunger Vital Sign    Worried About Running Out of Food in the Last Year:  Never true    Ran Out of Food in the Last Year: Never true  Transportation Needs: No Transportation Needs (02/15/2023)   PRAPARE - Administrator, Civil Service (Medical): No    Lack of Transportation (Non-Medical): No  Physical Activity: Not on file  Stress: Not on file  Social Connections: Not on file  Intimate Partner Violence: Unknown (02/15/2023)   Humiliation, Afraid, Rape, and Kick questionnaire    Fear of Current or Ex-Partner: No    Emotionally Abused: No    Physically Abused: Not on file    Sexually Abused: No     Allergies  Allergen Reactions   Sumatriptan Succinate Other (See Comments)    Chest pain, no triptans, pt  states "it makes my heart race"   Amitriptyline Other (See Comments)    Weight gain     Outpatient Medications Prior to Visit  Medication Sig Dispense Refill   acetaminophen (TYLENOL) 650 MG CR tablet Take 1,300 mg by mouth every 8 (eight) hours as needed for pain.     acyclovir (ZOVIRAX) 400 MG tablet TAKE 1 TABLET BY MOUTH TWICE A DAY 180 tablet 2   b complex vitamins capsule Take 1 capsule by mouth daily.     Calcium Carb-Cholecalciferol (CALCIUM 600 + D PO) Take 1 tablet by mouth daily.     carvedilol (COREG) 12.5 MG tablet Take 12.5 mg by mouth 2 (two) times daily with a meal.      Cholecalciferol (VITAMIN D3) 125 MCG (5000 UT) capsule Take 5,000 Units by mouth daily.     cyanocobalamin (VITAMIN B12) 1000 MCG tablet Take 1,000 mcg by mouth daily.     ELIQUIS 5 MG TABS tablet TAKE 1 TABLET BY MOUTH TWICE A DAY 60 tablet 5   FIBER PO Take 1 capsule by mouth 2 (two) times daily.     Fiber POWD Take 1 Scoop by mouth daily.     gabapentin (NEURONTIN) 300 MG capsule Take 300 mg by mouth 3 (three) times daily.     loratadine (CLARITIN) 10 MG tablet Take 10 mg by mouth daily.     Omega-3 Fatty Acids (FISH OIL) 1000 MG CAPS Take 1,000 mg by mouth daily.      ramipril (ALTACE) 2.5 MG tablet Take 2.5 mg by mouth daily.       simvastatin (ZOCOR) 40 MG  tablet Take 40 mg by mouth at bedtime.       tiZANidine (ZANAFLEX) 4 MG tablet Take 2 mg by mouth daily as needed for muscle spasms.     traZODone (DESYREL) 50 MG tablet Take 50 mg by mouth at bedtime.      venlafaxine (EFFEXOR) 75 MG tablet Take 75 mg by mouth daily.     vitamin E 400 UNIT capsule Take 400 Units by mouth daily.     zonisamide (ZONEGRAN) 50 MG capsule Take 50-100 mg by mouth See admin instructions. Take 50mg  by mouth in the morning and 100mg  at night.     pantoprazole (PROTONIX) 40 MG tablet Take 1 tablet (40 mg total) by mouth 2 (two) times daily. 180 tablet 3   allopurinol (ZYLOPRIM) 300 MG tablet TAKE 1/2 TABLET BY MOUTH DAILY 45 tablet 1   azithromycin (ZITHROMAX) 250 MG tablet 1 tab po daily x 4 more days, zero refills 4 each 0   predniSONE (DELTASONE) 20 MG tablet Take 3 tablets (60 mg total) by mouth daily with breakfast. Take for 5 days now x 1 course and then for 5 days after each cycle of chemotherapy (Patient taking differently: Take 60 mg by mouth daily with breakfast. Takes for 5 days after chemo) 15 tablet 6   No facility-administered medications prior to visit.    Review of Systems  Constitutional:  Negative for chills, fever, malaise/fatigue and weight loss.  HENT:  Negative for hearing loss, sore throat and tinnitus.   Eyes:  Negative for blurred vision and double vision.  Respiratory:  Negative for cough, hemoptysis, sputum production, shortness of breath, wheezing and stridor.   Cardiovascular:  Negative for chest pain, palpitations, orthopnea, leg swelling and PND.  Gastrointestinal:  Negative for abdominal pain, constipation, diarrhea, heartburn, nausea and vomiting.  Genitourinary:  Negative for dysuria, hematuria and urgency.  Musculoskeletal:  Negative for joint pain and myalgias.  Skin:  Negative for itching and rash.  Neurological:  Negative for dizziness, tingling, weakness and headaches.  Endo/Heme/Allergies:  Negative for environmental allergies.  Does not bruise/bleed easily.  Psychiatric/Behavioral:  Negative for depression. The patient is not nervous/anxious and does not have insomnia.   All other systems reviewed and are negative.    Objective:  Physical Exam Vitals reviewed.  Constitutional:      General: She is not in acute distress.    Appearance: She is well-developed.  HENT:     Head: Normocephalic and atraumatic.  Eyes:     General: No scleral icterus.    Conjunctiva/sclera: Conjunctivae normal.     Pupils: Pupils are equal, round, and reactive to light.  Neck:     Vascular: No JVD.     Trachea: No tracheal deviation.  Cardiovascular:     Rate and Rhythm: Normal rate and regular rhythm.     Heart sounds: Normal heart sounds. No murmur heard. Pulmonary:     Effort: Pulmonary effort is normal. No tachypnea, accessory muscle usage or respiratory distress.     Breath sounds: No stridor. No wheezing, rhonchi or rales.  Abdominal:     General: There is no distension.     Palpations: Abdomen is soft.     Tenderness: There is no abdominal tenderness.  Musculoskeletal:        General: No tenderness.     Cervical back: Neck supple.  Lymphadenopathy:     Cervical: No cervical adenopathy.  Skin:    General: Skin is warm and dry.     Capillary Refill: Capillary refill takes less than 2 seconds.     Findings: No rash.  Neurological:     Mental Status: She is alert and oriented to person, place, and time.  Psychiatric:        Behavior: Behavior normal.      Vitals:   07/25/23 1049  BP: 133/81  Pulse: 88  SpO2: 96%  Weight: 154 lb 9.6 oz (70.1 kg)  Height: 5\' 7"  (1.702 m)   96% on RA BMI Readings from Last 3 Encounters:  07/25/23 24.21 kg/m  05/07/23 24.26 kg/m  03/05/23 22.49 kg/m   Wt Readings from Last 3 Encounters:  07/25/23 154 lb 9.6 oz (70.1 kg)  05/07/23 154 lb 14.4 oz (70.3 kg)  03/05/23 143 lb 9.6 oz (65.1 kg)     CBC    Component Value Date/Time   WBC 3.2 (L) 05/07/2023 0935    WBC 2.3 (L) 02/16/2023 0259   RBC 4.40 05/07/2023 0935   HGB 13.1 05/07/2023 0935   HCT 38.8 05/07/2023 0935   PLT 115 (L) 05/07/2023 0935   MCV 88.2 05/07/2023 0935   MCH 29.8 05/07/2023 0935   MCHC 33.8 05/07/2023 0935   RDW 13.4 05/07/2023 0935   LYMPHSABS 0.6 (L) 05/07/2023 0935   MONOABS 0.4 05/07/2023 0935   EOSABS 0.1 05/07/2023 0935   BASOSABS 0.0 05/07/2023 0935     Chest Imaging:  04/30/2023: Hypermetabolic lung nodule correlates with CT findings of pulmonary nodule seen in October.  Has slightly grown in size. The patient's images have been independently reviewed by me.     Pulmonary Functions Testing Results:     No data to display          FeNO:   Pathology:   Echocardiogram:   Heart Catheterization:     Assessment & Plan:     ICD-10-CM  1. Lung nodule  R91.1 Procedural/ Surgical Case Request: ROBOTIC ASSISTED NAVIGATIONAL BRONCHOSCOPY    Ambulatory referral to Pulmonology    CT Super D Chest Wo Contrast      Discussion:  This is a 78 year old female, found to have a pulmonary nodule, history of diffuse large B cell.  Abnormal PET scan with hypermetabolic uptake within this nodule concerning for underlying malignancy.  Plan: Today in the office we talked about the risk benefits alternatives consideration of bronchoscopy and biopsy. Patient is agreeable to proceed. We will plan for this to be completed by Dr. Delton Coombes on my partners to facilitate and expedite the needs. Patient to follow-up in clinic with [place] after bronchoscopy and biopsy complete.   Current Outpatient Medications:    acetaminophen (TYLENOL) 650 MG CR tablet, Take 1,300 mg by mouth every 8 (eight) hours as needed for pain., Disp: , Rfl:    acyclovir (ZOVIRAX) 400 MG tablet, TAKE 1 TABLET BY MOUTH TWICE A DAY, Disp: 180 tablet, Rfl: 2   b complex vitamins capsule, Take 1 capsule by mouth daily., Disp: , Rfl:    Calcium Carb-Cholecalciferol (CALCIUM 600 + D PO), Take 1 tablet by  mouth daily., Disp: , Rfl:    carvedilol (COREG) 12.5 MG tablet, Take 12.5 mg by mouth 2 (two) times daily with a meal. , Disp: , Rfl:    Cholecalciferol (VITAMIN D3) 125 MCG (5000 UT) capsule, Take 5,000 Units by mouth daily., Disp: , Rfl:    cyanocobalamin (VITAMIN B12) 1000 MCG tablet, Take 1,000 mcg by mouth daily., Disp: , Rfl:    ELIQUIS 5 MG TABS tablet, TAKE 1 TABLET BY MOUTH TWICE A DAY, Disp: 60 tablet, Rfl: 5   FIBER PO, Take 1 capsule by mouth 2 (two) times daily., Disp: , Rfl:    Fiber POWD, Take 1 Scoop by mouth daily., Disp: , Rfl:    gabapentin (NEURONTIN) 300 MG capsule, Take 300 mg by mouth 3 (three) times daily., Disp: , Rfl:    loratadine (CLARITIN) 10 MG tablet, Take 10 mg by mouth daily., Disp: , Rfl:    Omega-3 Fatty Acids (FISH OIL) 1000 MG CAPS, Take 1,000 mg by mouth daily. , Disp: , Rfl:    ramipril (ALTACE) 2.5 MG tablet, Take 2.5 mg by mouth daily.  , Disp: , Rfl:    simvastatin (ZOCOR) 40 MG tablet, Take 40 mg by mouth at bedtime.  , Disp: , Rfl:    tiZANidine (ZANAFLEX) 4 MG tablet, Take 2 mg by mouth daily as needed for muscle spasms., Disp: , Rfl:    traZODone (DESYREL) 50 MG tablet, Take 50 mg by mouth at bedtime. , Disp: , Rfl:    venlafaxine (EFFEXOR) 75 MG tablet, Take 75 mg by mouth daily., Disp: , Rfl:    vitamin E 400 UNIT capsule, Take 400 Units by mouth daily., Disp: , Rfl:    zonisamide (ZONEGRAN) 50 MG capsule, Take 50-100 mg by mouth See admin instructions. Take 50mg  by mouth in the morning and 100mg  at night., Disp: , Rfl:    pantoprazole (PROTONIX) 40 MG tablet, Take 1 tablet (40 mg total) by mouth 2 (two) times daily., Disp: 180 tablet, Rfl: 3   Josephine Igo, DO [facility] 07/25/2023 12:12 PM

## 2023-07-26 ENCOUNTER — Encounter: Payer: Self-pay | Admitting: Pulmonary Disease

## 2023-07-27 ENCOUNTER — Ambulatory Visit: Payer: Medicare HMO | Attending: Internal Medicine | Admitting: Internal Medicine

## 2023-07-27 ENCOUNTER — Encounter: Payer: Self-pay | Admitting: Internal Medicine

## 2023-07-27 VITALS — BP 116/74 | HR 77 | Ht 67.0 in | Wt 156.6 lb

## 2023-07-27 DIAGNOSIS — Z01812 Encounter for preprocedural laboratory examination: Secondary | ICD-10-CM | POA: Diagnosis not present

## 2023-07-27 LAB — CUP PACEART INCLINIC DEVICE CHECK
Date Time Interrogation Session: 20250117103231
Implantable Lead Connection Status: 753985
Implantable Lead Connection Status: 753985
Implantable Lead Implant Date: 20071003
Implantable Lead Implant Date: 20071019
Implantable Lead Location: 753859
Implantable Lead Location: 753860
Implantable Lead Model: 5076
Implantable Lead Model: 6947
Implantable Pulse Generator Implant Date: 20140729

## 2023-07-27 NOTE — Progress Notes (Signed)
HPI Rachel Bates returns today for followup. She is a pleasant 78 yo woman with an ICM, PAF, s/p ICD insertion. She has done well in the interim with no chest pain or sob. No syncope. No ICD therapies.  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 She has had 8 cycles and a repeat echo done a few weeks ago showed her EF is 50%. She has never had any ICD therapies and paces in the atrium 5% of the time.  Allergies  Allergen Reactions   Sumatriptan Succinate Other (See Comments)    Chest pain, no triptans, pt states "it makes my heart race"   Amitriptyline Other (See Comments)    Weight gain     Current Outpatient Medications  Medication Sig Dispense Refill   acetaminophen (TYLENOL) 650 MG CR tablet Take 1,300 mg by mouth every 8 (eight) hours as needed for pain.     acyclovir (ZOVIRAX) 400 MG tablet TAKE 1 TABLET BY MOUTH TWICE A DAY 180 tablet 2   b complex vitamins capsule Take 1 capsule by mouth daily.     Calcium Carb-Cholecalciferol (CALCIUM 600 + D PO) Take 1 tablet by mouth daily.     carvedilol (COREG) 12.5 MG tablet Take 12.5 mg by mouth 2 (two) times daily with a meal.      Cholecalciferol (VITAMIN D3) 125 MCG (5000 UT) capsule Take 5,000 Units by mouth daily.     cyanocobalamin (VITAMIN B12) 1000 MCG tablet Take 1,000 mcg by mouth daily.     ELIQUIS 5 MG TABS tablet TAKE 1 TABLET BY MOUTH TWICE A DAY 60 tablet 5   FIBER PO Take 1 capsule by mouth 2 (two) times daily.     Fiber POWD Take 1 Scoop by mouth daily.     gabapentin (NEURONTIN) 300 MG capsule Take 300 mg by mouth 3 (three) times daily.     loratadine (CLARITIN) 10 MG tablet Take 10 mg by mouth daily.     Omega-3 Fatty Acids (FISH OIL) 1000 MG CAPS Take 1,000 mg by mouth daily.      ramipril (ALTACE) 2.5 MG tablet Take 2.5 mg by mouth daily.       simvastatin (ZOCOR) 40 MG tablet Take 40 mg by mouth at bedtime.       tiZANidine (ZANAFLEX) 4 MG tablet Take 2 mg by mouth daily  as needed for muscle spasms.     traZODone (DESYREL) 50 MG tablet Take 50 mg by mouth at bedtime.      venlafaxine (EFFEXOR) 75 MG tablet Take 75 mg by mouth daily.     vitamin E 400 UNIT capsule Take 400 Units by mouth daily.     zonisamide (ZONEGRAN) 50 MG capsule Take 50-100 mg by mouth See admin instructions. Take 50mg  by mouth in the morning and 100mg  at night.     No current facility-administered medications for this visit.     Past Medical History:  Diagnosis Date   AICD (automatic cardioverter/defibrillator) present dual   Medtronic ---  original placedment 2007/  generator change 2014 by dr Rylyn Ranganathan   Anemia    Anticoagulant long-term use    eliquis   Anxiety    Arthralgia of multiple joints    Arthritis pain    Benign hypertensive heart disease    CAD (coronary artery disease) primary cardiologist-- dr Davidlee Jeanbaptiste   MI and 2 stents 1995 in [place]   Cancer [facility])  CHF NYHA class II, chronic, systolic (HCC)    followed by dr Sharlot Gowda Janete Quilling   Degenerative disc disease, lumbar    Depression    Dyslipidemia    History of basal cell carcinoma (BCC) excision    right ankle area s/p  excision in office 06/ 2019  in office   History of DVT of lower extremity yrs ago before 2012   History of MI (myocardial infarction) 1995  in [place]   History of pulmonary embolus (PE) 2012   History of ventricular tachycardia    Hyperlipidemia    Hypersomnia    Hypertension    Ischemic cardiomyopathy    Migraines    Myocardial infarction (HCC)    OA (osteoarthritis)    "all over"   Obstructive apnea    PAF (paroxysmal atrial fibrillation) (HCC)    Primary localized osteoarthritis of right hip 12/18/2017   Primary localized osteoarthritis of right knee 05/28/2018   S/P coronary artery stent placement 1995   in [facility]   05-21-2018 per pt x2  stents in same coronary artery (unsure BM or DES)   Vitamin D deficiency disease     ROS:   All systems reviewed and negative except as  noted in the HPI.   Past Surgical History:  Procedure Laterality Date   BIOPSY  06/19/2022   Procedure: BIOPSY;  Surgeon: Kathi Der, MD;  Location: [facility];  Service: Gastroenterology;;   CARDIAC DEFIBRILLATOR PLACEMENT  2007   CATARACT EXTRACTION, BILATERAL     COLONOSCOPY  04/14/2013   colonic polyp, status post polypectomy. Mild panocolonic diverticulosis. Small internal hemorrhoids   DILATION AND CURETTAGE OF UTERUS  yrs ago   DIRECT LARYNGOSCOPY Right 10/04/2022   Procedure: DIRECT LARYNGOSCOPY;  Surgeon: Cheron Schaumann A, DO;  Location: [facility];  Service: ENT;  Laterality: Right;   ESOPHAGOGASTRODUODENOSCOPY (EGD) WITH PROPOFOL N/A 06/19/2022   Procedure: ESOPHAGOGASTRODUODENOSCOPY (EGD) WITH PROPOFOL;  Surgeon: Kathi Der, MD;  Location: [facility];  Service: Gastroenterology;  Laterality: N/A;   IMPACTION REMOVAL  06/19/2022   Procedure: IMPACTION REMOVAL;  Surgeon: Kathi Der, MD;  Location: [facility];  Service: Gastroenterology;;   IMPLANTABLE CARDIOVERTER DEFIBRILLATOR GENERATOR CHANGE N/A 02/04/2013   Procedure: IMPLANTABLE CARDIOVERTER DEFIBRILLATOR GENERATOR CHANGE;  Surgeon: Marinus Maw, MD;  Location[facility] CATH LAB;  Service: Cardiovascular;  Laterality: N/A;   IR IMAGING GUIDED PORT INSERTION  10/27/2022   LYMPH NODE BIOPSY Right 10/04/2022   Procedure: EXCISIONAL OF RIGHT DEEP CERVICAL LYMPH NODE;  Surgeon: Laren Boom, DO;  Location: [facility];  Service: ENT;  Laterality: Right;   SHOULDER ARTHROSCOPY Right 2015   TOTAL HIP ARTHROPLASTY Right 12/18/2017   Procedure: RIGHT TOTAL HIP ARTHROPLASTY;  Surgeon: Teryl Lucy, MD;  Location: [facility];  Service: Orthopedics;  Laterality: Right;   TOTAL HIP ARTHROPLASTY Left 04/19/2021   Procedure: TOTAL HIP ARTHROPLASTY;  Surgeon: Teryl Lucy, MD;  Location: [facility];  Service: Orthopedics;  Laterality: Left;   TOTAL KNEE ARTHROPLASTY Right 05/28/2018   Procedure: TOTAL KNEE ARTHROPLASTY;   Surgeon: Teryl Lucy, MD;  Location: [facility];  Service: Orthopedics;  Laterality: Right;  Adductor Block   TOTAL KNEE ARTHROPLASTY Left 08/12/2021   Procedure: TOTAL KNEE ARTHROPLASTY;  Surgeon: Teryl Lucy, MD;  Location: [facility];  Service: Orthopedics;  Laterality: Left;   TUBAL LIGATION Bilateral yrs ago   WISDOM TOOTH EXTRACTION       Family History  Problem Relation Age of Onset   Hypertension Mother    Thyroid disease Mother  Alzheimer's disease Mother    Coronary artery disease Father    Pulmonary embolism Father    Congestive Heart Failure Maternal Grandmother    Hypertension Maternal Grandmother    Heart attack Maternal Grandfather    Other Maternal Grandfather        carotid disease   Dementia Paternal Grandmother    Other Paternal Grandfather 69       accident     Social History   Socioeconomic History   Marital status: Single    Spouse name: Not on file   Number of children: Not on file   Years of education: Not on file   Highest education level: Not on file  Occupational History   Not on file  Tobacco Use   Smoking status: Former    Current packs/day: 0.00    Types: Cigarettes    Start date: 05/21/1966    Quit date: 05/21/1996    Years since quitting: 27.2   Smokeless tobacco: Never  Vaping Use   Vaping status: Never Used  Substance and Sexual Activity   Alcohol use: No   Drug use: Never   Sexual activity: Not on file  Other Topics Concern   Not on file  Social History Narrative   Not on file   Social Drivers of Health   Financial Resource Strain: Low Risk  (05/28/2018)   Overall Financial Resource Strain (CARDIA)    Difficulty of Paying Living Expenses: Not hard at all  Food Insecurity: No Food Insecurity (02/15/2023)   Hunger Vital Sign    Worried About Running Out of Food in the Last Year: Never true    Ran Out of Food in the Last Year: Never true  Transportation Needs: No Transportation Needs (02/15/2023)   PRAPARE - Therapist, art (Medical): No    Lack of Transportation (Non-Medical): No  Physical Activity: Not on file  Stress: Not on file  Social Connections: Not on file  Intimate Partner Violence: Unknown (02/15/2023)   Humiliation, Afraid, Rape, and Kick questionnaire    Fear of Current or Ex-Partner: No    Emotionally Abused: No    Physically Abused: Not on file    Sexually Abused: No     BP 116/74   Pulse 77   Ht 5\' 7"  (1.702 m)   Wt 156 lb 9.6 oz (71 kg)   SpO2 95%   BMI 24.53 kg/m   Physical Exam:  Well appearing NAD HEENT: Unremarkable Neck:  No JVD, no thyromegally Lymphatics:  No adenopathy Back:  No CVA tenderness Lungs:  Clear HEART:  Regular rate rhythm, no murmurs, no rubs, no clicks Abd:  soft, positive bowel sounds, no organomegally, no rebound, no guarding Ext:  2 plus pulses, no edema, no cyanosis, no clubbing Skin:  No rashes no nodules Neuro:  CN II through XII intact, motor grossly intact  EKG  DEVICE  Normal device function.  See PaceArt for details.   Assess/Plan:  ICM - she has a repeat echo of 50%. I have discussed the treatment options and recommend watchful waiting.  ICD - she is almost at [facility]. I discussed the treatment options and we will tentatively plan to hold off on ICD gen change as her EF is improved and she has never had any tachy therapies. She will undergo removal of the device and capping of the leads. Sinus brady - she is pacing 5%. I have reprogrammed her to VVI 30/min and she has not had any  pacing.   Sharlot Gowda Kory Panjwani,MD

## 2023-07-27 NOTE — Patient Instructions (Signed)
Medication Instructions:  Your physician recommends that you continue on your current medications as directed. Please refer to the Current Medication list given to you today.  *If you need a refill on your cardiac medications before your next appointment, please call your pharmacy*  Lab Work: CBC BMET - With in 30 days of procedure  If you have labs (blood work) drawn today and your tests are completely normal, you will receive your results only by: MyChart Message (if you have MyChart) OR A paper copy in the mail If you have any lab test that is abnormal or we need to change your treatment, we will call you to review the results.  Testing/Procedures: None ordered.  Follow-Up: At [facility], you and your health needs are our priority.  As part of our continuing mission to provide you with exceptional heart care, we have created designated Provider Care Teams.  These Care Teams include your primary Cardiologist (physician) and Advanced Practice Providers (APPs -  Physician Assistants and Nurse Practitioners) who all work together to provide you with the care you need, when you need it.  We recommend signing up for the patient portal called "MyChart".  Sign up information is provided on this After Visit Summary.  MyChart is used to connect with patients for Virtual Visits (Telemedicine).  Patients are able to view lab/test results, encounter notes, upcoming appointments, etc.  Non-urgent messages can be sent to your provider as well.   To learn more about what you can do with MyChart, go to [email or web address].    Your next appointment:   To be scheduled  The format for your next appointment:   In Person  Provider:   Lewayne Bunting, MD{or one of the following Advanced Practice Providers on your designated Care Team:   Francis Dowse, [place] Casimiro Needle "Mardelle Matte" [place], [place] Earnest Rosier, NP    Important Information About Sugar

## 2023-08-02 ENCOUNTER — Other Ambulatory Visit: Payer: Self-pay

## 2023-08-02 DIAGNOSIS — C8338 Diffuse large B-cell lymphoma, lymph nodes of multiple sites: Secondary | ICD-10-CM

## 2023-08-03 ENCOUNTER — Inpatient Hospital Stay: Payer: Medicare HMO | Admitting: Hematology

## 2023-08-03 ENCOUNTER — Other Ambulatory Visit: Payer: Self-pay

## 2023-08-03 ENCOUNTER — Encounter (HOSPITAL_COMMUNITY): Payer: Self-pay | Admitting: Emergency Medicine

## 2023-08-03 ENCOUNTER — Encounter: Payer: Self-pay | Admitting: Internal Medicine

## 2023-08-03 ENCOUNTER — Ambulatory Visit
Admission: RE | Admit: 2023-08-03 | Discharge: 2023-08-03 | Disposition: A | Discharge status: Home / Self Care | Payer: Medicare HMO | Source: Ambulatory Visit | Attending: Pulmonary Disease

## 2023-08-03 ENCOUNTER — Inpatient Hospital Stay: Payer: Medicare HMO | Attending: Hematology

## 2023-08-03 VITALS — BP 124/63 | HR 74 | Temp 97.0°F | Resp 15 | Wt 156.3 lb

## 2023-08-03 DIAGNOSIS — Z87891 Personal history of nicotine dependence: Secondary | ICD-10-CM | POA: Diagnosis not present

## 2023-08-03 DIAGNOSIS — R911 Solitary pulmonary nodule: Secondary | ICD-10-CM | POA: Diagnosis not present

## 2023-08-03 DIAGNOSIS — C8331 Diffuse large B-cell lymphoma, lymph nodes of head, face, and neck: Secondary | ICD-10-CM | POA: Diagnosis not present

## 2023-08-03 DIAGNOSIS — C8338 Diffuse large B-cell lymphoma, lymph nodes of multiple sites: Secondary | ICD-10-CM | POA: Diagnosis not present

## 2023-08-03 DIAGNOSIS — D696 Thrombocytopenia, unspecified: Secondary | ICD-10-CM | POA: Diagnosis not present

## 2023-08-03 DIAGNOSIS — Z79899 Other long term (current) drug therapy: Secondary | ICD-10-CM | POA: Insufficient documentation

## 2023-08-03 DIAGNOSIS — I700 Atherosclerosis of aorta: Secondary | ICD-10-CM | POA: Diagnosis not present

## 2023-08-03 DIAGNOSIS — R918 Other nonspecific abnormal finding of lung field: Secondary | ICD-10-CM | POA: Diagnosis not present

## 2023-08-03 DIAGNOSIS — R599 Enlarged lymph nodes, unspecified: Secondary | ICD-10-CM | POA: Diagnosis not present

## 2023-08-03 LAB — CBC WITH DIFFERENTIAL (CANCER CENTER ONLY)
Abs Immature Granulocytes: 0.02 10*3/uL (ref 0.00–0.07)
Basophils Absolute: 0 10*3/uL (ref 0.0–0.1)
Basophils Relative: 0 %
Eosinophils Absolute: 0.1 10*3/uL (ref 0.0–0.5)
Eosinophils Relative: 2 %
HCT: 41.8 % (ref 36.0–46.0)
Hemoglobin: 14.3 g/dL (ref 12.0–15.0)
Immature Granulocytes: 1 %
Lymphocytes Relative: 16 %
Lymphs Abs: 0.7 10*3/uL (ref 0.7–4.0)
MCH: 30.2 pg (ref 26.0–34.0)
MCHC: 34.2 g/dL (ref 30.0–36.0)
MCV: 88.4 fL (ref 80.0–100.0)
Monocytes Absolute: 0.5 10*3/uL (ref 0.1–1.0)
Monocytes Relative: 12 %
Neutro Abs: 2.8 10*3/uL (ref 1.7–7.7)
Neutrophils Relative %: 69 %
Platelet Count: 107 10*3/uL — ABNORMAL LOW (ref 150–400)
RBC: 4.73 MIL/uL (ref 3.87–5.11)
RDW: 15.4 % (ref 11.5–15.5)
WBC Count: 4 10*3/uL (ref 4.0–10.5)
nRBC: 0 % (ref 0.0–0.2)

## 2023-08-03 LAB — CMP (CANCER CENTER ONLY)
ALT: 19 U/L (ref 0–44)
AST: 24 U/L (ref 15–41)
Albumin: 4.5 g/dL (ref 3.5–5.0)
Alkaline Phosphatase: 82 U/L (ref 38–126)
Anion gap: 6 (ref 5–15)
BUN: 13 mg/dL (ref 8–23)
CO2: 31 mmol/L (ref 22–32)
Calcium: 10.3 mg/dL (ref 8.9–10.3)
Chloride: 104 mmol/L (ref 98–111)
Creatinine: 0.72 mg/dL (ref 0.44–1.00)
GFR, Estimated: >60 mL/min (ref >60)
Glucose, Bld: 87 mg/dL (ref 70–99)
Potassium: 4.3 mmol/L (ref 3.5–5.1)
Sodium: 141 mmol/L (ref 135–145)
Total Bilirubin: 0.7 mg/dL (ref 0.0–1.2)
Total Protein: 6.9 g/dL (ref 6.5–8.1)

## 2023-08-03 LAB — LACTATE DEHYDROGENASE: LDH: 180 U/L (ref 98–192)

## 2023-08-03 NOTE — Progress Notes (Signed)
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 female who has been referred to [place] by Carylon Perches, DO for evaluation and management of non hodgkin's lymphoma. She has a hx of chronic systolic heart failure.  Nasolaryngoscopy revealed a mass lesion of the left oropharynx, emanating from the left base of tongue/glossotonsillar sulcus. Patient subsequently had a CT neck with contrast performed on 08/09/2022, which reported extensive bilateral adenopaty, with greatest lymph node conglomerate measuring 3.2 x 2.4 cm in the right level 2. Patient underwent core needle biopsy on 08/18/22 and endorsed persistent throat pain and odynophagia at the time. She is a former smoker and quit in the late 90s. SHe endorsed heavy tobacco use prior to quitting, with 1.5 pack/day for about 30 years.   Today, she is accompanied by her daughter. She complains of a sore throat beginning around Thanksgiving time. She was given antibiotics and prednisone which did not improve symptoms. She endorses swelling in her neck with associated soreness which came quickly past 2-3 weeks. She reports that her neck edema has recently grown on both the left and right sides. Her neck pain is in a specific area. She also complains of recent her neck pain if she turns her neck.  She did present Emergency EMT because something was stuck in her throat. Endoscopy revealed that it was food and it was pushed down. An ulcer was also found in her esophagus and some narrowing which will be stretched in three  months. She has not had any issues with swallowing food since then. She has not had a biopsy of issue on the back of her tongue. No other lumps/bumps, unexplained fevers, chills, night sweat, change in breathing, abdominal pain, or skin rashes. She reports discomfort with drinking tea which caused her to eat less as well.   She is allergic to Sumatriptan and another medication she cannot recall. She does not consume alcohol and has quit smoking in the 90s. 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 She believes the sore was caused by shoe discomfort.  She denies any facial puffiness, abdominal pain, recent change in bowel habits or urination. She reports that her defibrillator has never gone off.   She reports a ganglion cyst on her right knee, 2 knee replacements, and 2 hip replacements. She denies a Fhx of blood disorders. However, her sister has lung cancer and was a frequent smoker.  She is UTD with her vaccinations, incuding influenza, RSV, and COVID-19 booster. She has endorsed migraines since childhood. She lives on her own is able to complete daily activities independently. She will follow-up with Dr. Marene Lenz soon. She follows up with cardiologist regularly. Her a fib, pacemaker, and ICD device have been stable. She has not had a recent echo.   INTERVAL HISTORY:  Rachel Bates is a 78 y.o. female who presents today for follow up and management of diffuse large B-cell lymphoma.  Patient was last seen by me on 05/07/2023 and reported previously endorsing cold symptoms including congestion  and chills, which had resolved prior to the visit. She was doing well overall at the time of the clinical visit.   Patient was seen by Dr. Tonia Brooms on 07/25/2023  Today, she reports no new symptoms since her last clinical visit. Patient denies any concern for fever, chills, night sweats, unexpected weight loss, or  lack of appetite.  -educated patient on the purpose and indications of imaging and biopsies in great detail  -treated with local radiation vs surgery    -discussed that there is a need for biopsy prior to consideration of treatment, pulmonologist finds obvious primary lung cancer based on scan   She reports that Dr. Tonia Brooms has left the practice.  Patient will be seen by Dr. Delton Coombes on Tuesday, 08/07/2023 for biopsy. She reports that she will connect with the pulmonary team on 08/14/2023 to discuss her biopsy results.   She denies any lung symptoms such as chest pain or SOB. Patient denies any infections.   Patient reports that her sister has a hx of lung cancer and was not symptomatic when she was diagnosed with stage 4 lung cancer. Patient eventually endorsed palpitations when she was found to have lung cancer.   She was seen by cardiologist last Friday and reports stable findings. Patient notes that she has been using a defibrillator for 17 years and there are plans to remove her defibrillator. She notes that her heart function has improved.   MEDICAL HISTORY:  Past Medical History:  Diagnosis Date   AICD (automatic cardioverter/defibrillator) present dual   Medtronic ---  original placedment 2007/  generator change 2014 by dr gregg taylor   Anemia    Anticoagulant long-term use    eliquis   Anxiety    Arthralgia of multiple joints    Arthritis pain    Benign hypertensive heart disease    CAD (coronary artery disease) primary cardiologist-- dr gregg taylor   MI and 2 stents 1995 in [place]   Cancer [facility])    CHF NYHA class II, chronic, systolic (HCC)    followed by dr Sharlot Gowda taylor   Degenerative disc disease, lumbar    Depression    Dyslipidemia    History of basal cell carcinoma (BCC) excision    right ankle area s/p  excision in office 06/ 2019  in office   History of DVT of lower extremity yrs ago before 2012   History of MI (myocardial infarction) 1995  in [place]   History of  pulmonary embolus (PE) 2012   History of ventricular tachycardia    Hyperlipidemia    Hypersomnia    Hypertension    Ischemic cardiomyopathy    Migraines    Myocardial infarction (HCC)    OA (osteoarthritis)    "all over"   Obstructive apnea    PAF (paroxysmal atrial fibrillation) (HCC)    Primary localized osteoarthritis of right hip 12/18/2017   Primary localized osteoarthritis of right knee 05/28/2018   S/P coronary artery stent placement 1995   in [facility]   05-21-2018 per pt x2  stents in same coronary artery (unsure BM or DES)   Vitamin D deficiency disease     SURGICAL HISTORY: Past Surgical History:  Procedure Laterality Date   BIOPSY  06/19/2022   Procedure: BIOPSY;  Surgeon: Kathi Der, MD;  Location: [facility];  Service: Gastroenterology;;   CARDIAC DEFIBRILLATOR PLACEMENT  2007   CATARACT EXTRACTION, BILATERAL     COLONOSCOPY  04/14/2013   colonic polyp, status post polypectomy. Mild panocolonic diverticulosis.  Small internal hemorrhoids   DILATION AND CURETTAGE OF UTERUS  yrs ago   DIRECT LARYNGOSCOPY Right 10/04/2022   Procedure: DIRECT LARYNGOSCOPY;  Surgeon: Cheron Schaumann A, DO;  Location: [facility];  Service: ENT;  Laterality: Right;   ESOPHAGOGASTRODUODENOSCOPY (EGD) WITH PROPOFOL N/A 06/19/2022   Procedure: ESOPHAGOGASTRODUODENOSCOPY (EGD) WITH PROPOFOL;  Surgeon: Kathi Der, MD;  Location: [facility];  Service: Gastroenterology;  Laterality: N/A;   IMPACTION REMOVAL  06/19/2022   Procedure: IMPACTION REMOVAL;  Surgeon: Kathi Der, MD;  Location: [facility];  Service: Gastroenterology;;   IMPLANTABLE CARDIOVERTER DEFIBRILLATOR GENERATOR CHANGE N/A 02/04/2013   Procedure: IMPLANTABLE CARDIOVERTER DEFIBRILLATOR GENERATOR CHANGE;  Surgeon: Marinus Maw, MD;  Location: [facility] CATH LAB;  Service: Cardiovascular;  Laterality: N/A;   IR IMAGING GUIDED PORT INSERTION  10/27/2022   LYMPH NODE BIOPSY Right 10/04/2022   Procedure: EXCISIONAL OF RIGHT  DEEP CERVICAL LYMPH NODE;  Surgeon: Laren Boom, DO;  Location: [facility];  Service: ENT;  Laterality: Right;   SHOULDER ARTHROSCOPY Right 2015   TOTAL HIP ARTHROPLASTY Right 12/18/2017   Procedure: RIGHT TOTAL HIP ARTHROPLASTY;  Surgeon: Teryl Lucy, MD;  Location: [facility] OR;  Service: Orthopedics;  Laterality: Right;   TOTAL HIP ARTHROPLASTY Left 04/19/2021   Procedure: TOTAL HIP ARTHROPLASTY;  Surgeon: Teryl Lucy, MD;  Location: [facility];  Service: Orthopedics;  Laterality: Left;   TOTAL KNEE ARTHROPLASTY Right 05/28/2018   Procedure: TOTAL KNEE ARTHROPLASTY;  Surgeon: Teryl Lucy, MD;  Location: [facility];  Service: Orthopedics;  Laterality: Right;  Adductor Block   TOTAL KNEE ARTHROPLASTY Left 08/12/2021   Procedure: TOTAL KNEE ARTHROPLASTY;  Surgeon: Teryl Lucy, MD;  Location: [facility];  Service: Orthopedics;  Laterality: Left;   TUBAL LIGATION Bilateral yrs ago   WISDOM TOOTH EXTRACTION      SOCIAL HISTORY: Social History   Socioeconomic History   Marital status: Single    Spouse name: Not on file   Number of children: Not on file   Years of education: Not on file   Highest education level: Not on file  Occupational History   Not on file  Tobacco Use   Smoking status: Former    Current packs/day: 0.00    Types: Cigarettes    Start date: 05/21/1966    Quit date: 05/21/1996    Years since quitting: 27.2   Smokeless tobacco: Never  Vaping Use   Vaping status: Never Used  Substance and Sexual Activity   Alcohol use: No   Drug use: Never   Sexual activity: Not on file  Other Topics Concern   Not on file  Social History Narrative   Not on file   Social Drivers of Health   Financial Resource Strain: Low Risk  (05/28/2018)   Overall Financial Resource Strain (CARDIA)    Difficulty of Paying Living Expenses: Not hard at all  Food Insecurity: No Food Insecurity (02/15/2023)   Hunger Vital Sign    Worried About Running Out of Food in the Last Year: Never true    Ran  Out of Food in the Last Year: Never true  Transportation Needs: No Transportation Needs (02/15/2023)   PRAPARE - Administrator, Civil Service (Medical): No    Lack of Transportation (Non-Medical): No  Physical Activity: Not on file  Stress: Not on file  Social Connections: Not on file  Intimate Partner Violence: Unknown (02/15/2023)   Humiliation, Afraid, Rape, and Kick questionnaire    Fear of Current or Ex-Partner: No  Emotionally Abused: No    Physically Abused: Not on file    Sexually Abused: No    FAMILY HISTORY: Family History  Problem Relation Age of Onset   Hypertension Mother    Thyroid disease Mother    Alzheimer's disease Mother    Coronary artery disease Father    Pulmonary embolism Father    Congestive Heart Failure Maternal Grandmother    Hypertension Maternal Grandmother    Heart attack Maternal Grandfather    Other Maternal Grandfather        carotid disease   Dementia Paternal Grandmother    Other Paternal Grandfather 21       accident    ALLERGIES:  is allergic to sumatriptan succinate and amitriptyline.  MEDICATIONS:  Current Outpatient Medications  Medication Sig Dispense Refill   acetaminophen (TYLENOL) 650 MG CR tablet Take 1,300 mg by mouth every 8 (eight) hours as needed for pain.     acyclovir (ZOVIRAX) 400 MG tablet TAKE 1 TABLET BY MOUTH TWICE A DAY 180 tablet 2   b complex vitamins capsule Take 1 capsule by mouth daily.     Calcium Carb-Cholecalciferol (CALCIUM 600 + D PO) Take 1 tablet by mouth daily.     carvedilol (COREG) 12.5 MG tablet Take 12.5 mg by mouth 2 (two) times daily with a meal.      Cholecalciferol (VITAMIN D3) 125 MCG (5000 UT) capsule Take 5,000 Units by mouth daily.     cyanocobalamin (VITAMIN B12) 1000 MCG tablet Take 1,000 mcg by mouth daily.     ELIQUIS 5 MG TABS tablet TAKE 1 TABLET BY MOUTH TWICE A DAY 60 tablet 5   FIBER PO Take 1 capsule by mouth 2 (two) times daily.     Fiber POWD Take 1 Scoop by mouth  daily.     gabapentin (NEURONTIN) 300 MG capsule Take 300 mg by mouth 3 (three) times daily.     loratadine (CLARITIN) 10 MG tablet Take 10 mg by mouth daily.     Omega-3 Fatty Acids (FISH OIL) 1000 MG CAPS Take 1,000 mg by mouth daily.      ramipril (ALTACE) 2.5 MG tablet Take 2.5 mg by mouth daily.       simvastatin (ZOCOR) 40 MG tablet Take 40 mg by mouth at bedtime.       tiZANidine (ZANAFLEX) 4 MG tablet Take 2 mg by mouth daily as needed for muscle spasms.     traZODone (DESYREL) 50 MG tablet Take 50 mg by mouth at bedtime.      venlafaxine (EFFEXOR) 75 MG tablet Take 75 mg by mouth daily.     vitamin E 400 UNIT capsule Take 400 Units by mouth daily.     zonisamide (ZONEGRAN) 50 MG capsule Take 50-100 mg by mouth See admin instructions. Take 50mg  by mouth in the morning and 100mg  at night.     No current facility-administered medications for this visit.    REVIEW OF SYSTEMS:    10 Point review of Systems was done is negative except as noted above.   PHYSICAL EXAMINATION: .BP 124/63 (BP Location: Left Arm, Patient Position: Sitting)   Pulse 74   Temp (!) 97 F (36.1 C) (Temporal)   Resp 15   Wt 156 lb 4.8 oz (70.9 kg)   SpO2 94%   BMI 24.48 kg/m   GENERAL:alert, in no acute distress and comfortable SKIN: no acute rashes, no significant lesions EYES: conjunctiva are pink and non-injected, sclera anicteric OROPHARYNX: MMM, no exudates,  no oropharyngeal erythema or ulceration NECK: supple, no JVD LYMPH:  no palpable lymphadenopathy in the cervical, axillary or inguinal regions LUNGS: clear to auscultation b/l with normal respiratory effort HEART: regular rate & rhythm ABDOMEN:  normoactive bowel sounds , non tender, not distended. Extremity: no pedal edema PSYCH: alert & oriented x 3 with fluent speech NEURO: no focal motor/sensory deficits   LABORATORY DATA:  I have reviewed the data as listed    Latest Ref Rng & Units 08/03/2023   11:05 AM 05/07/2023    9:35 AM  03/05/2023    2:30 PM  CBC  WBC 4.0 - 10.5 K/uL 4.0  3.2  3.7   Hemoglobin 12.0 - 15.0 g/dL 29.5  62.1  30.8   Hematocrit 36.0 - 46.0 % 41.8  38.8  35.7   Platelets 150 - 400 K/uL 107  115  104       Latest Ref Rng & Units 08/03/2023   11:05 AM 05/07/2023    9:35 AM 03/05/2023    2:30 PM  CMP  Glucose 70 - 99 mg/dL 87  91  657   BUN 8 - 23 mg/dL 13  20  17    Creatinine 0.44 - 1.00 mg/dL 8.46  9.62  9.52   Sodium 135 - 145 mmol/L 141  139  138   Potassium 3.5 - 5.1 mmol/L 4.3  3.8  3.6   Chloride 98 - 111 mmol/L 104  107  107   CO2 22 - 32 mmol/L 31  26  26    Calcium 8.9 - 10.3 mg/dL 84.1  9.3  9.1   Total Protein 6.5 - 8.1 g/dL 6.9  6.6  5.9   Total Bilirubin 0.0 - 1.2 mg/dL 0.7  0.5  0.4   Alkaline Phos 38 - 126 U/L 82  88  67   AST 15 - 41 U/L 24  20  16    ALT 0 - 44 U/L 19  17  13     Lab Results  Component Value Date   LDH 180 08/03/2023   Molecular Pathology 10/21/2022:    Surgical Pathology 10/04/22: A. LYMPH NODE, RIGHT LEVEL 2 DEEP CERVICAL, EXCISION:  -Diffuse large B-cell lymphoma  -See comment COMMENT: The sections show diffuse effacement of the lymph nodal architecture primarily by a population of large lymphoid cells characterized by vesicular chromatin and small nucleoli associated with apoptosis and brisk mitosis.  In some areas, the large atypical lymphoid proliferation extends into the perinodal adipose tissue.  In this background, there are scattered variably sized and somewhat disrupted aggregates of primarily small lymphoid cells characterized by high nuclear cytoplasmic ratio, angulated nuclear contours and small to inconspicuous nucleoli. Flow cytometric analysis was performed [facility] [phone]) and shows a monoclonal, lambda restricted B-cell population expressing CD10.  In addition, immunohistochemical stains for CD3, CD5, CD10, CD20, PAX5, BCL6, Bcl-2, Ki-67, CD30, CD138, CD21, EBV in addition to in situ hybridization for kappa and lambda were  performed with appropriate controls.  The large lymphoid cells are positive for CD20, PAX5, CD10, BCL6, Bcl-2, and partially for cytoplasmic lambda.  No significant staining is seen with EBV, CD30, CD138 or cytoplasmic kappa.  Ki67 shows variably increased expression (more than 50% in some areas). CD21 highlights scattered somewhat disrupted follicular dendritic networks. The lymphoid aggregates of primarily small lymphoid cells previously described show positivity for B-cell markers CD20 and PAX5 in addition to CD10 and Bcl-2.  There is an admixed variable T-cell component in the background as seen with CD3  and CD5 and there is no apparent co-expression of CD5 in B-cell areas.  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   RADIOGRAPHIC STUDIES: I have personally reviewed the radiological images as listed and agreed with the findings in the report. [facility] P[facility] INCLINIC DEVICE CHECK Result Date: 07/27/2023 Full device interrogation not performed. Pt AS/VS 100%.  No episodes noted. Pt RRT.Ancil Boozer, BSN, RN   NM PET 09/04/2022: IMPRESSION: 1. There is a large tracer avid mass centered within the left base of tongue and left lingual region which extends into the hypopharyngeal region ventral to the epiglottis. Imaging findings are compatible with a primary head and neck malignancy. 2. A smaller focus of increased uptake localizes to the right pharyngeal tonsil region. Indeterminate. 3. Bilateral tracer avid cervical and supraclavicular lymph nodes compatible with nodal metastasis. 4. There is a 7 mm tracer avid nodule within the anteromedial left upper lobe which is suspicious for pulmonary metastasis. 5. There is increased uptake identified along the long axis of the posterior right seventh rib. No corresponding lytic or sclerotic changes  identified on the CT images. Cannot exclude bone metastases. 6. Multiple prominent retroperitoneal and mesenteric lymph nodes are identified which exhibit mild tracer uptake. This is a nonspecific finding and may reflect reactive adenopathy. Metastatic adenopathy cannot be excluded. Attention on future surveillance imaging is advised. 7.  Aortic Atherosclerosis (ICD10-I70.0).   ASSESSMENT & PLAN:   78 y.o.  female with:   1. 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 2. 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 RUL lung nodule on PET 03/09/2023 -- referred to pulmonology. Has see seen Dr Icard/Dr Delton Coombes and is scheduled for CT chest superD and FOB for Bx. Concerning for possible primary lung malignancy though lymphoma is also on the differential though less likely. PLAN:  -Discussed lab results on 08/03/23 in detail with patient. CBC showed WBC of 4.0K, hemoglobin of 14.3, and platelets of 107K. -CBC shows mild thrombocytopenia, but is otherwise stable -CMP normal -LDH normal -discussed that the nodule may be reasonable to biopsy the area through a bronchoscope after defining risks of the procedure. Discussed that if scans show that there could be a primary lung tumor and not lymphoma, it would not be a uncommon option to consider radiation therapy with clinical suspician based on scan, though a biopsy would be an ideal next step.  -proceed with biopsy with Dr. Delton Coombes on 08/07/2023 -unlikely for there to be a role for major surgery -discussed that there may be role for radiation therapy -She inquires whether it may be possible to receive radiation therapy in [place], [place] if needed due to convenience -if needed, she shall have a  detailed discussion with radiation oncologist regarding whether it may be possible to receive radiation therapy in a different location if she chooses to -educated patient that it is possible to have a low amount of nodular infiltrate related to COVID-19 infection. Also educated patient that growthfactor injections can cause growth of inflammation spots by stimulating WBCs.  -she shall proceed with her rpt CT scan today as ordered by Dr Delton Coombes -answered all of patient's questions in detail -will set up phone visit to discuss  her biopsy results.   FOLLOW-UP: Phone visit with Dr Candise Che in 2 weeks to f/u Lung nodule Bx results  The total time spent in the appointment was 32 minutes* .  All of the patient's questions were answered with apparent satisfaction. The patient knows to call the clinic with any problems, questions or concerns.   Wyvonnia Lora MD MS AAHIVMS [facility] Hematology/Oncology Physician [facility]  .*Total Encounter Time as defined by the Centers for Medicare and Medicaid Services includes, in addition to the face-to-face time of a patient visit (documented in the note above) non-face-to-face time: obtaining and reviewing outside history, ordering and reviewing medications, tests or procedures, care coordination (communications with other health care professionals or caregivers) and documentation in the medical record.    I,Mitra Faeizi,acting as a Neurosurgeon for Wyvonnia Lora, MD.,have documented all relevant documentation on the behalf of Wyvonnia Lora, MD,as directed by  Wyvonnia Lora, MD while in the presence of Wyvonnia Lora, MD.  .I have reviewed the above documentation for accuracy and completeness, and I agree with the above. Johney Maine MD

## 2023-08-03 NOTE — Progress Notes (Signed)
PERIOPERATIVE PRESCRIPTION FOR IMPLANTED CARDIAC DEVICE PROGRAMMING  Patient Information: Name:  Rachel Bates  DOB:  22-Jul-1945  MRN:  [id number]    Planned Procedure:  ROBOTIC ASSISTED NAVIGATIONAL BRONCHOSCOPY  Surgeon:  Dr. Levy Pupa  Date of Procedure:  08/07/23  Cautery will be used.  Position during surgery:  Supine   Please send documentation back to:  [facility] (Fax # [phone])  Device Information:  Clinic EP Physician:  Lewayne Bunting, MD   Device Type:  Defibrillator Manufacturer and Phone #:  Medtronic: [id number] Pacemaker Dependent?:  No. Date of Last Device Check:  07/27/2023 Normal Device Function?:  Yes.    Electrophysiologist's Recommendations:  Have magnet available. Provide continuous ECG monitoring when magnet is used or reprogramming is to be performed.  Procedure may interfere with device function.  Magnet should be placed over device during procedure.  Per Device Clinic Standing Orders, Lenor Coffin, RN  9:34 AM 08/03/2023

## 2023-08-03 NOTE — Progress Notes (Signed)
PCP - Eunice Blase, PA Cardiologist - Marinus Maw, MD  PPM/ICD - ICD Device Orders - Sent  Rep Notified - Shari Prows emailed  Chest x-ray - 02/14/23 EKG - 02/14/23 ECHO - 02/15/23 Stress and Cath done many years ago in [place]  Blood Thinner Instructions: Eliquis last dose Sat 08/04/23  Anesthesia review: Y  Patient verbally denies any shortness of breath, fever, cough and chest pain during phone call   -------------  SDW INSTRUCTIONS given:  Your procedure is scheduled on Tuesday,  08/07/23.  Report to [facility] Main Entrance "A" at 0645 A.M., and check in at the Admitting office.  Call this number if you have problems the morning of surgery:  [phone]   Remember:  Do not eat or drink after midnight the night before your surgery   Take these medicines the morning of surgery with A SIP OF WATER  acyclovir (ZOVIRAX)  carvedilol (COREG)  gabapentin (NEURONTIN)  loratadine (CLARITIN)  venlafaxine (EFFEXOR)  zonisamide (ZONEGRAN)  acetaminophen (TYLENOL)-if needed tiZANidine (ZANAFLEX)-if needed    As of today, STOP taking any Aspirin (unless otherwise instructed by your surgeon) Aleve, Naproxen, Ibuprofen, Motrin, Advil, Goody's, BC's, all herbal medications, fish oil, and all vitamins.                      Do not wear jewelry, make up, or nail polish            Do not wear lotions, powders, perfumes/colognes, or deodorant.            Do not shave 48 hours prior to surgery.  Men may shave face and neck.            Do not bring valuables to the hospital.            [facility] is not responsible for any belongings or valuables.  Do NOT Smoke (Tobacco/Vaping) 24 hours prior to your procedure If you use a CPAP at night, you may bring all equipment for your overnight stay.   Contacts, glasses, dentures or bridgework may not be worn into surgery.      For patients admitted to the hospital, discharge time will be determined by your treatment team.   Patients discharged  the day of surgery will not be allowed to drive home, and someone needs to stay with them for 24 hours.    Special instructions:   [facility]- Preparing For Surgery  Before surgery, you can play an important role. Because skin is not sterile, your skin needs to be as free of germs as possible. You can reduce the number of germs on your skin by washing with CHG (chlorahexidine gluconate) Soap before surgery.  CHG is an antiseptic cleaner which kills germs and bonds with the skin to continue killing germs even after washing.    Oral Hygiene is also important to reduce your risk of infection.  Remember - BRUSH YOUR TEETH THE MORNING OF SURGERY WITH YOUR REGULAR TOOTHPASTE  Please do not use if you have an allergy to CHG or antibacterial soaps. If your skin becomes reddened/irritated stop using the CHG.  Do not shave (including legs and underarms) for at least 48 hours prior to first CHG shower. It is OK to shave your face.  Please follow these instructions carefully.   Shower the NIGHT BEFORE SURGERY and the MORNING OF SURGERY with DIAL Soap.   Pat yourself dry with a CLEAN TOWEL.  Wear CLEAN PAJAMAS to bed the night  before surgery  Place CLEAN SHEETS on your bed the night of your first shower and DO NOT SLEEP WITH PETS.   Day of Surgery: Please shower morning of surgery  Wear Clean/Comfortable clothing the morning of surgery Do not apply any deodorants/lotions.   Remember to brush your teeth WITH YOUR REGULAR TOOTHPASTE.   Questions were answered. Patient verbalized understanding of instructions.

## 2023-08-04 ENCOUNTER — Telehealth: Payer: Self-pay | Admitting: Hematology

## 2023-08-04 NOTE — Telephone Encounter (Signed)
Spoke with patient confirming upcoming appointment

## 2023-08-06 NOTE — Progress Notes (Signed)
Anesthesia Chart Review: Same day workup  78 year old female follows with cardiology for history of CAD s/p DES 1995, PE (2012), HTN, ICM s/p Medtronic PPM (original placement 2007, generator change 2014), paroxysmal A-fib, V. tach.  Last seen by Dr. Ladona Ridgel 07/27/2023.  Per note, "Since her last visit, she has developed lymphoma and has undergone chemotherapy. She has had 8 cycles and a repeat echo done a few weeks ago showed her EF is 50%. She has never had any ICD therapies and paces in the atrium 5% of the time...1.ICM - she has a repeat echo of 50%. I have discussed the treatment options and recommend watchful waiting.  2. ICD - she is almost at [facility]. I discussed the treatment options and we will tentatively plan to hold off on ICD gen change as her EF is improved and she has never had any tachy therapies. She will undergo removal of the device and capping of the leads. 3. Sinus brady - she is pacing 5%. I have reprogrammed her to VVI 30/min and she has not had any pacing."  Follows with oncologist Dr. Candise Che for history of diffuse large B-cell lymphoma, last chemo infusion 02/13/2023.  Initially presented as a right-sided sore throat with oropharyngeal mass noted on nasolaryngoscopy and bulky cervical adenopathy bilaterally.  Recent CT of the chest 04/30/2023 showed slight enlargement of previously demonstrated right upper lobe perivascular nodule; concerning for possible recurrent lymphoma or primary lung cancer.  She was referred to pulmonology for bronchoscopy and biopsy.  History of OSA, not on CPAP.   Patient reports last dose Eliquis 08/04/2023  GlideScope was used for intubation 10/04/2022.   CMP and CBC from 08/03/2023 reviewed, mild thrombocytopenia platelets 107, otherwise WNL.   EKG 02/14/2023: Sinus rhythm.  Rate 86. Inferior infarct, old. Lateral leads are also involved more pronounced t-wave inversion inV5/V6 from prior EKG   Perioperative prescription for implanted cardiac device  programming 08/03/2023: Device Information:   Clinic EP Physician:  Lewayne Bunting, MD    Device Type:  Defibrillator Manufacturer and Phone #:  Medtronic: [phone] Pacemaker Dependent?:  No. Date of Last Device Check:  07/27/2023     Normal Device Function?:  Yes.     Electrophysiologist's Recommendations:   Have magnet available. Provide continuous ECG monitoring when magnet is used or reprogramming is to be performed.  Procedure may interfere with device function.  Magnet should be placed over device during procedure.  CT chest 04/30/2023: IMPRESSION: 1. The previously demonstrated right upper lobe perivascular nodule appears slightly larger, with an enlarging adjacent nodule laterally. These nodules were hypermetabolic on PET-CT and remain suspicious for neoplasm, possibly recurrent lymphoma or primary lung cancer. Consider tissue sampling. 2. Additional clustered nodularity peripherally in the left upper lobe is unchanged and favored to be inflammatory. 3. No other new or enlarging pulmonary nodules. 4. No thoracic adenopathy. 5.  Aortic Atherosclerosis (ICD10-I70.0).   TTE 02/15/2023: 1. Left ventricular ejection fraction, by estimation, is 50%. The left  ventricle has low normal function. The left ventricle demonstrates  regional wall motion abnormalities unchanged in location from prior study  on side by side review. Left ventricular  diastolic parameters are consistent with Grade I diastolic dysfunction  (impaired relaxation).   2. Right ventricular systolic function is normal. The right ventricular  size is normal. There is mildly elevated pulmonary artery systolic  pressure. The estimated right ventricular systolic pressure is 42.9 mmHg.   3. The mitral valve is grossly normal. Trivial mitral valve  regurgitation. No evidence  of mitral stenosis.   4. The aortic valve is normal in structure. Aortic valve regurgitation is  not visualized. No aortic stenosis is  present.   5. The inferior vena cava is dilated in size with <50% respiratory  variability, suggesting right atrial pressure of 15 mmHg.     Zannie Cove [facility] Short Stay Center/Anesthesiology Phone [phone] 08/06/2023 10:58 AM

## 2023-08-06 NOTE — Anesthesia Preprocedure Evaluation (Signed)
Anesthesia Evaluation  Patient identified by MRN, date of birth, ID band Patient awake    Reviewed: Allergy & Precautions, NPO status , Patient's Chart, lab work & pertinent test results, reviewed documented beta blocker date and time   History of Anesthesia Complications Negative for: history of anesthetic complications  Airway Mallampati: III  TM Distance: >3 FB Neck ROM: Limited  Mouth opening: Limited Mouth Opening  Dental  (+) Dental Advisory Given, Edentulous Upper, Edentulous Lower   Pulmonary neg shortness of breath, sleep apnea , neg COPD, neg recent URI, former smoker   breath sounds clear to auscultation       Cardiovascular hypertension, Pt. on medications and Pt. on home beta blockers + CAD, + Past MI, + Cardiac Stents and +CHF  + dysrhythmias Atrial Fibrillation and Ventricular Tachycardia + Cardiac Defibrillator  Rhythm:Regular     Neuro/Psych  Headaches PSYCHIATRIC DISORDERS Anxiety Depression       GI/Hepatic negative GI ROS, Neg liver ROS,,,  Endo/Other  negative endocrine ROS    Renal/GU negative Renal ROS     Musculoskeletal  (+) Arthritis ,    Abdominal   Peds  Hematology  (+) Blood dyscrasia, anemia   Anesthesia Other Findings Previous Glide intubation Induction Type: IV induction Ventilation: Mask ventilation without difficulty Laryngoscope Size: Glidescope and 3 Grade View: Grade II Tube type: Oral Tube size: 6.5 mm Number of attempts: 1 Airway Equipment and Method: Stylet and Oral airway Placement Confirmation: ETT inserted through vocal cords under direct vision, positive ETCO2 and breath sounds checked- equal and bilateral Secured at: 20 cm   Reproductive/Obstetrics                             Anesthesia Physical Anesthesia Plan  ASA: 3  Anesthesia Plan: General   Post-op Pain Management: Minimal or no pain anticipated   Induction: Intravenous  PONV  Risk Score and Plan: 3 and Ondansetron and Dexamethasone  Airway Management Planned: Oral ETT  Additional Equipment: None  Intra-op Plan:   Post-operative Plan: Extubation in OR  Informed Consent: I have reviewed the patients History and Physical, chart, labs and discussed the procedure including the risks, benefits and alternatives for the proposed anesthesia with the patient or authorized representative who has indicated his/her understanding and acceptance.     Dental advisory given  Plan Discussed with: CRNA and Anesthesiologist  Anesthesia Plan Comments: (PAT note by Antionette Poles, PA-C: 78 year old female follows with cardiology for history of CAD s/p DES 1995, PE (2012), HTN, ICM s/p Medtronic PPM (original placement 2007, generator change 2014), paroxysmal A-fib, V. tach.  Last seen by Dr. Ladona Ridgel 07/27/2023.  Per note, "Since her last visit, she has developed lymphoma and has undergone chemotherapy. She has had 8 cycles and a repeat echo done a few weeks ago showed her EF is 50%. She has never had any ICD therapies and paces in the atrium 5% of the time...1.ICM - she has a repeat echo of 50%. I have discussed the treatment options and recommend watchful waiting.  2. ICD - she is almost at [facility]. I discussed the treatment options and we will tentatively plan to hold off on ICD gen change as her EF is improved and she has never had any tachy therapies. She will undergo removal of the device and capping of the leads. 3. Sinus brady - she is pacing 5%. I have reprogrammed her to VVI 30/min and she has not had any  pacing."  Follows with oncologist Dr. Candise Che for history of diffuse large B-cell lymphoma, last chemo infusion 02/13/2023.  Initially presented as a right-sided sore throat with oropharyngeal mass noted on nasolaryngoscopy and bulky cervical adenopathy bilaterally.  Recent CT of the chest 04/30/2023 showed slight enlargement of previously demonstrated right upper lobe perivascular nodule;  concerning for possible recurrent lymphoma or primary lung cancer.  She was referred to pulmonology for bronchoscopy and biopsy.  History of OSA, not on CPAP.  Patient reports last dose Eliquis 08/04/2023  GlideScope was used for intubation 10/04/2022.  CMP and CBC from 08/03/2023 reviewed, mild thrombocytopenia platelets 107, otherwise WNL.  EKG 02/14/2023: Sinus rhythm.  Rate 86. Inferior infarct, old. Lateral leads are also involved more pronounced t-wave inversion inV5/V6 from prior EKG  Perioperative prescription for implanted cardiac device programming 08/03/2023: Device Information:  Clinic EP Physician:  Lewayne Bunting, MD   Device Type:  Defibrillator Manufacturer and Phone #:  Medtronic: [phone] Pacemaker Dependent?:  No. Date of Last Device Check:  07/27/2023     Normal Device Function?:  Yes.    Electrophysiologist's Recommendations:   Have magnet available.  Provide continuous ECG monitoring when magnet is used or reprogramming is to be performed.   Procedure may interfere with device function.  Magnet should be placed over device during procedure.  CT chest 04/30/2023: IMPRESSION: 1. The previously demonstrated right upper lobe perivascular nodule appears slightly larger, with an enlarging adjacent nodule laterally. These nodules were hypermetabolic on PET-CT and remain suspicious for neoplasm, possibly recurrent lymphoma or primary lung cancer. Consider tissue sampling. 2. Additional clustered nodularity peripherally in the left upper lobe is unchanged and favored to be inflammatory. 3. No other new or enlarging pulmonary nodules. 4. No thoracic adenopathy. 5.  Aortic Atherosclerosis (ICD10-I70.0).  TTE 02/15/2023: 1. Left ventricular ejection fraction, by estimation, is 50%. The left  ventricle has low normal function. The left ventricle demonstrates  regional wall motion abnormalities unchanged in location from prior study  on side by side  review. Left ventricular  diastolic parameters are consistent with Grade I diastolic dysfunction  (impaired relaxation).  2. Right ventricular systolic function is normal. The right ventricular  size is normal. There is mildly elevated pulmonary artery systolic  pressure. The estimated right ventricular systolic pressure is 42.9 mmHg.  3. The mitral valve is grossly normal. Trivial mitral valve  regurgitation. No evidence of mitral stenosis.  4. The aortic valve is normal in structure. Aortic valve regurgitation is  not visualized. No aortic stenosis is present.  5. The inferior vena cava is dilated in size with <50% respiratory  variability, suggesting right atrial pressure of 15 mmHg.    )        Anesthesia Quick Evaluation

## 2023-08-07 ENCOUNTER — Encounter (HOSPITAL_COMMUNITY): Payer: Self-pay | Admitting: Emergency Medicine

## 2023-08-07 ENCOUNTER — Ambulatory Visit (HOSPITAL_BASED_OUTPATIENT_CLINIC_OR_DEPARTMENT_OTHER): Payer: Self-pay | Admitting: Physician Assistant

## 2023-08-07 ENCOUNTER — Ambulatory Visit (HOSPITAL_COMMUNITY)
Admission: RE | Admit: 2023-08-07 | Discharge: 2023-08-07 | Disposition: A | Discharge status: Home / Self Care | Payer: Medicare HMO | Source: Ambulatory Visit | Attending: Emergency Medicine | Admitting: Emergency Medicine

## 2023-08-07 ENCOUNTER — Ambulatory Visit (HOSPITAL_COMMUNITY): Payer: Medicare HMO

## 2023-08-07 ENCOUNTER — Other Ambulatory Visit: Payer: Medicare HMO

## 2023-08-07 ENCOUNTER — Encounter (HOSPITAL_COMMUNITY)
Admission: RE | Disposition: A | Discharge status: Home / Self Care | Payer: Self-pay | Source: Ambulatory Visit | Attending: Emergency Medicine

## 2023-08-07 ENCOUNTER — Other Ambulatory Visit: Payer: Self-pay

## 2023-08-07 ENCOUNTER — Ambulatory Visit (HOSPITAL_COMMUNITY): Payer: Self-pay | Admitting: Physician Assistant

## 2023-08-07 DIAGNOSIS — Z8249 Family history of ischemic heart disease and other diseases of the circulatory system: Secondary | ICD-10-CM | POA: Insufficient documentation

## 2023-08-07 DIAGNOSIS — I700 Atherosclerosis of aorta: Secondary | ICD-10-CM | POA: Diagnosis not present

## 2023-08-07 DIAGNOSIS — I252 Old myocardial infarction: Secondary | ICD-10-CM | POA: Diagnosis not present

## 2023-08-07 DIAGNOSIS — Z48813 Encounter for surgical aftercare following surgery on the respiratory system: Secondary | ICD-10-CM | POA: Diagnosis not present

## 2023-08-07 DIAGNOSIS — R918 Other nonspecific abnormal finding of lung field: Secondary | ICD-10-CM | POA: Diagnosis present

## 2023-08-07 DIAGNOSIS — I4891 Unspecified atrial fibrillation: Secondary | ICD-10-CM | POA: Diagnosis not present

## 2023-08-07 DIAGNOSIS — C3411 Malignant neoplasm of upper lobe, right bronchus or lung: Secondary | ICD-10-CM | POA: Diagnosis not present

## 2023-08-07 DIAGNOSIS — F418 Other specified anxiety disorders: Secondary | ICD-10-CM

## 2023-08-07 DIAGNOSIS — I2510 Atherosclerotic heart disease of native coronary artery without angina pectoris: Secondary | ICD-10-CM | POA: Diagnosis not present

## 2023-08-07 DIAGNOSIS — R911 Solitary pulmonary nodule: Secondary | ICD-10-CM | POA: Diagnosis not present

## 2023-08-07 DIAGNOSIS — J984 Other disorders of lung: Secondary | ICD-10-CM | POA: Diagnosis not present

## 2023-08-07 DIAGNOSIS — Z955 Presence of coronary angioplasty implant and graft: Secondary | ICD-10-CM | POA: Diagnosis not present

## 2023-08-07 DIAGNOSIS — R846 Abnormal cytological findings in specimens from respiratory organs and thorax: Secondary | ICD-10-CM | POA: Diagnosis not present

## 2023-08-07 DIAGNOSIS — I110 Hypertensive heart disease with heart failure: Secondary | ICD-10-CM | POA: Insufficient documentation

## 2023-08-07 DIAGNOSIS — Z87891 Personal history of nicotine dependence: Secondary | ICD-10-CM | POA: Insufficient documentation

## 2023-08-07 DIAGNOSIS — I480 Paroxysmal atrial fibrillation: Secondary | ICD-10-CM

## 2023-08-07 DIAGNOSIS — I509 Heart failure, unspecified: Secondary | ICD-10-CM | POA: Diagnosis not present

## 2023-08-07 HISTORY — PX: BRONCHIAL BRUSHINGS: SHX5108

## 2023-08-07 HISTORY — DX: Pneumonia, unspecified organism: J18.9

## 2023-08-07 HISTORY — PX: VIDEO BRONCHOSCOPY WITH RADIAL ENDOBRONCHIAL ULTRASOUND: SHX6849

## 2023-08-07 HISTORY — PX: BRONCHIAL NEEDLE ASPIRATION BIOPSY: SHX5106

## 2023-08-07 HISTORY — PX: FIDUCIAL MARKER PLACEMENT: SHX6858

## 2023-08-07 HISTORY — PX: BRONCHIAL BIOPSY: SHX5109

## 2023-08-07 SURGERY — ROBOTIC ASSISTED NAVIGATIONAL BRONCHOSCOPY
Anesthesia: General

## 2023-08-07 MED ORDER — CHLORHEXIDINE GLUCONATE 0.12 % MT SOLN
15.0000 mL | Freq: Once | OROMUCOSAL | Status: AC
  Administered 2023-08-07: 15 mL via OROMUCOSAL
  Filled 2023-08-07: qty 15

## 2023-08-07 MED ORDER — LIDOCAINE 2% (20 MG/ML) 5 ML SYRINGE
INTRAMUSCULAR | Status: DC | PRN
  Administered 2023-08-07: 100 mg via INTRAVENOUS

## 2023-08-07 MED ORDER — LACTATED RINGERS IV SOLN: INTRAVENOUS | Status: DC

## 2023-08-07 MED ORDER — FENTANYL CITRATE (PF) 100 MCG/2ML IJ SOLN: 25.0000 ug | INTRAMUSCULAR | Status: DC | PRN

## 2023-08-07 MED ORDER — FENTANYL CITRATE (PF) 250 MCG/5ML IJ SOLN
INTRAMUSCULAR | Status: DC | PRN
  Administered 2023-08-07 (×2): 50 ug via INTRAVENOUS

## 2023-08-07 MED ORDER — ONDANSETRON HCL 4 MG/2ML IJ SOLN
INTRAMUSCULAR | Status: DC | PRN
  Administered 2023-08-07: 4 mg via INTRAVENOUS

## 2023-08-07 MED ORDER — APIXABAN 5 MG PO TABS: 5.0000 mg | ORAL_TABLET | Freq: Two times a day (BID) | ORAL | Status: DC

## 2023-08-07 MED ORDER — ACETAMINOPHEN 325 MG PO TABS: 325.0000 mg | ORAL_TABLET | ORAL | Status: DC | PRN

## 2023-08-07 MED ORDER — ROCURONIUM BROMIDE 10 MG/ML (PF) SYRINGE
PREFILLED_SYRINGE | INTRAVENOUS | Status: DC | PRN
  Administered 2023-08-07: 50 mg via INTRAVENOUS

## 2023-08-07 MED ORDER — OXYCODONE HCL 5 MG/5ML PO SOLN: 5.0000 mg | Freq: Once | ORAL | Status: DC | PRN

## 2023-08-07 MED ORDER — FENTANYL CITRATE (PF) 100 MCG/2ML IJ SOLN
INTRAMUSCULAR | Status: AC
  Filled 2023-08-07: qty 2

## 2023-08-07 MED ORDER — ONDANSETRON HCL 4 MG/2ML IJ SOLN
4.0000 mg | Freq: Once | INTRAMUSCULAR | Status: DC | PRN
Start: 2023-08-07 — End: 2023-08-07

## 2023-08-07 MED ORDER — SUGAMMADEX SODIUM 200 MG/2ML IV SOLN
INTRAVENOUS | Status: DC | PRN
  Administered 2023-08-07: 200 mg via INTRAVENOUS

## 2023-08-07 MED ORDER — OXYCODONE HCL 5 MG PO TABS: 5.0000 mg | ORAL_TABLET | Freq: Once | ORAL | Status: DC | PRN

## 2023-08-07 MED ORDER — ACETAMINOPHEN 160 MG/5ML PO SOLN: 325.0000 mg | ORAL | Status: DC | PRN

## 2023-08-07 MED ORDER — MEPERIDINE HCL 25 MG/ML IJ SOLN: 6.2500 mg | INTRAMUSCULAR | Status: DC | PRN

## 2023-08-07 MED ORDER — PROPOFOL 10 MG/ML IV BOLUS
INTRAVENOUS | Status: DC | PRN
  Administered 2023-08-07: 100 mg via INTRAVENOUS

## 2023-08-07 MED ORDER — PROPOFOL 500 MG/50ML IV EMUL
INTRAVENOUS | Status: DC | PRN
  Administered 2023-08-07: 150 ug/kg/min via INTRAVENOUS

## 2023-08-07 SURGICAL SUPPLY — 1 items: superlock fiducial ×2 IMPLANT

## 2023-08-07 NOTE — Interval H&P Note (Signed)
History and Physical Interval Note:  08/07/2023 7:51 AM  Rachel Bates  has presented today for surgery, with the diagnosis of lung nodule.  The various methods of treatment have been discussed with the patient and family. After consideration of risks, benefits and other options for treatment, the patient has consented to  Procedure(s): ROBOTIC ASSISTED NAVIGATIONAL BRONCHOSCOPY (N/A) as a surgical intervention.  The patient's history has been reviewed, patient examined, no change in status, stable for surgery.  I have reviewed the patient's chart and labs.  Questions were answered to the patient's satisfaction.     Leslye Peer

## 2023-08-07 NOTE — Discharge Instructions (Addendum)
Flexible Bronchoscopy, Care After This sheet gives you information about how to care for yourself after your test. Your doctor may also give you more specific instructions. If you have problems or questions, contact your doctor. Follow these instructions at home: Eating and drinking When your numbness is gone and your cough and gag reflexes have come back, you may: Eat only soft foods. Slowly drink liquids. When you get home after the test, go back to your normal diet. Driving Do not drive for 24 hours if you were given a medicine to help you relax (sedative). Do not drive or use heavy machinery while taking prescription pain medicine. General instructions  Take over-the-counter and prescription medicines only as told by your doctor. Return to your normal activities as told. Ask what activities are safe for you. Do not use any products that have nicotine or tobacco in them. This includes cigarettes and e-cigarettes. If you need help quitting, ask your doctor. Keep all follow-up visits as told by your doctor. This is important. It is very important if you had a tissue sample (biopsy) taken. Get help right away if: You have shortness of breath that gets worse. You get light-headed. You feel like you are going to pass out (faint). You have chest pain. You cough up: More than a little blood. More blood than before. Summary Do not eat or drink anything (not even water) for 2 hours after your test, or until your numbing medicine wears off. Do not use cigarettes. Do not use e-cigarettes. Get help right away if you have chest pain.  Please call our office for any questions or concerns.  [phone]. Okay to restart your Eliquis on 08/08/2023  This information is not intended to replace advice given to you by your health care provider. Make sure you discuss any questions you have with your health care provider. Document Released: 04/23/2009 Document Revised: 06/08/2017 Document Reviewed:  07/14/2016 Elsevier Patient Education  2020 [employer].

## 2023-08-07 NOTE — Anesthesia Procedure Notes (Addendum)
Procedure Name: Intubation Date/Time: 08/07/2023 9:19 AM  Performed by: Allyn Kenner, CRNAPre-anesthesia Checklist: Patient identified, Emergency Drugs available, Suction available and Patient being monitored Patient Re-evaluated:Patient Re-evaluated prior to induction Oxygen Delivery Method: Circle System Utilized Preoxygenation: Pre-oxygenation with 100% oxygen Induction Type: IV induction Ventilation: Mask ventilation without difficulty Laryngoscope Size: Glidescope and 3 Grade View: Grade I Tube type: Oral Tube size: 8.5 mm Number of attempts: 1 Airway Equipment and Method: Stylet and Oral airway Placement Confirmation: ETT inserted through vocal cords under direct vision, positive ETCO2 and breath sounds checked- equal and bilateral Secured at: 22 cm Tube secured with: Tape Dental Injury: Teeth and Oropharynx as per pre-operative assessment

## 2023-08-07 NOTE — Transfer of Care (Signed)
Immediate Anesthesia Transfer of Care Note  Patient: Rachel Bates  Procedure(s) Performed: ROBOTIC ASSISTED NAVIGATIONAL BRONCHOSCOPY VIDEO BRONCHOSCOPY WITH RADIAL ENDOBRONCHIAL ULTRASOUND BRONCHIAL BRUSHINGS BRONCHIAL NEEDLE ASPIRATION BIOPSIES BRONCHIAL BIOPSIES FIDUCIAL MARKER PLACEMENT  Patient Location: PACU  Anesthesia Type:General  Level of Consciousness: awake, alert , and oriented  Airway & Oxygen Therapy: Patient Spontanous Breathing and Patient connected to face mask oxygen  Post-op Assessment: Report given to RN and Post -op Vital signs reviewed and stable  Post vital signs: Reviewed and stable  Last Vitals:  Vitals Value Taken Time  BP 125/72 08/07/23 1008  Temp    Pulse 75 08/07/23 1012  Resp 13 08/07/23 1012  SpO2 98 % 08/07/23 1012  Vitals shown include unfiled device data.  Last Pain:  Vitals:   08/07/23 0716  TempSrc:   PainSc: 0-No pain         Complications: No notable events documented.

## 2023-08-07 NOTE — Op Note (Signed)
Video Bronchoscopy with Robotic Assisted Bronchoscopic Navigation   Date of Operation: 08/07/2023   Pre-op Diagnosis: Right upper lobe nodule  Post-op Diagnosis: Same  Surgeon: Levy Pupa  Assistants: None  Anesthesia: General endotracheal anesthesia  Operation: Flexible video fiberoptic bronchoscopy with robotic assistance and biopsies.  Estimated Blood Loss: Minimal  Complications: None  Indications and History: Rachel Bates is a 78 y.o. female with history of former tobacco use, CAD with an AICD in place, pulmonary embolism.  She has a slowly enlarging right upper lobe pulmonary nodule with moderate hypermetabolism on PET scan.  Recommendation made to achieve a tissue diagnosis via robotic assisted navigational bronchoscopy. The risks, benefits, complications, treatment options and expected outcomes were discussed with the patient.  The possibilities of pneumothorax, pneumonia, reaction to medication, pulmonary aspiration, perforation of a viscus, bleeding, failure to diagnose a condition and creating a complication requiring transfusion or operation were discussed with the patient who freely signed the consent.    Description of Procedure: The patient was seen in the Preoperative Area, was examined and was deemed appropriate to proceed.  The patient was taken to [facility] endoscopy room 3, identified as Alfonse Alpers and the procedure verified as Flexible Video Fiberoptic Bronchoscopy.  A Time Out was held and the above information confirmed.   Prior to the date of the procedure a high-resolution CT scan of the chest was performed. Utilizing ION software program a virtual tracheobronchial tree was generated to allow the creation of distinct navigation pathways to the patient's parenchymal abnormalities. After being taken to the operating room general anesthesia was initiated and the patient  was orally intubated. The video fiberoptic bronchoscope was introduced via the endotracheal tube  and a general inspection was performed which showed normal right and left lung anatomy. Aspiration of the bilateral mainstems was completed to remove any remaining secretions. Robotic catheter inserted into patient's endotracheal tube.   Target #1 right upper lobe nodule: The distinct navigation pathways prepared prior to this procedure were then utilized to navigate to patient's lesion identified on CT scan. The robotic catheter was secured into place and the vision probe was withdrawn.  Lesion location was approximated using fluoroscopy and radial endobronchial ultrasound for peripheral targeting. Under fluoroscopic guidance transbronchial needle brushings, transbronchial needle biopsies, and transbronchial forceps biopsies were performed to be sent for cytology and pathology.  Under fluoroscopic guidance a single fiducial marker was placed adjacent to the nodule  At the end of the procedure a general airway inspection was performed and there was no evidence of active bleeding. The bronchoscope was removed.  The patient tolerated the procedure well. There was no significant blood loss and there were no obvious complications. A post-procedural chest x-ray is pending.  Samples Target #1: 1. Transbronchial needle brushings from right upper lobe nodule 2. Transbronchial Wang needle biopsies from right upper lobe nodule 3. Transbronchial forceps biopsies from right upper lobe nodule  Plans:  The patient will be discharged from the PACU to home when recovered from anesthesia and after chest x-ray is reviewed. We will review the cytology, pathology and microbiology results with the patient when they become available. Outpatient followup will be with Saralyn Pilar, NP.   Levy Pupa, MD, PhD 08/07/2023, 10:01 AM [facility] [phone] or if no answer before 7:00PM call [phone] For any issues after 7:00PM please call eLink [phone]

## 2023-08-07 NOTE — Anesthesia Postprocedure Evaluation (Signed)
Anesthesia Post Note  Patient: Rachel Bates  Procedure(s) Performed: ROBOTIC ASSISTED NAVIGATIONAL BRONCHOSCOPY VIDEO BRONCHOSCOPY WITH RADIAL ENDOBRONCHIAL ULTRASOUND BRONCHIAL BRUSHINGS BRONCHIAL NEEDLE ASPIRATION BIOPSIES BRONCHIAL BIOPSIES FIDUCIAL MARKER PLACEMENT     Patient location during evaluation: PACU Anesthesia Type: General Level of consciousness: awake and alert Pain management: pain level controlled Vital Signs Assessment: post-procedure vital signs reviewed and stable Respiratory status: spontaneous breathing, nonlabored ventilation, respiratory function stable and patient connected to nasal cannula oxygen Cardiovascular status: blood pressure returned to baseline and stable Postop Assessment: no apparent nausea or vomiting Anesthetic complications: no   No notable events documented.  Last Vitals:  Vitals:   08/07/23 1045 08/07/23 1100  BP: 100/75 (!) 108/58  Pulse: 70 68  Resp: 16 14  Temp: 36.9 C   SpO2: 97% 99%    Last Pain:  Vitals:   08/07/23 1010  TempSrc:   PainSc: 0-No pain                 Smriti Barkow

## 2023-08-07 NOTE — Progress Notes (Signed)
Patient appropriate for discharge, awaiting CXR results. Patient placed in phase 2 at this time.

## 2023-08-08 ENCOUNTER — Encounter (HOSPITAL_COMMUNITY): Payer: Self-pay | Admitting: Emergency Medicine

## 2023-08-09 ENCOUNTER — Encounter: Payer: Self-pay | Admitting: Hematology

## 2023-08-10 LAB — CYTOLOGY - NON PAP

## 2023-08-13 ENCOUNTER — Telehealth: Payer: Self-pay

## 2023-08-13 ENCOUNTER — Inpatient Hospital Stay: Payer: Medicare HMO | Attending: Hematology | Admitting: Hematology

## 2023-08-13 DIAGNOSIS — C8338 Diffuse large B-cell lymphoma, lymph nodes of multiple sites: Secondary | ICD-10-CM

## 2023-08-13 DIAGNOSIS — C3490 Malignant neoplasm of unspecified part of unspecified bronchus or lung: Secondary | ICD-10-CM | POA: Diagnosis not present

## 2023-08-13 MED ORDER — AMOXICILLIN-POT CLAVULANATE 875-125 MG PO TABS
1.0000 | ORAL_TABLET | Freq: Two times a day (BID) | ORAL | 0 refills | Status: DC
Start: 2023-08-13 — End: 2023-08-28

## 2023-08-13 NOTE — Telephone Encounter (Addendum)
2/3 Referral sent to wrong location; pt prefers [place], closer to her home -per Dr. Candise Che.  Will follow-up with radiation oncology in [place]. I can call the new pt coordinator there -per Alejandro Mulling.

## 2023-08-13 NOTE — Progress Notes (Shared)
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 female who has been referred to [place] by Carylon Perches, DO for evaluation and management of non hodgkin's lymphoma. She has a hx of chronic systolic heart failure.  Nasolaryngoscopy revealed a mass lesion of the left oropharynx, emanating from the left base of tongue/glossotonsillar sulcus. Patient subsequently had a CT neck with contrast performed on 08/09/2022, which reported extensive bilateral adenopaty, with greatest lymph node conglomerate measuring 3.2 x 2.4 cm in the right level 2. Patient underwent core needle biopsy on 08/18/22 and endorsed persistent throat pain and odynophagia at the time. She is a former smoker and quit in the late 90s. SHe endorsed heavy tobacco use prior to quitting, with 1.5 pack/day for about 30 years.   Today, she is accompanied by her daughter. She complains of a sore throat beginning around Thanksgiving time. She was given antibiotics and prednisone which did not improve symptoms. She endorses swelling in her neck with associated soreness which came quickly past 2-3 weeks. She reports that her neck edema has recently grown on both the left and right sides. Her neck pain is in a specific area. She also complains of recent her neck pain if she turns her neck.  She did present Emergency EMT because something was stuck in her throat. Endoscopy revealed that it was food and it was pushed down. An ulcer was also found in her esophagus and some narrowing which will be stretched in three  months. She has not had any issues with swallowing food since then. She has not had a biopsy of issue on the back of her tongue. No other lumps/bumps, unexplained fevers, chills, night sweat, change in breathing, abdominal pain, or skin rashes. She reports discomfort with drinking tea which caused her to eat less as well.   She is allergic to Sumatriptan and another medication she cannot recall. She does not consume alcohol and has quit smoking in the 90s. 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 She believes the sore was caused by shoe discomfort.  She denies any facial puffiness, abdominal pain, recent change in bowel habits or urination. She reports that her defibrillator has never gone off.   She reports a ganglion cyst on her right knee, 2 knee replacements, and 2 hip replacements. She denies a Fhx of blood disorders. However, her sister has lung cancer and was a frequent smoker.  She is UTD with her vaccinations, incuding influenza, RSV, and COVID-19 booster. She has endorsed migraines since childhood. She lives on her own is able to complete daily activities independently. She will follow-up with Dr. Marene Lenz soon. She follows up with cardiologist regularly. Her a fib, pacemaker, and ICD device have been stable. She has not had a recent echo.   INTERVAL HISTORY:  Rachel Bates is a 78 y.o. female who presents today for follow up and management of diffuse large B-cell lymphoma.  Patient was last seen by me on 08/03/2023 and she was doing well overall. .I connected  with Alfonse Alpers on 08/13/2023 at  8:40 AM EST by telephone visit and verified that I am speaking with the correct person using two identifiers.   Patient notes she has been doing well overall since our last visit. She denies any new infection issues, fever, chills, night sweats, unexpected weight loss, back pain, chest pain, or leg  swelling.   Discussed the cytology results from 08/07/2023 in detail with the patient.   I discussed the limitations, risks, security and privacy concerns of performing an evaluation and management service by telemedicine and the availability of in-person appointments. I also discussed with the patient that there may be a patient responsible charge related to this service. The patient expressed understanding and agreed to proceed.   Other persons participating in the visit and their role in the encounter: None   Patient's location: Home  Provider's location: [facility]   Chief Complaint: diffuse large B-cell lymphoma.    MEDICAL HISTORY:  Past Medical History:  Diagnosis Date   AICD (automatic cardioverter/defibrillator) present dual   [employer] ---  original placedment 2007/  generator change 2014 by dr gregg taylor   Anemia    Anticoagulant long-term use    eliquis   Anxiety    Arthralgia of multiple joints    Arthritis pain    Benign hypertensive heart disease    CAD (coronary artery disease) primary cardiologist-- dr gregg taylor   MI and 2 stents 1995 in [place]   Cancer [facility])    CHF NYHA class II, chronic, systolic (HCC)    followed by dr Sharlot Gowda taylor   Degenerative disc disease, lumbar    Depression    Dyslipidemia    History of basal cell carcinoma (BCC) excision    right ankle area s/p  excision in office 06/ 2019  in office   History of DVT of lower extremity yrs ago before 2012   History of MI (myocardial infarction) 1995  in [place]   History of pulmonary embolus (PE) 2012   History of ventricular tachycardia    Hyperlipidemia    Hypersomnia    Hypertension    Ischemic cardiomyopathy    Migraines    Myocardial infarction (HCC)    OA (osteoarthritis)    "all over"   PAF (paroxysmal atrial fibrillation) (HCC)    Pneumonia    02/2023   Primary localized osteoarthritis of right hip 12/18/2017   Primary localized osteoarthritis of right knee 05/28/2018   S/P  coronary artery stent placement 1995   in [facility]   05-21-2018 per pt x2  stents in same coronary artery (unsure BM or DES)   Vitamin D deficiency disease     SURGICAL HISTORY: Past Surgical History:  Procedure Laterality Date   BIOPSY  06/19/2022   Procedure: BIOPSY;  Surgeon: Kathi Der, MD;  Location: [facility] ENDOSCOPY;  Service: Gastroenterology;;   BRONCHIAL BIOPSY  08/07/2023   Procedure: BRONCHIAL BIOPSIES;  Surgeon: Leslye Peer, MD;  Location[facility] ENDOSCOPY;  Service: Pulmonary;;   BRONCHIAL BRUSHINGS  08/07/2023   Procedure: BRONCHIAL BRUSHINGS;  Surgeon: Leslye Peer, MD;  Location: [facility] ENDOSCOPY;  Service: Pulmonary;;   BRONCHIAL NEEDLE ASPIRATION BIOPSY  08/07/2023   Procedure: BRONCHIAL NEEDLE ASPIRATION BIOPSIES;  Surgeon: Leslye Peer, MD;  Location: [facility] ENDOSCOPY;  Service: Pulmonary;;   CARDIAC DEFIBRILLATOR PLACEMENT  2007   CATARACT EXTRACTION, BILATERAL     COLONOSCOPY  04/14/2013   colonic polyp, status post polypectomy. Mild panocolonic diverticulosis. Small internal hemorrhoids   DILATION  AND CURETTAGE OF UTERUS  yrs ago   DIRECT LARYNGOSCOPY Right 10/04/2022   Procedure: DIRECT LARYNGOSCOPY;  Surgeon: Cheron Schaumann A, DO;  Location: [facility] OR;  Service: ENT;  Laterality: Right;   ESOPHAGOGASTRODUODENOSCOPY (EGD) WITH PROPOFOL N/A 06/19/2022   Procedure: ESOPHAGOGASTRODUODENOSCOPY (EGD) WITH PROPOFOL;  Surgeon: Kathi Der, MD;  Location: [facility] ENDOSCOPY;  Service: Gastroenterology;  Laterality: N/A;   FIDUCIAL MARKER PLACEMENT  08/07/2023   Procedure: FIDUCIAL MARKER PLACEMENT;  Surgeon: Leslye Peer, MD;  Location[facility] ENDOSCOPY;  Service: Pulmonary;;   IMPACTION REMOVAL  06/19/2022   Procedure: IMPACTION REMOVAL;  Surgeon: Kathi Der, MD;  Location: [facility] ENDOSCOPY;  Service: Gastroenterology;;   IMPLANTABLE CARDIOVERTER DEFIBRILLATOR GENERATOR CHANGE N/A 02/04/2013   Procedure: IMPLANTABLE CARDIOVERTER DEFIBRILLATOR GENERATOR CHANGE;  Surgeon: Marinus Maw, MD;  Location: [facility] CATH LAB;  Service: Cardiovascular;  Laterality: N/A;   IR IMAGING GUIDED PORT INSERTION  10/27/2022   LYMPH NODE BIOPSY Right 10/04/2022   Procedure: EXCISIONAL OF RIGHT DEEP CERVICAL LYMPH NODE;  Surgeon: Laren Boom, DO;  Location: [facility] OR;  Service: ENT;  Laterality: Right;   SHOULDER ARTHROSCOPY Right 2015   TOTAL HIP ARTHROPLASTY Right 12/18/2017   Procedure: RIGHT TOTAL HIP ARTHROPLASTY;  Surgeon: Teryl Lucy, MD;  Location: [facility] OR;  Service: Orthopedics;  Laterality: Right;   TOTAL HIP ARTHROPLASTY Left 04/19/2021   Procedure: TOTAL HIP ARTHROPLASTY;  Surgeon: Teryl Lucy, MD;  Location: [facility] ORS;  Service: Orthopedics;  Laterality: Left;   TOTAL KNEE ARTHROPLASTY Right 05/28/2018   Procedure: TOTAL KNEE ARTHROPLASTY;  Surgeon: Teryl Lucy, MD;  Location: [facility] ORS;  Service: Orthopedics;  Laterality: Right;  Adductor Block   TOTAL KNEE ARTHROPLASTY Left 08/12/2021   Procedure: TOTAL KNEE ARTHROPLASTY;  Surgeon: Teryl Lucy, MD;  Location: [facility] ORS;  Service: Orthopedics;  Laterality: Left;   TUBAL LIGATION Bilateral yrs ago   VIDEO BRONCHOSCOPY WITH RADIAL ENDOBRONCHIAL ULTRASOUND  08/07/2023   Procedure: VIDEO BRONCHOSCOPY WITH RADIAL ENDOBRONCHIAL ULTRASOUND;  Surgeon: Leslye Peer, MD;  Location: [facility] ENDOSCOPY;  Service: Pulmonary;;   WISDOM TOOTH EXTRACTION      SOCIAL HISTORY: Social History   Socioeconomic History   Marital status: Single    Spouse name: Not on file   Number of children: Not on file   Years of education: Not on file   Highest education level: Not on file  Occupational History   Not on file  Tobacco Use   Smoking status: Former    Current packs/day: 0.00    Types: Cigarettes    Start date: 05/21/1966    Quit date: 05/21/1996    Years since quitting: 27.2   Smokeless tobacco: Never  Vaping Use   Vaping status: Never Used  Substance and Sexual Activity   Alcohol use: No   Drug use: Never   Sexual activity: Not on  file  Other Topics Concern   Not on file  Social History Narrative   Not on file   Social Drivers of Health   Financial Resource Strain: Low Risk  (05/28/2018)   Overall Financial Resource Strain (CARDIA)    Difficulty of Paying Living Expenses: Not hard at all  Food Insecurity: No Food Insecurity (02/15/2023)   Hunger Vital Sign    Worried About Running Out of Food in the Last Year: Never true    Ran Out of Food in the Last Year: Never true  Transportation Needs: No Transportation Needs (02/15/2023)   PRAPARE - Administrator, Civil Service (Medical):  No    Lack of Transportation (Non-Medical): No  Physical Activity: Not on file  Stress: Not on file  Social Connections: Not on file  Intimate Partner Violence: Unknown (02/15/2023)   Humiliation, Afraid, Rape, and Kick questionnaire    Fear of Current or Ex-Partner: No    Emotionally Abused: No    Physically Abused: Not on file    Sexually Abused: No    FAMILY HISTORY: Family History  Problem Relation Age of Onset   Hypertension Mother    Thyroid disease Mother    Alzheimer's disease Mother    Coronary artery disease Father    Pulmonary embolism Father    Congestive Heart Failure Maternal Grandmother    Hypertension Maternal Grandmother    Heart attack Maternal Grandfather    Other Maternal Grandfather        carotid disease   Dementia Paternal Grandmother    Other Paternal Grandfather 57       accident    ALLERGIES:  is allergic to sumatriptan succinate and amitriptyline.  MEDICATIONS:  Current Outpatient Medications  Medication Sig Dispense Refill   acetaminophen (TYLENOL) 650 MG CR tablet Take 1,300 mg by mouth every 8 (eight) hours as needed for pain.     acyclovir (ZOVIRAX) 400 MG tablet TAKE 1 TABLET BY MOUTH TWICE A DAY 180 tablet 2   apixaban (ELIQUIS) 5 MG TABS tablet Take 1 tablet (5 mg total) by mouth 2 (two) times daily. Okay to restart this medication on 08/08/2023     b complex vitamins capsule  Take 1 capsule by mouth daily.     Calcium Carb-Cholecalciferol (CALCIUM 600 + D PO) Take 1 tablet by mouth daily.     carvedilol (COREG) 12.5 MG tablet Take 12.5 mg by mouth 2 (two) times daily with a meal.      Cholecalciferol (VITAMIN D3) 125 MCG (5000 UT) capsule Take 5,000 Units by mouth daily.     cyanocobalamin (VITAMIN B12) 1000 MCG tablet Take 1,000 mcg by mouth daily.     FIBER PO Take 1 capsule by mouth 2 (two) times daily.     Fiber POWD Take 1 Scoop by mouth daily.     gabapentin (NEURONTIN) 300 MG capsule Take 300 mg by mouth 3 (three) times daily.     loratadine (CLARITIN) 10 MG tablet Take 10 mg by mouth daily.     Omega-3 Fatty Acids (FISH OIL) 1000 MG CAPS Take 1,000 mg by mouth daily.      ramipril (ALTACE) 2.5 MG tablet Take 2.5 mg by mouth daily.       simvastatin (ZOCOR) 40 MG tablet Take 40 mg by mouth at bedtime.       tiZANidine (ZANAFLEX) 4 MG tablet Take 2 mg by mouth daily as needed for muscle spasms.     traZODone (DESYREL) 50 MG tablet Take 50 mg by mouth at bedtime.      venlafaxine (EFFEXOR) 75 MG tablet Take 75 mg by mouth daily.     vitamin E 400 UNIT capsule Take 400 Units by mouth daily.     zonisamide (ZONEGRAN) 50 MG capsule Take 50-100 mg by mouth See admin instructions. Take 50mg  by mouth in the morning and 100mg  at night.     No current facility-administered medications for this visit.    REVIEW OF SYSTEMS:    10 Point review of Systems was done is negative except as noted above.   PHYSICAL EXAMINATION: .There were no vitals taken for this visit.  GENERAL:alert, in no acute distress and comfortable SKIN: no acute rashes, no significant lesions EYES: conjunctiva are pink and non-injected, sclera anicteric OROPHARYNX: MMM, no exudates, no oropharyngeal erythema or ulceration NECK: supple, no JVD LYMPH:  no palpable lymphadenopathy in the cervical, axillary or inguinal regions LUNGS: clear to auscultation b/l with normal respiratory  effort HEART: regular rate & rhythm ABDOMEN:  normoactive bowel sounds , non tender, not distended. Extremity: no pedal edema PSYCH: alert & oriented x 3 with fluent speech NEURO: no focal motor/sensory deficits   LABORATORY DATA:  I have reviewed the data as listed    Latest Ref Rng & Units 08/03/2023   11:05 AM 05/07/2023    9:35 AM 03/05/2023    2:30 PM  CBC  WBC 4.0 - 10.5 K/uL 4.0  3.2  3.7   Hemoglobin 12.0 - 15.0 g/dL 45.4  09.8  11.9   Hematocrit 36.0 - 46.0 % 41.8  38.8  35.7   Platelets 150 - 400 K/uL 107  115  104       Latest Ref Rng & Units 08/03/2023   11:05 AM 05/07/2023    9:35 AM 03/05/2023    2:30 PM  CMP  Glucose 70 - 99 mg/dL 87  91  147   BUN 8 - 23 mg/dL 13  20  17    Creatinine 0.44 - 1.00 mg/dL 8.29  5.62  1.30   Sodium 135 - 145 mmol/L 141  139  138   Potassium 3.5 - 5.1 mmol/L 4.3  3.8  3.6   Chloride 98 - 111 mmol/L 104  107  107   CO2 22 - 32 mmol/L 31  26  26    Calcium 8.9 - 10.3 mg/dL 86.5  9.3  9.1   Total Protein 6.5 - 8.1 g/dL 6.9  6.6  5.9   Total Bilirubin 0.0 - 1.2 mg/dL 0.7  0.5  0.4   Alkaline Phos 38 - 126 U/L 82  88  67   AST 15 - 41 U/L 24  20  16    ALT 0 - 44 U/L 19  17  13     Lab Results  Component Value Date   LDH 180 08/03/2023   Molecular Pathology 10/21/2022:    Surgical Pathology 10/04/22: A. LYMPH NODE, RIGHT LEVEL 2 DEEP CERVICAL, EXCISION:  -Diffuse large B-cell lymphoma  -See comment COMMENT: The sections show diffuse effacement of the lymph nodal architecture primarily by a population of large lymphoid cells characterized by vesicular chromatin and small nucleoli associated with apoptosis and brisk mitosis.  In some areas, the large atypical lymphoid proliferation extends into the perinodal adipose tissue.  In this background, there are scattered variably sized and somewhat disrupted aggregates of primarily small lymphoid cells characterized by high nuclear cytoplasmic ratio, angulated nuclear contours and small  to inconspicuous nucleoli. Flow cytometric analysis was performed [facility] [phone]) and shows a monoclonal, lambda restricted B-cell population expressing CD10.  In addition, immunohistochemical stains for CD3, CD5, CD10, CD20, PAX5, BCL6, Bcl-2, Ki-67, CD30, CD138, CD21, EBV in addition to in situ hybridization for kappa and lambda were performed with appropriate controls.  The large lymphoid cells are positive for CD20, PAX5, CD10, BCL6, Bcl-2, and partially for cytoplasmic lambda.  No significant staining is seen with EBV, CD30, CD138 or cytoplasmic kappa.  Ki67 shows variably increased expression (more than 50% in some areas). CD21 highlights scattered somewhat disrupted follicular dendritic networks. The lymphoid aggregates of primarily small lymphoid cells previously described show positivity for  B-cell markers CD20 and PAX5 in addition to CD10 and Bcl-2.  There is an admixed variable T-cell component in the background as seen with CD3 and CD5 and there is no apparent co-expression of CD5 in B-cell areas.  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    RADIOGRAPHIC STUDIES: I have personally reviewed the radiological images as listed and agreed with the findings in the report. CT Super D Chest Wo Contrast Result Date: 08/12/2023 CLINICAL DATA:  Follow-up right upper lobe pulmonary nodule. History of lymphoma. Preop for EMB and biopsy. * Tracking Code: BO * EXAM: CT CHEST WITHOUT CONTRAST TECHNIQUE: Multidetector CT imaging of the chest was performed using thin slice collimation for electromagnetic bronchoscopy planning purposes, without intravenous contrast. RADIATION DOSE REDUCTION: This exam was performed according to the departmental dose-optimization program which includes automated exposure control, adjustment of the mA and/or kV according to patient  size and/or use of iterative reconstruction technique. COMPARISON:  Chest CT 04/30/2023. FINDINGS: Cardiovascular: The heart is normal in size. No pericardial effusion. 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 Stable pacer wires. Stable atherosclerotic calcification involving the aorta and coronary arteries. Mediastinum/Nodes: Progressive enlargement of mediastinal nodes. Right precarinal node measures 10 mm and previously measured 4 mm. Subcarinal node measures 10 mm and previously measured 4 mm. The esophagus is grossly normal. Lungs/Pleura: Interval significant enlargement of the branching mass in the right upper lobe partially obstructing the right upper lobe bronchus. This measures approximately 2.8 x 1.7 cm on image 57/4. Findings suggest progressive endobronchial tumor. Stable tree-in-bud type nodularity in the left upper lobe. No new or progressive changes. Stable bibasilar scarring changes and underlying emphysema. No pleural effusions or pleural nodules. Upper Abdomen: No significant upper abdominal findings. No hepatic or adrenal gland lesions. Stable vascular calcifications. Musculoskeletal: No significant bony findings. IMPRESSION: 1. Interval significant enlargement of the branching mass in the right upper lobe partially obstructing the right upper lobe bronchus. Findings suggest progressive endobronchial tumor. 2. Progressive enlargement of mediastinal nodes worrisome for metastatic adenopathy. 3. Stable tree-in-bud type nodularity in the left upper lobe. No new or progressive changes. 4. 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 5. Aortic atherosclerosis. Electronically Signed   By: Rudie Meyer M.D.   On: 08/12/2023 14:06   DG Chest Port 1 View Result Date: 08/07/2023 CLINICAL DATA:  [id number] S/P bronchoscopy with biopsy [id number] EXAM: PORTABLE CHEST 1 VIEW COMPARISON:  08/03/2023 FINDINGS: Right  chest port and left-sided implanted cardiac device remain in place. Heart size within normal limits. Aortic atherosclerosis. Nodular density in the right perihilar region with fiducial marker. Lungs are otherwise clear. No pleural effusion or pneumothorax. IMPRESSION: 1. No pneumothorax. 2. Nodular density in the right perihilar region with fiducial marker. Electronically Signed   By: Duanne Guess D.O.   On: 08/07/2023 11:41   DG C-ARM BRONCHOSCOPY Result Date: 08/07/2023 C-ARM BRONCHOSCOPY: Fluoroscopy was utilized by the requesting physician.  No radiographic interpretation.   CUP PACEART INCLINIC DEVICE CHECK Result Date: 07/27/2023 Full device interrogation not performed. Pt AS/VS 100%.  No episodes noted. Pt RRT.Ancil Boozer, BSN, RN   NM PET 09/04/2022: IMPRESSION: 1. There is a large tracer avid mass centered within the left base of tongue and left lingual region which extends into the hypopharyngeal region ventral to the epiglottis. Imaging findings are compatible with a primary  head and neck malignancy. 2. A smaller focus of increased uptake localizes to the right pharyngeal tonsil region. Indeterminate. 3. Bilateral tracer avid cervical and supraclavicular lymph nodes compatible with nodal metastasis. 4. There is a 7 mm tracer avid nodule within the anteromedial left upper lobe which is suspicious for pulmonary metastasis. 5. There is increased uptake identified along the long axis of the posterior right seventh rib. No corresponding lytic or sclerotic changes identified on the CT images. Cannot exclude bone metastases. 6. Multiple prominent retroperitoneal and mesenteric lymph nodes are identified which exhibit mild tracer uptake. This is a nonspecific finding and may reflect reactive adenopathy. Metastatic adenopathy cannot be excluded. Attention on future surveillance imaging is advised. 7.  Aortic Atherosclerosis (ICD10-I70.0).   ASSESSMENT & PLAN:   78 y.o.  female  with:   1. 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 2. 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 RUL lung nodule on PET 03/09/2023 -- referred to pulmonology. Has see seen Dr Icard/Dr Delton Coombes and is scheduled for CT chest superD and FOB for Bx. Concerning for possible primary lung malignancy though lymphoma is also on the differential though less likely.  PLAN: -Discussed Cytology results from 08/07/2023 in detail with the patient.Showed diagnosis of Squamous cell carcinoma.  -Discussed the option to repeat PET scan. Pt agrees.  -We will refer the patient to Radiation oncologist to see the treatment options. -Discussed with the patient that we will send Augmentin for 10 days to see if the lymph nodes are reactive.  -Discussed with the patient that if  the staging of her squamous cell carcinoma is localized stage 1/2 we will SBRT treatment. If it is stage 3 then we will consider chemo-radiation treatment.  -Answered all the questions.   FOLLOW-UP: ***  The total time spent in the appointment was *** minutes* .  All of the patient's questions were answered with apparent satisfaction. The patient knows to call the clinic with any problems, questions or concerns.   Wyvonnia Lora MD MS AAHIVMS [facility] Hematology/Oncology Physician [facility]  .*Total Encounter Time as defined by the Centers for Medicare and Medicaid Services includes, in addition to the face-to-face time of a patient visit (documented in the note above) non-face-to-face time: obtaining and reviewing outside history, ordering and reviewing medications, tests or procedures, care coordination (communications with other health care professionals or caregivers)  and documentation in the medical record.   I,Param Shah,acting as a Neurosurgeon for Wyvonnia Lora, MD.,have documented all relevant documentation on the behalf of Wyvonnia Lora, MD,as directed by  Wyvonnia Lora, MD while in the presence of Wyvonnia Lora, MD.

## 2023-08-14 ENCOUNTER — Encounter: Payer: Self-pay | Admitting: Acute Care

## 2023-08-14 ENCOUNTER — Ambulatory Visit: Payer: Medicare HMO | Admitting: Acute Care

## 2023-08-14 VITALS — BP 128/70 | HR 78 | Ht 67.0 in | Wt 155.8 lb

## 2023-08-14 DIAGNOSIS — C3490 Malignant neoplasm of unspecified part of unspecified bronchus or lung: Secondary | ICD-10-CM | POA: Diagnosis not present

## 2023-08-14 DIAGNOSIS — I700 Atherosclerosis of aorta: Secondary | ICD-10-CM | POA: Diagnosis not present

## 2023-08-14 DIAGNOSIS — F339 Major depressive disorder, recurrent, unspecified: Secondary | ICD-10-CM | POA: Diagnosis not present

## 2023-08-14 DIAGNOSIS — R5383 Other fatigue: Secondary | ICD-10-CM | POA: Diagnosis not present

## 2023-08-14 DIAGNOSIS — Z79899 Other long term (current) drug therapy: Secondary | ICD-10-CM | POA: Diagnosis not present

## 2023-08-14 DIAGNOSIS — Z139 Encounter for screening, unspecified: Secondary | ICD-10-CM | POA: Diagnosis not present

## 2023-08-14 DIAGNOSIS — I10 Essential (primary) hypertension: Secondary | ICD-10-CM | POA: Diagnosis not present

## 2023-08-14 DIAGNOSIS — G43909 Migraine, unspecified, not intractable, without status migrainosus: Secondary | ICD-10-CM | POA: Diagnosis not present

## 2023-08-14 DIAGNOSIS — C3411 Malignant neoplasm of upper lobe, right bronchus or lung: Secondary | ICD-10-CM | POA: Diagnosis not present

## 2023-08-14 DIAGNOSIS — E782 Mixed hyperlipidemia: Secondary | ICD-10-CM | POA: Diagnosis not present

## 2023-08-14 DIAGNOSIS — I509 Heart failure, unspecified: Secondary | ICD-10-CM | POA: Diagnosis not present

## 2023-08-14 DIAGNOSIS — C8590 Non-Hodgkin lymphoma, unspecified, unspecified site: Secondary | ICD-10-CM | POA: Diagnosis not present

## 2023-08-14 LAB — LAB REPORT - SCANNED: EGFR: 75

## 2023-08-14 NOTE — Patient Instructions (Addendum)
 It is good to see you today. I am glad you have done well with the bronchoscopy. 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 I have referred you to radiation oncology. You will get a call to get this scheduled. I have also ordered a PET scan for staging. You will get a call to get this scheduled. MRI Brain to be ordered by Radiation oncology after pace maker is removed 08/28/2023. Rachel Bates with treatment . [facility] team will take great care of you.  Please contact office for sooner follow up if symptoms do not improve or worsen or seek emergency care

## 2023-08-14 NOTE — Progress Notes (Signed)
I reached out to the pt to see if she would like to be seen at [facility] at [facility] for radiation oncology consult, or if she would rather go to [place]. LVM with my name and number requesting a return phone call.

## 2023-08-14 NOTE — Progress Notes (Signed)
 History of Present Illness Rachel Bates is a 78 y.o. female former smoker ( Quit 1997 with a 30 pack year smoking history.Referred to see Dr. 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 Patient has a new right upper lobe pulmonary nodule hypermetabolic on previous pet imaging was referred here for consideration of bronchoscopy and biopsy. She was seen by Dr. Brenna  1/156/2025, and scheduled for bronchoscopy with biopsies 08/07/2023.She is here today for follow up to ensure she has done well post procedure and to review cytology.   08/14/2023 Pt. Presents for bronchoscopy with biopsy follow up. She denies any bleeding, fever, dyspnea or adverse reaction to anesthesia. She tolerated the procedure well.   We have reviewed her cytology results. They were positive for squamous cell lung cancer. She had been called by Dr. Onesimo yesterday, who had reviewed these results with her. I have referred her to radiation oncology, and I have ordered a PET scan for staging. Dr. Onesimo had prescribed a 10 day treatment with antibiotics to ensure the PET scan did not pick up any infection that could muddy the results of the scan. She will schedule the PET after she has completed antibiotics. She will also need an MR Brain. She is scheduled to have her pacemaker removed 08/28/2023, MR brain can be scheduled after the pace maker has been removed.  Test Results: Cytology 08/07/2023 FINAL MICROSCOPIC DIAGNOSIS:  A. LUNG, RUL, NEEDLE ASPIRATIONS  BIOPSIES:  - Squamous cell carcinoma.   B. LUNG, RUL, BRUSHING:  - Squamous cell carcinoma.   Super D CT chest 08/03/2023 Interval significant enlargement of the branching mass in the right upper lobe partially obstructing the right upper lobe bronchus. Findings  suggest progressive endobronchial tumor. 2. Progressive enlargement of mediastinal nodes worrisome for metastatic adenopathy. 3. Stable tree-in-bud type nodularity in the left upper lobe. No new or progressive changes. 4. 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 5. Aortic atherosclerosis.   PET scan 03/09/2023 Hypermetabolic peribronchovascular solid pulmonary nodules in the central right upper lobe and peripheral left upper lobe, increased/new since 12/25/2022 PET-CT, suspicious for recurrent lymphoma. Deauville score 5. 2. No recurrent hypermetabolic lymphadenopathy in the neck, abdomen or pelvis. 3. New focus of mild hypermetabolism at the anterior right second costochondral junction with apparent slight cortical step-off suggesting a fracture in this location, without discrete osseous lesion. Correlate for history of trauma. 4. Late subacute/early chronic right inferior pubic ramus fracture, incompletely healed. Chronic evolving right sacral insufficiency fracture. 5.  Aortic Atherosclerosis (ICD10-I70.0).     Latest Ref Rng & Units 08/03/2023   11:05 AM 05/07/2023    9:35 AM 03/05/2023    2:30 PM  CBC  WBC 4.0 - 10.5 K/uL 4.0  3.2  3.7   Hemoglobin 12.0 - 15.0 g/dL 85.6  86.8  88.4   Hematocrit 36.0 - 46.0 % 41.8  38.8  35.7   Platelets 150 - 400 K/uL 107  115  104        Latest Ref Rng & Units 08/03/2023   11:05 AM 05/07/2023    9:35 AM 03/05/2023    2:30 PM  BMP  Glucose 70 - 99 mg/dL 87  91  889   BUN 8 - 23 mg/dL 13  20  17    Creatinine 0.44 -  1.00 mg/dL 9.27  9.28  9.31   Sodium 135 - 145 mmol/L 141  139  138   Potassium 3.5 - 5.1 mmol/L 4.3  3.8  3.6   Chloride 98 - 111 mmol/L 104  107  107   CO2 22 - 32 mmol/L 31  26  26    Calcium  8.9 - 10.3 mg/dL 89.6  9.3  9.1     BNP    Component Value Date/Time   BNP 322.0 (H) 02/15/2023 0530    ProBNP No results found for: PROBNP  PFT No results found for:  FEV1PRE, FEV1POST, FVCPRE, FVCPOST, TLC, DLCOUNC, PREFEV1FVCRT, PSTFEV1FVCRT  CT Super D Chest Wo Contrast Result Date: 08/12/2023 CLINICAL DATA:  Follow-up right upper lobe pulmonary nodule. History of lymphoma. Preop for EMB and biopsy. * Tracking Code: BO * EXAM: CT CHEST WITHOUT CONTRAST TECHNIQUE: Multidetector CT imaging of the chest was performed using thin slice collimation for electromagnetic bronchoscopy planning purposes, without intravenous contrast. RADIATION DOSE REDUCTION: This exam was performed according to the departmental dose-optimization program which includes automated exposure control, adjustment of the mA and/or kV according to patient size and/or use of iterative reconstruction technique. COMPARISON:  Chest CT 04/30/2023. FINDINGS: Cardiovascular: The heart is normal in size. No pericardial effusion. 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 Stable pacer wires. Stable atherosclerotic calcification involving the aorta and coronary arteries. Mediastinum/Nodes: Progressive enlargement of mediastinal nodes. Right precarinal node measures 10 mm and previously measured 4 mm. Subcarinal node measures 10 mm and previously measured 4 mm. The esophagus is grossly normal. Lungs/Pleura: Interval significant enlargement of the branching mass in the right upper lobe partially obstructing the right upper lobe bronchus. This measures approximately 2.8 x 1.7 cm on image 57/4. Findings suggest progressive endobronchial tumor. Stable tree-in-bud type nodularity in the left upper lobe. No new or progressive changes. Stable bibasilar scarring changes and underlying emphysema. No pleural effusions or pleural nodules. Upper Abdomen: No significant upper abdominal findings. No hepatic or adrenal gland lesions. Stable vascular calcifications. Musculoskeletal: No significant bony findings. IMPRESSION: 1. Interval significant enlargement of the  branching mass in the right upper lobe partially obstructing the right upper lobe bronchus. Findings suggest progressive endobronchial tumor. 2. Progressive enlargement of mediastinal nodes worrisome for metastatic adenopathy. 3. Stable tree-in-bud type nodularity in the left upper lobe. No new or progressive changes. 4. 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 5. Aortic atherosclerosis. Electronically Signed   By: MYRTIS Stammer M.D.   On: 08/12/2023 14:06   DG Chest Port 1 View Result Date: 08/07/2023 CLINICAL DATA:  [id number] S/P bronchoscopy with biopsy [id number] EXAM: PORTABLE CHEST 1 VIEW COMPARISON:  08/03/2023 FINDINGS: Right chest port and left-sided implanted cardiac device remain in place. Heart size within normal limits. Aortic atherosclerosis. Nodular density in the right perihilar region with fiducial marker. Lungs are otherwise clear. No pleural effusion or pneumothorax. IMPRESSION: 1. No pneumothorax. 2. Nodular density in the right perihilar region with fiducial marker. Electronically Signed   By: Mabel Converse D.O.   On: 08/07/2023 11:41   DG C-ARM BRONCHOSCOPY Result Date: 08/07/2023 C-ARM BRONCHOSCOPY: Fluoroscopy was utilized by the requesting physician.  No radiographic interpretation.   CUP PACEART INCLINIC DEVICE CHECK Result Date: 07/27/2023 Full device interrogation not performed. Pt AS/VS 100%.  No episodes noted. Pt RRT.Randall Sharps, BSN, RN    Past medical hx Past Medical History:  Diagnosis Date   AICD (automatic cardioverter/defibrillator) present dual   [employer] ---  original placedment 2007/  generator change 2014 by dr gregg taylor   Anemia    Anticoagulant long-term use    eliquis    Anxiety    Arthralgia of multiple joints    Arthritis pain    Benign hypertensive heart disease    CAD (coronary artery disease) primary cardiologist-- dr danelle taylor   MI and 2 stents 1995 in [place]    Cancer [facility])     CHF NYHA class II, chronic, systolic (HCC)    followed by dr danelle taylor   Degenerative disc disease, lumbar    Depression    Dyslipidemia    History of basal cell carcinoma (BCC) excision    right ankle area s/p  excision in office 06/ 2019  in office   History of DVT of lower extremity yrs ago before 2012   History of MI (myocardial infarction) 1995  in [place]   History of pulmonary embolus (PE) 2012   History of ventricular tachycardia    Hyperlipidemia    Hypersomnia    Hypertension    Ischemic cardiomyopathy    Migraines    Myocardial infarction (HCC)    OA (osteoarthritis)    all over   PAF (paroxysmal atrial fibrillation) (HCC)    Pneumonia    02/2023   Primary localized osteoarthritis of right hip 12/18/2017   Primary localized osteoarthritis of right knee 05/28/2018   S/P coronary artery stent placement 1995   in [facility]   05-21-2018 per pt x2  stents in same coronary artery (unsure BM or DES)   Vitamin D  deficiency disease      Social History   Tobacco Use   Smoking status: Former    Current packs/day: 0.00    Types: Cigarettes    Start date: 05/21/1966    Quit date: 05/21/1996    Years since quitting: 27.2   Smokeless tobacco: Never  Vaping Use   Vaping status: Never Used  Substance Use Topics   Alcohol use: No   Drug use: Never    Rachel Bates reports that she quit smoking about 27 years ago. Her smoking use included cigarettes. She started smoking about 57 years ago. She has never used smokeless tobacco. She reports that she does not drink alcohol and does not use drugs.  Tobacco Cessation: Counseling given: Not Answered Former smoker , quit 05/21/1996  Past surgical hx, Family hx, Social hx all reviewed.  Current Outpatient Medications on File Prior to Visit  Medication Sig   acetaminophen  (TYLENOL ) 650 MG CR tablet Take 1,300 mg by mouth every 8 (eight) hours as needed for pain.   amoxicillin -clavulanate (AUGMENTIN ) 875-125 MG tablet Take 1 tablet by  mouth 2 (two) times daily.   apixaban  (ELIQUIS ) 5 MG TABS tablet Take 1 tablet (5 mg total) by mouth 2 (two) times daily. Okay to restart this medication on 08/08/2023   b complex vitamins capsule Take 1 capsule by mouth daily.   Calcium  Carb-Cholecalciferol  (CALCIUM  600 + D PO) Take 1 tablet by mouth daily.   carvedilol  (COREG ) 12.5 MG tablet Take 12.5 mg by mouth 2 (two) times daily with a meal.    Cholecalciferol  (VITAMIN D3) 125 MCG (5000 UT) capsule Take 5,000 Units by mouth daily.   cyanocobalamin  (VITAMIN B12) 1000 MCG tablet Take 1,000 mcg by mouth daily.   FIBER PO Take 1 capsule by mouth 2 (two) times daily.   Fiber POWD Take 1 Scoop by mouth daily.   gabapentin  (NEURONTIN ) 300 MG capsule Take 300 mg by  mouth 3 (three) times daily.   loratadine  (CLARITIN ) 10 MG tablet Take 10 mg by mouth daily.   Omega-3 Fatty Acids (FISH OIL) 1000 MG CAPS Take 1,000 mg by mouth daily.    ramipril  (ALTACE ) 2.5 MG tablet Take 2.5 mg by mouth daily.     simvastatin  (ZOCOR ) 40 MG tablet Take 40 mg by mouth at bedtime.     tiZANidine  (ZANAFLEX ) 4 MG tablet Take 2 mg by mouth daily as needed for muscle spasms.   traZODone  (DESYREL ) 50 MG tablet Take 50 mg by mouth at bedtime.    venlafaxine  (EFFEXOR ) 75 MG tablet Take 75 mg by mouth daily.   vitamin E  400 UNIT capsule Take 400 Units by mouth daily.   zonisamide  (ZONEGRAN ) 50 MG capsule Take 50-100 mg by mouth See admin instructions. Take 50mg  by mouth in the morning and 100mg  at night.   No current facility-administered medications on file prior to visit.     Allergies  Allergen Reactions   Sumatriptan Succinate Other (See Comments)    Chest pain, no triptans, pt states it makes my heart race   Amitriptyline Other (See Comments)    Weight gain    Review Of Systems:  Constitutional:   No  weight loss, night sweats,  Fevers, chills, fatigue, or  lassitude.  HEENT:   No headaches,  Difficulty swallowing,  Tooth/dental problems, or  Sore throat,                 No sneezing, itching, ear ache, nasal congestion, post nasal drip,   CV:  No chest pain,  Orthopnea, PND, swelling in lower extremities, anasarca, dizziness, palpitations, syncope.   GI  No heartburn, indigestion, abdominal pain, nausea, vomiting, diarrhea, change in bowel habits, loss of appetite, bloody stools.   Resp: No shortness of breath with exertion or at rest.  No excess mucus, no productive cough,  No non-productive cough,  No coughing up of blood.  No change in color of mucus.  No wheezing.  No chest wall deformity  Skin: no rash or lesions.  GU: no dysuria, change in color of urine, no urgency or frequency.  No flank pain, no hematuria   MS:  No joint pain or swelling.  No decreased range of motion.  No back pain.  Psych:  No change in mood or affect. No depression or anxiety.  No memory loss.   Vital Signs BP 128/70   Pulse 78   Ht 5' 7 (1.702 m)   Wt 155 lb 12.8 oz (70.7 kg)   SpO2 97%   BMI 24.40 kg/m    Physical Exam:  General- No distress,  A&Ox3, pleasant ENT: No sinus tenderness, TM clear, pale nasal mucosa, no oral exudate,no post nasal drip, no LAN Cardiac: S1, S2, regular rate and rhythm, no murmur Chest: No wheeze/ rales/ dullness; no accessory muscle use, no nasal flaring, no sternal retractions Abd.: Soft Non-tender, ND, BS +, Body mass index is 24.4 kg/m.  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 History of lymphoma, currently not under treatment. Discussed the need for further staging and radiation therapy. -Order PET scan for staging after completion of a 10-day course of Amoxicillin . -Referred to radiation oncology for evaluation and treatment planning. -Consider SBRT radiation therapy if available and appropriate.  History of  Lymphoma In remission, no current treatment. -Continue monitoring as per oncologist's plan.  Pacemaker Scheduled for removal on 08/28/2023. -Order MRI Brain after pacemaker removal to complete staging  Follow up as needed  I spent 25 minutes dedicated to the care of this patient on the date of this encounter to include pre-visit review of records, face-to-face time with the patient discussing conditions above, post visit ordering of testing, clinical documentation with the electronic health record, making appropriate referrals as documented, and communicating necessary information to the patient's healthcare team.    Lauraine JULIANNA Lites, NP 08/14/2023  2:42 PM

## 2023-08-15 ENCOUNTER — Encounter: Payer: Self-pay | Admitting: Hematology

## 2023-08-15 NOTE — Progress Notes (Signed)
 8:34 I left a VM for the pt inquiring about whether she would like to receive her radiation in [place] or here in [place].  9:11 Pt returned my call and states she would like to receive her treatment in [place].  9:32: I reached out to Terrence,  NP coordinator in [place] who states she can get the pt scheduled with their radiation oncologist and will reach out to the pt to get her scheduled.  4:00pm pt called me again to let me know that she has to get her treatment done in [place] because [place] doesn't do the type of radiation that is recommended. I let the pt know the nurse from Rad Onc had already reached out to me and they'll be contacting her soon to make a consult appt. Pt verbalized appreciation. I let her know she can feel free to call me with any questions or concerns.

## 2023-08-16 ENCOUNTER — Encounter: Payer: Self-pay | Admitting: Internal Medicine

## 2023-08-16 ENCOUNTER — Other Ambulatory Visit: Payer: Self-pay | Admitting: Acute Care

## 2023-08-16 ENCOUNTER — Other Ambulatory Visit: Payer: Self-pay

## 2023-08-16 ENCOUNTER — Other Ambulatory Visit: Payer: Medicare HMO

## 2023-08-16 DIAGNOSIS — C3490 Malignant neoplasm of unspecified part of unspecified bronchus or lung: Secondary | ICD-10-CM

## 2023-08-16 NOTE — Progress Notes (Signed)
 I have called the patient. I explained that I have placed an order for a surgical consult , and also MRI Brain. She is having her pacemaker removed 2/18, so MR brain will be done week of 2/24. I have also ordered PFT's. She understands she will get calls to get these scheduled.

## 2023-08-16 NOTE — Progress Notes (Signed)
 The proposed treatment discussed in conference is for discussion purpose only and is not a binding recommendation.  The patients have not been physically examined, or presented with their treatment options.  Therefore, final treatment plans cannot be decided.

## 2023-08-19 ENCOUNTER — Encounter: Payer: Self-pay | Admitting: Hematology

## 2023-08-20 ENCOUNTER — Telehealth: Payer: Self-pay | Admitting: Internal Medicine

## 2023-08-20 NOTE — Telephone Encounter (Signed)
 Spoke to [employer]. Explained to [facility] that it doesn't look like pts device is MRI compatible. Told her I am looking further into it and would let her know once I had more information.

## 2023-08-20 NOTE — Telephone Encounter (Signed)
 Caller (Beth, Dr. Epifania Haskell nurse) wants a call back from Dr. Alise Iron regarding patient care.

## 2023-08-20 NOTE — Telephone Encounter (Signed)
 Patient is requesting to speak with RN Bobby Burr in regard to the patient having her Defib removed. Please review 08/16/23 patient message.

## 2023-08-20 NOTE — Telephone Encounter (Signed)
 Spoke with Pt. Pt wants to make sure she is ok to get the MRI, if anything needs to be done and to cancel procedure. Told pt we would call back once we had set answers for some of her questions.

## 2023-08-21 ENCOUNTER — Other Ambulatory Visit: Payer: Self-pay | Admitting: Acute Care

## 2023-08-21 DIAGNOSIS — C3490 Malignant neoplasm of unspecified part of unspecified bronchus or lung: Secondary | ICD-10-CM

## 2023-08-22 ENCOUNTER — Other Ambulatory Visit: Payer: Self-pay

## 2023-08-22 ENCOUNTER — Telehealth: Payer: Self-pay | Admitting: Emergency Medicine

## 2023-08-22 ENCOUNTER — Telehealth: Payer: Self-pay | Admitting: Acute Care

## 2023-08-22 DIAGNOSIS — C3490 Malignant neoplasm of unspecified part of unspecified bronchus or lung: Secondary | ICD-10-CM

## 2023-08-22 NOTE — Telephone Encounter (Signed)
PT states Rachel Bates called her to tell her that her CT has been sched but her Rachel Bates shows it is cancelled.Please call to advise PT @ [phone]

## 2023-08-22 NOTE — Telephone Encounter (Signed)
CT has been scheduled for 08/29/2023.

## 2023-08-22 NOTE — Telephone Encounter (Addendum)
[name],  Can you call Rachel Bates and see if they can complete Head CT with PET already scheduled for this Friday 08/24/2023 at [facility]? Previous CT order has been cancelled and new order placed. I have cancelled the CT at [facility]. If I need to call and speak with the patient please let me know. Thank you!  Revonda Standard

## 2023-08-22 NOTE — Telephone Encounter (Signed)
[facility] is wondering if patient needs a PET scan and if they needed the brain Ct as well. Please call back and advise.

## 2023-08-22 NOTE — Telephone Encounter (Signed)
Pt has been rescheduled and pt has been informed.

## 2023-08-24 ENCOUNTER — Encounter (HOSPITAL_COMMUNITY)
Admission: RE | Admit: 2023-08-24 | Discharge: 2023-08-24 | Disposition: A | Discharge status: Home / Self Care | Payer: Medicare HMO | Source: Ambulatory Visit | Attending: Hematology | Admitting: Hematology

## 2023-08-24 DIAGNOSIS — C3490 Malignant neoplasm of unspecified part of unspecified bronchus or lung: Secondary | ICD-10-CM | POA: Diagnosis not present

## 2023-08-24 DIAGNOSIS — C3411 Malignant neoplasm of upper lobe, right bronchus or lung: Secondary | ICD-10-CM | POA: Diagnosis not present

## 2023-08-24 LAB — GLUCOSE, CAPILLARY: Glucose-Capillary: 105 mg/dL — ABNORMAL HIGH (ref 70–99)

## 2023-08-24 MED ORDER — FLUDEOXYGLUCOSE F - 18 (FDG) INJECTION
7.7800 | Freq: Once | INTRAVENOUS | Status: AC
  Administered 2023-08-24: 7.78 via INTRAVENOUS

## 2023-08-27 ENCOUNTER — Ambulatory Visit (HOSPITAL_COMMUNITY)
Admission: RE | Admit: 2023-08-27 | Discharge: 2023-08-27 | Disposition: A | Discharge status: Home / Self Care | Payer: Medicare HMO | Source: Ambulatory Visit | Attending: Acute Care | Admitting: Acute Care

## 2023-08-27 DIAGNOSIS — J984 Other disorders of lung: Secondary | ICD-10-CM | POA: Diagnosis not present

## 2023-08-27 DIAGNOSIS — C3490 Malignant neoplasm of unspecified part of unspecified bronchus or lung: Secondary | ICD-10-CM | POA: Insufficient documentation

## 2023-08-27 DIAGNOSIS — Z87891 Personal history of nicotine dependence: Secondary | ICD-10-CM | POA: Insufficient documentation

## 2023-08-27 LAB — PULMONARY FUNCTION TEST
DL/VA % pred: 79 %
DL/VA: 3.16 ml/min/mmHg/L
DLCO unc % pred: 72 %
DLCO unc: 15.07 ml/min/mmHg
FEF 25-75 Post: 1.51 L/s
FEF 25-75 Pre: 1.17 L/s
FEF2575-%Change-Post: 29 %
FEF2575-%Pred-Post: 89 %
FEF2575-%Pred-Pre: 69 %
FEV1-%Change-Post: 5 %
FEV1-%Pred-Post: 94 %
FEV1-%Pred-Pre: 89 %
FEV1-Post: 2.16 L
FEV1-Pre: 2.06 L
FEV1FVC-%Change-Post: 0 %
FEV1FVC-%Pred-Pre: 92 %
FEV6-%Change-Post: 4 %
FEV6-%Pred-Post: 106 %
FEV6-%Pred-Pre: 101 %
FEV6-Post: 3.1 L
FEV6-Pre: 2.96 L
FEV6FVC-%Change-Post: 0 %
FEV6FVC-%Pred-Post: 103 %
FEV6FVC-%Pred-Pre: 103 %
FVC-%Change-Post: 5 %
FVC-%Pred-Post: 103 %
FVC-%Pred-Pre: 98 %
FVC-Post: 3.18 L
FVC-Pre: 3.01 L
Post FEV1/FVC ratio: 68 %
Post FEV6/FVC ratio: 98 %
Pre FEV1/FVC ratio: 68 %
Pre FEV6/FVC Ratio: 98 %
RV % pred: 110 %
RV: 2.75 L
TLC % pred: 106 %
TLC: 5.84 L

## 2023-08-27 MED ORDER — ALBUTEROL SULFATE (2.5 MG/3ML) 0.083% IN NEBU
2.5000 mg | INHALATION_SOLUTION | Freq: Once | RESPIRATORY_TRACT | Status: AC
  Administered 2023-08-27: 2.5 mg via RESPIRATORY_TRACT

## 2023-08-28 ENCOUNTER — Ambulatory Visit
Admission: RE | Admit: 2023-08-28 | Discharge: 2023-08-28 | Disposition: A | Discharge status: Home / Self Care | Payer: Medicare HMO | Source: Ambulatory Visit | Attending: Radiation Oncology | Admitting: Radiation Oncology

## 2023-08-28 ENCOUNTER — Encounter (HOSPITAL_COMMUNITY): Payer: Medicare HMO

## 2023-08-28 ENCOUNTER — Encounter: Payer: Self-pay | Admitting: Radiation Oncology

## 2023-08-28 ENCOUNTER — Ambulatory Visit (HOSPITAL_COMMUNITY): Admit: 2023-08-28 | Payer: Medicare HMO | Admitting: Internal Medicine

## 2023-08-28 ENCOUNTER — Encounter: Payer: Self-pay | Admitting: Internal Medicine

## 2023-08-28 ENCOUNTER — Ambulatory Visit (HOSPITAL_COMMUNITY): Payer: Medicare HMO

## 2023-08-28 VITALS — Ht 67.0 in | Wt 153.0 lb

## 2023-08-28 DIAGNOSIS — C7801 Secondary malignant neoplasm of right lung: Secondary | ICD-10-CM

## 2023-08-28 DIAGNOSIS — C83398 Diffuse large b-cell lymphoma of other extranodal and solid organ sites: Secondary | ICD-10-CM | POA: Diagnosis not present

## 2023-08-28 SURGERY — ICD GENERATOR REMOVAL

## 2023-08-28 NOTE — Progress Notes (Signed)
Radiation Oncology         ([phone]) [phone] ________________________________  Initial Outpatient Consultation - Conducted via telephone at patient request.  I spoke with the patient to conduct this consult visit via telephone. The patient was notified in advance and was offered an in person or telemedicine meeting to allow for face to face communication but instead preferred to proceed with a telephone consult.   Name: Rachel Bates        MRN: [id number]  Date of Service: 08/28/2023 DOB: [birth year]  CC:O'Buch, Edgardo Roys, PA-C  Johney Maine, MD     REFERRING PHYSICIAN: Johney Maine, MD   DIAGNOSIS: The encounter diagnosis was Malignant neoplasm metastatic to right lung [facility]).   HISTORY OF PRESENT ILLNESS: Rachel Bates is a 78 y.o. female seen at the request of Dr. Candise Che for a newly diagnosed lung cancer. 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  A biopsy confirmed this in February 2024, and she was counseled on the rationale for systemic treatment, it appeared from her initial PET scan in February 2024 that she had disease in the base of tongue, left lingular region, cervical and supraclavicular lymph nodes, and there was a 7 mm avid nodule within the anterior medial left upper lobe.  She also had increased uptake along the long axis of the posterior right seventh rib with no corresponding finding on CT.  She had prominent retroperitoneal and mesenteric lymph nodes with mild tracer uptake as well.  Metastatic adenopathy could not be excluded in this area.  Her most recent course of chemotherapy was administered between 12/11/22-02/12/23. With her chemotherapy, a PET scan on 12/25/2022 showed complete to near complete metabolic response with no residual adenopathy or focal lesion and persistence of the right upper lobe nodule measuring 5 mm without convincing hypermetabolic  activity.  A CT angio of the chest on 02/14/2023 was performed in the emergency department and showed no evidence of embolism, bilateral lower lobe pneumonia and changes also in the lingula.  There was a persisting right upper lobe nodule measuring 10 mm.  She underwent PET imaging which showed hypermetabolic activity in the right upper lobe and peripheral left upper lobe.  The right upper lobe measured 1.1 cm with an SUV of 4.6, the left upper lobe nodule had an SUV of 5.8 and measured 1 cm.  A follow-up to this by CT without contrast on 04/30/2023 showed a 1.1 cm nodule in the right upper lobe, there was an adjacent nodule laterally measuring 1 cm, and the clustered subpleural nodularity in the left upper lobe was unchanged.  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  A repeat PET scan on 08/24/2023 showed enlarging right upper lobe nodule measuring 2.2 cm with an SUV of 8.5, as well as new right hilar and mediastinal adenopathy with SUVs between 9.1 and 10.  The previously seen hypermetabolic nodule in the left upper lobe has nearly resolved with trace nodularity in faint metabolic activity.  No other evidence of metastatic findings were appreciated.  The patient is scheduled to undergo a CT of the head as she has an indwelling pacemaker.  She is contacted today to discuss treatment recommendations of her cancer.   PREVIOUS RADIATION THERAPY: No   PAST MEDICAL HISTORY:  Past  Medical History:  Diagnosis Date   AICD (automatic cardioverter/defibrillator) present dual   Medtronic ---  original placedment 2007/  generator change 2014 by dr gregg taylor   Anemia    Anticoagulant long-term use    eliquis   Anxiety    Arthralgia of multiple joints    Arthritis pain    Benign hypertensive heart disease    CAD (coronary artery disease) primary cardiologist-- dr gregg  taylor   MI and 2 stents 1995 in [place]   Cancer [facility])    CHF NYHA class II, chronic, systolic (HCC)    followed by dr Sharlot Gowda taylor   Degenerative disc disease, lumbar    Depression    Dyslipidemia    History of basal cell carcinoma (BCC) excision    right ankle area s/p  excision in office 06/ 2019  in office   History of DVT of lower extremity yrs ago before 2012   History of MI (myocardial infarction) 1995  in [place]   History of pulmonary embolus (PE) 2012   History of ventricular tachycardia    Hyperlipidemia    Hypersomnia    Hypertension    Ischemic cardiomyopathy    Migraines    Myocardial infarction (HCC)    OA (osteoarthritis)    "all over"   PAF (paroxysmal atrial fibrillation) (HCC)    Pneumonia    02/2023   Primary localized osteoarthritis of right hip 12/18/2017   Primary localized osteoarthritis of right knee 05/28/2018   S/P coronary artery stent placement 1995   in [facility]   05-21-2018 per pt x2  stents in same coronary artery (unsure BM or DES)   Vitamin D deficiency disease        PAST SURGICAL HISTORY: Past Surgical History:  Procedure Laterality Date   BIOPSY  06/19/2022   Procedure: BIOPSY;  Surgeon: Kathi Der, MD;  Location: [facility] ENDOSCOPY;  Service: Gastroenterology;;   BRONCHIAL BIOPSY  08/07/2023   Procedure: BRONCHIAL BIOPSIES;  Surgeon: Leslye Peer, MD;  Location: [facility] ENDOSCOPY;  Service: Pulmonary;;   BRONCHIAL BRUSHINGS  08/07/2023   Procedure: BRONCHIAL BRUSHINGS;  Surgeon: Leslye Peer, MD;  Location: [facility] ENDOSCOPY;  Service: Pulmonary;;   BRONCHIAL NEEDLE ASPIRATION BIOPSY  08/07/2023   Procedure: BRONCHIAL NEEDLE ASPIRATION BIOPSIES;  Surgeon: Leslye Peer, MD;  Location: [facility] ENDOSCOPY;  Service: Pulmonary;;   CARDIAC DEFIBRILLATOR PLACEMENT  2007   CATARACT EXTRACTION, BILATERAL     COLONOSCOPY  04/14/2013   colonic polyp, status post polypectomy. Mild panocolonic diverticulosis. Small internal hemorrhoids   DILATION AND  CURETTAGE OF UTERUS  yrs ago   DIRECT LARYNGOSCOPY Right 10/04/2022   Procedure: DIRECT LARYNGOSCOPY;  Surgeon: Cheron Schaumann A, DO;  Location: [facility] OR;  Service: ENT;  Laterality: Right;   ESOPHAGOGASTRODUODENOSCOPY (EGD) WITH PROPOFOL N/A 06/19/2022   Procedure: ESOPHAGOGASTRODUODENOSCOPY (EGD) WITH PROPOFOL;  Surgeon: Kathi Der, MD;  Location: [facility] ENDOSCOPY;  Service: Gastroenterology;  Laterality: N/A;   FIDUCIAL MARKER PLACEMENT  08/07/2023   Procedure: FIDUCIAL MARKER PLACEMENT;  Surgeon: Leslye Peer, MD;  Location: [facility] ENDOSCOPY;  Service: Pulmonary;;   IMPACTION REMOVAL  06/19/2022   Procedure: IMPACTION REMOVAL;  Surgeon: Kathi Der, MD;  Location: [facility] ENDOSCOPY;  Service: Gastroenterology;;   IMPLANTABLE CARDIOVERTER DEFIBRILLATOR GENERATOR CHANGE N/A 02/04/2013   Procedure: IMPLANTABLE CARDIOVERTER DEFIBRILLATOR GENERATOR CHANGE;  Surgeon: Marinus Maw, MD;  Location: [facility] CATH LAB;  Service: Cardiovascular;  Laterality: N/A;   IR IMAGING GUIDED PORT INSERTION  10/27/2022   LYMPH NODE BIOPSY  Right 10/04/2022   Procedure: EXCISIONAL OF RIGHT DEEP CERVICAL LYMPH NODE;  Surgeon: Laren Boom, DO;  Location: [facility] OR;  Service: ENT;  Laterality: Right;   SHOULDER ARTHROSCOPY Right 2015   TOTAL HIP ARTHROPLASTY Right 12/18/2017   Procedure: RIGHT TOTAL HIP ARTHROPLASTY;  Surgeon: Teryl Lucy, MD;  Location: [facility] OR;  Service: Orthopedics;  Laterality: Right;   TOTAL HIP ARTHROPLASTY Left 04/19/2021   Procedure: TOTAL HIP ARTHROPLASTY;  Surgeon: Teryl Lucy, MD;  Location: [facility] ORS;  Service: Orthopedics;  Laterality: Left;   TOTAL KNEE ARTHROPLASTY Right 05/28/2018   Procedure: TOTAL KNEE ARTHROPLASTY;  Surgeon: Teryl Lucy, MD;  Location: [facility] ORS;  Service: Orthopedics;  Laterality: Right;  Adductor Block   TOTAL KNEE ARTHROPLASTY Left 08/12/2021   Procedure: TOTAL KNEE ARTHROPLASTY;  Surgeon: Teryl Lucy, MD;  Location: [facility] ORS;  Service: Orthopedics;  Laterality:  Left;   TUBAL LIGATION Bilateral yrs ago   VIDEO BRONCHOSCOPY WITH RADIAL ENDOBRONCHIAL ULTRASOUND  08/07/2023   Procedure: VIDEO BRONCHOSCOPY WITH RADIAL ENDOBRONCHIAL ULTRASOUND;  Surgeon: Leslye Peer, MD;  Location: [facility] ENDOSCOPY;  Service: Pulmonary;;   WISDOM TOOTH EXTRACTION       FAMILY HISTORY:  Family History  Problem Relation Age of Onset   Hypertension Mother    Thyroid disease Mother    Alzheimer's disease Mother    Coronary artery disease Father    Pulmonary embolism Father    Congestive Heart Failure Maternal Grandmother    Hypertension Maternal Grandmother    Heart attack Maternal Grandfather    Other Maternal Grandfather        carotid disease   Dementia Paternal Grandmother    Other Paternal Grandfather 46       accident     SOCIAL HISTORY:  reports that she quit smoking about 27 years ago. Her smoking use included cigarettes. She started smoking about 57 years ago. She has never used smokeless tobacco. She reports that she does not drink alcohol and does not use drugs. The patient is single and lives in [place].   ALLERGIES: Sumatriptan succinate and Amitriptyline   MEDICATIONS:  Current Outpatient Medications  Medication Sig Dispense Refill   acetaminophen (TYLENOL) 650 MG CR tablet Take 1,300 mg by mouth every 8 (eight) hours as needed for pain.     amoxicillin-clavulanate (AUGMENTIN) 875-125 MG tablet Take 1 tablet by mouth 2 (two) times daily. 20 tablet 0   apixaban (ELIQUIS) 5 MG TABS tablet Take 1 tablet (5 mg total) by mouth 2 (two) times daily. Okay to restart this medication on 08/08/2023     b complex vitamins capsule Take 1 capsule by mouth daily.     Calcium Carb-Cholecalciferol (CALCIUM 600 + D PO) Take 1 tablet by mouth daily.     carvedilol (COREG) 12.5 MG tablet Take 12.5 mg by mouth 2 (two) times daily with a meal.      Cholecalciferol (VITAMIN D3) 125 MCG (5000 UT) capsule Take 5,000 Units by mouth daily.     cyanocobalamin (VITAMIN  B12) 1000 MCG tablet Take 1,000 mcg by mouth daily.     FIBER PO Take 1 capsule by mouth 2 (two) times daily.     Fiber POWD Take 1 Scoop by mouth daily.     gabapentin (NEURONTIN) 300 MG capsule Take 300 mg by mouth 3 (three) times daily.     loratadine (CLARITIN) 10 MG tablet Take 10 mg by mouth daily.     Omega-3 Fatty Acids (FISH OIL) 1000 MG CAPS Take  1,000 mg by mouth daily.      ramipril (ALTACE) 2.5 MG tablet Take 2.5 mg by mouth daily.       simvastatin (ZOCOR) 40 MG tablet Take 40 mg by mouth at bedtime.       tiZANidine (ZANAFLEX) 4 MG tablet Take 2 mg by mouth daily as needed for muscle spasms.     traZODone (DESYREL) 50 MG tablet Take 50 mg by mouth at bedtime.      venlafaxine (EFFEXOR) 75 MG tablet Take 75 mg by mouth daily.     vitamin E 400 UNIT capsule Take 400 Units by mouth daily.     zonisamide (ZONEGRAN) 50 MG capsule Take 50-100 mg by mouth See admin instructions. Take 50mg  by mouth in the morning and 100mg  at night.     No current facility-administered medications for this encounter.     REVIEW OF SYSTEMS: On review of systems, the patient reports that she is doing well overall. She did not have problems with cough or hemoptysis following bronchoscopy. She has had shortness of breath with exertion and mild dizziness when standing up quickly. She is planning to have her cardiac device removed. The defibrillator has been turned off, and her pacer has not been used in the time Dr. Ladona Ridgel changed the settings so it will be removed in the near future and it remains in the left chest. No other complaints are verbalized.      PHYSICAL EXAM:  Unable to assess due to encounter type    ECOG = 1  0 - Asymptomatic (Fully active, able to carry on all predisease activities without restriction)  1 - Symptomatic but completely ambulatory (Restricted in physically strenuous activity but ambulatory and able to carry out work of a light or sedentary nature. For example, light  housework, office work)  2 - Symptomatic, <50% in bed during the day (Ambulatory and capable of all self care but unable to carry out any work activities. Up and about more than 50% of waking hours)  3 - Symptomatic, >50% in bed, but not bedbound (Capable of only limited self-care, confined to bed or chair 50% or more of waking hours)  4 - Bedbound (Completely disabled. Cannot carry on any self-care. Totally confined to bed or chair)  5 - Death   Santiago Glad MM, Creech RH, Tormey DC, et al. [phone]). "Toxicity and response criteria of the [facility]". Am. Evlyn Clines. Oncol. 5 (6): 649-55    LABORATORY DATA:  Lab Results  Component Value Date   WBC 4.0 08/03/2023   HGB 14.3 08/03/2023   HCT 41.8 08/03/2023   MCV 88.4 08/03/2023   PLT 107 (L) 08/03/2023   Lab Results  Component Value Date   NA 141 08/03/2023   K 4.3 08/03/2023   CL 104 08/03/2023   CO2 31 08/03/2023   Lab Results  Component Value Date   ALT 19 08/03/2023   AST 24 08/03/2023   ALKPHOS 82 08/03/2023   BILITOT 0.7 08/03/2023      RADIOGRAPHY: NM PET Image Restag (PS) Skull Base To Thigh Result Date: 08/28/2023 CLINICAL DATA:  Subsequent treatment strategy for non-small cell lung cancer. EXAM: NUCLEAR MEDICINE PET SKULL BASE TO THIGH TECHNIQUE: 7.8 mCi F-18 FDG was injected intravenously. Full-ring PET imaging was performed from the skull base to thigh after the radiotracer. CT data was obtained and used for attenuation correction and anatomic localization. Fasting blood glucose: 105 mg/dl COMPARISON:  CT 96/10/5407, PET-CT 02/10/2023 FINDINGS: NECK:  No hypermetabolic lymph nodes in the neck. Incidental CT findings: None. CHEST: Enlarging RIGHT upper lobe nodule measures 2.2 cm with SUV max equal 8.5. There are new RIGHT hilar and mediastinal adenopathy. For example RIGHT hilar node with SUV max equal 10.0 on image 61. Hypermetabolic subcarinal and RIGHT lower paratracheal lymph node. Example subcarinal  node with SUV max 9.1. No supraclavicular adenopathy. Previously seen hypermetabolic nodule in the lateral aspect of the LEFT upper lobe has near completely resolved with trace nodularity in faint metabolic activity (image 56). Incidental CT findings: None. ABDOMEN/PELVIS: No abnormal hypermetabolic activity within the liver, pancreas, adrenal glands, or spleen. No hypermetabolic lymph nodes in the abdomen or pelvis. Incidental CT findings: None. SKELETON: No focal hypermetabolic activity to suggest skeletal metastasis. Incidental CT findings: Bilateral hip prosthetic IMPRESSION: 1. Enlarging hypermetabolic RIGHT upper lobe nodule consistent with bronchogenic carcinoma. 2. New hypermetabolic RIGHT hilar and mediastinal adenopathy consistent with nodal metastasis. 3. Near complete resolution of hypermetabolic nodule in the LEFT upper lobe. 4. No evidence of metastatic disease in the abdomen pelvis. Electronically Signed   By: Genevive Bi M.D.   On: 08/28/2023 11:51   CT Super D Chest Wo Contrast Result Date: 08/12/2023 CLINICAL DATA:  Follow-up right upper lobe pulmonary nodule. History of lymphoma. Preop for EMB and biopsy. * Tracking Code: BO * EXAM: CT CHEST WITHOUT CONTRAST TECHNIQUE: Multidetector CT imaging of the chest was performed using thin slice collimation for electromagnetic bronchoscopy planning purposes, without intravenous contrast. RADIATION DOSE REDUCTION: This exam was performed according to the departmental dose-optimization program which includes automated exposure control, adjustment of the mA and/or kV according to patient size and/or use of iterative reconstruction technique. COMPARISON:  Chest CT 04/30/2023. FINDINGS: Cardiovascular: The heart is normal in size. No pericardial effusion. 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 Stable pacer wires. Stable atherosclerotic calcification involving the aorta and coronary arteries.  Mediastinum/Nodes: Progressive enlargement of mediastinal nodes. Right precarinal node measures 10 mm and previously measured 4 mm. Subcarinal node measures 10 mm and previously measured 4 mm. The esophagus is grossly normal. Lungs/Pleura: Interval significant enlargement of the branching mass in the right upper lobe partially obstructing the right upper lobe bronchus. This measures approximately 2.8 x 1.7 cm on image 57/4. Findings suggest progressive endobronchial tumor. Stable tree-in-bud type nodularity in the left upper lobe. No new or progressive changes. Stable bibasilar scarring changes and underlying emphysema. No pleural effusions or pleural nodules. Upper Abdomen: No significant upper abdominal findings. No hepatic or adrenal gland lesions. Stable vascular calcifications. Musculoskeletal: No significant bony findings. IMPRESSION: 1. Interval significant enlargement of the branching mass in the right upper lobe partially obstructing the right upper lobe bronchus. Findings suggest progressive endobronchial tumor. 2. Progressive enlargement of mediastinal nodes worrisome for metastatic adenopathy. 3. Stable tree-in-bud type nodularity in the left upper lobe. No new or progressive changes. 4. 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 5. Aortic atherosclerosis. Electronically Signed   By: Rudie Meyer M.D.   On: 08/12/2023 14:06   DG Chest Port 1 View Result Date: 08/07/2023 CLINICAL DATA:  [id number] S/P bronchoscopy with biopsy [id number] EXAM: PORTABLE CHEST 1 VIEW COMPARISON:  08/03/2023 FINDINGS: Right chest port and left-sided implanted cardiac device remain in place. Heart size within normal limits. Aortic atherosclerosis. Nodular density in the right perihilar region with fiducial marker. Lungs are otherwise clear. No pleural effusion or pneumothorax. IMPRESSION: 1. No pneumothorax. 2. Nodular density in the right perihilar  region with fiducial marker.  Electronically Signed   By: Duanne Guess D.O.   On: 08/07/2023 11:41   DG C-ARM BRONCHOSCOPY Result Date: 08/07/2023 C-ARM BRONCHOSCOPY: Fluoroscopy was utilized by the requesting physician.  No radiographic interpretation.       IMPRESSION/PLAN: 1. Stage IIIA, cT1cN2M0, NSCLC, squamous cell carcinoma of the RUL. Dr. Mitzi Hansen has reviewed her imaging and pathology findings and today she and I discussed the nature of locally advanced lung cancer. She will have a CT head since she still has her implanted cardiac device, and if negative, she would be offered chemoradiation. We discussed the risks, benefits, short, and long term effects of radiotherapy, as well as the curative intent, and the patient is interested in proceeding. We reviewed the delivery and logistics of radiotherapy and Dr. Mitzi Hansen would offer a course of 6 1/2 weeks of radiotherapy. She is interested in treatment however closer to home at [facility] in [place]. I let her know I'd message Dr. Candise Che and refer her urgently to meet Dr. Thersa Salt. 2. Implanted Cardiac  Device. The patient has a defibrillator and pacemaker, the defibrillator is turned off, and since the pacer is no being used, the plan is to remove this in the near future. I told her I did not think the plan to remove would interfere with the ability to plan her radiation since it is no longer needed.    This encounter was conducted via telephone.  The patient has provided two factor identification and has given verbal consent for this type of encounter and has been advised to only accept a meeting of this type in a secure network environment. The time spent during this encounter was 60 minutes including preparation, discussion, and coordination of the patient's care. The attendants for this meeting include  Ronny Bacon  and Alfonse Alpers.  During the encounter,  Ronny Bacon was located at [facility].   Alfonse Alpers was located at home.      Osker Mason, [facility]   **Disclaimer: This note was dictated with voice recognition software. Similar sounding words can inadvertently be transcribed and this note may contain transcription errors which may not have been corrected upon publication of note.**

## 2023-08-28 NOTE — Progress Notes (Signed)
TO BE COMPLETED BY RADIATION ONCOLOGIST OFFICE:   Patient Name: Rachel Bates   Date of Birth: 03/17/1946   Radiation Oncologist: Dr. Dorothy Puffer   Site to be Treated: RUL  Will x-rays >10 MV be used? No  Will the radiation be >10 cm from the device? Yes  Planned Treatment Start Date: 08/30/2023  TO BE COMPLETED BY CARDIOLOGIST OFFICE:   Device Information:  Pacemaker []      ICD [x]    Brand: Medtronic: [phone] Model #: Medtronic [id number] Lianne Moris DR  Serial Number: [id number]     Date of Placement:02/04/2013  Site of Placement: chest  Remote Device Check--Frequency: Every 91 days   Last Check: 07/27/2023  Is the Patient Pacer Dependent?:  Yes []   No [x]   Does cardiologist request Radiation Oncology to schedule device testing by vendor for the following:  Prior to the Initiation of Treatments?  Yes []  No [x]  During Treatments?  Yes []  No [x]  Post Radiation Treatments?  Yes []  No [x]   Is device monitoring necessary by vendor/cardiologist team during treatments?  Yes []   No [x]   Is cardiac monitoring by Radiation Oncology nursing necessary during treatments? Yes []   No [x]   Do you recommend device be relocated prior to Radiation Treatment? Yes []   No [x]   **PLEASE LIST ANY NOTES OR SPECIAL REQUESTS:       CARDIOLOGIST SIGNATURE:  Dr. Lewayne Bunting Per Device Clinic Standing Orders, Lenor Coffin  08/28/2023 4:45 PM  **Please route completed form back to Radiation Oncology Nursing and "P [facility] RAD ONC ADMIN", OR send an update if there will be a delay in having form completed by expected start date.  **Call [phone] if you have any questions or do not get an in-basket response from a Radiation Oncology staff member

## 2023-08-28 NOTE — Progress Notes (Addendum)
Thoracic Location of Tumor / Histology: RUL  Patient presented 8 months ago with symptoms of: Nausea, indigestion, and dyspnea upon inspiration.  Biopsies of RUL (if applicable) revealed: FINAL MICROSCOPIC DIAGNOSIS:  A. LUNG, RUL, NEEDLE ASPIRATIONS  BIOPSIES:  - Squamous cell carcinoma.   B. LUNG, RUL, BRUSHING:  - Squamous cell carcinoma.   Past/Anticipated interventions by cardiothoracic surgery, if any: no  Past/Anticipated interventions by medical oncology, if any: To f/u w/ Dr. Mitzi Hansen in Radiation Oncology. ChemoRT- yes 6 1/2 weeks to the RT lung.  Tobacco/Marijuana/Snuff/ETOH use: no-Quit 1997  Signs/Symptoms Weight changes, if any: no Respiratory complaints, if any: Mild SOB w/ over exertion, and mild dizziness upon standing up too fast. Hemoptysis, if any: no Pain issues, if any:  no  SAFETY ISSUES: Prior radiation? No Pacemaker/ICD? Yes- LT side Possible current pregnancy?no Is the patient on methotrexate? no  Current Complaints / other details:  None   Pacer monitoring form has been sent but patient is currently rescheduling a date to have it removed (pending).  This concludes the interaction.  Ruel Favors, LPN

## 2023-08-29 ENCOUNTER — Ambulatory Visit (HOSPITAL_COMMUNITY): Payer: Medicare HMO

## 2023-08-30 ENCOUNTER — Ambulatory Visit: Payer: Medicare HMO | Admitting: Radiation Oncology

## 2023-08-30 ENCOUNTER — Ambulatory Visit: Admission: RE | Admit: 2023-08-30 | Payer: Medicare HMO | Source: Ambulatory Visit

## 2023-08-30 ENCOUNTER — Ambulatory Visit (HOSPITAL_COMMUNITY): Payer: Medicare HMO

## 2023-08-30 ENCOUNTER — Ambulatory Visit: Payer: Medicare HMO

## 2023-08-31 ENCOUNTER — Telehealth: Payer: Self-pay | Admitting: Radiation Oncology

## 2023-08-31 NOTE — Telephone Encounter (Signed)
2/21 Left voicemail with Dr. Bernerd Pho referral coordinator to confirm referral and doctor's notes received and patient date/time schedule.  Waiting on call back to confirm.

## 2023-09-03 ENCOUNTER — Telehealth: Payer: Self-pay | Admitting: Radiation Oncology

## 2023-09-03 ENCOUNTER — Encounter (HOSPITAL_COMMUNITY): Payer: Self-pay

## 2023-09-03 ENCOUNTER — Ambulatory Visit (HOSPITAL_COMMUNITY)
Admission: RE | Admit: 2023-09-03 | Discharge: 2023-09-03 | Disposition: A | Discharge status: Home / Self Care | Payer: Medicare HMO | Source: Ambulatory Visit | Attending: Acute Care | Admitting: Acute Care

## 2023-09-03 DIAGNOSIS — C8590 Non-Hodgkin lymphoma, unspecified, unspecified site: Secondary | ICD-10-CM | POA: Diagnosis not present

## 2023-09-03 DIAGNOSIS — Z8572 Personal history of non-Hodgkin lymphomas: Secondary | ICD-10-CM | POA: Diagnosis not present

## 2023-09-03 DIAGNOSIS — M898X8 Other specified disorders of bone, other site: Secondary | ICD-10-CM | POA: Diagnosis not present

## 2023-09-03 DIAGNOSIS — I671 Cerebral aneurysm, nonruptured: Secondary | ICD-10-CM | POA: Diagnosis not present

## 2023-09-03 DIAGNOSIS — C3490 Malignant neoplasm of unspecified part of unspecified bronchus or lung: Secondary | ICD-10-CM | POA: Diagnosis not present

## 2023-09-03 MED ORDER — IOHEXOL 300 MG/ML  SOLN
75.0000 mL | Freq: Once | INTRAMUSCULAR | Status: AC | PRN
  Administered 2023-09-03: 75 mL via INTRAVENOUS

## 2023-09-03 NOTE — Telephone Encounter (Signed)
 2/24 Follow up call to Dr. Bernerd Pho office, spoke to [facility] (Referral Coordinator).  Patient is now scheduled for 2/26 with Dr. Thersa Salt.  Inbasket sent to Laurence Aly and copied Darryl Nestle, so they are aware.

## 2023-09-04 ENCOUNTER — Ambulatory Visit: Payer: Medicare Other

## 2023-09-04 ENCOUNTER — Telehealth: Payer: Self-pay

## 2023-09-04 NOTE — Telephone Encounter (Signed)
 Spoke with Pt. She would like to have the cannula removed and the leads capped. She is going today for her first appointment with radiology. She will follow up once she knows what the plan with them is.

## 2023-09-05 ENCOUNTER — Ambulatory Visit
Admission: RE | Admit: 2023-09-05 | Discharge: 2023-09-05 | Disposition: A | Discharge status: Home / Self Care | Payer: Medicare HMO | Source: Ambulatory Visit | Attending: Radiation Oncology | Admitting: Radiation Oncology

## 2023-09-05 ENCOUNTER — Encounter: Payer: Self-pay | Admitting: Radiation Oncology

## 2023-09-05 VITALS — BP 122/58 | HR 87 | Temp 98.2°F | Resp 18 | Ht 66.73 in | Wt 157.2 lb

## 2023-09-05 DIAGNOSIS — R590 Localized enlarged lymph nodes: Secondary | ICD-10-CM | POA: Insufficient documentation

## 2023-09-05 DIAGNOSIS — E785 Hyperlipidemia, unspecified: Secondary | ICD-10-CM | POA: Insufficient documentation

## 2023-09-05 DIAGNOSIS — Z86711 Personal history of pulmonary embolism: Secondary | ICD-10-CM | POA: Insufficient documentation

## 2023-09-05 DIAGNOSIS — K209 Esophagitis, unspecified without bleeding: Secondary | ICD-10-CM | POA: Insufficient documentation

## 2023-09-05 DIAGNOSIS — Z79899 Other long term (current) drug therapy: Secondary | ICD-10-CM | POA: Insufficient documentation

## 2023-09-05 DIAGNOSIS — M1711 Unilateral primary osteoarthritis, right knee: Secondary | ICD-10-CM | POA: Diagnosis not present

## 2023-09-05 DIAGNOSIS — Z85828 Personal history of other malignant neoplasm of skin: Secondary | ICD-10-CM | POA: Diagnosis not present

## 2023-09-05 DIAGNOSIS — Z7901 Long term (current) use of anticoagulants: Secondary | ICD-10-CM | POA: Diagnosis not present

## 2023-09-05 DIAGNOSIS — Z95810 Presence of automatic (implantable) cardiac defibrillator: Secondary | ICD-10-CM | POA: Diagnosis not present

## 2023-09-05 DIAGNOSIS — Z87891 Personal history of nicotine dependence: Secondary | ICD-10-CM | POA: Insufficient documentation

## 2023-09-05 DIAGNOSIS — I2510 Atherosclerotic heart disease of native coronary artery without angina pectoris: Secondary | ICD-10-CM | POA: Diagnosis not present

## 2023-09-05 DIAGNOSIS — I252 Old myocardial infarction: Secondary | ICD-10-CM | POA: Diagnosis not present

## 2023-09-05 DIAGNOSIS — C3411 Malignant neoplasm of upper lobe, right bronchus or lung: Secondary | ICD-10-CM | POA: Diagnosis not present

## 2023-09-05 DIAGNOSIS — I255 Ischemic cardiomyopathy: Secondary | ICD-10-CM | POA: Diagnosis not present

## 2023-09-05 DIAGNOSIS — C7801 Secondary malignant neoplasm of right lung: Secondary | ICD-10-CM | POA: Diagnosis not present

## 2023-09-05 DIAGNOSIS — C83398 Diffuse large b-cell lymphoma of other extranodal and solid organ sites: Secondary | ICD-10-CM | POA: Diagnosis not present

## 2023-09-05 DIAGNOSIS — I480 Paroxysmal atrial fibrillation: Secondary | ICD-10-CM | POA: Insufficient documentation

## 2023-09-05 DIAGNOSIS — I110 Hypertensive heart disease with heart failure: Secondary | ICD-10-CM | POA: Insufficient documentation

## 2023-09-05 DIAGNOSIS — Z86718 Personal history of other venous thrombosis and embolism: Secondary | ICD-10-CM | POA: Diagnosis not present

## 2023-09-06 ENCOUNTER — Other Ambulatory Visit: Payer: Self-pay | Admitting: Hematology

## 2023-09-06 ENCOUNTER — Telehealth: Payer: Self-pay | Admitting: Hematology

## 2023-09-06 ENCOUNTER — Encounter: Payer: Self-pay | Admitting: Hematology

## 2023-09-06 ENCOUNTER — Telehealth: Payer: Self-pay | Admitting: Oncology

## 2023-09-06 DIAGNOSIS — C83398 Diffuse large b-cell lymphoma of other extranodal and solid organ sites: Secondary | ICD-10-CM | POA: Diagnosis not present

## 2023-09-06 DIAGNOSIS — C7801 Secondary malignant neoplasm of right lung: Secondary | ICD-10-CM | POA: Diagnosis not present

## 2023-09-06 NOTE — Telephone Encounter (Signed)
 09/06/23 Patient cancelled referral appt and stated she wants to keep appts in [facility]

## 2023-09-06 NOTE — Telephone Encounter (Signed)
 Patient has been seen by Dr. 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 She lives in [place] and would like to have radiation therapy there. She also feels that receiving her weekly CarboTaxol chemotherapy with concurrent radiation followed by adjuvant immunotherapy might be more convenient closer to home and she has an appointment with Dr. Melvyn Neth at [facility]. At this time we will transfer her medical oncology continued care to Dr. Melvyn Neth.  Patient is aware that she could call us if any new questions or concerns arise but that medical oncology cares will be taken over by Dr. Melvyn Neth at this time. Patient is appreciate of all her care with [place] thus far. I called her again today to confirm that there were no other questions or concerns that she had and that I did not have to set up chemotherapy. Her appointment with Dr. Melvyn Neth is on 09/14/2023.  Wyvonnia Lora MD MS

## 2023-09-07 DIAGNOSIS — C7801 Secondary malignant neoplasm of right lung: Secondary | ICD-10-CM | POA: Diagnosis not present

## 2023-09-07 DIAGNOSIS — C83398 Diffuse large b-cell lymphoma of other extranodal and solid organ sites: Secondary | ICD-10-CM | POA: Diagnosis not present

## 2023-09-07 NOTE — Progress Notes (Signed)
 Radiation Oncology         [phone] ________________________________  Name: Rachel Bates        MRN: [id number]  Date of Service: 09/05/2023 DOB: 1946-05-25  CC:O'Buch, Greta, PA-C  O'Buch, Greta, PA-C     REFERRING PHYSICIAN: Johney Maine, MD   DIAGNOSIS: Squamous cell carcinoma of the right upper lobe   HISTORY OF PRESENT ILLNESS: Rachel Bates is a 78 y.o. female seen today regarding her recent diagnosis of squamous cell carcinoma of the right upper lung.  Bronchoscopy performed in late January revealed squamous cell carcinoma in her right lung.  PET/CT imaging performed in mid February revealed an enlarging right upper lobe nodule, with right hilar and mediastinal lymphadenopathy.  She has been seen in consultation by Dr. Mitzi Hansen in [place], and external beam thoracic radiation was discussed.  She lives in [place], she asked that her care be transferred to our facility.   PREVIOUS RADIATION THERAPY: No   PAST MEDICAL HISTORY:  Past Medical History:  Diagnosis Date   AICD (automatic cardioverter/defibrillator) present dual   [employer] ---  original placedment 2007/  generator change 2014 by dr gregg taylor   Anemia    Anticoagulant long-term use    eliquis   Anxiety    Arthralgia of multiple joints    Arthritis pain    Benign hypertensive heart disease    CAD (coronary artery disease) primary cardiologist-- dr gregg taylor   MI and 2 stents 1995 in [place]   Cancer [facility])    CHF NYHA class II, chronic, systolic (HCC)    followed by dr Sharlot Gowda taylor   Degenerative disc disease, lumbar    Depression    Dyslipidemia    History of basal cell carcinoma (BCC) excision    right ankle area s/p  excision in office 06/ 2019  in office   History of DVT of lower extremity yrs ago before 2012   History of MI (myocardial infarction) 1995  in [place]   History of pulmonary embolus (PE) 2012   History of ventricular tachycardia    Hyperlipidemia    Hypersomnia     Hypertension    Ischemic cardiomyopathy    Migraines    Myocardial infarction (HCC)    OA (osteoarthritis)    "all over"   PAF (paroxysmal atrial fibrillation) (HCC)    Pneumonia    02/2023   Primary localized osteoarthritis of right hip 12/18/2017   Primary localized osteoarthritis of right knee 05/28/2018   S/P coronary artery stent placement 1995   in [facility]   05-21-2018 per pt x2  stents in same coronary artery (unsure BM or DES)   Vitamin D deficiency disease        PAST SURGICAL HISTORY: Past Surgical History:  Procedure Laterality Date   BIOPSY  06/19/2022   Procedure: BIOPSY;  Surgeon: Kathi Der, MD;  Location: [facility] ENDOSCOPY;  Service: Gastroenterology;;   BRONCHIAL BIOPSY  08/07/2023   Procedure: BRONCHIAL BIOPSIES;  Surgeon: Leslye Peer, MD;  Location: [facility] ENDOSCOPY;  Service: Pulmonary;;   BRONCHIAL BRUSHINGS  08/07/2023   Procedure: BRONCHIAL BRUSHINGS;  Surgeon: Leslye Peer, MD;  Location: [facility] ENDOSCOPY;  Service: Pulmonary;;   BRONCHIAL NEEDLE ASPIRATION BIOPSY  08/07/2023   Procedure: BRONCHIAL NEEDLE ASPIRATION BIOPSIES;  Surgeon: Leslye Peer, MD;  Location: [facility] ENDOSCOPY;  Service: Pulmonary;;   CARDIAC DEFIBRILLATOR PLACEMENT  2007   CATARACT EXTRACTION, BILATERAL     COLONOSCOPY  04/14/2013   colonic polyp, status  post polypectomy. Mild panocolonic diverticulosis. Small internal hemorrhoids   DILATION AND CURETTAGE OF UTERUS  yrs ago   DIRECT LARYNGOSCOPY Right 10/04/2022   Procedure: DIRECT LARYNGOSCOPY;  Surgeon: Cheron Schaumann A, DO;  Location: [facility] OR;  Service: ENT;  Laterality: Right;   ESOPHAGOGASTRODUODENOSCOPY (EGD) WITH PROPOFOL N/A 06/19/2022   Procedure: ESOPHAGOGASTRODUODENOSCOPY (EGD) WITH PROPOFOL;  Surgeon: Kathi Der, MD;  Location: [facility] ENDOSCOPY;  Service: Gastroenterology;  Laterality: N/A;   FIDUCIAL MARKER PLACEMENT  08/07/2023   Procedure: FIDUCIAL MARKER PLACEMENT;  Surgeon: Leslye Peer, MD;  Location: [facility] ENDOSCOPY;   Service: Pulmonary;;   IMPACTION REMOVAL  06/19/2022   Procedure: IMPACTION REMOVAL;  Surgeon: Kathi Der, MD;  Location: [facility] ENDOSCOPY;  Service: Gastroenterology;;   IMPLANTABLE CARDIOVERTER DEFIBRILLATOR GENERATOR CHANGE N/A 02/04/2013   Procedure: IMPLANTABLE CARDIOVERTER DEFIBRILLATOR GENERATOR CHANGE;  Surgeon: Marinus Maw, MD;  Location: [facility] CATH LAB;  Service: Cardiovascular;  Laterality: N/A;   IR IMAGING GUIDED PORT INSERTION  10/27/2022   LYMPH NODE BIOPSY Right 10/04/2022   Procedure: EXCISIONAL OF RIGHT DEEP CERVICAL LYMPH NODE;  Surgeon: Laren Boom, DO;  Location: [facility] OR;  Service: ENT;  Laterality: Right;   SHOULDER ARTHROSCOPY Right 2015   TOTAL HIP ARTHROPLASTY Right 12/18/2017   Procedure: RIGHT TOTAL HIP ARTHROPLASTY;  Surgeon: Teryl Lucy, MD;  Location: [facility] OR;  Service: Orthopedics;  Laterality: Right;   TOTAL HIP ARTHROPLASTY Left 04/19/2021   Procedure: TOTAL HIP ARTHROPLASTY;  Surgeon: Teryl Lucy, MD;  Location: [facility] ORS;  Service: Orthopedics;  Laterality: Left;   TOTAL KNEE ARTHROPLASTY Right 05/28/2018   Procedure: TOTAL KNEE ARTHROPLASTY;  Surgeon: Teryl Lucy, MD;  Location: [facility] ORS;  Service: Orthopedics;  Laterality: Right;  Adductor Block   TOTAL KNEE ARTHROPLASTY Left 08/12/2021   Procedure: TOTAL KNEE ARTHROPLASTY;  Surgeon: Teryl Lucy, MD;  Location: [facility] ORS;  Service: Orthopedics;  Laterality: Left;   TUBAL LIGATION Bilateral yrs ago   VIDEO BRONCHOSCOPY WITH RADIAL ENDOBRONCHIAL ULTRASOUND  08/07/2023   Procedure: VIDEO BRONCHOSCOPY WITH RADIAL ENDOBRONCHIAL ULTRASOUND;  Surgeon: Leslye Peer, MD;  Location: [facility] ENDOSCOPY;  Service: Pulmonary;;   WISDOM TOOTH EXTRACTION       FAMILY HISTORY:  Family History  Problem Relation Age of Onset   Hypertension Mother    Thyroid disease Mother    Alzheimer's disease Mother    Coronary artery disease Father    Pulmonary embolism Father    Congestive Heart Failure Maternal Grandmother     Hypertension Maternal Grandmother    Heart attack Maternal Grandfather    Other Maternal Grandfather        carotid disease   Dementia Paternal Grandmother    Other Paternal Grandfather 16       accident     SOCIAL HISTORY:  reports that she quit smoking about 27 years ago. Her smoking use included cigarettes. She started smoking about 57 years ago. She has never used smokeless tobacco. She reports that she does not drink alcohol and does not use drugs.  She is seen today with her daughter.   ALLERGIES: Sumatriptan succinate and Amitriptyline   MEDICATIONS:  Current Outpatient Medications  Medication Sig Dispense Refill   acetaminophen (TYLENOL) 650 MG CR tablet Take 1,300 mg by mouth every 8 (eight) hours as needed for pain.     apixaban (ELIQUIS) 5 MG TABS tablet Take 1 tablet (5 mg total) by mouth 2 (two) times daily. Okay to restart this medication on 08/08/2023     b complex vitamins  capsule Take 1 capsule by mouth daily.     Calcium Carb-Cholecalciferol (CALCIUM 600 + D PO) Take 1 tablet by mouth daily.     carvedilol (COREG) 12.5 MG tablet Take 12.5 mg by mouth 2 (two) times daily with a meal.      Cholecalciferol (VITAMIN D3) 125 MCG (5000 UT) capsule Take 5,000 Units by mouth daily.     cyanocobalamin (VITAMIN B12) 1000 MCG tablet Take 1,000 mcg by mouth daily.     FIBER PO Take 1 capsule by mouth 2 (two) times daily.     Fiber POWD Take 1 Scoop by mouth daily.     gabapentin (NEURONTIN) 300 MG capsule Take 300 mg by mouth 3 (three) times daily.     loratadine (CLARITIN) 10 MG tablet Take 10 mg by mouth daily.     Omega-3 Fatty Acids (FISH OIL) 1000 MG CAPS Take 1,000 mg by mouth daily.      ramipril (ALTACE) 2.5 MG tablet Take 2.5 mg by mouth daily.       simvastatin (ZOCOR) 40 MG tablet Take 40 mg by mouth at bedtime.       tiZANidine (ZANAFLEX) 4 MG tablet Take 2 mg by mouth daily as needed for muscle spasms.     traZODone (DESYREL) 50 MG tablet Take 50 mg by mouth at  bedtime.      venlafaxine (EFFEXOR) 75 MG tablet Take 75 mg by mouth daily.     vitamin E 400 UNIT capsule Take 400 Units by mouth daily.     zonisamide (ZONEGRAN) 50 MG capsule Take 50-100 mg by mouth See admin instructions. Take 50mg  by mouth in the morning and 100mg  at night.     No current facility-administered medications for this encounter.     REVIEW OF SYSTEMS: She reports that she is doing well overall.  She reports no recent changes in her energy level or appetite.  She denies recent weight loss.  She reports no fevers or chills.    PHYSICAL EXAM:  Wt Readings from Last 3 Encounters:  09/05/23 157 lb 3.2 oz (71.3 kg)  08/28/23 153 lb (69.4 kg)  08/14/23 155 lb 12.8 oz (70.7 kg)   Temp Readings from Last 3 Encounters:  09/05/23 98.2 F (36.8 C) (Oral)  08/07/23 98.4 F (36.9 C)  08/03/23 (!) 97 F (36.1 C) (Temporal)   BP Readings from Last 3 Encounters:  09/05/23 (!) 122/58  08/14/23 128/70  08/07/23 (!) 108/58   Pulse Readings from Last 3 Encounters:  09/05/23 87  08/14/23 78  08/07/23 68   Pain Assessment Pain Score: 0-No pain/10  She is an alert female in no apparent distress.  No cervical or supraclavicular lymphadenopathy is appreciated.  Heart demonstrates a regular rate and rhythm.  Her extremities are without edema.    ECOG = 0  0 - Asymptomatic (Fully active, able to carry on all predisease activities without restriction)  1 - Symptomatic but completely ambulatory (Restricted in physically strenuous activity but ambulatory and able to carry out work of a light or sedentary nature. For example, light housework, office work)  2 - Symptomatic, <50% in bed during the day (Ambulatory and capable of all self care but unable to carry out any work activities. Up and about more than 50% of waking hours)  3 - Symptomatic, >50% in bed, but not bedbound (Capable of only limited self-care, confined to bed or chair 50% or more of waking hours)  4 - Bedbound  (Completely disabled.  Cannot carry on any self-care. Totally confined to bed or chair)  5 - Death   Santiago Glad MM, Creech RH, Tormey DC, et al. [phone]). "Toxicity and response criteria of the [facility]". Am. Evlyn Clines. Oncol. 5 (6): 649-55    LABORATORY DATA:  Lab Results  Component Value Date   WBC 4.0 08/03/2023   HGB 14.3 08/03/2023   HCT 41.8 08/03/2023   MCV 88.4 08/03/2023   PLT 107 (L) 08/03/2023   Lab Results  Component Value Date   NA 141 08/03/2023   K 4.3 08/03/2023   CL 104 08/03/2023   CO2 31 08/03/2023   Lab Results  Component Value Date   ALT 19 08/03/2023   AST 24 08/03/2023   ALKPHOS 82 08/03/2023   BILITOT 0.7 08/03/2023      RADIOGRAPHY: NM PET Image Restag (PS) Skull Base To Thigh Result Date: 08/28/2023 CLINICAL DATA:  Subsequent treatment strategy for non-small cell lung cancer. EXAM: NUCLEAR MEDICINE PET SKULL BASE TO THIGH TECHNIQUE: 7.8 mCi F-18 FDG was injected intravenously. Full-ring PET imaging was performed from the skull base to thigh after the radiotracer. CT data was obtained and used for attenuation correction and anatomic localization. Fasting blood glucose: 105 mg/dl COMPARISON:  CT 11/91/4782, PET-CT 02/10/2023 FINDINGS: NECK: No hypermetabolic lymph nodes in the neck. Incidental CT findings: None. CHEST: Enlarging RIGHT upper lobe nodule measures 2.2 cm with SUV max equal 8.5. There are new RIGHT hilar and mediastinal adenopathy. For example RIGHT hilar node with SUV max equal 10.0 on image 61. Hypermetabolic subcarinal and RIGHT lower paratracheal lymph node. Example subcarinal node with SUV max 9.1. No supraclavicular adenopathy. Previously seen hypermetabolic nodule in the lateral aspect of the LEFT upper lobe has near completely resolved with trace nodularity in faint metabolic activity (image 56). Incidental CT findings: None. ABDOMEN/PELVIS: No abnormal hypermetabolic activity within the liver, pancreas, adrenal glands, or  spleen. No hypermetabolic lymph nodes in the abdomen or pelvis. Incidental CT findings: None. SKELETON: No focal hypermetabolic activity to suggest skeletal metastasis. Incidental CT findings: Bilateral hip prosthetic IMPRESSION: 1. Enlarging hypermetabolic RIGHT upper lobe nodule consistent with bronchogenic carcinoma. 2. New hypermetabolic RIGHT hilar and mediastinal adenopathy consistent with nodal metastasis. 3. Near complete resolution of hypermetabolic nodule in the LEFT upper lobe. 4. No evidence of metastatic disease in the abdomen pelvis. Electronically Signed   By: Genevive Bi M.D.   On: 08/28/2023 11:51       IMPRESSION/PLAN: 1.  The patient is a 78 year old female with squamous cell carcinoma of the right upper lobe, with hilar and mediastinal lymphadenopathy.  I again reviewed with her the role of external beam thoracic radiation in the management of non-small cell lung carcinoma.  I explained the logistics of external beam radiation, as well as its potential side effects.  I explained that these may include, but are not limited to, fatigue and discomfort with swallowing/esophagitis.  I explained that potential long-term side effects may include, but are not limited to, damage to lung and other normal tissues.  She expressed understanding, and is agreeable to proceed.  We will make arrangements for her to return to our department for simulation and treatment planning.   In a visit lasting 40 minutes, greater than 50% of the time was spent face to face discussing the patient's condition, in preparation for the discussion, and coordinating the patient's care.    Jayshon Dommer A. Thersa Salt, MD   **Disclaimer: This note was dictated with voice recognition software. Similar  sounding words can inadvertently be transcribed and this note may contain transcription errors which may not have been corrected upon publication of note.**

## 2023-09-10 ENCOUNTER — Other Ambulatory Visit: Payer: Self-pay | Admitting: Hematology

## 2023-09-10 DIAGNOSIS — C3491 Malignant neoplasm of unspecified part of right bronchus or lung: Secondary | ICD-10-CM | POA: Insufficient documentation

## 2023-09-10 DIAGNOSIS — C3411 Malignant neoplasm of upper lobe, right bronchus or lung: Secondary | ICD-10-CM | POA: Insufficient documentation

## 2023-09-10 DIAGNOSIS — Z7189 Other specified counseling: Secondary | ICD-10-CM

## 2023-09-10 MED ORDER — LIDOCAINE-PRILOCAINE 2.5-2.5 % EX CREA: TOPICAL_CREAM | CUTANEOUS | 3 refills | Status: DC

## 2023-09-10 MED ORDER — DEXAMETHASONE 4 MG PO TABS: ORAL_TABLET | ORAL | 1 refills | Status: DC

## 2023-09-10 MED ORDER — ONDANSETRON HCL 8 MG PO TABS
8.0000 mg | ORAL_TABLET | Freq: Three times a day (TID) | ORAL | 1 refills | Status: DC | PRN
Start: 2023-09-10 — End: 2023-12-17

## 2023-09-10 MED ORDER — PROCHLORPERAZINE MALEATE 10 MG PO TABS
10.0000 mg | ORAL_TABLET | Freq: Four times a day (QID) | ORAL | 1 refills | Status: DC | PRN
Start: 2023-09-10 — End: 2023-12-17

## 2023-09-10 NOTE — Addendum Note (Signed)
 Encounter addended by: Dyane Dustman, RN on: 09/10/2023 4:02 PM  Actions taken: Charge Capture section accepted

## 2023-09-10 NOTE — Progress Notes (Signed)
 START ON PATHWAY REGIMEN - Non-Small Cell Lung     A cycle is every 7 days, concurrent with RT:     Paclitaxel      Carboplatin   **Always confirm dose/schedule in your pharmacy ordering system**  Patient Characteristics: Preoperative or Nonsurgical Candidate (Clinical Staging), Stage IIB (N2a only) or Stage III - Nonsurgical Candidate, PS = 0,1 Therapeutic Status: Preoperative or Nonsurgical Candidate (Clinical Staging) AJCC T Category: cTX AJCC N Category: cNX AJCC M Category: cM0 AJCC 9 Stage Grouping: IIIA Check here if patient was staged using an edition other than AJCC Staging 9th Edition: false ECOG Performance Status: 1 Intent of Therapy: Curative Intent, Discussed with Patient

## 2023-09-10 NOTE — Progress Notes (Signed)
 Carboplatin/Taxol concurrent with XRT --orders placed. Patient called back and notes she wants to get chemotherapy here and XRT at [facility].

## 2023-09-11 ENCOUNTER — Encounter: Payer: Self-pay | Admitting: Hematology

## 2023-09-11 ENCOUNTER — Ambulatory Visit
Admission: RE | Admit: 2023-09-11 | Discharge: 2023-09-11 | Disposition: A | Discharge status: Home / Self Care | Payer: Medicare HMO | Source: Ambulatory Visit | Attending: Radiation Oncology | Admitting: Radiation Oncology

## 2023-09-11 DIAGNOSIS — Z5111 Encounter for antineoplastic chemotherapy: Secondary | ICD-10-CM | POA: Insufficient documentation

## 2023-09-11 DIAGNOSIS — Z510 Encounter for antineoplastic radiation therapy: Secondary | ICD-10-CM | POA: Insufficient documentation

## 2023-09-11 DIAGNOSIS — Z87891 Personal history of nicotine dependence: Secondary | ICD-10-CM | POA: Insufficient documentation

## 2023-09-11 DIAGNOSIS — C83398 Diffuse large b-cell lymphoma of other extranodal and solid organ sites: Secondary | ICD-10-CM | POA: Diagnosis not present

## 2023-09-11 DIAGNOSIS — C3411 Malignant neoplasm of upper lobe, right bronchus or lung: Secondary | ICD-10-CM | POA: Insufficient documentation

## 2023-09-11 DIAGNOSIS — C7801 Secondary malignant neoplasm of right lung: Secondary | ICD-10-CM | POA: Diagnosis not present

## 2023-09-12 ENCOUNTER — Other Ambulatory Visit: Payer: Self-pay | Admitting: Hematology

## 2023-09-12 DIAGNOSIS — C3491 Malignant neoplasm of unspecified part of right bronchus or lung: Secondary | ICD-10-CM

## 2023-09-12 DIAGNOSIS — Z7189 Other specified counseling: Secondary | ICD-10-CM

## 2023-09-13 ENCOUNTER — Other Ambulatory Visit

## 2023-09-13 DIAGNOSIS — Z87891 Personal history of nicotine dependence: Secondary | ICD-10-CM | POA: Diagnosis not present

## 2023-09-13 DIAGNOSIS — Z510 Encounter for antineoplastic radiation therapy: Secondary | ICD-10-CM | POA: Diagnosis not present

## 2023-09-13 DIAGNOSIS — C3411 Malignant neoplasm of upper lobe, right bronchus or lung: Secondary | ICD-10-CM | POA: Diagnosis not present

## 2023-09-13 DIAGNOSIS — C83398 Diffuse large b-cell lymphoma of other extranodal and solid organ sites: Secondary | ICD-10-CM | POA: Diagnosis not present

## 2023-09-13 DIAGNOSIS — Z5111 Encounter for antineoplastic chemotherapy: Secondary | ICD-10-CM | POA: Diagnosis not present

## 2023-09-13 DIAGNOSIS — C7801 Secondary malignant neoplasm of right lung: Secondary | ICD-10-CM | POA: Diagnosis not present

## 2023-09-14 ENCOUNTER — Ambulatory Visit: Payer: Medicare HMO | Admitting: Oncology

## 2023-09-14 ENCOUNTER — Other Ambulatory Visit: Payer: Medicare HMO

## 2023-09-14 DIAGNOSIS — H00013 Hordeolum externum right eye, unspecified eyelid: Secondary | ICD-10-CM | POA: Diagnosis not present

## 2023-09-14 DIAGNOSIS — C3490 Malignant neoplasm of unspecified part of unspecified bronchus or lung: Secondary | ICD-10-CM | POA: Diagnosis not present

## 2023-09-14 DIAGNOSIS — Z6824 Body mass index (BMI) 24.0-24.9, adult: Secondary | ICD-10-CM | POA: Diagnosis not present

## 2023-09-14 DIAGNOSIS — E782 Mixed hyperlipidemia: Secondary | ICD-10-CM | POA: Diagnosis not present

## 2023-09-17 ENCOUNTER — Other Ambulatory Visit

## 2023-09-17 ENCOUNTER — Ambulatory Visit: Admitting: Hematology

## 2023-09-18 ENCOUNTER — Inpatient Hospital Stay

## 2023-09-18 VITALS — BP 131/67 | HR 72 | Temp 98.8°F | Resp 19 | Wt 155.1 lb

## 2023-09-18 DIAGNOSIS — Z5111 Encounter for antineoplastic chemotherapy: Secondary | ICD-10-CM | POA: Insufficient documentation

## 2023-09-18 DIAGNOSIS — Z87891 Personal history of nicotine dependence: Secondary | ICD-10-CM | POA: Insufficient documentation

## 2023-09-18 DIAGNOSIS — Z510 Encounter for antineoplastic radiation therapy: Secondary | ICD-10-CM | POA: Diagnosis not present

## 2023-09-18 DIAGNOSIS — C3491 Malignant neoplasm of unspecified part of right bronchus or lung: Secondary | ICD-10-CM

## 2023-09-18 DIAGNOSIS — Z7189 Other specified counseling: Secondary | ICD-10-CM

## 2023-09-18 DIAGNOSIS — C3411 Malignant neoplasm of upper lobe, right bronchus or lung: Secondary | ICD-10-CM | POA: Insufficient documentation

## 2023-09-18 LAB — CMP (CANCER CENTER ONLY)
ALT: 13 U/L (ref 0–44)
AST: 20 U/L (ref 15–41)
Albumin: 4.3 g/dL (ref 3.5–5.0)
Alkaline Phosphatase: 74 U/L (ref 38–126)
Anion gap: 5 (ref 5–15)
BUN: 13 mg/dL (ref 8–23)
CO2: 28 mmol/L (ref 22–32)
Calcium: 9.1 mg/dL (ref 8.9–10.3)
Chloride: 108 mmol/L (ref 98–111)
Creatinine: 0.69 mg/dL (ref 0.44–1.00)
GFR, Estimated: >60 mL/min (ref >60)
Glucose, Bld: 98 mg/dL (ref 70–99)
Potassium: 4 mmol/L (ref 3.5–5.1)
Sodium: 141 mmol/L (ref 135–145)
Total Bilirubin: 0.5 mg/dL (ref 0.0–1.2)
Total Protein: 6.5 g/dL (ref 6.5–8.1)

## 2023-09-18 LAB — CBC WITH DIFFERENTIAL (CANCER CENTER ONLY)
Abs Immature Granulocytes: 0.01 10*3/uL (ref 0.00–0.07)
Basophils Absolute: 0 10*3/uL (ref 0.0–0.1)
Basophils Relative: 0 %
Eosinophils Absolute: 0.1 10*3/uL (ref 0.0–0.5)
Eosinophils Relative: 2 %
HCT: 40.7 % (ref 36.0–46.0)
Hemoglobin: 13.8 g/dL (ref 12.0–15.0)
Immature Granulocytes: 0 %
Lymphocytes Relative: 13 %
Lymphs Abs: 0.4 10*3/uL — ABNORMAL LOW (ref 0.7–4.0)
MCH: 30.7 pg (ref 26.0–34.0)
MCHC: 33.9 g/dL (ref 30.0–36.0)
MCV: 90.4 fL (ref 80.0–100.0)
Monocytes Absolute: 0.4 10*3/uL (ref 0.1–1.0)
Monocytes Relative: 12 %
Neutro Abs: 2.2 10*3/uL (ref 1.7–7.7)
Neutrophils Relative %: 73 %
Platelet Count: 93 10*3/uL — ABNORMAL LOW (ref 150–400)
RBC: 4.5 MIL/uL (ref 3.87–5.11)
RDW: 14.1 % (ref 11.5–15.5)
WBC Count: 3 10*3/uL — ABNORMAL LOW (ref 4.0–10.5)
nRBC: 0 % (ref 0.0–0.2)

## 2023-09-18 MED ORDER — PALONOSETRON HCL INJECTION 0.25 MG/5ML
0.2500 mg | Freq: Once | INTRAVENOUS | Status: AC
Start: 2023-09-18 — End: 2023-09-18
  Administered 2023-09-18: 0.25 mg via INTRAVENOUS
  Filled 2023-09-18: qty 5

## 2023-09-18 MED ORDER — FAMOTIDINE IN NACL 20-0.9 MG/50ML-% IV SOLN
20.0000 mg | Freq: Once | INTRAVENOUS | Status: AC
  Administered 2023-09-18: 20 mg via INTRAVENOUS
  Filled 2023-09-18: qty 50

## 2023-09-18 MED ORDER — SODIUM CHLORIDE 0.9% FLUSH
10.0000 mL | INTRAVENOUS | Status: DC | PRN
Start: 2023-09-18 — End: 2023-09-18
  Administered 2023-09-18: 10 mL

## 2023-09-18 MED ORDER — DEXAMETHASONE SODIUM PHOSPHATE 10 MG/ML IJ SOLN
10.0000 mg | Freq: Once | INTRAMUSCULAR | Status: AC
Start: 2023-09-18 — End: 2023-09-18
  Administered 2023-09-18: 10 mg via INTRAVENOUS
  Filled 2023-09-18: qty 1

## 2023-09-18 MED ORDER — SODIUM CHLORIDE 0.9 % IV SOLN
154.4000 mg | Freq: Once | INTRAVENOUS | Status: AC
  Administered 2023-09-18: 150 mg via INTRAVENOUS
  Filled 2023-09-18: qty 15

## 2023-09-18 MED ORDER — DIPHENHYDRAMINE HCL 50 MG/ML IJ SOLN
50.0000 mg | Freq: Once | INTRAMUSCULAR | Status: AC
  Administered 2023-09-18: 50 mg via INTRAVENOUS
  Filled 2023-09-18: qty 1

## 2023-09-18 MED ORDER — HEPARIN SOD (PORK) LOCK FLUSH 100 UNIT/ML IV SOLN
500.0000 [IU] | Freq: Once | INTRAVENOUS | Status: AC | PRN
  Administered 2023-09-18: 500 [IU]

## 2023-09-18 MED ORDER — SODIUM CHLORIDE 0.9 % IV SOLN
45.0000 mg/m2 | Freq: Once | INTRAVENOUS | Status: AC
  Administered 2023-09-18: 84 mg via INTRAVENOUS
  Filled 2023-09-18: qty 14

## 2023-09-18 MED ORDER — SODIUM CHLORIDE 0.9 % IV SOLN
INTRAVENOUS | Status: DC
Start: 2023-09-18 — End: 2023-09-18

## 2023-09-18 NOTE — Patient Instructions (Signed)
 [facility]  Discharge Instructions: Thank you for choosing [facility] to provide your oncology and hematology care.   If you have a lab appointment with the Cancer Center, please go directly to the Cancer Center and check in at the registration area.   Wear comfortable clothing and clothing appropriate for easy access to any Portacath or PICC line.   We strive to give you quality time with your provider. You may need to reschedule your appointment if you arrive late (15 or more minutes).  Arriving late affects you and other patients whose appointments are after yours.  Also, if you miss three or more appointments without notifying the office, you may be dismissed from the clinic at the provider's discretion.      For prescription refill requests, have your pharmacy contact our office and allow 72 hours for refills to be completed.    Today you received the following chemotherapy and/or immunotherapy agents: CARBOplatin (PARAPLATIN) and PACLitaxel (TAXOL)     To help prevent nausea and vomiting after your treatment, we encourage you to take your nausea medication as directed.  BELOW ARE SYMPTOMS THAT SHOULD BE REPORTED IMMEDIATELY: *FEVER GREATER THAN 100.4 F (38 C) OR HIGHER *CHILLS OR SWEATING *NAUSEA AND VOMITING THAT IS NOT CONTROLLED WITH YOUR NAUSEA MEDICATION *UNUSUAL SHORTNESS OF BREATH *UNUSUAL BRUISING OR BLEEDING *URINARY PROBLEMS (pain or burning when urinating, or frequent urination) *BOWEL PROBLEMS (unusual diarrhea, constipation, pain near the anus) TENDERNESS IN MOUTH AND THROAT WITH OR WITHOUT PRESENCE OF ULCERS (sore throat, sores in mouth, or a toothache) UNUSUAL RASH, SWELLING OR PAIN  UNUSUAL VAGINAL DISCHARGE OR ITCHING   Items with * indicate a potential emergency and should be followed up as soon as possible or go to the Emergency Department if any problems should occur.  Please show the  CHEMOTHERAPY ALERT CARD or IMMUNOTHERAPY ALERT CARD at check-in to the Emergency Department and triage nurse.  Should you have questions after your visit or need to cancel or reschedule your appointment, please contact [facility]  Dept: [phone]  and follow the prompts.  Office hours are 8:00 a.m. to 4:30 p.m. Monday - Friday. Please note that voicemails left after 4:00 p.m. may not be returned until the following business day.  We are closed weekends and major holidays. You have access to a nurse at all times for urgent questions. Please call the main number to the clinic Dept: [phone] and follow the prompts.   For any non-urgent questions, you may also contact your provider using MyChart. We now offer e-Visits for anyone 16 and older to request care online for non-urgent symptoms. For details visit [email or web address].   Also download the MyChart app! Go to the app store, search "MyChart", open the app, select [facility], and log in with your MyChart username and password.  Carboplatin Injection What is this medication? CARBOPLATIN (KAR boe pla tin) treats some types of cancer. It works by slowing down the growth of cancer cells. This medicine may be used for other purposes; ask your health care provider or pharmacist if you have questions. COMMON BRAND NAME(S): Paraplatin What should I tell my care team before I take this medication? They need to know if you have any of these conditions: Blood disorders Hearing problems Kidney disease Recent or ongoing radiation therapy An unusual or allergic reaction to  carboplatin, cisplatin, other medications, foods, dyes, or preservatives Pregnant or trying to get pregnant Breast-feeding How should I use this medication? This medication is injected into a vein. It is given by your care team in a hospital or clinic setting. Talk to your care team about the use of this medication in  children. Special care may be needed. Overdosage: If you think you have taken too much of this medicine contact a poison control center or emergency room at once. NOTE: This medicine is only for you. Do not share this medicine with others. What if I miss a dose? Keep appointments for follow-up doses. It is important not to miss your dose. Call your care team if you are unable to keep an appointment. What may interact with this medication? Medications for seizures Some antibiotics, such as amikacin, gentamicin, neomycin, streptomycin, tobramycin Vaccines This list may not describe all possible interactions. Give your health care provider a list of all the medicines, herbs, non-prescription drugs, or dietary supplements you use. Also tell them if you smoke, drink alcohol, or use illegal drugs. Some items may interact with your medicine. What should I watch for while using this medication? Your condition will be monitored carefully while you are receiving this medication. You may need blood work while taking this medication. This medication may make you feel generally unwell. This is not uncommon, as chemotherapy can affect healthy cells as well as cancer cells. Report any side effects. Continue your course of treatment even though you feel ill unless your care team tells you to stop. In some cases, you may be given additional medications to help with side effects. Follow all directions for their use. This medication may increase your risk of getting an infection. Call your care team for advice if you get a fever, chills, sore throat, or other symptoms of a cold or flu. Do not treat yourself. Try to avoid being around people who are sick. Avoid taking medications that contain aspirin, acetaminophen, ibuprofen, naproxen, or ketoprofen unless instructed by your care team. These medications may hide a fever. Be careful brushing or flossing your teeth or using a toothpick because you may get an infection or  bleed more easily. If you have any dental work done, tell your dentist you are receiving this medication. Talk to your care team if you wish to become pregnant or think you might be pregnant. This medication can cause serious birth defects. Talk to your care team about effective forms of contraception. Do not breast-feed while taking this medication. What side effects may I notice from receiving this medication? Side effects that you should report to your care team as soon as possible: Allergic reactions--skin rash, itching, hives, swelling of the face, lips, tongue, or throat Infection--fever, chills, cough, sore throat, wounds that don't heal, pain or trouble when passing urine, general feeling of discomfort or being unwell Low red blood cell level--unusual weakness or fatigue, dizziness, headache, trouble breathing Pain, tingling, or numbness in the hands or feet, muscle weakness, change in vision, confusion or trouble speaking, loss of balance or coordination, trouble walking, seizures Unusual bruising or bleeding Side effects that usually do not require medical attention (report to your care team if they continue or are bothersome): Hair loss Nausea Unusual weakness or fatigue Vomiting This list may not describe all possible side effects. Call your doctor for medical advice about side effects. You may report side effects to FDA at 1-800-FDA-1088. Where should I keep my medication? This medication is given in  a hospital or clinic. It will not be stored at home. NOTE: This sheet is a summary. It may not cover all possible information. If you have questions about this medicine, talk to your doctor, pharmacist, or health care provider.  2024 Elsevier/Gold Standard (2021-10-18 00:00:00)  Paclitaxel Injection What is this medication? PACLITAXEL (PAK li TAX el) treats some types of cancer. It works by slowing down the growth of cancer cells. This medicine may be used for other purposes; ask  your health care provider or pharmacist if you have questions. COMMON BRAND NAME(S): Onxol, Taxol What should I tell my care team before I take this medication? They need to know if you have any of these conditions: Heart disease Liver disease Low white blood cell levels An unusual or allergic reaction to paclitaxel, other medications, foods, dyes, or preservatives If you or your partner are pregnant or trying to get pregnant Breast-feeding How should I use this medication? This medication is injected into a vein. It is given by your care team in a hospital or clinic setting. Talk to your care team about the use of this medication in children. While it may be given to children for selected conditions, precautions do apply. Overdosage: If you think you have taken too much of this medicine contact a poison control center or emergency room at once. NOTE: This medicine is only for you. Do not share this medicine with others. What if I miss a dose? Keep appointments for follow-up doses. It is important not to miss your dose. Call your care team if you are unable to keep an appointment. What may interact with this medication? Do not take this medication with any of the following: Live virus vaccines Other medications may affect the way this medication works. Talk with your care team about all of the medications you take. They may suggest changes to your treatment plan to lower the risk of side effects and to make sure your medications work as intended. This list may not describe all possible interactions. Give your health care provider a list of all the medicines, herbs, non-prescription drugs, or dietary supplements you use. Also tell them if you smoke, drink alcohol, or use illegal drugs. Some items may interact with your medicine. What should I watch for while using this medication? Your condition will be monitored carefully while you are receiving this medication. You may need blood work while  taking this medication. This medication may make you feel generally unwell. This is not uncommon as chemotherapy can affect healthy cells as well as cancer cells. Report any side effects. Continue your course of treatment even though you feel ill unless your care team tells you to stop. This medication can cause serious allergic reactions. To reduce the risk, your care team may give you other medications to take before receiving this one. Be sure to follow the directions from your care team. This medication may increase your risk of getting an infection. Call your care team for advice if you get a fever, chills, sore throat, or other symptoms of a cold or flu. Do not treat yourself. Try to avoid being around people who are sick. This medication may increase your risk to bruise or bleed. Call your care team if you notice any unusual bleeding. Be careful brushing or flossing your teeth or using a toothpick because you may get an infection or bleed more easily. If you have any dental work done, tell your dentist you are receiving this medication. Talk to your  care team if you may be pregnant. Serious birth defects can occur if you take this medication during pregnancy. Talk to your care team before breastfeeding. Changes to your treatment plan may be needed. What side effects may I notice from receiving this medication? Side effects that you should report to your care team as soon as possible: Allergic reactions--skin rash, itching, hives, swelling of the face, lips, tongue, or throat Heart rhythm changes--fast or irregular heartbeat, dizziness, feeling faint or lightheaded, chest pain, trouble breathing Increase in blood pressure Infection--fever, chills, cough, sore throat, wounds that don't heal, pain or trouble when passing urine, general feeling of discomfort or being unwell Low blood pressure--dizziness, feeling faint or lightheaded, blurry vision Low red blood cell level--unusual weakness or  fatigue, dizziness, headache, trouble breathing Painful swelling, warmth, or redness of the skin, blisters or sores at the infusion site Pain, tingling, or numbness in the hands or feet Slow heartbeat--dizziness, feeling faint or lightheaded, confusion, trouble breathing, unusual weakness or fatigue Unusual bruising or bleeding Side effects that usually do not require medical attention (report to your care team if they continue or are bothersome): Diarrhea Hair loss Joint pain Loss of appetite Muscle pain Nausea Vomiting This list may not describe all possible side effects. Call your doctor for medical advice about side effects. You may report side effects to FDA at 1-800-FDA-1088. Where should I keep my medication? This medication is given in a hospital or clinic. It will not be stored at home. NOTE: This sheet is a summary. It may not cover all possible information. If you have questions about this medicine, talk to your doctor, pharmacist, or health care provider.  2024 Elsevier/Gold Standard (2021-11-15 00:00:00)

## 2023-09-19 ENCOUNTER — Other Ambulatory Visit: Payer: Self-pay

## 2023-09-19 ENCOUNTER — Telehealth: Payer: Self-pay

## 2023-09-19 ENCOUNTER — Ambulatory Visit
Admission: RE | Admit: 2023-09-19 | Discharge: 2023-09-19 | Disposition: A | Discharge status: Home / Self Care | Source: Ambulatory Visit | Attending: Radiation Oncology | Admitting: Radiation Oncology

## 2023-09-19 DIAGNOSIS — C83398 Diffuse large b-cell lymphoma of other extranodal and solid organ sites: Secondary | ICD-10-CM | POA: Diagnosis not present

## 2023-09-19 DIAGNOSIS — C7801 Secondary malignant neoplasm of right lung: Secondary | ICD-10-CM | POA: Diagnosis not present

## 2023-09-19 DIAGNOSIS — C3411 Malignant neoplasm of upper lobe, right bronchus or lung: Secondary | ICD-10-CM | POA: Diagnosis not present

## 2023-09-19 DIAGNOSIS — Z5111 Encounter for antineoplastic chemotherapy: Secondary | ICD-10-CM | POA: Diagnosis not present

## 2023-09-19 DIAGNOSIS — Z87891 Personal history of nicotine dependence: Secondary | ICD-10-CM | POA: Diagnosis not present

## 2023-09-19 DIAGNOSIS — Z510 Encounter for antineoplastic radiation therapy: Secondary | ICD-10-CM | POA: Diagnosis not present

## 2023-09-19 LAB — RAD ONC ARIA SESSION SUMMARY
Course Elapsed Days: 0
Plan Fractions Treated to Date: 1
Plan Prescribed Dose Per Fraction: 2 Gy
Plan Total Fractions Prescribed: 33
Plan Total Prescribed Dose: 66 Gy
Reference Point Dosage Given to Date: 2 Gy
Reference Point Session Dosage Given: 2 Gy
Session Number: 1

## 2023-09-19 NOTE — Telephone Encounter (Signed)
-----   Message from Nurse Jiles Crocker sent at 09/18/2023  2:46 PM EDT ----- Regarding: First time Paclitaxel/Carbo. Dr. Candise Che First time Paclitaxel/carbo, patient tolerated treatment well.

## 2023-09-19 NOTE — Telephone Encounter (Signed)
 Rachel Bates  states that she is doing fine. She is eating, drinking, and urinating well. She knows to call the office at [phone] if  she has any questions or concerns.

## 2023-09-20 ENCOUNTER — Other Ambulatory Visit: Payer: Self-pay

## 2023-09-20 ENCOUNTER — Ambulatory Visit
Admission: RE | Admit: 2023-09-20 | Discharge: 2023-09-20 | Disposition: A | Discharge status: Home / Self Care | Source: Ambulatory Visit | Attending: Radiation Oncology | Admitting: Radiation Oncology

## 2023-09-20 ENCOUNTER — Telehealth: Payer: Self-pay

## 2023-09-20 ENCOUNTER — Other Ambulatory Visit: Payer: Medicare HMO

## 2023-09-20 DIAGNOSIS — C7801 Secondary malignant neoplasm of right lung: Secondary | ICD-10-CM | POA: Diagnosis not present

## 2023-09-20 DIAGNOSIS — Z510 Encounter for antineoplastic radiation therapy: Secondary | ICD-10-CM | POA: Diagnosis not present

## 2023-09-20 DIAGNOSIS — C3411 Malignant neoplasm of upper lobe, right bronchus or lung: Secondary | ICD-10-CM | POA: Diagnosis not present

## 2023-09-20 DIAGNOSIS — C83398 Diffuse large b-cell lymphoma of other extranodal and solid organ sites: Secondary | ICD-10-CM | POA: Diagnosis not present

## 2023-09-20 DIAGNOSIS — Z87891 Personal history of nicotine dependence: Secondary | ICD-10-CM | POA: Diagnosis not present

## 2023-09-20 DIAGNOSIS — Z5111 Encounter for antineoplastic chemotherapy: Secondary | ICD-10-CM | POA: Diagnosis not present

## 2023-09-20 LAB — RAD ONC ARIA SESSION SUMMARY
Course Elapsed Days: 1
Plan Fractions Treated to Date: 2
Plan Prescribed Dose Per Fraction: 2 Gy
Plan Total Fractions Prescribed: 33
Plan Total Prescribed Dose: 66 Gy
Reference Point Dosage Given to Date: 4 Gy
Reference Point Session Dosage Given: 2 Gy
Session Number: 2

## 2023-09-20 NOTE — Telephone Encounter (Signed)
-----   Message from Nurse Candy Sledge sent at 09/19/2023  4:53 PM EDT ----- Thank you I updated Dr Candise Che.  Candy Sledge RN ----- Message ----- From: Dyane Dustman, RN Sent: 09/19/2023  10:20 AM EDT To: Vanessa Kick, RN  Patient was brought to me for a VS check this morning after radiation because her face is flushed.  Her VS were good at 98.5T, P 78, R 18, BP 134/63, O2 97%,  She denies pain, N&V, C&D and said she isn't feeling any side effects of her chemo at that time.

## 2023-09-21 ENCOUNTER — Other Ambulatory Visit: Payer: Self-pay

## 2023-09-21 ENCOUNTER — Ambulatory Visit
Admission: RE | Admit: 2023-09-21 | Discharge: 2023-09-21 | Disposition: A | Discharge status: Home / Self Care | Source: Ambulatory Visit | Attending: Radiation Oncology | Admitting: Radiation Oncology

## 2023-09-21 DIAGNOSIS — Z87891 Personal history of nicotine dependence: Secondary | ICD-10-CM | POA: Diagnosis not present

## 2023-09-21 DIAGNOSIS — C3411 Malignant neoplasm of upper lobe, right bronchus or lung: Secondary | ICD-10-CM | POA: Diagnosis not present

## 2023-09-21 DIAGNOSIS — C83398 Diffuse large b-cell lymphoma of other extranodal and solid organ sites: Secondary | ICD-10-CM | POA: Diagnosis not present

## 2023-09-21 DIAGNOSIS — C7801 Secondary malignant neoplasm of right lung: Secondary | ICD-10-CM | POA: Diagnosis not present

## 2023-09-21 DIAGNOSIS — Z510 Encounter for antineoplastic radiation therapy: Secondary | ICD-10-CM | POA: Diagnosis not present

## 2023-09-21 DIAGNOSIS — Z5111 Encounter for antineoplastic chemotherapy: Secondary | ICD-10-CM | POA: Diagnosis not present

## 2023-09-21 LAB — RAD ONC ARIA SESSION SUMMARY
Course Elapsed Days: 2
Plan Fractions Treated to Date: 3
Plan Prescribed Dose Per Fraction: 2 Gy
Plan Total Fractions Prescribed: 33
Plan Total Prescribed Dose: 66 Gy
Reference Point Dosage Given to Date: 6 Gy
Reference Point Session Dosage Given: 2 Gy
Session Number: 3

## 2023-09-24 ENCOUNTER — Ambulatory Visit
Admission: RE | Admit: 2023-09-24 | Discharge: 2023-09-24 | Disposition: A | Discharge status: Home / Self Care | Source: Ambulatory Visit | Attending: Radiation Oncology

## 2023-09-24 ENCOUNTER — Other Ambulatory Visit: Payer: Self-pay

## 2023-09-24 DIAGNOSIS — Z510 Encounter for antineoplastic radiation therapy: Secondary | ICD-10-CM | POA: Diagnosis not present

## 2023-09-24 DIAGNOSIS — Z5111 Encounter for antineoplastic chemotherapy: Secondary | ICD-10-CM | POA: Diagnosis not present

## 2023-09-24 DIAGNOSIS — C7801 Secondary malignant neoplasm of right lung: Secondary | ICD-10-CM | POA: Diagnosis not present

## 2023-09-24 DIAGNOSIS — Z87891 Personal history of nicotine dependence: Secondary | ICD-10-CM | POA: Diagnosis not present

## 2023-09-24 DIAGNOSIS — C83398 Diffuse large b-cell lymphoma of other extranodal and solid organ sites: Secondary | ICD-10-CM | POA: Diagnosis not present

## 2023-09-24 DIAGNOSIS — C3411 Malignant neoplasm of upper lobe, right bronchus or lung: Secondary | ICD-10-CM | POA: Diagnosis not present

## 2023-09-24 LAB — RAD ONC ARIA SESSION SUMMARY
Course Elapsed Days: 5
Plan Fractions Treated to Date: 4
Plan Prescribed Dose Per Fraction: 2 Gy
Plan Total Fractions Prescribed: 33
Plan Total Prescribed Dose: 66 Gy
Reference Point Dosage Given to Date: 8 Gy
Reference Point Session Dosage Given: 2 Gy
Session Number: 4

## 2023-09-25 ENCOUNTER — Ambulatory Visit
Admission: RE | Admit: 2023-09-25 | Discharge: 2023-09-25 | Discharge status: Home / Self Care | Source: Ambulatory Visit | Attending: Radiation Oncology | Admitting: Radiation Oncology

## 2023-09-25 ENCOUNTER — Encounter: Payer: Medicare HMO | Admitting: Thoracic Surgery (Cardiothoracic Vascular Surgery)

## 2023-09-25 ENCOUNTER — Inpatient Hospital Stay (HOSPITAL_BASED_OUTPATIENT_CLINIC_OR_DEPARTMENT_OTHER): Admitting: Hematology

## 2023-09-25 ENCOUNTER — Inpatient Hospital Stay

## 2023-09-25 ENCOUNTER — Other Ambulatory Visit: Payer: Self-pay

## 2023-09-25 ENCOUNTER — Ambulatory Visit
Admission: RE | Admit: 2023-09-25 | Discharge: 2023-09-25 | Disposition: A | Discharge status: Home / Self Care | Source: Ambulatory Visit | Attending: Radiation Oncology | Admitting: Radiation Oncology

## 2023-09-25 VITALS — BP 122/60 | HR 80 | Temp 98.1°F | Resp 18 | Wt 152.5 lb

## 2023-09-25 DIAGNOSIS — C83398 Diffuse large b-cell lymphoma of other extranodal and solid organ sites: Secondary | ICD-10-CM | POA: Diagnosis not present

## 2023-09-25 DIAGNOSIS — Z7189 Other specified counseling: Secondary | ICD-10-CM

## 2023-09-25 DIAGNOSIS — Z95828 Presence of other vascular implants and grafts: Secondary | ICD-10-CM

## 2023-09-25 DIAGNOSIS — C3411 Malignant neoplasm of upper lobe, right bronchus or lung: Secondary | ICD-10-CM | POA: Diagnosis not present

## 2023-09-25 DIAGNOSIS — C7801 Secondary malignant neoplasm of right lung: Secondary | ICD-10-CM | POA: Diagnosis not present

## 2023-09-25 DIAGNOSIS — C3491 Malignant neoplasm of unspecified part of right bronchus or lung: Secondary | ICD-10-CM | POA: Diagnosis not present

## 2023-09-25 DIAGNOSIS — Z5111 Encounter for antineoplastic chemotherapy: Secondary | ICD-10-CM | POA: Diagnosis not present

## 2023-09-25 DIAGNOSIS — Z510 Encounter for antineoplastic radiation therapy: Secondary | ICD-10-CM | POA: Diagnosis not present

## 2023-09-25 DIAGNOSIS — Z87891 Personal history of nicotine dependence: Secondary | ICD-10-CM | POA: Diagnosis not present

## 2023-09-25 LAB — CMP (CANCER CENTER ONLY)
ALT: 16 U/L (ref 0–44)
AST: 20 U/L (ref 15–41)
Albumin: 4.3 g/dL (ref 3.5–5.0)
Alkaline Phosphatase: 70 U/L (ref 38–126)
Anion gap: 5 (ref 5–15)
BUN: 16 mg/dL (ref 8–23)
CO2: 28 mmol/L (ref 22–32)
Calcium: 10.1 mg/dL (ref 8.9–10.3)
Chloride: 106 mmol/L (ref 98–111)
Creatinine: 0.73 mg/dL (ref 0.44–1.00)
GFR, Estimated: >60 mL/min (ref >60)
Glucose, Bld: 116 mg/dL — ABNORMAL HIGH (ref 70–99)
Potassium: 4.5 mmol/L (ref 3.5–5.1)
Sodium: 139 mmol/L (ref 135–145)
Total Bilirubin: 0.6 mg/dL (ref 0.0–1.2)
Total Protein: 6.7 g/dL (ref 6.5–8.1)

## 2023-09-25 LAB — CBC WITH DIFFERENTIAL (CANCER CENTER ONLY)
Abs Immature Granulocytes: 0.02 10*3/uL (ref 0.00–0.07)
Basophils Absolute: 0 10*3/uL (ref 0.0–0.1)
Basophils Relative: 0 %
Eosinophils Absolute: 0.1 10*3/uL (ref 0.0–0.5)
Eosinophils Relative: 1 %
HCT: 40.6 % (ref 36.0–46.0)
Hemoglobin: 13.9 g/dL (ref 12.0–15.0)
Immature Granulocytes: 0 %
Lymphocytes Relative: 10 %
Lymphs Abs: 0.5 10*3/uL — ABNORMAL LOW (ref 0.7–4.0)
MCH: 30.5 pg (ref 26.0–34.0)
MCHC: 34.2 g/dL (ref 30.0–36.0)
MCV: 89 fL (ref 80.0–100.0)
Monocytes Absolute: 0.3 10*3/uL (ref 0.1–1.0)
Monocytes Relative: 6 %
Neutro Abs: 3.8 10*3/uL (ref 1.7–7.7)
Neutrophils Relative %: 83 %
Platelet Count: 128 10*3/uL — ABNORMAL LOW (ref 150–400)
RBC: 4.56 MIL/uL (ref 3.87–5.11)
RDW: 13.6 % (ref 11.5–15.5)
WBC Count: 4.6 10*3/uL (ref 4.0–10.5)
nRBC: 0 % (ref 0.0–0.2)

## 2023-09-25 LAB — RAD ONC ARIA SESSION SUMMARY
Course Elapsed Days: 6
Plan Fractions Treated to Date: 5
Plan Prescribed Dose Per Fraction: 2 Gy
Plan Total Fractions Prescribed: 33
Plan Total Prescribed Dose: 66 Gy
Reference Point Dosage Given to Date: 10 Gy
Reference Point Session Dosage Given: 2 Gy
Session Number: 5

## 2023-09-25 MED ORDER — SODIUM CHLORIDE 0.9% FLUSH
10.0000 mL | INTRAVENOUS | Status: DC | PRN
Start: 2023-09-25 — End: 2023-09-25
  Administered 2023-09-25: 10 mL

## 2023-09-25 MED ORDER — SODIUM CHLORIDE 0.9 % IV SOLN: INTRAVENOUS | Status: DC

## 2023-09-25 MED ORDER — FAMOTIDINE IN NACL 20-0.9 MG/50ML-% IV SOLN
20.0000 mg | Freq: Once | INTRAVENOUS | Status: AC
  Administered 2023-09-25: 20 mg via INTRAVENOUS
  Filled 2023-09-25: qty 50

## 2023-09-25 MED ORDER — DIPHENHYDRAMINE HCL 50 MG/ML IJ SOLN
50.0000 mg | Freq: Once | INTRAMUSCULAR | Status: AC
  Administered 2023-09-25: 50 mg via INTRAVENOUS
  Filled 2023-09-25: qty 1

## 2023-09-25 MED ORDER — HEPARIN SOD (PORK) LOCK FLUSH 100 UNIT/ML IV SOLN
500.0000 [IU] | Freq: Once | INTRAVENOUS | Status: AC | PRN
  Administered 2023-09-25: 500 [IU]

## 2023-09-25 MED ORDER — SODIUM CHLORIDE 0.9% FLUSH
10.0000 mL | Freq: Once | INTRAVENOUS | Status: AC
  Administered 2023-09-25: 10 mL

## 2023-09-25 MED ORDER — DEXAMETHASONE SODIUM PHOSPHATE 10 MG/ML IJ SOLN
10.0000 mg | Freq: Once | INTRAMUSCULAR | Status: AC
  Administered 2023-09-25: 10 mg via INTRAVENOUS
  Filled 2023-09-25: qty 1

## 2023-09-25 MED ORDER — SODIUM CHLORIDE 0.9 % IV SOLN
154.4000 mg | Freq: Once | INTRAVENOUS | Status: AC
  Administered 2023-09-25: 150 mg via INTRAVENOUS
  Filled 2023-09-25: qty 15

## 2023-09-25 MED ORDER — SODIUM CHLORIDE 0.9 % IV SOLN
45.0000 mg/m2 | Freq: Once | INTRAVENOUS | Status: AC
  Administered 2023-09-25: 84 mg via INTRAVENOUS
  Filled 2023-09-25: qty 14

## 2023-09-25 MED ORDER — PALONOSETRON HCL INJECTION 0.25 MG/5ML
0.2500 mg | Freq: Once | INTRAVENOUS | Status: AC
Start: 2023-09-25 — End: 2023-09-25
  Administered 2023-09-25: 0.25 mg via INTRAVENOUS
  Filled 2023-09-25: qty 5

## 2023-09-25 NOTE — Patient Instructions (Signed)
 [facility]  Discharge Instructions: Thank you for choosing [facility] to provide your oncology and hematology care.   If you have a lab appointment with the Cancer Center, please go directly to the Cancer Center and check in at the registration area.   Wear comfortable clothing and clothing appropriate for easy access to any Portacath or PICC line.   We strive to give you quality time with your provider. You may need to reschedule your appointment if you arrive late (15 or more minutes).  Arriving late affects you and other patients whose appointments are after yours.  Also, if you miss three or more appointments without notifying the office, you may be dismissed from the clinic at the provider's discretion.      For prescription refill requests, have your pharmacy contact our office and allow 72 hours for refills to be completed.    Today you received the following chemotherapy and/or immunotherapy agents Taxol, Carboplatin      To help prevent nausea and vomiting after your treatment, we encourage you to take your nausea medication as directed.  BELOW ARE SYMPTOMS THAT SHOULD BE REPORTED IMMEDIATELY: *FEVER GREATER THAN 100.4 F (38 C) OR HIGHER *CHILLS OR SWEATING *NAUSEA AND VOMITING THAT IS NOT CONTROLLED WITH YOUR NAUSEA MEDICATION *UNUSUAL SHORTNESS OF BREATH *UNUSUAL BRUISING OR BLEEDING *URINARY PROBLEMS (pain or burning when urinating, or frequent urination) *BOWEL PROBLEMS (unusual diarrhea, constipation, pain near the anus) TENDERNESS IN MOUTH AND THROAT WITH OR WITHOUT PRESENCE OF ULCERS (sore throat, sores in mouth, or a toothache) UNUSUAL RASH, SWELLING OR PAIN  UNUSUAL VAGINAL DISCHARGE OR ITCHING   Items with * indicate a potential emergency and should be followed up as soon as possible or go to the Emergency Department if any problems should occur.  Please show the CHEMOTHERAPY ALERT CARD or  IMMUNOTHERAPY ALERT CARD at check-in to the Emergency Department and triage nurse.  Should you have questions after your visit or need to cancel or reschedule your appointment, please contact [facility]  Dept: [phone]  and follow the prompts.  Office hours are 8:00 a.m. to 4:30 p.m. Monday - Friday. Please note that voicemails left after 4:00 p.m. may not be returned until the following business day.  We are closed weekends and major holidays. You have access to a nurse at all times for urgent questions. Please call the main number to the clinic Dept: [phone] and follow the prompts.   For any non-urgent questions, you may also contact your provider using MyChart. We now offer e-Visits for anyone 64 and older to request care online for non-urgent symptoms. For details visit [email or web address].   Also download the MyChart app! Go to the app store, search "MyChart", open the app, select [facility], and log in with your MyChart username and password.

## 2023-09-25 NOTE — Progress Notes (Signed)
 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 female who has been referred to [place] by Carylon Perches, DO for evaluation and management of non hodgkin's lymphoma. She has a hx of chronic systolic heart failure.  Nasolaryngoscopy revealed a mass lesion of the left oropharynx, emanating from the left base of tongue/glossotonsillar sulcus. Patient subsequently had a CT neck with contrast performed on 08/09/2022, which reported extensive bilateral adenopaty, with greatest lymph node conglomerate measuring 3.2 x 2.4 cm in the right level 2. Patient underwent core needle biopsy on 08/18/22 and endorsed persistent throat pain and odynophagia at the time. She is a former smoker and quit in the late 90s. SHe endorsed heavy tobacco use prior to quitting, with 1.5 pack/day for about 30 years.   Today, she is accompanied by her daughter. She complains of a sore throat beginning around Thanksgiving time. She was given antibiotics and prednisone which did not improve symptoms. She endorses swelling in her neck with associated soreness which came quickly past 2-3 weeks. She reports that her neck edema has recently grown on both the left and right sides. Her neck pain is in a specific area. She also complains of recent her neck pain if she turns her neck.  She did present Emergency EMT because something was stuck in her throat. Endoscopy revealed that it was food and it was pushed down. An ulcer was also found in her esophagus and some narrowing which will be stretched in three  months. She has not had any issues with swallowing food since then. She has not had a biopsy of issue on the back of her tongue. No other lumps/bumps, unexplained fevers, chills, night sweat, change in breathing, abdominal pain, or skin rashes. She reports discomfort with drinking tea which caused her to eat less as well.   She is allergic to Sumatriptan and another medication she cannot recall. She does not consume alcohol and has quit smoking in the 90s. 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 She believes the sore was caused by shoe discomfort.  She denies any facial puffiness, abdominal pain, recent change in bowel habits or urination. She reports that her defibrillator has never gone off.   She reports a ganglion cyst on her right knee, 2 knee replacements, and 2 hip replacements. She denies a Fhx of blood disorders. However, her sister has lung cancer and was a frequent smoker.  She is UTD with her vaccinations, incuding influenza, RSV, and COVID-19 booster. She has endorsed migraines since childhood. She lives on her own is able to complete daily activities independently. She will follow-up with Dr. Marene Lenz soon. She follows up with cardiologist regularly. Her a fib, pacemaker, and ICD device have been stable. She has not had a recent echo.   INTERVAL HISTORY:  Rachel Bates is a 78 y.o. female who presents today for follow up and management of diffuse large B-cell lymphoma. She is here for cycle 2 day 1 of her CARBOplatin plus PACLitaxel treatment.   I last  connected with the patient, via tele-med visit, on 08/13/2023 and she was doing well overall.   Patient is accompanied by her friend during this visit. Patient notes she has been doing well overall since our last visit. She complains of nausea for the past couple of days, which was severe yesterday. She took Compazine, which helped her  nausea. Patient also had nausea this morning after receiving her radiation treatment. However, patient is unsure if nausea is caused by her radiation treatment or chemotherapy.   She denies any new infection issues, fever, chills, night sweats, unexpected weight loss, back pain, chest pain, abdominal pain, or leg swelling. She does complain of mild fatigue, which is not limiting her from daily activities.   Patient tolerated cycle 1 of her treatment well without any new or severe toxicities.     MEDICAL HISTORY:  Past Medical History:  Diagnosis Date   AICD (automatic cardioverter/defibrillator) present dual   [employer] ---  original placedment 2007/  generator change 2014 by dr gregg taylor   Anemia    Anticoagulant long-term use    eliquis   Anxiety    Arthralgia of multiple joints    Arthritis pain    Benign hypertensive heart disease    CAD (coronary artery disease) primary cardiologist-- dr gregg taylor   MI and 2 stents 1995 in [place]   Cancer [facility])    CHF NYHA class II, chronic, systolic (HCC)    followed by dr Sharlot Gowda taylor   Degenerative disc disease, lumbar    Depression    Dyslipidemia    History of basal cell carcinoma (BCC) excision    right ankle area s/p  excision in office 06/ 2019  in office   History of DVT of lower extremity yrs ago before 2012   History of MI (myocardial infarction) 1995  in [place]   History of pulmonary embolus (PE) 2012   History of ventricular tachycardia    Hyperlipidemia    Hypersomnia    Hypertension    Ischemic cardiomyopathy    Migraines    Myocardial infarction (HCC)    OA (osteoarthritis)    "all over"   PAF (paroxysmal atrial fibrillation) (HCC)    Pneumonia    02/2023   Primary localized osteoarthritis of right hip 12/18/2017   Primary localized osteoarthritis of right knee 05/28/2018   S/P coronary artery stent placement 1995   in [facility]   05-21-2018 per pt x2  stents in same coronary artery (unsure BM or DES)   Vitamin D  deficiency disease     SURGICAL HISTORY: Past Surgical History:  Procedure Laterality Date   BIOPSY  06/19/2022   Procedure: BIOPSY;  Surgeon: Kathi Der, MD;  Location: [facility] ENDOSCOPY;  Service: Gastroenterology;;   BRONCHIAL BIOPSY  08/07/2023   Procedure: BRONCHIAL BIOPSIES;  Surgeon: Leslye Peer, MD;  Location[facility] ENDOSCOPY;  Service: Pulmonary;;   BRONCHIAL BRUSHINGS  08/07/2023   Procedure: BRONCHIAL BRUSHINGS;  Surgeon: Leslye Peer, MD;  Location: [facility] ENDOSCOPY;  Service: Pulmonary;;   BRONCHIAL NEEDLE ASPIRATION BIOPSY  08/07/2023   Procedure: BRONCHIAL NEEDLE ASPIRATION BIOPSIES;  Surgeon: Leslye Peer, MD;  Location: [facility] ENDOSCOPY;  Service: Pulmonary;;   CARDIAC DEFIBRILLATOR PLACEMENT  2007   CATARACT EXTRACTION, BILATERAL     COLONOSCOPY  04/14/2013   colonic polyp, status post polypectomy. Mild panocolonic diverticulosis. Small internal hemorrhoids   DILATION AND CURETTAGE OF UTERUS  yrs ago   DIRECT LARYNGOSCOPY Right 10/04/2022   Procedure: DIRECT LARYNGOSCOPY;  Surgeon: Laren Boom, DO;  Location: [facility] OR;  Service: ENT;  Laterality: Right;   ESOPHAGOGASTRODUODENOSCOPY (EGD) WITH PROPOFOL N/A 06/19/2022   Procedure: ESOPHAGOGASTRODUODENOSCOPY (EGD) WITH PROPOFOL;  Surgeon: Kathi Der, MD;  Location: [facility] ENDOSCOPY;  Service: Gastroenterology;  Laterality: N/A;   FIDUCIAL MARKER PLACEMENT  08/07/2023   Procedure: FIDUCIAL MARKER PLACEMENT;  Surgeon: Leslye Peer, MD;  Location: [facility] ENDOSCOPY;  Service: Pulmonary;;   IMPACTION REMOVAL  06/19/2022   Procedure: IMPACTION REMOVAL;  Surgeon: Kathi Der, MD;  Location: [facility] ENDOSCOPY;  Service: Gastroenterology;;   IMPLANTABLE CARDIOVERTER DEFIBRILLATOR GENERATOR CHANGE N/A 02/04/2013   Procedure: IMPLANTABLE CARDIOVERTER DEFIBRILLATOR GENERATOR CHANGE;  Surgeon: Marinus Maw, MD;  Location: [facility] CATH LAB;  Service: Cardiovascular;  Laterality: N/A;   IR IMAGING GUIDED PORT INSERTION  10/27/2022    LYMPH NODE BIOPSY Right 10/04/2022   Procedure: EXCISIONAL OF RIGHT DEEP CERVICAL LYMPH NODE;  Surgeon: Laren Boom, DO;  Location: [facility] OR;  Service: ENT;  Laterality: Right;   SHOULDER ARTHROSCOPY Right 2015   TOTAL HIP ARTHROPLASTY Right 12/18/2017   Procedure: RIGHT TOTAL HIP ARTHROPLASTY;  Surgeon: Teryl Lucy, MD;  Location: [facility] OR;  Service: Orthopedics;  Laterality: Right;   TOTAL HIP ARTHROPLASTY Left 04/19/2021   Procedure: TOTAL HIP ARTHROPLASTY;  Surgeon: Teryl Lucy, MD;  Location: [facility] ORS;  Service: Orthopedics;  Laterality: Left;   TOTAL KNEE ARTHROPLASTY Right 05/28/2018   Procedure: TOTAL KNEE ARTHROPLASTY;  Surgeon: Teryl Lucy, MD;  Location: [facility] ORS;  Service: Orthopedics;  Laterality: Right;  Adductor Block   TOTAL KNEE ARTHROPLASTY Left 08/12/2021   Procedure: TOTAL KNEE ARTHROPLASTY;  Surgeon: Teryl Lucy, MD;  Location: [facility] ORS;  Service: Orthopedics;  Laterality: Left;   TUBAL LIGATION Bilateral yrs ago   VIDEO BRONCHOSCOPY WITH RADIAL ENDOBRONCHIAL ULTRASOUND  08/07/2023   Procedure: VIDEO BRONCHOSCOPY WITH RADIAL ENDOBRONCHIAL ULTRASOUND;  Surgeon: Leslye Peer, MD;  Location: [facility] ENDOSCOPY;  Service: Pulmonary;;   WISDOM TOOTH EXTRACTION      SOCIAL HISTORY: Social History   Socioeconomic History   Marital status: Single    Spouse name: Not on file   Number of children: 0   Years of education: 14   Highest education level: Not on file  Occupational History   Not on file  Tobacco Use   Smoking status: Former    Current packs/day: 0.00    Types: Cigarettes    Start date: 05/21/1966    Quit date: 05/21/1996    Years since quitting: 27.3   Smokeless tobacco: Never  Vaping Use   Vaping status: Never Used  Substance and Sexual Activity   Alcohol use: No   Drug use: Never   Sexual activity: Not on file  Other Topics Concern   Not on file  Social History Narrative   Not on file   Social Drivers of Health   Financial Resource Strain: Low  Risk  (05/28/2018)   Overall Financial Resource Strain (CARDIA)    Difficulty of Paying Living Expenses: Not hard at all  Food Insecurity: No Food Insecurity (08/28/2023)   Hunger Vital Sign    Worried About Running Out of Food in the Last Year: Never true    Ran Out of Food in the Last Year: Never true  Transportation Needs: No Transportation Needs (08/28/2023)   PRAPARE - Administrator, Civil Service (Medical): No    Lack of Transportation (Non-Medical): No  Physical Activity: Not on file  Stress: Not on file  Social Connections:  Not on file  Intimate Partner Violence: Not At Risk (08/28/2023)   Humiliation, Afraid, Rape, and Kick questionnaire    Fear of Current or Ex-Partner: No    Emotionally Abused: No    Physically Abused: No    Sexually Abused: No    FAMILY HISTORY: Family History  Problem Relation Age of Onset   Hypertension Mother    Thyroid disease Mother    Alzheimer's disease Mother    Coronary artery disease Father    Pulmonary embolism Father    Congestive Heart Failure Maternal Grandmother    Hypertension Maternal Grandmother    Heart attack Maternal Grandfather    Other Maternal Grandfather        carotid disease   Dementia Paternal Grandmother    Other Paternal Grandfather 90       accident    ALLERGIES:  is allergic to sumatriptan succinate and amitriptyline.  MEDICATIONS:  Current Outpatient Medications  Medication Sig Dispense Refill   acetaminophen (TYLENOL) 650 MG CR tablet Take 1,300 mg by mouth every 8 (eight) hours as needed for pain.     apixaban (ELIQUIS) 5 MG TABS tablet Take 1 tablet (5 mg total) by mouth 2 (two) times daily. Okay to restart this medication on 08/08/2023     b complex vitamins capsule Take 1 capsule by mouth daily.     Calcium Carb-Cholecalciferol (CALCIUM 600 + D PO) Take 1 tablet by mouth daily.     carvedilol (COREG) 12.5 MG tablet Take 12.5 mg by mouth 2 (two) times daily with a meal.      Cholecalciferol  (VITAMIN D3) 125 MCG (5000 UT) capsule Take 5,000 Units by mouth daily.     cyanocobalamin (VITAMIN B12) 1000 MCG tablet Take 1,000 mcg by mouth daily.     dexamethasone (DECADRON) 4 MG tablet Take 2 tablets daily for 2 days, start the day after chemotherapy. Take with food. 30 tablet 1   FIBER PO Take 1 capsule by mouth 2 (two) times daily.     Fiber POWD Take 1 Scoop by mouth daily.     gabapentin (NEURONTIN) 300 MG capsule Take 300 mg by mouth 3 (three) times daily.     lidocaine-prilocaine (EMLA) cream Apply to affected area once 30 g 3   loratadine (CLARITIN) 10 MG tablet Take 10 mg by mouth daily.     Omega-3 Fatty Acids (FISH OIL) 1000 MG CAPS Take 1,000 mg by mouth daily.      ondansetron (ZOFRAN) 8 MG tablet Take 1 tablet (8 mg total) by mouth every 8 (eight) hours as needed for nausea or vomiting. Start on the third day after chemotherapy. 30 tablet 1   prochlorperazine (COMPAZINE) 10 MG tablet Take 1 tablet (10 mg total) by mouth every 6 (six) hours as needed for nausea or vomiting. 30 tablet 1   ramipril (ALTACE) 2.5 MG tablet Take 2.5 mg by mouth daily.       simvastatin (ZOCOR) 40 MG tablet Take 40 mg by mouth at bedtime.       tiZANidine (ZANAFLEX) 4 MG tablet Take 2 mg by mouth daily as needed for muscle spasms.     traZODone (DESYREL) 50 MG tablet Take 50 mg by mouth at bedtime.      venlafaxine (EFFEXOR) 75 MG tablet Take 75 mg by mouth daily.     vitamin E 400 UNIT capsule Take 400 Units by mouth daily.     zonisamide (ZONEGRAN) 50 MG capsule Take 50-100 mg by mouth  See admin instructions. Take 50mg  by mouth in the morning and 100mg  at night.     No current facility-administered medications for this visit.    REVIEW OF SYSTEMS:    10 Point review of Systems was done is negative except as noted above.   PHYSICAL EXAMINATION: GENERAL:alert, in no acute distress and comfortable SKIN: no acute rashes, no significant lesions EYES: conjunctiva are pink and non-injected,  sclera anicteric OROPHARYNX: MMM, no exudates, no oropharyngeal erythema or ulceration NECK: supple, no JVD LYMPH:  no palpable lymphadenopathy in the cervical, axillary or inguinal regions LUNGS: clear to auscultation b/l with normal respiratory effort HEART: regular rate & rhythm ABDOMEN:  normoactive bowel sounds , non tender, not distended. Extremity: no pedal edema PSYCH: alert & oriented x 3 with fluent speech NEURO: no focal motor/sensory deficits  LABORATORY DATA:  I have reviewed the data as listed    Latest Ref Rng & Units 09/25/2023   12:02 PM 09/18/2023    8:28 AM 08/03/2023   11:05 AM  CBC  WBC 4.0 - 10.5 K/uL 4.6  3.0  4.0   Hemoglobin 12.0 - 15.0 g/dL 16.1  09.6  04.5   Hematocrit 36.0 - 46.0 % 40.6  40.7  41.8   Platelets 150 - 400 K/uL 128  93  107       Latest Ref Rng & Units 09/25/2023   12:02 PM 09/18/2023    8:28 AM 08/03/2023   11:05 AM  CMP  Glucose 70 - 99 mg/dL 409  98  87   BUN 8 - 23 mg/dL 16  13  13    Creatinine 0.44 - 1.00 mg/dL 8.11  9.14  7.82   Sodium 135 - 145 mmol/L 139  141  141   Potassium 3.5 - 5.1 mmol/L 4.5  4.0  4.3   Chloride 98 - 111 mmol/L 106  108  104   CO2 22 - 32 mmol/L 28  28  31    Calcium 8.9 - 10.3 mg/dL 95.6  9.1  21.3   Total Protein 6.5 - 8.1 g/dL 6.7  6.5  6.9   Total Bilirubin 0.0 - 1.2 mg/dL 0.6  0.5  0.7   Alkaline Phos 38 - 126 U/L 70  74  82   AST 15 - 41 U/L 20  20  24    ALT 0 - 44 U/L 16  13  19     Lab Results  Component Value Date   LDH 180 08/03/2023   Molecular Pathology 10/21/2022:    Surgical Pathology 10/04/22: A. LYMPH NODE, RIGHT LEVEL 2 DEEP CERVICAL, EXCISION:  -Diffuse large B-cell lymphoma  -See comment COMMENT: The sections show diffuse effacement of the lymph nodal architecture primarily by a population of large lymphoid cells characterized by vesicular chromatin and small nucleoli associated with apoptosis and brisk mitosis.  In some areas, the large atypical lymphoid proliferation extends  into the perinodal adipose tissue.  In this background, there are scattered variably sized and somewhat disrupted aggregates of primarily small lymphoid cells characterized by high nuclear cytoplasmic ratio, angulated nuclear contours and small to inconspicuous nucleoli. Flow cytometric analysis was performed [facility] [id number]) and shows a monoclonal, lambda restricted B-cell population expressing CD10.  In addition, immunohistochemical stains for CD3, CD5, CD10, CD20, PAX5, BCL6, Bcl-2, Ki-67, CD30, CD138, CD21, EBV in addition to in situ hybridization for kappa and lambda were performed with appropriate controls.  The large lymphoid cells are positive for CD20, PAX5, CD10, BCL6, Bcl-2, and partially for  cytoplasmic lambda.  No significant staining is seen with EBV, CD30, CD138 or cytoplasmic kappa.  Ki67 shows variably increased expression (more than 50% in some areas). CD21 highlights scattered somewhat disrupted follicular dendritic networks. The lymphoid aggregates of primarily small lymphoid cells previously described show positivity for B-cell markers CD20 and PAX5 in addition to CD10 and Bcl-2.  There is an admixed variable T-cell component in the background as seen with CD3 and CD5 and there is no apparent co-expression of CD5 in B-cell areas.  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    RADIOGRAPHIC STUDIES: I have personally reviewed the radiological images as listed and agreed with the findings in the report. CT HEAD W & WO CONTRAST ( ) Result Date: 09/07/2023 CLINICAL DATA:  Non-small cell lung cancer staging. History of lymphoma as well. EXAM: CT HEAD WITHOUT AND WITH CONTRAST TECHNIQUE: Contiguous axial images were obtained from the base of the skull through the vertex without and with intravenous contrast. RADIATION DOSE REDUCTION: This  exam was performed according to the departmental dose-optimization program which includes automated exposure control, adjustment of the mA and/or kV according to patient size and/or use of iterative reconstruction technique. CONTRAST:  75mL OMNIPAQUE IOHEXOL 300 MG/ML  SOLN COMPARISON:  CT head 11/28/2010 FINDINGS: Brain: There is no evidence of an acute infarct, intracranial hemorrhage, mass, midline shift, or extra-axial fluid collection. Cerebral volume is within normal limits for age. The ventricles are normal in size. No abnormal enhancement is identified. 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 Patent dural venous sinuses. Skull: 1.1 cm lytic lesion in the left frontal calvarium near the superior aspect of the frontal sinus, new from 2012 (series 3, image 19). Sinuses/Orbits: Visualized paranasal sinuses and mastoid air cells are clear. Bilateral cataract extraction. Other: None. IMPRESSION: 1. No evidence of intracranial metastatic disease. 2. 1.1 cm left frontal skull lesion, new from 2012 and indeterminate with a metastasis being a possibility. 3. 7 mm left posterior communicating aneurysm, similar to 2012. Electronically Signed   By: Sebastian Ache M.D.   On: 09/07/2023 16:59    NM PET 09/04/2022: IMPRESSION: 1. There is a large tracer avid mass centered within the left base of tongue and left lingual region which extends into the hypopharyngeal region ventral to the epiglottis. Imaging findings are compatible with a primary head and neck malignancy. 2. A smaller focus of increased uptake localizes to the right pharyngeal tonsil region. Indeterminate. 3. Bilateral tracer avid cervical and supraclavicular lymph nodes compatible with nodal metastasis. 4. There is a 7 mm tracer avid nodule within the anteromedial left upper lobe which is suspicious for pulmonary metastasis. 5. There is increased uptake identified along the long axis of  the posterior right seventh rib. No corresponding lytic or sclerotic changes identified on the CT images. Cannot exclude bone metastases. 6. Multiple prominent retroperitoneal and mesenteric lymph nodes are identified which exhibit mild tracer uptake. This is a nonspecific finding and may reflect reactive adenopathy. Metastatic adenopathy cannot be excluded. Attention on future surveillance imaging is advised. 7.  Aortic Atherosclerosis (ICD10-I70.0).   ASSESSMENT & PLAN:   78 y.o.  female with:   1. Diffuse large B-cell lymphoma -at least stage IIIA per PET CT scan -Presented as right sided sore throat with oropharyngeal mass noted on nasolaryngoscopy and bulky cervical adenopathy bilaterally - Biopsy 10/04/22 confirmed  diffuse large B-cell lymphoma. 2. 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 CBC shows low platelets of 128 K. CMP stable.  -Discussed PET scan results from 08/24/2023 in detail with the patient. Showed 1. Enlarging hypermetabolic RIGHT upper lobe nodule consistent with bronchogenic carcinoma. 2. New hypermetabolic RIGHT hilar and mediastinal adenopathy consistent with nodal metastasis. 3. Near complete resolution of hypermetabolic nodule in the LEFT upper lobe. 4. No evidence of metastatic disease in the abdomen pelvis. -Discussed with the patient that nausea seems to be from radiation treatment.  -Patient can take either compazine or Zofran prn for nausea.  -Patient tolerated cycle 1 of her treatment well without any new or severe toxicities.  -Patent can proceed with cycle 2 of her treatment without any dose modifications.  -Continue radiation treatment.  -Answered all the questions.   FOLLOW-UP: F/u for weekly carbo/taxol as scheduled MD visit as scheduled on 10/09/2023  The  total time spent in the appointment was 30 minutes* .  All of the patient's questions were answered with apparent satisfaction. The patient knows to call the clinic with any problems, questions or concerns.   Wyvonnia Lora MD MS AAHIVMS [facility] Hematology/Oncology Physician [facility]  .*Total Encounter Time as defined by the Centers for Medicare and Medicaid Services includes, in addition to the face-to-face time of a patient visit (documented in the note above) non-face-to-face time: obtaining and reviewing outside history, ordering and reviewing medications, tests or procedures, care coordination (communications with other health care professionals or caregivers) and documentation in the medical record.   I,Param Shah,acting as a Neurosurgeon for Wyvonnia Lora, MD.,have documented all relevant documentation on the behalf of Wyvonnia Lora, MD,as directed by  Wyvonnia Lora, MD while in the presence of Wyvonnia Lora, MD.  .I have reviewed the above documentation for accuracy and completeness, and I agree with the above. Johney Maine MD

## 2023-09-26 ENCOUNTER — Ambulatory Visit
Admission: RE | Admit: 2023-09-26 | Discharge: 2023-09-26 | Disposition: A | Discharge status: Home / Self Care | Source: Ambulatory Visit | Attending: Radiation Oncology

## 2023-09-26 ENCOUNTER — Other Ambulatory Visit: Payer: Self-pay

## 2023-09-26 DIAGNOSIS — Z5111 Encounter for antineoplastic chemotherapy: Secondary | ICD-10-CM | POA: Diagnosis not present

## 2023-09-26 DIAGNOSIS — C7801 Secondary malignant neoplasm of right lung: Secondary | ICD-10-CM | POA: Diagnosis not present

## 2023-09-26 DIAGNOSIS — C83398 Diffuse large b-cell lymphoma of other extranodal and solid organ sites: Secondary | ICD-10-CM | POA: Diagnosis not present

## 2023-09-26 DIAGNOSIS — C3411 Malignant neoplasm of upper lobe, right bronchus or lung: Secondary | ICD-10-CM | POA: Diagnosis not present

## 2023-09-26 DIAGNOSIS — Z510 Encounter for antineoplastic radiation therapy: Secondary | ICD-10-CM | POA: Diagnosis not present

## 2023-09-26 DIAGNOSIS — Z87891 Personal history of nicotine dependence: Secondary | ICD-10-CM | POA: Diagnosis not present

## 2023-09-26 LAB — RAD ONC ARIA SESSION SUMMARY
Course Elapsed Days: 7
Plan Fractions Treated to Date: 6
Plan Prescribed Dose Per Fraction: 2 Gy
Plan Total Fractions Prescribed: 33
Plan Total Prescribed Dose: 66 Gy
Reference Point Dosage Given to Date: 12 Gy
Reference Point Session Dosage Given: 2 Gy
Session Number: 6

## 2023-09-27 ENCOUNTER — Ambulatory Visit
Admission: RE | Admit: 2023-09-27 | Discharge: 2023-09-27 | Disposition: A | Discharge status: Home / Self Care | Source: Ambulatory Visit | Attending: Radiation Oncology | Admitting: Radiation Oncology

## 2023-09-27 ENCOUNTER — Other Ambulatory Visit: Payer: Self-pay

## 2023-09-27 DIAGNOSIS — Z87891 Personal history of nicotine dependence: Secondary | ICD-10-CM | POA: Diagnosis not present

## 2023-09-27 DIAGNOSIS — C7801 Secondary malignant neoplasm of right lung: Secondary | ICD-10-CM | POA: Diagnosis not present

## 2023-09-27 DIAGNOSIS — C3411 Malignant neoplasm of upper lobe, right bronchus or lung: Secondary | ICD-10-CM | POA: Diagnosis not present

## 2023-09-27 DIAGNOSIS — Z5111 Encounter for antineoplastic chemotherapy: Secondary | ICD-10-CM | POA: Diagnosis not present

## 2023-09-27 DIAGNOSIS — C83398 Diffuse large b-cell lymphoma of other extranodal and solid organ sites: Secondary | ICD-10-CM | POA: Diagnosis not present

## 2023-09-27 DIAGNOSIS — Z510 Encounter for antineoplastic radiation therapy: Secondary | ICD-10-CM | POA: Diagnosis not present

## 2023-09-27 LAB — RAD ONC ARIA SESSION SUMMARY
Course Elapsed Days: 8
Plan Fractions Treated to Date: 7
Plan Prescribed Dose Per Fraction: 2 Gy
Plan Total Fractions Prescribed: 33
Plan Total Prescribed Dose: 66 Gy
Reference Point Dosage Given to Date: 14 Gy
Reference Point Session Dosage Given: 2 Gy
Session Number: 7

## 2023-09-28 ENCOUNTER — Other Ambulatory Visit: Payer: Self-pay

## 2023-09-28 ENCOUNTER — Ambulatory Visit
Admission: RE | Admit: 2023-09-28 | Discharge: 2023-09-28 | Disposition: A | Discharge status: Home / Self Care | Source: Ambulatory Visit | Attending: Radiation Oncology | Admitting: Radiation Oncology

## 2023-09-28 DIAGNOSIS — C83398 Diffuse large b-cell lymphoma of other extranodal and solid organ sites: Secondary | ICD-10-CM | POA: Diagnosis not present

## 2023-09-28 DIAGNOSIS — C7801 Secondary malignant neoplasm of right lung: Secondary | ICD-10-CM | POA: Diagnosis not present

## 2023-09-28 DIAGNOSIS — Z510 Encounter for antineoplastic radiation therapy: Secondary | ICD-10-CM | POA: Diagnosis not present

## 2023-09-28 DIAGNOSIS — C3411 Malignant neoplasm of upper lobe, right bronchus or lung: Secondary | ICD-10-CM | POA: Diagnosis not present

## 2023-09-28 DIAGNOSIS — Z87891 Personal history of nicotine dependence: Secondary | ICD-10-CM | POA: Diagnosis not present

## 2023-09-28 DIAGNOSIS — Z5111 Encounter for antineoplastic chemotherapy: Secondary | ICD-10-CM | POA: Diagnosis not present

## 2023-09-28 LAB — RAD ONC ARIA SESSION SUMMARY
Course Elapsed Days: 9
Plan Fractions Treated to Date: 8
Plan Prescribed Dose Per Fraction: 2 Gy
Plan Total Fractions Prescribed: 33
Plan Total Prescribed Dose: 66 Gy
Reference Point Dosage Given to Date: 16 Gy
Reference Point Session Dosage Given: 2 Gy
Session Number: 8

## 2023-10-01 ENCOUNTER — Ambulatory Visit
Admission: RE | Admit: 2023-10-01 | Discharge: 2023-10-01 | Disposition: A | Discharge status: Home / Self Care | Source: Ambulatory Visit | Attending: Radiation Oncology | Admitting: Radiation Oncology

## 2023-10-01 ENCOUNTER — Other Ambulatory Visit: Payer: Self-pay

## 2023-10-01 DIAGNOSIS — C7801 Secondary malignant neoplasm of right lung: Secondary | ICD-10-CM | POA: Diagnosis not present

## 2023-10-01 DIAGNOSIS — Z510 Encounter for antineoplastic radiation therapy: Secondary | ICD-10-CM | POA: Diagnosis not present

## 2023-10-01 DIAGNOSIS — Z5111 Encounter for antineoplastic chemotherapy: Secondary | ICD-10-CM | POA: Diagnosis not present

## 2023-10-01 DIAGNOSIS — Z87891 Personal history of nicotine dependence: Secondary | ICD-10-CM | POA: Diagnosis not present

## 2023-10-01 DIAGNOSIS — C83398 Diffuse large b-cell lymphoma of other extranodal and solid organ sites: Secondary | ICD-10-CM | POA: Diagnosis not present

## 2023-10-01 DIAGNOSIS — C3411 Malignant neoplasm of upper lobe, right bronchus or lung: Secondary | ICD-10-CM | POA: Diagnosis not present

## 2023-10-01 LAB — RAD ONC ARIA SESSION SUMMARY
Course Elapsed Days: 12
Plan Fractions Treated to Date: 9
Plan Prescribed Dose Per Fraction: 2 Gy
Plan Total Fractions Prescribed: 33
Plan Total Prescribed Dose: 66 Gy
Reference Point Dosage Given to Date: 18 Gy
Reference Point Session Dosage Given: 2 Gy
Session Number: 9

## 2023-10-02 ENCOUNTER — Inpatient Hospital Stay

## 2023-10-02 ENCOUNTER — Other Ambulatory Visit: Payer: Self-pay

## 2023-10-02 ENCOUNTER — Ambulatory Visit: Admitting: Radiation Oncology

## 2023-10-02 ENCOUNTER — Ambulatory Visit
Admission: RE | Admit: 2023-10-02 | Discharge: 2023-10-02 | Disposition: A | Discharge status: Home / Self Care | Source: Ambulatory Visit | Attending: Radiation Oncology | Admitting: Radiation Oncology

## 2023-10-02 ENCOUNTER — Encounter: Payer: Self-pay | Admitting: Hematology

## 2023-10-02 VITALS — BP 127/64 | HR 72 | Temp 97.7°F | Resp 20 | Wt 155.8 lb

## 2023-10-02 DIAGNOSIS — Z7189 Other specified counseling: Secondary | ICD-10-CM

## 2023-10-02 DIAGNOSIS — C3491 Malignant neoplasm of unspecified part of right bronchus or lung: Secondary | ICD-10-CM

## 2023-10-02 DIAGNOSIS — Z5111 Encounter for antineoplastic chemotherapy: Secondary | ICD-10-CM | POA: Diagnosis not present

## 2023-10-02 DIAGNOSIS — Z510 Encounter for antineoplastic radiation therapy: Secondary | ICD-10-CM | POA: Diagnosis not present

## 2023-10-02 DIAGNOSIS — C7801 Secondary malignant neoplasm of right lung: Secondary | ICD-10-CM | POA: Diagnosis not present

## 2023-10-02 DIAGNOSIS — Z87891 Personal history of nicotine dependence: Secondary | ICD-10-CM | POA: Diagnosis not present

## 2023-10-02 DIAGNOSIS — C83398 Diffuse large b-cell lymphoma of other extranodal and solid organ sites: Secondary | ICD-10-CM | POA: Diagnosis not present

## 2023-10-02 DIAGNOSIS — Z95828 Presence of other vascular implants and grafts: Secondary | ICD-10-CM

## 2023-10-02 DIAGNOSIS — C3411 Malignant neoplasm of upper lobe, right bronchus or lung: Secondary | ICD-10-CM | POA: Diagnosis not present

## 2023-10-02 LAB — CBC WITH DIFFERENTIAL (CANCER CENTER ONLY)
Abs Immature Granulocytes: 0.01 10*3/uL (ref 0.00–0.07)
Basophils Absolute: 0 10*3/uL (ref 0.0–0.1)
Basophils Relative: 0 %
Eosinophils Absolute: 0 10*3/uL (ref 0.0–0.5)
Eosinophils Relative: 1 %
HCT: 37.1 % (ref 36.0–46.0)
Hemoglobin: 12.7 g/dL (ref 12.0–15.0)
Immature Granulocytes: 0 %
Lymphocytes Relative: 13 %
Lymphs Abs: 0.3 10*3/uL — ABNORMAL LOW (ref 0.7–4.0)
MCH: 31.4 pg (ref 26.0–34.0)
MCHC: 34.2 g/dL (ref 30.0–36.0)
MCV: 91.6 fL (ref 80.0–100.0)
Monocytes Absolute: 0.2 10*3/uL (ref 0.1–1.0)
Monocytes Relative: 7 %
Neutro Abs: 1.8 10*3/uL (ref 1.7–7.7)
Neutrophils Relative %: 79 %
Platelet Count: 110 10*3/uL — ABNORMAL LOW (ref 150–400)
RBC: 4.05 MIL/uL (ref 3.87–5.11)
RDW: 13.9 % (ref 11.5–15.5)
WBC Count: 2.3 10*3/uL — ABNORMAL LOW (ref 4.0–10.5)
nRBC: 0 % (ref 0.0–0.2)

## 2023-10-02 LAB — CMP (CANCER CENTER ONLY)
ALT: 18 U/L (ref 0–44)
AST: 19 U/L (ref 15–41)
Albumin: 3.9 g/dL (ref 3.5–5.0)
Alkaline Phosphatase: 63 U/L (ref 38–126)
Anion gap: 3 — ABNORMAL LOW (ref 5–15)
BUN: 13 mg/dL (ref 8–23)
CO2: 29 mmol/L (ref 22–32)
Calcium: 9.2 mg/dL (ref 8.9–10.3)
Chloride: 107 mmol/L (ref 98–111)
Creatinine: 0.84 mg/dL (ref 0.44–1.00)
GFR, Estimated: >60 mL/min (ref >60)
Glucose, Bld: 96 mg/dL (ref 70–99)
Potassium: 4.1 mmol/L (ref 3.5–5.1)
Sodium: 139 mmol/L (ref 135–145)
Total Bilirubin: 0.4 mg/dL (ref 0.0–1.2)
Total Protein: 6.1 g/dL — ABNORMAL LOW (ref 6.5–8.1)

## 2023-10-02 LAB — RAD ONC ARIA SESSION SUMMARY
Course Elapsed Days: 13
Plan Fractions Treated to Date: 10
Plan Prescribed Dose Per Fraction: 2 Gy
Plan Total Fractions Prescribed: 33
Plan Total Prescribed Dose: 66 Gy
Reference Point Dosage Given to Date: 20 Gy
Reference Point Session Dosage Given: 2 Gy
Session Number: 10

## 2023-10-02 MED ORDER — PACLITAXEL CHEMO INJECTION 300 MG/50ML
45.0000 mg/m2 | Freq: Once | INTRAVENOUS | Status: AC
  Administered 2023-10-02: 84 mg via INTRAVENOUS
  Filled 2023-10-02: qty 14

## 2023-10-02 MED ORDER — PALONOSETRON HCL INJECTION 0.25 MG/5ML
0.2500 mg | Freq: Once | INTRAVENOUS | Status: AC
  Administered 2023-10-02: 0.25 mg via INTRAVENOUS
  Filled 2023-10-02: qty 5

## 2023-10-02 MED ORDER — DIPHENHYDRAMINE HCL 50 MG/ML IJ SOLN
50.0000 mg | Freq: Once | INTRAMUSCULAR | Status: AC
  Administered 2023-10-02: 50 mg via INTRAVENOUS
  Filled 2023-10-02: qty 1

## 2023-10-02 MED ORDER — SODIUM CHLORIDE 0.9% FLUSH
10.0000 mL | INTRAVENOUS | Status: DC | PRN
Start: 2023-10-02 — End: 2023-10-02
  Administered 2023-10-02: 10 mL

## 2023-10-02 MED ORDER — SODIUM CHLORIDE 0.9 % IV SOLN
154.4000 mg | Freq: Once | INTRAVENOUS | Status: AC
  Administered 2023-10-02: 150 mg via INTRAVENOUS
  Filled 2023-10-02: qty 15

## 2023-10-02 MED ORDER — SODIUM CHLORIDE 0.9 % IV SOLN
INTRAVENOUS | Status: DC
Start: 2023-10-02 — End: 2023-10-02

## 2023-10-02 MED ORDER — HEPARIN SOD (PORK) LOCK FLUSH 100 UNIT/ML IV SOLN
500.0000 [IU] | Freq: Once | INTRAVENOUS | Status: AC | PRN
  Administered 2023-10-02: 500 [IU]

## 2023-10-02 MED ORDER — SODIUM CHLORIDE 0.9% FLUSH
10.0000 mL | Freq: Once | INTRAVENOUS | Status: AC
  Administered 2023-10-02: 10 mL

## 2023-10-02 MED ORDER — DEXAMETHASONE SODIUM PHOSPHATE 10 MG/ML IJ SOLN
10.0000 mg | Freq: Once | INTRAMUSCULAR | Status: AC
  Administered 2023-10-02: 10 mg via INTRAVENOUS
  Filled 2023-10-02: qty 1

## 2023-10-02 MED ORDER — FAMOTIDINE IN NACL 20-0.9 MG/50ML-% IV SOLN
20.0000 mg | Freq: Once | INTRAVENOUS | Status: AC
Start: 2023-10-02 — End: 2023-10-02
  Administered 2023-10-02: 20 mg via INTRAVENOUS
  Filled 2023-10-02: qty 50

## 2023-10-02 NOTE — Patient Instructions (Addendum)
 [facility] - A DEPT OF [facility]  Discharge Instructions: Thank you for choosing [facility] to provide your oncology and hematology care.   If you have a lab appointment with the Cancer Center, please go directly to the Cancer Center and check in at the registration area.   Wear comfortable clothing and clothing appropriate for easy access to any Portacath or PICC line.   We strive to give you quality time with your provider. You may need to reschedule your appointment if you arrive late (15 or more minutes).  Arriving late affects you and other patients whose appointments are after yours.  Also, if you miss three or more appointments without notifying the office, you may be dismissed from the clinic at the provider's discretion.      For prescription refill requests, have your pharmacy contact our office and allow 72 hours for refills to be completed.    Today you received the following chemotherapy and/or immunotherapy agents: CARBOplatin (PARAPLATIN) and PACLitaxel (TAXOL)     To help prevent nausea and vomiting after your treatment, we encourage you to take your nausea medication as directed.  BELOW ARE SYMPTOMS THAT SHOULD BE REPORTED IMMEDIATELY: *FEVER GREATER THAN 100.4 F (38 C) OR HIGHER *CHILLS OR SWEATING *NAUSEA AND VOMITING THAT IS NOT CONTROLLED WITH YOUR NAUSEA MEDICATION *UNUSUAL SHORTNESS OF BREATH *UNUSUAL BRUISING OR BLEEDING *URINARY PROBLEMS (pain or burning when urinating, or frequent urination) *BOWEL PROBLEMS (unusual diarrhea, constipation, pain near the anus) TENDERNESS IN MOUTH AND THROAT WITH OR WITHOUT PRESENCE OF ULCERS (sore throat, sores in mouth, or a toothache) UNUSUAL RASH, SWELLING OR PAIN  UNUSUAL VAGINAL DISCHARGE OR ITCHING   Items with * indicate a potential emergency and should be followed up as soon as possible or go to the Emergency Department if any problems should occur.  Please show the  CHEMOTHERAPY ALERT CARD or IMMUNOTHERAPY ALERT CARD at check-in to the Emergency Department and triage nurse.  Should you have questions after your visit or need to cancel or reschedule your appointment, please contact [facility] - A DEPT OF [facility]  Dept: [phone]  and follow the prompts.  Office hours are 8:00 a.m. to 4:30 p.m. Monday - Friday. Please note that voicemails left after 4:00 p.m. may not be returned until the following business day.  We are closed weekends and major holidays. You have access to a nurse at all times for urgent questions. Please call the main number to the clinic Dept: [phone] and follow the prompts.   For any non-urgent questions, you may also contact your provider using MyChart. We now offer e-Visits for anyone 3 and older to request care online for non-urgent symptoms. For details visit [email or web address].   Also download the MyChart app! Go to the app store, search "MyChart", open the app, select [facility], and log in with your MyChart username and password.  \[id number]\

## 2023-10-03 ENCOUNTER — Other Ambulatory Visit: Payer: Self-pay

## 2023-10-03 ENCOUNTER — Ambulatory Visit
Admission: RE | Admit: 2023-10-03 | Discharge: 2023-10-03 | Disposition: A | Discharge status: Home / Self Care | Source: Ambulatory Visit | Attending: Radiation Oncology

## 2023-10-03 DIAGNOSIS — Z87891 Personal history of nicotine dependence: Secondary | ICD-10-CM | POA: Diagnosis not present

## 2023-10-03 DIAGNOSIS — Z510 Encounter for antineoplastic radiation therapy: Secondary | ICD-10-CM | POA: Diagnosis not present

## 2023-10-03 DIAGNOSIS — C7801 Secondary malignant neoplasm of right lung: Secondary | ICD-10-CM | POA: Diagnosis not present

## 2023-10-03 DIAGNOSIS — C83398 Diffuse large b-cell lymphoma of other extranodal and solid organ sites: Secondary | ICD-10-CM | POA: Diagnosis not present

## 2023-10-03 DIAGNOSIS — C3411 Malignant neoplasm of upper lobe, right bronchus or lung: Secondary | ICD-10-CM | POA: Diagnosis not present

## 2023-10-03 DIAGNOSIS — Z5111 Encounter for antineoplastic chemotherapy: Secondary | ICD-10-CM | POA: Diagnosis not present

## 2023-10-03 LAB — RAD ONC ARIA SESSION SUMMARY
Course Elapsed Days: 14
Plan Fractions Treated to Date: 11
Plan Prescribed Dose Per Fraction: 2 Gy
Plan Total Fractions Prescribed: 33
Plan Total Prescribed Dose: 66 Gy
Reference Point Dosage Given to Date: 22 Gy
Reference Point Session Dosage Given: 2 Gy
Session Number: 11

## 2023-10-04 ENCOUNTER — Ambulatory Visit
Admission: RE | Admit: 2023-10-04 | Discharge: 2023-10-04 | Disposition: A | Discharge status: Home / Self Care | Source: Ambulatory Visit | Attending: Radiation Oncology | Admitting: Radiation Oncology

## 2023-10-04 ENCOUNTER — Ambulatory Visit: Admitting: Radiation Oncology

## 2023-10-04 ENCOUNTER — Other Ambulatory Visit: Payer: Self-pay

## 2023-10-04 DIAGNOSIS — Z87891 Personal history of nicotine dependence: Secondary | ICD-10-CM | POA: Diagnosis not present

## 2023-10-04 DIAGNOSIS — C7801 Secondary malignant neoplasm of right lung: Secondary | ICD-10-CM | POA: Diagnosis not present

## 2023-10-04 DIAGNOSIS — Z5111 Encounter for antineoplastic chemotherapy: Secondary | ICD-10-CM | POA: Diagnosis not present

## 2023-10-04 DIAGNOSIS — Z510 Encounter for antineoplastic radiation therapy: Secondary | ICD-10-CM | POA: Diagnosis not present

## 2023-10-04 DIAGNOSIS — C83398 Diffuse large b-cell lymphoma of other extranodal and solid organ sites: Secondary | ICD-10-CM | POA: Diagnosis not present

## 2023-10-04 DIAGNOSIS — C3411 Malignant neoplasm of upper lobe, right bronchus or lung: Secondary | ICD-10-CM | POA: Diagnosis not present

## 2023-10-04 LAB — RAD ONC ARIA SESSION SUMMARY
Course Elapsed Days: 15
Plan Fractions Treated to Date: 12
Plan Prescribed Dose Per Fraction: 2 Gy
Plan Total Fractions Prescribed: 33
Plan Total Prescribed Dose: 66 Gy
Reference Point Dosage Given to Date: 24 Gy
Reference Point Session Dosage Given: 2 Gy
Session Number: 12

## 2023-10-05 ENCOUNTER — Other Ambulatory Visit: Payer: Self-pay

## 2023-10-05 ENCOUNTER — Ambulatory Visit
Admission: RE | Admit: 2023-10-05 | Discharge: 2023-10-05 | Disposition: A | Discharge status: Home / Self Care | Source: Ambulatory Visit | Attending: Radiation Oncology | Admitting: Radiation Oncology

## 2023-10-05 DIAGNOSIS — C7801 Secondary malignant neoplasm of right lung: Secondary | ICD-10-CM | POA: Diagnosis not present

## 2023-10-05 DIAGNOSIS — Z5111 Encounter for antineoplastic chemotherapy: Secondary | ICD-10-CM | POA: Diagnosis not present

## 2023-10-05 DIAGNOSIS — C83398 Diffuse large b-cell lymphoma of other extranodal and solid organ sites: Secondary | ICD-10-CM | POA: Diagnosis not present

## 2023-10-05 DIAGNOSIS — Z87891 Personal history of nicotine dependence: Secondary | ICD-10-CM | POA: Diagnosis not present

## 2023-10-05 DIAGNOSIS — C3411 Malignant neoplasm of upper lobe, right bronchus or lung: Secondary | ICD-10-CM | POA: Diagnosis not present

## 2023-10-05 DIAGNOSIS — Z510 Encounter for antineoplastic radiation therapy: Secondary | ICD-10-CM | POA: Diagnosis not present

## 2023-10-05 LAB — RAD ONC ARIA SESSION SUMMARY
Course Elapsed Days: 16
Plan Fractions Treated to Date: 13
Plan Prescribed Dose Per Fraction: 2 Gy
Plan Total Fractions Prescribed: 33
Plan Total Prescribed Dose: 66 Gy
Reference Point Dosage Given to Date: 26 Gy
Reference Point Session Dosage Given: 2 Gy
Session Number: 13

## 2023-10-08 ENCOUNTER — Other Ambulatory Visit: Payer: Self-pay

## 2023-10-08 ENCOUNTER — Ambulatory Visit
Admission: RE | Admit: 2023-10-08 | Discharge: 2023-10-08 | Disposition: A | Discharge status: Home / Self Care | Source: Ambulatory Visit | Attending: Radiation Oncology

## 2023-10-08 DIAGNOSIS — Z5111 Encounter for antineoplastic chemotherapy: Secondary | ICD-10-CM | POA: Diagnosis not present

## 2023-10-08 DIAGNOSIS — C3411 Malignant neoplasm of upper lobe, right bronchus or lung: Secondary | ICD-10-CM | POA: Diagnosis not present

## 2023-10-08 DIAGNOSIS — Z510 Encounter for antineoplastic radiation therapy: Secondary | ICD-10-CM | POA: Diagnosis not present

## 2023-10-08 DIAGNOSIS — Z87891 Personal history of nicotine dependence: Secondary | ICD-10-CM | POA: Diagnosis not present

## 2023-10-08 DIAGNOSIS — C83398 Diffuse large b-cell lymphoma of other extranodal and solid organ sites: Secondary | ICD-10-CM | POA: Diagnosis not present

## 2023-10-08 DIAGNOSIS — C7801 Secondary malignant neoplasm of right lung: Secondary | ICD-10-CM | POA: Diagnosis not present

## 2023-10-08 LAB — RAD ONC ARIA SESSION SUMMARY
Course Elapsed Days: 19
Plan Fractions Treated to Date: 14
Plan Prescribed Dose Per Fraction: 2 Gy
Plan Total Fractions Prescribed: 33
Plan Total Prescribed Dose: 66 Gy
Reference Point Dosage Given to Date: 28 Gy
Reference Point Session Dosage Given: 2 Gy
Session Number: 14

## 2023-10-09 ENCOUNTER — Ambulatory Visit
Admission: RE | Admit: 2023-10-09 | Discharge: 2023-10-09 | Disposition: A | Discharge status: Home / Self Care | Source: Ambulatory Visit | Attending: Radiation Oncology | Admitting: Radiation Oncology

## 2023-10-09 ENCOUNTER — Inpatient Hospital Stay

## 2023-10-09 ENCOUNTER — Ambulatory Visit: Admitting: Radiation Oncology

## 2023-10-09 ENCOUNTER — Inpatient Hospital Stay: Admitting: Hematology

## 2023-10-09 ENCOUNTER — Other Ambulatory Visit: Payer: Self-pay

## 2023-10-09 VITALS — BP 123/59 | HR 86 | Temp 97.9°F | Resp 18 | Wt 157.3 lb

## 2023-10-09 DIAGNOSIS — Z95828 Presence of other vascular implants and grafts: Secondary | ICD-10-CM

## 2023-10-09 DIAGNOSIS — Z7189 Other specified counseling: Secondary | ICD-10-CM

## 2023-10-09 DIAGNOSIS — C3411 Malignant neoplasm of upper lobe, right bronchus or lung: Secondary | ICD-10-CM | POA: Insufficient documentation

## 2023-10-09 DIAGNOSIS — Z79899 Other long term (current) drug therapy: Secondary | ICD-10-CM | POA: Insufficient documentation

## 2023-10-09 DIAGNOSIS — Z87891 Personal history of nicotine dependence: Secondary | ICD-10-CM | POA: Diagnosis not present

## 2023-10-09 DIAGNOSIS — Z5111 Encounter for antineoplastic chemotherapy: Secondary | ICD-10-CM | POA: Insufficient documentation

## 2023-10-09 DIAGNOSIS — C7801 Secondary malignant neoplasm of right lung: Secondary | ICD-10-CM | POA: Diagnosis not present

## 2023-10-09 DIAGNOSIS — Z510 Encounter for antineoplastic radiation therapy: Secondary | ICD-10-CM | POA: Diagnosis not present

## 2023-10-09 DIAGNOSIS — C3491 Malignant neoplasm of unspecified part of right bronchus or lung: Secondary | ICD-10-CM

## 2023-10-09 DIAGNOSIS — C83398 Diffuse large b-cell lymphoma of other extranodal and solid organ sites: Secondary | ICD-10-CM | POA: Diagnosis not present

## 2023-10-09 LAB — CBC WITH DIFFERENTIAL (CANCER CENTER ONLY)
Abs Immature Granulocytes: 0.01 10*3/uL (ref 0.00–0.07)
Basophils Absolute: 0 10*3/uL (ref 0.0–0.1)
Basophils Relative: 1 %
Eosinophils Absolute: 0 10*3/uL (ref 0.0–0.5)
Eosinophils Relative: 1 %
HCT: 36.4 % (ref 36.0–46.0)
Hemoglobin: 12.4 g/dL (ref 12.0–15.0)
Immature Granulocytes: 1 %
Lymphocytes Relative: 11 %
Lymphs Abs: 0.2 10*3/uL — ABNORMAL LOW (ref 0.7–4.0)
MCH: 31.1 pg (ref 26.0–34.0)
MCHC: 34.1 g/dL (ref 30.0–36.0)
MCV: 91.2 fL (ref 80.0–100.0)
Monocytes Absolute: 0.2 10*3/uL (ref 0.1–1.0)
Monocytes Relative: 10 %
Neutro Abs: 1.6 10*3/uL — ABNORMAL LOW (ref 1.7–7.7)
Neutrophils Relative %: 76 %
Platelet Count: 97 10*3/uL — ABNORMAL LOW (ref 150–400)
RBC: 3.99 MIL/uL (ref 3.87–5.11)
RDW: 14.1 % (ref 11.5–15.5)
WBC Count: 2.1 10*3/uL — ABNORMAL LOW (ref 4.0–10.5)
nRBC: 0 % (ref 0.0–0.2)

## 2023-10-09 LAB — CMP (CANCER CENTER ONLY)
ALT: 16 U/L (ref 0–44)
AST: 16 U/L (ref 15–41)
Albumin: 4 g/dL (ref 3.5–5.0)
Alkaline Phosphatase: 59 U/L (ref 38–126)
Anion gap: 5 (ref 5–15)
BUN: 12 mg/dL (ref 8–23)
CO2: 29 mmol/L (ref 22–32)
Calcium: 9.2 mg/dL (ref 8.9–10.3)
Chloride: 107 mmol/L (ref 98–111)
Creatinine: 0.64 mg/dL (ref 0.44–1.00)
GFR, Estimated: >60 mL/min (ref >60)
Glucose, Bld: 108 mg/dL — ABNORMAL HIGH (ref 70–99)
Potassium: 4 mmol/L (ref 3.5–5.1)
Sodium: 141 mmol/L (ref 135–145)
Total Bilirubin: 0.4 mg/dL (ref 0.0–1.2)
Total Protein: 6.3 g/dL — ABNORMAL LOW (ref 6.5–8.1)

## 2023-10-09 LAB — RAD ONC ARIA SESSION SUMMARY
Course Elapsed Days: 20
Plan Fractions Treated to Date: 15
Plan Prescribed Dose Per Fraction: 2 Gy
Plan Total Fractions Prescribed: 33
Plan Total Prescribed Dose: 66 Gy
Reference Point Dosage Given to Date: 30 Gy
Reference Point Session Dosage Given: 2 Gy
Session Number: 15

## 2023-10-09 MED ORDER — PALONOSETRON HCL INJECTION 0.25 MG/5ML
0.2500 mg | Freq: Once | INTRAVENOUS | Status: AC
  Administered 2023-10-09: 0.25 mg via INTRAVENOUS
  Filled 2023-10-09: qty 5

## 2023-10-09 MED ORDER — SODIUM CHLORIDE 0.9 % IV SOLN
45.0000 mg/m2 | Freq: Once | INTRAVENOUS | Status: AC
Start: 2023-10-09 — End: 2023-10-09
  Administered 2023-10-09: 84 mg via INTRAVENOUS
  Filled 2023-10-09: qty 14

## 2023-10-09 MED ORDER — DIPHENHYDRAMINE HCL 50 MG/ML IJ SOLN
50.0000 mg | Freq: Once | INTRAMUSCULAR | Status: AC
  Administered 2023-10-09: 50 mg via INTRAVENOUS
  Filled 2023-10-09: qty 1

## 2023-10-09 MED ORDER — SODIUM CHLORIDE 0.9 % IV SOLN: INTRAVENOUS | Status: DC

## 2023-10-09 MED ORDER — CARBOPLATIN CHEMO INJECTION 450 MG/45ML
154.4000 mg | Freq: Once | INTRAVENOUS | Status: AC
  Administered 2023-10-09: 150 mg via INTRAVENOUS
  Filled 2023-10-09: qty 15

## 2023-10-09 MED ORDER — FAMOTIDINE IN NACL 20-0.9 MG/50ML-% IV SOLN
20.0000 mg | Freq: Once | INTRAVENOUS | Status: AC
  Administered 2023-10-09: 20 mg via INTRAVENOUS
  Filled 2023-10-09: qty 50

## 2023-10-09 MED ORDER — SODIUM CHLORIDE 0.9% FLUSH
10.0000 mL | Freq: Once | INTRAVENOUS | Status: AC
  Administered 2023-10-09: 10 mL

## 2023-10-09 MED ORDER — DEXAMETHASONE SODIUM PHOSPHATE 10 MG/ML IJ SOLN
10.0000 mg | Freq: Once | INTRAMUSCULAR | Status: AC
  Administered 2023-10-09: 10 mg via INTRAVENOUS
  Filled 2023-10-09: qty 1

## 2023-10-09 NOTE — Progress Notes (Signed)
 Patient seen by Dr. Addison Naegeli are within treatment parameters.  Labs reviewed: and are not all within treatment parameters.    Dr Candise Che aware: Plts 97, ANC: 1.6, CMP pending  Per physician team, patient is ready for treatment and there are NO modifications to the treatment plan.

## 2023-10-09 NOTE — Progress Notes (Signed)
 HEMATOLOGY/ONCOLOGY CLINIC NOTE  Date of Service: 10/09/23  Patient Care Team: Eunice Blase, PA-C as PCP - General (Internal Medicine) Marinus Maw, MD as PCP - Cardiology (Cardiology) Ebbie Ridge, MD (Family Medicine) Johney Maine, MD as Consulting Physician (Hematology)  CHIEF COMPLAINTS/PURPOSE OF CONSULTATION:  Management of recently diagnosed non small cel lung cancer Hx of DLBCL   HISTORY OF PRESENTING ILLNESS:   Rachel Bates is a wonderful 78 y.o. female who has been referred to [place] by Carylon Perches, DO for evaluation and management of non hodgkin's lymphoma. She has a hx of chronic systolic heart failure.  Nasolaryngoscopy revealed a mass lesion of the left oropharynx, emanating from the left base of tongue/glossotonsillar sulcus. Patient subsequently had a CT neck with contrast performed on 08/09/2022, which reported extensive bilateral adenopaty, with greatest lymph node conglomerate measuring 3.2 x 2.4 cm in the right level 2. Patient underwent core needle biopsy on 08/18/22 and endorsed persistent throat pain and odynophagia at the time. She is a former smoker and quit in the late 90s. SHe endorsed heavy tobacco use prior to quitting, with 1.5 pack/day for about 30 years.   Today, she is accompanied by her daughter. She complains of a sore throat beginning around Thanksgiving time. She was given antibiotics and prednisone which did not improve symptoms. She endorses swelling in her neck with associated soreness which came quickly past 2-3 weeks. She reports that her neck edema has recently grown on both the left and right sides. Her neck pain is in a specific area. She also complains of recent her neck pain if she turns her neck.  She did present Emergency EMT because something was stuck in her throat. Endoscopy revealed that it was food and it was pushed down. An ulcer was also found in her esophagus and some narrowing which will be stretched in  three months. She has not had any issues with swallowing food since then. She has not had a biopsy of issue on the back of her tongue. No other lumps/bumps, unexplained fevers, chills, night sweat, change in breathing, abdominal pain, or skin rashes. She reports discomfort with drinking tea which caused her to eat less as well.   She is allergic to Sumatriptan and another medication she cannot recall. She does not consume alcohol and has quit smoking in the 90s. 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 She believes the sore was caused by shoe discomfort.  She denies any facial puffiness, abdominal pain, recent change in bowel habits or urination. She reports that her defibrillator has never gone off.   She reports a ganglion cyst on her right knee, 2 knee replacements, and 2 hip replacements. She denies a Fhx of blood disorders. However, her sister has lung cancer and was a frequent smoker.  She is UTD with her vaccinations, incuding influenza, RSV, and COVID-19 booster. She has endorsed migraines since childhood. She lives on her own is able to complete daily activities independently. She will follow-up with Dr. Marene Lenz soon. She follows up with cardiologist regularly. Her a fib, pacemaker, and ICD device have been stable. She has not had a recent echo.   INTERVAL HISTORY:  Rachel Bates is a 78 y.o. female who presents today for follow up and management of squamous cell carcinoma of the lung.  She is here for cycle 4 day 1 of her CARBOplatin plus  PACLitaxel treatment.   Patient was last seen by me on 09/25/2023 and she complained of nausea and mild fatigue.   Patient notes she has been doing well overall since our last visit. She complains of mild sore throat and occasional nausea. She attributes the sore throat to pollen allergy. She takes Zofran, which helps her nausea symptoms.   She denies any  new infection issues, fever, chills, night sweats, cough, SOB, unexpected weight loss, back pain, chest pain, abdominal pain, or leg swelling.   Patient has been tolerating her current chemotherapy well without any new or severe toxicities.   MEDICAL HISTORY:  Past Medical History:  Diagnosis Date   AICD (automatic cardioverter/defibrillator) present dual   [employer] ---  original placedment 2007/  generator change 2014 by dr gregg taylor   Anemia    Anticoagulant long-term use    eliquis   Anxiety    Arthralgia of multiple joints    Arthritis pain    Benign hypertensive heart disease    CAD (coronary artery disease) primary cardiologist-- dr gregg taylor   MI and 2 stents 1995 in [place]   Cancer [facility])    CHF NYHA class II, chronic, systolic (HCC)    followed by dr Sharlot Gowda taylor   Degenerative disc disease, lumbar    Depression    Dyslipidemia    History of basal cell carcinoma (BCC) excision    right ankle area s/p  excision in office 06/ 2019  in office   History of DVT of lower extremity yrs ago before 2012   History of MI (myocardial infarction) 1995  in [place]   History of pulmonary embolus (PE) 2012   History of ventricular tachycardia    Hyperlipidemia    Hypersomnia    Hypertension    Ischemic cardiomyopathy    Migraines    Myocardial infarction (HCC)    OA (osteoarthritis)    "all over"   PAF (paroxysmal atrial fibrillation) (HCC)    Pneumonia    02/2023   Primary localized osteoarthritis of right hip 12/18/2017   Primary localized osteoarthritis of right knee 05/28/2018   S/P coronary artery stent placement 1995   in [facility]   05-21-2018 per pt x2  stents in same coronary artery (unsure BM or DES)   Vitamin D deficiency disease     SURGICAL HISTORY: Past Surgical History:  Procedure Laterality Date   BIOPSY  06/19/2022   Procedure: BIOPSY;  Surgeon: Kathi Der, MD;  [facility];  Service: Gastroenterology;;   BRONCHIAL BIOPSY  08/07/2023    Procedure: BRONCHIAL BIOPSIES;  Surgeon: Leslye Peer, MD;  Location: [facility] ENDOSCOPY;  Service: Pulmonary;;   BRONCHIAL BRUSHINGS  08/07/2023   Procedure: BRONCHIAL BRUSHINGS;  Surgeon: Leslye Peer, MD;  Location: [facility] ENDOSCOPY;  Service: Pulmonary;;   BRONCHIAL NEEDLE ASPIRATION BIOPSY  08/07/2023   Procedure: BRONCHIAL NEEDLE ASPIRATION BIOPSIES;  Surgeon: Leslye Peer, MD;  Location: [facility];  Service: Pulmonary;;   CARDIAC DEFIBRILLATOR PLACEMENT  2007   CATARACT EXTRACTION, BILATERAL     COLONOSCOPY  04/14/2013   colonic polyp, status post polypectomy. Mild panocolonic diverticulosis. Small internal hemorrhoids   DILATION AND CURETTAGE OF UTERUS  yrs ago   DIRECT LARYNGOSCOPY Right 10/04/2022   Procedure: DIRECT LARYNGOSCOPY;  Surgeon: Cheron Schaumann A, DO;  Location: [facility] OR;  Service: ENT;  Laterality: Right;   ESOPHAGOGASTRODUODENOSCOPY (EGD) WITH PROPOFOL N/A 06/19/2022   Procedure: ESOPHAGOGASTRODUODENOSCOPY (EGD) WITH PROPOFOL;  Surgeon: Kathi Der, MD;  [facility];  Service: Gastroenterology;  Laterality: N/A;   FIDUCIAL MARKER PLACEMENT  08/07/2023   Procedure: FIDUCIAL MARKER PLACEMENT;  Surgeon: Leslye Peer, MD;  Location: [facility] ENDOSCOPY;  Service: Pulmonary;;   IMPACTION REMOVAL  06/19/2022   Procedure: IMPACTION REMOVAL;  Surgeon: Kathi Der, MD;  [facility];  Service: Gastroenterology;;   IMPLANTABLE CARDIOVERTER DEFIBRILLATOR GENERATOR CHANGE N/A 02/04/2013   Procedure: IMPLANTABLE CARDIOVERTER DEFIBRILLATOR GENERATOR CHANGE;  Surgeon: Marinus Maw, MD;  Location: [facility] CATH LAB;  Service: Cardiovascular;  Laterality: N/A;   IR IMAGING GUIDED PORT INSERTION  10/27/2022   LYMPH NODE BIOPSY Right 10/04/2022   Procedure: EXCISIONAL OF RIGHT DEEP CERVICAL LYMPH NODE;  Surgeon: Laren Boom, DO;  Location: [facility];  Service: ENT;  Laterality: Right;   SHOULDER ARTHROSCOPY Right 2015   TOTAL HIP ARTHROPLASTY Right 12/18/2017    Procedure: RIGHT TOTAL HIP ARTHROPLASTY;  Surgeon: Teryl Lucy, MD;  Location: [facility];  Service: Orthopedics;  Laterality: Right;   TOTAL HIP ARTHROPLASTY Left 04/19/2021   Procedure: TOTAL HIP ARTHROPLASTY;  Surgeon: Teryl Lucy, MD;  [facility];  Service: Orthopedics;  Laterality: Left;   TOTAL KNEE ARTHROPLASTY Right 05/28/2018   Procedure: TOTAL KNEE ARTHROPLASTY;  Surgeon: Teryl Lucy, MD;  [facility];  Service: Orthopedics;  Laterality: Right;  Adductor Block   TOTAL KNEE ARTHROPLASTY Left 08/12/2021   Procedure: TOTAL KNEE ARTHROPLASTY;  Surgeon: Teryl Lucy, MD;  Location[facility];  Service: Orthopedics;  Laterality: Left;   TUBAL LIGATION Bilateral yrs ago   VIDEO BRONCHOSCOPY WITH RADIAL ENDOBRONCHIAL ULTRASOUND  08/07/2023   Procedure: VIDEO BRONCHOSCOPY WITH RADIAL ENDOBRONCHIAL ULTRASOUND;  Surgeon: Leslye Peer, MD;  [facility];  Service: Pulmonary;;   WISDOM TOOTH EXTRACTION      SOCIAL HISTORY: Social History   Socioeconomic History   Marital status: Single    Spouse name: Not on file   Number of children: 0   Years of education: 14   Highest education level: Not on file  Occupational History   Not on file  Tobacco Use   Smoking status: Former    Current packs/day: 0.00    Types: Cigarettes    Start date: 05/21/1966    Quit date: 05/21/1996    Years since quitting: 27.4   Smokeless tobacco: Never  Vaping Use   Vaping status: Never Used  Substance and Sexual Activity   Alcohol use: No   Drug use: Never   Sexual activity: Not on file  Other Topics Concern   Not on file  Social History Narrative   Not on file   Social Drivers of Health   Financial Resource Strain: Low Risk  (05/28/2018)   Overall Financial Resource Strain (CARDIA)    Difficulty of Paying Living Expenses: Not hard at all  Food Insecurity: No Food Insecurity (08/28/2023)   Hunger Vital Sign    Worried About Running Out of Food in the Last Year: Never true     Ran Out of Food in the Last Year: Never true  Transportation Needs: No Transportation Needs (08/28/2023)   PRAPARE - Administrator, Civil Service (Medical): No    Lack of Transportation (Non-Medical): No  Physical Activity: Not on file  Stress: Not on file  Social Connections: Not on file  Intimate Partner Violence: Not At Risk (08/28/2023)   Humiliation, Afraid, Rape, and Kick questionnaire    Fear of Current or Ex-Partner: No    Emotionally Abused: No    Physically Abused: No  Sexually Abused: No    FAMILY HISTORY: Family History  Problem Relation Age of Onset   Hypertension Mother    Thyroid disease Mother    Alzheimer's disease Mother    Coronary artery disease Father    Pulmonary embolism Father    Congestive Heart Failure Maternal Grandmother    Hypertension Maternal Grandmother    Heart attack Maternal Grandfather    Other Maternal Grandfather        carotid disease   Dementia Paternal Grandmother    Other Paternal Grandfather 74       accident    ALLERGIES:  is allergic to sumatriptan succinate and amitriptyline.  MEDICATIONS:  Current Outpatient Medications  Medication Sig Dispense Refill   acetaminophen (TYLENOL) 650 MG CR tablet Take 1,300 mg by mouth every 8 (eight) hours as needed for pain.     apixaban (ELIQUIS) 5 MG TABS tablet Take 1 tablet (5 mg total) by mouth 2 (two) times daily. Okay to restart this medication on 08/08/2023     b complex vitamins capsule Take 1 capsule by mouth daily.     Calcium Carb-Cholecalciferol (CALCIUM 600 + D PO) Take 1 tablet by mouth daily.     carvedilol (COREG) 12.5 MG tablet Take 12.5 mg by mouth 2 (two) times daily with a meal.      Cholecalciferol (VITAMIN D3) 125 MCG (5000 UT) capsule Take 5,000 Units by mouth daily.     cyanocobalamin (VITAMIN B12) 1000 MCG tablet Take 1,000 mcg by mouth daily.     dexamethasone (DECADRON) 4 MG tablet Take 2 tablets daily for 2 days, start the day after chemotherapy.  Take with food. 30 tablet 1   FIBER PO Take 1 capsule by mouth 2 (two) times daily.     Fiber POWD Take 1 Scoop by mouth daily.     gabapentin (NEURONTIN) 300 MG capsule Take 300 mg by mouth 3 (three) times daily.     lidocaine-prilocaine (EMLA) cream Apply to affected area once 30 g 3   loratadine (CLARITIN) 10 MG tablet Take 10 mg by mouth daily.     Omega-3 Fatty Acids (FISH OIL) 1000 MG CAPS Take 1,000 mg by mouth daily.      ondansetron (ZOFRAN) 8 MG tablet Take 1 tablet (8 mg total) by mouth every 8 (eight) hours as needed for nausea or vomiting. Start on the third day after chemotherapy. 30 tablet 1   prochlorperazine (COMPAZINE) 10 MG tablet Take 1 tablet (10 mg total) by mouth every 6 (six) hours as needed for nausea or vomiting. 30 tablet 1   ramipril (ALTACE) 2.5 MG tablet Take 2.5 mg by mouth daily.       simvastatin (ZOCOR) 40 MG tablet Take 40 mg by mouth at bedtime.       tiZANidine (ZANAFLEX) 4 MG tablet Take 2 mg by mouth daily as needed for muscle spasms.     traZODone (DESYREL) 50 MG tablet Take 50 mg by mouth at bedtime.      venlafaxine (EFFEXOR) 75 MG tablet Take 75 mg by mouth daily.     vitamin E 400 UNIT capsule Take 400 Units by mouth daily.     zonisamide (ZONEGRAN) 50 MG capsule Take 50-100 mg by mouth See admin instructions. Take 50mg  by mouth in the morning and 100mg  at night.     No current facility-administered medications for this visit.    REVIEW OF SYSTEMS:    10 Point review of Systems was done is negative  except as noted above.   PHYSICAL EXAMINATION: .BP (!) 123/59 (BP Location: Left Arm, Patient Position: Sitting)   Pulse 86   Temp 97.9 F (36.6 C) (Temporal)   Resp 18   Wt 157 lb 4.8 oz (71.4 kg)   SpO2 96%   BMI 24.83 kg/m   GENERAL:alert, in no acute distress and comfortable SKIN: no acute rashes, no significant lesions EYES: conjunctiva are pink and non-injected, sclera anicteric OROPHARYNX: MMM, no exudates, no oropharyngeal erythema  or ulceration NECK: supple, no JVD LYMPH:  no palpable lymphadenopathy in the cervical, axillary or inguinal regions LUNGS: clear to auscultation b/l with normal respiratory effort HEART: regular rate & rhythm ABDOMEN:  normoactive bowel sounds , non tender, not distended. Extremity: no pedal edema PSYCH: alert & oriented x 3 with fluent speech NEURO: no focal motor/sensory deficits  LABORATORY DATA:  I have reviewed the data as listed    Latest Ref Rng & Units 10/09/2023    1:14 PM 10/02/2023    1:45 PM 09/25/2023   12:02 PM  CBC  WBC 4.0 - 10.5 K/uL 2.1  2.3  4.6   Hemoglobin 12.0 - 15.0 g/dL 69.6  29.5  28.4   Hematocrit 36.0 - 46.0 % 36.4  37.1  40.6   Platelets 150 - 400 K/uL 97  110  128       Latest Ref Rng & Units 10/09/2023    1:14 PM 10/02/2023    1:45 PM 09/25/2023   12:02 PM  CMP  Glucose 70 - 99 mg/dL 132  96  440   BUN 8 - 23 mg/dL 12  13  16    Creatinine 0.44 - 1.00 mg/dL 1.02  7.25  3.66   Sodium 135 - 145 mmol/L 141  139  139   Potassium 3.5 - 5.1 mmol/L 4.0  4.1  4.5   Chloride 98 - 111 mmol/L 107  107  106   CO2 22 - 32 mmol/L 29  29  28    Calcium 8.9 - 10.3 mg/dL 9.2  9.2  44.0   Total Protein 6.5 - 8.1 g/dL 6.3  6.1  6.7   Total Bilirubin 0.0 - 1.2 mg/dL 0.4  0.4  0.6   Alkaline Phos 38 - 126 U/L 59  63  70   AST 15 - 41 U/L 16  19  20    ALT 0 - 44 U/L 16  18  16     Lab Results  Component Value Date   LDH 180 08/03/2023   Molecular Pathology 10/21/2022:    Surgical Pathology 10/04/22: A. LYMPH NODE, RIGHT LEVEL 2 DEEP CERVICAL, EXCISION:  -Diffuse large B-cell lymphoma  -See comment COMMENT: The sections show diffuse effacement of the lymph nodal architecture primarily by a population of large lymphoid cells characterized by vesicular chromatin and small nucleoli associated with apoptosis and brisk mitosis.  In some areas, the large atypical lymphoid proliferation extends into the perinodal adipose tissue.  In this background, there are  scattered variably sized and somewhat disrupted aggregates of primarily small lymphoid cells characterized by high nuclear cytoplasmic ratio, angulated nuclear contours and small to inconspicuous nucleoli. Flow cytometric analysis was performed [facility] [phone]) and shows a monoclonal, lambda restricted B-cell population expressing CD10.  In addition, immunohistochemical stains for CD3, CD5, CD10, CD20, PAX5, BCL6, Bcl-2, Ki-67, CD30, CD138, CD21, EBV in addition to in situ hybridization for kappa and lambda were performed with appropriate controls.  The large lymphoid cells are positive for CD20, PAX5, CD10,  BCL6, Bcl-2, and partially for cytoplasmic lambda.  No significant staining is seen with EBV, CD30, CD138 or cytoplasmic kappa.  Ki67 shows variably increased expression (more than 50% in some areas). CD21 highlights scattered somewhat disrupted follicular dendritic networks. The lymphoid aggregates of primarily small lymphoid cells previously described show positivity for B-cell markers CD20 and PAX5 in addition to CD10 and Bcl-2.  There is an admixed variable T-cell component in the background as seen with CD3 and CD5 and there is no apparent co-expression of CD5 in B-cell areas.  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    RADIOGRAPHIC STUDIES: I have personally reviewed the radiological images as listed and agreed with the findings in the report. No results found.   NM PET 09/04/2022: IMPRESSION: 1. There is a large tracer avid mass centered within the left base of tongue and left lingual region which extends into the hypopharyngeal region ventral to the epiglottis. Imaging findings are compatible with a primary head and neck malignancy. 2. A smaller focus of increased uptake localizes to the right pharyngeal tonsil region.  Indeterminate. 3. Bilateral tracer avid cervical and supraclavicular lymph nodes compatible with nodal metastasis. 4. There is a 7 mm tracer avid nodule within the anteromedial left upper lobe which is suspicious for pulmonary metastasis. 5. There is increased uptake identified along the long axis of the posterior right seventh rib. No corresponding lytic or sclerotic changes identified on the CT images. Cannot exclude bone metastases. 6. Multiple prominent retroperitoneal and mesenteric lymph nodes are identified which exhibit mild tracer uptake. This is a nonspecific finding and may reflect reactive adenopathy. Metastatic adenopathy cannot be excluded. Attention on future surveillance imaging is advised. 7.  Aortic Atherosclerosis (ICD10-I70.0).   ASSESSMENT & PLAN:   78 y.o.  female with:   1. 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 2. 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 CBC shows low WBC of 2.1 K and low platelets of 97 K. CMP stable. -Patient tolerated cycle 3 of her treatment well without any new or severe toxicities.  -Patent can proceed with cycle 4 of her treatment without any dose modifications. Mild Leucopenia without neutropenia. Primarily lymphopenia from RT -Continue radiation treatment. -Answered all the questions.  -Discussed with the patient that we might hold next treatment cycle if she continues to be neutropenic.  FOLLOW-UP: F/u as per currently scheduled appointments  The total time spent in the appointment was 30 minutes* .  All  of the patient's questions were answered with apparent satisfaction. The patient knows to call the clinic with any problems, questions or concerns.   Wyvonnia Lora MD MS AAHIVMS [facility] Hematology/Oncology Physician [facility]  .*Total Encounter Time as defined by the Centers for Medicare and Medicaid Services includes, in addition to the face-to-face time of a patient visit (documented in the note above) non-face-to-face time: obtaining and reviewing outside history, ordering and reviewing medications, tests or procedures, care coordination (communications with other health care professionals or caregivers) and documentation in the medical  record.   I,Param Shah,acting as a Neurosurgeon for Wyvonnia Lora, MD.,have documented all relevant documentation on the behalf of Wyvonnia Lora, MD,as directed by  Wyvonnia Lora, MD while in the presence of Wyvonnia Lora, MD.  .I have reviewed the above documentation for accuracy and completeness, and I agree with the above. Johney Maine MD

## 2023-10-09 NOTE — Patient Instructions (Signed)
 [facility]  Discharge Instructions: Thank you for choosing [facility] to provide your oncology and hematology care.   If you have a lab appointment with the Cancer Center, please go directly to the Cancer Center and check in at the registration area.   Wear comfortable clothing and clothing appropriate for easy access to any Portacath or PICC line.   We strive to give you quality time with your provider. You may need to reschedule your appointment if you arrive late (15 or more minutes).  Arriving late affects you and other patients whose appointments are after yours.  Also, if you miss three or more appointments without notifying the office, you may be dismissed from the clinic at the provider's discretion.      For prescription refill requests, have your pharmacy contact our office and allow 72 hours for refills to be completed.    Today you received the following chemotherapy and/or immunotherapy agents: CARBOplatin (PARAPLATIN) and PACLitaxel (TAXOL)     To help prevent nausea and vomiting after your treatment, we encourage you to take your nausea medication as directed.  BELOW ARE SYMPTOMS THAT SHOULD BE REPORTED IMMEDIATELY: *FEVER GREATER THAN 100.4 F (38 C) OR HIGHER *CHILLS OR SWEATING *NAUSEA AND VOMITING THAT IS NOT CONTROLLED WITH YOUR NAUSEA MEDICATION *UNUSUAL SHORTNESS OF BREATH *UNUSUAL BRUISING OR BLEEDING *URINARY PROBLEMS (pain or burning when urinating, or frequent urination) *BOWEL PROBLEMS (unusual diarrhea, constipation, pain near the anus) TENDERNESS IN MOUTH AND THROAT WITH OR WITHOUT PRESENCE OF ULCERS (sore throat, sores in mouth, or a toothache) UNUSUAL RASH, SWELLING OR PAIN  UNUSUAL VAGINAL DISCHARGE OR ITCHING   Items with * indicate a potential emergency and should be followed up as soon as possible or go to the Emergency Department if any problems should occur.  Please show the  CHEMOTHERAPY ALERT CARD or IMMUNOTHERAPY ALERT CARD at check-in to the Emergency Department and triage nurse.  Should you have questions after your visit or need to cancel or reschedule your appointment, please contact [facility]  Dept: [phone]  and follow the prompts.  Office hours are 8:00 a.m. to 4:30 p.m. Monday - Friday. Please note that voicemails left after 4:00 p.m. may not be returned until the following business day.  We are closed weekends and major holidays. You have access to a nurse at all times for urgent questions. Please call the main number to the clinic Dept: [phone] and follow the prompts.   For any non-urgent questions, you may also contact your provider using MyChart. We now offer e-Visits for anyone 3 and older to request care online for non-urgent symptoms. For details visit [email or web address].   Also download the MyChart app! Go to the app store, search "MyChart", open the app, select [place], and log in with your MyChart username and password.  \[id number]\

## 2023-10-10 ENCOUNTER — Ambulatory Visit
Admission: RE | Admit: 2023-10-10 | Discharge: 2023-10-10 | Disposition: A | Discharge status: Home / Self Care | Source: Ambulatory Visit | Attending: Radiation Oncology | Admitting: Radiation Oncology

## 2023-10-10 ENCOUNTER — Other Ambulatory Visit: Payer: Self-pay

## 2023-10-10 DIAGNOSIS — Z5111 Encounter for antineoplastic chemotherapy: Secondary | ICD-10-CM | POA: Diagnosis not present

## 2023-10-10 DIAGNOSIS — C83398 Diffuse large b-cell lymphoma of other extranodal and solid organ sites: Secondary | ICD-10-CM | POA: Diagnosis not present

## 2023-10-10 DIAGNOSIS — Z510 Encounter for antineoplastic radiation therapy: Secondary | ICD-10-CM | POA: Diagnosis not present

## 2023-10-10 DIAGNOSIS — C3411 Malignant neoplasm of upper lobe, right bronchus or lung: Secondary | ICD-10-CM | POA: Diagnosis not present

## 2023-10-10 DIAGNOSIS — C7801 Secondary malignant neoplasm of right lung: Secondary | ICD-10-CM | POA: Diagnosis not present

## 2023-10-10 DIAGNOSIS — Z87891 Personal history of nicotine dependence: Secondary | ICD-10-CM | POA: Diagnosis not present

## 2023-10-10 LAB — RAD ONC ARIA SESSION SUMMARY
Course Elapsed Days: 21
Plan Fractions Treated to Date: 16
Plan Prescribed Dose Per Fraction: 2 Gy
Plan Total Fractions Prescribed: 33
Plan Total Prescribed Dose: 66 Gy
Reference Point Dosage Given to Date: 32 Gy
Reference Point Session Dosage Given: 2 Gy
Session Number: 16

## 2023-10-11 ENCOUNTER — Ambulatory Visit
Admission: RE | Admit: 2023-10-11 | Discharge: 2023-10-11 | Disposition: A | Discharge status: Home / Self Care | Source: Ambulatory Visit | Attending: Radiation Oncology

## 2023-10-11 ENCOUNTER — Other Ambulatory Visit: Payer: Self-pay

## 2023-10-11 DIAGNOSIS — Z510 Encounter for antineoplastic radiation therapy: Secondary | ICD-10-CM | POA: Diagnosis not present

## 2023-10-11 DIAGNOSIS — C7801 Secondary malignant neoplasm of right lung: Secondary | ICD-10-CM | POA: Diagnosis not present

## 2023-10-11 DIAGNOSIS — C83398 Diffuse large b-cell lymphoma of other extranodal and solid organ sites: Secondary | ICD-10-CM | POA: Diagnosis not present

## 2023-10-11 LAB — RAD ONC ARIA SESSION SUMMARY
Course Elapsed Days: 22
Plan Fractions Treated to Date: 17
Plan Prescribed Dose Per Fraction: 2 Gy
Plan Total Fractions Prescribed: 33
Plan Total Prescribed Dose: 66 Gy
Reference Point Dosage Given to Date: 34 Gy
Reference Point Session Dosage Given: 2 Gy
Session Number: 17

## 2023-10-12 ENCOUNTER — Other Ambulatory Visit: Payer: Self-pay

## 2023-10-12 ENCOUNTER — Ambulatory Visit
Admission: RE | Admit: 2023-10-12 | Discharge: 2023-10-12 | Disposition: A | Discharge status: Home / Self Care | Source: Ambulatory Visit | Attending: Radiation Oncology | Admitting: Radiation Oncology

## 2023-10-12 DIAGNOSIS — C3411 Malignant neoplasm of upper lobe, right bronchus or lung: Secondary | ICD-10-CM | POA: Diagnosis not present

## 2023-10-12 DIAGNOSIS — Z87891 Personal history of nicotine dependence: Secondary | ICD-10-CM | POA: Diagnosis not present

## 2023-10-12 DIAGNOSIS — C3491 Malignant neoplasm of unspecified part of right bronchus or lung: Secondary | ICD-10-CM

## 2023-10-12 DIAGNOSIS — Z510 Encounter for antineoplastic radiation therapy: Secondary | ICD-10-CM | POA: Diagnosis not present

## 2023-10-12 DIAGNOSIS — Z5111 Encounter for antineoplastic chemotherapy: Secondary | ICD-10-CM | POA: Diagnosis not present

## 2023-10-12 DIAGNOSIS — C83398 Diffuse large b-cell lymphoma of other extranodal and solid organ sites: Secondary | ICD-10-CM | POA: Diagnosis not present

## 2023-10-12 DIAGNOSIS — C7801 Secondary malignant neoplasm of right lung: Secondary | ICD-10-CM | POA: Diagnosis not present

## 2023-10-12 LAB — RAD ONC ARIA SESSION SUMMARY
Course Elapsed Days: 23
Plan Fractions Treated to Date: 18
Plan Prescribed Dose Per Fraction: 2 Gy
Plan Total Fractions Prescribed: 33
Plan Total Prescribed Dose: 66 Gy
Reference Point Dosage Given to Date: 36 Gy
Reference Point Session Dosage Given: 2 Gy
Session Number: 18

## 2023-10-15 ENCOUNTER — Encounter: Payer: Self-pay | Admitting: Hematology

## 2023-10-15 ENCOUNTER — Ambulatory Visit
Admission: RE | Admit: 2023-10-15 | Discharge: 2023-10-15 | Disposition: A | Discharge status: Home / Self Care | Source: Ambulatory Visit | Attending: Radiation Oncology

## 2023-10-15 ENCOUNTER — Other Ambulatory Visit: Payer: Self-pay

## 2023-10-15 DIAGNOSIS — Z87891 Personal history of nicotine dependence: Secondary | ICD-10-CM | POA: Diagnosis not present

## 2023-10-15 DIAGNOSIS — C83398 Diffuse large b-cell lymphoma of other extranodal and solid organ sites: Secondary | ICD-10-CM | POA: Diagnosis not present

## 2023-10-15 DIAGNOSIS — Z5111 Encounter for antineoplastic chemotherapy: Secondary | ICD-10-CM | POA: Diagnosis not present

## 2023-10-15 DIAGNOSIS — C7801 Secondary malignant neoplasm of right lung: Secondary | ICD-10-CM | POA: Diagnosis not present

## 2023-10-15 DIAGNOSIS — C3411 Malignant neoplasm of upper lobe, right bronchus or lung: Secondary | ICD-10-CM | POA: Diagnosis not present

## 2023-10-15 DIAGNOSIS — Z510 Encounter for antineoplastic radiation therapy: Secondary | ICD-10-CM | POA: Diagnosis not present

## 2023-10-15 LAB — RAD ONC ARIA SESSION SUMMARY
Course Elapsed Days: 26
Plan Fractions Treated to Date: 19
Plan Prescribed Dose Per Fraction: 2 Gy
Plan Total Fractions Prescribed: 33
Plan Total Prescribed Dose: 66 Gy
Reference Point Dosage Given to Date: 38 Gy
Reference Point Session Dosage Given: 2 Gy
Session Number: 19

## 2023-10-16 ENCOUNTER — Ambulatory Visit
Admission: RE | Admit: 2023-10-16 | Discharge: 2023-10-16 | Disposition: A | Discharge status: Home / Self Care | Source: Ambulatory Visit | Attending: Radiation Oncology

## 2023-10-16 ENCOUNTER — Inpatient Hospital Stay

## 2023-10-16 ENCOUNTER — Other Ambulatory Visit: Payer: Self-pay

## 2023-10-16 ENCOUNTER — Inpatient Hospital Stay (HOSPITAL_BASED_OUTPATIENT_CLINIC_OR_DEPARTMENT_OTHER): Admitting: Hematology

## 2023-10-16 ENCOUNTER — Ambulatory Visit
Admission: RE | Admit: 2023-10-16 | Discharge: 2023-10-16 | Disposition: A | Discharge status: Home / Self Care | Source: Ambulatory Visit | Attending: Radiation Oncology | Admitting: Radiation Oncology

## 2023-10-16 VITALS — BP 107/60 | HR 92 | Temp 97.9°F | Resp 18 | Wt 152.8 lb

## 2023-10-16 DIAGNOSIS — Z87891 Personal history of nicotine dependence: Secondary | ICD-10-CM | POA: Diagnosis not present

## 2023-10-16 DIAGNOSIS — Z510 Encounter for antineoplastic radiation therapy: Secondary | ICD-10-CM | POA: Diagnosis not present

## 2023-10-16 DIAGNOSIS — C7801 Secondary malignant neoplasm of right lung: Secondary | ICD-10-CM | POA: Diagnosis not present

## 2023-10-16 DIAGNOSIS — Z7189 Other specified counseling: Secondary | ICD-10-CM

## 2023-10-16 DIAGNOSIS — Z95828 Presence of other vascular implants and grafts: Secondary | ICD-10-CM

## 2023-10-16 DIAGNOSIS — C3491 Malignant neoplasm of unspecified part of right bronchus or lung: Secondary | ICD-10-CM

## 2023-10-16 DIAGNOSIS — C3411 Malignant neoplasm of upper lobe, right bronchus or lung: Secondary | ICD-10-CM | POA: Diagnosis not present

## 2023-10-16 DIAGNOSIS — C83398 Diffuse large b-cell lymphoma of other extranodal and solid organ sites: Secondary | ICD-10-CM | POA: Diagnosis not present

## 2023-10-16 DIAGNOSIS — Z5111 Encounter for antineoplastic chemotherapy: Secondary | ICD-10-CM | POA: Diagnosis not present

## 2023-10-16 LAB — CBC WITH DIFFERENTIAL (CANCER CENTER ONLY)
Abs Immature Granulocytes: 0.01 10*3/uL (ref 0.00–0.07)
Basophils Absolute: 0 10*3/uL (ref 0.0–0.1)
Basophils Relative: 0 %
Eosinophils Absolute: 0 10*3/uL (ref 0.0–0.5)
Eosinophils Relative: 0 %
HCT: 37.4 % (ref 36.0–46.0)
Hemoglobin: 13 g/dL (ref 12.0–15.0)
Immature Granulocytes: 0 %
Lymphocytes Relative: 9 %
Lymphs Abs: 0.2 10*3/uL — ABNORMAL LOW (ref 0.7–4.0)
MCH: 31.3 pg (ref 26.0–34.0)
MCHC: 34.8 g/dL (ref 30.0–36.0)
MCV: 89.9 fL (ref 80.0–100.0)
Monocytes Absolute: 0.3 10*3/uL (ref 0.1–1.0)
Monocytes Relative: 10 %
Neutro Abs: 2.2 10*3/uL (ref 1.7–7.7)
Neutrophils Relative %: 81 %
Platelet Count: 102 10*3/uL — ABNORMAL LOW (ref 150–400)
RBC: 4.16 MIL/uL (ref 3.87–5.11)
RDW: 14.6 % (ref 11.5–15.5)
WBC Count: 2.7 10*3/uL — ABNORMAL LOW (ref 4.0–10.5)
nRBC: 0 % (ref 0.0–0.2)

## 2023-10-16 LAB — CMP (CANCER CENTER ONLY)
ALT: 18 U/L (ref 0–44)
AST: 19 U/L (ref 15–41)
Albumin: 4.2 g/dL (ref 3.5–5.0)
Alkaline Phosphatase: 62 U/L (ref 38–126)
Anion gap: 7 (ref 5–15)
BUN: 16 mg/dL (ref 8–23)
CO2: 27 mmol/L (ref 22–32)
Calcium: 9.6 mg/dL (ref 8.9–10.3)
Chloride: 104 mmol/L (ref 98–111)
Creatinine: 0.76 mg/dL (ref 0.44–1.00)
GFR, Estimated: >60 mL/min (ref >60)
Glucose, Bld: 131 mg/dL — ABNORMAL HIGH (ref 70–99)
Potassium: 3.8 mmol/L (ref 3.5–5.1)
Sodium: 138 mmol/L (ref 135–145)
Total Bilirubin: 0.6 mg/dL (ref 0.0–1.2)
Total Protein: 6.7 g/dL (ref 6.5–8.1)

## 2023-10-16 LAB — RAD ONC ARIA SESSION SUMMARY
Course Elapsed Days: 27
Plan Fractions Treated to Date: 20
Plan Prescribed Dose Per Fraction: 2 Gy
Plan Total Fractions Prescribed: 33
Plan Total Prescribed Dose: 66 Gy
Reference Point Dosage Given to Date: 40 Gy
Reference Point Session Dosage Given: 2 Gy
Session Number: 20

## 2023-10-16 MED ORDER — SODIUM CHLORIDE 0.9 % IV SOLN
154.4000 mg | Freq: Once | INTRAVENOUS | Status: AC
  Administered 2023-10-16: 150 mg via INTRAVENOUS
  Filled 2023-10-16: qty 15

## 2023-10-16 MED ORDER — MAGIC MOUTHWASH W/LIDOCAINE: 5.0000 mL | Freq: Four times a day (QID) | ORAL | 0 refills | Status: DC | PRN

## 2023-10-16 MED ORDER — SUCRALFATE 1 G PO TABS: 1.0000 g | ORAL_TABLET | Freq: Three times a day (TID) | ORAL | 0 refills | Status: DC

## 2023-10-16 MED ORDER — DIPHENHYDRAMINE HCL 50 MG/ML IJ SOLN
50.0000 mg | Freq: Once | INTRAMUSCULAR | Status: AC
  Administered 2023-10-16: 50 mg via INTRAVENOUS
  Filled 2023-10-16: qty 1

## 2023-10-16 MED ORDER — HEPARIN SOD (PORK) LOCK FLUSH 100 UNIT/ML IV SOLN
500.0000 [IU] | Freq: Once | INTRAVENOUS | Status: AC | PRN
  Administered 2023-10-16: 500 [IU]

## 2023-10-16 MED ORDER — LIDOCAINE VISCOUS HCL 2 % MT SOLN: 15.0000 mL | OROMUCOSAL | 0 refills | Status: DC | PRN

## 2023-10-16 MED ORDER — NYSTATIN 100000 UNIT/ML MT SUSP: 5.0000 mL | Freq: Four times a day (QID) | OROMUCOSAL | 0 refills | Status: DC

## 2023-10-16 MED ORDER — FAMOTIDINE IN NACL 20-0.9 MG/50ML-% IV SOLN
20.0000 mg | Freq: Once | INTRAVENOUS | Status: AC
  Administered 2023-10-16: 20 mg via INTRAVENOUS
  Filled 2023-10-16: qty 50

## 2023-10-16 MED ORDER — SODIUM CHLORIDE 0.9% FLUSH
10.0000 mL | Freq: Once | INTRAVENOUS | Status: AC
  Administered 2023-10-16: 10 mL

## 2023-10-16 MED ORDER — FLUCONAZOLE 100 MG PO TABS: 100.0000 mg | ORAL_TABLET | Freq: Every day | ORAL | 0 refills | Status: AC

## 2023-10-16 MED ORDER — SODIUM CHLORIDE 0.9% FLUSH
10.0000 mL | INTRAVENOUS | Status: DC | PRN
Start: 2023-10-16 — End: 2023-10-16
  Administered 2023-10-16: 10 mL

## 2023-10-16 MED ORDER — SODIUM CHLORIDE 0.9 % IV SOLN: INTRAVENOUS | Status: DC

## 2023-10-16 MED ORDER — PALONOSETRON HCL INJECTION 0.25 MG/5ML
0.2500 mg | Freq: Once | INTRAVENOUS | Status: AC
  Administered 2023-10-16: 0.25 mg via INTRAVENOUS
  Filled 2023-10-16: qty 5

## 2023-10-16 MED ORDER — DEXAMETHASONE SODIUM PHOSPHATE 10 MG/ML IJ SOLN
10.0000 mg | Freq: Once | INTRAMUSCULAR | Status: AC
  Administered 2023-10-16: 10 mg via INTRAVENOUS
  Filled 2023-10-16: qty 1

## 2023-10-16 MED ORDER — SODIUM CHLORIDE 0.9 % IV SOLN
45.0000 mg/m2 | Freq: Once | INTRAVENOUS | Status: AC
  Administered 2023-10-16: 84 mg via INTRAVENOUS
  Filled 2023-10-16: qty 14

## 2023-10-16 NOTE — Patient Instructions (Signed)
 [facility]  Discharge Instructions: Thank you for choosing [facility] to provide your oncology and hematology care.   If you have a lab appointment with the Cancer Center, please go directly to the Cancer Center and check in at the registration area.   Wear comfortable clothing and clothing appropriate for easy access to any Portacath or PICC line.   We strive to give you quality time with your provider. You may need to reschedule your appointment if you arrive late (15 or more minutes).  Arriving late affects you and other patients whose appointments are after yours.  Also, if you miss three or more appointments without notifying the office, you may be dismissed from the clinic at the provider's discretion.      For prescription refill requests, have your pharmacy contact our office and allow 72 hours for refills to be completed.    Today you received the following chemotherapy and/or immunotherapy agents:   Paclitaxel, Carboplatin    To help prevent nausea and vomiting after your treatment, we encourage you to take your nausea medication as directed.  BELOW ARE SYMPTOMS THAT SHOULD BE REPORTED IMMEDIATELY: *FEVER GREATER THAN 100.4 F (38 C) OR HIGHER *CHILLS OR SWEATING *NAUSEA AND VOMITING THAT IS NOT CONTROLLED WITH YOUR NAUSEA MEDICATION *UNUSUAL SHORTNESS OF BREATH *UNUSUAL BRUISING OR BLEEDING *URINARY PROBLEMS (pain or burning when urinating, or frequent urination) *BOWEL PROBLEMS (unusual diarrhea, constipation, pain near the anus) TENDERNESS IN MOUTH AND THROAT WITH OR WITHOUT PRESENCE OF ULCERS (sore throat, sores in mouth, or a toothache) UNUSUAL RASH, SWELLING OR PAIN  UNUSUAL VAGINAL DISCHARGE OR ITCHING   Items with * indicate a potential emergency and should be followed up as soon as possible or go to the Emergency Department if any problems should occur.  Please show the CHEMOTHERAPY ALERT CARD or  IMMUNOTHERAPY ALERT CARD at check-in to the Emergency Department and triage nurse.  Should you have questions after your visit or need to cancel or reschedule your appointment, please contact [facility]  Dept: [phone]  and follow the prompts.  Office hours are 8:00 a.m. to 4:30 p.m. Monday - Friday. Please note that voicemails left after 4:00 p.m. may not be returned until the following business day.  We are closed weekends and major holidays. You have access to a nurse at all times for urgent questions. Please call the main number to the clinic Dept: [phone] and follow the prompts.   For any non-urgent questions, you may also contact your provider using MyChart. We now offer e-Visits for anyone 63 and older to request care online for non-urgent symptoms. For details visit [email or web address].   Also download the MyChart app! Go to the app store, search "MyChart", open the app, select [place], and log in with your MyChart username and password.

## 2023-10-16 NOTE — Progress Notes (Signed)
 HEMATOLOGY/ONCOLOGY CLINIC NOTE  Date of Service: 10/16/23  Patient Care Team: Clydene Darner, PA-C as PCP - General (Internal Medicine) Tammie Fall, MD as PCP - Cardiology (Cardiology) Jeannette Mills, MD (Family Medicine) Frankie Israel, MD as Consulting Physician (Hematology)  CHIEF COMPLAINTS/PURPOSE OF CONSULTATION:  Management of recently diagnosed non small cel lung cancer Hx of DLBCL   HISTORY OF PRESENTING ILLNESS:   Rachel Bates is a wonderful 78 y.o. female who has been referred to us  by Sharren Decree, DO for evaluation and management of non hodgkin's lymphoma. She has a hx of chronic systolic heart failure.  Nasolaryngoscopy revealed a mass lesion of the left oropharynx, emanating from the left base of tongue/glossotonsillar sulcus. Patient subsequently had a CT neck with contrast performed on 08/09/2022, which reported extensive bilateral adenopaty, with greatest lymph node conglomerate measuring 3.2 x 2.4 cm in the right level 2. Patient underwent core needle biopsy on 08/18/22 and endorsed persistent throat pain and odynophagia at the time. She is a former smoker and quit in the late 90s. SHe endorsed heavy tobacco use prior to quitting, with 1.5 pack/day for about 30 years.   Today, she is accompanied by her daughter. She complains of a sore throat beginning around Thanksgiving time. She was given antibiotics and prednisone which did not improve symptoms. She endorses swelling in her neck with associated soreness which came quickly past 2-3 weeks. She reports that her neck edema has recently grown on both the left and right sides. Her neck pain is in a specific area. She also complains of recent her neck pain if she turns her neck.  She did present Emergency EMT because something was stuck in her throat. Endoscopy revealed that it was food and it was pushed down. An ulcer was also found in her esophagus and some narrowing which will be stretched in  three months. She has not had any issues with swallowing food since then. She has not had a biopsy of issue on the back of her tongue. No other lumps/bumps, unexplained fevers, chills, night sweat, change in breathing, abdominal pain, or skin rashes. She reports discomfort with drinking tea which caused her to eat less as well.   She is allergic to Sumatriptan and another medication she cannot recall. She does not consume alcohol and has quit smoking in the 90s. 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 She believes the sore was caused by shoe discomfort.  She denies any facial puffiness, abdominal pain, recent change in bowel habits or urination. She reports that her defibrillator has never gone off.   She reports a ganglion cyst on her right knee, 2 knee replacements, and 2 hip replacements. She denies a Fhx of blood disorders. However, her sister has lung cancer and was a frequent smoker.  She is UTD with her vaccinations, incuding influenza, RSV, and COVID-19 booster. She has endorsed migraines since childhood. She lives on her own is able to complete daily activities independently. She will follow-up with Dr. Westley Hammers soon. She follows up with cardiologist regularly. Her a fib, pacemaker, and ICD device have been stable. She has not had a recent echo.   INTERVAL HISTORY:  Rachel Bates is a 78 y.o. female who presents today for follow up and management of squamous cell carcinoma of the lung.  She is here for cycle 5 day 1 of her CARBOplatin plus  PACLitaxel treatment.   Patient was last seen by me on 10/09/2023 and she complained of mild sore throat and occasional nausea.   Patient is accompanied by her friend during this visit. Patient notes she has been doing fairly well since our last visit. She complains of throat pain/swallowing problem when swallowing cold food, cold water, and room-temperature  water. She describes the pain as "burning sensation." She denies pain when swallowing hot/warm food or water. She has lost around 5 lbs due to swallowing problem.   She denies any new infection issues, fever, chills, night sweats, unexpected weight loss, back pain, chest pain, abdominal pain, or leg swelling.   Patient tolerated her cycle 4 of her treatment well without any new or severe toxicities. She denies diarrhea, nausea, or other symptoms.     MEDICAL HISTORY:  Past Medical History:  Diagnosis Date   AICD (automatic cardioverter/defibrillator) present dual   [employer] ---  original placedment 2007/  generator change 2014 by dr Pete Brand taylor   Anemia    Anticoagulant long-term use    eliquis   Anxiety    Arthralgia of multiple joints    Arthritis pain    Benign hypertensive heart disease    CAD (coronary artery disease) primary cardiologist-- dr Pete Brand taylor   MI and 2 stents 1995 in [place]    Cancer [facility])    CHF NYHA class II, chronic, systolic (HCC)    followed by dr Pete Brand taylor   Degenerative disc disease, lumbar    Depression    Dyslipidemia    History of basal cell carcinoma (BCC) excision    right ankle area s/p  excision in office 06/ 2019  in office   History of DVT of lower extremity yrs ago before 2012   History of MI (myocardial infarction) 1995  in [place]   History of pulmonary embolus (PE) 2012   History of ventricular tachycardia    Hyperlipidemia    Hypersomnia    Hypertension    Ischemic cardiomyopathy    Migraines    Myocardial infarction (HCC)    OA (osteoarthritis)    "all over"   PAF (paroxysmal atrial fibrillation) (HCC)    Pneumonia    02/2023   Primary localized osteoarthritis of right hip 12/18/2017   Primary localized osteoarthritis of right knee 05/28/2018   S/P coronary artery stent placement 1995   in [facility]   05-21-2018 per pt x2  stents in same coronary artery (unsure BM or DES)   Vitamin D deficiency disease     SURGICAL  HISTORY: Past Surgical History:  Procedure Laterality Date   BIOPSY  06/19/2022   Procedure: BIOPSY;  Surgeon: Felecia Hopper, MD;  Location: [facility] ENDOSCOPY;  Service: Gastroenterology;;   BRONCHIAL BIOPSY  08/07/2023   Procedure: BRONCHIAL BIOPSIES;  Surgeon: Denson Flake, MD;  Location[facility] ENDOSCOPY;  Service: Pulmonary;;   BRONCHIAL BRUSHINGS  08/07/2023   Procedure: BRONCHIAL BRUSHINGS;  Surgeon: Denson Flake, MD;  Location[facility] ENDOSCOPY;  Service: Pulmonary;;   BRONCHIAL NEEDLE ASPIRATION BIOPSY  08/07/2023   Procedure: BRONCHIAL NEEDLE ASPIRATION BIOPSIES;  Surgeon: Denson Flake, MD;  Location: [facility] ENDOSCOPY;  Service: Pulmonary;;   CARDIAC DEFIBRILLATOR PLACEMENT  2007   CATARACT EXTRACTION, BILATERAL     COLONOSCOPY  04/14/2013   colonic polyp, status post polypectomy. Mild panocolonic diverticulosis. Small internal hemorrhoids   DILATION AND CURETTAGE OF UTERUS  yrs ago   DIRECT LARYNGOSCOPY Right 10/04/2022   Procedure: DIRECT LARYNGOSCOPY;  Surgeon: Daleen Dubs,  DO;  Location: [facility] OR;  Service: ENT;  Laterality: Right;   ESOPHAGOGASTRODUODENOSCOPY (EGD) WITH PROPOFOL N/A 06/19/2022   Procedure: ESOPHAGOGASTRODUODENOSCOPY (EGD) WITH PROPOFOL;  Surgeon: Felecia Hopper, MD;  Location: [facility] ENDOSCOPY;  Service: Gastroenterology;  Laterality: N/A;   FIDUCIAL MARKER PLACEMENT  08/07/2023   Procedure: FIDUCIAL MARKER PLACEMENT;  Surgeon: Denson Flake, MD;  Location: [facility] ENDOSCOPY;  Service: Pulmonary;;   IMPACTION REMOVAL  06/19/2022   Procedure: IMPACTION REMOVAL;  Surgeon: Felecia Hopper, MD;  Location: [facility] ENDOSCOPY;  Service: Gastroenterology;;   IMPLANTABLE CARDIOVERTER DEFIBRILLATOR GENERATOR CHANGE N/A 02/04/2013   Procedure: IMPLANTABLE CARDIOVERTER DEFIBRILLATOR GENERATOR CHANGE;  Surgeon: Tammie Fall, MD;  Location: [facility] CATH LAB;  Service: Cardiovascular;  Laterality: N/A;   IR IMAGING GUIDED PORT INSERTION  10/27/2022   LYMPH NODE BIOPSY Right 10/04/2022    Procedure: EXCISIONAL OF RIGHT DEEP CERVICAL LYMPH NODE;  Surgeon: Daleen Dubs, DO;  Location: [facility] OR;  Service: ENT;  Laterality: Right;   SHOULDER ARTHROSCOPY Right 2015   TOTAL HIP ARTHROPLASTY Right 12/18/2017   Procedure: RIGHT TOTAL HIP ARTHROPLASTY;  Surgeon: Osa Blase, MD;  Location: [facility] OR;  Service: Orthopedics;  Laterality: Right;   TOTAL HIP ARTHROPLASTY Left 04/19/2021   Procedure: TOTAL HIP ARTHROPLASTY;  Surgeon: Osa Blase, MD;  Location: [facility] ORS;  Service: Orthopedics;  Laterality: Left;   TOTAL KNEE ARTHROPLASTY Right 05/28/2018   Procedure: TOTAL KNEE ARTHROPLASTY;  Surgeon: Osa Blase, MD;  Location: [facility] ORS;  Service: Orthopedics;  Laterality: Right;  Adductor Block   TOTAL KNEE ARTHROPLASTY Left 08/12/2021   Procedure: TOTAL KNEE ARTHROPLASTY;  Surgeon: Osa Blase, MD;  Location: [facility] ORS;  Service: Orthopedics;  Laterality: Left;   TUBAL LIGATION Bilateral yrs ago   VIDEO BRONCHOSCOPY WITH RADIAL ENDOBRONCHIAL ULTRASOUND  08/07/2023   Procedure: VIDEO BRONCHOSCOPY WITH RADIAL ENDOBRONCHIAL ULTRASOUND;  Surgeon: Denson Flake, MD;  Location: [facility] ENDOSCOPY;  Service: Pulmonary;;   WISDOM TOOTH EXTRACTION      SOCIAL HISTORY: Social History   Socioeconomic History   Marital status: Single    Spouse name: Not on file   Number of children: 0   Years of education: 14   Highest education level: Not on file  Occupational History   Not on file  Tobacco Use   Smoking status: Former    Current packs/day: 0.00    Types: Cigarettes    Start date: 05/21/1966    Quit date: 05/21/1996    Years since quitting: 27.4   Smokeless tobacco: Never  Vaping Use   Vaping status: Never Used  Substance and Sexual Activity   Alcohol use: No   Drug use: Never   Sexual activity: Not on file  Other Topics Concern   Not on file  Social History Narrative   Not on file   Social Drivers of Health   Financial Resource Strain: Low Risk  (05/28/2018)   Overall  Financial Resource Strain (CARDIA)    Difficulty of Paying Living Expenses: Not hard at all  Food Insecurity: No Food Insecurity (08/28/2023)   Hunger Vital Sign    Worried About Running Out of Food in the Last Year: Never true    Ran Out of Food in the Last Year: Never true  Transportation Needs: No Transportation Needs (08/28/2023)   PRAPARE - Administrator, Civil Service (Medical): No    Lack of Transportation (Non-Medical): No  Physical Activity: Not on file  Stress: Not on file  Social Connections: Not on file  Intimate Partner Violence: Not At Risk (08/28/2023)   Humiliation, Afraid, Rape, and Kick questionnaire    Fear of Current or Ex-Partner: No    Emotionally Abused: No    Physically Abused: No    Sexually Abused: No    FAMILY HISTORY: Family History  Problem Relation Age of Onset   Hypertension Mother    Thyroid disease Mother    Alzheimer's disease Mother    Coronary artery disease Father    Pulmonary embolism Father    Congestive Heart Failure Maternal Grandmother    Hypertension Maternal Grandmother    Heart attack Maternal Grandfather    Other Maternal Grandfather        carotid disease   Dementia Paternal Grandmother    Other Paternal Grandfather 24       accident    ALLERGIES:  is allergic to sumatriptan succinate and amitriptyline.  MEDICATIONS:  Current Outpatient Medications  Medication Sig Dispense Refill   acetaminophen (TYLENOL) 650 MG CR tablet Take 1,300 mg by mouth every 8 (eight) hours as needed for pain.     apixaban (ELIQUIS) 5 MG TABS tablet Take 1 tablet (5 mg total) by mouth 2 (two) times daily. Okay to restart this medication on 08/08/2023     b complex vitamins capsule Take 1 capsule by mouth daily.     Calcium Carb-Cholecalciferol (CALCIUM 600 + D PO) Take 1 tablet by mouth daily.     carvedilol (COREG) 12.5 MG tablet Take 12.5 mg by mouth 2 (two) times daily with a meal.      Cholecalciferol (VITAMIN D3) 125 MCG (5000 UT)  capsule Take 5,000 Units by mouth daily.     cyanocobalamin (VITAMIN B12) 1000 MCG tablet Take 1,000 mcg by mouth daily.     dexamethasone (DECADRON) 4 MG tablet Take 2 tablets daily for 2 days, start the day after chemotherapy. Take with food. 30 tablet 1   FIBER PO Take 1 capsule by mouth 2 (two) times daily.     Fiber POWD Take 1 Scoop by mouth daily.     gabapentin (NEURONTIN) 300 MG capsule Take 300 mg by mouth 3 (three) times daily.     lidocaine-prilocaine (EMLA) cream Apply to affected area once 30 g 3   loratadine (CLARITIN) 10 MG tablet Take 10 mg by mouth daily.     Omega-3 Fatty Acids (FISH OIL) 1000 MG CAPS Take 1,000 mg by mouth daily.      ondansetron (ZOFRAN) 8 MG tablet Take 1 tablet (8 mg total) by mouth every 8 (eight) hours as needed for nausea or vomiting. Start on the third day after chemotherapy. 30 tablet 1   prochlorperazine (COMPAZINE) 10 MG tablet Take 1 tablet (10 mg total) by mouth every 6 (six) hours as needed for nausea or vomiting. 30 tablet 1   ramipril (ALTACE) 2.5 MG tablet Take 2.5 mg by mouth daily.       simvastatin (ZOCOR) 40 MG tablet Take 40 mg by mouth at bedtime.       tiZANidine (ZANAFLEX) 4 MG tablet Take 2 mg by mouth daily as needed for muscle spasms.     traZODone (DESYREL) 50 MG tablet Take 50 mg by mouth at bedtime.      venlafaxine (EFFEXOR) 75 MG tablet Take 75 mg by mouth daily.     vitamin E 400 UNIT capsule Take 400 Units by mouth daily.     zonisamide (ZONEGRAN) 50 MG capsule Take 50-100 mg by mouth See admin instructions. Take  50mg  by mouth in the morning and 100mg  at night.     No current facility-administered medications for this visit.    REVIEW OF SYSTEMS:    10 Point review of Systems was done is negative except as noted above.   PHYSICAL EXAMINATION: .BP 107/60 (BP Location: Left Arm, Patient Position: Sitting)   Pulse 92   Temp 97.9 F (36.6 C) (Temporal)   Resp 18   Wt 152 lb 12.8 oz (69.3 kg)   SpO2 94%   BMI 24.12  kg/m   GENERAL:alert, in no acute distress and comfortable SKIN: no acute rashes, no significant lesions EYES: conjunctiva are pink and non-injected, sclera anicteric OROPHARYNX: MMM, no exudates, no oropharyngeal erythema or ulceration NECK: supple, no JVD LYMPH:  no palpable lymphadenopathy in the cervical, axillary or inguinal regions LUNGS: clear to auscultation b/l with normal respiratory effort HEART: regular rate & rhythm ABDOMEN:  normoactive bowel sounds , non tender, not distended. Extremity: no pedal edema PSYCH: alert & oriented x 3 with fluent speech NEURO: no focal motor/sensory deficits  LABORATORY DATA:  I have reviewed the data as listed    Latest Ref Rng & Units 10/16/2023   11:23 AM 10/09/2023    1:14 PM 10/02/2023    1:45 PM  CBC  WBC 4.0 - 10.5 K/uL 2.7  2.1  2.3   Hemoglobin 12.0 - 15.0 g/dL 96.2  95.2  84.1   Hematocrit 36.0 - 46.0 % 37.4  36.4  37.1   Platelets 150 - 400 K/uL 102  97  110       Latest Ref Rng & Units 10/16/2023   11:23 AM 10/09/2023    1:14 PM 10/02/2023    1:45 PM  CMP  Glucose 70 - 99 mg/dL 324  401  96   BUN 8 - 23 mg/dL 16  12  13    Creatinine 0.44 - 1.00 mg/dL 0.27  2.53  6.64   Sodium 135 - 145 mmol/L 138  141  139   Potassium 3.5 - 5.1 mmol/L 3.8  4.0  4.1   Chloride 98 - 111 mmol/L 104  107  107   CO2 22 - 32 mmol/L 27  29  29    Calcium 8.9 - 10.3 mg/dL 9.6  9.2  9.2   Total Protein 6.5 - 8.1 g/dL 6.7  6.3  6.1   Total Bilirubin 0.0 - 1.2 mg/dL 0.6  0.4  0.4   Alkaline Phos 38 - 126 U/L 62  59  63   AST 15 - 41 U/L 19  16  19    ALT 0 - 44 U/L 18  16  18     Lab Results  Component Value Date   LDH 180 08/03/2023   Molecular Pathology 10/21/2022:    Surgical Pathology 10/04/22: A. LYMPH NODE, RIGHT LEVEL 2 DEEP CERVICAL, EXCISION:  -Diffuse large B-cell lymphoma  -See comment COMMENT: The sections show diffuse effacement of the lymph nodal architecture primarily by a population of large lymphoid cells characterized  by vesicular chromatin and small nucleoli associated with apoptosis and brisk mitosis.  In some areas, the large atypical lymphoid proliferation extends into the perinodal adipose tissue.  In this background, there are scattered variably sized and somewhat disrupted aggregates of primarily small lymphoid cells characterized by high nuclear cytoplasmic ratio, angulated nuclear contours and small to inconspicuous nucleoli. Flow cytometric analysis was performed [facility] [id number]) and shows a monoclonal, lambda restricted B-cell population expressing CD10.  In addition, immunohistochemical stains for  CD3, CD5, CD10, CD20, PAX5, BCL6, Bcl-2, Ki-67, CD30, CD138, CD21, EBV in addition to in situ hybridization for kappa and lambda were performed with appropriate controls.  The large lymphoid cells are positive for CD20, PAX5, CD10, BCL6, Bcl-2, and partially for cytoplasmic lambda.  No significant staining is seen with EBV, CD30, CD138 or cytoplasmic kappa.  Ki67 shows variably increased expression (more than 50% in some areas). CD21 highlights scattered somewhat disrupted follicular dendritic networks. The lymphoid aggregates of primarily small lymphoid cells previously described show positivity for B-cell markers CD20 and PAX5 in addition to CD10 and Bcl-2.  There is an admixed variable T-cell component in the background as seen with CD3 and CD5 and there is no apparent co-expression of CD5 in B-cell areas.  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    RADIOGRAPHIC STUDIES: I have personally reviewed the radiological images as listed and agreed with the findings in the report. No results found.   NM PET 09/04/2022: IMPRESSION: 1. There is a large tracer avid mass centered within the left base of tongue and left lingual region which extends into  the hypopharyngeal region ventral to the epiglottis. Imaging findings are compatible with a primary head and neck malignancy. 2. A smaller focus of increased uptake localizes to the right pharyngeal tonsil region. Indeterminate. 3. Bilateral tracer avid cervical and supraclavicular lymph nodes compatible with nodal metastasis. 4. There is a 7 mm tracer avid nodule within the anteromedial left upper lobe which is suspicious for pulmonary metastasis. 5. There is increased uptake identified along the long axis of the posterior right seventh rib. No corresponding lytic or sclerotic changes identified on the CT images. Cannot exclude bone metastases. 6. Multiple prominent retroperitoneal and mesenteric lymph nodes are identified which exhibit mild tracer uptake. This is a nonspecific finding and may reflect reactive adenopathy. Metastatic adenopathy cannot be excluded. Attention on future surveillance imaging is advised. 7.  Aortic Atherosclerosis (ICD10-I70.0).   ASSESSMENT & PLAN:   78 y.o.  female with:   1. 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 2. 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 CBC shows low bu improved WBC of 2.7 K and low platelets of 102 K. Neutrophils improved from 1.6 to 2.2 . CMP  stab;e  -Discussed with the patient that the throat pain/swallowing problem is a common side effect of radiation treatment. Discussed we will prescribe magic mouth was as needed and sucralfate to help with the throat pain.  -Discussed with the patient that the next  treatment plan after completing radiation treatment will be immunotherapy, but we will discuss the treatment option after she completes her radiation treatment.  -Patient tolerated cycle 4 of her treatment well without any new or severe toxicities.  -Patient can proceed with cycle 5 of her treatment today since pt is not neutropenic.  -Continue radiation treatment. Last radiation treatment on 11/02/2023.   -Answered all the questions.   FOLLOW-UP: Plz add f/u with Delores Fester with next treatment in 1 week RTC with Dr Salomon Cree with portflush  and labs in 2-3 weeks   The total time spent in the appointment was 30 minutes* .  All of the patient's questions were answered with apparent satisfaction. The patient knows to call the clinic with any problems, questions or concerns.   Jacquelyn Matt MD MS AAHIVMS [facility] Hematology/Oncology Physician [facility]  .*Total Encounter Time as defined by the Centers for Medicare and Medicaid Services includes, in addition to the face-to-face time of a patient visit (documented in the note above) non-face-to-face time: obtaining and reviewing outside history, ordering and reviewing medications, tests or procedures, care coordination (communications with other health care professionals or caregivers) and documentation in the medical record.   I,Param Shah,acting as a Neurosurgeon for Jacquelyn Matt, MD.,have documented all relevant documentation on the behalf of Jacquelyn Matt, MD,as directed by  Jacquelyn Matt, MD while in the presence of Jacquelyn Matt, MD.  .I have reviewed the above documentation for accuracy and completeness, and I agree with the above. .Isak Sotomayor Kishore Laymond Postle MD

## 2023-10-16 NOTE — Progress Notes (Signed)
Patient seen by Dr. Addison Naegeli are within treatment parameters.  Labs reviewed: and are within treatment parameters./ CMP pending  Per physician team, patient is ready for treatment and there are NO modifications to the treatment plan.

## 2023-10-17 ENCOUNTER — Other Ambulatory Visit: Payer: Self-pay

## 2023-10-17 ENCOUNTER — Ambulatory Visit
Admission: RE | Admit: 2023-10-17 | Discharge: 2023-10-17 | Disposition: A | Discharge status: Home / Self Care | Source: Ambulatory Visit | Attending: Radiation Oncology

## 2023-10-17 DIAGNOSIS — Z87891 Personal history of nicotine dependence: Secondary | ICD-10-CM | POA: Diagnosis not present

## 2023-10-17 DIAGNOSIS — Z5111 Encounter for antineoplastic chemotherapy: Secondary | ICD-10-CM | POA: Diagnosis not present

## 2023-10-17 DIAGNOSIS — Z510 Encounter for antineoplastic radiation therapy: Secondary | ICD-10-CM | POA: Diagnosis not present

## 2023-10-17 DIAGNOSIS — C83398 Diffuse large b-cell lymphoma of other extranodal and solid organ sites: Secondary | ICD-10-CM | POA: Diagnosis not present

## 2023-10-17 DIAGNOSIS — C7801 Secondary malignant neoplasm of right lung: Secondary | ICD-10-CM | POA: Diagnosis not present

## 2023-10-17 DIAGNOSIS — C3411 Malignant neoplasm of upper lobe, right bronchus or lung: Secondary | ICD-10-CM | POA: Diagnosis not present

## 2023-10-17 LAB — RAD ONC ARIA SESSION SUMMARY
Course Elapsed Days: 28
Plan Fractions Treated to Date: 21
Plan Prescribed Dose Per Fraction: 2 Gy
Plan Total Fractions Prescribed: 33
Plan Total Prescribed Dose: 66 Gy
Reference Point Dosage Given to Date: 42 Gy
Reference Point Session Dosage Given: 2 Gy
Session Number: 21

## 2023-10-18 ENCOUNTER — Telehealth: Payer: Self-pay | Admitting: Hematology

## 2023-10-18 ENCOUNTER — Other Ambulatory Visit: Payer: Self-pay

## 2023-10-18 ENCOUNTER — Ambulatory Visit

## 2023-10-18 DIAGNOSIS — Z510 Encounter for antineoplastic radiation therapy: Secondary | ICD-10-CM | POA: Diagnosis not present

## 2023-10-18 DIAGNOSIS — C83398 Diffuse large b-cell lymphoma of other extranodal and solid organ sites: Secondary | ICD-10-CM | POA: Diagnosis not present

## 2023-10-18 DIAGNOSIS — C7801 Secondary malignant neoplasm of right lung: Secondary | ICD-10-CM | POA: Diagnosis not present

## 2023-10-18 LAB — RAD ONC ARIA SESSION SUMMARY
Course Elapsed Days: 29
Plan Fractions Treated to Date: 22
Plan Prescribed Dose Per Fraction: 2 Gy
Plan Total Fractions Prescribed: 33
Plan Total Prescribed Dose: 66 Gy
Reference Point Dosage Given to Date: 44 Gy
Reference Point Session Dosage Given: 2 Gy
Session Number: 22

## 2023-10-18 NOTE — Telephone Encounter (Signed)
 Spoke with patient confirming upcoming appointment changes

## 2023-10-19 ENCOUNTER — Other Ambulatory Visit: Payer: Self-pay | Admitting: Internal Medicine

## 2023-10-19 ENCOUNTER — Other Ambulatory Visit: Payer: Self-pay

## 2023-10-19 ENCOUNTER — Ambulatory Visit
Admission: RE | Admit: 2023-10-19 | Discharge: 2023-10-19 | Disposition: A | Discharge status: Home / Self Care | Source: Ambulatory Visit | Attending: Radiation Oncology | Admitting: Radiation Oncology

## 2023-10-19 DIAGNOSIS — Z87891 Personal history of nicotine dependence: Secondary | ICD-10-CM | POA: Diagnosis not present

## 2023-10-19 DIAGNOSIS — Z510 Encounter for antineoplastic radiation therapy: Secondary | ICD-10-CM | POA: Diagnosis not present

## 2023-10-19 DIAGNOSIS — C83398 Diffuse large b-cell lymphoma of other extranodal and solid organ sites: Secondary | ICD-10-CM | POA: Diagnosis not present

## 2023-10-19 DIAGNOSIS — I480 Paroxysmal atrial fibrillation: Secondary | ICD-10-CM

## 2023-10-19 DIAGNOSIS — C7801 Secondary malignant neoplasm of right lung: Secondary | ICD-10-CM | POA: Diagnosis not present

## 2023-10-19 DIAGNOSIS — Z5111 Encounter for antineoplastic chemotherapy: Secondary | ICD-10-CM | POA: Diagnosis not present

## 2023-10-19 DIAGNOSIS — C3411 Malignant neoplasm of upper lobe, right bronchus or lung: Secondary | ICD-10-CM | POA: Diagnosis not present

## 2023-10-19 LAB — RAD ONC ARIA SESSION SUMMARY
Course Elapsed Days: 30
Plan Fractions Treated to Date: 23
Plan Prescribed Dose Per Fraction: 2 Gy
Plan Total Fractions Prescribed: 33
Plan Total Prescribed Dose: 66 Gy
Reference Point Dosage Given to Date: 46 Gy
Reference Point Session Dosage Given: 2 Gy
Session Number: 23

## 2023-10-19 NOTE — Telephone Encounter (Signed)
 Prescription refill request for Eliquis received. Indication:pe Last office visit:1/25 Scr:0.76  4/25 Age: 78 Weight:69.3  kg  Prescription refilled

## 2023-10-22 ENCOUNTER — Ambulatory Visit
Admission: RE | Admit: 2023-10-22 | Discharge: 2023-10-22 | Disposition: A | Discharge status: Home / Self Care | Source: Ambulatory Visit | Attending: Radiation Oncology | Admitting: Radiation Oncology

## 2023-10-22 ENCOUNTER — Encounter: Payer: Self-pay | Admitting: Hematology

## 2023-10-22 ENCOUNTER — Other Ambulatory Visit: Payer: Self-pay

## 2023-10-22 DIAGNOSIS — Z5111 Encounter for antineoplastic chemotherapy: Secondary | ICD-10-CM | POA: Diagnosis not present

## 2023-10-22 DIAGNOSIS — C3411 Malignant neoplasm of upper lobe, right bronchus or lung: Secondary | ICD-10-CM | POA: Diagnosis not present

## 2023-10-22 DIAGNOSIS — C83398 Diffuse large b-cell lymphoma of other extranodal and solid organ sites: Secondary | ICD-10-CM | POA: Diagnosis not present

## 2023-10-22 DIAGNOSIS — Z87891 Personal history of nicotine dependence: Secondary | ICD-10-CM | POA: Diagnosis not present

## 2023-10-22 DIAGNOSIS — C7801 Secondary malignant neoplasm of right lung: Secondary | ICD-10-CM | POA: Diagnosis not present

## 2023-10-22 DIAGNOSIS — Z510 Encounter for antineoplastic radiation therapy: Secondary | ICD-10-CM | POA: Diagnosis not present

## 2023-10-22 LAB — RAD ONC ARIA SESSION SUMMARY
Course Elapsed Days: 33
Plan Fractions Treated to Date: 24
Plan Prescribed Dose Per Fraction: 2 Gy
Plan Total Fractions Prescribed: 33
Plan Total Prescribed Dose: 66 Gy
Reference Point Dosage Given to Date: 48 Gy
Reference Point Session Dosage Given: 2 Gy
Session Number: 24

## 2023-10-23 ENCOUNTER — Inpatient Hospital Stay

## 2023-10-23 ENCOUNTER — Ambulatory Visit
Admission: RE | Admit: 2023-10-23 | Discharge: 2023-10-23 | Disposition: A | Discharge status: Home / Self Care | Source: Ambulatory Visit | Attending: Radiation Oncology | Admitting: Radiation Oncology

## 2023-10-23 ENCOUNTER — Inpatient Hospital Stay (HOSPITAL_BASED_OUTPATIENT_CLINIC_OR_DEPARTMENT_OTHER): Admitting: Hematology and Oncology

## 2023-10-23 ENCOUNTER — Other Ambulatory Visit: Payer: Self-pay

## 2023-10-23 VITALS — BP 104/62 | HR 88 | Temp 98.0°F | Resp 15 | Wt 152.5 lb

## 2023-10-23 DIAGNOSIS — C8338 Diffuse large B-cell lymphoma, lymph nodes of multiple sites: Secondary | ICD-10-CM

## 2023-10-23 DIAGNOSIS — C7801 Secondary malignant neoplasm of right lung: Secondary | ICD-10-CM | POA: Diagnosis not present

## 2023-10-23 DIAGNOSIS — Z7189 Other specified counseling: Secondary | ICD-10-CM

## 2023-10-23 DIAGNOSIS — Z95828 Presence of other vascular implants and grafts: Secondary | ICD-10-CM

## 2023-10-23 DIAGNOSIS — Z5111 Encounter for antineoplastic chemotherapy: Secondary | ICD-10-CM | POA: Diagnosis not present

## 2023-10-23 DIAGNOSIS — C3411 Malignant neoplasm of upper lobe, right bronchus or lung: Secondary | ICD-10-CM | POA: Diagnosis not present

## 2023-10-23 DIAGNOSIS — C83398 Diffuse large b-cell lymphoma of other extranodal and solid organ sites: Secondary | ICD-10-CM | POA: Diagnosis not present

## 2023-10-23 DIAGNOSIS — C3491 Malignant neoplasm of unspecified part of right bronchus or lung: Secondary | ICD-10-CM

## 2023-10-23 DIAGNOSIS — Z510 Encounter for antineoplastic radiation therapy: Secondary | ICD-10-CM | POA: Diagnosis not present

## 2023-10-23 DIAGNOSIS — Z87891 Personal history of nicotine dependence: Secondary | ICD-10-CM | POA: Diagnosis not present

## 2023-10-23 LAB — CMP (CANCER CENTER ONLY)
ALT: 18 U/L (ref 0–44)
AST: 21 U/L (ref 15–41)
Albumin: 4.1 g/dL (ref 3.5–5.0)
Alkaline Phosphatase: 68 U/L (ref 38–126)
Anion gap: 4 — ABNORMAL LOW (ref 5–15)
BUN: 13 mg/dL (ref 8–23)
CO2: 26 mmol/L (ref 22–32)
Calcium: 9.5 mg/dL (ref 8.9–10.3)
Chloride: 107 mmol/L (ref 98–111)
Creatinine: 0.68 mg/dL (ref 0.44–1.00)
GFR, Estimated: >60 mL/min (ref >60)
Glucose, Bld: 109 mg/dL — ABNORMAL HIGH (ref 70–99)
Potassium: 4.2 mmol/L (ref 3.5–5.1)
Sodium: 137 mmol/L (ref 135–145)
Total Bilirubin: 0.6 mg/dL (ref 0.0–1.2)
Total Protein: 6.6 g/dL (ref 6.5–8.1)

## 2023-10-23 LAB — CBC WITH DIFFERENTIAL (CANCER CENTER ONLY)
Abs Immature Granulocytes: 0.01 10*3/uL (ref 0.00–0.07)
Basophils Absolute: 0 10*3/uL (ref 0.0–0.1)
Basophils Relative: 1 %
Eosinophils Absolute: 0 10*3/uL (ref 0.0–0.5)
Eosinophils Relative: 1 %
HCT: 35.5 % — ABNORMAL LOW (ref 36.0–46.0)
Hemoglobin: 12.5 g/dL (ref 12.0–15.0)
Immature Granulocytes: 1 %
Lymphocytes Relative: 9 %
Lymphs Abs: 0.2 10*3/uL — ABNORMAL LOW (ref 0.7–4.0)
MCH: 31.7 pg (ref 26.0–34.0)
MCHC: 35.2 g/dL (ref 30.0–36.0)
MCV: 90.1 fL (ref 80.0–100.0)
Monocytes Absolute: 0.3 10*3/uL (ref 0.1–1.0)
Monocytes Relative: 17 %
Neutro Abs: 1.4 10*3/uL — ABNORMAL LOW (ref 1.7–7.7)
Neutrophils Relative %: 71 %
Platelet Count: 95 10*3/uL — ABNORMAL LOW (ref 150–400)
RBC: 3.94 MIL/uL (ref 3.87–5.11)
RDW: 14.9 % (ref 11.5–15.5)
WBC Count: 1.9 10*3/uL — ABNORMAL LOW (ref 4.0–10.5)
nRBC: 0 % (ref 0.0–0.2)

## 2023-10-23 LAB — RAD ONC ARIA SESSION SUMMARY
Course Elapsed Days: 34
Plan Fractions Treated to Date: 25
Plan Prescribed Dose Per Fraction: 2 Gy
Plan Total Fractions Prescribed: 33
Plan Total Prescribed Dose: 66 Gy
Reference Point Dosage Given to Date: 50 Gy
Reference Point Session Dosage Given: 2 Gy
Session Number: 25

## 2023-10-23 MED ORDER — SODIUM CHLORIDE 0.9% FLUSH
10.0000 mL | Freq: Once | INTRAVENOUS | Status: AC
  Administered 2023-10-23: 10 mL

## 2023-10-23 NOTE — Progress Notes (Signed)
 [facility] [phone]   [phone]  PROGRESS NOTE  Patient Care Team: Eunice Blase, PA-C as PCP - General (Internal Medicine) Marinus Maw, MD as PCP - Cardiology (Cardiology) Ebbie Ridge, MD (Family Medicine) Johney Maine, MD as Consulting Physician (Hematology)  Hematological/Oncological History # Squamous Cell Carcinoma of the Lung, Stage III # DLBCL in Remission   Interval History:  Rachel Bates 78 y.o. female with medical history significant for squamous cell carcinoma of the lung, stage III currently undergoing chemoradiation who presents for a follow up visit. The patient's last visit was on was on 10/16/2023. In the interim since the last visit she has continued on chemoradiation therapy.  On exam today Mrs. Schaberg is accompanied by her daughter.  She reports that she has been having difficulty swallowing.  She notes that hot liquids and foods have been okay but cold items are causing pain and a burning sensation in her esophagus.  She also reports that she feels like she has to belch but is having difficulty doing so.  She reports that she was prescribed Magic mouthwash but has not yet been able to have this filled.  She reports has been having trouble with fatigue as well as nausea but no vomiting or diarrhea.  She reports that she is taking frequent naps and sleeping more often.  She reports that her weight however has remained steady and her appetite has been okay.  She reports no infectious symptoms such as runny nose, sore throat, or cough.  Is that she is tolerating radiation therapy well but is developing some rash on her back.  She also had a headache over the weekend, mostly located in her temporal lobes bilaterally.  She otherwise denies any fevers, chills, sweats, vomiting, or diarrhea.  A full 10 point ROS is otherwise negative.  Due to her low white blood cell count and neutropenia we recommended holding  chemotherapy today and reevaluating next week for consideration of restarting her chemotherapy.  Of note her last radiation treatment will be on 11/02/2023.   MEDICAL HISTORY:  Past Medical History:  Diagnosis Date   AICD (automatic cardioverter/defibrillator) present dual   [employer] ---  original placedment 2007/  generator change 2014 by dr gregg taylor   Anemia    Anticoagulant long-term use    eliquis   Anxiety    Arthralgia of multiple joints    Arthritis pain    Benign hypertensive heart disease    CAD (coronary artery disease) primary cardiologist-- dr gregg taylor   MI and 2 stents 1995 in [place]   Cancer [facility])    CHF NYHA class II, chronic, systolic (HCC)    followed by dr Sharlot Gowda taylor   Degenerative disc disease, lumbar    Depression    Dyslipidemia    History of basal cell carcinoma (BCC) excision    right ankle area s/p  excision in office 06/ 2019  in office   History of DVT of lower extremity yrs ago before 2012   History of MI (myocardial infarction) 1995  in [place]   History of pulmonary embolus (PE) 2012   History of ventricular tachycardia    Hyperlipidemia    Hypersomnia    Hypertension    Ischemic cardiomyopathy    Migraines    Myocardial infarction (HCC)    OA (osteoarthritis)    "all over"   PAF (paroxysmal atrial fibrillation) (HCC)    Pneumonia    02/2023   Primary  localized osteoarthritis of right hip 12/18/2017   Primary localized osteoarthritis of right knee 05/28/2018   S/P coronary artery stent placement 1995   in [facility]   05-21-2018 per pt x2  stents in same coronary artery (unsure BM or DES)   Vitamin D deficiency disease     SURGICAL HISTORY: Past Surgical History:  Procedure Laterality Date   BIOPSY  06/19/2022   Procedure: BIOPSY;  Surgeon: Felecia Hopper, MD;  Location: [facility] ENDOSCOPY;  Service: Gastroenterology;;   BRONCHIAL BIOPSY  08/07/2023   Procedure: BRONCHIAL BIOPSIES;  Surgeon: Denson Flake, MD;  Location: [facility] ENDOSCOPY;   Service: Pulmonary;;   BRONCHIAL BRUSHINGS  08/07/2023   Procedure: BRONCHIAL BRUSHINGS;  Surgeon: Denson Flake, MD;  Location[facility] ENDOSCOPY;  Service: Pulmonary;;   BRONCHIAL NEEDLE ASPIRATION BIOPSY  08/07/2023   Procedure: BRONCHIAL NEEDLE ASPIRATION BIOPSIES;  Surgeon: Denson Flake, MD;  Location: [facility] ENDOSCOPY;  Service: Pulmonary;;   CARDIAC DEFIBRILLATOR PLACEMENT  2007   CATARACT EXTRACTION, BILATERAL     COLONOSCOPY  04/14/2013   colonic polyp, status post polypectomy. Mild panocolonic diverticulosis. Small internal hemorrhoids   DILATION AND CURETTAGE OF UTERUS  yrs ago   DIRECT LARYNGOSCOPY Right 10/04/2022   Procedure: DIRECT LARYNGOSCOPY;  Surgeon: Drucilla Georgis A, DO;  Location: [facility] OR;  Service: ENT;  Laterality: Right;   ESOPHAGOGASTRODUODENOSCOPY (EGD) WITH PROPOFOL N/A 06/19/2022   Procedure: ESOPHAGOGASTRODUODENOSCOPY (EGD) WITH PROPOFOL;  Surgeon: Felecia Hopper, MD;  Location: [facility] ENDOSCOPY;  Service: Gastroenterology;  Laterality: N/A;   FIDUCIAL MARKER PLACEMENT  08/07/2023   Procedure: FIDUCIAL MARKER PLACEMENT;  Surgeon: Denson Flake, MD;  Location: [facility] ENDOSCOPY;  Service: Pulmonary;;   IMPACTION REMOVAL  06/19/2022   Procedure: IMPACTION REMOVAL;  Surgeon: Felecia Hopper, MD;  Location: [facility] ENDOSCOPY;  Service: Gastroenterology;;   IMPLANTABLE CARDIOVERTER DEFIBRILLATOR GENERATOR CHANGE N/A 02/04/2013   Procedure: IMPLANTABLE CARDIOVERTER DEFIBRILLATOR GENERATOR CHANGE;  Surgeon: Tammie Fall, MD;  Location: [facility] CATH LAB;  Service: Cardiovascular;  Laterality: N/A;   IR IMAGING GUIDED PORT INSERTION  10/27/2022   LYMPH NODE BIOPSY Right 10/04/2022   Procedure: EXCISIONAL OF RIGHT DEEP CERVICAL LYMPH NODE;  Surgeon: Daleen Dubs, DO;  Location: [facility] OR;  Service: ENT;  Laterality: Right;   SHOULDER ARTHROSCOPY Right 2015   TOTAL HIP ARTHROPLASTY Right 12/18/2017   Procedure: RIGHT TOTAL HIP ARTHROPLASTY;  Surgeon: Osa Blase, MD;  Location: [facility] OR;   Service: Orthopedics;  Laterality: Right;   TOTAL HIP ARTHROPLASTY Left 04/19/2021   Procedure: TOTAL HIP ARTHROPLASTY;  Surgeon: Osa Blase, MD;  Location: [facility] ORS;  Service: Orthopedics;  Laterality: Left;   TOTAL KNEE ARTHROPLASTY Right 05/28/2018   Procedure: TOTAL KNEE ARTHROPLASTY;  Surgeon: Osa Blase, MD;  Location: [facility] ORS;  Service: Orthopedics;  Laterality: Right;  Adductor Block   TOTAL KNEE ARTHROPLASTY Left 08/12/2021   Procedure: TOTAL KNEE ARTHROPLASTY;  Surgeon: Osa Blase, MD;  Location: [facility] ORS;  Service: Orthopedics;  Laterality: Left;   TUBAL LIGATION Bilateral yrs ago   VIDEO BRONCHOSCOPY WITH RADIAL ENDOBRONCHIAL ULTRASOUND  08/07/2023   Procedure: VIDEO BRONCHOSCOPY WITH RADIAL ENDOBRONCHIAL ULTRASOUND;  Surgeon: Denson Flake, MD;  Location: [facility] ENDOSCOPY;  Service: Pulmonary;;   WISDOM TOOTH EXTRACTION      SOCIAL HISTORY: Social History   Socioeconomic History   Marital status: Single    Spouse name: Not on file   Number of children: 0   Years of education: 14   Highest education level: Not on file  Occupational History  Not on file  Tobacco Use   Smoking status: Former    Current packs/day: 0.00    Types: Cigarettes    Start date: 05/21/1966    Quit date: 05/21/1996    Years since quitting: 27.4   Smokeless tobacco: Never  Vaping Use   Vaping status: Never Used  Substance and Sexual Activity   Alcohol use: No   Drug use: Never   Sexual activity: Not on file  Other Topics Concern   Not on file  Social History Narrative   Not on file   Social Drivers of Health   Financial Resource Strain: Low Risk  (05/28/2018)   Overall Financial Resource Strain (CARDIA)    Difficulty of Paying Living Expenses: Not hard at all  Food Insecurity: No Food Insecurity (08/28/2023)   Hunger Vital Sign    Worried About Running Out of Food in the Last Year: Never true    Ran Out of Food in the Last Year: Never true  Transportation Needs: No Transportation  Needs (08/28/2023)   PRAPARE - Administrator, Civil Service (Medical): No    Lack of Transportation (Non-Medical): No  Physical Activity: Not on file  Stress: Not on file  Social Connections: Not on file  Intimate Partner Violence: Not At Risk (08/28/2023)   Humiliation, Afraid, Rape, and Kick questionnaire    Fear of Current or Ex-Partner: No    Emotionally Abused: No    Physically Abused: No    Sexually Abused: No    FAMILY HISTORY: Family History  Problem Relation Age of Onset   Hypertension Mother    Thyroid disease Mother    Alzheimer's disease Mother    Coronary artery disease Father    Pulmonary embolism Father    Congestive Heart Failure Maternal Grandmother    Hypertension Maternal Grandmother    Heart attack Maternal Grandfather    Other Maternal Grandfather        carotid disease   Dementia Paternal Grandmother    Other Paternal Grandfather 40       accident    ALLERGIES:  is allergic to sumatriptan succinate and amitriptyline.  MEDICATIONS:  Current Outpatient Medications  Medication Sig Dispense Refill   acetaminophen (TYLENOL) 650 MG CR tablet Take 1,300 mg by mouth every 8 (eight) hours as needed for pain.     b complex vitamins capsule Take 1 capsule by mouth daily.     Calcium Carb-Cholecalciferol (CALCIUM 600 + D PO) Take 1 tablet by mouth daily.     carvedilol (COREG) 12.5 MG tablet Take 12.5 mg by mouth 2 (two) times daily with a meal.      Cholecalciferol (VITAMIN D3) 125 MCG (5000 UT) capsule Take 5,000 Units by mouth daily.     cyanocobalamin (VITAMIN B12) 1000 MCG tablet Take 1,000 mcg by mouth daily.     dexamethasone (DECADRON) 4 MG tablet Take 2 tablets daily for 2 days, start the day after chemotherapy. Take with food. 30 tablet 1   ELIQUIS 5 MG TABS tablet TAKE 1 TABLET BY MOUTH TWICE A DAY 60 tablet 5   FIBER PO Take 1 capsule by mouth 2 (two) times daily.     Fiber POWD Take 1 Scoop by mouth daily.     fluconazole (DIFLUCAN) 100  MG tablet Take 1 tablet (100 mg total) by mouth daily for 20 days. Hold Simvastatin while on this medication 20 tablet 0   gabapentin (NEURONTIN) 300 MG capsule Take 300 mg by mouth 3 (  three) times daily.     lidocaine (XYLOCAINE) 2 % solution Use as directed 15 mLs in the mouth or throat as needed for mouth pain. 100 mL 0   lidocaine-prilocaine (EMLA) cream Apply to affected area once 30 g 3   loratadine (CLARITIN) 10 MG tablet Take 10 mg by mouth daily.     magic mouthwash w/lidocaine SOLN Take 5 mLs by mouth 4 (four) times daily as needed for mouth pain. Suspension contains equal amounts of Maalox Extra Strength, nystatin, diphenhydramine and lidocaine. 400 mL 0   nystatin (MYCOSTATIN) 100000 UNIT/ML suspension Take 5 mLs (500,000 Units total) by mouth 4 (four) times daily. 100 mL 0   Omega-3 Fatty Acids (FISH OIL) 1000 MG CAPS Take 1,000 mg by mouth daily.      ondansetron (ZOFRAN) 8 MG tablet Take 1 tablet (8 mg total) by mouth every 8 (eight) hours as needed for nausea or vomiting. Start on the third day after chemotherapy. 30 tablet 1   prochlorperazine (COMPAZINE) 10 MG tablet Take 1 tablet (10 mg total) by mouth every 6 (six) hours as needed for nausea or vomiting. 30 tablet 1   ramipril (ALTACE) 2.5 MG tablet Take 2.5 mg by mouth daily.       simvastatin (ZOCOR) 40 MG tablet Take 40 mg by mouth at bedtime.       sucralfate (CARAFATE) 1 g tablet Take 1 tablet (1 g total) by mouth 4 (four) times daily -  with meals and at bedtime. Dissolve tab in 5-10 ml of water to make a thick slurry and then swallow it. 90 tablet 0   tiZANidine (ZANAFLEX) 4 MG tablet Take 2 mg by mouth daily as needed for muscle spasms.     traZODone (DESYREL) 50 MG tablet Take 50 mg by mouth at bedtime.      venlafaxine (EFFEXOR) 75 MG tablet Take 75 mg by mouth daily.     vitamin E 400 UNIT capsule Take 400 Units by mouth daily.     zonisamide (ZONEGRAN) 50 MG capsule Take 50-100 mg by mouth See admin instructions. Take  50mg  by mouth in the morning and 100mg  at night.     No current facility-administered medications for this visit.    REVIEW OF SYSTEMS:   Constitutional: ( - ) fevers, ( - )  chills , ( - ) night sweats Eyes: ( - ) blurriness of vision, ( - ) double vision, ( - ) watery eyes Ears, nose, mouth, throat, and face: ( - ) mucositis, ( - ) sore throat Respiratory: ( - ) cough, ( - ) dyspnea, ( - ) wheezes Cardiovascular: ( - ) palpitation, ( - ) chest discomfort, ( - ) lower extremity swelling Gastrointestinal:  ( - ) nausea, ( - ) heartburn, ( - ) change in bowel habits Skin: ( - ) abnormal skin rashes Lymphatics: ( - ) new lymphadenopathy, ( - ) easy bruising Neurological: ( - ) numbness, ( - ) tingling, ( - ) new weaknesses Behavioral/Psych: ( - ) mood change, ( - ) new changes  All other systems were reviewed with the patient and are negative.  PHYSICAL EXAMINATION: ECOG PERFORMANCE STATUS: 2 - Symptomatic, <50% confined to bed  Vitals:   10/23/23 1154  BP: 104/62  Pulse: 88  Resp: 15  Temp: 98 F (36.7 C)  SpO2: 99%   Filed Weights   10/23/23 1154  Weight: 152 lb 8 oz (69.2 kg)    GENERAL: alert, no distress and  comfortable SKIN: skin color, texture, turgor are normal, no rashes or significant lesions EYES: conjunctiva are pink and non-injected, sclera clear OROPHARYNX: no exudate, no erythema; lips, buccal mucosa, and tongue normal  NECK: supple, non-tender LYMPH:  no palpable lymphadenopathy in the cervical, axillary or inguinal LUNGS: clear to auscultation and percussion with normal breathing effort HEART: regular rate & rhythm and no murmurs and no lower extremity edema ABDOMEN: soft, non-tender, non-distended, normal bowel sounds Musculoskeletal: no cyanosis of digits and no clubbing  PSYCH: alert & oriented x 3, fluent speech NEURO: no focal motor/sensory deficits  LABORATORY DATA:  I have reviewed the data as listed    Latest Ref Rng & Units 10/23/2023   11:31  AM 10/16/2023   11:23 AM 10/09/2023    1:14 PM  CBC  WBC 4.0 - 10.5 K/uL 1.9  2.7  2.1   Hemoglobin 12.0 - 15.0 g/dL 81.1  91.4  78.2   Hematocrit 36.0 - 46.0 % 35.5  37.4  36.4   Platelets 150 - 400 K/uL 95  102  97        Latest Ref Rng & Units 10/23/2023   11:31 AM 10/16/2023   11:23 AM 10/09/2023    1:14 PM  CMP  Glucose 70 - 99 mg/dL 956  213  086   BUN 8 - 23 mg/dL 13  16  12    Creatinine 0.44 - 1.00 mg/dL 5.78  4.69  6.29   Sodium 135 - 145 mmol/L 137  138  141   Potassium 3.5 - 5.1 mmol/L 4.2  3.8  4.0   Chloride 98 - 111 mmol/L 107  104  107   CO2 22 - 32 mmol/L 26  27  29    Calcium 8.9 - 10.3 mg/dL 9.5  9.6  9.2   Total Protein 6.5 - 8.1 g/dL 6.6  6.7  6.3   Total Bilirubin 0.0 - 1.2 mg/dL 0.6  0.6  0.4   Alkaline Phos 38 - 126 U/L 68  62  59   AST 15 - 41 U/L 21  19  16    ALT 0 - 44 U/L 18  18  16      No results found for: "MPROTEIN" No results found for: "KPAFRELGTCHN", "LAMBDASER", "KAPLAMBRATIO"   RADIOGRAPHIC STUDIES: No results found.  ASSESSMENT & PLAN DALEIZA BACCHI 78 y.o. female with medical history significant for squamous cell carcinoma of the lung, stage III currently undergoing chemoradiation who presents for a follow up visit.  PLAN: -Discussed lab results from today, 10/23/2023, in detail with the patient. CBC shows low WBC of 1.9K and low platelets of 95 K. Neutrophils dropped to 1.4, currently neutropenic . CMP  stable  -Discussed with the patient that the throat pain/swallowing problem is a common side effect of radiation treatment. Prescribed magic mouth was as needed and sucralfate to help with the throat pain.  Patient was unable to get this compounded. -Discussed with the patient that the next treatment plan after completing radiation treatment will be immunotherapy, but we will discuss the treatment option after she completes her radiation treatment.  -Patient tolerated cycle 5 of her treatment well without any new or severe toxicities.  -HOLD  treatment as patient is neutropenic  -Continue radiation treatment. Last radiation treatment on 11/02/2023.    # Headaches -patient is having HA, Rad/Onc recommended repeat head CT scan. Bilateral temporal HA, low suspicion it represents metastatic disease.  --most recent imaging in Feb 2025 showed 1.1 cm lytic lesion  in left frontal bone, not noted on PET.    FOLLOW-UP: RTC with Dr Salomon Cree next week to consider administering chemo if counts improve  No orders of the defined types were placed in this encounter.   All questions were answered. The patient knows to call the clinic with any problems, questions or concerns.  A total of more than 30 minutes were spent on this encounter with face-to-face time and non-face-to-face time, including preparing to see the patient, ordering tests and/or medications, counseling the patient and coordination of care as outlined above.   Rogerio Clay, MD Department of Hematology/Oncology [facility] Phone: [phone] Pager: [phone] Email: [email or web address]  10/23/2023 2:04 PM

## 2023-10-24 ENCOUNTER — Telehealth: Payer: Self-pay

## 2023-10-24 ENCOUNTER — Other Ambulatory Visit: Payer: Self-pay

## 2023-10-24 ENCOUNTER — Ambulatory Visit
Admission: RE | Admit: 2023-10-24 | Discharge: 2023-10-24 | Disposition: A | Discharge status: Home / Self Care | Source: Ambulatory Visit | Attending: Radiation Oncology | Admitting: Radiation Oncology

## 2023-10-24 DIAGNOSIS — C83398 Diffuse large b-cell lymphoma of other extranodal and solid organ sites: Secondary | ICD-10-CM | POA: Diagnosis not present

## 2023-10-24 DIAGNOSIS — C3411 Malignant neoplasm of upper lobe, right bronchus or lung: Secondary | ICD-10-CM | POA: Diagnosis not present

## 2023-10-24 DIAGNOSIS — Z5111 Encounter for antineoplastic chemotherapy: Secondary | ICD-10-CM | POA: Diagnosis not present

## 2023-10-24 DIAGNOSIS — Z87891 Personal history of nicotine dependence: Secondary | ICD-10-CM | POA: Diagnosis not present

## 2023-10-24 DIAGNOSIS — C7801 Secondary malignant neoplasm of right lung: Secondary | ICD-10-CM | POA: Diagnosis not present

## 2023-10-24 DIAGNOSIS — Z510 Encounter for antineoplastic radiation therapy: Secondary | ICD-10-CM | POA: Diagnosis not present

## 2023-10-24 LAB — RAD ONC ARIA SESSION SUMMARY
Course Elapsed Days: 35
Plan Fractions Treated to Date: 26
Plan Prescribed Dose Per Fraction: 2 Gy
Plan Total Fractions Prescribed: 33
Plan Total Prescribed Dose: 66 Gy
Reference Point Dosage Given to Date: 52 Gy
Reference Point Session Dosage Given: 2 Gy
Session Number: 26

## 2023-10-24 NOTE — Telephone Encounter (Signed)
-----   Message from Nurse Costella Dirks sent at 10/23/2023  5:34 PM EDT ----- Regarding: RE: HA Thanks for letting us  know. Dr Salomon Cree is not here this week but she saw Dr. Rosaline Coma today and I let him know.  Costella Dirks RN ----- Message ----- From: Shelbie Dess, RN Sent: 10/23/2023   9:28 AM EDT To: Earline Glenn, RN Subject: HA                                             Radiation under treat appointment this morning.  Ms. Dockendorf c/o headache since last Thursday.  She states that she has a history of migraines and believes that is what this is.  Wanted to make you and Dr. Salomon Cree aware.

## 2023-10-25 ENCOUNTER — Other Ambulatory Visit: Payer: Self-pay

## 2023-10-25 ENCOUNTER — Ambulatory Visit
Admission: RE | Admit: 2023-10-25 | Discharge: 2023-10-25 | Disposition: A | Discharge status: Home / Self Care | Source: Ambulatory Visit | Attending: Radiation Oncology | Admitting: Radiation Oncology

## 2023-10-25 DIAGNOSIS — C7801 Secondary malignant neoplasm of right lung: Secondary | ICD-10-CM | POA: Diagnosis not present

## 2023-10-25 DIAGNOSIS — C83398 Diffuse large b-cell lymphoma of other extranodal and solid organ sites: Secondary | ICD-10-CM | POA: Diagnosis not present

## 2023-10-25 DIAGNOSIS — Z510 Encounter for antineoplastic radiation therapy: Secondary | ICD-10-CM | POA: Diagnosis not present

## 2023-10-25 LAB — RAD ONC ARIA SESSION SUMMARY
Course Elapsed Days: 36
Plan Fractions Treated to Date: 27
Plan Prescribed Dose Per Fraction: 2 Gy
Plan Total Fractions Prescribed: 33
Plan Total Prescribed Dose: 66 Gy
Reference Point Dosage Given to Date: 54 Gy
Reference Point Session Dosage Given: 2 Gy
Session Number: 27

## 2023-10-26 ENCOUNTER — Telehealth: Payer: Self-pay | Admitting: Hematology

## 2023-10-26 ENCOUNTER — Other Ambulatory Visit: Payer: Self-pay

## 2023-10-26 ENCOUNTER — Ambulatory Visit
Admission: RE | Admit: 2023-10-26 | Discharge: 2023-10-26 | Disposition: A | Discharge status: Home / Self Care | Source: Ambulatory Visit | Attending: Radiation Oncology | Admitting: Radiation Oncology

## 2023-10-26 DIAGNOSIS — Z510 Encounter for antineoplastic radiation therapy: Secondary | ICD-10-CM | POA: Diagnosis not present

## 2023-10-26 DIAGNOSIS — C3411 Malignant neoplasm of upper lobe, right bronchus or lung: Secondary | ICD-10-CM | POA: Diagnosis not present

## 2023-10-26 DIAGNOSIS — C83398 Diffuse large b-cell lymphoma of other extranodal and solid organ sites: Secondary | ICD-10-CM | POA: Diagnosis not present

## 2023-10-26 DIAGNOSIS — C7801 Secondary malignant neoplasm of right lung: Secondary | ICD-10-CM | POA: Diagnosis not present

## 2023-10-26 DIAGNOSIS — Z5111 Encounter for antineoplastic chemotherapy: Secondary | ICD-10-CM | POA: Diagnosis not present

## 2023-10-26 DIAGNOSIS — Z87891 Personal history of nicotine dependence: Secondary | ICD-10-CM | POA: Diagnosis not present

## 2023-10-26 LAB — RAD ONC ARIA SESSION SUMMARY
Course Elapsed Days: 37
Plan Fractions Treated to Date: 28
Plan Prescribed Dose Per Fraction: 2 Gy
Plan Total Fractions Prescribed: 33
Plan Total Prescribed Dose: 66 Gy
Reference Point Dosage Given to Date: 56 Gy
Reference Point Session Dosage Given: 2 Gy
Session Number: 28

## 2023-10-26 NOTE — Telephone Encounter (Signed)
 Spoke with patient confirming upcoming appointment

## 2023-10-29 ENCOUNTER — Other Ambulatory Visit: Payer: Self-pay

## 2023-10-29 ENCOUNTER — Inpatient Hospital Stay

## 2023-10-29 ENCOUNTER — Ambulatory Visit

## 2023-10-29 DIAGNOSIS — C8338 Diffuse large B-cell lymphoma, lymph nodes of multiple sites: Secondary | ICD-10-CM

## 2023-10-29 DIAGNOSIS — Z87891 Personal history of nicotine dependence: Secondary | ICD-10-CM | POA: Diagnosis not present

## 2023-10-29 DIAGNOSIS — C7801 Secondary malignant neoplasm of right lung: Secondary | ICD-10-CM | POA: Diagnosis not present

## 2023-10-29 DIAGNOSIS — C83398 Diffuse large b-cell lymphoma of other extranodal and solid organ sites: Secondary | ICD-10-CM | POA: Diagnosis not present

## 2023-10-29 DIAGNOSIS — C3411 Malignant neoplasm of upper lobe, right bronchus or lung: Secondary | ICD-10-CM | POA: Diagnosis not present

## 2023-10-29 DIAGNOSIS — Z5111 Encounter for antineoplastic chemotherapy: Secondary | ICD-10-CM | POA: Diagnosis not present

## 2023-10-29 DIAGNOSIS — Z510 Encounter for antineoplastic radiation therapy: Secondary | ICD-10-CM | POA: Diagnosis not present

## 2023-10-29 LAB — CMP (CANCER CENTER ONLY)
ALT: 15 U/L (ref 0–44)
AST: 19 U/L (ref 15–41)
Albumin: 4 g/dL (ref 3.5–5.0)
Alkaline Phosphatase: 70 U/L (ref 38–126)
Anion gap: 6 (ref 5–15)
BUN: 11 mg/dL (ref 8–23)
CO2: 25 mmol/L (ref 22–32)
Calcium: 9 mg/dL (ref 8.9–10.3)
Chloride: 108 mmol/L (ref 98–111)
Creatinine: 0.86 mg/dL (ref 0.44–1.00)
GFR, Estimated: >60 mL/min (ref >60)
Glucose, Bld: 107 mg/dL — ABNORMAL HIGH (ref 70–99)
Potassium: 4.2 mmol/L (ref 3.5–5.1)
Sodium: 139 mmol/L (ref 135–145)
Total Bilirubin: 0.5 mg/dL (ref 0.0–1.2)
Total Protein: 6.4 g/dL — ABNORMAL LOW (ref 6.5–8.1)

## 2023-10-29 LAB — CBC WITH DIFFERENTIAL (CANCER CENTER ONLY)
Abs Immature Granulocytes: 0.02 10*3/uL (ref 0.00–0.07)
Basophils Absolute: 0 10*3/uL (ref 0.0–0.1)
Basophils Relative: 1 %
Eosinophils Absolute: 0 10*3/uL (ref 0.0–0.5)
Eosinophils Relative: 1 %
HCT: 34.5 % — ABNORMAL LOW (ref 36.0–46.0)
Hemoglobin: 11.7 g/dL — ABNORMAL LOW (ref 12.0–15.0)
Immature Granulocytes: 1 %
Lymphocytes Relative: 12 %
Lymphs Abs: 0.2 10*3/uL — ABNORMAL LOW (ref 0.7–4.0)
MCH: 30.8 pg (ref 26.0–34.0)
MCHC: 33.9 g/dL (ref 30.0–36.0)
MCV: 90.8 fL (ref 80.0–100.0)
Monocytes Absolute: 0.4 10*3/uL (ref 0.1–1.0)
Monocytes Relative: 24 %
Neutro Abs: 1 10*3/uL — ABNORMAL LOW (ref 1.7–7.7)
Neutrophils Relative %: 61 %
Platelet Count: 99 10*3/uL — ABNORMAL LOW (ref 150–400)
RBC: 3.8 MIL/uL — ABNORMAL LOW (ref 3.87–5.11)
RDW: 16.3 % — ABNORMAL HIGH (ref 11.5–15.5)
WBC Count: 1.6 10*3/uL — ABNORMAL LOW (ref 4.0–10.5)
nRBC: 0 % (ref 0.0–0.2)

## 2023-10-29 LAB — RAD ONC ARIA SESSION SUMMARY
Course Elapsed Days: 40
Plan Fractions Treated to Date: 29
Plan Prescribed Dose Per Fraction: 2 Gy
Plan Total Fractions Prescribed: 33
Plan Total Prescribed Dose: 66 Gy
Reference Point Dosage Given to Date: 58 Gy
Reference Point Session Dosage Given: 2 Gy
Session Number: 29

## 2023-10-29 LAB — LACTATE DEHYDROGENASE: LDH: 192 U/L (ref 98–192)

## 2023-10-29 NOTE — Progress Notes (Signed)
 Pt presented for D1C6 Paclitaxel /Carboplatin . Platelets 99, ANC 1.0. VSS, pt endorses fatigue 2+ but no other changes. Per Salomon Cree, MD, no treatment today and plan to d/c last dose of chemotherapy and come back in 4 weeks for Flush w Lab/Kale/Immunotherapy. Pt verbalized understanding. Port heparin  locked and deaccessed. Pt d/c to lobby via ambulation.

## 2023-10-30 ENCOUNTER — Ambulatory Visit
Admission: RE | Admit: 2023-10-30 | Discharge: 2023-10-30 | Disposition: A | Discharge status: Home / Self Care | Source: Ambulatory Visit | Attending: Radiation Oncology | Admitting: Radiation Oncology

## 2023-10-30 ENCOUNTER — Other Ambulatory Visit: Payer: Self-pay

## 2023-10-30 DIAGNOSIS — C7801 Secondary malignant neoplasm of right lung: Secondary | ICD-10-CM | POA: Diagnosis not present

## 2023-10-30 DIAGNOSIS — Z510 Encounter for antineoplastic radiation therapy: Secondary | ICD-10-CM | POA: Diagnosis not present

## 2023-10-30 DIAGNOSIS — Z87891 Personal history of nicotine dependence: Secondary | ICD-10-CM | POA: Diagnosis not present

## 2023-10-30 DIAGNOSIS — Z5111 Encounter for antineoplastic chemotherapy: Secondary | ICD-10-CM | POA: Diagnosis not present

## 2023-10-30 DIAGNOSIS — C83398 Diffuse large b-cell lymphoma of other extranodal and solid organ sites: Secondary | ICD-10-CM | POA: Diagnosis not present

## 2023-10-30 DIAGNOSIS — C3411 Malignant neoplasm of upper lobe, right bronchus or lung: Secondary | ICD-10-CM | POA: Diagnosis not present

## 2023-10-30 LAB — RAD ONC ARIA SESSION SUMMARY
Course Elapsed Days: 41
Plan Fractions Treated to Date: 30
Plan Prescribed Dose Per Fraction: 2 Gy
Plan Total Fractions Prescribed: 33
Plan Total Prescribed Dose: 66 Gy
Reference Point Dosage Given to Date: 60 Gy
Reference Point Session Dosage Given: 2 Gy
Session Number: 30

## 2023-10-31 ENCOUNTER — Ambulatory Visit
Admission: RE | Admit: 2023-10-31 | Discharge: 2023-10-31 | Disposition: A | Discharge status: Home / Self Care | Source: Ambulatory Visit | Attending: Radiation Oncology

## 2023-10-31 ENCOUNTER — Other Ambulatory Visit: Payer: Self-pay

## 2023-10-31 DIAGNOSIS — Z510 Encounter for antineoplastic radiation therapy: Secondary | ICD-10-CM | POA: Diagnosis not present

## 2023-10-31 DIAGNOSIS — C3411 Malignant neoplasm of upper lobe, right bronchus or lung: Secondary | ICD-10-CM | POA: Diagnosis not present

## 2023-10-31 DIAGNOSIS — M25531 Pain in right wrist: Secondary | ICD-10-CM | POA: Diagnosis not present

## 2023-10-31 DIAGNOSIS — C7801 Secondary malignant neoplasm of right lung: Secondary | ICD-10-CM | POA: Diagnosis not present

## 2023-10-31 DIAGNOSIS — C83398 Diffuse large b-cell lymphoma of other extranodal and solid organ sites: Secondary | ICD-10-CM | POA: Diagnosis not present

## 2023-10-31 DIAGNOSIS — Z87891 Personal history of nicotine dependence: Secondary | ICD-10-CM | POA: Diagnosis not present

## 2023-10-31 DIAGNOSIS — Z5111 Encounter for antineoplastic chemotherapy: Secondary | ICD-10-CM | POA: Diagnosis not present

## 2023-10-31 LAB — RAD ONC ARIA SESSION SUMMARY
Course Elapsed Days: 42
Plan Fractions Treated to Date: 31
Plan Prescribed Dose Per Fraction: 2 Gy
Plan Total Fractions Prescribed: 33
Plan Total Prescribed Dose: 66 Gy
Reference Point Dosage Given to Date: 62 Gy
Reference Point Session Dosage Given: 2 Gy
Session Number: 31

## 2023-11-01 ENCOUNTER — Ambulatory Visit
Admission: RE | Admit: 2023-11-01 | Discharge: 2023-11-01 | Disposition: A | Discharge status: Home / Self Care | Source: Ambulatory Visit | Attending: Radiation Oncology

## 2023-11-01 ENCOUNTER — Other Ambulatory Visit: Payer: Self-pay

## 2023-11-01 DIAGNOSIS — Z510 Encounter for antineoplastic radiation therapy: Secondary | ICD-10-CM | POA: Diagnosis not present

## 2023-11-01 DIAGNOSIS — C83398 Diffuse large b-cell lymphoma of other extranodal and solid organ sites: Secondary | ICD-10-CM | POA: Diagnosis not present

## 2023-11-01 DIAGNOSIS — M25531 Pain in right wrist: Secondary | ICD-10-CM | POA: Diagnosis not present

## 2023-11-01 DIAGNOSIS — C7801 Secondary malignant neoplasm of right lung: Secondary | ICD-10-CM | POA: Diagnosis not present

## 2023-11-01 LAB — RAD ONC ARIA SESSION SUMMARY
Course Elapsed Days: 43
Plan Fractions Treated to Date: 32
Plan Prescribed Dose Per Fraction: 2 Gy
Plan Total Fractions Prescribed: 33
Plan Total Prescribed Dose: 66 Gy
Reference Point Dosage Given to Date: 64 Gy
Reference Point Session Dosage Given: 2 Gy
Session Number: 32

## 2023-11-02 ENCOUNTER — Other Ambulatory Visit: Payer: Self-pay

## 2023-11-02 ENCOUNTER — Ambulatory Visit
Admission: RE | Admit: 2023-11-02 | Discharge: 2023-11-02 | Disposition: A | Discharge status: Home / Self Care | Source: Ambulatory Visit | Attending: Radiation Oncology | Admitting: Radiation Oncology

## 2023-11-02 DIAGNOSIS — Z5111 Encounter for antineoplastic chemotherapy: Secondary | ICD-10-CM | POA: Diagnosis not present

## 2023-11-02 DIAGNOSIS — C7801 Secondary malignant neoplasm of right lung: Secondary | ICD-10-CM | POA: Diagnosis not present

## 2023-11-02 DIAGNOSIS — Z510 Encounter for antineoplastic radiation therapy: Secondary | ICD-10-CM | POA: Diagnosis not present

## 2023-11-02 DIAGNOSIS — C83398 Diffuse large b-cell lymphoma of other extranodal and solid organ sites: Secondary | ICD-10-CM | POA: Diagnosis not present

## 2023-11-02 DIAGNOSIS — Z87891 Personal history of nicotine dependence: Secondary | ICD-10-CM | POA: Diagnosis not present

## 2023-11-02 DIAGNOSIS — C3411 Malignant neoplasm of upper lobe, right bronchus or lung: Secondary | ICD-10-CM | POA: Diagnosis not present

## 2023-11-02 LAB — RAD ONC ARIA SESSION SUMMARY
Course Elapsed Days: 44
Plan Fractions Treated to Date: 33
Plan Prescribed Dose Per Fraction: 2 Gy
Plan Total Fractions Prescribed: 33
Plan Total Prescribed Dose: 66 Gy
Reference Point Dosage Given to Date: 66 Gy
Reference Point Session Dosage Given: 2 Gy
Session Number: 33

## 2023-11-05 NOTE — Radiation Completion Notes (Signed)
 Patient Name: Rachel Bates, Rachel Bates MRN: [id number] Date of Birth: 06-16-1946 Referring Physician: Clydene Darner, M.D. Date of Service: 2023-11-05 Radiation Oncologist: Verlie Glisson, M.D. 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 Staging on 2023-09-10: Cancer of upper lobe of right lung (HCC) T=cT1c, N=cN2, M=cM0 Intent: Curative     ==========DELIVERED PLANS==========  First Treatment Date: 2023-09-19 Last Treatment Date: 2023-11-02   Plan Name: Lung_R Site: Lung, Right Technique: IMRT Mode: Photon Dose Per Fraction: 2 Gy Prescribed Dose (Delivered / Prescribed): 66 Gy / 66 Gy Prescribed Fxs (Delivered / Prescribed): 33 / 33     ==========ON TREATMENT VISIT DATES========== 2023-09-25, 2023-09-27, 2023-10-04, 2023-10-09, 2023-10-16, 2023-10-23, 2023-10-30, 2023-11-02     ==========UPCOMING VISITS==========       ==========APPENDIX - ON TREATMENT VISIT NOTES==========   See weekly On Treatment Notes in Epic for details in the Media tab (listed as Progress notes on the On Treatment Visit Dates listed above).

## 2023-11-06 ENCOUNTER — Ambulatory Visit: Admitting: Radiation Oncology

## 2023-11-07 ENCOUNTER — Other Ambulatory Visit: Payer: Self-pay | Admitting: Hematology

## 2023-11-15 ENCOUNTER — Other Ambulatory Visit: Payer: Self-pay | Admitting: Hematology

## 2023-11-15 DIAGNOSIS — Z7189 Other specified counseling: Secondary | ICD-10-CM

## 2023-11-15 DIAGNOSIS — C3491 Malignant neoplasm of unspecified part of right bronchus or lung: Secondary | ICD-10-CM

## 2023-11-29 ENCOUNTER — Ambulatory Visit: Payer: Self-pay | Admitting: Radiation Oncology

## 2023-11-29 ENCOUNTER — Inpatient Hospital Stay
Admission: RE | Admit: 2023-11-29 | Discharge: 2023-11-29 | Disposition: A | Discharge status: Home / Self Care | Source: Ambulatory Visit | Admitting: Radiation Oncology

## 2023-12-04 ENCOUNTER — Telehealth: Payer: Self-pay | Admitting: Radiation Oncology

## 2023-12-04 ENCOUNTER — Ambulatory Visit: Payer: Medicare Other

## 2023-12-04 ENCOUNTER — Other Ambulatory Visit: Payer: Self-pay

## 2023-12-04 DIAGNOSIS — C8338 Diffuse large B-cell lymphoma, lymph nodes of multiple sites: Secondary | ICD-10-CM

## 2023-12-04 NOTE — Telephone Encounter (Signed)
 12/04/23 Patient cancelled appt and did not want to reschedule.

## 2023-12-05 ENCOUNTER — Ambulatory Visit: Admitting: Radiation Oncology

## 2023-12-06 ENCOUNTER — Other Ambulatory Visit: Payer: Self-pay

## 2023-12-06 DIAGNOSIS — R202 Paresthesia of skin: Secondary | ICD-10-CM

## 2023-12-07 ENCOUNTER — Ambulatory Visit: Admitting: Neurology

## 2023-12-07 DIAGNOSIS — G5601 Carpal tunnel syndrome, right upper limb: Secondary | ICD-10-CM

## 2023-12-07 DIAGNOSIS — R202 Paresthesia of skin: Secondary | ICD-10-CM

## 2023-12-07 NOTE — Procedures (Signed)
  [facility]  [address], [address]  [place], [place] [address] Tel: [phone] Fax: [phone] Test Date:  12/07/2023  Patient: Rachel Bates DOB: 1945-12-24 Physician: Reyna Cava, DO  Sex: Female Height: 5\' 7"  Ref Phys: Giles Labrum, MD  ID#: [id number]   Technician:    History: This is a 78 year old female referred for evaluation of right hand pain and numbness.  NCV & EMG Findings: Extensive electrodiagnostic testing of the right upper extremity shows:  Right median sensory response is absent.  Right ulnar sensory response is within normal limits. Right median motor response is absent.  Right ulnar motor response is within normal limits. No motor unit recruitment is seen in the right abductor pollicis brevis muscle.  There is no evidence of accompanying active denervation.  The remaining tested muscles show normal motor unit configuration and recruitment pattern.  Impression: Right median neuropathy at or distal to the wrist, consistent with a clinical diagnosis of carpal tunnel syndrome.  Overall, these findings are very severe in degree electrically.   ___________________________ Reyna Cava, DO    Nerve Conduction Studies   Stim Site NR Peak (ms) Norm Peak (ms) O-P Amp (V) Norm O-P Amp  Right Median Anti Sensory (2nd Digit)  32 C  Wrist *NR  <3.8  >10  Right Ulnar Anti Sensory (5th Digit)  32 C  Wrist    2.7 <3.2 8.9 >5     Stim Site NR Onset (ms) Norm Onset (ms) O-P Amp (mV) Norm O-P Amp Site1 Site2 Delta-0 (ms) Dist (cm) Vel (m/s) Norm Vel (m/s)  Right Median Motor (Abd Poll Brev)  32 C  Wrist *NR  <4.0  >5 Elbow Wrist 0.0   >50  Elbow *NR            Right Ulnar Motor (Abd Dig Minimi)  32 C  Wrist    1.6 <3.1 8.7 >7 B Elbow Wrist 3.8 22.0 58 >50  B Elbow    5.4  8.5  A Elbow B Elbow 1.9 10.0 53 >50  A Elbow    7.3  7.7          Electromyography   Side Muscle Ins.Act Fibs Fasc Recrt Amp Dur Poly Activation Comment  Right  1stDorInt Nml Nml Nml Nml Nml Nml Nml Nml N/A  Right Abd Poll Brev Nml Nml Nml *None *- *- *- Nml *ATR  Right PronatorTeres Nml Nml Nml Nml Nml Nml Nml Nml N/A  Right Biceps Nml Nml Nml Nml Nml Nml Nml Nml N/A  Right Triceps Nml Nml Nml Nml Nml Nml Nml Nml N/A  Right Deltoid Nml Nml Nml Nml Nml Nml Nml Nml N/A      Waveforms:

## 2023-12-08 NOTE — Progress Notes (Signed)
 HEMATOLOGY/ONCOLOGY CLINIC NOTE  Date of Service: 12/10/2023  Patient Care Team: Clydene Darner, PA-C as PCP - General (Internal Medicine) Tammie Fall, MD as PCP - Cardiology (Cardiology) Jeannette Mills, MD (Family Medicine) Frankie Israel, MD as Consulting Physician (Hematology)  CHIEF COMPLAINTS/PURPOSE OF CONSULTATION:  Management of recently diagnosed non small cel lung cancer Hx of DLBCL   HISTORY OF PRESENTING ILLNESS:   Rachel Bates is a wonderful 78 y.o. female who has been referred to us  by Sharren Decree, DO for evaluation and management of non hodgkin's lymphoma. She has a hx of chronic systolic heart failure.  Nasolaryngoscopy revealed a mass lesion of the left oropharynx, emanating from the left base of tongue/glossotonsillar sulcus. Patient subsequently had a CT neck with contrast performed on 08/09/2022, which reported extensive bilateral adenopaty, with greatest lymph node conglomerate measuring 3.2 x 2.4 cm in the right level 2. Patient underwent core needle biopsy on 08/18/22 and endorsed persistent throat pain and odynophagia at the time. She is a former smoker and quit in the late 90s. SHe endorsed heavy tobacco use prior to quitting, with 1.5 pack/day for about 30 years.   Today, she is accompanied by her daughter. She complains of a sore throat beginning around Thanksgiving time. She was given antibiotics and prednisone  which did not improve symptoms. She endorses swelling in her neck with associated soreness which came quickly past 2-3 weeks. She reports that her neck edema has recently grown on both the left and right sides. Her neck pain is in a specific area. She also complains of recent her neck pain if she turns her neck.  She did present Emergency EMT because something was stuck in her throat. Endoscopy revealed that it was food and it was pushed down. An ulcer was also found in her esophagus and some narrowing which will be stretched in  three months. She has not had any issues with swallowing food since then. She has not had a biopsy of issue on the back of her tongue. No other lumps/bumps, unexplained fevers, chills, night sweat, change in breathing, abdominal pain, or skin rashes. She reports discomfort with drinking tea which caused her to eat less as well.   She is allergic to Sumatriptan and another medication she cannot recall. She does not consume alcohol and has quit smoking in the 90s. 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 She believes the sore was caused by shoe discomfort.  She denies any facial puffiness, abdominal pain, recent change in bowel habits or urination. She reports that her defibrillator has never gone off.   She reports a ganglion cyst on her right knee, 2 knee replacements, and 2 hip replacements. She denies a Fhx of blood disorders. However, her sister has lung cancer and was a frequent smoker.  She is UTD with her vaccinations, incuding influenza, RSV, and COVID-19 booster. She has endorsed migraines since childhood. She lives on her own is able to complete daily activities independently. She will follow-up with Dr. Westley Hammers soon. She follows up with cardiologist regularly. Her a fib, pacemaker, and ICD device have been stable. She has not had a recent echo.   INTERVAL HISTORY:  [name] is a 78 y.o. female who presents today for follow up and management of squamous cell carcinoma of the lung.    Patient was last seen by me on 10/16/2023 and complained  of throat pain/burning and swallowing problem causing 5-pound weight-loss.    MEDICAL HISTORY:  Past Medical History:  Diagnosis Date   AICD (automatic cardioverter/defibrillator) present dual   [employer] ---  original placedment 2007/  generator change 2014 by dr Pete Brand taylor   Anemia    Anticoagulant long-term use    eliquis    Anxiety     Arthralgia of multiple joints    Arthritis pain    Benign hypertensive heart disease    CAD (coronary artery disease) primary cardiologist-- dr Pete Brand taylor   MI and 2 stents 1995 in [place]    Cancer [facility])    CHF NYHA class II, chronic, systolic (HCC)    followed by dr Pete Brand taylor   Degenerative disc disease, lumbar    Depression    Dyslipidemia    History of basal cell carcinoma (BCC) excision    right ankle area s/p  excision in office 06/ 2019  in office   History of DVT of lower extremity yrs ago before 2012   History of MI (myocardial infarction) 1995  in [place]   History of pulmonary embolus (PE) 2012   History of ventricular tachycardia    Hyperlipidemia    Hypersomnia    Hypertension    Ischemic cardiomyopathy    Migraines    Myocardial infarction (HCC)    OA (osteoarthritis)    "all over"   PAF (paroxysmal atrial fibrillation) (HCC)    Pneumonia    02/2023   Primary localized osteoarthritis of right hip 12/18/2017   Primary localized osteoarthritis of right knee 05/28/2018   S/P coronary artery stent placement 1995   in [facility]   05-21-2018 per pt x2  stents in same coronary artery (unsure BM or DES)   Vitamin D  deficiency disease     SURGICAL HISTORY: Past Surgical History:  Procedure Laterality Date   BIOPSY  06/19/2022   Procedure: BIOPSY;  Surgeon: Felecia Hopper, MD;  Location: [facility] ENDOSCOPY;  Service: Gastroenterology;;   BRONCHIAL BIOPSY  08/07/2023   Procedure: BRONCHIAL BIOPSIES;  Surgeon: Denson Flake, MD;  Location: [facility] ENDOSCOPY;  Service: Pulmonary;;   BRONCHIAL BRUSHINGS  08/07/2023   Procedure: BRONCHIAL BRUSHINGS;  Surgeon: Denson Flake, MD;  Location: [facility] ENDOSCOPY;  Service: Pulmonary;;   BRONCHIAL NEEDLE ASPIRATION BIOPSY  08/07/2023   Procedure: BRONCHIAL NEEDLE ASPIRATION BIOPSIES;  Surgeon: Denson Flake, MD;  Location: [facility] ENDOSCOPY;  Service: Pulmonary;;   CARDIAC DEFIBRILLATOR PLACEMENT  2007   CATARACT EXTRACTION, BILATERAL      COLONOSCOPY  04/14/2013   colonic polyp, status post polypectomy. Mild panocolonic diverticulosis. Small internal hemorrhoids   DILATION AND CURETTAGE OF UTERUS  yrs ago   DIRECT LARYNGOSCOPY Right 10/04/2022   Procedure: DIRECT LARYNGOSCOPY;  Surgeon: Daleen Dubs, DO;  Location: [facility] OR;  Service: ENT;  Laterality: Right;   ESOPHAGOGASTRODUODENOSCOPY (EGD) WITH PROPOFOL  N/A 06/19/2022   Procedure: ESOPHAGOGASTRODUODENOSCOPY (EGD) WITH PROPOFOL ;  Surgeon: Felecia Hopper, MD;  Location: [facility] ENDOSCOPY;  Service: Gastroenterology;  Laterality: N/A;   FIDUCIAL MARKER PLACEMENT  08/07/2023   Procedure: FIDUCIAL MARKER PLACEMENT;  Surgeon: Denson Flake, MD;  Location: [facility] ENDOSCOPY;  Service: Pulmonary;;   IMPACTION REMOVAL  06/19/2022   Procedure: IMPACTION REMOVAL;  Surgeon: Felecia Hopper, MD;  Location: [facility] ENDOSCOPY;  Service: Gastroenterology;;   IMPLANTABLE CARDIOVERTER DEFIBRILLATOR GENERATOR CHANGE N/A 02/04/2013   Procedure: IMPLANTABLE CARDIOVERTER DEFIBRILLATOR GENERATOR CHANGE;  Surgeon: Tammie Fall, MD;  Location: [facility] CATH LAB;  Service: Cardiovascular;  Laterality: N/A;  IR IMAGING GUIDED PORT INSERTION  10/27/2022   LYMPH NODE BIOPSY Right 10/04/2022   Procedure: EXCISIONAL OF RIGHT DEEP CERVICAL LYMPH NODE;  Surgeon: Daleen Dubs, DO;  Location: [facility] OR;  Service: ENT;  Laterality: Right;   SHOULDER ARTHROSCOPY Right 2015   TOTAL HIP ARTHROPLASTY Right 12/18/2017   Procedure: RIGHT TOTAL HIP ARTHROPLASTY;  Surgeon: Osa Blase, MD;  Location: [facility] OR;  Service: Orthopedics;  Laterality: Right;   TOTAL HIP ARTHROPLASTY Left 04/19/2021   Procedure: TOTAL HIP ARTHROPLASTY;  Surgeon: Osa Blase, MD;  Location: [facility] ORS;  Service: Orthopedics;  Laterality: Left;   TOTAL KNEE ARTHROPLASTY Right 05/28/2018   Procedure: TOTAL KNEE ARTHROPLASTY;  Surgeon: Osa Blase, MD;  Location: [facility] ORS;  Service: Orthopedics;  Laterality: Right;  Adductor Block   TOTAL KNEE  ARTHROPLASTY Left 08/12/2021   Procedure: TOTAL KNEE ARTHROPLASTY;  Surgeon: Osa Blase, MD;  Location: [facility] ORS;  Service: Orthopedics;  Laterality: Left;   TUBAL LIGATION Bilateral yrs ago   VIDEO BRONCHOSCOPY WITH RADIAL ENDOBRONCHIAL ULTRASOUND  08/07/2023   Procedure: VIDEO BRONCHOSCOPY WITH RADIAL ENDOBRONCHIAL ULTRASOUND;  Surgeon: Denson Flake, MD;  Location: [facility] ENDOSCOPY;  Service: Pulmonary;;   WISDOM TOOTH EXTRACTION      SOCIAL HISTORY: Social History   Socioeconomic History   Marital status: Single    Spouse name: Not on file   Number of children: 0   Years of education: 14   Highest education level: Not on file  Occupational History   Not on file  Tobacco Use   Smoking status: Former    Current packs/day: 0.00    Types: Cigarettes    Start date: 05/21/1966    Quit date: 05/21/1996    Years since quitting: 27.5   Smokeless tobacco: Never  Vaping Use   Vaping status: Never Used  Substance and Sexual Activity   Alcohol use: No   Drug use: Never   Sexual activity: Not on file  Other Topics Concern   Not on file  Social History Narrative   Not on file   Social Drivers of Health   Financial Resource Strain: Low Risk  (05/28/2018)   Overall Financial Resource Strain (CARDIA)    Difficulty of Paying Living Expenses: Not hard at all  Food Insecurity: No Food Insecurity (08/28/2023)   Hunger Vital Sign    Worried About Running Out of Food in the Last Year: Never true    Ran Out of Food in the Last Year: Never true  Transportation Needs: No Transportation Needs (08/28/2023)   PRAPARE - Administrator, Civil Service (Medical): No    Lack of Transportation (Non-Medical): No  Physical Activity: Not on file  Stress: Not on file  Social Connections: Not on file  Intimate Partner Violence: Not At Risk (08/28/2023)   Humiliation, Afraid, Rape, and Kick questionnaire    Fear of Current or Ex-Partner: No    Emotionally Abused: No    Physically Abused: No     Sexually Abused: No    FAMILY HISTORY: Family History  Problem Relation Age of Onset   Hypertension Mother    Thyroid  disease Mother    Alzheimer's disease Mother    Coronary artery disease Father    Pulmonary embolism Father    Congestive Heart Failure Maternal Grandmother    Hypertension Maternal Grandmother    Heart attack Maternal Grandfather    Other Maternal Grandfather        carotid disease   Dementia Paternal Grandmother  Other Paternal Grandfather 41       accident    ALLERGIES:  is allergic to sumatriptan succinate and amitriptyline.  MEDICATIONS:  Current Outpatient Medications  Medication Sig Dispense Refill   acetaminophen  (TYLENOL ) 650 MG CR tablet Take 1,300 mg by mouth every 8 (eight) hours as needed for pain.     b complex vitamins capsule Take 1 capsule by mouth daily.     Calcium  Carb-Cholecalciferol  (CALCIUM  600 + D PO) Take 1 tablet by mouth daily.     carvedilol  (COREG ) 12.5 MG tablet Take 12.5 mg by mouth 2 (two) times daily with a meal.      Cholecalciferol  (VITAMIN D3) 125 MCG (5000 UT) capsule Take 5,000 Units by mouth daily.     cyanocobalamin  (VITAMIN B12) 1000 MCG tablet Take 1,000 mcg by mouth daily.     dexamethasone  (DECADRON ) 4 MG tablet TAKE 2 TABLETS DAILY FOR 2 DAYS, START THE DAY AFTER CHEMOTHERAPY. TAKE WITH FOOD. 30 tablet 1   ELIQUIS  5 MG TABS tablet TAKE 1 TABLET BY MOUTH TWICE A DAY 60 tablet 5   FIBER PO Take 1 capsule by mouth 2 (two) times daily.     Fiber POWD Take 1 Scoop by mouth daily.     gabapentin  (NEURONTIN ) 300 MG capsule Take 300 mg by mouth 3 (three) times daily.     lidocaine  (XYLOCAINE ) 2 % solution Use as directed 15 mLs in the mouth or throat as needed for mouth pain. 100 mL 0   lidocaine -prilocaine  (EMLA ) cream Apply to affected area once 30 g 3   loratadine  (CLARITIN ) 10 MG tablet Take 10 mg by mouth daily.     magic mouthwash w/lidocaine  SOLN Take 5 mLs by mouth 4 (four) times daily as needed for mouth pain.  Suspension contains equal amounts of Maalox Extra Strength, nystatin , diphenhydramine  and lidocaine . 400 mL 0   nystatin  (MYCOSTATIN ) 100000 UNIT/ML suspension Take 5 mLs (500,000 Units total) by mouth 4 (four) times daily. 100 mL 0   Omega-3 Fatty Acids (FISH OIL) 1000 MG CAPS Take 1,000 mg by mouth daily.      ondansetron  (ZOFRAN ) 8 MG tablet Take 1 tablet (8 mg total) by mouth every 8 (eight) hours as needed for nausea or vomiting. Start on the third day after chemotherapy. 30 tablet 1   prochlorperazine  (COMPAZINE ) 10 MG tablet Take 1 tablet (10 mg total) by mouth every 6 (six) hours as needed for nausea or vomiting. 30 tablet 1   ramipril  (ALTACE ) 2.5 MG tablet Take 2.5 mg by mouth daily.       simvastatin  (ZOCOR ) 40 MG tablet Take 40 mg by mouth at bedtime.       sucralfate  (CARAFATE ) 1 g tablet TAKE 1 TABLET (1 G TOTAL) BY MOUTH 4 (FOUR) TIMES DAILY - WITH MEALS AND AT BEDTIME. DISSOLVE TAB IN 5-10 ML OF WATER  TO MAKE A THICK SLURRY AND THEN SWALLOW IT. 270 tablet 1   tiZANidine  (ZANAFLEX ) 4 MG tablet Take 2 mg by mouth daily as needed for muscle spasms.     traZODone  (DESYREL ) 50 MG tablet Take 50 mg by mouth at bedtime.      venlafaxine  (EFFEXOR ) 75 MG tablet Take 75 mg by mouth daily.     vitamin E  400 UNIT capsule Take 400 Units by mouth daily.     zonisamide  (ZONEGRAN ) 50 MG capsule Take 50-100 mg by mouth See admin instructions. Take 50mg  by mouth in the morning and 100mg  at night.  No current facility-administered medications for this visit.    REVIEW OF SYSTEMS:    10 Point review of Systems was done is negative except as noted above.   PHYSICAL EXAMINATION: .There were no vitals taken for this visit.   GENERAL:alert, in no acute distress and comfortable SKIN: no acute rashes, no significant lesions EYES: conjunctiva are pink and non-injected, sclera anicteric OROPHARYNX: MMM, no exudates, no oropharyngeal erythema or ulceration NECK: supple, no JVD LYMPH:  no palpable  lymphadenopathy in the cervical, axillary or inguinal regions LUNGS: clear to auscultation b/l with normal respiratory effort HEART: regular rate & rhythm ABDOMEN:  normoactive bowel sounds , non tender, not distended. Extremity: no pedal edema PSYCH: alert & oriented x 3 with fluent speech NEURO: no focal motor/sensory deficits   LABORATORY DATA:  I have reviewed the data as listed    Latest Ref Rng & Units 10/29/2023   11:09 AM 10/23/2023   11:31 AM 10/16/2023   11:23 AM  CBC  WBC 4.0 - 10.5 K/uL 1.6  1.9  2.7   Hemoglobin 12.0 - 15.0 g/dL 16.1  09.6  04.5   Hematocrit 36.0 - 46.0 % 34.5  35.5  37.4   Platelets 150 - 400 K/uL 99  95  102       Latest Ref Rng & Units 10/29/2023   11:09 AM 10/23/2023   11:31 AM 10/16/2023   11:23 AM  CMP  Glucose 70 - 99 mg/dL 409  811  914   BUN 8 - 23 mg/dL 11  13  16    Creatinine 0.44 - 1.00 mg/dL 7.82  9.56  2.13   Sodium 135 - 145 mmol/L 139  137  138   Potassium 3.5 - 5.1 mmol/L 4.2  4.2  3.8   Chloride 98 - 111 mmol/L 108  107  104   CO2 22 - 32 mmol/L 25  26  27    Calcium  8.9 - 10.3 mg/dL 9.0  9.5  9.6   Total Protein 6.5 - 8.1 g/dL 6.4  6.6  6.7   Total Bilirubin 0.0 - 1.2 mg/dL 0.5  0.6  0.6   Alkaline Phos 38 - 126 U/L 70  68  62   AST 15 - 41 U/L 19  21  19    ALT 0 - 44 U/L 15  18  18     Lab Results  Component Value Date   LDH 192 10/29/2023   Molecular Pathology 10/21/2022:    Surgical Pathology 10/04/22: A. LYMPH NODE, RIGHT LEVEL 2 DEEP CERVICAL, EXCISION:  -Diffuse large B-cell lymphoma  -See comment COMMENT: The sections show diffuse effacement of the lymph nodal architecture primarily by a population of large lymphoid cells characterized by vesicular chromatin and small nucleoli associated with apoptosis and brisk mitosis.  In some areas, the large atypical lymphoid proliferation extends into the perinodal adipose tissue.  In this background, there are scattered variably sized and somewhat disrupted aggregates  of primarily small lymphoid cells characterized by high nuclear cytoplasmic ratio, angulated nuclear contours and small to inconspicuous nucleoli. Flow cytometric analysis was performed [facility] [id number]) and shows a monoclonal, lambda restricted B-cell population expressing CD10.  In addition, immunohistochemical stains for CD3, CD5, CD10, CD20, PAX5, BCL6, Bcl-2, Ki-67, CD30, CD138, CD21, EBV in addition to in situ hybridization for kappa and lambda were performed with appropriate controls.  The large lymphoid cells are positive for CD20, PAX5, CD10, BCL6, Bcl-2, and partially for cytoplasmic lambda.  No significant staining is seen with  EBV, CD30, CD138 or cytoplasmic kappa.  Ki67 shows variably increased expression (more than 50% in some areas). CD21 highlights scattered somewhat disrupted follicular dendritic networks. The lymphoid aggregates of primarily small lymphoid cells previously described show positivity for B-cell markers CD20 and PAX5 in addition to CD10 and Bcl-2.  There is an admixed variable T-cell component in the background as seen with CD3 and CD5 and there is no apparent co-expression of CD5 in B-cell areas.  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    RADIOGRAPHIC STUDIES: I have personally reviewed the radiological images as listed and agreed with the findings in the report. NCV with EMG(electromyography) Result Date: 12/07/2023 Daryel Ensign, DO     12/07/2023  3:36 PM [facility] [address] [place], [address]  [place], [place] [address] Tel: [phone] Fax: [phone] Test Date:  12/07/2023 Patient: Reneshia Zuccaro DOB: [birth year] Physician: Reyna Cava, DO Sex: Female Height: 5\' 7"  Ref Phys: Giles Labrum, MD ID#: [id number]   Technician:  History: This is a 78 year old female referred for evaluation of right hand  pain and numbness. NCV & EMG Findings: Extensive electrodiagnostic testing of the right upper extremity shows: Right median sensory response is absent.  Right ulnar sensory response is within normal limits. Right median motor response is absent.  Right ulnar motor response is within normal limits. No motor unit recruitment is seen in the right abductor pollicis brevis muscle.  There is no evidence of accompanying active denervation.  The remaining tested muscles show normal motor unit configuration and recruitment pattern. Impression: Right median neuropathy at or distal to the wrist, consistent with a clinical diagnosis of carpal tunnel syndrome.  Overall, these findings are very severe in degree electrically. ___________________________ Reyna Cava, DO Nerve Conduction Studies  Stim Site NR Peak (ms) Norm Peak (ms) O-P Amp (V) Norm O-P Amp Right Median Anti Sensory (2nd Digit)  32 C Wrist *NR  <3.8  >10 Right Ulnar Anti Sensory (5th Digit)  32 C Wrist    2.7 <3.2 8.9 >5  Stim Site NR Onset (ms) Norm Onset (ms) O-P Amp (mV) Norm O-P Amp Site1 Site2 Delta-0 (ms) Dist (cm) Vel (m/s) Norm Vel (m/s) Right Median Motor (Abd Poll Brev)  32 C Wrist *NR  <4.0  >5 Elbow Wrist 0.0   >50 Elbow *NR           Right Ulnar Motor (Abd Dig Minimi)  32 C Wrist    1.6 <3.1 8.7 >7 B Elbow Wrist 3.8 22.0 58 >50 B Elbow    5.4  8.5  A Elbow B Elbow 1.9 10.0 53 >50 A Elbow    7.3  7.7        Electromyography  Side Muscle Ins.Act Fibs Fasc Recrt Amp Dur Poly Activation Comment Right 1stDorInt Nml Nml Nml Nml Nml Nml Nml Nml N/A Right Abd Poll Brev Nml Nml Nml *None *- *- *- Nml *ATR Right PronatorTeres Nml Nml Nml Nml Nml Nml Nml Nml N/A Right Biceps Nml Nml Nml Nml Nml Nml Nml Nml N/A Right Triceps Nml Nml Nml Nml Nml Nml Nml Nml N/A Right Deltoid Nml Nml Nml Nml Nml Nml Nml Nml N/A Waveforms:           NM PET 09/04/2022: IMPRESSION: 1. There is a large tracer avid mass centered within the left base of tongue and left lingual  region which extends into the hypopharyngeal region ventral to the epiglottis. Imaging findings are compatible with a primary head and neck malignancy. 2. A smaller focus of increased uptake localizes to the right pharyngeal tonsil region. Indeterminate. 3. Bilateral tracer avid cervical and supraclavicular lymph nodes compatible with nodal metastasis. 4. There is a 7 mm tracer avid nodule within the anteromedial left upper lobe which is suspicious for pulmonary metastasis. 5. There is increased uptake identified along the long axis of the posterior right seventh rib. No corresponding lytic or sclerotic changes identified on the CT images. Cannot exclude bone metastases. 6. Multiple prominent retroperitoneal and mesenteric lymph nodes are identified which exhibit mild tracer uptake. This is a nonspecific finding and may reflect reactive adenopathy. Metastatic adenopathy cannot be excluded. Attention on future surveillance imaging is advised. 7.  Aortic Atherosclerosis (ICD10-I70.0).   ASSESSMENT & PLAN:   78 y.o.  female with:   1. 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 2. 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 CMP stable LDH 166. -patient tolerated chemo-RT reasonably well and has no overt symptoms of lung cancer progression at this time and no significant residual treatment toxicities. -pet/ct to evaluate response to treatment -ct head to re-evaluate indeterminate bone  lesion. Pet/ct in 2 weeks CT head w and wo contrast in 2 weeks RTC with Dr Salomon Cree with portflush, labs and start of Immunotherapy in 3 weeks   FOLLOW-UP: Pet/ct in 2 weeks CT head w and wo contrast in 2 weeks RTC with Dr Salomon Cree with portflush, labs and start of Immunotherapy in 3 weeks   The total time spent in the appointment was 32 minutes* .  All of the patient's questions were answered with apparent satisfaction. The patient knows to call the clinic with any problems, questions or concerns.   Jacquelyn Matt MD MS AAHIVMS [facility] Hematology/Oncology Physician [facility]  .*Total Encounter Time as defined by the Centers for Medicare and Medicaid Services includes, in addition to the face-to-face time of a patient visit (documented in the note above) non-face-to-face time: obtaining and reviewing outside history, ordering and reviewing medications, tests or procedures, care coordination (communications with other health care professionals or caregivers) and documentation in the medical record.    I,Mitra Faeizi,acting as a Neurosurgeon for Jacquelyn Matt, MD.,have documented all relevant documentation on the behalf of Jacquelyn Matt, MD,as directed by  Jacquelyn Matt, MD while in the presence of Jacquelyn Matt, MD.  .I have reviewed the above documentation for accuracy and completeness, and I agree with the above. .Gautam Kishore Kale MD

## 2023-12-10 ENCOUNTER — Inpatient Hospital Stay: Attending: Hematology

## 2023-12-10 ENCOUNTER — Inpatient Hospital Stay: Admitting: Hematology

## 2023-12-10 VITALS — BP 112/57 | HR 79 | Temp 97.2°F | Resp 18 | Wt 153.8 lb

## 2023-12-10 DIAGNOSIS — Z79620 Long term (current) use of immunosuppressive biologic: Secondary | ICD-10-CM | POA: Insufficient documentation

## 2023-12-10 DIAGNOSIS — C8338 Diffuse large B-cell lymphoma, lymph nodes of multiple sites: Secondary | ICD-10-CM | POA: Diagnosis not present

## 2023-12-10 DIAGNOSIS — Z5112 Encounter for antineoplastic immunotherapy: Secondary | ICD-10-CM | POA: Insufficient documentation

## 2023-12-10 DIAGNOSIS — C3411 Malignant neoplasm of upper lobe, right bronchus or lung: Secondary | ICD-10-CM | POA: Insufficient documentation

## 2023-12-10 DIAGNOSIS — Z87891 Personal history of nicotine dependence: Secondary | ICD-10-CM | POA: Diagnosis not present

## 2023-12-10 DIAGNOSIS — Z7189 Other specified counseling: Secondary | ICD-10-CM | POA: Diagnosis not present

## 2023-12-10 DIAGNOSIS — C3491 Malignant neoplasm of unspecified part of right bronchus or lung: Secondary | ICD-10-CM

## 2023-12-10 DIAGNOSIS — Z95828 Presence of other vascular implants and grafts: Secondary | ICD-10-CM

## 2023-12-10 LAB — CMP (CANCER CENTER ONLY)
ALT: 13 U/L (ref 0–44)
AST: 17 U/L (ref 15–41)
Albumin: 4.1 g/dL (ref 3.5–5.0)
Alkaline Phosphatase: 77 U/L (ref 38–126)
Anion gap: 5 (ref 5–15)
BUN: 8 mg/dL (ref 8–23)
CO2: 28 mmol/L (ref 22–32)
Calcium: 9.2 mg/dL (ref 8.9–10.3)
Chloride: 108 mmol/L (ref 98–111)
Creatinine: 0.64 mg/dL (ref 0.44–1.00)
GFR, Estimated: >60 mL/min (ref >60)
Glucose, Bld: 104 mg/dL — ABNORMAL HIGH (ref 70–99)
Potassium: 3.9 mmol/L (ref 3.5–5.1)
Sodium: 141 mmol/L (ref 135–145)
Total Bilirubin: 0.5 mg/dL (ref 0.0–1.2)
Total Protein: 6.5 g/dL (ref 6.5–8.1)

## 2023-12-10 LAB — CBC WITH DIFFERENTIAL (CANCER CENTER ONLY)
Abs Immature Granulocytes: 0.01 10*3/uL (ref 0.00–0.07)
Basophils Absolute: 0 10*3/uL (ref 0.0–0.1)
Basophils Relative: 0 %
Eosinophils Absolute: 0.1 10*3/uL (ref 0.0–0.5)
Eosinophils Relative: 2 %
HCT: 37.3 % (ref 36.0–46.0)
Hemoglobin: 13 g/dL (ref 12.0–15.0)
Immature Granulocytes: 0 %
Lymphocytes Relative: 16 %
Lymphs Abs: 0.5 10*3/uL — ABNORMAL LOW (ref 0.7–4.0)
MCH: 32.4 pg (ref 26.0–34.0)
MCHC: 34.9 g/dL (ref 30.0–36.0)
MCV: 93 fL (ref 80.0–100.0)
Monocytes Absolute: 0.4 10*3/uL (ref 0.1–1.0)
Monocytes Relative: 13 %
Neutro Abs: 2.1 10*3/uL (ref 1.7–7.7)
Neutrophils Relative %: 69 %
Platelet Count: 95 10*3/uL — ABNORMAL LOW (ref 150–400)
RBC: 4.01 MIL/uL (ref 3.87–5.11)
RDW: 14.1 % (ref 11.5–15.5)
WBC Count: 3 10*3/uL — ABNORMAL LOW (ref 4.0–10.5)
nRBC: 0 % (ref 0.0–0.2)

## 2023-12-10 LAB — LACTATE DEHYDROGENASE: LDH: 166 U/L (ref 98–192)

## 2023-12-10 MED ORDER — SODIUM CHLORIDE 0.9% FLUSH: 10.0000 mL | Freq: Once | INTRAVENOUS | Status: DC

## 2023-12-10 MED ORDER — HEPARIN SOD (PORK) LOCK FLUSH 100 UNIT/ML IV SOLN: 500.0000 [IU] | Freq: Once | INTRAVENOUS | Status: DC

## 2023-12-10 NOTE — Patient Instructions (Signed)

## 2023-12-17 ENCOUNTER — Telehealth (HOSPITAL_BASED_OUTPATIENT_CLINIC_OR_DEPARTMENT_OTHER): Payer: Self-pay | Admitting: *Deleted

## 2023-12-17 ENCOUNTER — Encounter: Payer: Self-pay | Admitting: Hematology

## 2023-12-17 DIAGNOSIS — G5601 Carpal tunnel syndrome, right upper limb: Secondary | ICD-10-CM | POA: Diagnosis not present

## 2023-12-17 MED ORDER — ONDANSETRON HCL 8 MG PO TABS: 8.0000 mg | ORAL_TABLET | Freq: Three times a day (TID) | ORAL | 1 refills | Status: DC | PRN

## 2023-12-17 MED ORDER — LIDOCAINE-PRILOCAINE 2.5-2.5 % EX CREA: TOPICAL_CREAM | CUTANEOUS | 3 refills | Status: DC

## 2023-12-17 MED ORDER — PROCHLORPERAZINE MALEATE 10 MG PO TABS: 10.0000 mg | ORAL_TABLET | Freq: Four times a day (QID) | ORAL | 1 refills | Status: DC | PRN

## 2023-12-17 NOTE — Progress Notes (Signed)
 DISCONTINUE ON PATHWAY REGIMEN - Non-Small Cell Lung     A cycle is every 7 days, concurrent with RT:     Paclitaxel       Carboplatin    **Always confirm dose/schedule in your pharmacy ordering system**  REASON: Continuation Of Treatment PRIOR TREATMENT: [id number]: Carboplatin  AUC=2 + Paclitaxel  45 mg/m2 Weekly During Radiation TREATMENT RESPONSE: Complete Response (CR)  START ON PATHWAY REGIMEN - Non-Small Cell Lung     A cycle is every 14 days:     Durvalumab   **Always confirm dose/schedule in your pharmacy ordering system**  Patient Characteristics: Preoperative or Nonsurgical Candidate (Clinical Staging), Stage IIB (N2a only) or Stage III - Nonsurgical Candidate, PS = 0,1 Therapeutic Status: Preoperative or Nonsurgical Candidate (Clinical Staging) AJCC T Category: cTX AJCC N Category: cNX AJCC M Category: cM0 AJCC 9 Stage Grouping: IIIA Check here if patient was staged using an edition other than AJCC Staging 9th Edition: true ECOG Performance Status: 1 Intent of Therapy: Curative Intent, Discussed with Patient

## 2023-12-17 NOTE — Telephone Encounter (Signed)
   Pre-operative Risk Assessment    Patient Name: Rachel Bates  DOB: Nov 18, 1945 MRN: [id number]   Date of last office visit: 07/27/2023 Date of next office visit: 01/30/24  Request for Surgical Clearance    Procedure:  Carpal Tunnel Release  Date of Surgery:  Clearance TBD                                 Surgeon:  Dr. Osa Blase Surgeon's Group or Practice Name:  [facility] Phone number:  [phone] Fax number:  [phone]   Type of Clearance Requested:   - Medical  - Pharmacy:  Hold Apixaban  (Eliquis ) Not Indicated   Type of Anesthesia:  Choice   Additional requests/questions:    Signed, Lauris Port   12/17/2023, 4:17 PM

## 2023-12-17 NOTE — Telephone Encounter (Signed)
 We can submit if device clearance is requested. Last in office visit is UTD.

## 2023-12-20 ENCOUNTER — Telehealth: Payer: Self-pay | Admitting: *Deleted

## 2023-12-20 NOTE — Telephone Encounter (Signed)
 Pt has been scheduled tele preop appt 12/27/23. Med rec and consent are done.     Patient Consent for Virtual Visit        Rachel Bates has provided verbal consent on 12/20/2023 for a virtual visit (video or telephone).   CONSENT FOR VIRTUAL VISIT FOR:  Rachel Bates  By participating in this virtual visit I agree to the following:  I hereby voluntarily request, consent and authorize [facility] and its employed or contracted physicians, physician assistants, nurse practitioners or other licensed health care professionals (the Practitioner), to provide me with telemedicine health care services (the "Services) as deemed necessary by the treating Practitioner. I acknowledge and consent to receive the Services by the Practitioner via telemedicine. I understand that the telemedicine visit will involve communicating with the Practitioner through live audiovisual communication technology and the disclosure of certain medical information by electronic transmission. I acknowledge that I have been given the opportunity to request an in-person assessment or other available alternative prior to the telemedicine visit and am voluntarily participating in the telemedicine visit.  I understand that I have the right to withhold or withdraw my consent to the use of telemedicine in the course of my care at any time, without affecting my right to future care or treatment, and that the Practitioner or I may terminate the telemedicine visit at any time. I understand that I have the right to inspect all information obtained and/or recorded in the course of the telemedicine visit and may receive copies of available information for a reasonable fee.  I understand that some of the potential risks of receiving the Services via telemedicine include:  Delay or interruption in medical evaluation due to technological equipment failure or disruption; Information transmitted may not be sufficient (e.g. poor  resolution of images) to allow for appropriate medical decision making by the Practitioner; and/or  In rare instances, security protocols could fail, causing a breach of personal health information.  Furthermore, I acknowledge that it is my responsibility to provide information about my medical history, conditions and care that is complete and accurate to the best of my ability. I acknowledge that Practitioner's advice, recommendations, and/or decision may be based on factors not within their control, such as incomplete or inaccurate data provided by me or distortions of diagnostic images or specimens that may result from electronic transmissions. I understand that the practice of medicine is not an exact science and that Practitioner makes no warranties or guarantees regarding treatment outcomes. I acknowledge that a copy of this consent can be made available to me via my patient portal [facility] MyChart), or I can request a printed copy by calling the office of [facility].    I understand that my insurance will be billed for this visit.   I have read or had this consent read to me. I understand the contents of this consent, which adequately explains the benefits and risks of the Services being provided via telemedicine.  I have been provided ample opportunity to ask questions regarding this consent and the Services and have had my questions answered to my satisfaction. I give my informed consent for the services to be provided through the use of telemedicine in my medical care

## 2023-12-20 NOTE — Telephone Encounter (Signed)
 Patient with diagnosis of atrial fibrillation and VTE on Eliquis  for anticoagulation.    Procedure:  Carpal Tunnel Release   Date of Surgery:  Clearance TBD    CHA2DS2-VASc Score = 6   This indicates a 9.7% annual risk of stroke. The patient's score is based upon: CHF History: 1 HTN History: 1 Diabetes History: 0 Stroke History: 0 Vascular Disease History: 1 Age Score: 2 Gender Score: 1   Per chart had PE 2012, DVT prior to that  CrCl 80 Platelet count 95  Patient has not had an Afib/aflutter ablation within the last 3 months or DCCV within the last 30 days  Per office protocol, patient can hold Eliquis  for 1 days prior to procedure.   Patient will not need bridging with Lovenox  (enoxaparin ) around procedure.  **This guidance is not considered finalized until pre-operative APP has relayed final recommendations.**

## 2023-12-20 NOTE — Telephone Encounter (Signed)
 Pt has been scheduled tele preop appt 12/27/23. Med rec and consent are done.

## 2023-12-20 NOTE — Telephone Encounter (Signed)
 Primary Cardiologist:Gregg Carolynne Citron, MD   Preoperative team, please contact this patient and set up a phone call appointment for further preoperative risk assessment. Please obtain consent and complete medication review. Thank you for your help.   I confirm that guidance regarding antiplatelet and oral anticoagulation therapy has been completed and, if necessary, noted below.  Per office protocol, patient can hold Eliquis  for 1 days prior to procedure.   Patient will not need bridging with Lovenox  (enoxaparin ) around procedure.  I also confirmed the patient resides in the state of [place] . As per [facility] Medical Board telemedicine laws, the patient must reside in the state in which the provider is licensed.   Gerldine Koch, NP-C  12/20/2023, 9:59 AM [address], [address] [place], [place] [address] Office [phone] Fax [phone]

## 2023-12-26 ENCOUNTER — Ambulatory Visit: Admitting: Physician Assistant

## 2023-12-26 ENCOUNTER — Other Ambulatory Visit

## 2023-12-26 ENCOUNTER — Ambulatory Visit

## 2023-12-26 NOTE — Progress Notes (Signed)
 Virtual Visit via Telephone Note   Because of LINNAEA AHN co-morbid illnesses, she is at least at moderate risk for complications without adequate follow up.  This format is felt to be most appropriate for this patient at this time.  Due to technical limitations with video connection (technology), today's appointment will be conducted as an audio only telehealth visit, and BLAKLEY MICHNA verbally agreed to proceed in this manner.   All issues noted in this document were discussed and addressed.  No physical exam could be performed with this format.  Evaluation Performed:  Preoperative cardiovascular risk assessment _____________   Date:  12/26/2023   Patient ID:  Rachel Bates, DOB December 09, 1945, MRN [id number] Patient Location:  Home Provider location:   Office  Primary Care Provider:  Venancio Pock, PA-C Primary Cardiologist:  Danelle Birmingham, MD  Chief Complaint / Patient Profile   78 y.o. y/o female with a h/o coronary artery disease, ischemic cardiomyopathy, PE, chronic systolic CHF, paroxysmal atrial fibrillation who is pending carpal tunnel release and presents today for telephonic preoperative cardiovascular risk assessment.  History of Present Illness    Rachel Bates is a 78 y.o. female who presents via audio/video conferencing for a telehealth visit today.  Pt was last seen in cardiology clinic on 07/27/2023 by Dr. Birmingham.  At that time TEQUITA MARRS was doing well .  The patient is now pending procedure as outlined above. Since her last visit, she remained stable from a cardiac standpoint.  Today she denies chest pain, shortness of breath, lower extremity edema, fatigue, palpitations, melena, hematuria, hemoptysis, diaphoresis, weakness, presyncope, syncope, orthopnea, and PND.   Past Medical History    Past Medical History:  Diagnosis Date   AICD (automatic cardioverter/defibrillator) present dual   [employer] ---  original placedment 2007/  generator change 2014  by dr danelle taylor   Anemia    Anticoagulant long-term use    eliquis    Anxiety    Arthralgia of multiple joints    Arthritis pain    Benign hypertensive heart disease    CAD (coronary artery disease) primary cardiologist-- dr danelle taylor   MI and 2 stents 1995 in [place]    Cancer [facility])    CHF NYHA class II, chronic, systolic (HCC)    followed by dr danelle taylor   Degenerative disc disease, lumbar    Depression    Dyslipidemia    History of basal cell carcinoma (BCC) excision    right ankle area s/p  excision in office 06/ 2019  in office   History of DVT of lower extremity yrs ago before 2012   History of MI (myocardial infarction) 1995  in [place]   History of pulmonary embolus (PE) 2012   History of ventricular tachycardia    Hyperlipidemia    Hypersomnia    Hypertension    Ischemic cardiomyopathy    Migraines    Myocardial infarction (HCC)    OA (osteoarthritis)    all over   PAF (paroxysmal atrial fibrillation) (HCC)    Pneumonia    02/2023   Primary localized osteoarthritis of right hip 12/18/2017   Primary localized osteoarthritis of right knee 05/28/2018   S/P coronary artery stent placement 1995   in [facility]   05-21-2018 per pt x2  stents in same coronary artery (unsure BM or DES)   Vitamin D  deficiency disease    Past Surgical History:  Procedure Laterality Date   BIOPSY  06/19/2022   Procedure: BIOPSY;  Surgeon: Elicia Claw, MD;  Location: [facility];  Service: Gastroenterology;;   BRONCHIAL BIOPSY  08/07/2023   Procedure: BRONCHIAL BIOPSIES;  Surgeon: Shelah Lamar RAMAN, MD;  Location: [facility] ENDOSCOPY;  Service: Pulmonary;;   BRONCHIAL BRUSHINGS  08/07/2023   Procedure: BRONCHIAL BRUSHINGS;  Surgeon: Shelah Lamar RAMAN, MD;  Location: [facility] ENDOSCOPY;  Service: Pulmonary;;   BRONCHIAL NEEDLE ASPIRATION BIOPSY  08/07/2023   Procedure: BRONCHIAL NEEDLE ASPIRATION BIOPSIES;  Surgeon: Shelah Lamar RAMAN, MD;  Location: [facility];  Service: Pulmonary;;   CARDIAC  DEFIBRILLATOR PLACEMENT  2007   CATARACT EXTRACTION, BILATERAL     COLONOSCOPY  04/14/2013   colonic polyp, status post polypectomy. Mild panocolonic diverticulosis. Small internal hemorrhoids   DILATION AND CURETTAGE OF UTERUS  yrs ago   DIRECT LARYNGOSCOPY Right 10/04/2022   Procedure: DIRECT LARYNGOSCOPY;  Surgeon: Llewellyn Gerard LABOR, DO;  Location: [facility] OR;  Service: ENT;  Laterality: Right;   ESOPHAGOGASTRODUODENOSCOPY (EGD) WITH PROPOFOL  N/A 06/19/2022   Procedure: ESOPHAGOGASTRODUODENOSCOPY (EGD) WITH PROPOFOL ;  Surgeon: Elicia Claw, MD;  Location: [facility];  Service: Gastroenterology;  Laterality: N/A;   FIDUCIAL MARKER PLACEMENT  08/07/2023   Procedure: FIDUCIAL MARKER PLACEMENT;  Surgeon: Shelah Lamar RAMAN, MD;  Location: [facility] ENDOSCOPY;  Service: Pulmonary;;   IMPACTION REMOVAL  06/19/2022   Procedure: IMPACTION REMOVAL;  Surgeon: Elicia Claw, MD;  Location: [facility] ENDOS[facility];  Service: Gastroenterology;;   IMPLANTABLE CARDIOVERTER DEFIBRILLATOR GENERATOR CHANGE N/A 02/04/2013   Procedure: IMPLANTABLE CARDIOVERTER DEFIBRILLATOR GENERATOR CHANGE;  Surgeon: Danelle LELON Birmingham, MD;  Location: [facility] C[facility] LAB;  Service: Cardiovascular;  Laterality: N/A;   IR IMAGING GUIDED PORT INSERTION  10/27/2022   LYMPH NODE BIOPSY Right 10/04/2022   Procedure: EXCISIONAL OF RIGHT DEEP CERVICAL LYMPH NODE;  Surgeon: Llewellyn Gerard LABOR, DO;  Location: [facility];  Service: ENT;  Laterality: Right;   SHOULDER ARTHROSCOPY Right 2015   TOTAL HIP ARTHROPLASTY Right 12/18/2017   Procedure: RIGHT TOTAL HIP ARTHROPLASTY;  Surgeon: Josefina Chew, MD;  Location: [facility];  Service: Orthopedics;  Laterality: Right;   TOTAL HIP ARTHROPLASTY Left 04/19/2021   Procedure: TOTAL HIP ARTHROPLASTY;  Surgeon: Josefina Chew, MD;  Location: [facility];  Service: Orthopedics;  Laterality: Left;   TOTAL KNEE ARTHROPLASTY Right 05/28/2018   Procedure: TOTAL KNEE ARTHROPLASTY;  Surgeon: Josefina Chew, MD;  Location: [facility];  Service:  Orthopedics;  Laterality: Right;  Adductor Block   TOTAL KNEE ARTHROPLASTY Left 08/12/2021   Procedure: TOTAL KNEE ARTHROPLASTY;  Surgeon: Josefina Chew, MD;  Location: [facility];  Service: Orthopedics;  Laterality: Left;   TUBAL LIGATION Bilateral yrs ago   VIDEO BRONCHOSCOPY WITH RADIAL ENDOBRONCHIAL ULTRASOUND  08/07/2023   Procedure: VIDEO BRONCHOSCOPY WITH RADIAL ENDOBRONCHIAL ULTRASOUND;  Surgeon: Shelah Lamar RAMAN, MD;  Location: [facility];  Service: Pulmonary;;   WISDOM TOOTH EXTRACTION      Allergies  Allergies  Allergen Reactions   Sumatriptan Succinate Other (See Comments)    Chest pain, no triptans, pt states it makes my heart race   Amitriptyline Other (See Comments)    Weight gain    Home Medications    Prior to Admission medications   Medication Sig Start Date End Date Taking? Authorizing Provider  acetaminophen  (TYLENOL ) 650 MG CR tablet Take 1,300 mg by mouth every 8 (eight) hours as needed for pain.    [provider]  b complex vitamins capsule Take 1 capsule by mouth daily.    [provider]  Calcium  Carb-Cholecalciferol  (CALCIUM  600 + D PO) Take 1 tablet by  mouth daily.    [provider]  carvedilol  (COREG ) 12.5 MG tablet Take 12.5 mg by mouth 2 (two) times daily with a meal.     [provider]  Cholecalciferol  (VITAMIN D3) 125 MCG (5000 UT) capsule Take 5,000 Units by mouth daily.    [provider]  cyanocobalamin  (VITAMIN B12) 1000 MCG tablet Take 1,000 mcg by mouth daily.    [provider]  ELIQUIS  5 MG TABS tablet TAKE 1 TABLET BY MOUTH TWICE A DAY 10/19/23   Waddell Danelle ORN, MD  FIBER PO Take 1 capsule by mouth 2 (two) times daily.    [provider]  Fiber POWD Take 1 Scoop by mouth daily.    [provider]  gabapentin  (NEURONTIN ) 300 MG capsule Take 300 mg by mouth 3 (three) times daily. 04/09/20   [provider]  lidocaine  (XYLOCAINE ) 2 % solution Use as directed 15 mLs in  the mouth or throat as needed for mouth pain. 10/16/23   Onesimo Emaline Brink, MD  lidocaine -prilocaine  (EMLA ) cream Apply to affected area once 12/17/23   Kale, Gautam Kishore, MD  loratadine  (CLARITIN ) 10 MG tablet Take 10 mg by mouth daily.    [provider]  magic mouthwash w/lidocaine  SOLN Take 5 mLs by mouth 4 (four) times daily as needed for mouth pain. Suspension contains equal amounts of Maalox Extra Strength, nystatin , diphenhydramine  and lidocaine . 10/16/23   Onesimo Emaline Brink, MD  nystatin  (MYCOSTATIN ) 100000 UNIT/ML suspension Take 5 mLs (500,000 Units total) by mouth 4 (four) times daily. 10/16/23   Onesimo Emaline Brink, MD  Omega-3 Fatty Acids (FISH OIL) 1000 MG CAPS Take 1,000 mg by mouth daily.     [provider]  ondansetron  (ZOFRAN ) 8 MG tablet Take 1 tablet (8 mg total) by mouth every 8 (eight) hours as needed for nausea or vomiting. 12/17/23   Onesimo Emaline Brink, MD  prochlorperazine  (COMPAZINE ) 10 MG tablet Take 1 tablet (10 mg total) by mouth every 6 (six) hours as needed for nausea or vomiting. 12/17/23   Onesimo Emaline Brink, MD  ramipril  (ALTACE ) 2.5 MG tablet Take 2.5 mg by mouth daily.      [provider]  simvastatin  (ZOCOR ) 40 MG tablet Take 40 mg by mouth at bedtime.      [provider]  sucralfate  (CARAFATE ) 1 g tablet TAKE 1 TABLET (1 G TOTAL) BY MOUTH 4 (FOUR) TIMES DAILY - WITH MEALS AND AT BEDTIME. DISSOLVE TAB IN 5-10 ML OF WATER  TO MAKE A THICK SLURRY AND THEN SWALLOW IT. 11/07/23   Onesimo Emaline Brink, MD  tiZANidine  (ZANAFLEX ) 4 MG tablet Take 2 mg by mouth daily as needed for muscle spasms. 01/12/23   [provider]  traZODone  (DESYREL ) 50 MG tablet Take 50 mg by mouth at bedtime.     [provider]  venlafaxine  (EFFEXOR ) 75 MG tablet Take 75 mg by mouth daily. 03/06/21   [provider]  vitamin E  400 UNIT capsule Take 400 Units by mouth daily.    [provider]  zonisamide  (ZONEGRAN ) 50 MG  capsule Take 50-100 mg by mouth See admin instructions. Take 50mg  by mouth in the morning and 100mg  at night. 09/13/17   [provider]    Physical Exam    Vital Signs:  Devere MARLA Edu does not have vital signs available for review today.  Given telephonic nature of communication, physical exam is limited. AAOx3. NAD. Normal affect.  Speech and respirations are unlabored.  Accessory Clinical Findings    None  Assessment & Plan    1.  Preoperative Cardiovascular Risk Assessment:Procedure:  Carpal Tunnel Release   Date of Surgery:  Clearance TBD                                  Surgeon:  Dr. Fonda Olmsted Surgeon's Group or Practice Name:  [facility] Phone number:  [phone] Fax number:  [phone]    Primary Cardiologist: Danelle Birmingham, MD  Chart reviewed as part of pre-operative protocol coverage. Given past medical history and time since last visit, based on ACC/AHA guidelines, Ashtan K Poch would be at acceptable risk for the planned procedure without further cardiovascular testing.   Her RCRI is moderate risk, 6.6% risk of major cardiac event.  She is able to complete greater than 4 METS of physical activity.  Patient was advised that if she develops new symptoms prior to surgery to contact our office to arrange a follow-up appointment.  He verbalized understanding.  Per office protocol, patient can hold Eliquis  for 1 day prior to her procedure.  Please resume as soon as hemostasis is achieved.  She will not need Lovenox  bridging surrounding procedure.        I will route this recommendation to the requesting party via Epic fax function and remove from pre-op pool.      Time:   Today, I have spent 7 minutes with the patient with telehealth technology discussing medical history, symptoms, and management plan.  I spent 10 minutes reviewing patient's past cardiac history and cardiac medications.    Josefa CHRISTELLA Beauvais, NP  12/26/2023, 12:48  PM

## 2023-12-27 ENCOUNTER — Ambulatory Visit: Attending: Cardiovascular Disease

## 2023-12-27 DIAGNOSIS — Z01810 Encounter for preprocedural cardiovascular examination: Secondary | ICD-10-CM

## 2023-12-31 ENCOUNTER — Ambulatory Visit (HOSPITAL_COMMUNITY)
Admission: RE | Admit: 2023-12-31 | Discharge: 2023-12-31 | Disposition: A | Discharge status: Home / Self Care | Source: Ambulatory Visit | Attending: Hematology | Admitting: Hematology

## 2023-12-31 DIAGNOSIS — C3491 Malignant neoplasm of unspecified part of right bronchus or lung: Secondary | ICD-10-CM

## 2023-12-31 DIAGNOSIS — C3490 Malignant neoplasm of unspecified part of unspecified bronchus or lung: Secondary | ICD-10-CM | POA: Diagnosis not present

## 2023-12-31 DIAGNOSIS — C3411 Malignant neoplasm of upper lobe, right bronchus or lung: Secondary | ICD-10-CM | POA: Diagnosis not present

## 2023-12-31 LAB — GLUCOSE, CAPILLARY: Glucose-Capillary: 104 mg/dL — ABNORMAL HIGH (ref 70–99)

## 2023-12-31 MED ORDER — IOHEXOL 300 MG/ML  SOLN
100.0000 mL | Freq: Once | INTRAMUSCULAR | Status: AC | PRN
Start: 2023-12-31 — End: 2023-12-31
  Administered 2023-12-31: 75 mL via INTRAVENOUS

## 2023-12-31 MED ORDER — FLUDEOXYGLUCOSE F - 18 (FDG) INJECTION
7.6500 | Freq: Once | INTRAVENOUS | Status: AC | PRN
  Administered 2023-12-31: 7.65 via INTRAVENOUS

## 2024-01-02 ENCOUNTER — Inpatient Hospital Stay: Admitting: Physician Assistant

## 2024-01-02 ENCOUNTER — Inpatient Hospital Stay

## 2024-01-02 VITALS — BP 113/61 | HR 78 | Temp 98.3°F | Resp 18 | Ht 66.73 in | Wt 154.1 lb

## 2024-01-02 DIAGNOSIS — Z7189 Other specified counseling: Secondary | ICD-10-CM | POA: Diagnosis not present

## 2024-01-02 DIAGNOSIS — C3491 Malignant neoplasm of unspecified part of right bronchus or lung: Secondary | ICD-10-CM

## 2024-01-02 DIAGNOSIS — C3411 Malignant neoplasm of upper lobe, right bronchus or lung: Secondary | ICD-10-CM | POA: Diagnosis not present

## 2024-01-02 DIAGNOSIS — Z95828 Presence of other vascular implants and grafts: Secondary | ICD-10-CM

## 2024-01-02 DIAGNOSIS — Z5112 Encounter for antineoplastic immunotherapy: Secondary | ICD-10-CM | POA: Diagnosis not present

## 2024-01-02 DIAGNOSIS — Z79620 Long term (current) use of immunosuppressive biologic: Secondary | ICD-10-CM | POA: Diagnosis not present

## 2024-01-02 DIAGNOSIS — Z87891 Personal history of nicotine dependence: Secondary | ICD-10-CM | POA: Diagnosis not present

## 2024-01-02 LAB — CBC WITH DIFFERENTIAL (CANCER CENTER ONLY)
Abs Immature Granulocytes: 0.01 10*3/uL (ref 0.00–0.07)
Basophils Absolute: 0 10*3/uL (ref 0.0–0.1)
Basophils Relative: 0 %
Eosinophils Absolute: 0 10*3/uL (ref 0.0–0.5)
Eosinophils Relative: 1 %
HCT: 37.5 % (ref 36.0–46.0)
Hemoglobin: 12.9 g/dL (ref 12.0–15.0)
Immature Granulocytes: 0 %
Lymphocytes Relative: 13 %
Lymphs Abs: 0.5 10*3/uL — ABNORMAL LOW (ref 0.7–4.0)
MCH: 31.5 pg (ref 26.0–34.0)
MCHC: 34.4 g/dL (ref 30.0–36.0)
MCV: 91.7 fL (ref 80.0–100.0)
Monocytes Absolute: 0.4 10*3/uL (ref 0.1–1.0)
Monocytes Relative: 11 %
Neutro Abs: 2.7 10*3/uL (ref 1.7–7.7)
Neutrophils Relative %: 75 %
Platelet Count: 85 10*3/uL — ABNORMAL LOW (ref 150–400)
RBC: 4.09 MIL/uL (ref 3.87–5.11)
RDW: 13.1 % (ref 11.5–15.5)
WBC Count: 3.6 10*3/uL — ABNORMAL LOW (ref 4.0–10.5)
nRBC: 0 % (ref 0.0–0.2)

## 2024-01-02 LAB — CMP (CANCER CENTER ONLY)
ALT: 14 U/L (ref 0–44)
AST: 20 U/L (ref 15–41)
Albumin: 4.1 g/dL (ref 3.5–5.0)
Alkaline Phosphatase: 71 U/L (ref 38–126)
Anion gap: 5 (ref 5–15)
BUN: 11 mg/dL (ref 8–23)
CO2: 27 mmol/L (ref 22–32)
Calcium: 9.3 mg/dL (ref 8.9–10.3)
Chloride: 108 mmol/L (ref 98–111)
Creatinine: 0.55 mg/dL (ref 0.44–1.00)
GFR, Estimated: >60 mL/min (ref >60)
Glucose, Bld: 95 mg/dL (ref 70–99)
Potassium: 3.9 mmol/L (ref 3.5–5.1)
Sodium: 140 mmol/L (ref 135–145)
Total Bilirubin: 0.5 mg/dL (ref 0.0–1.2)
Total Protein: 6.4 g/dL — ABNORMAL LOW (ref 6.5–8.1)

## 2024-01-02 LAB — TSH: TSH: 1.86 u[IU]/mL (ref 0.350–4.500)

## 2024-01-02 MED ORDER — HEPARIN SOD (PORK) LOCK FLUSH 100 UNIT/ML IV SOLN
500.0000 [IU] | Freq: Once | INTRAVENOUS | Status: AC | PRN
  Administered 2024-01-02: 500 [IU]

## 2024-01-02 MED ORDER — SODIUM CHLORIDE 0.9 % IV SOLN
10.0000 mg/kg | Freq: Once | INTRAVENOUS | Status: AC
  Administered 2024-01-02: 740 mg via INTRAVENOUS
  Filled 2024-01-02: qty 4.8

## 2024-01-02 MED ORDER — SODIUM CHLORIDE 0.9% FLUSH
10.0000 mL | INTRAVENOUS | Status: DC | PRN
Start: 2024-01-02 — End: 2024-01-02
  Administered 2024-01-02: 10 mL

## 2024-01-02 MED ORDER — SODIUM CHLORIDE 0.9% FLUSH
10.0000 mL | Freq: Once | INTRAVENOUS | Status: AC
  Administered 2024-01-02: 10 mL

## 2024-01-02 MED ORDER — SODIUM CHLORIDE 0.9 % IV SOLN: INTRAVENOUS | Status: DC

## 2024-01-02 NOTE — Progress Notes (Signed)
 HEMATOLOGY/ONCOLOGY CLINIC NOTE  Date of Service: 01/02/2024  Patient Care Team: Venancio Pock, PA-C as PCP - General (Internal Medicine) Waddell Danelle ORN, MD as PCP - Cardiology (Cardiology) Harrietta Avelina LABOR, MD (Family Medicine) Onesimo Emaline Brink, MD as Consulting Physician (Hematology)  CHIEF COMPLAINTS/PURPOSE OF CONSULTATION:  Squamous cell carcinoma of the right lung History of DLBCL   INTERVAL HISTORY:  Rachel Bates is a 78 y.o. female who presents today for follow up and management of squamous cell carcinoma of the lung. She was last seen on 12/10/2023 by Dr. Onesimo. In the interim, she denies any changes to her health. She is unaccompanied for this visit.   Ms. Sobh reports her sore throat has nearly resolved since completing radiation therapy. Her energy and appetite are overall improving as well. She is able to complete her ADLs on her own. She denies nausea, vomiting or bowel habit changes. She denies easy bruising or signs of bleeding. She denies fevers, chills, sweats, shortness of breath, chest pain or cough. She has no other complaints.   MEDICAL HISTORY:  Past Medical History:  Diagnosis Date   AICD (automatic cardioverter/defibrillator) present dual   [employer] ---  original placedment 2007/  generator change 2014 by dr danelle taylor   Anemia    Anticoagulant long-term use    eliquis    Anxiety    Arthralgia of multiple joints    Arthritis pain    Benign hypertensive heart disease    CAD (coronary artery disease) primary cardiologist-- dr danelle taylor   MI and 2 stents 1995 in [place]    Cancer [facility])    CHF NYHA class II, chronic, systolic (HCC)    followed by dr danelle taylor   Degenerative disc disease, lumbar    Depression    Dyslipidemia    History of basal cell carcinoma (BCC) excision    right ankle area s/p  excision in office 06/ 2019  in office   History of DVT of lower extremity yrs ago before 2012   History of MI (myocardial  infarction) 1995  in [place]   History of pulmonary embolus (PE) 2012   History of ventricular tachycardia    Hyperlipidemia    Hypersomnia    Hypertension    Ischemic cardiomyopathy    Migraines    Myocardial infarction (HCC)    OA (osteoarthritis)    all over   PAF (paroxysmal atrial fibrillation) (HCC)    Pneumonia    02/2023   Primary localized osteoarthritis of right hip 12/18/2017   Primary localized osteoarthritis of right knee 05/28/2018   S/P coronary artery stent placement 1995   in [facility]   05-21-2018 per pt x2  stents in same coronary artery (unsure BM or DES)   Vitamin D  deficiency disease     SURGICAL HISTORY: Past Surgical History:  Procedure Laterality Date   BIOPSY  06/19/2022   Procedure: BIOPSY;  Surgeon: Elicia Claw, MD;  Location: [facility] ENDOSCOPY;  Service: Gastroenterology;;   BRONCHIAL BIOPSY  08/07/2023   Procedure: BRONCHIAL BIOPSIES;  Surgeon: Shelah Lamar RAMAN, MD;  Location: [facility] ENDOSCOPY;  Service: Pulmonary;;   BRONCHIAL BRUSHINGS  08/07/2023   Procedure: BRONCHIAL BRUSHINGS;  Surgeon: Shelah Lamar RAMAN, MD;  Location: [facility] ENDOSCOPY;  Service: Pulmonary;;   BRONCHIAL NEEDLE ASPIRATION BIOPSY  08/07/2023   Procedure: BRONCHIAL NEEDLE ASPIRATION BIOPSIES;  Surgeon: Shelah Lamar RAMAN, MD;  Location: [facility] ENDOSCOPY;  Service: Pulmonary;;   CARDIAC DEFIBRILLATOR PLACEMENT  2007   CATARACT EXTRACTION, BILATERAL  COLONOSCOPY  04/14/2013   colonic polyp, status post polypectomy. Mild panocolonic diverticulosis. Small internal hemorrhoids   DILATION AND CURETTAGE OF UTERUS  yrs ago   DIRECT LARYNGOSCOPY Right 10/04/2022   Procedure: DIRECT LARYNGOSCOPY;  Surgeon: Llewellyn Gerard LABOR, DO;  Location: [facility] OR;  Service: ENT;  Laterality: Right;   ESOPHAGOGASTRODUODENOSCOPY (EGD) WITH PROPOFOL  N/A 06/19/2022   Procedure: ESOPHAGOGASTRODUODENOSCOPY (EGD) WITH PROPOFOL ;  Surgeon: Elicia Claw, MD;  Location: [facility] ENDOSCOPY;  Service: Gastroenterology;  Laterality: N/A;    FIDUCIAL MARKER PLACEMENT  08/07/2023   Procedure: FIDUCIAL MARKER PLACEMENT;  Surgeon: Shelah Lamar RAMAN, MD;  Location: [facility] ENDOSCOPY;  Service: Pulmonary;;   IMPACTION REMOVAL  06/19/2022   Procedure: IMPACTION REMOVAL;  Surgeon: Elicia Claw, MD;  Location: [facility] ENDOSCOPY;  Service: Gastroenterology;;   IMPLANTABLE CARDIOVERTER DEFIBRILLATOR GENERATOR CHANGE N/A 02/04/2013   Procedure: IMPLANTABLE CARDIOVERTER DEFIBRILLATOR GENERATOR CHANGE;  Surgeon: Danelle LELON Birmingham, MD;  Location: [facility] CATH LAB;  Service: Cardiovascular;  Laterality: N/A;   IR IMAGING GUIDED PORT INSERTION  10/27/2022   LYMPH NODE BIOPSY Right 10/04/2022   Procedure: EXCISIONAL OF RIGHT DEEP CERVICAL LYMPH NODE;  Surgeon: Llewellyn Gerard LABOR, DO;  Location: [facility] OR;  Service: ENT;  Laterality: Right;   SHOULDER ARTHROSCOPY Right 2015   TOTAL HIP ARTHROPLASTY Right 12/18/2017   Procedure: RIGHT TOTAL HIP ARTHROPLASTY;  Surgeon: Josefina Chew, MD;  Location: [facility] OR;  Service: Orthopedics;  Laterality: Right;   TOTAL HIP ARTHROPLASTY Left 04/19/2021   Procedure: TOTAL HIP ARTHROPLASTY;  Surgeon: Josefina Chew, MD;  Location: [facility] ORS;  Service: Orthopedics;  Laterality: Left;   TOTAL KNEE ARTHROPLASTY Right 05/28/2018   Procedure: TOTAL KNEE ARTHROPLASTY;  Surgeon: Josefina Chew, MD;  Location: [facility] ORS;  Service: Orthopedics;  Laterality: Right;  Adductor Block   TOTAL KNEE ARTHROPLASTY Left 08/12/2021   Procedure: TOTAL KNEE ARTHROPLASTY;  Surgeon: Josefina Chew, MD;  Location: [facility] ORS;  Service: Orthopedics;  Laterality: Left;   TUBAL LIGATION Bilateral yrs ago   VIDEO BRONCHOSCOPY WITH RADIAL ENDOBRONCHIAL ULTRASOUND  08/07/2023   Procedure: VIDEO BRONCHOSCOPY WITH RADIAL ENDOBRONCHIAL ULTRASOUND;  Surgeon: Shelah Lamar RAMAN, MD;  Location: [facility] ENDOSCOPY;  Service: Pulmonary;;   WISDOM TOOTH EXTRACTION      SOCIAL HISTORY: Social History   Socioeconomic History   Marital status: Single    Spouse name: Not on file   Number of  children: 0   Years of education: 14   Highest education level: Not on file  Occupational History   Not on file  Tobacco Use   Smoking status: Former    Current packs/day: 0.00    Types: Cigarettes    Start date: 05/21/1966    Quit date: 05/21/1996    Years since quitting: 27.6   Smokeless tobacco: Never  Vaping Use   Vaping status: Never Used  Substance and Sexual Activity   Alcohol use: No   Drug use: Never   Sexual activity: Not on file  Other Topics Concern   Not on file  Social History Narrative   Not on file   Social Drivers of Health   Financial Resource Strain: Low Risk  (05/28/2018)   Overall Financial Resource Strain (CARDIA)    Difficulty of Paying Living Expenses: Not hard at all  Food Insecurity: No Food Insecurity (08/28/2023)   Hunger Vital Sign    Worried About Running Out of Food in the Last Year: Never true    Ran Out of Food in the Last Year: Never true  Transportation Needs: No  Transportation Needs (08/28/2023)   PRAPARE - Administrator, Civil Service (Medical): No    Lack of Transportation (Non-Medical): No  Physical Activity: Not on file  Stress: Not on file  Social Connections: Not on file  Intimate Partner Violence: Not At Risk (08/28/2023)   Humiliation, Afraid, Rape, and Kick questionnaire    Fear of Current or Ex-Partner: No    Emotionally Abused: No    Physically Abused: No    Sexually Abused: No    FAMILY HISTORY: Family History  Problem Relation Age of Onset   Hypertension Mother    Thyroid  disease Mother    Alzheimer's disease Mother    Coronary artery disease Father    Pulmonary embolism Father    Congestive Heart Failure Maternal Grandmother    Hypertension Maternal Grandmother    Heart attack Maternal Grandfather    Other Maternal Grandfather        carotid disease   Dementia Paternal Grandmother    Other Paternal Grandfather 81       accident    ALLERGIES:  is allergic to sumatriptan succinate and  amitriptyline.  MEDICATIONS:  Current Outpatient Medications  Medication Sig Dispense Refill   acetaminophen  (TYLENOL ) 650 MG CR tablet Take 1,300 mg by mouth every 8 (eight) hours as needed for pain.     b complex vitamins capsule Take 1 capsule by mouth daily.     Calcium  Carb-Cholecalciferol  (CALCIUM  600 + D PO) Take 1 tablet by mouth daily.     carvedilol  (COREG ) 12.5 MG tablet Take 12.5 mg by mouth 2 (two) times daily with a meal.      Cholecalciferol  (VITAMIN D3) 125 MCG (5000 UT) capsule Take 5,000 Units by mouth daily.     cyanocobalamin  (VITAMIN B12) 1000 MCG tablet Take 1,000 mcg by mouth daily.     ELIQUIS  5 MG TABS tablet TAKE 1 TABLET BY MOUTH TWICE A DAY 60 tablet 5   FIBER PO Take 1 capsule by mouth 2 (two) times daily.     Fiber POWD Take 1 Scoop by mouth daily.     gabapentin  (NEURONTIN ) 300 MG capsule Take 300 mg by mouth 3 (three) times daily.     lidocaine  (XYLOCAINE ) 2 % solution Use as directed 15 mLs in the mouth or throat as needed for mouth pain. 100 mL 0   lidocaine -prilocaine  (EMLA ) cream Apply to affected area once 30 g 3   loratadine  (CLARITIN ) 10 MG tablet Take 10 mg by mouth daily.     magic mouthwash w/lidocaine  SOLN Take 5 mLs by mouth 4 (four) times daily as needed for mouth pain. Suspension contains equal amounts of Maalox Extra Strength, nystatin , diphenhydramine  and lidocaine . 400 mL 0   nystatin  (MYCOSTATIN ) 100000 UNIT/ML suspension Take 5 mLs (500,000 Units total) by mouth 4 (four) times daily. 100 mL 0   Omega-3 Fatty Acids (FISH OIL) 1000 MG CAPS Take 1,000 mg by mouth daily.      ondansetron  (ZOFRAN ) 8 MG tablet Take 1 tablet (8 mg total) by mouth every 8 (eight) hours as needed for nausea or vomiting. 30 tablet 1   prochlorperazine  (COMPAZINE ) 10 MG tablet Take 1 tablet (10 mg total) by mouth every 6 (six) hours as needed for nausea or vomiting. 30 tablet 1   ramipril  (ALTACE ) 2.5 MG tablet Take 2.5 mg by mouth daily.       simvastatin  (ZOCOR ) 40 MG  tablet Take 40 mg by mouth at bedtime.  sucralfate  (CARAFATE ) 1 g tablet TAKE 1 TABLET (1 G TOTAL) BY MOUTH 4 (FOUR) TIMES DAILY - WITH MEALS AND AT BEDTIME. DISSOLVE TAB IN 5-10 ML OF WATER  TO MAKE A THICK SLURRY AND THEN SWALLOW IT. 270 tablet 1   tiZANidine  (ZANAFLEX ) 4 MG tablet Take 2 mg by mouth daily as needed for muscle spasms.     traZODone  (DESYREL ) 50 MG tablet Take 50 mg by mouth at bedtime.      venlafaxine  (EFFEXOR ) 75 MG tablet Take 75 mg by mouth daily.     vitamin E  400 UNIT capsule Take 400 Units by mouth daily.     zonisamide  (ZONEGRAN ) 50 MG capsule Take 50-100 mg by mouth See admin instructions. Take 50mg  by mouth in the morning and 100mg  at night.     No current facility-administered medications for this visit.   Facility-Administered Medications Ordered in Other Visits  Medication Dose Route Frequency Provider Last Rate Last Admin   0.9 %  sodium chloride  infusion   Intravenous Continuous Kale, Gautam Kishore, MD 10 mL/hr at 01/02/24 1424 New Bag at 01/02/24 1424   durvalumab (IMFINZI) 740 mg in sodium chloride  0.9 % 100 mL chemo infusion  10 mg/kg (Treatment Plan Recorded) Intravenous Once Kale, Gautam Kishore, MD       heparin  lock flush 100 unit/mL  500 Units Intracatheter Once PRN Kale, Gautam Kishore, MD       sodium chloride  flush (NS) 0.9 % injection 10 mL  10 mL Intracatheter PRN Onesimo Emaline Brink, MD        REVIEW OF SYSTEMS:    10 Point review of Systems was done is negative except as noted above.   PHYSICAL EXAMINATION: .BP 113/61 (BP Location: Right Arm, Patient Position: Sitting)   Pulse 78   Temp 98.3 F (36.8 C) (Tympanic)   Resp 18   Ht 5' 6.73 (1.695 m)   Wt 154 lb 1.6 oz (69.9 kg)   SpO2 98%   BMI 24.33 kg/m    GENERAL:alert, in no acute distress and comfortable SKIN: no acute rashes, no significant lesions EYES: conjunctiva are pink and non-injected, sclera anicteric LUNGS: clear to auscultation b/l with normal respiratory  effort HEART: regular rate & rhythm Extremity: no pedal edema PSYCH: alert & oriented x 3 with fluent speech NEURO: no focal motor/sensory deficits   LABORATORY DATA:  I have reviewed the data as listed    Latest Ref Rng & Units 01/02/2024   12:50 PM 12/10/2023   10:55 AM 10/29/2023   11:09 AM  CBC  WBC 4.0 - 10.5 K/uL 3.6  3.0  1.6   Hemoglobin 12.0 - 15.0 g/dL 87.0  86.9  88.2   Hematocrit 36.0 - 46.0 % 37.5  37.3  34.5   Platelets 150 - 400 K/uL 85  95  99       Latest Ref Rng & Units 01/02/2024   12:50 PM 12/10/2023   10:55 AM 10/29/2023   11:09 AM  CMP  Glucose 70 - 99 mg/dL 95  895  892   BUN 8 - 23 mg/dL 11  8  11    Creatinine 0.44 - 1.00 mg/dL 9.44  9.35  9.13   Sodium 135 - 145 mmol/L 140  141  139   Potassium 3.5 - 5.1 mmol/L 3.9  3.9  4.2   Chloride 98 - 111 mmol/L 108  108  108   CO2 22 - 32 mmol/L 27  28  25    Calcium  8.9 -  10.3 mg/dL 9.3  9.2  9.0   Total Protein 6.5 - 8.1 g/dL 6.4  6.5  6.4   Total Bilirubin 0.0 - 1.2 mg/dL 0.5  0.5  0.5   Alkaline Phos 38 - 126 U/L 71  77  70   AST 15 - 41 U/L 20  17  19    ALT 0 - 44 U/L 14  13  15     Lab Results  Component Value Date   LDH 166 12/10/2023   Molecular Pathology 10/21/2022:    Surgical Pathology 10/04/22: A. LYMPH NODE, RIGHT LEVEL 2 DEEP CERVICAL, EXCISION:  -Diffuse large B-cell lymphoma  -See comment COMMENT: The sections show diffuse effacement of the lymph nodal architecture primarily by a population of large lymphoid cells characterized by vesicular chromatin and small nucleoli associated with apoptosis and brisk mitosis.  In some areas, the large atypical lymphoid proliferation extends into the perinodal adipose tissue.  In this background, there are scattered variably sized and somewhat disrupted aggregates of primarily small lymphoid cells characterized by high nuclear cytoplasmic ratio, angulated nuclear contours and small to inconspicuous nucleoli. Flow cytometric analysis was performed [facility]  [phone]) and shows a monoclonal, lambda restricted B-cell population expressing CD10.  In addition, immunohistochemical stains for CD3, CD5, CD10, CD20, PAX5, BCL6, Bcl-2, Ki-67, CD30, CD138, CD21, EBV in addition to in situ hybridization for kappa and lambda were performed with appropriate controls.  The large lymphoid cells are positive for CD20, PAX5, CD10, BCL6, Bcl-2, and partially for cytoplasmic lambda.  No significant staining is seen with EBV, CD30, CD138 or cytoplasmic kappa.  Ki67 shows variably increased expression (more than 50% in some areas). CD21 highlights scattered somewhat disrupted follicular dendritic networks. The lymphoid aggregates of primarily small lymphoid cells previously described show positivity for B-cell markers CD20 and PAX5 in addition to CD10 and Bcl-2.  There is an admixed variable T-cell component in the background as seen with CD3 and CD5 and there is no apparent co-expression of CD5 in B-cell areas.  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    RADIOGRAPHIC STUDIES: I have personally reviewed the radiological images as listed and agreed with the findings in the report. CT HEAD W & WO CONTRAST ( ) Result Date: 01/01/2024 CLINICAL DATA:  Non-small cell lung cancer with frontal bone lesion. Interval rib evaluation. EXAM: CT HEAD WITHOUT AND WITH CONTRAST TECHNIQUE: Contiguous axial images were obtained from the base of the skull through the vertex without and with intravenous contrast. RADIATION DOSE REDUCTION: This exam was performed according to the departmental dose-optimization program which includes automated exposure control, adjustment of the mA and/or kV according to patient size and/or use of iterative reconstruction technique. CONTRAST:  75mL OMNIPAQUE  IOHEXOL  300 MG/ML  SOLN COMPARISON:  09/03/2023 CT.   11/28/2010 CT. FINDINGS: Brain: Brain continues to have a normal appearance without evidence of accelerated atrophy, old or acute infarction, intracranial mass lesion, hemorrhage, hydrocephalus or extra-axial collection. After contrast administration, no abnormal enhancement occurs. Vascular: Minimal age related atherosclerotic calcification. Skull: Redemonstration of an area of architectural distortion in the left frontal bone immediately above the frontal sinus. There is no evidence of change or progression since the study of February. No second skull abnormality is seen. I agree that this was not present in 2012. If this does represent a bone metastasis, it is indolent/nonprogressive over this 4 month follow-up. There was no  hypermetabolism visible at this location at the time of the PET scan. Sinuses/Orbits: Clear/normal Other: None IMPRESSION: 1. No change since the study of February. No evidence of intracranial metastatic disease. 2. Redemonstration of an area of architectural distortion in the left frontal bone immediately above the frontal sinus. There is no evidence of change or progression since the study of February. No second skull abnormality is seen. I agree that this was not present in 2012. If this does represent a bone metastasis, it is indolent/nonprogressive over this 4 month follow-up. There was no hypermetabolism visible at this location at the time of the PET scan, and I do not think this is definitely a malignant lesion. Additional follow-up would seem prudent. Electronically Signed   By: Oneil Officer M.D.   On: 01/01/2024 16:34   NM PET Image Restag (PS) Skull Base To Thigh Result Date: 12/31/2023 CLINICAL DATA:  Subsequent treatment strategy for non-small-cell lung cancer. Assess treatment response. EXAM: NUCLEAR MEDICINE PET SKULL BASE TO THIGH TECHNIQUE: 7.65 mCi F-18 FDG was injected intravenously. Full-ring PET imaging was performed from the skull base to thigh after the radiotracer.  CT data was obtained and used for attenuation correction and anatomic localization. Fasting blood glucose: 104 mg/dl COMPARISON:  PET-CT 97/85/7974 and older. FINDINGS: Mediastinal blood pool activity: SUV max 2.8 Liver activity: SUV max 3.2 NECK: No specific abnormal uptake identified in the left neck. There are some small nodes identified in the low right neck just above the thoracic inlet, supraclavicular region example has maximum SUV of 6.8 along lymph node just posterior to the course of the port catheter supraclavicular. Trace uptake in this location previously. Node in this location previously would have measured 7 mm in short axis and today on image 42 16 mm. Just caudal and posterior to this, posterior to the right thyroid  lobe is also 1 lymph node with maximum SUV value of 11.9 and diameter on image 45 short axis of 11 mm. This is new from previous. Incidental CT findings: The parotid glands, submandibular glands and thyroid  gland are unremarkable. Visualized paranasal sinuses and mastoid air cells are clear. CHEST: Previously there is a right upper lobe lung nodule near the hilum which measured 2.2 by 1.5 cm with maximum SUV value of 8.5. Today this has maximum SUV of 6.6 and lesion on image 60 today measures 1.8 by 1.1 cm when measured in the same fashion. Previously there were several abnormal lymph nodes identified in the right hilum, mediastinum previous include right paratracheal and subcarinal. These nodes are again seen. The right hilar area which had maximum SUV of 10.1, today has maximum SUV value of 5.9. The subcarinal focus previously had maximum SUV value of 9.1 and today 5.5. However there are new right paratracheal nodes more superiorly. Example today has maximum SUV of 8.3 on image 53 with diameter approaching 12 mm. This is partially obscured by the artifact from the left upper chest defibrillator. In addition the left upper lobe nodular area which had maximum SUV value of 1.9 and a small  nodule measuring 8 mm, today shows a much larger nodule or cluster of nodules measuring in total up to 12 mm. Maximum SUV value today of 5.0. No additional abnormal areas of uptake in the axillary regions, left hilum or along the lung parenchyma. Incidental CT findings: Breathing motion. There is some dependent atelectasis. Chronic lung changes. Again streak artifact related to the left upper chest battery pack with leads along the right side of the heart.  There is a separate right IJ chest port with tip extending to the right atrium. Heart is nonenlarged. Slight increase in small pericardial fluid. Scattered vascular calcifications. The no pleural effusion. ABDOMEN/PELVIS: There is physiologic distribution radiotracer along the parenchymal organs, bowel and renal collecting systems. No specific abnormal nodal uptake. Incidental CT findings: There is some contrast in the renal collecting systems. Please correlate with previous contrast head CT. Scattered vascular calcifications are seen. Breathing motion. Grossly the liver, spleen, adrenal glands pancreas are unremarkable. The gallbladder is nondilated. Large bowel has a normal course and caliber. Sigmoid colon diverticula. Normal appendix. The stomach and small bowel are nondilated. There is significant streak artifact related to the patient's bowel hip arthroplasties obscuring portions of the pelvis. This includes bowel, bladder and adnexa. SKELETON: No specific abnormal uptake along the visualized osseous structures. Incidental CT findings: Curvature of the spine. Diffuse degenerative changes. Again fixation hardware about the hips. IMPRESSION: Previous right upper lobe perihilar hypermetabolic mass and the previously seen nodes in the right hilum in mid mediastinum appears smaller and show less uptake. However there are new hypermetabolic nodes more superiorly in the right side of the mediastinum including paratracheal region, thoracic inlet as well as in the  low right neck, supraclavicular. Left upper lobe nodular area with uptake is larger and shows more uptake today. Overall mixed response. No areas of abnormal uptake seen beneath the diaphragm. Electronically Signed   By: Ranell Bring M.D.   On: 12/31/2023 18:59   NCV with EMG(electromyography) Result Date: 12/07/2023 Tobie Tonita POUR, DO     12/07/2023  3:36 PM [facility] [address] [place], [address]  [place], [place] [address] Tel: [phone] Fax: [phone] Test Date:  12/07/2023 Patient: Netty Sullivant DOB: 12-03-1945 Physician: Tonita Tobie, DO Sex: Female Height: 5' 7 Ref Phys: MICAEL Toribio Silos, MD ID#: [id number]   Technician:  History: This is a 78 year old female referred for evaluation of right hand pain and numbness. NCV & EMG Findings: Extensive electrodiagnostic testing of the right upper extremity shows: Right median sensory response is absent.  Right ulnar sensory response is within normal limits. Right median motor response is absent.  Right ulnar motor response is within normal limits. No motor unit recruitment is seen in the right abductor pollicis brevis muscle.  There is no evidence of accompanying active denervation.  The remaining tested muscles show normal motor unit configuration and recruitment pattern. Impression: Right median neuropathy at or distal to the wrist, consistent with a clinical diagnosis of carpal tunnel syndrome.  Overall, these findings are very severe in degree electrically. ___________________________ Tonita Tobie, DO Nerve Conduction Studies  Stim Site NR Peak (ms) Norm Peak (ms) O-P Amp (V) Norm O-P Amp Right Median Anti Sensory (2nd Digit)  32 C Wrist *NR  <3.8  >10 Right Ulnar Anti Sensory (5th Digit)  32 C Wrist    2.7 <3.2 8.9 >5  Stim Site NR Onset (ms) Norm Onset (ms) O-P Amp (mV) Norm O-P Amp Site1 Site2 Delta-0 (ms) Dist (cm) Vel (m/s) Norm Vel (m/s) Right Median Motor (Abd Poll Brev)  32 C Wrist *NR  <4.0  >5 Elbow Wrist 0.0   >50 Elbow *NR            Right Ulnar Motor (Abd Dig Minimi)  32 C Wrist    1.6 <3.1 8.7 >7 B Elbow Wrist 3.8 22.0 58 >50 B Elbow    5.4  8.5  A Elbow B Elbow 1.9 10.0 53 >50  A Elbow    7.3  7.7        Electromyography  Side Muscle Ins.Act Fibs Fasc Recrt Amp Dur Poly Activation Comment Right 1stDorInt Nml Nml Nml Nml Nml Nml Nml Nml N/A Right Abd Poll Brev Nml Nml Nml *None *- *- *- Nml *ATR Right PronatorTeres Nml Nml Nml Nml Nml Nml Nml Nml N/A Right Biceps Nml Nml Nml Nml Nml Nml Nml Nml N/A Right Triceps Nml Nml Nml Nml Nml Nml Nml Nml N/A Right Deltoid Nml Nml Nml Nml Nml Nml Nml Nml N/A Waveforms:           NM PET 09/04/2022: IMPRESSION: 1. There is a large tracer avid mass centered within the left base of tongue and left lingual region which extends into the hypopharyngeal region ventral to the epiglottis. Imaging findings are compatible with a primary head and neck malignancy. 2. A smaller focus of increased uptake localizes to the right pharyngeal tonsil region. Indeterminate. 3. Bilateral tracer avid cervical and supraclavicular lymph nodes compatible with nodal metastasis. 4. There is a 7 mm tracer avid nodule within the anteromedial left upper lobe which is suspicious for pulmonary metastasis. 5. There is increased uptake identified along the long axis of the posterior right seventh rib. No corresponding lytic or sclerotic changes identified on the CT images. Cannot exclude bone metastases. 6. Multiple prominent retroperitoneal and mesenteric lymph nodes are identified which exhibit mild tracer uptake. This is a nonspecific finding and may reflect reactive adenopathy. Metastatic adenopathy cannot be excluded. Attention on future surveillance imaging is advised. 7.  Aortic Atherosclerosis (ICD10-I70.0).   ASSESSMENT & PLAN:  Rachel Bates is 78 y.o.  female who presents for a follow up for continued management of squamous cell carcinoma of the lung.   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 Completed radiation therapy on 11/02/2023.  PLAN: --Due to start Cycle 1, Day 1 of Durvalumab today.  --Labs from today were reviewed and adequate for treatment. WBC 3.6, Hgb 12.9, Plt 85K. Creatinine and LFTs are normal.  --CT Head from 12/31/2023 showed no intracranial metastatic disease --Recent PET scan from 12/31/2023 showed previous RUL perihilar mass and previously seen nodes in the right hilum in the mid mediastinum appears smaller and shows less uptake. There are new hypermetabolic nodes more superiorly in the right side of the mediastinum along with LUL nodular area with more uptake.  --Causes for new hypermetabolic areas including progression of disease versus reactive process secondary to radiation therapy. Will plan to obtain interval CT scan in 6-8 weeks to monitor.  --Proceed with treatment without any dose modifications --Plan to obtain Guardant 360 testing  --RTC in 3 weeks with labs and follow up prior  Cycle 2, Day 1 of Durvalumab.   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     All of the patient's questions were answered with apparent satisfaction. The patient knows to call the clinic with any problems, questions or concerns.   I have spent a total of 30 minutes minutes of face-to-face and non-face-to-face time, preparing to see the patient,performing a medically appropriate examination, counseling and educating the patient,  ordering medications/tests/procedures,  documenting clinical information in the electronic health  record, independently interpreting results and communicating results to the patient, and care coordination.   Johnston Police PA-C Dept of Hematology and Oncology [facility] Phone: [phone]

## 2024-01-02 NOTE — Patient Instructions (Signed)
 [facility]  Discharge Instructions: Thank you for choosing [facility] to provide your oncology and hematology care.   If you have a lab appointment with the Cancer Center, please go directly to the Cancer Center and check in at the registration area.   Wear comfortable clothing and clothing appropriate for easy access to any Portacath or PICC line.   We strive to give you quality time with your provider. You may need to reschedule your appointment if you arrive late (15 or more minutes).  Arriving late affects you and other patients whose appointments are after yours.  Also, if you miss three or more appointments without notifying the office, you may be dismissed from the clinic at the provider's discretion.      For prescription refill requests, have your pharmacy contact our office and allow 72 hours for refills to be completed.    Today you received the following chemotherapy and/or immunotherapy agents: Imfinzi      To help prevent nausea and vomiting after your treatment, we encourage you to take your nausea medication as directed.  BELOW ARE SYMPTOMS THAT SHOULD BE REPORTED IMMEDIATELY: *FEVER GREATER THAN 100.4 F (38 C) OR HIGHER *CHILLS OR SWEATING *NAUSEA AND VOMITING THAT IS NOT CONTROLLED WITH YOUR NAUSEA MEDICATION *UNUSUAL SHORTNESS OF BREATH *UNUSUAL BRUISING OR BLEEDING *URINARY PROBLEMS (pain or burning when urinating, or frequent urination) *BOWEL PROBLEMS (unusual diarrhea, constipation, pain near the anus) TENDERNESS IN MOUTH AND THROAT WITH OR WITHOUT PRESENCE OF ULCERS (sore throat, sores in mouth, or a toothache) UNUSUAL RASH, SWELLING OR PAIN  UNUSUAL VAGINAL DISCHARGE OR ITCHING   Items with * indicate a potential emergency and should be followed up as soon as possible or go to the Emergency Department if any problems should occur.  Please show the CHEMOTHERAPY ALERT CARD or IMMUNOTHERAPY  ALERT CARD at check-in to the Emergency Department and triage nurse.  Should you have questions after your visit or need to cancel or reschedule your appointment, please contact [facility]  Dept: [phone]  and follow the prompts.  Office hours are 8:00 a.m. to 4:30 p.m. Monday - Friday. Please note that voicemails left after 4:00 p.m. may not be returned until the following business day.  We are closed weekends and major holidays. You have access to a nurse at all times for urgent questions. Please call the main number to the clinic Dept: [phone] and follow the prompts.   For any non-urgent questions, you may also contact your provider using MyChart. We now offer e-Visits for anyone 15 and older to request care online for non-urgent symptoms. For details visit [email or web address].   Also download the MyChart app! Go to the app store, search "MyChart", open the app, select [place], and log in with your MyChart username and password.  Durvalumab Injection What is this medication? DURVALUMAB (dur VAL ue mab) treats some types of cancer. It works by helping your immune system slow or stop the spread of cancer cells. It is a monoclonal antibody. This medicine may be used for other purposes; ask your health care provider or pharmacist if you have questions. COMMON BRAND NAME(S): IMFINZI What should I tell my care team before I take this medication? They need to know if you have any of these conditions: Allogeneic stem cell transplant (uses someone else's stem cells) Autoimmune  diseases, such as Crohn disease, ulcerative colitis, lupus History of chest radiation Nervous system problems, such as Guillain-Barre syndrome, myasthenia gravis Organ transplant An unusual or allergic reaction to durvalumab, other medications, foods, dyes, or preservatives Pregnant or trying to get pregnant Breast-feeding How should I use this  medication? This medication is infused into a vein. It is given by your care team in a hospital or clinic setting. A special MedGuide will be given to you before each treatment. Be sure to read this information carefully each time. Talk to your care team about the use of this medication in children. Special care may be needed. Overdosage: If you think you have taken too much of this medicine contact a poison control center or emergency room at once. NOTE: This medicine is only for you. Do not share this medicine with others. What if I miss a dose? Keep appointments for follow-up doses. It is important not to miss your dose. Call your care team if you are unable to keep an appointment. What may interact with this medication? Interactions have not been studied. This list may not describe all possible interactions. Give your health care provider a list of all the medicines, herbs, non-prescription drugs, or dietary supplements you use. Also tell them if you smoke, drink alcohol, or use illegal drugs. Some items may interact with your medicine. What should I watch for while using this medication? Your condition will be monitored carefully while you are receiving this medication. You may need blood work while taking this medication. This medication may cause serious skin reactions. They can happen weeks to months after starting the medication. Contact your care team right away if you notice fevers or flu-like symptoms with a rash. The rash may be red or purple and then turn into blisters or peeling of the skin. You may also notice a red rash with swelling of the face, lips, or lymph nodes in your neck or under your arms. Tell your care team right away if you have any change in your eyesight. Talk to your care team if you may be pregnant. Serious birth defects can occur if you take this medication during pregnancy and for 3 months after the last dose. You will need a negative pregnancy test before starting  this medication. Contraception is recommended while taking this medication and for 3 months after the last dose. Your care team can help you find the option that works for you. Do not breastfeed while taking this medication and for 3 months after the last dose. What side effects may I notice from receiving this medication? Side effects that you should report to your care team as soon as possible: Allergic reactions--skin rash, itching, hives, swelling of the face, lips, tongue, or throat Dry cough, shortness of breath or trouble breathing Eye pain, redness, irritation, or discharge with blurry or decreased vision Heart muscle inflammation--unusual weakness or fatigue, shortness of breath, chest pain, fast or irregular heartbeat, dizziness, swelling of the ankles, feet, or hands Hormone gland problems--headache, sensitivity to light, unusual weakness or fatigue, dizziness, fast or irregular heartbeat, increased sensitivity to cold or heat, excessive sweating, constipation, hair loss, increased thirst or amount of urine, tremors or shaking, irritability Infusion reactions--chest pain, shortness of breath or trouble breathing, feeling faint or lightheaded Kidney injury (glomerulonephritis)--decrease in the amount of urine, red or dark brown urine, foamy or bubbly urine, swelling of the ankles, hands, or feet Liver injury--right upper belly pain, loss of appetite, nausea, light-colored stool, dark yellow or  brown urine, yellowing skin or eyes, unusual weakness or fatigue Pain, tingling, or numbness in the hands or feet, muscle weakness, change in vision, confusion or trouble speaking, loss of balance or coordination, trouble walking, seizures Rash, fever, and swollen lymph nodes Redness, blistering, peeling, or loosening of the skin, including inside the mouth Sudden or severe stomach pain, bloody diarrhea, fever, nausea, vomiting Side effects that usually do not require medical attention (report these  to your care team if they continue or are bothersome): Bone, joint, or muscle pain Diarrhea Fatigue Loss of appetite Nausea Skin rash This list may not describe all possible side effects. Call your doctor for medical advice about side effects. You may report side effects to FDA at 1-800-FDA-1088. Where should I keep my medication? This medication is given in a hospital or clinic. It will not be stored at home. NOTE: This sheet is a summary. It may not cover all possible information. If you have questions about this medicine, talk to your doctor, pharmacist, or health care provider.  2024 Elsevier/Gold Standard (2021-11-08 00:00:00)

## 2024-01-03 ENCOUNTER — Telehealth: Payer: Self-pay

## 2024-01-03 LAB — T4: T4, Total: 6.5 ug/dL (ref 4.5–12.0)

## 2024-01-03 NOTE — Telephone Encounter (Signed)
-----   Message from Nurse Rudell CROME sent at 01/02/2024  4:32 PM EDT ----- Regarding: Onesimo 1st infusion F/U call - Imfinzi Kale 1st infusion F/U call - Imfinzi.  Patient tolerated well.

## 2024-01-03 NOTE — Telephone Encounter (Signed)
 Rachel Bates  states that she is doing fine. She is eating, drinking, and urinating well. She knows to call the office at [phone] if  she has any questions or concerns.

## 2024-01-04 ENCOUNTER — Encounter: Payer: Self-pay | Admitting: Hematology

## 2024-01-04 ENCOUNTER — Other Ambulatory Visit: Payer: Self-pay | Admitting: Physician Assistant

## 2024-01-08 ENCOUNTER — Ambulatory Visit

## 2024-01-08 ENCOUNTER — Ambulatory Visit: Admitting: Hematology

## 2024-01-08 ENCOUNTER — Telehealth: Payer: Self-pay | Admitting: Hematology

## 2024-01-08 ENCOUNTER — Other Ambulatory Visit

## 2024-01-08 NOTE — Telephone Encounter (Signed)
 Scheduled appointments per WQ. Talked with the patient and she is aware of the made appointments.

## 2024-01-16 ENCOUNTER — Inpatient Hospital Stay

## 2024-01-16 ENCOUNTER — Inpatient Hospital Stay: Attending: Hematology

## 2024-01-16 VITALS — BP 108/62 | HR 79 | Temp 98.3°F | Resp 18 | Wt 155.5 lb

## 2024-01-16 DIAGNOSIS — Z79899 Other long term (current) drug therapy: Secondary | ICD-10-CM | POA: Diagnosis not present

## 2024-01-16 DIAGNOSIS — C3411 Malignant neoplasm of upper lobe, right bronchus or lung: Secondary | ICD-10-CM | POA: Insufficient documentation

## 2024-01-16 DIAGNOSIS — C3491 Malignant neoplasm of unspecified part of right bronchus or lung: Secondary | ICD-10-CM

## 2024-01-16 DIAGNOSIS — Z87891 Personal history of nicotine dependence: Secondary | ICD-10-CM | POA: Diagnosis not present

## 2024-01-16 DIAGNOSIS — Z5112 Encounter for antineoplastic immunotherapy: Secondary | ICD-10-CM | POA: Insufficient documentation

## 2024-01-16 DIAGNOSIS — Z7189 Other specified counseling: Secondary | ICD-10-CM

## 2024-01-16 DIAGNOSIS — Z95828 Presence of other vascular implants and grafts: Secondary | ICD-10-CM

## 2024-01-16 DIAGNOSIS — Z79620 Long term (current) use of immunosuppressive biologic: Secondary | ICD-10-CM | POA: Diagnosis not present

## 2024-01-16 LAB — CBC WITH DIFFERENTIAL (CANCER CENTER ONLY)
Abs Immature Granulocytes: 0.01 K/uL (ref 0.00–0.07)
Basophils Absolute: 0 K/uL (ref 0.0–0.1)
Basophils Relative: 0 %
Eosinophils Absolute: 0 K/uL (ref 0.0–0.5)
Eosinophils Relative: 1 %
HCT: 38.9 % (ref 36.0–46.0)
Hemoglobin: 13.4 g/dL (ref 12.0–15.0)
Immature Granulocytes: 0 %
Lymphocytes Relative: 12 %
Lymphs Abs: 0.5 K/uL — ABNORMAL LOW (ref 0.7–4.0)
MCH: 31.4 pg (ref 26.0–34.0)
MCHC: 34.4 g/dL (ref 30.0–36.0)
MCV: 91.1 fL (ref 80.0–100.0)
Monocytes Absolute: 0.4 K/uL (ref 0.1–1.0)
Monocytes Relative: 11 %
Neutro Abs: 2.9 K/uL (ref 1.7–7.7)
Neutrophils Relative %: 76 %
Platelet Count: 91 K/uL — ABNORMAL LOW (ref 150–400)
RBC: 4.27 MIL/uL (ref 3.87–5.11)
RDW: 13.2 % (ref 11.5–15.5)
WBC Count: 3.9 K/uL — ABNORMAL LOW (ref 4.0–10.5)
nRBC: 0 % (ref 0.0–0.2)

## 2024-01-16 LAB — CMP (CANCER CENTER ONLY)
ALT: 15 U/L (ref 0–44)
AST: 20 U/L (ref 15–41)
Albumin: 4 g/dL (ref 3.5–5.0)
Alkaline Phosphatase: 84 U/L (ref 38–126)
Anion gap: 6 (ref 5–15)
BUN: 13 mg/dL (ref 8–23)
CO2: 27 mmol/L (ref 22–32)
Calcium: 9.3 mg/dL (ref 8.9–10.3)
Chloride: 109 mmol/L (ref 98–111)
Creatinine: 0.55 mg/dL (ref 0.44–1.00)
GFR, Estimated: >60 mL/min (ref >60)
Glucose, Bld: 112 mg/dL — ABNORMAL HIGH (ref 70–99)
Potassium: 3.8 mmol/L (ref 3.5–5.1)
Sodium: 142 mmol/L (ref 135–145)
Total Bilirubin: 0.4 mg/dL (ref 0.0–1.2)
Total Protein: 6.4 g/dL — ABNORMAL LOW (ref 6.5–8.1)

## 2024-01-16 MED ORDER — SODIUM CHLORIDE 0.9 % IV SOLN
10.0000 mg/kg | Freq: Once | INTRAVENOUS | Status: AC
  Administered 2024-01-16: 740 mg via INTRAVENOUS
  Filled 2024-01-16: qty 4.8

## 2024-01-16 MED ORDER — SODIUM CHLORIDE 0.9% FLUSH
10.0000 mL | INTRAVENOUS | Status: DC | PRN
  Administered 2024-01-16: 10 mL

## 2024-01-16 MED ORDER — HEPARIN SOD (PORK) LOCK FLUSH 100 UNIT/ML IV SOLN
500.0000 [IU] | Freq: Once | INTRAVENOUS | Status: AC | PRN
  Administered 2024-01-16: 500 [IU]

## 2024-01-16 MED ORDER — SODIUM CHLORIDE 0.9 % IV SOLN: INTRAVENOUS | Status: DC

## 2024-01-16 MED ORDER — SODIUM CHLORIDE 0.9% FLUSH
10.0000 mL | Freq: Once | INTRAVENOUS | Status: AC
Start: 2024-01-16 — End: 2024-01-16
  Administered 2024-01-16: 10 mL

## 2024-01-16 NOTE — Patient Instructions (Signed)
 [facility]  Discharge Instructions: Thank you for choosing [facility] to provide your oncology and hematology care.   If you have a lab appointment with the Cancer Center, please go directly to the Cancer Center and check in at the registration area.   Wear comfortable clothing and clothing appropriate for easy access to any Portacath or PICC line.   We strive to give you quality time with your provider. You may need to reschedule your appointment if you arrive late (15 or more minutes).  Arriving late affects you and other patients whose appointments are after yours.  Also, if you miss three or more appointments without notifying the office, you may be dismissed from the clinic at the provider's discretion.      For prescription refill requests, have your pharmacy contact our office and allow 72 hours for refills to be completed.    Today you received the following chemotherapy and/or immunotherapy agents :  durvalumab      To help prevent nausea and vomiting after your treatment, we encourage you to take your nausea medication as directed.  BELOW ARE SYMPTOMS THAT SHOULD BE REPORTED IMMEDIATELY: *FEVER GREATER THAN 100.4 F (38 C) OR HIGHER *CHILLS OR SWEATING *NAUSEA AND VOMITING THAT IS NOT CONTROLLED WITH YOUR NAUSEA MEDICATION *UNUSUAL SHORTNESS OF BREATH *UNUSUAL BRUISING OR BLEEDING *URINARY PROBLEMS (pain or burning when urinating, or frequent urination) *BOWEL PROBLEMS (unusual diarrhea, constipation, pain near the anus) TENDERNESS IN MOUTH AND THROAT WITH OR WITHOUT PRESENCE OF ULCERS (sore throat, sores in mouth, or a toothache) UNUSUAL RASH, SWELLING OR PAIN  UNUSUAL VAGINAL DISCHARGE OR ITCHING   Items with * indicate a potential emergency and should be followed up as soon as possible or go to the Emergency Department if any problems should occur.  Please show the CHEMOTHERAPY ALERT CARD or  IMMUNOTHERAPY ALERT CARD at check-in to the Emergency Department and triage nurse.  Should you have questions after your visit or need to cancel or reschedule your appointment, please contact [facility]  Dept: [phone]  and follow the prompts.  Office hours are 8:00 a.m. to 4:30 p.m. Monday - Friday. Please note that voicemails left after 4:00 p.m. may not be returned until the following business day.  We are closed weekends and major holidays. You have access to a nurse at all times for urgent questions. Please call the main number to the clinic Dept: [phone] and follow the prompts.   For any non-urgent questions, you may also contact your provider using MyChart. We now offer e-Visits for anyone 30 and older to request care online for non-urgent symptoms. For details visit [email or web address].   Also download the MyChart app! Go to the app store, search "MyChart", open the app, select [place], and log in with your MyChart username and password.

## 2024-01-23 ENCOUNTER — Encounter: Payer: Self-pay | Admitting: Physician Assistant

## 2024-01-23 ENCOUNTER — Ambulatory Visit

## 2024-01-23 ENCOUNTER — Other Ambulatory Visit

## 2024-01-23 DIAGNOSIS — C3491 Malignant neoplasm of unspecified part of right bronchus or lung: Secondary | ICD-10-CM | POA: Diagnosis not present

## 2024-01-23 DIAGNOSIS — C3411 Malignant neoplasm of upper lobe, right bronchus or lung: Secondary | ICD-10-CM | POA: Diagnosis not present

## 2024-01-23 DIAGNOSIS — C8338 Diffuse large B-cell lymphoma, lymph nodes of multiple sites: Secondary | ICD-10-CM | POA: Diagnosis not present

## 2024-01-24 LAB — GUARDANT 360

## 2024-01-24 IMAGING — DX DG KNEE 1-2V PORT*L*
2 series · 2 of 2 positions shown · non-contrast
Comparison: None.

CLINICAL DATA: Status post left total knee arthroplasty.

EXAM:
PORTABLE LEFT KNEE - 1-2 VIEW

[knee lat]
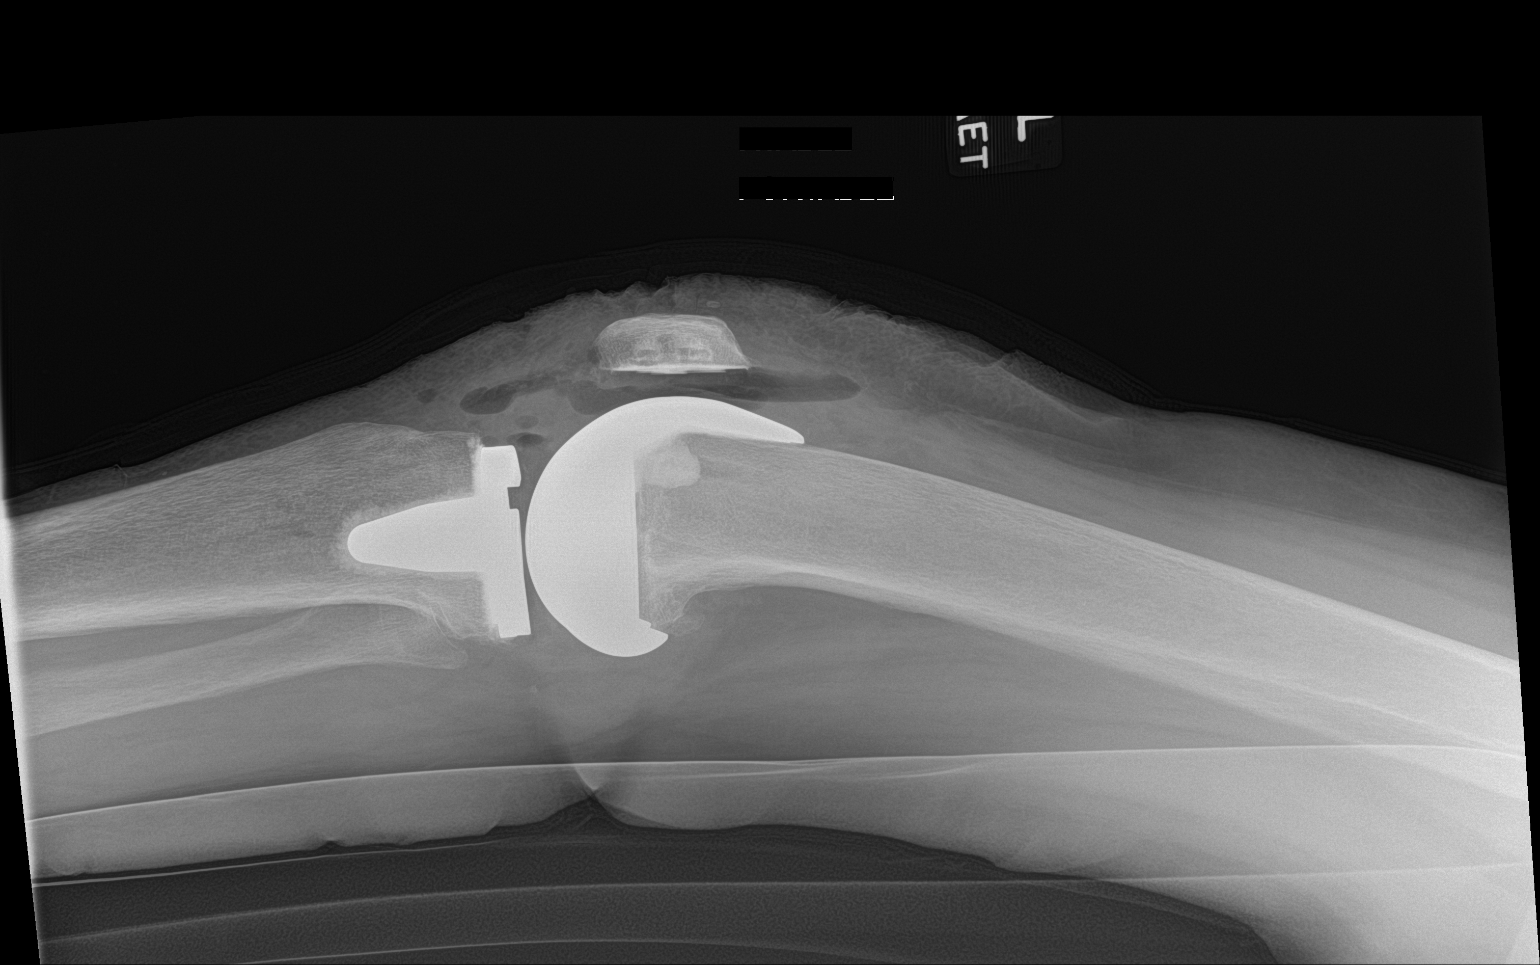

[knee ap]
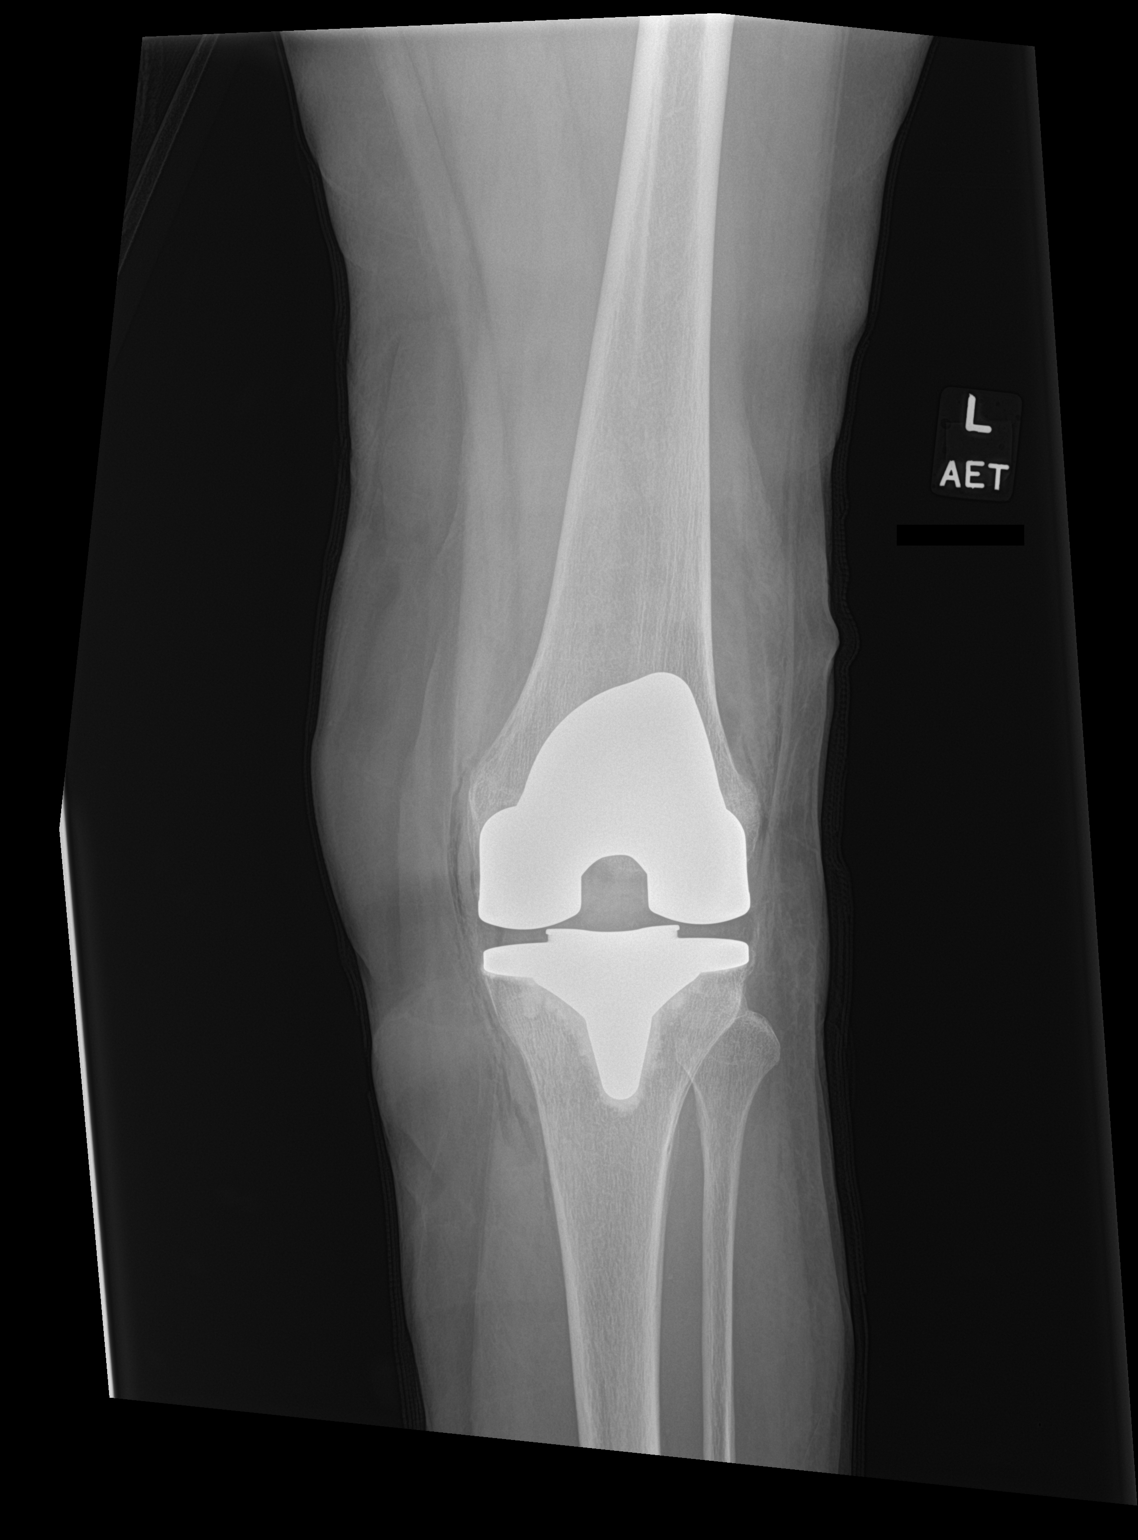

[2 of 2 positions shown; findings below may reference images not displayed]

FINDINGS: Left knee arthroplasty in expected alignment. No periprosthetic
lucency or fracture. There has been patellar resurfacing. Recent
postsurgical change includes air and edema in the soft tissues and
joint space.
IMPRESSION: Left knee arthroplasty without immediate postoperative complication.

## 2024-01-24 NOTE — Telephone Encounter (Signed)
 2nd request received.  Will route notes to the surgeons office.

## 2024-01-28 NOTE — Progress Notes (Signed)
 HEMATOLOGY/ONCOLOGY CLINIC NOTE  Date of Service: 01/29/2024  Patient Care Team: Venancio Pock, PA-C as PCP - General (Internal Medicine) Waddell Danelle ORN, MD as PCP - Cardiology (Cardiology) Harrietta Avelina LABOR, MD (Family Medicine) Onesimo Emaline Brink, MD as Consulting Physician (Hematology)  CHIEF COMPLAINTS/PURPOSE OF CONSULTATION:  Management of recently diagnosed non small cel lung cancer Hx of DLBCL   HISTORY OF PRESENTING ILLNESS:   Rachel Bates is a wonderful 78 y.o. female who has been referred to us  by Llewellyn Gerard Caldron, DO for evaluation and management of non hodgkin's lymphoma. She has a hx of chronic systolic heart failure.  Nasolaryngoscopy revealed a mass lesion of the left oropharynx, emanating from the left base of tongue/glossotonsillar sulcus. Patient subsequently had a CT neck with contrast performed on 08/09/2022, which reported extensive bilateral adenopaty, with greatest lymph node conglomerate measuring 3.2 x 2.4 cm in the right level 2. Patient underwent core needle biopsy on 08/18/22 and endorsed persistent throat pain and odynophagia at the time. She is a former smoker and quit in the late 90s. SHe endorsed heavy tobacco use prior to quitting, with 1.5 pack/day for about 30 years.   Today, she is accompanied by her daughter. She complains of a sore throat beginning around Thanksgiving time. She was given antibiotics and prednisone  which did not improve symptoms. She endorses swelling in her neck with associated soreness which came quickly past 2-3 weeks. She reports that her neck edema has recently grown on both the left and right sides. Her neck pain is in a specific area. She also complains of recent her neck pain if she turns her neck.  She did present Emergency EMT because something was stuck in her throat. Endoscopy revealed that it was food and it was pushed down. An ulcer was also found in her esophagus and some narrowing which will be stretched in  three months. She has not had any issues with swallowing food since then. She has not had a biopsy of issue on the back of her tongue. No other lumps/bumps, unexplained fevers, chills, night sweat, change in breathing, abdominal pain, or skin rashes. She reports discomfort with drinking tea which caused her to eat less as well.   She is allergic to Sumatriptan and another medication she cannot recall. She does not consume alcohol and has quit smoking in the 90s. 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 She believes the sore was caused by shoe discomfort.  She denies any facial puffiness, abdominal pain, recent change in bowel habits or urination. She reports that her defibrillator has never gone off.   She reports a ganglion cyst on her right knee, 2 knee replacements, and 2 hip replacements. She denies a Fhx of blood disorders. However, her sister has lung cancer and was a frequent smoker.  She is UTD with her vaccinations, incuding influenza, RSV, and COVID-19 booster. She has endorsed migraines since childhood. She lives on her own is able to complete daily activities independently. She will follow-up with Dr. Llewellyn soon. She follows up with cardiologist regularly. Her a fib, pacemaker, and ICD device have been stable. She has not had a recent echo.   INTERVAL HISTORY:  Rachel Bates is a 78 y.o. female who presents today for follow up and management of squamous cell carcinoma of the lung.    Patient was last seen by me on 12/10/2023.  She has received 2 cycles of Durvalumab  so far. Patient reports that she tolerated Durvalumab  well with no major toxicities.   Patient complains of significant right hand pain related to carpal tunnel and is planning to proceed with surgical intervention as soon as possible. She reports that her surgeon is Fonda Olmsted, MD.   Patient reports that she was told that  conservative management would not be beneficial in her case and there is no plan to consider steroid injections. She notes that there are plans for nerve block.   She notes that she is barely able to use her right hand. Patient reports that sometimes, unable close grip completely. She does complain of tingling/numbness. Patient notes that use of split at night triggers her tendinitis.   Patient reports that she sometimes has a dry cough. She denies any phlegm, fevers, new skin rashes, new leg swelling, or abdominal pain.   She reports mild diarrhea this past week which she attributes to recent diet changes incorporating plenty of tomatoes and cucumbers.   Patient reports that her defibrillator has not worked since a couple of months ago. She notes that her cardiologist is Dr. Waddell. Patient reports that she and her cardiologist is agreeable towards defibrillator removal since it is not working. She notes that she will see her cardiologist tomorrow.   Patient also notes that her pacemaker has been turned down very low and she has not used her pacemaker much either.   Patient reports having a one-time mechanical fall on Monday, 01/28/2024, due to stubbing her toe. Patient denies any injuries. She does not bruises in her arms. She reports some mild back pain from her fall. Patient denies any balance issues and denies any need to use a cane/walker.   She denies any issues with her port.   She has generally been eating well. Patient reports that she has had chronic sleeping issues for a while. Patient reports that she takes 50 MG Trazodone  to address her sleep issues, which usually works most of the time.   MEDICAL HISTORY:  Past Medical History:  Diagnosis Date   AICD (automatic cardioverter/defibrillator) present dual   [employer] ---  original placedment 2007/  generator change 2014 by dr danelle taylor   Anemia    Anticoagulant long-term use    eliquis    Anxiety    Arthralgia of multiple  joints    Arthritis pain    Benign hypertensive heart disease    CAD (coronary artery disease) primary cardiologist-- dr danelle taylor   MI and 2 stents 1995 in [place]    Cancer [facility])    CHF NYHA class II, chronic, systolic (HCC)    followed by dr danelle taylor   Degenerative disc disease, lumbar    Depression    Dyslipidemia    History of basal cell carcinoma (BCC) excision    right ankle area s/p  excision in office 06/ 2019  in office   History of DVT of lower extremity yrs ago before 2012   History of MI (myocardial infarction) 1995  in [place]   History of pulmonary embolus (PE) 2012   History of ventricular tachycardia    Hyperlipidemia    Hypersomnia    Hypertension    Ischemic cardiomyopathy    Migraines    Myocardial infarction (HCC)    OA (osteoarthritis)    all over   PAF (paroxysmal atrial fibrillation) (HCC)    Pneumonia    02/2023   Primary localized osteoarthritis of right hip 12/18/2017  Primary localized osteoarthritis of right knee 05/28/2018   S/P coronary artery stent placement 1995   in [place]   05-21-2018 per pt x2  stents in same coronary artery (unsure BM or DES)   Vitamin D  deficiency disease     SURGICAL HISTORY: Past Surgical History:  Procedure Laterality Date   BIOPSY  06/19/2022   Procedure: BIOPSY;  Surgeon: Elicia Claw, MD;  Location: [facility] ENDOSCOPY;  Service: Gastroenterology;;   BRONCHIAL BIOPSY  08/07/2023   Procedure: BRONCHIAL BIOPSIES;  Surgeon: Shelah Lamar RAMAN, MD;  Location[facility] ENDOSCOPY;  Service: Pulmonary;;   BRONCHIAL BRUSHINGS  08/07/2023   Procedure: BRONCHIAL BRUSHINGS;  Surgeon: Shelah Lamar RAMAN, MD;  Location: [facility] ENDOSCOPY;  Service: Pulmonary;;   BRONCHIAL NEEDLE ASPIRATION BIOPSY  08/07/2023   Procedure: BRONCHIAL NEEDLE ASPIRATION BIOPSIES;  Surgeon: Shelah Lamar RAMAN, MD;  Location: [facility] ENDOSCOPY;  Service: Pulmonary;;   CARDIAC DEFIBRILLATOR PLACEMENT  2007   CATARACT EXTRACTION, BILATERAL     COLONOSCOPY  04/14/2013    colonic polyp, status post polypectomy. Mild panocolonic diverticulosis. Small internal hemorrhoids   DILATION AND CURETTAGE OF UTERUS  yrs ago   DIRECT LARYNGOSCOPY Right 10/04/2022   Procedure: DIRECT LARYNGOSCOPY;  Surgeon: Llewellyn Gerard LABOR, DO;  Location: [facility] OR;  Service: ENT;  Laterality: Right;   ESOPHAGOGASTRODUODENOSCOPY (EGD) WITH PROPOFOL  N/A 06/19/2022   Procedure: ESOPHAGOGASTRODUODENOSCOPY (EGD) WITH PROPOFOL ;  Surgeon: Elicia Claw, MD;  Location: [facility] ENDOSCOPY;  Service: Gastroenterology;  Laterality: N/A;   FIDUCIAL MARKER PLACEMENT  08/07/2023   Procedure: FIDUCIAL MARKER PLACEMENT;  Surgeon: Shelah Lamar RAMAN, MD;  Location: [facility] ENDOSCOPY;  Service: Pulmonary;;   IMPACTION REMOVAL  06/19/2022   Procedure: IMPACTION REMOVAL;  Surgeon: Elicia Claw, MD;  Location: [facility] ENDOSCOPY;  Service: Gastroenterology;;   IMPLANTABLE CARDIOVERTER DEFIBRILLATOR GENERATOR CHANGE N/A 02/04/2013   Procedure: IMPLANTABLE CARDIOVERTER DEFIBRILLATOR GENERATOR CHANGE;  Surgeon: Danelle LELON Birmingham, MD;  Location: [facility] CATH LAB;  Service: Cardiovascular;  Laterality: N/A;   IR IMAGING GUIDED PORT INSERTION  10/27/2022   LYMPH NODE BIOPSY Right 10/04/2022   Procedure: EXCISIONAL OF RIGHT DEEP CERVICAL LYMPH NODE;  Surgeon: Llewellyn Gerard LABOR, DO;  Location: [facility] OR;  Service: ENT;  Laterality: Right;   SHOULDER ARTHROSCOPY Right 2015   TOTAL HIP ARTHROPLASTY Right 12/18/2017   Procedure: RIGHT TOTAL HIP ARTHROPLASTY;  Surgeon: Josefina Chew, MD;  Location: [facility] OR;  Service: Orthopedics;  Laterality: Right;   TOTAL HIP ARTHROPLASTY Left 04/19/2021   Procedure: TOTAL HIP ARTHROPLASTY;  Surgeon: Josefina Chew, MD;  Location: [facility] ORS;  Service: Orthopedics;  Laterality: Left;   TOTAL KNEE ARTHROPLASTY Right 05/28/2018   Procedure: TOTAL KNEE ARTHROPLASTY;  Surgeon: Josefina Chew, MD;  Location: [facility] ORS;  Service: Orthopedics;  Laterality: Right;  Adductor Block   TOTAL KNEE ARTHROPLASTY Left 08/12/2021    Procedure: TOTAL KNEE ARTHROPLASTY;  Surgeon: Josefina Chew, MD;  Location: [facility] ORS;  Service: Orthopedics;  Laterality: Left;   TUBAL LIGATION Bilateral yrs ago   VIDEO BRONCHOSCOPY WITH RADIAL ENDOBRONCHIAL ULTRASOUND  08/07/2023   Procedure: VIDEO BRONCHOSCOPY WITH RADIAL ENDOBRONCHIAL ULTRASOUND;  Surgeon: Shelah Lamar RAMAN, MD;  Location: [facility] ENDOSCOPY;  Service: Pulmonary;;   WISDOM TOOTH EXTRACTION      SOCIAL HISTORY: Social History   Socioeconomic History   Marital status: Single    Spouse name: Not on file   Number of children: 0   Years of education: 14   Highest education level: Not on file  Occupational History   Not on file  Tobacco Use  Smoking status: Former    Current packs/day: 0.00    Types: Cigarettes    Start date: 05/21/1966    Quit date: 05/21/1996    Years since quitting: 27.7   Smokeless tobacco: Never  Vaping Use   Vaping status: Never Used  Substance and Sexual Activity   Alcohol use: No   Drug use: Never   Sexual activity: Not on file  Other Topics Concern   Not on file  Social History Narrative   Not on file   Social Drivers of Health   Financial Resource Strain: Low Risk  (05/28/2018)   Overall Financial Resource Strain (CARDIA)    Difficulty of Paying Living Expenses: Not hard at all  Food Insecurity: No Food Insecurity (08/28/2023)   Hunger Vital Sign    Worried About Running Out of Food in the Last Year: Never true    Ran Out of Food in the Last Year: Never true  Transportation Needs: No Transportation Needs (08/28/2023)   PRAPARE - Administrator, Civil Service (Medical): No    Lack of Transportation (Non-Medical): No  Physical Activity: Not on file  Stress: Not on file  Social Connections: Not on file  Intimate Partner Violence: Not At Risk (08/28/2023)   Humiliation, Afraid, Rape, and Kick questionnaire    Fear of Current or Ex-Partner: No    Emotionally Abused: No    Physically Abused: No    Sexually Abused: No     FAMILY HISTORY: Family History  Problem Relation Age of Onset   Hypertension Mother    Thyroid  disease Mother    Alzheimer's disease Mother    Coronary artery disease Father    Pulmonary embolism Father    Congestive Heart Failure Maternal Grandmother    Hypertension Maternal Grandmother    Heart attack Maternal Grandfather    Other Maternal Grandfather        carotid disease   Dementia Paternal Grandmother    Other Paternal Grandfather 54       accident    ALLERGIES:  is allergic to sumatriptan succinate and amitriptyline.  MEDICATIONS:  Current Outpatient Medications  Medication Sig Dispense Refill   acetaminophen  (TYLENOL ) 650 MG CR tablet Take 1,300 mg by mouth every 8 (eight) hours as needed for pain.     b complex vitamins capsule Take 1 capsule by mouth daily.     Calcium  Carb-Cholecalciferol  (CALCIUM  600 + D PO) Take 1 tablet by mouth daily.     carvedilol  (COREG ) 12.5 MG tablet Take 12.5 mg by mouth 2 (two) times daily with a meal.      Cholecalciferol  (VITAMIN D3) 125 MCG (5000 UT) capsule Take 5,000 Units by mouth daily.     cyanocobalamin  (VITAMIN B12) 1000 MCG tablet Take 1,000 mcg by mouth daily.     ELIQUIS  5 MG TABS tablet TAKE 1 TABLET BY MOUTH TWICE A DAY 60 tablet 5   FIBER PO Take 1 capsule by mouth 2 (two) times daily.     Fiber POWD Take 1 Scoop by mouth daily.     gabapentin  (NEURONTIN ) 300 MG capsule Take 300 mg by mouth 3 (three) times daily.     lidocaine  (XYLOCAINE ) 2 % solution Use as directed 15 mLs in the mouth or throat as needed for mouth pain. 100 mL 0   lidocaine -prilocaine  (EMLA ) cream Apply to affected area once 30 g 3   loratadine  (CLARITIN ) 10 MG tablet Take 10 mg by mouth daily.     magic mouthwash  w/lidocaine  SOLN Take 5 mLs by mouth 4 (four) times daily as needed for mouth pain. Suspension contains equal amounts of Maalox Extra Strength, nystatin , diphenhydramine  and lidocaine . 400 mL 0   nystatin  (MYCOSTATIN ) 100000 UNIT/ML  suspension Take 5 mLs (500,000 Units total) by mouth 4 (four) times daily. 100 mL 0   Omega-3 Fatty Acids (FISH OIL) 1000 MG CAPS Take 1,000 mg by mouth daily.      ondansetron  (ZOFRAN ) 8 MG tablet Take 1 tablet (8 mg total) by mouth every 8 (eight) hours as needed for nausea or vomiting. 30 tablet 1   prochlorperazine  (COMPAZINE ) 10 MG tablet Take 1 tablet (10 mg total) by mouth every 6 (six) hours as needed for nausea or vomiting. 30 tablet 1   ramipril  (ALTACE ) 2.5 MG tablet Take 2.5 mg by mouth daily.       simvastatin  (ZOCOR ) 40 MG tablet Take 40 mg by mouth at bedtime.       sucralfate  (CARAFATE ) 1 g tablet TAKE 1 TABLET (1 G TOTAL) BY MOUTH 4 (FOUR) TIMES DAILY - WITH MEALS AND AT BEDTIME. DISSOLVE TAB IN 5-10 ML OF WATER  TO MAKE A THICK SLURRY AND THEN SWALLOW IT. 270 tablet 1   tiZANidine  (ZANAFLEX ) 4 MG tablet Take 2 mg by mouth daily as needed for muscle spasms.     traZODone  (DESYREL ) 50 MG tablet Take 50 mg by mouth at bedtime.      venlafaxine  (EFFEXOR ) 75 MG tablet Take 75 mg by mouth daily.     vitamin E  400 UNIT capsule Take 400 Units by mouth daily.     zonisamide  (ZONEGRAN ) 50 MG capsule Take 50-100 mg by mouth See admin instructions. Take 50mg  by mouth in the morning and 100mg  at night.     No current facility-administered medications for this visit.    REVIEW OF SYSTEMS:    10 Point review of Systems was done is negative except as noted above.   PHYSICAL EXAMINATION: .BP 135/71   Pulse 86   Temp 97.7 F (36.5 C)   Resp 20   Wt 151 lb (68.5 kg)   SpO2 93%   BMI 23.84 kg/m  GENERAL:alert, in no acute distress and comfortable SKIN: no acute rashes, no significant lesions EYES: conjunctiva are pink and non-injected, sclera anicteric OROPHARYNX: MMM, no exudates, no oropharyngeal erythema or ulceration NECK: supple, no JVD LYMPH:  no palpable lymphadenopathy in the cervical, axillary or inguinal regions LUNGS: clear to auscultation b/l with normal respiratory  effort HEART: regular rate & rhythm ABDOMEN:  normoactive bowel sounds , non tender, not distended. Extremity: no pedal edema PSYCH: alert & oriented x 3 with fluent speech NEURO: no focal motor/sensory deficits   LABORATORY DATA:  I have reviewed the data as listed    Latest Ref Rng & Units 01/30/2024   10:19 AM 01/29/2024    9:47 AM 01/16/2024    1:05 PM  CBC  WBC 3.4 - 10.8 x10E3/uL 3.8  3.8  3.9   Hemoglobin 11.1 - 15.9 g/dL 85.8  85.4  86.5   Hematocrit 34.0 - 46.6 % 42.8  42.5  38.9   Platelets 150 - 450 x10E3/uL 102  92  91       Latest Ref Rng & Units 01/30/2024   10:18 AM 01/29/2024    9:47 AM 01/16/2024    1:05 PM  CMP  Glucose 70 - 99 mg/dL 86  91  887   BUN 8 - 27 mg/dL 17  15  13   Creatinine 0.57 - 1.00 mg/dL 9.40  9.34  9.44   Sodium 134 - 144 mmol/L 138  140  142   Potassium 3.5 - 5.2 mmol/L 4.2  4.0  3.8   Chloride 96 - 106 mmol/L 101  106  109   CO2 20 - 29 mmol/L 21  29  27    Calcium  8.7 - 10.3 mg/dL 9.5  9.5  9.3   Total Protein 6.5 - 8.1 g/dL  6.9  6.4   Total Bilirubin 0.0 - 1.2 mg/dL  0.6  0.4   Alkaline Phos 38 - 126 U/L  84  84   AST 15 - 41 U/L  19  20   ALT 0 - 44 U/L  13  15    Lab Results  Component Value Date   LDH 166 12/10/2023    -01/16/2024 [facility]:                 Molecular Pathology 10/21/2022:    Surgical Pathology 10/04/22: A. LYMPH NODE, RIGHT LEVEL 2 DEEP CERVICAL, EXCISION:  -Diffuse large B-cell lymphoma  -See comment COMMENT: The sections show diffuse effacement of the lymph nodal architecture primarily by a population of large lymphoid cells characterized by vesicular chromatin and small nucleoli associated with apoptosis and brisk mitosis.  In some areas, the large atypical lymphoid proliferation extends into the perinodal adipose tissue.  In this background, there are scattered variably sized and somewhat disrupted aggregates of primarily small lymphoid cells characterized by high nuclear  cytoplasmic ratio, angulated nuclear contours and small to inconspicuous nucleoli. Flow cytometric analysis was performed [facility] [id number]) and shows a monoclonal, lambda restricted B-cell population expressing CD10.  In addition, immunohistochemical stains for CD3, CD5, CD10, CD20, PAX5, BCL6, Bcl-2, Ki-67, CD30, CD138, CD21, EBV in addition to in situ hybridization for kappa and lambda were performed with appropriate controls.  The large lymphoid cells are positive for CD20, PAX5, CD10, BCL6, Bcl-2, and partially for cytoplasmic lambda.  No significant staining is seen with EBV, CD30, CD138 or cytoplasmic kappa.  Ki67 shows variably increased expression (more than 50% in some areas). CD21 highlights scattered somewhat disrupted follicular dendritic networks. The lymphoid aggregates of primarily small lymphoid cells previously described show positivity for B-cell markers CD20 and PAX5 in addition to CD10 and Bcl-2.  There is an admixed variable T-cell component in the background as seen with CD3 and CD5 and there is no apparent co-expression of CD5 in B-cell areas.  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    RADIOGRAPHIC STUDIES: I have personally reviewed the radiological images as listed and agreed with the findings in the report. CT HEAD W & WO CONTRAST ( ) Result Date: 01/01/2024 CLINICAL DATA:  Non-small cell lung cancer with frontal bone lesion. Interval rib evaluation. EXAM: CT HEAD WITHOUT AND WITH CONTRAST TECHNIQUE: Contiguous axial images were obtained from the base of the skull through the vertex without and with intravenous contrast. RADIATION DOSE REDUCTION: This exam was performed according to the departmental dose-optimization program which includes automated exposure control, adjustment of the mA and/or kV according to patient size  and/or use of iterative reconstruction technique. CONTRAST:  75mL OMNIPAQUE  IOHEXOL  300 MG/ML  SOLN COMPARISON:  09/03/2023 CT.  11/28/2010 CT. FINDINGS: Brain: Brain continues to have a normal appearance without evidence of accelerated atrophy, old or acute infarction, intracranial mass lesion, hemorrhage,  hydrocephalus or extra-axial collection. After contrast administration, no abnormal enhancement occurs. Vascular: Minimal age related atherosclerotic calcification. Skull: Redemonstration of an area of architectural distortion in the left frontal bone immediately above the frontal sinus. There is no evidence of change or progression since the study of February. No second skull abnormality is seen. I agree that this was not present in 2012. If this does represent a bone metastasis, it is indolent/nonprogressive over this 4 month follow-up. There was no hypermetabolism visible at this location at the time of the PET scan. Sinuses/Orbits: Clear/normal Other: None IMPRESSION: 1. No change since the study of February. No evidence of intracranial metastatic disease. 2. Redemonstration of an area of architectural distortion in the left frontal bone immediately above the frontal sinus. There is no evidence of change or progression since the study of February. No second skull abnormality is seen. I agree that this was not present in 2012. If this does represent a bone metastasis, it is indolent/nonprogressive over this 4 month follow-up. There was no hypermetabolism visible at this location at the time of the PET scan, and I do not think this is definitely a malignant lesion. Additional follow-up would seem prudent. Electronically Signed   By: Oneil Officer M.D.   On: 01/01/2024 16:34   NM PET Image Restag (PS) Skull Base To Thigh Result Date: 12/31/2023 CLINICAL DATA:  Subsequent treatment strategy for non-small-cell lung cancer. Assess treatment response. EXAM: NUCLEAR MEDICINE PET SKULL BASE TO THIGH TECHNIQUE: 7.65  mCi F-18 FDG was injected intravenously. Full-ring PET imaging was performed from the skull base to thigh after the radiotracer. CT data was obtained and used for attenuation correction and anatomic localization. Fasting blood glucose: 104 mg/dl COMPARISON:  PET-CT 97/85/7974 and older. FINDINGS: Mediastinal blood pool activity: SUV max 2.8 Liver activity: SUV max 3.2 NECK: No specific abnormal uptake identified in the left neck. There are some small nodes identified in the low right neck just above the thoracic inlet, supraclavicular region example has maximum SUV of 6.8 along lymph node just posterior to the course of the port catheter supraclavicular. Trace uptake in this location previously. Node in this location previously would have measured 7 mm in short axis and today on image 42 16 mm. Just caudal and posterior to this, posterior to the right thyroid  lobe is also 1 lymph node with maximum SUV value of 11.9 and diameter on image 45 short axis of 11 mm. This is new from previous. Incidental CT findings: The parotid glands, submandibular glands and thyroid  gland are unremarkable. Visualized paranasal sinuses and mastoid air cells are clear. CHEST: Previously there is a right upper lobe lung nodule near the hilum which measured 2.2 by 1.5 cm with maximum SUV value of 8.5. Today this has maximum SUV of 6.6 and lesion on image 60 today measures 1.8 by 1.1 cm when measured in the same fashion. Previously there were several abnormal lymph nodes identified in the right hilum, mediastinum previous include right paratracheal and subcarinal. These nodes are again seen. The right hilar area which had maximum SUV of 10.1, today has maximum SUV value of 5.9. The subcarinal focus previously had maximum SUV value of 9.1 and today 5.5. However there are new right paratracheal nodes more superiorly. Example today has maximum SUV of 8.3 on image 53 with diameter approaching 12 mm. This is partially obscured by the artifact  from the left upper chest defibrillator. In addition the left upper lobe nodular area which had maximum SUV value of  1.9 and a small nodule measuring 8 mm, today shows a much larger nodule or cluster of nodules measuring in total up to 12 mm. Maximum SUV value today of 5.0. No additional abnormal areas of uptake in the axillary regions, left hilum or along the lung parenchyma. Incidental CT findings: Breathing motion. There is some dependent atelectasis. Chronic lung changes. Again streak artifact related to the left upper chest battery pack with leads along the right side of the heart. There is a separate right IJ chest port with tip extending to the right atrium. Heart is nonenlarged. Slight increase in small pericardial fluid. Scattered vascular calcifications. The no pleural effusion. ABDOMEN/PELVIS: There is physiologic distribution radiotracer along the parenchymal organs, bowel and renal collecting systems. No specific abnormal nodal uptake. Incidental CT findings: There is some contrast in the renal collecting systems. Please correlate with previous contrast head CT. Scattered vascular calcifications are seen. Breathing motion. Grossly the liver, spleen, adrenal glands pancreas are unremarkable. The gallbladder is nondilated. Large bowel has a normal course and caliber. Sigmoid colon diverticula. Normal appendix. The stomach and small bowel are nondilated. There is significant streak artifact related to the patient's bowel hip arthroplasties obscuring portions of the pelvis. This includes bowel, bladder and adnexa. SKELETON: No specific abnormal uptake along the visualized osseous structures. Incidental CT findings: Curvature of the spine. Diffuse degenerative changes. Again fixation hardware about the hips. IMPRESSION: Previous right upper lobe perihilar hypermetabolic mass and the previously seen nodes in the right hilum in mid mediastinum appears smaller and show less uptake. However there are new  hypermetabolic nodes more superiorly in the right side of the mediastinum including paratracheal region, thoracic inlet as well as in the low right neck, supraclavicular. Left upper lobe nodular area with uptake is larger and shows more uptake today. Overall mixed response. No areas of abnormal uptake seen beneath the diaphragm. Electronically Signed   By: Ranell Bring M.D.   On: 12/31/2023 18:59     NM PET 09/04/2022: IMPRESSION: 1. There is a large tracer avid mass centered within the left base of tongue and left lingual region which extends into the hypopharyngeal region ventral to the epiglottis. Imaging findings are compatible with a primary head and neck malignancy. 2. A smaller focus of increased uptake localizes to the right pharyngeal tonsil region. Indeterminate. 3. Bilateral tracer avid cervical and supraclavicular lymph nodes compatible with nodal metastasis. 4. There is a 7 mm tracer avid nodule within the anteromedial left upper lobe which is suspicious for pulmonary metastasis. 5. There is increased uptake identified along the long axis of the posterior right seventh rib. No corresponding lytic or sclerotic changes identified on the CT images. Cannot exclude bone metastases. 6. Multiple prominent retroperitoneal and mesenteric lymph nodes are identified which exhibit mild tracer uptake. This is a nonspecific finding and may reflect reactive adenopathy. Metastatic adenopathy cannot be excluded. Attention on future surveillance imaging is advised. 7.  Aortic Atherosclerosis (ICD10-I70.0).   ASSESSMENT & PLAN:   78 y.o.  female with:   1. 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 2. 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 CBC showed WBC  of 3.8K, hemoglobin of 14.5, and platelets of 92K. -mild thrombocytopenia -other labs are pending at this time -reviewed her PET and CT head imaging from 12/31/2023 -patient has tolerated cycle 2 of Durvalumab  well with no major toxicities -patient is appropriate to proceed with cycle 3 day 1 of Durvalumab  today -we will continue Durvalumab  every 2 weeks due to her upcoming carpal tunnel surgery.  -discussed that after her surgery, if she tolerates Durvalumab  well and if her scans are stable, we can consider switching Durvalumab  from every 2 weeks to once a month -discussed that [employer] evaluated if there are any mutations that are targetable with targeted therapy. In her case, none of the eight possible mutations were targetable.  -Discussed that there were findings of increased tumor mutation burden  -discussed that squamous cell carcinomas generally have a high tendency to be treated with immunotherapy -discussed that there is no contraindication from an oncologic/hematologic standpoint to proceed with carpal tunnel surgery with Fonda Olmsted, MD given that she is not on chemotherapy and her blood counts are stable -recommend thumb guard as well along with her splint  -discussed that immunotherapy should not affect the way she heals from defibrillator removal procedure. Discussed that the decision of whether her defibrillator should be removed shall be made by her cardiologist, Dr. Waddell.  -advised patient to be cautious of falls -CT scan noted to be ordered for early August by Neomi PA -answered all of patient's questions in detail  FOLLOW-UP: Per integrated scheduling continue Durvalumab  every 2 weeks  The total time spent in the appointment was 40 minutes* .  All of the patient's questions were answered  with apparent satisfaction. The patient knows to call the clinic with any problems, questions or concerns.   Emaline Saran MD MS AAHIVMS [facility] Hematology/Oncology Physician [facility]  .*Total Encounter Time as defined by the Centers for Medicare and Medicaid Services includes, in addition to the face-to-face time of a patient visit (documented in the note above) non-face-to-face time: obtaining and reviewing outside history, ordering and reviewing medications, tests or procedures, care coordination (communications with other health care professionals or caregivers) and documentation in the medical record.    I,Mitra Faeizi,acting as a Neurosurgeon for Emaline Saran, MD.,have documented all relevant documentation on the behalf of Emaline Saran, MD,as directed by  Emaline Saran, MD while in the presence of Emaline Saran, MD.  .I have reviewed the above documentation for accuracy and completeness, and I agree with the above. .Maycel Riffe Kishore Nikolaos Maddocks MD

## 2024-01-29 ENCOUNTER — Inpatient Hospital Stay

## 2024-01-29 ENCOUNTER — Inpatient Hospital Stay (HOSPITAL_BASED_OUTPATIENT_CLINIC_OR_DEPARTMENT_OTHER): Admitting: Hematology

## 2024-01-29 VITALS — BP 104/64 | HR 80 | Temp 97.8°F | Resp 18

## 2024-01-29 VITALS — BP 135/71 | HR 86 | Temp 97.7°F | Resp 20 | Wt 151.0 lb

## 2024-01-29 DIAGNOSIS — Z5112 Encounter for antineoplastic immunotherapy: Secondary | ICD-10-CM

## 2024-01-29 DIAGNOSIS — Z7189 Other specified counseling: Secondary | ICD-10-CM

## 2024-01-29 DIAGNOSIS — Z95828 Presence of other vascular implants and grafts: Secondary | ICD-10-CM

## 2024-01-29 DIAGNOSIS — Z79899 Other long term (current) drug therapy: Secondary | ICD-10-CM | POA: Diagnosis not present

## 2024-01-29 DIAGNOSIS — C3491 Malignant neoplasm of unspecified part of right bronchus or lung: Secondary | ICD-10-CM

## 2024-01-29 DIAGNOSIS — Z87891 Personal history of nicotine dependence: Secondary | ICD-10-CM | POA: Diagnosis not present

## 2024-01-29 DIAGNOSIS — Z79620 Long term (current) use of immunosuppressive biologic: Secondary | ICD-10-CM | POA: Diagnosis not present

## 2024-01-29 DIAGNOSIS — C3411 Malignant neoplasm of upper lobe, right bronchus or lung: Secondary | ICD-10-CM | POA: Diagnosis not present

## 2024-01-29 LAB — CMP (CANCER CENTER ONLY)
ALT: 13 U/L (ref 0–44)
AST: 19 U/L (ref 15–41)
Albumin: 4.1 g/dL (ref 3.5–5.0)
Alkaline Phosphatase: 84 U/L (ref 38–126)
Anion gap: 5 (ref 5–15)
BUN: 15 mg/dL (ref 8–23)
CO2: 29 mmol/L (ref 22–32)
Calcium: 9.5 mg/dL (ref 8.9–10.3)
Chloride: 106 mmol/L (ref 98–111)
Creatinine: 0.65 mg/dL (ref 0.44–1.00)
GFR, Estimated: >60 mL/min (ref >60)
Glucose, Bld: 91 mg/dL (ref 70–99)
Potassium: 4 mmol/L (ref 3.5–5.1)
Sodium: 140 mmol/L (ref 135–145)
Total Bilirubin: 0.6 mg/dL (ref 0.0–1.2)
Total Protein: 6.9 g/dL (ref 6.5–8.1)

## 2024-01-29 LAB — CBC WITH DIFFERENTIAL (CANCER CENTER ONLY)
Abs Immature Granulocytes: 0.01 K/uL (ref 0.00–0.07)
Basophils Absolute: 0 K/uL (ref 0.0–0.1)
Basophils Relative: 0 %
Eosinophils Absolute: 0.1 K/uL (ref 0.0–0.5)
Eosinophils Relative: 2 %
HCT: 42.5 % (ref 36.0–46.0)
Hemoglobin: 14.5 g/dL (ref 12.0–15.0)
Immature Granulocytes: 0 %
Lymphocytes Relative: 15 %
Lymphs Abs: 0.6 K/uL — ABNORMAL LOW (ref 0.7–4.0)
MCH: 30.6 pg (ref 26.0–34.0)
MCHC: 34.1 g/dL (ref 30.0–36.0)
MCV: 89.7 fL (ref 80.0–100.0)
Monocytes Absolute: 0.5 K/uL (ref 0.1–1.0)
Monocytes Relative: 13 %
Neutro Abs: 2.7 K/uL (ref 1.7–7.7)
Neutrophils Relative %: 70 %
Platelet Count: 92 K/uL — ABNORMAL LOW (ref 150–400)
RBC: 4.74 MIL/uL (ref 3.87–5.11)
RDW: 13.3 % (ref 11.5–15.5)
WBC Count: 3.8 K/uL — ABNORMAL LOW (ref 4.0–10.5)
nRBC: 0 % (ref 0.0–0.2)

## 2024-01-29 LAB — TSH: TSH: 1.74 u[IU]/mL (ref 0.350–4.500)

## 2024-01-29 MED ORDER — SODIUM CHLORIDE 0.9% FLUSH
10.0000 mL | INTRAVENOUS | Status: DC | PRN
  Administered 2024-01-29: 10 mL

## 2024-01-29 MED ORDER — SODIUM CHLORIDE 0.9% FLUSH
10.0000 mL | Freq: Once | INTRAVENOUS | Status: AC
  Administered 2024-01-29: 10 mL

## 2024-01-29 MED ORDER — HEPARIN SOD (PORK) LOCK FLUSH 100 UNIT/ML IV SOLN
500.0000 [IU] | Freq: Once | INTRAVENOUS | Status: AC | PRN
Start: 2024-01-29 — End: 2024-01-29
  Administered 2024-01-29: 500 [IU]

## 2024-01-29 MED ORDER — SODIUM CHLORIDE 0.9 % IV SOLN
10.0000 mg/kg | Freq: Once | INTRAVENOUS | Status: AC
  Administered 2024-01-29: 740 mg via INTRAVENOUS
  Filled 2024-01-29: qty 10

## 2024-01-29 MED ORDER — SODIUM CHLORIDE 0.9 % IV SOLN: INTRAVENOUS | Status: DC

## 2024-01-29 NOTE — Patient Instructions (Signed)
 [facility] - A DEPT OF [facility]  Discharge Instructions: Thank you for choosing [facility] to provide your oncology and hematology care.   If you have a lab appointment with the Cancer Center, please go directly to the Cancer Center and check in at the registration area.   Wear comfortable clothing and clothing appropriate for easy access to any Portacath or PICC line.   We strive to give you quality time with your provider. You may need to reschedule your appointment if you arrive late (15 or more minutes).  Arriving late affects you and other patients whose appointments are after yours.  Also, if you miss three or more appointments without notifying the office, you may be dismissed from the clinic at the provider's discretion.      For prescription refill requests, have your pharmacy contact our office and allow 72 hours for refills to be completed.    Today you received the following chemotherapy and/or immunotherapy agent: Durvalumab (Imfinzi).    To help prevent nausea and vomiting after your treatment, we encourage you to take your nausea medication as directed.  BELOW ARE SYMPTOMS THAT SHOULD BE REPORTED IMMEDIATELY: *FEVER GREATER THAN 100.4 F (38 C) OR HIGHER *CHILLS OR SWEATING *NAUSEA AND VOMITING THAT IS NOT CONTROLLED WITH YOUR NAUSEA MEDICATION *UNUSUAL SHORTNESS OF BREATH *UNUSUAL BRUISING OR BLEEDING *URINARY PROBLEMS (pain or burning when urinating, or frequent urination) *BOWEL PROBLEMS (unusual diarrhea, constipation, pain near the anus) TENDERNESS IN MOUTH AND THROAT WITH OR WITHOUT PRESENCE OF ULCERS (sore throat, sores in mouth, or a toothache) UNUSUAL RASH, SWELLING OR PAIN  UNUSUAL VAGINAL DISCHARGE OR ITCHING   Items with * indicate a potential emergency and should be followed up as soon as possible or go to the Emergency Department if any problems should occur.  Please show the CHEMOTHERAPY ALERT CARD or  IMMUNOTHERAPY ALERT CARD at check-in to the Emergency Department and triage nurse.  Should you have questions after your visit or need to cancel or reschedule your appointment, please contact [facility] - [facility] DE[facility]  Dept: [phone]  and follow the prompts.  Office hours are 8:00 a.m. to 4:30 p.m. Monday - Friday. Please note that voicemails left after 4:00 p.m. may not be returned until the following business day.  We are closed weekends and major holidays. You have access to a nurse at all times for urgent questions. Please call the main number to the clinic Dept: [phone] and follow the prompts.   For any non-urgent questions, you may also contact your provider using MyChart. We now offer e-Visits for anyone 11 and older to request care online for non-urgent symptoms. For details visit [email or web address].   Also download the MyChart app! Go to the app store, search "MyChart", open the app, select [place], and log in with your MyChart username and password.

## 2024-01-30 ENCOUNTER — Ambulatory Visit: Attending: Cardiovascular Disease | Admitting: Internal Medicine

## 2024-01-30 ENCOUNTER — Encounter: Payer: Self-pay | Admitting: Internal Medicine

## 2024-01-30 VITALS — BP 132/66 | HR 79 | Ht 67.0 in | Wt 152.8 lb

## 2024-01-30 DIAGNOSIS — I472 Ventricular tachycardia, unspecified: Secondary | ICD-10-CM

## 2024-01-30 DIAGNOSIS — Z01812 Encounter for preprocedural laboratory examination: Secondary | ICD-10-CM | POA: Diagnosis not present

## 2024-01-30 DIAGNOSIS — Z01810 Encounter for preprocedural cardiovascular examination: Secondary | ICD-10-CM | POA: Diagnosis not present

## 2024-01-30 LAB — T4: T4, Total: 6.7 ug/dL (ref 4.5–12.0)

## 2024-01-30 LAB — BASIC METABOLIC PANEL WITH GFR
BUN/Creatinine Ratio: 29 — ABNORMAL HIGH (ref 12–28)
BUN: 17 mg/dL (ref 8–27)
CO2: 21 mmol/L (ref 20–29)
Calcium: 9.5 mg/dL (ref 8.7–10.3)
Chloride: 101 mmol/L (ref 96–106)
Creatinine, Ser: 0.59 mg/dL (ref 0.57–1.00)
Glucose: 86 mg/dL (ref 70–99)
Potassium: 4.2 mmol/L (ref 3.5–5.2)
Sodium: 138 mmol/L (ref 134–144)
eGFR: 92 mL/min/1.73 (ref >59)

## 2024-01-30 NOTE — Patient Instructions (Signed)

## 2024-01-30 NOTE — Progress Notes (Signed)
 HPI Rachel Bates returns today for followup. She is a pleasant 78 yo woman with a h/o CAD, s/p remote MI who underwent ICD insertion over 10 years ago. She has reached ERI on her ICD. 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 She has reached end of service on her ICD. Her EF was 50% and she has never had therapies and has decided not to place a new device. Her current device bothers her and she would like to have the can removed.  Allergies  Allergen Reactions   Sumatriptan Succinate Other (See Comments)    Chest pain, no triptans, pt states it makes my heart race   Amitriptyline Other (See Comments)    Weight gain     Current Outpatient Medications  Medication Sig Dispense Refill   acetaminophen  (TYLENOL ) 650 MG CR tablet Take 1,300 mg by mouth every 8 (eight) hours as needed for pain.     b complex vitamins capsule Take 1 capsule by mouth daily.     Calcium  Carb-Cholecalciferol  (CALCIUM  600 + D PO) Take 1 tablet by mouth daily.     carvedilol  (COREG ) 12.5 MG tablet Take 12.5 mg by mouth 2 (two) times daily with a meal.      Cholecalciferol  (VITAMIN D3) 125 MCG (5000 UT) capsule Take 5,000 Units by mouth daily.     cyanocobalamin  (VITAMIN B12) 1000 MCG tablet Take 1,000 mcg by mouth daily.     ELIQUIS  5 MG TABS tablet TAKE 1 TABLET BY MOUTH TWICE A DAY 60 tablet 5   FIBER PO Take 1 capsule by mouth 2 (two) times daily.     Fiber POWD Take 1 Scoop by mouth daily.     gabapentin  (NEURONTIN ) 300 MG capsule Take 300 mg by mouth 3 (three) times daily.     lidocaine  (XYLOCAINE ) 2 % solution Use as directed 15 mLs in the mouth or throat as needed for mouth pain. 100 mL 0   lidocaine -prilocaine  (EMLA ) cream Apply to affected area once 30 g 3   loratadine  (CLARITIN ) 10 MG tablet Take 10 mg by mouth daily.     Omega-3 Fatty Acids (FISH OIL) 1000 MG CAPS Take 1,000 mg by mouth daily.      prochlorperazine   (COMPAZINE ) 10 MG tablet Take 1 tablet (10 mg total) by mouth every 6 (six) hours as needed for nausea or vomiting. 30 tablet 1   ramipril  (ALTACE ) 2.5 MG tablet Take 2.5 mg by mouth daily.       simvastatin  (ZOCOR ) 40 MG tablet Take 40 mg by mouth at bedtime.       tiZANidine  (ZANAFLEX ) 4 MG tablet Take 2 mg by mouth daily as needed for muscle spasms.     traZODone  (DESYREL ) 50 MG tablet Take 50 mg by mouth at bedtime.      venlafaxine  (EFFEXOR ) 75 MG tablet Take 75 mg by mouth daily.     vitamin E  400 UNIT capsule Take 400 Units by mouth daily.     zonisamide  (ZONEGRAN ) 50 MG capsule Take 50-100 mg by mouth See admin instructions. Take 50mg  by mouth in the morning and 100mg  at night.     magic mouthwash w/lidocaine  SOLN Take 5 mLs by mouth 4 (four) times daily as needed for mouth pain. Suspension contains equal amounts of Maalox Extra Strength, nystatin , diphenhydramine  and lidocaine . (Patient not taking: Reported on 01/30/2024) 400 mL 0   nystatin  (MYCOSTATIN )  100000 UNIT/ML suspension Take 5 mLs (500,000 Units total) by mouth 4 (four) times daily. (Patient not taking: Reported on 01/30/2024) 100 mL 0   ondansetron  (ZOFRAN ) 8 MG tablet Take 1 tablet (8 mg total) by mouth every 8 (eight) hours as needed for nausea or vomiting. (Patient not taking: Reported on 01/30/2024) 30 tablet 1   sucralfate  (CARAFATE ) 1 g tablet TAKE 1 TABLET (1 G TOTAL) BY MOUTH 4 (FOUR) TIMES DAILY - WITH MEALS AND AT BEDTIME. DISSOLVE TAB IN 5-10 ML OF WATER  TO MAKE A THICK SLURRY AND THEN SWALLOW IT. (Patient not taking: Reported on 01/30/2024) 270 tablet 1   No current facility-administered medications for this visit.     Past Medical History:  Diagnosis Date   AICD (automatic cardioverter/defibrillator) present dual   [employer]tr[employer] ---  original placedment 2007/  generator change 2014 by dr danelle Tim Corriher   Anemia    Anticoagulant long-term use    eliquis    Anxiety    Arthralgia of multiple joints    Arthritis pain     Benign hypertensive heart disease    CAD (coronary artery disease) primary cardiologist-- dr danelle Chastidy Ranker   MI and 2 stents 1995 in [place]    Cancer [facility])    CHF NYHA class II, chronic, systolic (HCC)    followed by dr danelle Kashawn Manzano   Degenerative disc disease, lumbar    Depression    Dyslipidemia    History of basal cell carcinoma (BCC) excision    right ankle area s/p  excision in office 06/ 2019  in office   History of DVT of lower extremity yrs ago before 2012   History of MI (myocardial infarction) 1995  in [place]   History of pulmonary embolus (PE) 2012   History of ventricular tachycardia    Hyperlipidemia    Hypersomnia    Hypertension    Ischemic cardiomyopathy    Migraines    Myocardial infarction (HCC)    OA (osteoarthritis)    all over   PAF (paroxysmal atrial fibrillation) (HCC)    Pneumonia    02/2023   Primary localized osteoarthritis of right hip 12/18/2017   Primary localized osteoarthritis of right knee 05/28/2018   S/P coronary artery stent placement 1995   in [facility]   05-21-2018 per pt x2  stents in same coronary artery (unsure BM or DES)   Vitamin D  deficiency disease     ROS:   All systems reviewed and negative except as noted in the HPI.   Past Surgical History:  Procedure Laterality Date   BIOPSY  06/19/2022   Procedure: BIOPSY;  Surgeon: Elicia Claw, MD;  [facility];  Service: Gastroenterology;;   BRONCHIAL BIOPSY  08/07/2023   Procedure: BRONCHIAL BIOPSIES;  Surgeon: Shelah Lamar RAMAN, MD;  Location[facility]COPY;  Service: Pulmonary;;   BRONCHIAL BRUSHINGS  08/07/2023   Procedure: BRONCHIAL BRUSHINGS;  Surgeon: Shelah Lamar RAMAN, MD;  Location: [facility] ENDOSCOPY;  Service: Pulmonary;;   BRONCHIAL NEEDLE ASPIRATION BIOPSY  08/07/2023   Procedure: BRONCHIAL NEEDLE ASPIRATION BIOPSIES;  Surgeon: Shelah Lamar RAMAN, MD;  Location: [facility];  Service: Pulmonary;;   CARDIAC DEFIBRILLATOR PLACEMENT  2007   CATARACT EXTRACTION, BILATERAL      COLONOSCOPY  04/14/2013   colonic polyp, status post polypectomy. Mild panocolonic diverticulosis. Small internal hemorrhoids   DILATION AND CURETTAGE OF UTERUS  yrs ago   DIRECT LARYNGOSCOPY Right 10/04/2022   Procedure: DIRECT LARYNGOSCOPY;  Surgeon: Llewellyn Sayres A, DO;  Location: [facility] OR;  Service: ENT;  Laterality: Right;   ESOPHAGOGASTRODUODENOSCOPY (EGD) WITH PROPOFOL  N/A 06/19/2022   Procedure: ESOPHAGOGASTRODUODENOSCOPY (EGD) WITH PROPOFOL ;  Surgeon: Elicia Claw, MD;  [facility];  Service: Gastroenterology;  Laterality: N/A;   FIDUCIAL MARKER PLACEMENT  08/07/2023   Procedure: FIDUCIAL MARKER PLACEMENT;  Surgeon: Shelah Lamar RAMAN, MD;  Location[facility] ENDOSCOPY;  Service: Pulmonary;;   IMPACTION REMOVAL  06/19/2022   Procedure: IMPACTION REMOVAL;  Surgeon: Elicia Claw, MD;  [facility];  Service: Gastroenterology;;   IMPLANTABLE CARDIOVERTER DEFIBRILLATOR GENERATOR CHANGE N/A 02/04/2013   Procedure: IMPLANTABLE CARDIOVERTER DEFIBRILLATOR GENERATOR CHANGE;  Surgeon: Danelle LELON Birmingham, MD;  Location: [facility] CATH LAB;  Service: Cardiovascular;  Laterality: N/A;   IR IMAGING GUIDED PORT INSERTION  10/27/2022   LYMPH NODE BIOPSY Right 10/04/2022   Procedure: EXCISIONAL OF RIGHT DEEP CERVICAL LYMPH NODE;  Surgeon: Llewellyn Gerard LABOR, DO;  Location: [facility];  Service: ENT;  Laterality: Right;   SHOULDER ARTHROSCOPY Right 2015   TOTAL HIP ARTHROPLASTY Right 12/18/2017   Procedure: RIGHT TOTAL HIP ARTHROPLASTY;  Surgeon: Josefina Chew, MD;  [facility];  Service: Orthopedics;  Laterality: Right;   TOTAL HIP ARTHROPLASTY Left 04/19/2021   Procedure: TOTAL HIP ARTHROPLASTY;  Surgeon: Josefina Chew, MD;  [facility];  Service: Orthopedics;  Laterality: Left;   TOTAL KNEE ARTHROPLASTY Right 05/28/2018   Procedure: TOTAL KNEE ARTHROPLASTY;  Surgeon: Josefina Chew, MD;  [facility];  Service: Orthopedics;  Laterality: Right;  Adductor Block   TOTAL KNEE  ARTHROPLASTY Left 08/12/2021   Procedure: TOTAL KNEE ARTHROPLASTY;  Surgeon: Josefina Chew, MD;  [facility];  Service: Orthopedics;  Laterality: Left;   TUBAL LIGATION Bilateral yrs ago   VIDEO BRONCHOSCOPY WITH RADIAL ENDOBRONCHIAL ULTRASOUND  08/07/2023   Procedure: VIDEO BRONCHOSCOPY WITH RADIAL ENDOBRONCHIAL ULTRASOUND;  Surgeon: Shelah Lamar RAMAN, MD;  [facility];  Service: Pulmonary;;   WISDOM TOOTH EXTRACTION       Family History  Problem Relation Age of Onset   Hypertension Mother    Thyroid  disease Mother    Alzheimer's disease Mother    Coronary artery disease Father    Pulmonary embolism Father    Congestive Heart Failure Maternal Grandmother    Hypertension Maternal Grandmother    Heart attack Maternal Grandfather    Other Maternal Grandfather        carotid disease   Dementia Paternal Grandmother    Other Paternal Grandfather 69       accident     Social History   Socioeconomic History   Marital status: Single    Spouse name: Not on file   Number of children: 0   Years of education: 14   Highest education level: Not on file  Occupational History   Not on file  Tobacco Use   Smoking status: Former    Current packs/day: 0.00    Types: Cigarettes    Start date: 05/21/1966    Quit date: 05/21/1996    Years since quitting: 27.7   Smokeless tobacco: Never  Vaping Use   Vaping status: Never Used  Substance and Sexual Activity   Alcohol use: No   Drug use: Never   Sexual activity: Not on file  Other Topics Concern   Not on file  Social History Narrative   Not on file   Social Drivers of Health   Financial Resource Strain: Low Risk  (05/28/2018)   Overall Financial Resource Strain (CARDIA)    Difficulty of Paying Living Expenses: Not hard  at all  Food Insecurity: No Food Insecurity (08/28/2023)   Hunger Vital Sign    Worried About Running Out of Food in the Last Year: Never true    Ran Out of Food in the Last Year: Never true   Transportation Needs: No Transportation Needs (08/28/2023)   PRAPARE - Administrator, Civil Service (Medical): No    Lack of Transportation (Non-Medical): No  Physical Activity: Not on file  Stress: Not on file  Social Connections: Not on file  Intimate Partner Violence: Not At Risk (08/28/2023)   Humiliation, Afraid, Rape, and Kick questionnaire    Fear of Current or Ex-Partner: No    Emotionally Abused: No    Physically Abused: No    Sexually Abused: No     BP 132/66   Pulse 79   Ht 5' 7 (1.702 m)   Wt 152 lb 12.8 oz (69.3 kg)   SpO2 94%   BMI 23.93 kg/m   Physical Exam:  Well appearing NAD HEENT: Unremarkable Neck:  No JVD, no thyromegally Lymphatics:  No adenopathy Back:  No CVA tenderness Lungs:  Clear HEART:  Regular rate rhythm, no murmurs, no rubs, no clicks Abd:  soft, positive bowel sounds, no organomegally, no rebound, no guarding Ext:  2 plus pulses, no edema, no cyanosis, no clubbing Skin:  No rashes no nodules Neuro:  CN II through XII intact, motor grossly intact  EKG - nsr with inferior MI  DEVICE  Normal device function.  See PaceArt for details. EOS  Assess/Plan:  ICM - she has a repeat echo of 50%. I have discussed the treatment options and recommend watchful waiting.  ICD - she is at EOS on her device. I discussed the treatment options and we will tentatively plan to hold off on ICD gen change. However she would like to have the can removed as it is bothering her. We will do so but not the leads. She understands that there is a small risk of infection from the procedure. Sinus brady - she was pacing 5% and her device was reprogrammed her to VVI 30/min and she has not had any pacing.     Danelle Demika Langenderfer,MD

## 2024-01-31 LAB — CBC
Hematocrit: 42.8 % (ref 34.0–46.6)
Hemoglobin: 14.1 g/dL (ref 11.1–15.9)
MCH: 31.2 pg (ref 26.6–33.0)
MCHC: 32.9 g/dL (ref 31.5–35.7)
MCV: 95 fL (ref 79–97)
Platelets: 102 x10E3/uL — ABNORMAL LOW (ref 150–450)
RBC: 4.52 x10E6/uL (ref 3.77–5.28)
RDW: 12.8 % (ref 11.7–15.4)
WBC: 3.8 x10E3/uL (ref 3.4–10.8)

## 2024-02-01 ENCOUNTER — Ambulatory Visit: Payer: Self-pay

## 2024-02-04 ENCOUNTER — Encounter: Payer: Self-pay | Admitting: Hematology

## 2024-02-04 ENCOUNTER — Telehealth: Payer: Self-pay

## 2024-02-04 NOTE — Telephone Encounter (Signed)
 Called pt about schedule change to her procedure. Pt agreed to 11:30 procedure time and coming in at 9:30 on August 7th and not 5:30.

## 2024-02-06 ENCOUNTER — Telehealth (HOSPITAL_COMMUNITY): Payer: Self-pay

## 2024-02-06 ENCOUNTER — Telehealth: Payer: Self-pay

## 2024-02-06 NOTE — Telephone Encounter (Signed)
 Patient returned call to discuss upcoming procedure.   Confirmed patient is scheduled for a ICD Generator Removal on Thursday, August 7 with Dr. Danelle Birmingham. Instructed patient to arrive at the Main Entrance A at [facility]: [address] [place], [place] [address] and check in at Admitting at 9:30 AM.   Labs completed  Any recent signs of acute illness or been started on antibiotics? No  Any new medications started? No Any medications to hold? Hold your Eliquis  2 days prior to procedure- last dose will be August 04. Medication instructions:  On the morning of your procedure DO NOT take any medication. No eating or drinking after midnight prior to procedure.   The night before your procedure and the morning of your procedure, wash thoroughly with the CHG surgical soap from the neck down, paying special attention to the area where your procedure will be performed.  Advised of plan to go home the same day and will only stay overnight if medically necessary. You MUST have a responsible adult to drive you home and MUST be with you the first 24 hours after you arrive home.  Patient verbalized understanding to all instructions provided and agreed to proceed with procedure.

## 2024-02-06 NOTE — Telephone Encounter (Signed)
 Attempted to reach patient to discuss upcoming procedure, no answer. Left VM for patient to return call.

## 2024-02-06 NOTE — Telephone Encounter (Signed)
 Called pt to inform her of having to move her procedure with Dr. Waddell. She has been moved from 8/7 to 9/5 at 7:30 AM.  She will need updated labs but will be having this done at the [facility]  I will send updated Instructions via MyChart per pt's request.

## 2024-02-11 DIAGNOSIS — E782 Mixed hyperlipidemia: Secondary | ICD-10-CM | POA: Diagnosis not present

## 2024-02-11 DIAGNOSIS — N182 Chronic kidney disease, stage 2 (mild): Secondary | ICD-10-CM | POA: Diagnosis not present

## 2024-02-11 DIAGNOSIS — Z6823 Body mass index (BMI) 23.0-23.9, adult: Secondary | ICD-10-CM | POA: Diagnosis not present

## 2024-02-11 DIAGNOSIS — I700 Atherosclerosis of aorta: Secondary | ICD-10-CM | POA: Diagnosis not present

## 2024-02-11 DIAGNOSIS — F339 Major depressive disorder, recurrent, unspecified: Secondary | ICD-10-CM | POA: Diagnosis not present

## 2024-02-11 DIAGNOSIS — I2510 Atherosclerotic heart disease of native coronary artery without angina pectoris: Secondary | ICD-10-CM | POA: Diagnosis not present

## 2024-02-13 ENCOUNTER — Inpatient Hospital Stay

## 2024-02-13 ENCOUNTER — Inpatient Hospital Stay: Attending: Hematology

## 2024-02-13 ENCOUNTER — Inpatient Hospital Stay (HOSPITAL_BASED_OUTPATIENT_CLINIC_OR_DEPARTMENT_OTHER): Admitting: Hematology

## 2024-02-13 ENCOUNTER — Ambulatory Visit (HOSPITAL_COMMUNITY)
Admission: RE | Admit: 2024-02-13 | Discharge: 2024-02-13 | Disposition: A | Discharge status: Home / Self Care | Source: Ambulatory Visit | Attending: Physician Assistant | Admitting: Physician Assistant

## 2024-02-13 VITALS — BP 119/66 | HR 73 | Temp 97.9°F | Resp 18 | Wt 153.5 lb

## 2024-02-13 DIAGNOSIS — Z7189 Other specified counseling: Secondary | ICD-10-CM

## 2024-02-13 DIAGNOSIS — C3411 Malignant neoplasm of upper lobe, right bronchus or lung: Secondary | ICD-10-CM | POA: Insufficient documentation

## 2024-02-13 DIAGNOSIS — I700 Atherosclerosis of aorta: Secondary | ICD-10-CM | POA: Diagnosis not present

## 2024-02-13 DIAGNOSIS — Z5112 Encounter for antineoplastic immunotherapy: Secondary | ICD-10-CM | POA: Insufficient documentation

## 2024-02-13 DIAGNOSIS — C3491 Malignant neoplasm of unspecified part of right bronchus or lung: Secondary | ICD-10-CM

## 2024-02-13 DIAGNOSIS — Z87891 Personal history of nicotine dependence: Secondary | ICD-10-CM | POA: Diagnosis not present

## 2024-02-13 DIAGNOSIS — Z95828 Presence of other vascular implants and grafts: Secondary | ICD-10-CM

## 2024-02-13 DIAGNOSIS — R590 Localized enlarged lymph nodes: Secondary | ICD-10-CM | POA: Diagnosis not present

## 2024-02-13 DIAGNOSIS — Z79899 Other long term (current) drug therapy: Secondary | ICD-10-CM | POA: Insufficient documentation

## 2024-02-13 DIAGNOSIS — R918 Other nonspecific abnormal finding of lung field: Secondary | ICD-10-CM | POA: Diagnosis not present

## 2024-02-13 LAB — CMP (CANCER CENTER ONLY)
ALT: 12 U/L (ref 0–44)
AST: 17 U/L (ref 15–41)
Albumin: 4.1 g/dL (ref 3.5–5.0)
Alkaline Phosphatase: 82 U/L (ref 38–126)
Anion gap: 5 (ref 5–15)
BUN: 13 mg/dL (ref 8–23)
CO2: 27 mmol/L (ref 22–32)
Calcium: 8.8 mg/dL — ABNORMAL LOW (ref 8.9–10.3)
Chloride: 110 mmol/L (ref 98–111)
Creatinine: 0.6 mg/dL (ref 0.44–1.00)
GFR, Estimated: >60 mL/min (ref >60)
Glucose, Bld: 98 mg/dL (ref 70–99)
Potassium: 3.6 mmol/L (ref 3.5–5.1)
Sodium: 142 mmol/L (ref 135–145)
Total Bilirubin: 0.4 mg/dL (ref 0.0–1.2)
Total Protein: 6.3 g/dL — ABNORMAL LOW (ref 6.5–8.1)

## 2024-02-13 LAB — CBC WITH DIFFERENTIAL (CANCER CENTER ONLY)
Abs Immature Granulocytes: 0.01 K/uL (ref 0.00–0.07)
Basophils Absolute: 0 K/uL (ref 0.0–0.1)
Basophils Relative: 0 %
Eosinophils Absolute: 0 K/uL (ref 0.0–0.5)
Eosinophils Relative: 1 %
HCT: 37.9 % (ref 36.0–46.0)
Hemoglobin: 13.2 g/dL (ref 12.0–15.0)
Immature Granulocytes: 0 %
Lymphocytes Relative: 17 %
Lymphs Abs: 0.5 K/uL — ABNORMAL LOW (ref 0.7–4.0)
MCH: 31.2 pg (ref 26.0–34.0)
MCHC: 34.8 g/dL (ref 30.0–36.0)
MCV: 89.6 fL (ref 80.0–100.0)
Monocytes Absolute: 0.3 K/uL (ref 0.1–1.0)
Monocytes Relative: 11 %
Neutro Abs: 1.9 K/uL (ref 1.7–7.7)
Neutrophils Relative %: 71 %
Platelet Count: 76 K/uL — ABNORMAL LOW (ref 150–400)
RBC: 4.23 MIL/uL (ref 3.87–5.11)
RDW: 13.9 % (ref 11.5–15.5)
WBC Count: 2.7 K/uL — ABNORMAL LOW (ref 4.0–10.5)
nRBC: 0 % (ref 0.0–0.2)

## 2024-02-13 MED ORDER — SODIUM CHLORIDE 0.9 % IV SOLN: INTRAVENOUS | Status: DC

## 2024-02-13 MED ORDER — HEPARIN SOD (PORK) LOCK FLUSH 100 UNIT/ML IV SOLN
500.0000 [IU] | Freq: Once | INTRAVENOUS | Status: AC
  Administered 2024-02-13: 500 [IU] via INTRAVENOUS

## 2024-02-13 MED ORDER — IOHEXOL 300 MG/ML  SOLN
75.0000 mL | Freq: Once | INTRAMUSCULAR | Status: AC | PRN
  Administered 2024-02-13: 75 mL via INTRAVENOUS

## 2024-02-13 MED ORDER — SODIUM CHLORIDE 0.9 % IV SOLN
10.0000 mg/kg | Freq: Once | INTRAVENOUS | Status: AC
  Administered 2024-02-13: 740 mg via INTRAVENOUS
  Filled 2024-02-13: qty 4.8

## 2024-02-13 MED ORDER — SODIUM CHLORIDE 0.9% FLUSH
10.0000 mL | Freq: Once | INTRAVENOUS | Status: AC
  Administered 2024-02-13: 10 mL

## 2024-02-13 NOTE — Patient Instructions (Signed)
 [facility] - A DEPT OF [facility]  Discharge Instructions: Thank you for choosing [facility] to provide your oncology and hematology care.   If you have a lab appointment with the Cancer Center, please go directly to the Cancer Center and check in at the registration area.   Wear comfortable clothing and clothing appropriate for easy access to any Portacath or PICC line.   We strive to give you quality time with your provider. You may need to reschedule your appointment if you arrive late (15 or more minutes).  Arriving late affects you and other patients whose appointments are after yours.  Also, if you miss three or more appointments without notifying the office, you may be dismissed from the clinic at the provider's discretion.      For prescription refill requests, have your pharmacy contact our office and allow 72 hours for refills to be completed.    Today you received the following chemotherapy and/or immunotherapy agents: Durvalumab       To help prevent nausea and vomiting after your treatment, we encourage you to take your nausea medication as directed.  BELOW ARE SYMPTOMS THAT SHOULD BE REPORTED IMMEDIATELY: *FEVER GREATER THAN 100.4 F (38 C) OR HIGHER *CHILLS OR SWEATING *NAUSEA AND VOMITING THAT IS NOT CONTROLLED WITH YOUR NAUSEA MEDICATION *UNUSUAL SHORTNESS OF BREATH *UNUSUAL BRUISING OR BLEEDING *URINARY PROBLEMS (pain or burning when urinating, or frequent urination) *BOWEL PROBLEMS (unusual diarrhea, constipation, pain near the anus) TENDERNESS IN MOUTH AND THROAT WITH OR WITHOUT PRESENCE OF ULCERS (sore throat, sores in mouth, or a toothache) UNUSUAL RASH, SWELLING OR PAIN  UNUSUAL VAGINAL DISCHARGE OR ITCHING   Items with * indicate a potential emergency and should be followed up as soon as possible or go to the Emergency Department if any problems should occur.  Please show the CHEMOTHERAPY ALERT CARD or  IMMUNOTHERAPY ALERT CARD at check-in to the Emergency Department and triage nurse.  Should you have questions after your visit or need to cancel or reschedule your appointment, please contact [facility] - A DEPT OF [facility]  Dept: [phone]  and follow the prompts.  Office hours are 8:00 a.m. to 4:30 p.m. Monday - Friday. Please note that voicemails left after 4:00 p.m. may not be returned until the following business day.  We are closed weekends and major holidays. You have access to a nurse at all times for urgent questions. Please call the main number to the clinic Dept: [phone] and follow the prompts.   For any non-urgent questions, you may also contact your provider using MyChart. We now offer e-Visits for anyone 18 and older to request care online for non-urgent symptoms. For details visit [email or web address].   Also download the MyChart app! Go to the app store, search "MyChart", open the app, select [facility], and log in with your MyChart username and password.

## 2024-02-19 ENCOUNTER — Encounter: Payer: Self-pay | Admitting: Hematology

## 2024-02-19 NOTE — Progress Notes (Signed)
 Patient's chart and cardiac records reviewed with Dr Lucious and due to patient having an AICD, she will need to be moved to Main OR even though she was scheduled as a LOCAL case. Sherri at Dr Jari office notified.

## 2024-02-19 NOTE — Progress Notes (Signed)
 HEMATOLOGY/ONCOLOGY CLINIC NOTE  Date of Service:.02/13/2024  Patient Care Team: O'Buch, Glenis RIGGERS as PCP - General (Internal Medicine) Waddell Danelle ORN, MD as PCP - Cardiology (Cardiology) Harrietta Avelina LABOR, MD (Family Medicine) Onesimo Emaline Brink, MD as Consulting Physician (Hematology)  CHIEF COMPLAINTS/PURPOSE OF CONSULTATION:  Management of recently diagnosed non small cel lung cancer Hx of DLBCL   HISTORY OF PRESENTING ILLNESS:  See previous notes for details on initial presentation  INTERVAL HISTORY:  Rachel Bates is a 78 y.o. female who presents today for follow up and management of squamous cell carcinoma of the lung.    Patient notes she is doing well and has no acute new concerns since her last clinic visit.  No new skin rashes no diarrhea.  No new chest pain or shortness of breath. She feels that she is tolerating her immunotherapy well without any acute issues. She is scheduled for her carpal tunnel release surgery on 02/26/2024 and ICD generator removal on 03/14/2024. We discussed that we will keep the durvalumab  every 2 weeks till she is done with all her surgical procedures and then may decide to go to monthly dosing. She will be having her CT of the chest later today  MEDICAL HISTORY:  Past Medical History:  Diagnosis Date   AICD (automatic cardioverter/defibrillator) present dual   Medtronic ---  original placedment 2007/  generator change 2014 by dr danelle taylor   Anemia    Anticoagulant long-term use    eliquis    Anxiety    Arthralgia of multiple joints    Arthritis pain    Benign hypertensive heart disease    CAD (coronary artery disease) primary cardiologist-- dr danelle taylor   MI and 2 stents 1995 in [place]    Cancer [facility])    CHF NYHA class II, chronic, systolic (HCC)    followed by dr danelle taylor   Degenerative disc disease, lumbar    Depression    Dyslipidemia    History of basal cell carcinoma (BCC) excision    right ankle  area s/p  excision in office 06/ 2019  in office   History of DVT of lower extremity yrs ago before 2012   History of MI (myocardial infarction) 1995  in [place]   History of pulmonary embolus (PE) 2012   History of ventricular tachycardia    Hyperlipidemia    Hypersomnia    Hypertension    Ischemic cardiomyopathy    Migraines    Myocardial infarction (HCC)    OA (osteoarthritis)    all over   PAF (paroxysmal atrial fibrillation) (HCC)    Pneumonia    02/2023   Primary localized osteoarthritis of right hip 12/18/2017   Primary localized osteoarthritis of right knee 05/28/2018   S/P coronary artery stent placement 1995   in [facility]   05-21-2018 per pt x2  stents in same coronary artery (unsure BM or DES)   Vitamin D  deficiency disease     SURGICAL HISTORY: Past Surgical History:  Procedure Laterality Date   BIOPSY  06/19/2022   Procedure: BIOPSY;  Surgeon: Elicia Claw, MD;  [facility];  Service: Gastroenterology;;   BRONCHIAL BIOPSY  08/07/2023   Procedure: BRONCHIAL BIOPSIES;  Surgeon: Shelah Lamar RAMAN, MD;  Location: [facility] ENDOSCOPY;  Service: Pulmonary;;   BRONCHIAL BRUSHINGS  08/07/2023   Procedure: BRONCHIAL BRUSHINGS;  Surgeon: Shelah Lamar RAMAN, MD;  Location: [facility] ENDOSCOPY;  Service: Pulmonary;;   BRONCHIAL NEEDLE ASPIRATION BIOPSY  08/07/2023   Procedure: BRONCHIAL NEEDLE  ASPIRATION BIOPSIES;  Surgeon: Shelah Lamar RAMAN, MD;  Location: [facility] ENDOSCOPY;  Service: Pulmonary;;   CARDIAC DEFIBRILLATOR PLACEMENT  2007   CATARACT EXTRACTION, BILATERAL     COLONOSCOPY  04/14/2013   colonic polyp, status post polypectomy. Mild panocolonic diverticulosis. Small internal hemorrhoids   DILATION AND CURETTAGE OF UTERUS  yrs ago   DIRECT LARYNGOSCOPY Right 10/04/2022   Procedure: DIRECT LARYNGOSCOPY;  Surgeon: Llewellyn Gerard LABOR, DO;  [facility];  Service: ENT;  Laterality: Right;   ESOPHAGOGASTRODUODENOSCOPY (EGD) WITH PROPOFOL  N/A 06/19/2022   Procedure:  ESOPHAGOGASTRODUODENOSCOPY (EGD) WITH PROPOFOL ;  Surgeon: Elicia Claw, MD;  [facility];  Service: Gastroenterology;  Laterality: N/A;   FIDUCIAL MARKER PLACEMENT  08/07/2023   Procedure: FIDUCIAL MARKER PLACEMENT;  Surgeon: Shelah Lamar RAMAN, MD;  Location: [facility] ENDOSCOPY;  Service: Pulmonary;;   IMPACTION REMOVAL  06/19/2022   Procedure: IMPACTION REMOVAL;  Surgeon: Elicia Claw, MD;  [facility];  Service: Gastroenterology;;   IMPLANTABLE CARDIOVERTER DEFIBRILLATOR GENERATOR CHANGE N/A 02/04/2013   Procedure: IMPLANTABLE CARDIOVERTER DEFIBRILLATOR GENERATOR CHANGE;  Surgeon: Danelle LELON Birmingham, MD;  Location: [facility] C[facility] LAB;  Service: Cardiovascular;  Laterality: N/A;   IR IMAGING GUIDED PORT INSERTION  10/27/2022   LYMPH NODE BIOPSY Right 10/04/2022   Procedure: EXCISIONAL OF RIGHT DEEP CERVICAL LYMPH NODE;  Surgeon: Llewellyn Gerard LABOR, DO;  Location[facility];  Service: ENT;  Laterality: Right;   SHOULDER ARTHROSCOPY Right 2015   TOTAL HIP ARTHROPLASTY Right 12/18/2017   Procedure: RIGHT TOTAL HIP ARTHROPLASTY;  Surgeon: Josefina Chew, MD;  Location: [facility];  Service: Orthopedics;  Laterality: Right;   TOTAL HIP ARTHROPLASTY Left 04/19/2021   Procedure: TOTAL HIP ARTHROPLASTY;  Surgeon: Josefina Chew, MD;  [facility];  Service: Orthopedics;  Laterality: Left;   TOTAL KNEE ARTHROPLASTY Right 05/28/2018   Procedure: TOTAL KNEE ARTHROPLASTY;  Surgeon: Josefina Chew, MD;  [facility];  Service: Orthopedics;  Laterality: Right;  Adductor Block   TOTAL KNEE ARTHROPLASTY Left 08/12/2021   Procedure: TOTAL KNEE ARTHROPLASTY;  Surgeon: Josefina Chew, MD;  [facility];  Service: Orthopedics;  Laterality: Left;   TUBAL LIGATION Bilateral yrs ago   VIDEO BRONCHOSCOPY WITH RADIAL ENDOBRONCHIAL ULTRASOUND  08/07/2023   Procedure: VIDEO BRONCHOSCOPY WITH RADIAL ENDOBRONCHIAL ULTRASOUND;  Surgeon: Shelah Lamar RAMAN, MD;  [facility];  Service: Pulmonary;;   WISDOM  TOOTH EXTRACTION      SOCIAL HISTORY: Social History   Socioeconomic History   Marital status: Single    Spouse name: Not on file   Number of children: 0   Years of education: 14   Highest education level: Not on file  Occupational History   Not on file  Tobacco Use   Smoking status: Former    Current packs/day: 0.00    Types: Cigarettes    Start date: 05/21/1966    Quit date: 05/21/1996    Years since quitting: 27.7   Smokeless tobacco: Never  Vaping Use   Vaping status: Never Used  Substance and Sexual Activity   Alcohol use: No   Drug use: Never   Sexual activity: Not on file  Other Topics Concern   Not on file  Social History Narrative   Not on file   Social Drivers of Health   Financial Resource Strain: Low Risk  (05/28/2018)   Overall Financial Resource Strain (CARDIA)    Difficulty of Paying Living Expenses: Not hard at all  Food Insecurity: No Food Insecurity (08/28/2023)   Hunger Vital Sign  Worried About Programme researcher, broadcasting/film/video in the Last Year: Never true    Ran Out of Food in the Last Year: Never true  Transportation Needs: No Transportation Needs (08/28/2023)   PRAPARE - Administrator, Civil Service (Medical): No    Lack of Transportation (Non-Medical): No  Physical Activity: Not on file  Stress: Not on file  Social Connections: Not on file  Intimate Partner Violence: Not At Risk (08/28/2023)   Humiliation, Afraid, Rape, and Kick questionnaire    Fear of Current or Ex-Partner: No    Emotionally Abused: No    Physically Abused: No    Sexually Abused: No    FAMILY HISTORY: Family History  Problem Relation Age of Onset   Hypertension Mother    Thyroid  disease Mother    Alzheimer's disease Mother    Coronary artery disease Father    Pulmonary embolism Father    Congestive Heart Failure Maternal Grandmother    Hypertension Maternal Grandmother    Heart attack Maternal Grandfather    Other Maternal Grandfather        carotid disease    Dementia Paternal Grandmother    Other Paternal Grandfather 8       accident    ALLERGIES:  is allergic to sumatriptan succinate and amitriptyline.  MEDICATIONS:  Current Outpatient Medications  Medication Sig Dispense Refill   acetaminophen  (TYLENOL ) 650 MG CR tablet Take 1,300 mg by mouth every 8 (eight) hours as needed for pain.     b complex vitamins capsule Take 1 capsule by mouth daily.     Calcium  Carb-Cholecalciferol  (CALCIUM  600 + D PO) Take 1 tablet by mouth daily.     carvedilol  (COREG ) 12.5 MG tablet Take 12.5 mg by mouth 2 (two) times daily with a meal.      Cholecalciferol  (VITAMIN D3) 125 MCG (5000 UT) capsule Take 5,000 Units by mouth daily.     cyanocobalamin  (VITAMIN B12) 1000 MCG tablet Take 1,000 mcg by mouth daily.     ELIQUIS  5 MG TABS tablet TAKE 1 TABLET BY MOUTH TWICE A DAY 60 tablet 5   FIBER PO Take 1 capsule by mouth 2 (two) times daily.     Fiber POWD Take 1 Scoop by mouth daily.     gabapentin  (NEURONTIN ) 300 MG capsule Take 300 mg by mouth 3 (three) times daily.     lidocaine  (XYLOCAINE ) 2 % solution Use as directed 15 mLs in the mouth or throat as needed for mouth pain. 100 mL 0   lidocaine -prilocaine  (EMLA ) cream Apply to affected area once 30 g 3   loratadine  (CLARITIN ) 10 MG tablet Take 10 mg by mouth daily.     Omega-3 Fatty Acids (FISH OIL) 1000 MG CAPS Take 1,000 mg by mouth daily.      prochlorperazine  (COMPAZINE ) 10 MG tablet Take 1 tablet (10 mg total) by mouth every 6 (six) hours as needed for nausea or vomiting. 30 tablet 1   ramipril  (ALTACE ) 2.5 MG tablet Take 2.5 mg by mouth daily.       simvastatin  (ZOCOR ) 40 MG tablet Take 40 mg by mouth at bedtime.       tiZANidine  (ZANAFLEX ) 4 MG tablet Take 2 mg by mouth daily as needed for muscle spasms.     traZODone  (DESYREL ) 50 MG tablet Take 50 mg by mouth at bedtime.      venlafaxine  (EFFEXOR ) 75 MG tablet Take 75 mg by mouth daily.     vitamin E  400 UNIT  capsule Take 400 Units by mouth daily.      zonisamide  (ZONEGRAN ) 50 MG capsule Take 50-100 mg by mouth See admin instructions. Take 50mg  by mouth in the morning and 100mg  at night.     magic mouthwash w/lidocaine  SOLN Take 5 mLs by mouth 4 (four) times daily as needed for mouth pain. Suspension contains equal amounts of Maalox Extra Strength, nystatin , diphenhydramine  and lidocaine . (Patient not taking: Reported on 02/13/2024) 400 mL 0   nystatin  (MYCOSTATIN ) 100000 UNIT/ML suspension Take 5 mLs (500,000 Units total) by mouth 4 (four) times daily. (Patient not taking: Reported on 02/13/2024) 100 mL 0   ondansetron  (ZOFRAN ) 8 MG tablet Take 1 tablet (8 mg total) by mouth every 8 (eight) hours as needed for nausea or vomiting. (Patient not taking: Reported on 02/13/2024) 30 tablet 1   sucralfate  (CARAFATE ) 1 g tablet TAKE 1 TABLET (1 G TOTAL) BY MOUTH 4 (FOUR) TIMES DAILY - WITH MEALS AND AT BEDTIME. DISSOLVE TAB IN 5-10 ML OF WATER  TO MAKE A THICK SLURRY AND THEN SWALLOW IT. (Patient not taking: Reported on 02/13/2024) 270 tablet 1   No current facility-administered medications for this visit.    REVIEW OF SYSTEMS:   .10 Point review of Systems was done is negative except as noted above.  PHYSICAL EXAMINATION: .BP 119/66   Pulse 73   Temp 97.9 F (36.6 C)   Resp 18   Wt 153 lb 8 oz (69.6 kg)   SpO2 94%   BMI 24.04 kg/m  . GENERAL:alert, in no acute distress and comfortable SKIN: no acute rashes, no significant lesions EYES: conjunctiva are pink and non-injected, sclera anicteric OROPHARYNX: MMM, no exudates, no oropharyngeal erythema or ulceration NECK: supple, no JVD LYMPH:  no palpable lymphadenopathy in the cervical, axillary or inguinal regions LUNGS: clear to auscultation b/l with normal respiratory effort HEART: regular rate & rhythm ABDOMEN:  normoactive bowel sounds , non tender, not distended. Extremity: no pedal edema PSYCH: alert & oriented x 3 with fluent speech NEURO: no focal motor/sensory deficits   LABORATORY  DATA:  I have reviewed the data as listed    Latest Ref Rng & Units 02/13/2024    8:30 AM 01/30/2024   10:19 AM 01/29/2024    9:47 AM  CBC  WBC 4.0 - 10.5 K/uL 2.7  3.8  3.8   Hemoglobin 12.0 - 15.0 g/dL 86.7  85.8  85.4   Hematocrit 36.0 - 46.0 % 37.9  42.8  42.5   Platelets 150 - 400 K/uL 76  102  92       Latest Ref Rng & Units 02/13/2024    8:30 AM 01/30/2024   10:18 AM 01/29/2024    9:47 AM  CMP  Glucose 70 - 99 mg/dL 98  86  91   BUN 8 - 23 mg/dL 13  17  15    Creatinine 0.44 - 1.00 mg/dL 9.39  9.40  9.34   Sodium 135 - 145 mmol/L 142  138  140   Potassium 3.5 - 5.1 mmol/L 3.6  4.2  4.0   Chloride 98 - 111 mmol/L 110  101  106   CO2 22 - 32 mmol/L 27  21  29    Calcium  8.9 - 10.3 mg/dL 8.8  9.5  9.5   Total Protein 6.5 - 8.1 g/dL 6.3   6.9   Total Bilirubin 0.0 - 1.2 mg/dL 0.4   0.6   Alkaline Phos 38 - 126 U/L 82   84  AST 15 - 41 U/L 17   19   ALT 0 - 44 U/L 12   13    Lab Results  Component Value Date   LDH 166 12/10/2023    -01/16/2024 Guardant 360:                 Molecular Pathology 10/21/2022:    Surgical Pathology 10/04/22: A. LYMPH NODE, RIGHT LEVEL 2 DEEP CERVICAL, EXCISION:  -Diffuse large B-cell lymphoma  -See comment COMMENT: The sections show diffuse effacement of the lymph nodal architecture primarily by a population of large lymphoid cells characterized by vesicular chromatin and small nucleoli associated with apoptosis and brisk mitosis.  In some areas, the large atypical lymphoid proliferation extends into the perinodal adipose tissue.  In this background, there are scattered variably sized and somewhat disrupted aggregates of primarily small lymphoid cells characterized by high nuclear cytoplasmic ratio, angulated nuclear contours and small to inconspicuous nucleoli. Flow cytometric analysis was performed [facility] [id number]) and shows a monoclonal, lambda restricted B-cell population expressing CD10.  In addition, immunohistochemical  stains for CD3, CD5, CD10, CD20, PAX5, BCL6, Bcl-2, Ki-67, CD30, CD138, CD21, EBV in addition to in situ hybridization for kappa and lambda were performed with appropriate controls.  The large lymphoid cells are positive for CD20, PAX5, CD10, BCL6, Bcl-2, and partially for cytoplasmic lambda.  No significant staining is seen with EBV, CD30, CD138 or cytoplasmic kappa.  Ki67 shows variably increased expression (more than 50% in some areas). CD21 highlights scattered somewhat disrupted follicular dendritic networks. The lymphoid aggregates of primarily small lymphoid cells previously described show positivity for B-cell markers CD20 and PAX5 in addition to CD10 and Bcl-2.  There is an admixed variable T-cell component in the background as seen with CD3 and CD5 and there is no apparent co-expression of CD5 in B-cell areas.  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    RADIOGRAPHIC STUDIES: I have personally reviewed the radiological images as listed and agreed with the findings in the report.   NM PET 09/04/2022: IMPRESSION: 1. There is a large tracer avid mass centered within the left base of tongue and left lingual region which extends into the hypopharyngeal region ventral to the epiglottis. Imaging findings are compatible with a primary head and neck malignancy. 2. A smaller focus of increased uptake localizes to the right pharyngeal tonsil region. Indeterminate. 3. Bilateral tracer avid cervical and supraclavicular lymph nodes compatible with nodal metastasis. 4. There is a 7 mm tracer avid nodule within the anteromedial left upper lobe which is suspicious for pulmonary metastasis. 5. There is increased uptake identified along the long axis of the posterior right seventh rib. No corresponding lytic or sclerotic changes identified on the CT  images. Cannot exclude bone metastases. 6. Multiple prominent retroperitoneal and mesenteric lymph nodes are identified which exhibit mild tracer uptake. This is a nonspecific finding and may reflect reactive adenopathy. Metastatic adenopathy cannot be excluded. Attention on future surveillance imaging is advised. 7.  Aortic Atherosclerosis (ICD10-I70.0).   ASSESSMENT & PLAN:   78 y.o.  female with:   1. 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 2. 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  PLAN:  -Discussed lab results from 02/13/2024 in details as noted above CBC and CMP stable Patient notes no new toxicities from current current durvalumab  immunotherapy.  No skin rashes no diarrhea no other acute new focal symptoms. Patient has no clinical symptoms suggestive of lung cancer progression at this time. She has a CT of the chest planned for later today which we shall follow-up. -She will be having her carpal tunnel release procedure later this month and her ICD pacemaker generator removal in the first week of September. - Further treatment based on CT chest results.  If there is any sign of progression depending on the extent of progression might consider additional palliative radiation alongside immunotherapy versus switching to treatment for metastatic lung cancer with carboplatin  plus Taxol  plus immunotherapy.  FOLLOW-UP: Per integrated scheduling continue Durvalumab  every 2 weeks To follow-up on CT of the chest  The total time spent in the appointment was 30 minutes*.  All of the patient's questions were  answered with apparent satisfaction. The patient knows to call the clinic with any problems, questions or concerns.   Emaline Saran MD MS AAHIVMS [facility] Hematology/Oncology Physician [facility]  .*Total Encounter Time as defined by the Centers for Medicare and Medicaid Services includes, in addition to the face-to-face time of a patient visit (documented in the note above) non-face-to-face time: obtaining and reviewing outside history, ordering and reviewing medications, tests or procedures, care coordination (communications with other health care professionals or caregivers) and documentation in the medical record.

## 2024-02-23 ENCOUNTER — Other Ambulatory Visit: Payer: Self-pay

## 2024-02-23 ENCOUNTER — Encounter (HOSPITAL_COMMUNITY): Payer: Self-pay | Admitting: Orthopedic Surgery

## 2024-02-23 NOTE — Progress Notes (Signed)
 SDW call  Patient was given pre-op instructions over the phone. Patient verbalized understanding of instructions provided.     PCP - Glenis Etienne, PA-C EP Cardiologist - Dr. Danelle Birmingham, LOV, 01/30/2024, Clearance 12/27/2023 Pulmonary:  Oncologist: Dr. Emaline Saran  PPM/ICD - ICD, Medtronic Device Orders - requested Rep Notified - na   Chest x-ray - 08/07/2023 EKG -  01/30/2024 Stress Test -  ECHO - 02/15/2023 Cardiac Cath - 1955 in [place] PFT: 08/27/2023  Sleep Study/sleep apnea/CPAP: denies  Non-diabetic   Blood Thinner Instructions: Eliquis , hold 1 day, last dose 02/24/2024 Aspirin  Instructions: denies   ERAS Protcol - Clears until 0630   Anesthesia review: Yes. A-Fib, CHF, CAD, HTN, MI, PE, HLD, ICD   Patient denies shortness of breath, fever, cough and chest pain over the phone call  Your procedure is scheduled on Tuesday February 26, 2024  Report to [facility] Main Entrance A at  0700  A.M., then check in with the Admitting office.  Call this number if you have problems the morning of surgery:  [phone]   If you have any questions prior to your surgery date call [phone]: Open Monday-Friday 8am-4pm If you experience any cold or flu symptoms such as cough, fever, chills, shortness of breath, etc. between now and your scheduled surgery, please notify us  at the above number    Remember:  Do not eat after midnight the night before your surgery  You may drink clear liquids until  0630   the morning of your surgery.   Clear liquids allowed are: Water , Non-Citrus Juices (without pulp), Carbonated Beverages, Clear Tea, Black Coffee ONLY (NO MILK, CREAM OR POWDERED CREAMER of any kind), and Gatorade   Take these medicines the morning of surgery with A SIP OF WATER :  Coreg , gabapentin , claritin , effexor , zonisamide   As needed: Tylenol , compazine , zanaflex   As of today, STOP taking any Aspirin  (unless otherwise instructed by your surgeon) Aleve, Naproxen, Ibuprofen,  Motrin, Advil, Goody's, BC's, all herbal medications, fish oil, and all vitamins.

## 2024-02-24 NOTE — Progress Notes (Signed)
 Anesthesia Chart Review: Rachel Bates  Case: [id number] Date/Time: 02/26/24 0918   Procedure: CARPAL TUNNEL RELEASE (Right)   Anesthesia type: Monitor Anesthesia Care   Diagnosis: Right carpal tunnel syndrome [G56.01]   Pre-op diagnosis: Right carpal tunnel syndrome   [facility] 08 / [facility]   Surgeons: Rachel Chew, MD       DISCUSSION: Patient is a 78 year old female scheduled for the above procedure. Case moved from [facility] to [facility] OR due to having an AICD. Of note, she is scheduled for ICD generator removal on 03/14/2024 (see below for more details).   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  Regarding difficult intubation: On 08/07/2023 she was intubated with 8.5 mm ETT using Glidescope and 3, IV induction, mask ventilation without difficulty. Grade 1 view. 1 attempt. Glidescope and 3 also used to place 6.5 mm ETT on 10/04/2022.  She follows with cardiology for history of CAD (s/p LCX stent 1995), PE (2012), HTN, ICM (s/p Medtronic ICD), PAF, NSVT (s/p ablation). Last seen by Dr. Waddell on 01/30/2024. ICD has reached ERI. He wrote, She is being treated for 2 malignancies, including squamous cell of the lung as well as diffuse large cell lymphoma. She has reached end of service on her ICD. Her EF was 50% and she has  never had therapies and has decided not to place a new device. Her current device bothers her and she would like to have the can removed.SABRASABRASABRAI discussed the treatment options and we will tentatively plan to hold off on ICD gen change. However she would like to have the can removed as it is bothering her. We will do so but not the leads. She understands that there is a small risk of infection from the procedure. She had been pacing 5%, but her device was reprogrammed to VVI 30/min and had not had any pacing.   She had previously had a preoperative cardiology APP evaluation/risk assessment 12/27/2023 with Rachel Hazy, NP, Given past medical history and time since last visit, based on ACC/AHA guidelines, Rachel Bates would be at acceptable risk for the planned procedure without further cardiovascular testing.    Her RCRI is moderate risk, 6.6% risk of major cardiac event.  She is able to complete greater than 4 METS of physical activity... Per office protocol, patient can hold Eliquis  for 1 day prior to her procedure. Please resume as soon as hemostasis is achieved. She will not need Lovenox  bridging surrounding procedure.  She follows with oncologist Dr. Onesimo for history of diffuse large B-cell lymphoma and non-small cell lung cancer. Last visit 02/13/24. He noted plans for upcoming carpal tunnel release and ICD generator extraction. He will keep her on durvalumab  every 2 weeks until she is done with her procedure and then consider monthly dosing. She had pending CT imaging at that time. He added, Further treatment based on CT chest results.  If there is any sign of progression depending on the extent of progression  might consider additional palliative radiation alongside immunotherapy versus switching to treatment for metastatic lung cancer with carboplatin  plus Taxol  plus immunotherapy.    She had CBC and CMP through [facility] on 8/6/25025.   As above, ICD at [facility] and is scheduled for removal next month.  ICD perioperative device form submitted to [facility] EP for recommendations, form still pending.   Last Eliquis  dose planned for 02/24/24.    VS: Ht 5' 7 (1.702 m)   Wt 68 kg   BMI 23.49 kg/m  BP Readings from Last 3 Encounters:  02/13/24 119/66  01/30/24 132/66  01/29/24 104/64   Pulse Readings from Last 3 Encounters:  02/13/24 73  01/30/24 79  01/29/24 80    PROVIDERS: Rachel Pock, PA-C is PCP  Rachel Lusher, MD is EP cardiologist Rachel Karst, MD is HEM-ONC Rachel Agent, MD is RAD-ONC   LABS: She had labs on 02/13/24. Last results in [facility] include: Lab Results  Component Value Date   WBC 2.7 (L) 02/13/2024   HGB 13.2 02/13/2024   HCT 37.9 02/13/2024   PLT 76 (L) 02/13/2024   GLUCOSE 98 02/13/2024   ALT 12 02/13/2024   AST 17 02/13/2024   NA 142 02/13/2024   K 3.6 02/13/2024   CL 110 02/13/2024   CREATININE 0.60 02/13/2024   BUN 13 02/13/2024   CO2 27 02/13/2024   TSH 1.740 01/29/2024   INR 1.1 04/19/2021    IMAGES: CT Chest 02/13/2024: IMPRESSION: 1. Continued decrease in size of a treated mass in the anterior perihilar right upper lobe with a biopsy marking clip measuring 1.8 x 0.9 cm, previously 2.3 x 1.1 cm. 2. Significant interval increase in relatively dense ground-glass airspace opacity throughout the perihilar right lung, particularly in the inferior right upper lobe, consistent with radiation pneumonitis. 3. Interval increase in size of nodules throughout the right apex. Unchanged clustered nodules in the peripheral posterior left upper lobe. 4. Interval enlargement of high right pretracheal, paratracheal, and supraclavicular nodes, previously FDG avid, consistent with worsened nodal metastatic disease. Similar appearance of matted, treated low pretracheal, subcarinal, and right hilar lymph nodes. 5. Overall mixed response to treatment. - Aortic Atherosclerosis (ICD10-I70.0).   CT Head 12/31/2023: IMPRESSION: 1. No change since the study of  February. No evidence of intracranial metastatic disease. 2. Redemonstration of an area of architectural distortion in the left frontal bone immediately above the frontal sinus. There is no evidence of change or progression since the study of February. No second skull abnormality is seen. I agree that this was not present in 2012. If this does represent a bone metastasis, it is indolent/nonprogressive over this 4 month follow-up. There was no hypermetabolism visible at this location at the time of the PET scan, and I do not think this is definitely a malignant lesion. Additional follow-up would seem prudent.  PET Scan 6/232025: IMPRESSION: - Previous right upper lobe perihilar hypermetabolic mass and the previously seen nodes in the right hilum in mid mediastinum appears smaller and show less uptake. However there are new hypermetabolic nodes more superiorly in the right side of the mediastinum including paratracheal region, thoracic inlet as well as in the low right neck, supraclavicular. - Left upper lobe nodular area with uptake is larger and shows more uptake today. - Overall mixed response. - No areas of abnormal uptake seen beneath the diaphragm.   EKG: 01/30/2024: Normal sinus rhythm with inferior MI, remote Cannot rule out Anterior infarct , age undetermined When compared with ECG of 14-Feb-2023 13:24,  No significant change since last tracing Confirmed by Rachel Bates [phone]) on 01/30/2024 10:21:18 AM   CV: Echo 02/15/2023: IMPRESSIONS:  1. Left ventricular ejection fraction, by estimation, is 50%. The left  ventricle has low normal function. The left ventricle demonstrates  regional wall motion abnormalities unchanged in location from prior study  on side by side review. Left ventricular  diastolic parameters are consistent with Grade I diastolic dysfunction  (impaired relaxation).   2. Right ventricular systolic function is normal. The right ventricular  size is  normal. There is mildly elevated pulmonary artery systolic  pressure. The estimated right ventricular systolic pressure is 42.9 mmHg.   3. The mitral valve is grossly normal. Trivial mitral valve  regurgitation. No evidence of mitral stenosis.   4. The aortic valve is normal in structure. Aortic valve regurgitation is  not visualized. No aortic stenosis is present.   5. The inferior vena cava is dilated in size with <50% respiratory  variability, suggesting right atrial pressure of 15 mmHg.  - Conclusion(s)/Recommendation(s): No left ventricular mural or apical  thrombus/thrombi.     Normal carotid artery US  on 04/18/2013.   Cardiac cath [facility], scanned under Media tab, Correspondence 04/11/2006): IMPRESSION: No significant CAD. There is 20-30% in-stent restenosis of the LCX stent. LVEF 40% with mid-distal inferior wall hypokinesis. Continue medical therapy.   Past Medical History:  Diagnosis Date   AICD (automatic cardioverter/defibrillator) present dual   Medtronic ---  original placedment 2007/  generator change 2014 by dr Bates taylor   Anemia    Anticoagulant long-term use    eliquis    Anxiety    Arthralgia of multiple joints    Arthritis pain    Benign hypertensive heart disease    CAD (coronary artery disease) primary cardiologist-- dr Bates taylor   MI and 2 stents 1995 in [place]    Cancer [facility])    CHF NYHA class II, chronic, systolic (HCC)    followed by dr Bates taylor   Complication of anesthesia    Degenerative disc disease, lumbar    Depression    Difficult intubation    Dyslipidemia    History of basal cell carcinoma (BCC) excision    right ankle area s/p  excision in office 06/ 2019  in office   History of DVT of lower extremity yrs ago before 2012   History of MI (myocardial infarction) 1995  in [place]   History of pulmonary embolus (PE) 2012   History of ventricular tachycardia    Hyperlipidemia    Hypersomnia    Hypertension     Ischemic cardiomyopathy    Migraines    Myocardial infarction (HCC)    OA (osteoarthritis)    all over   PAF (paroxysmal atrial fibrillation) (HCC)    Pneumonia    02/2023   Primary localized osteoarthritis of right hip 12/18/2017   Primary localized osteoarthritis of right knee 05/28/2018   S/P coronary artery stent placement 1995   in [facility]   05-21-2018 per pt x2  stents in same coronary artery (unsure BM or DES)   Vitamin D  deficiency disease     Past Surgical History:  Procedure Laterality Date   BIOPSY  06/19/2022   Procedure: BIOPSY;  Surgeon: Elicia Claw, MD;  Location: [facility] ENDOSCOPY;  Service: Gastroenterology;;   BRONCHIAL BIOPSY  08/07/2023   Procedure: BRONCHIAL BIOPSIES;  Surgeon: Shelah Lamar RAMAN, MD;  Location: [facility] ENDOSCOPY;  Service: Pulmonary;;   BRONCHIAL BRUSHINGS  08/07/2023   Procedure: BRONCHIAL  BRUSHINGS;  Surgeon: Shelah Lamar RAMAN, MD;  Location: [facility] ENDOSCOPY;  Service: Pulmonary;;   BRONCHIAL NEEDLE ASPIRATION BIOPSY  08/07/2023   Procedure: BRONCHIAL NEEDLE ASPIRATION BIOPSIES;  Surgeon: Shelah Lamar RAMAN, MD;  Location: [facility] ENDOSCOPY;  Service: Pulmonary;;   CARDIAC DEFIBRILLATOR PLACEMENT  2007   CATARACT EXTRACTION, BILATERAL     COLONOSCOPY  04/14/2013   colonic polyp, status post polypectomy. Mild panocolonic diverticulosis. Small internal hemorrhoids   DILATION AND CURETTAGE OF UTERUS  yrs ago   DIRECT LARYNGOSCOPY Right 10/04/2022   Procedure: DIRECT LARYNGOSCOPY;  Surgeon: Llewellyn Gerard LABOR, DO;  Location[facility];  Service: ENT;  Laterality: Right;   ESOPHAGOGASTRODUODENOSCOPY (EGD) WITH PROPOFOL  N/A 06/19/2022   Procedure: ESOPHAGOGASTRODUODENOSCOPY (EGD) WITH PROPOFOL ;  Surgeon: Elicia Claw, MD;  Location: [facility];  Service: Gastroenterology;  Laterality: N/A;   FIDUCIAL MARKER PLACEMENT  08/07/2023   Procedure: FIDUCIAL MARKER PLACEMENT;  Surgeon: Shelah Lamar RAMAN, MD;  Location: [facility] ENDOSCOPY;  Service: Pulmonary;;   IMPACTION REMOVAL   06/19/2022   Procedure: IMPACTION REMOVAL;  Surgeon: Elicia Claw, MD;  [facility];  Service: Gastroenterology;;   IMPLANTABLE CARDIOVERTER DEFIBRILLATOR GENERATOR CHANGE N/A 02/04/2013   Procedure: IMPLANTABLE CARDIOVERTER DEFIBRILLATOR GENERATOR CHANGE;  Surgeon: Danelle LELON Birmingham, MD;  Location: [facility] CATH LAB;  Service: Cardiovascular;  Laterality: N/A;   IR IMAGING GUIDED PORT INSERTION  10/27/2022   LYMPH NODE BIOPSY Right 10/04/2022   Procedure: EXCISIONAL OF RIGHT DEEP CERVICAL LYMPH NODE;  Surgeon: Llewellyn Gerard LABOR, DO;  [facility];  Service: ENT;  Laterality: Right;   SHOULDER ARTHROSCOPY Right 2015   TOTAL HIP ARTHROPLASTY Right 12/18/2017   Procedure: RIGHT TOTAL HIP ARTHROPLASTY;  Surgeon: Rachel Chew, MD;  [facility];  Service: Orthopedics;  Laterality: Right;   TOTAL HIP ARTHROPLASTY Left 04/19/2021   Procedure: TOTAL HIP ARTHROPLASTY;  Surgeon: Rachel Chew, MD;  [facility] ORS;  Service: Orthopedics;  Laterality: Left;   TOTAL KNEE ARTHROPLASTY Right 05/28/2018   Procedure: TOTAL KNEE ARTHROPLASTY;  Surgeon: Rachel Chew, MD;  [facility];  Service: Orthopedics;  Laterality: Right;  Adductor Block   TOTAL KNEE ARTHROPLASTY Left 08/12/2021   Procedure: TOTAL KNEE ARTHROPLASTY;  Surgeon: Rachel Chew, MD;  Location[facility];  Service: Orthopedics;  Laterality: Left;   TUBAL LIGATION Bilateral yrs ago   VIDEO BRONCHOSCOPY WITH RADIAL ENDOBRONCHIAL ULTRASOUND  08/07/2023   Procedure: VIDEO BRONCHOSCOPY WITH RADIAL ENDOBRONCHIAL ULTRASOUND;  Surgeon: Shelah Lamar RAMAN, MD;  [facility];  Service: Pulmonary;;   WISDOM TOOTH EXTRACTION      MEDICATIONS: No current facility-administered medications for this encounter.    acetaminophen  (TYLENOL ) 650 MG CR tablet   b complex vitamins capsule   Calcium  Carb-Cholecalciferol  (CALCIUM  600 + D PO)   carvedilol  (COREG ) 12.5 MG tablet   Cholecalciferol  (VITAMIN D3) 125 MCG (5000 UT) capsule    cyanocobalamin  (VITAMIN B12) 1000 MCG tablet   ELIQUIS  5 MG TABS tablet   FIBER PO   gabapentin  (NEURONTIN ) 300 MG capsule   loratadine  (CLARITIN ) 10 MG tablet   Omega-3 Fatty Acids (FISH OIL) 1000 MG CAPS   prochlorperazine  (COMPAZINE ) 10 MG tablet   ramipril  (ALTACE ) 2.5 MG tablet   simvastatin  (ZOCOR ) 40 MG tablet   tiZANidine  (ZANAFLEX ) 4 MG tablet   traZODone  (DESYREL ) 50 MG tablet   venlafaxine  (EFFEXOR ) 75 MG tablet   vitamin E  400 UNIT capsule   zonisamide  (ZONEGRAN ) 50 MG capsule   lidocaine  (XYLOCAINE ) 2 % solution   magic mouthwash w/lidocaine  SOLN  nystatin  (MYCOSTATIN ) 100000 UNIT/ML suspension   ondansetron  (ZOFRAN ) 8 MG tablet   sucralfate  (CARAFATE ) 1 g tablet    Isaiah Ruder, PA-C Surgical Short Stay/Anesthesiology [facility] Phone [phone] [facility] Phone [phone] 02/24/2024 11:08 AM

## 2024-02-24 NOTE — Anesthesia Preprocedure Evaluation (Signed)
 Anesthesia Evaluation  Patient identified by MRN, date of birth, ID band Patient awake    Reviewed: Allergy & Precautions, NPO status , Patient's Chart, lab work & pertinent test results, reviewed documented beta blocker date and time   History of Anesthesia Complications (+) DIFFICULT AIRWAY and history of anesthetic complications  Airway Mallampati: II  TM Distance: >3 FB     Dental  (+) Edentulous Upper, Edentulous Lower   Pulmonary pneumonia, resolved, former smoker   breath sounds clear to auscultation       Cardiovascular hypertension, + CAD, + Past MI, + Cardiac Stents and +CHF  (-) CABG + Cardiac Defibrillator  Rhythm:Regular Rate:Normal  IMPRESSIONS     1. Left ventricular ejection fraction, by estimation, is 50%. The left  ventricle has low normal function. The left ventricle demonstrates  regional wall motion abnormalities unchanged in location from prior study  on side by side review. Left ventricular  diastolic parameters are consistent with Grade I diastolic dysfunction  (impaired relaxation).   2. Right ventricular systolic function is normal. The right ventricular  size is normal. There is mildly elevated pulmonary artery systolic  pressure. The estimated right ventricular systolic pressure is 42.9 mmHg.   3. The mitral valve is grossly normal. Trivial mitral valve  regurgitation. No evidence of mitral stenosis.   4. The aortic valve is normal in structure. Aortic valve regurgitation is  not visualized. No aortic stenosis is present.   5. The inferior vena cava is dilated in size with <50% respiratory  variability, suggesting right atrial pressure of 15 mmHg.      Neuro/Psych  Headaches PSYCHIATRIC DISORDERS Anxiety Depression       GI/Hepatic   Endo/Other    Renal/GU      Musculoskeletal  (+) Arthritis ,    Abdominal   Peds  Hematology  (+) Blood dyscrasia, anemia thrombocytopenia    Anesthesia Other Findings   Reproductive/Obstetrics                              Anesthesia Physical Anesthesia Plan  ASA: 3  Anesthesia Plan: MAC   Post-op Pain Management:    Induction: Intravenous  PONV Risk Score and Plan: 2 and Ondansetron  and Propofol  infusion  Airway Management Planned: Natural Airway and Simple Face Mask  Additional Equipment:   Intra-op Plan:   Post-operative Plan: Extubation in OR  Informed Consent: I have reviewed the patients History and Physical, chart, labs and discussed the procedure including the risks, benefits and alternatives for the proposed anesthesia with the patient or authorized representative who has indicated his/her understanding and acceptance.     Dental advisory given  Plan Discussed with: CRNA  Anesthesia Plan Comments: (See PAT note written 02/24/2024 by Jatasia Gundrum, PA-C.  )         Anesthesia Quick Evaluation

## 2024-02-25 ENCOUNTER — Encounter: Payer: Self-pay | Admitting: Internal Medicine

## 2024-02-25 NOTE — Progress Notes (Signed)
 PERIOPERATIVE PRESCRIPTION FOR IMPLANTED CARDIAC DEVICE PROGRAMMING  Patient Information: Name:  Rachel Bates  DOB:  13-Apr-1946  MRN:  [id number]    Planned Procedure:  Right carpal tunnel release  Surgeon:  Dr. Fonda Olmsted  Date of Procedure:  02/26/2024  Cautery will be used.  Position during surgery:  Supine   Device Information:  Clinic EP Physician:  Danelle Birmingham, MD   Device Type:  Defibrillator Manufacturer and Phone #:  Medtronic: [id number] Pacemaker Dependent?:  No. Date of Last Device Check:  02/01/24 Normal Device Function?:  No.- Device at known EOS (end of service) with planned explant of can and capping of leads on 03/14/24.   Electrophysiologist's Recommendations:  Have magnet available. Provide continuous ECG monitoring when magnet is used or reprogramming is to be performed.  Procedure should not interfere with device function.  No device programming or magnet placement needed.  Per Device Clinic Standing Orders, [name], [place]  9:47 AM 02/25/2024

## 2024-02-25 NOTE — H&P (Signed)
 PREOPERATIVE H&P  Chief Complaint: right wrist and hand pain  HPI: Rachel Bates is a 78 y.o. female with hx of stage III lung cancer with history of chemotherapy and radiation, hx of lymphoma, implanted defibrillator who presents for her right wrist and hand.  She has 6/10 pain that is moderate to severe and gets worse, particularly at nighttime.  She has difficulty gripping and twisting.  She tried using a splint, but it aggravated her thumb.  She has arthritic changes in the hand already.   This is significantly impairing activities of daily living.  She has elected for surgical management.   Past Medical History:  Diagnosis Date   AICD (automatic cardioverter/defibrillator) present dual   Medtronic ---  original placedment 2007/  generator change 2014 by dr danelle taylor   Anemia    Anticoagulant long-term use    eliquis    Anxiety    Arthralgia of multiple joints    Arthritis pain    Benign hypertensive heart disease    CAD (coronary artery disease) primary cardiologist-- dr danelle taylor   MI and 2 stents 1995 in [place]    Cancer [facility])    CHF NYHA class II, chronic, systolic (HCC)    followed by dr danelle taylor   Complication of anesthesia    Degenerative disc disease, lumbar    Depression    Difficult intubation    Dyslipidemia    History of basal cell carcinoma (BCC) excision    right ankle area s/p  excision in office 06/ 2019  in office   History of DVT of lower extremity yrs ago before 2012   History of MI (myocardial infarction) 1995  in [place]   History of pulmonary embolus (PE) 2012   History of ventricular tachycardia    Hyperlipidemia    Hypersomnia    Hypertension    Ischemic cardiomyopathy    Migraines    Myocardial infarction (HCC)    OA (osteoarthritis)    all over   PAF (paroxysmal atrial fibrillation) (HCC)    Pneumonia    02/2023   Primary localized osteoarthritis of right hip 12/18/2017   Primary localized osteoarthritis of right knee 05/28/2018    S/P coronary artery stent placement 1995   in [facility]   05-21-2018 per pt x2  stents in same coronary artery (unsure BM or DES)   Vitamin D  deficiency disease    Past Surgical History:  Procedure Laterality Date   BIOPSY  06/19/2022   Procedure: BIOPSY;  Surgeon: Elicia Claw, MD;  Location: [facility] ENDOSCOPY;  Service: Gastroenterology;;   BRONCHIAL BIOPSY  08/07/2023   Procedure: BRONCHIAL BIOPSIES;  Surgeon: Shelah Lamar RAMAN, MD;  Location: [facility] ENDOSCOPY;  Service: Pulmonary;;   BRONCHIAL BRUSHINGS  08/07/2023   Procedure: BRONCHIAL BRUSHINGS;  Surgeon: Shelah Lamar RAMAN, MD;  Location: [facility] ENDOSCOPY;  Service: Pulmonary;;   BRONCHIAL NEEDLE ASPIRATION BIOPSY  08/07/2023   Procedure: BRONCHIAL NEEDLE ASPIRATION BIOPSIES;  Surgeon: Shelah Lamar RAMAN, MD;  Location: [facility] ENDOSCOPY;  Service: Pulmonary;;   CARDIAC DEFIBRILLATOR PLACEMENT  2007   CATARACT EXTRACTION, BILATERAL     COLONOSCOPY  04/14/2013   colonic polyp, status post polypectomy. Mild panocolonic diverticulosis. Small internal hemorrhoids   DILATION AND CURETTAGE OF UTERUS  yrs ago   DIRECT LARYNGOSCOPY Right 10/04/2022   Procedure: DIRECT LARYNGOSCOPY;  Surgeon: Llewellyn Gerard LABOR, DO;  Location: [facility] OR;  Service: ENT;  Laterality: Right;   ESOPHAGOGASTRODUODENOSCOPY (EGD) WITH PROPOFOL  N/A 06/19/2022   Procedure: ESOPHAGOGASTRODUODENOSCOPY (EGD) WITH PROPOFOL ;  Surgeon: Elicia Claw, MD;  Location: [facility];  Service: Gastroenterology;  Laterality: N/A;   FIDUCIAL MARKER PLACEMENT  08/07/2023   Procedure: FIDUCIAL MARKER PLACEMENT;  Surgeon: Shelah Lamar RAMAN, MD;  Location[facility] ENDOSCOPY;  Service: Pulmonary;;   IMPACTION REMOVAL  06/19/2022   Procedure: IMPACTION REMOVAL;  Surgeon: Elicia Claw, MD;  Location: [facility] ENDOSCOPY;  Service: Gastroenterology;;   IMPLANTABLE CARDIOVERTER DEFIBRILLATOR GENERATOR CHANGE N/A 02/04/2013   Procedure: IMPLANTABLE CARDIOVERTER DEFIBRILLATOR GENERATOR CHANGE;  Surgeon: Danelle LELON Birmingham, MD;   Location: [facility] CATH LAB;  Service: Cardiovascular;  Laterality: N/A;   IR IMAGING GUIDED PORT INSERTION  10/27/2022   LYMPH NODE BIOPSY Right 10/04/2022   Procedure: EXCISIONAL OF RIGHT DEEP CERVICAL LYMPH NODE;  Surgeon: Llewellyn Gerard LABOR, DO;  Location: [facility] OR;  Service: ENT;  Laterality: Right;   SHOULDER ARTHROSCOPY Right 2015   TOTAL HIP ARTHROPLASTY Right 12/18/2017   Procedure: RIGHT TOTAL HIP ARTHROPLASTY;  Surgeon: Josefina Chew, MD;  Location: [facility] OR;  Service: Orthopedics;  Laterality: Right;   TOTAL HIP ARTHROPLASTY Left 04/19/2021   Procedure: TOTAL HIP ARTHROPLASTY;  Surgeon: Josefina Chew, MD;  Location: [facility] ORS;  Service: Orthopedics;  Laterality: Left;   TOTAL KNEE ARTHROPLASTY Right 05/28/2018   Procedure: TOTAL KNEE ARTHROPLASTY;  Surgeon: Josefina Chew, MD;  Location: [facility] ORS;  Service: Orthopedics;  Laterality: Right;  Adductor Block   TOTAL KNEE ARTHROPLASTY Left 08/12/2021   Procedure: TOTAL KNEE ARTHROPLASTY;  Surgeon: Josefina Chew, MD;  Location: [facility] ORS;  Service: Orthopedics;  Laterality: Left;   TUBAL LIGATION Bilateral yrs ago   VIDEO BRONCHOSCOPY WITH RADIAL ENDOBRONCHIAL ULTRASOUND  08/07/2023   Procedure: VIDEO BRONCHOSCOPY WITH RADIAL ENDOBRONCHIAL ULTRASOUND;  Surgeon: Shelah Lamar RAMAN, MD;  Location: [facility] ENDOSCOPY;  Service: Pulmonary;;   WISDOM TOOTH EXTRACTION     Social History   Socioeconomic History   Marital status: Single    Spouse name: Not on file   Number of children: 0   Years of education: 14   Highest education level: Not on file  Occupational History   Not on file  Tobacco Use   Smoking status: Former    Current packs/day: 0.00    Types: Cigarettes    Start date: 05/21/1966    Quit date: 05/21/1996    Years since quitting: 27.7   Smokeless tobacco: Never  Vaping Use   Vaping status: Never Used  Substance and Sexual Activity   Alcohol use: No   Drug use: Never   Sexual activity: Not on file  Other Topics Concern   Not on file  Social  History Narrative   Not on file   Social Drivers of Health   Financial Resource Strain: Low Risk  (05/28/2018)   Overall Financial Resource Strain (CARDIA)    Difficulty of Paying Living Expenses: Not hard at all  Food Insecurity: No Food Insecurity (08/28/2023)   Hunger Vital Sign    Worried About Running Out of Food in the Last Year: Never true    Ran Out of Food in the Last Year: Never true  Transportation Needs: No Transportation Needs (08/28/2023)   PRAPARE - Administrator, Civil Service (Medical): No    Lack of Transportation (Non-Medical): No  Physical Activity: Not on file  Stress: Not on file  Social Connections: Not on file   Family History  Problem Relation Age of Onset   Hypertension Mother    Thyroid  disease Mother    Alzheimer's disease Mother    Coronary artery  disease Father    Pulmonary embolism Father    Congestive Heart Failure Maternal Grandmother    Hypertension Maternal Grandmother    Heart attack Maternal Grandfather    Other Maternal Grandfather        carotid disease   Dementia Paternal Grandmother    Other Paternal Grandfather 49       accident   Allergies  Allergen Reactions   Sumatriptan Succinate Other (See Comments)    Chest pain, no triptans, pt states it makes my heart race   Amitriptyline Other (See Comments)    Weight gain   Prior to Admission medications   Medication Sig Start Date End Date Taking? Authorizing Provider  acetaminophen  (TYLENOL ) 650 MG CR tablet Take 1,300 mg by mouth every 8 (eight) hours as needed for pain.   Yes [provider]  b complex vitamins capsule Take 1 capsule by mouth daily.   Yes [provider]  Calcium  Carb-Cholecalciferol  (CALCIUM  600 + D PO) Take 1 tablet by mouth daily.   Yes [provider]  carvedilol  (COREG ) 12.5 MG tablet Take 12.5 mg by mouth 2 (two) times daily with a meal.    Yes [provider]  Cholecalciferol  (VITAMIN D3) 125 MCG (5000 UT)  capsule Take 5,000 Units by mouth daily.   Yes [provider]  cyanocobalamin  (VITAMIN B12) 1000 MCG tablet Take 1,000 mcg by mouth daily.   Yes [provider]  ELIQUIS  5 MG TABS tablet TAKE 1 TABLET BY MOUTH TWICE A DAY 10/19/23  Yes Waddell Danelle ORN, MD  FIBER PO Take 1 capsule by mouth 2 (two) times daily.   Yes [provider]  gabapentin  (NEURONTIN ) 300 MG capsule Take 300 mg by mouth 3 (three) times daily. 04/09/20  Yes [provider]  loratadine  (CLARITIN ) 10 MG tablet Take 10 mg by mouth daily.   Yes [provider]  Omega-3 Fatty Acids (FISH OIL) 1000 MG CAPS Take 1,000 mg by mouth daily.    Yes [provider]  prochlorperazine  (COMPAZINE ) 10 MG tablet Take 1 tablet (10 mg total) by mouth every 6 (six) hours as needed for nausea or vomiting. 12/17/23  Yes Onesimo Emaline Brink, MD  ramipril  (ALTACE ) 2.5 MG tablet Take 2.5 mg by mouth daily.     Yes [provider]  simvastatin  (ZOCOR ) 40 MG tablet Take 40 mg by mouth at bedtime.     Yes [provider]  tiZANidine  (ZANAFLEX ) 4 MG tablet Take 2 mg by mouth daily as needed for muscle spasms. 01/12/23  Yes [provider]  traZODone  (DESYREL ) 50 MG tablet Take 50 mg by mouth at bedtime.    Yes [provider]  venlafaxine  (EFFEXOR ) 75 MG tablet Take 75 mg by mouth daily. 03/06/21  Yes [provider]  vitamin E  400 UNIT capsule Take 400 Units by mouth daily.   Yes [provider]  zonisamide  (ZONEGRAN ) 50 MG capsule Take 50-100 mg by mouth See admin instructions. Take 50mg  by mouth in the morning and 100mg  at night. 09/13/17  Yes [provider]  lidocaine  (XYLOCAINE ) 2 % solution Use as directed 15 mLs in the mouth or throat as needed for mouth pain. Patient not taking: Reported on 02/20/2024 10/16/23   Onesimo Emaline Brink, MD  magic mouthwash w/lidocaine  SOLN Take 5 mLs by mouth 4 (four) times daily as needed for mouth pain. Suspension  contains equal amounts of Maalox Extra Strength, nystatin , diphenhydramine  and lidocaine . Patient not taking: Reported on  02/13/2024 10/16/23   Onesimo Emaline Brink, MD  nystatin  (MYCOSTATIN ) 100000 UNIT/ML suspension Take 5 mLs (500,000 Units total) by mouth 4 (four) times daily. Patient not taking: Reported on 02/13/2024 10/16/23   Onesimo Emaline Brink, MD  ondansetron  (ZOFRAN ) 8 MG tablet Take 1 tablet (8 mg total) by mouth every 8 (eight) hours as needed for nausea or vomiting. Patient not taking: Reported on 02/13/2024 12/17/23   Onesimo Emaline Brink, MD  sucralfate  (CARAFATE ) 1 g tablet TAKE 1 TABLET (1 G TOTAL) BY MOUTH 4 (FOUR) TIMES DAILY - WITH MEALS AND AT BEDTIME. DISSOLVE TAB IN 5-10 ML OF WATER  TO MAKE A THICK SLURRY AND THEN SWALLOW IT. Patient not taking: Reported on 02/13/2024 11/07/23   Onesimo Emaline Brink, MD     Positive ROS: All other systems have been reviewed and were otherwise negative with the exception of those mentioned in the HPI and as above.  Physical Exam: General: Alert, no acute distress Cardiovascular: No pedal edema Respiratory: No cyanosis, no use of accessory musculature GI: No organomegaly, abdomen is soft and non-tender Skin: No lesions in the area of chief complaint Neurologic: Sensation intact distally Psychiatric: Patient is competent for consent with normal mood and affect Lymphatic: No axillary or cervical lymphadenopathy  MUSCULOSKELETAL: On exam today right hand has some slight ulnar deviation of the metacarpal phalangeal joints.  Decreased sensation in her median nerve distribution.  Thenar eminence is firing, although she may have a little bit of atrophy with it.  Some degree of intrinsic atrophy as well.  She cannot make a full fist.    Her nerve conduction studies demonstrate severe carpal tunnel syndrome.    Assessment: Right hand severe carpal tunnel syndrome   Plan: Plan for Procedure(s): CARPAL TUNNEL RELEASE  The risks benefits and  alternatives were discussed with the patient including but not limited to the risks of nonoperative treatment, versus surgical intervention including infection, bleeding, nerve injury,  blood clots, cardiopulmonary complications, morbidity, mortality, among others, and they were willing to proceed.   Boysie Bonebrake K Ysidro Ramsay, PA-C    02/25/2024 11:45 AM

## 2024-02-26 ENCOUNTER — Ambulatory Visit (HOSPITAL_BASED_OUTPATIENT_CLINIC_OR_DEPARTMENT_OTHER): Payer: Self-pay | Admitting: Vascular Surgery

## 2024-02-26 ENCOUNTER — Other Ambulatory Visit: Payer: Self-pay

## 2024-02-26 ENCOUNTER — Ambulatory Visit (HOSPITAL_COMMUNITY): Payer: Self-pay | Admitting: Vascular Surgery

## 2024-02-26 ENCOUNTER — Encounter (HOSPITAL_COMMUNITY): Payer: Self-pay | Admitting: Orthopedic Surgery

## 2024-02-26 ENCOUNTER — Encounter (HOSPITAL_COMMUNITY)
Admission: RE | Disposition: A | Discharge status: Home / Self Care | Payer: Self-pay | Source: Home / Self Care | Attending: Orthopedic Surgery

## 2024-02-26 ENCOUNTER — Ambulatory Visit (HOSPITAL_COMMUNITY)
Admission: RE | Admit: 2024-02-26 | Discharge: 2024-02-26 | Disposition: A | Discharge status: Home / Self Care | Attending: Orthopedic Surgery | Admitting: Orthopedic Surgery

## 2024-02-26 DIAGNOSIS — G5601 Carpal tunnel syndrome, right upper limb: Secondary | ICD-10-CM

## 2024-02-26 DIAGNOSIS — I480 Paroxysmal atrial fibrillation: Secondary | ICD-10-CM | POA: Diagnosis not present

## 2024-02-26 DIAGNOSIS — I2510 Atherosclerotic heart disease of native coronary artery without angina pectoris: Secondary | ICD-10-CM | POA: Insufficient documentation

## 2024-02-26 DIAGNOSIS — Z8249 Family history of ischemic heart disease and other diseases of the circulatory system: Secondary | ICD-10-CM | POA: Diagnosis not present

## 2024-02-26 DIAGNOSIS — I5022 Chronic systolic (congestive) heart failure: Secondary | ICD-10-CM | POA: Diagnosis not present

## 2024-02-26 DIAGNOSIS — Z7901 Long term (current) use of anticoagulants: Secondary | ICD-10-CM | POA: Diagnosis not present

## 2024-02-26 DIAGNOSIS — I110 Hypertensive heart disease with heart failure: Secondary | ICD-10-CM

## 2024-02-26 DIAGNOSIS — Z87891 Personal history of nicotine dependence: Secondary | ICD-10-CM | POA: Diagnosis not present

## 2024-02-26 DIAGNOSIS — Z95810 Presence of automatic (implantable) cardiac defibrillator: Secondary | ICD-10-CM | POA: Insufficient documentation

## 2024-02-26 DIAGNOSIS — I509 Heart failure, unspecified: Secondary | ICD-10-CM | POA: Diagnosis not present

## 2024-02-26 DIAGNOSIS — I255 Ischemic cardiomyopathy: Secondary | ICD-10-CM | POA: Diagnosis not present

## 2024-02-26 DIAGNOSIS — Z9221 Personal history of antineoplastic chemotherapy: Secondary | ICD-10-CM | POA: Diagnosis not present

## 2024-02-26 DIAGNOSIS — Z86711 Personal history of pulmonary embolism: Secondary | ICD-10-CM | POA: Insufficient documentation

## 2024-02-26 DIAGNOSIS — Z86718 Personal history of other venous thrombosis and embolism: Secondary | ICD-10-CM | POA: Diagnosis not present

## 2024-02-26 DIAGNOSIS — E785 Hyperlipidemia, unspecified: Secondary | ICD-10-CM | POA: Diagnosis not present

## 2024-02-26 DIAGNOSIS — Z955 Presence of coronary angioplasty implant and graft: Secondary | ICD-10-CM | POA: Insufficient documentation

## 2024-02-26 DIAGNOSIS — Z8572 Personal history of non-Hodgkin lymphomas: Secondary | ICD-10-CM | POA: Insufficient documentation

## 2024-02-26 DIAGNOSIS — Z85118 Personal history of other malignant neoplasm of bronchus and lung: Secondary | ICD-10-CM | POA: Insufficient documentation

## 2024-02-26 DIAGNOSIS — I252 Old myocardial infarction: Secondary | ICD-10-CM | POA: Insufficient documentation

## 2024-02-26 DIAGNOSIS — Z923 Personal history of irradiation: Secondary | ICD-10-CM | POA: Insufficient documentation

## 2024-02-26 DIAGNOSIS — M1990 Unspecified osteoarthritis, unspecified site: Secondary | ICD-10-CM | POA: Diagnosis not present

## 2024-02-26 DIAGNOSIS — F418 Other specified anxiety disorders: Secondary | ICD-10-CM | POA: Diagnosis not present

## 2024-02-26 HISTORY — DX: Other complications of anesthesia, initial encounter: T88.59XA

## 2024-02-26 HISTORY — DX: Failed or difficult intubation, initial encounter: T88.4XXA

## 2024-02-26 HISTORY — PX: CARPAL TUNNEL RELEASE: SHX101

## 2024-02-26 MED ORDER — ACETAMINOPHEN 10 MG/ML IV SOLN: 1000.0000 mg | Freq: Once | INTRAVENOUS | Status: DC | PRN

## 2024-02-26 MED ORDER — BUPIVACAINE HCL (PF) 0.25 % IJ SOLN
INTRAMUSCULAR | Status: DC | PRN
  Administered 2024-02-26: 10 mL

## 2024-02-26 MED ORDER — DEXAMETHASONE SODIUM PHOSPHATE 10 MG/ML IJ SOLN
INTRAMUSCULAR | Status: AC
Start: 2024-02-26 — End: 2024-02-26
  Filled 2024-02-26: qty 2

## 2024-02-26 MED ORDER — LACTATED RINGERS IV SOLN: INTRAVENOUS | Status: DC

## 2024-02-26 MED ORDER — PHENYLEPHRINE 80 MCG/ML (10ML) SYRINGE FOR IV PUSH (FOR BLOOD PRESSURE SUPPORT)
PREFILLED_SYRINGE | INTRAVENOUS | Status: DC | PRN
  Administered 2024-02-26: 80 ug via INTRAVENOUS

## 2024-02-26 MED ORDER — OXYCODONE HCL 5 MG PO TABS: 5.0000 mg | ORAL_TABLET | Freq: Once | ORAL | Status: DC | PRN

## 2024-02-26 MED ORDER — ORAL CARE MOUTH RINSE: 15.0000 mL | Freq: Once | OROMUCOSAL | Status: AC

## 2024-02-26 MED ORDER — FENTANYL CITRATE (PF) 100 MCG/2ML IJ SOLN: 25.0000 ug | INTRAMUSCULAR | Status: DC | PRN

## 2024-02-26 MED ORDER — LIDOCAINE 2% (20 MG/ML) 5 ML SYRINGE
INTRAMUSCULAR | Status: AC
  Filled 2024-02-26: qty 20

## 2024-02-26 MED ORDER — POVIDONE-IODINE 10 % EX SWAB
2.0000 | Freq: Once | CUTANEOUS | Status: AC
  Administered 2024-02-26: 2 via TOPICAL

## 2024-02-26 MED ORDER — ONDANSETRON HCL 4 MG/2ML IJ SOLN: 4.0000 mg | Freq: Once | INTRAMUSCULAR | Status: DC | PRN

## 2024-02-26 MED ORDER — HYDROCODONE-ACETAMINOPHEN 5-325 MG PO TABS: 1.0000 | ORAL_TABLET | Freq: Four times a day (QID) | ORAL | 0 refills | Status: DC | PRN

## 2024-02-26 MED ORDER — FENTANYL CITRATE (PF) 250 MCG/5ML IJ SOLN
INTRAMUSCULAR | Status: AC
  Filled 2024-02-26: qty 5

## 2024-02-26 MED ORDER — DEXMEDETOMIDINE HCL IN NACL 400 MCG/100ML IV SOLN
INTRAVENOUS | Status: DC | PRN
Start: 2024-02-26 — End: 2024-02-26
  Administered 2024-02-26: 8 ug via INTRAVENOUS

## 2024-02-26 MED ORDER — POVIDONE-IODINE 7.5 % EX SOLN: Freq: Once | CUTANEOUS | Status: DC

## 2024-02-26 MED ORDER — OXYCODONE HCL 5 MG/5ML PO SOLN: 5.0000 mg | Freq: Once | ORAL | Status: DC | PRN

## 2024-02-26 MED ORDER — CEFAZOLIN SODIUM-DEXTROSE 2-4 GM/100ML-% IV SOLN
2.0000 g | INTRAVENOUS | Status: AC
  Administered 2024-02-26: 2 g via INTRAVENOUS
  Filled 2024-02-26: qty 100

## 2024-02-26 MED ORDER — ACETAMINOPHEN 500 MG PO TABS
1000.0000 mg | ORAL_TABLET | Freq: Once | ORAL | Status: DC
  Filled 2024-02-26: qty 2

## 2024-02-26 MED ORDER — CHLORHEXIDINE GLUCONATE 0.12 % MT SOLN
15.0000 mL | Freq: Once | OROMUCOSAL | Status: AC
  Administered 2024-02-26: 15 mL via OROMUCOSAL
  Filled 2024-02-26: qty 15

## 2024-02-26 MED ORDER — PROPOFOL 500 MG/50ML IV EMUL
INTRAVENOUS | Status: DC | PRN
Start: 2024-02-26 — End: 2024-02-26
  Administered 2024-02-26 (×2): 75 ug/kg/min via INTRAVENOUS

## 2024-02-26 MED ORDER — ONDANSETRON HCL 4 MG/2ML IJ SOLN
INTRAMUSCULAR | Status: AC
  Filled 2024-02-26: qty 4

## 2024-02-26 MED ORDER — PHENYLEPHRINE 80 MCG/ML (10ML) SYRINGE FOR IV PUSH (FOR BLOOD PRESSURE SUPPORT)
PREFILLED_SYRINGE | INTRAVENOUS | Status: AC
  Filled 2024-02-26: qty 30

## 2024-02-26 NOTE — Progress Notes (Signed)
 Dr. Keneth made aware that the patient has a Medtronic ICD. Ok to use a magnet during the procedure per Dr. Keneth.

## 2024-02-26 NOTE — Op Note (Signed)
 02/26/2024  10:12 AM  PATIENT:  Rachel Bates    PRE-OPERATIVE DIAGNOSIS:  Right carpal tunnel syndrome  POST-OPERATIVE DIAGNOSIS:  Same  PROCEDURE: Right CARPAL TUNNEL RELEASE  SURGEON:  Fonda SHAUNNA Olmsted, MD  PHYSICIAN ASSISTANT: Army Daring, PA-C, present and scrubbed throughout the case, critical for completion in a timely fashion, and for retraction, instrumentation, and closure.  ANESTHESIA:   Local with monitored anesthesia care  PREOPERATIVE INDICATIONS:  Rachel Bates is a  78 y.o. female with a diagnosis of Right carpal tunnel syndrome who failed conservative measures and elected for surgical management.    The risks benefits and alternatives were discussed with the patient preoperatively including but not limited to the risks of infection, bleeding, nerve injury, cardiopulmonary complications, the need for revision surgery, among others, and the patient was willing to proceed.  ESTIMATED BLOOD LOSS: Minimal  OPERATIVE IMPLANTS:   * No implants in log *  OPERATIVE FINDINGS: Thickening of the median nerve and transverse carpal ligament  OPERATIVE PROCEDURE: The patient was brought to the operating room placed in the supine position.  The right upper extremity was prepped and draped in usual sterile fashion.  Timeout performed.  10 mL of local anesthetic was injected into the intended incision line of the carpal tunnel release.  Once the anesthetic had set up, the arm was elevated and exsanguinated and tourniquet was inflated.  Incision was made over the carpal tunnel, blunt dissection carried down to the superficial fascia which was incised sharply.  Once I had access to the carpal tunnel I found the path using a Freer, and then sharply dissected through the transverse carpal ligament releasing this in entirety.  I used a Engineer, drilling to help protect the nerve, and I did not encounter any branches of the thenar branch, the nerve itself appeared fairly thick and wide,  and was entrapped by the transverse carpal ligament.  I had complete release all the way up to the transverse distal forearm fascia, which was slightly released.  The wounds were irrigated copiously, the tourniquet released, excellent hemostasis achieved, the wounds closed with nylon and a volar splint applied.  She tolerated the procedure well and there were no complications.

## 2024-02-26 NOTE — Transfer of Care (Signed)
 Immediate Anesthesia Transfer of Care Note  Patient: Rachel Bates  Procedure(s) Performed: CARPAL TUNNEL RELEASE (Right)  Patient Location: PACU  Anesthesia Type:MAC  Level of Consciousness: awake, alert , and oriented  Airway & Oxygen Therapy: Patient Spontanous Breathing  Post-op Assessment: Report given to RN and Post -op Vital signs reviewed and stable  Post vital signs: Reviewed and stable  Last Vitals:  Vitals Value Taken Time  BP 102/61 02/26/24 10:30  Temp 36.5 C 02/26/24 10:29  Pulse 71 02/26/24 10:34  Resp 17 02/26/24 10:34  SpO2 94 % 02/26/24 10:34  Vitals shown include unfiled device data.  Last Pain:  Vitals:   02/26/24 1030  TempSrc:   PainSc: 0-No pain      Patients Stated Pain Goal: 0 (02/26/24 0719)  Complications: No notable events documented.

## 2024-02-26 NOTE — Interval H&P Note (Signed)
 History and Physical Interval Note:  02/26/2024 8:22 AM  Rachel Bates  has presented today for surgery, with the diagnosis of Right carpal tunnel syndrome.  The various methods of treatment have been discussed with the patient and family. After consideration of risks, benefits and other options for treatment, the patient has consented to  Procedure(s): CARPAL TUNNEL RELEASE (Right) as a surgical intervention.  The patient's history has been reviewed, patient examined, no change in status, stable for surgery.  I have reviewed the patient's chart and labs.  Questions were answered to the patient's satisfaction.     Fonda SHAUNNA Olmsted

## 2024-02-26 NOTE — Discharge Instructions (Signed)
 Diet: As you were doing prior to hospitalization   Shower:  May shower but keep the wounds dry, use an occlusive plastic wrap, NO SOAKING IN TUB.  If the bandage gets wet, change with a clean dry gauze.  If you have a splint on, leave the splint in place and keep the splint dry with a plastic bag.  Dressing:  You may change your dressing 3-5 days after surgery, unless you have a splint.  If you have a splint, then just leave the splint in place and we will change your bandages during your first follow-up appointment.    If you had hand or foot surgery, we will plan to remove your stitches in about 2 weeks in the office.  For all other surgeries, there are sticky tapes (steri-strips) on your wounds and all the stitches are absorbable.  Leave the steri-strips in place when changing your dressings, they will peel off with time, usually 2-3 weeks.  Activity:  Increase activity slowly as tolerated, but follow the weight bearing instructions below.  The rules on driving is that you can not be taking narcotics while you drive, and you must feel in control of the vehicle.    Weight Bearing:   No bearing weight with right hand until follow up visit.    To prevent constipation: you may use a stool softener such as -  Colace (over the counter) 100 mg by mouth twice a day  Drink plenty of fluids (prune juice may be helpful) and high fiber foods Miralax  (over the counter) for constipation as needed.    Itching:  If you experience itching with your medications, try taking only a single pain pill, or even half a pain pill at a time.  You may take up to 10 pain pills per day, and you can also use benadryl  over the counter for itching or also to help with sleep.   Precautions:  If you experience chest pain or shortness of breath - call 911 immediately for transfer to the hospital emergency department!!  If you develop a fever greater that 101 F, purulent drainage from wound, increased redness or drainage from  wound, or calf pain -- Call the office at [phone]                                                Follow- Up Appointment:  Please call for an appointment to be seen in 2 weeks [place] - [phone]

## 2024-02-26 NOTE — Anesthesia Procedure Notes (Signed)
 Procedure Name: MAC Date/Time: 02/26/2024 9:34 AM  Performed by: Evette Ade, CRNAPre-anesthesia Checklist: Patient identified, Emergency Drugs available, Suction available, Patient being monitored and Timeout performed Patient Re-evaluated:Patient Re-evaluated prior to induction Oxygen Delivery Method: Nasal cannula Preoxygenation: Pre-oxygenation with 100% oxygen Induction Type: IV induction

## 2024-02-26 NOTE — Anesthesia Postprocedure Evaluation (Signed)
 Anesthesia Post Note  Patient: Rachel Bates  Procedure(s) Performed: CARPAL TUNNEL RELEASE (Right)     Patient location during evaluation: PACU Anesthesia Type: MAC Level of consciousness: awake and alert Pain management: pain level controlled Vital Signs Assessment: post-procedure vital signs reviewed and stable Respiratory status: spontaneous breathing, nonlabored ventilation, respiratory function stable and patient connected to nasal cannula oxygen Cardiovascular status: stable and blood pressure returned to baseline Postop Assessment: no apparent nausea or vomiting Anesthetic complications: no   No notable events documented.  Last Vitals:  Vitals:   02/26/24 1030 02/26/24 1045  BP: 102/61 125/62  Pulse: 69 71  Resp: 19 20  Temp:  (!) 36.3 C  SpO2: 92% 94%    Last Pain:  Vitals:   02/26/24 1045  TempSrc:   PainSc: 0-No pain                 Lynwood MARLA Cornea

## 2024-02-27 ENCOUNTER — Inpatient Hospital Stay

## 2024-02-27 ENCOUNTER — Inpatient Hospital Stay (HOSPITAL_BASED_OUTPATIENT_CLINIC_OR_DEPARTMENT_OTHER): Admitting: Hematology

## 2024-02-27 ENCOUNTER — Encounter (HOSPITAL_COMMUNITY): Payer: Self-pay | Admitting: Orthopedic Surgery

## 2024-02-27 VITALS — BP 115/63 | HR 76 | Temp 97.7°F | Resp 18 | Wt 152.9 lb

## 2024-02-27 DIAGNOSIS — C3411 Malignant neoplasm of upper lobe, right bronchus or lung: Secondary | ICD-10-CM | POA: Diagnosis not present

## 2024-02-27 DIAGNOSIS — C3491 Malignant neoplasm of unspecified part of right bronchus or lung: Secondary | ICD-10-CM

## 2024-02-27 DIAGNOSIS — Z87891 Personal history of nicotine dependence: Secondary | ICD-10-CM | POA: Diagnosis not present

## 2024-02-27 DIAGNOSIS — C8338 Diffuse large B-cell lymphoma, lymph nodes of multiple sites: Secondary | ICD-10-CM | POA: Diagnosis not present

## 2024-02-27 DIAGNOSIS — Z79899 Other long term (current) drug therapy: Secondary | ICD-10-CM | POA: Diagnosis not present

## 2024-02-27 DIAGNOSIS — Z7189 Other specified counseling: Secondary | ICD-10-CM

## 2024-02-27 DIAGNOSIS — Z5112 Encounter for antineoplastic immunotherapy: Secondary | ICD-10-CM

## 2024-02-27 DIAGNOSIS — Z95828 Presence of other vascular implants and grafts: Secondary | ICD-10-CM

## 2024-02-27 LAB — CMP (CANCER CENTER ONLY)
ALT: 11 U/L (ref 0–44)
AST: 18 U/L (ref 15–41)
Albumin: 4.1 g/dL (ref 3.5–5.0)
Alkaline Phosphatase: 74 U/L (ref 38–126)
Anion gap: 5 (ref 5–15)
BUN: 12 mg/dL (ref 8–23)
CO2: 28 mmol/L (ref 22–32)
Calcium: 9.1 mg/dL (ref 8.9–10.3)
Chloride: 107 mmol/L (ref 98–111)
Creatinine: 0.59 mg/dL (ref 0.44–1.00)
GFR, Estimated: >60 mL/min (ref >60)
Glucose, Bld: 93 mg/dL (ref 70–99)
Potassium: 4.1 mmol/L (ref 3.5–5.1)
Sodium: 140 mmol/L (ref 135–145)
Total Bilirubin: 0.5 mg/dL (ref 0.0–1.2)
Total Protein: 6.3 g/dL — ABNORMAL LOW (ref 6.5–8.1)

## 2024-02-27 LAB — CBC WITH DIFFERENTIAL (CANCER CENTER ONLY)
Abs Immature Granulocytes: 0 K/uL (ref 0.00–0.07)
Basophils Absolute: 0 K/uL (ref 0.0–0.1)
Basophils Relative: 0 %
Eosinophils Absolute: 0 K/uL (ref 0.0–0.5)
Eosinophils Relative: 2 %
HCT: 38.5 % (ref 36.0–46.0)
Hemoglobin: 13.2 g/dL (ref 12.0–15.0)
Immature Granulocytes: 0 %
Lymphocytes Relative: 19 %
Lymphs Abs: 0.5 K/uL — ABNORMAL LOW (ref 0.7–4.0)
MCH: 31 pg (ref 26.0–34.0)
MCHC: 34.3 g/dL (ref 30.0–36.0)
MCV: 90.4 fL (ref 80.0–100.0)
Monocytes Absolute: 0.4 K/uL (ref 0.1–1.0)
Monocytes Relative: 15 %
Neutro Abs: 1.6 K/uL — ABNORMAL LOW (ref 1.7–7.7)
Neutrophils Relative %: 64 %
Platelet Count: 86 K/uL — ABNORMAL LOW (ref 150–400)
RBC: 4.26 MIL/uL (ref 3.87–5.11)
RDW: 14.7 % (ref 11.5–15.5)
WBC Count: 2.5 K/uL — ABNORMAL LOW (ref 4.0–10.5)
nRBC: 0 % (ref 0.0–0.2)

## 2024-02-27 MED ORDER — SODIUM CHLORIDE 0.9% FLUSH: 10.0000 mL | INTRAVENOUS | Status: DC | PRN

## 2024-02-27 MED ORDER — SODIUM CHLORIDE 0.9 % IV SOLN
10.0000 mg/kg | Freq: Once | INTRAVENOUS | Status: AC
  Administered 2024-02-27: 740 mg via INTRAVENOUS
  Filled 2024-02-27: qty 4.8

## 2024-02-27 MED ORDER — SODIUM CHLORIDE 0.9% FLUSH
10.0000 mL | Freq: Once | INTRAVENOUS | Status: AC
  Administered 2024-02-27: 10 mL

## 2024-02-27 MED ORDER — SODIUM CHLORIDE 0.9 % IV SOLN: INTRAVENOUS | Status: DC

## 2024-02-27 NOTE — Patient Instructions (Signed)

## 2024-02-28 ENCOUNTER — Other Ambulatory Visit: Payer: Self-pay

## 2024-02-28 DIAGNOSIS — C3491 Malignant neoplasm of unspecified part of right bronchus or lung: Secondary | ICD-10-CM

## 2024-02-29 NOTE — Telephone Encounter (Signed)
 I have looked at RB scheduled. Pt scheduled herself at 1:30 online for 15 min slot. This is a new patient appt so she would need a 30 min time slot. I have moved RB schedule around since he had an opening on 8/27. Pt can stay scheduled for 1:30 and it will now be a 30 min time slot since other patient agreed to change appt time. Nfn

## 2024-03-04 ENCOUNTER — Ambulatory Visit: Payer: Medicare Other

## 2024-03-04 NOTE — Progress Notes (Signed)
 HEMATOLOGY/ONCOLOGY CLINIC NOTE  Date of Service:.02/27/2024  Patient Care Team: O'Buch, Glenis RIGGERS as PCP - General (Internal Medicine) Waddell Danelle ORN, MD as PCP - Cardiology (Cardiology) Harrietta Avelina LABOR, MD (Family Medicine) Onesimo Emaline Brink, MD as Consulting Physician (Hematology)  CHIEF COMPLAINTS/PURPOSE OF CONSULTATION:  Follow-up for continued management of non-small cell lung cancer and history of DLBCL  HISTORY OF PRESENTING ILLNESS:  See previous notes for details on initial presentation  INTERVAL HISTORY:  Rachel Bates is a 78 y.o. female is here for continued evaluation management of her squamous cell carcinoma of the lung and her next cycle of durvalumab  immunotherapy. No new chest pain or shortness of breath.  No new cough.  No hemoptysis. She had a follow-up CT chest with contrast on 02/13/2024 which shows continued decrease in the size of her treated mass in the anterior right perihilar upper lobe along with increased ground glass opacity throughout the perihilar right lung consistent with radiation pneumonitis.  There is an increase in size of nodules throughout the right lung apex.  Interval enlargement of the right hide pretracheal paratracheal and supraclavicular lymph nodes concerning for worsening nodule metastatic disease. The previously treated low pretracheal subcarinal and right hilar lymph nodes appear stable.  No fevers no chills no night sweats No acute new shortness of breath or chest pain or new cough Good p.o. intake with stable weight No other acute new focal symptoms.  MEDICAL HISTORY:  Past Medical History:  Diagnosis Date   AICD (automatic cardioverter/defibrillator) present dual   [employer] ---  original placedment 2007/  generator change 2014 by dr danelle taylor   Anemia    Anticoagulant long-term use    eliquis    Anxiety    Arthralgia of multiple joints    Arthritis pain    Benign hypertensive heart disease    CAD  (coronary artery disease) primary cardiologist-- dr danelle taylor   MI and 2 stents 1995 in [place]    Cancer [facility])    CHF NYHA class II, chronic, systolic (HCC)    followed by dr danelle taylor   Complication of anesthesia    Degenerative disc disease, lumbar    Depression    Difficult intubation    Dyslipidemia    History of basal cell carcinoma (BCC) excision    right ankle area s/p  excision in office 06/ 2019  in office   History of DVT of lower extremity yrs ago before 2012   History of MI (myocardial infarction) 1995  in [place]   History of pulmonary embolus (PE) 2012   History of ventricular tachycardia    Hyperlipidemia    Hypersomnia    Hypertension    Ischemic cardiomyopathy    Migraines    Myocardial infarction (HCC)    OA (osteoarthritis)    all over   PAF (paroxysmal atrial fibrillation) (HCC)    Pneumonia    02/2023   Primary localized osteoarthritis of right hip 12/18/2017   Primary localized osteoarthritis of right knee 05/28/2018   S/P coronary artery stent placement 1995   in [facility]   05-21-2018 per pt x2  stents in same coronary artery (unsure BM or DES)   Vitamin D  deficiency disease     SURGICAL HISTORY: Past Surgical History:  Procedure Laterality Date   BIOPSY  06/19/2022   Procedure: BIOPSY;  Surgeon: Elicia Claw, MD;  Location: [facility] ENDOSCOPY;  Service: Gastroenterology;;   BRONCHIAL BIOPSY  08/07/2023   Procedure: BRONCHIAL BIOPSIES;  Surgeon: Shelah,  Lamar RAMAN, MD;  Location: [facility] ENDOSCOPY;  Service: Pulmonary;;   BRONCHIAL BRUSHINGS  08/07/2023   Procedure: BRONCHIAL BRUSHINGS;  Surgeon: Shelah Lamar RAMAN, MD;  Location: [facility] ENDOSCOPY;  Service: Pulmonary;;   BRONCHIAL NEEDLE ASPIRATION BIOPSY  08/07/2023   Procedure: BRONCHIAL NEEDLE ASPIRATION BIOPSIES;  Surgeon: Shelah Lamar RAMAN, MD;  Location: [facility] ENDOSCOPY;  Service: Pulmonary;;   CARDIAC DEFIBRILLATOR PLACEMENT  2007   CARPAL TUNNEL RELEASE Right 02/26/2024   Procedure: CARPAL TUNNEL RELEASE;   Surgeon: Josefina Chew, MD;  Location: [facility] OR;  Service: Orthopedics;  Laterality: Right;   CATARACT EXTRACTION, BILATERAL     COLONOSCOPY  04/14/2013   colonic polyp, status post polypectomy. Mild panocolonic diverticulosis. Small internal hemorrhoids   DILATION AND CURETTAGE OF UTERUS  yrs ago   DIRECT LARYNGOSCOPY Right 10/04/2022   Procedure: DIRECT LARYNGOSCOPY;  Surgeon: Llewellyn Gerard LABOR, DO;  Location: [facility] OR;  Service: ENT;  Laterality: Right;   ESOPHAGOGASTRODUODENOSCOPY (EGD) WITH PROPOFOL  N/A 06/19/2022   Procedure: ESOPHAGOGASTRODUODENOSCOPY (EGD) WITH PROPOFOL ;  Surgeon: Elicia Claw, MD;  Location: [facility] ENDOS[facility];  Service: Gastroenterology;  Laterality: N/A;   FIDUCIAL MARKER PLACEMENT  08/07/2023   Procedure: FIDUCIAL MARKER PLACEMENT;  Surgeon: Shelah Lamar RAMAN, MD;  Location: [facility] ENDOSCOPY;  Service: Pulmonary;;   IMPACTION REMOVAL  06/19/2022   Procedure: IMPACTION REMOVAL;  Surgeon: Elicia Claw, MD;  Location: [facility] ENDOS[facility];  Service: Gastroenterology;;   IMPLANTABLE CARDIOVERTER DEFIBRILLATOR GENERATOR CHANGE N/A 02/04/2013   Procedure: IMPLANTABLE CARDIOVERTER DEFIBRILLATOR GENERATOR CHANGE;  Surgeon: Danelle LELON Birmingham, MD;  Location: [facility] CATH LAB;  Service: Cardiovascular;  Laterality: N/A;   IR IMAGING GUIDED PORT INSERTION  10/27/2022   LYMPH NODE BIOPSY Right 10/04/2022   Procedure: EXCISIONAL OF RIGHT DEEP CERVICAL LYMPH NODE;  Surgeon: Llewellyn Gerard LABOR, DO;  Location: [facility] OR;  Service: ENT;  Laterality: Right;   SHOULDER ARTHROSCOPY Right 2015   TOTAL HIP ARTHROPLASTY Right 12/18/2017   Procedure: RIGHT TOTAL HIP ARTHROPLASTY;  Surgeon: Josefina Chew, MD;  [facility]: [facility] OR;  Service: Orthopedics;  Laterality: Right;   TOTAL HIP ARTHROPLASTY Left 04/19/2021   Procedure: TOTAL HIP ARTHROPLASTY;  Surgeon: Josefina Chew, MD;  Location: [facility] ORS;  Service: Orthopedics;  Laterality: Left;   TOTAL KNEE ARTHROPLASTY Right 05/28/2018   Procedure: TOTAL KNEE ARTHROPLASTY;   Surgeon: Josefina Chew, MD;  Location: [facility] ORS;  Service: Orthopedics;  Laterality: Right;  Adductor Block   TOTAL KNEE ARTHROPLASTY Left 08/12/2021   Procedure: TOTAL KNEE ARTHROPLASTY;  Surgeon: Josefina Chew, MD;  Location: [facility] ORS;  Service: Orthopedics;  Laterality: Left;   TUBAL LIGATION Bilateral yrs ago   VIDEO BRONCHOSCOPY WITH RADIAL ENDOBRONCHIAL ULTRASOUND  08/07/2023   Procedure: VIDEO BRONCHOSCOPY WITH RADIAL ENDOBRONCHIAL ULTRASOUND;  Surgeon: Shelah Lamar RAMAN, MD;  Location: [facility] ENDOS[facility];  Service: Pulmonary;;   WISDOM TOOTH EXTRACTION      SOCIAL HISTORY: Social History   Socioeconomic History   Marital status: Single    Spouse name: Not on file   Number of children: 0   Years of education: 14   Highest education level: Not on file  Occupational History   Not on file  Tobacco Use   Smoking status: Former    Current packs/day: 0.00    Types: Cigarettes    Start date: 05/21/1966    Quit date: 05/21/1996    Years since quitting: 27.8   Smokeless tobacco: Never  Vaping Use   Vaping status: Never Used  Substance and Sexual Activity   Alcohol use: No  Drug use: Never   Sexual activity: Not on file  Other Topics Concern   Not on file  Social History Narrative   Not on file   Social Drivers of Health   Financial Resource Strain: Low Risk  (05/28/2018)   Overall Financial Resource Strain (CARDIA)    Difficulty of Paying Living Expenses: Not hard at all  Food Insecurity: No Food Insecurity (08/28/2023)   Hunger Vital Sign    Worried About Running Out of Food in the Last Year: Never true    Ran Out of Food in the Last Year: Never true  Transportation Needs: No Transportation Needs (08/28/2023)   PRAPARE - Administrator, Civil Service (Medical): No    Lack of Transportation (Non-Medical): No  Physical Activity: Not on file  Stress: Not on file  Social Connections: Not on file  Intimate Partner Violence: Not At Risk (08/28/2023)   Humiliation, Afraid,  Rape, and Kick questionnaire    Fear of Current or Ex-Partner: No    Emotionally Abused: No    Physically Abused: No    Sexually Abused: No    FAMILY HISTORY: Family History  Problem Relation Age of Onset   Hypertension Mother    Thyroid  disease Mother    Alzheimer's disease Mother    Coronary artery disease Father    Pulmonary embolism Father    Congestive Heart Failure Maternal Grandmother    Hypertension Maternal Grandmother    Heart attack Maternal Grandfather    Other Maternal Grandfather        carotid disease   Dementia Paternal Grandmother    Other Paternal Grandfather 57       accident    ALLERGIES:  is allergic to sumatriptan succinate.  MEDICATIONS:  Current Outpatient Medications  Medication Sig Dispense Refill   acetaminophen  (TYLENOL ) 650 MG CR tablet Take 1,300 mg by mouth every 8 (eight) hours as needed for pain.     b complex vitamins capsule Take 1 capsule by mouth daily.     Calcium  Carb-Cholecalciferol  (CALCIUM  600 + D PO) Take 1 tablet by mouth daily.     carvedilol  (COREG ) 12.5 MG tablet Take 12.5 mg by mouth 2 (two) times daily with a meal.      Cholecalciferol  (VITAMIN D3) 125 MCG (5000 UT) capsule Take 5,000 Units by mouth daily.     cyanocobalamin  (VITAMIN B12) 1000 MCG tablet Take 1,000 mcg by mouth daily.     ELIQUIS  5 MG TABS tablet TAKE 1 TABLET BY MOUTH TWICE A DAY 60 tablet 5   FIBER PO Take 1 capsule by mouth 2 (two) times daily.     gabapentin  (NEURONTIN ) 300 MG capsule Take 300 mg by mouth 3 (three) times daily.     HYDROcodone -acetaminophen  (NORCO/VICODIN) 5-325 MG tablet Take 1 tablet by mouth every 6 (six) hours as needed for severe pain (pain score 7-10). 10 tablet 0   loratadine  (CLARITIN ) 10 MG tablet Take 10 mg by mouth daily.     Omega-3 Fatty Acids (FISH OIL) 1000 MG CAPS Take 1,000 mg by mouth daily.      prochlorperazine  (COMPAZINE ) 10 MG tablet Take 1 tablet (10 mg total) by mouth every 6 (six) hours as needed for nausea or  vomiting. 30 tablet 1   ramipril  (ALTACE ) 2.5 MG tablet Take 2.5 mg by mouth daily.       simvastatin  (ZOCOR ) 40 MG tablet Take 40 mg by mouth at bedtime.       tiZANidine  (ZANAFLEX ) 4  MG tablet Take 2 mg by mouth daily as needed for muscle spasms.     traZODone  (DESYREL ) 50 MG tablet Take 50 mg by mouth at bedtime.      venlafaxine  (EFFEXOR ) 75 MG tablet Take 75 mg by mouth daily.     vitamin E  400 UNIT capsule Take 400 Units by mouth daily.     zonisamide  (ZONEGRAN ) 50 MG capsule Take 50-100 mg by mouth See admin instructions. Take 50mg  by mouth in the morning and 100mg  at night.     lidocaine  (XYLOCAINE ) 2 % solution Use as directed 15 mLs in the mouth or throat as needed for mouth pain. (Patient not taking: Reported on 02/27/2024) 100 mL 0   magic mouthwash w/lidocaine  SOLN Take 5 mLs by mouth 4 (four) times daily as needed for mouth pain. Suspension contains equal amounts of Maalox Extra Strength, nystatin , diphenhydramine  and lidocaine . (Patient not taking: Reported on 02/27/2024) 400 mL 0   nystatin  (MYCOSTATIN ) 100000 UNIT/ML suspension Take 5 mLs (500,000 Units total) by mouth 4 (four) times daily. (Patient not taking: Reported on 02/27/2024) 100 mL 0   ondansetron  (ZOFRAN ) 8 MG tablet Take 1 tablet (8 mg total) by mouth every 8 (eight) hours as needed for nausea or vomiting. (Patient not taking: Reported on 02/27/2024) 30 tablet 1   sucralfate  (CARAFATE ) 1 g tablet TAKE 1 TABLET (1 G TOTAL) BY MOUTH 4 (FOUR) TIMES DAILY - WITH MEALS AND AT BEDTIME. DISSOLVE TAB IN 5-10 ML OF WATER  TO MAKE A THICK SLURRY AND THEN SWALLOW IT. (Patient not taking: Reported on 02/27/2024) 270 tablet 1   No current facility-administered medications for this visit.    REVIEW OF SYSTEMS:   .10 Point review of Systems was done is negative except as noted above.  PHYSICAL EXAMINATION: .BP 115/63   Pulse 76   Temp 97.7 F (36.5 C)   Resp 18   Wt 152 lb 14.4 oz (69.4 kg)   SpO2 95%   BMI 23.95 kg/m   . GENERAL:alert, in no acute distress and comfortable SKIN: no acute rashes, no significant lesions EYES: conjunctiva are pink and non-injected, sclera anicteric OROPHARYNX: MMM, no exudates, no oropharyngeal erythema or ulceration NECK: supple, no JVD LYMPH:  no palpable lymphadenopathy in the cervical, axillary or inguinal regions LUNGS: clear to auscultation b/l with normal respiratory effort HEART: regular rate & rhythm ABDOMEN:  normoactive bowel sounds , non tender, not distended. Extremity: no pedal edema PSYCH: alert & oriented x 3 with fluent speech NEURO: no focal motor/sensory deficits    LABORATORY DATA:  I have reviewed the data as listed    Latest Ref Rng & Units 02/27/2024    9:05 AM 02/13/2024    8:30 AM 01/30/2024   10:19 AM  CBC  WBC 4.0 - 10.5 K/uL 2.5  2.7  3.8   Hemoglobin 12.0 - 15.0 g/dL 86.7  86.7  85.8   Hematocrit 36.0 - 46.0 % 38.5  37.9  42.8   Platelets 150 - 400 K/uL 86  76  102       Latest Ref Rng & Units 02/27/2024    9:05 AM 02/13/2024    8:30 AM 01/30/2024   10:18 AM  CMP  Glucose 70 - 99 mg/dL 93  98  86   BUN 8 - 23 mg/dL 12  13  17    Creatinine 0.44 - 1.00 mg/dL 9.40  9.39  9.40   Sodium 135 - 145 mmol/L 140  142  138  Potassium 3.5 - 5.1 mmol/L 4.1  3.6  4.2   Chloride 98 - 111 mmol/L 107  110  101   CO2 22 - 32 mmol/L 28  27  21    Calcium  8.9 - 10.3 mg/dL 9.1  8.8  9.5   Total Protein 6.5 - 8.1 g/dL 6.3  6.3    Total Bilirubin 0.0 - 1.2 mg/dL 0.5  0.4    Alkaline Phos 38 - 126 U/L 74  82    AST 15 - 41 U/L 18  17    ALT 0 - 44 U/L 11  12     Lab Results  Component Value Date   LDH 166 12/10/2023    -01/16/2024 [facility] 360:                 Molecular Pathology 10/21/2022:    Surgical Pathology 10/04/22: A. LYMPH NODE, RIGHT LEVEL 2 DEEP CERVICAL, EXCISION:  -Diffuse large B-cell lymphoma  -See comment COMMENT: The sections show diffuse effacement of the lymph nodal architecture primarily by a population of  large lymphoid cells characterized by vesicular chromatin and small nucleoli associated with apoptosis and brisk mitosis.  In some areas, the large atypical lymphoid proliferation extends into the perinodal adipose tissue.  In this background, there are scattered variably sized and somewhat disrupted aggregates of primarily small lymphoid cells characterized by high nuclear cytoplasmic ratio, angulated nuclear contours and small to inconspicuous nucleoli. Flow cytometric analysis was performed [facility] [phone]) and shows a monoclonal, lambda restricted B-cell population expressing CD10.  In addition, immunohistochemical stains for CD3, CD5, CD10, CD20, PAX5, BCL6, Bcl-2, Ki-67, CD30, CD138, CD21, EBV in addition to in situ hybridization for kappa and lambda were performed with appropriate controls.  The large lymphoid cells are positive for CD20, PAX5, CD10, BCL6, Bcl-2, and partially for cytoplasmic lambda.  No significant staining is seen with EBV, CD30, CD138 or cytoplasmic kappa.  Ki67 shows variably increased expression (more than 50% in some areas). CD21 highlights scattered somewhat disrupted follicular dendritic networks. The lymphoid aggregates of primarily small lymphoid cells previously described show positivity for B-cell markers CD20 and PAX5 in addition to CD10 and Bcl-2.  There is an admixed variable T-cell component in the background as seen with CD3 and CD5 and there is no apparent co-expression of CD5 in B-cell areas.  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    RADIOGRAPHIC STUDIES: I have personally reviewed the radiological images as listed and agreed with the findings in the report.   NM PET 09/04/2022: IMPRESSION: 1. There is a large tracer avid mass centered within the left base of tongue and left lingual region which extends  into the hypopharyngeal region ventral to the epiglottis. Imaging findings are compatible with a primary head and neck malignancy. 2. A smaller focus of increased uptake localizes to the right pharyngeal tonsil region. Indeterminate. 3. Bilateral tracer avid cervical and supraclavicular lymph nodes compatible with nodal metastasis. 4. There is a 7 mm tracer avid nodule within the anteromedial left upper lobe which is suspicious for pulmonary metastasis. 5. There is increased uptake identified along the long axis of the posterior right seventh rib. No corresponding lytic or sclerotic changes identified on the CT images. Cannot exclude bone metastases. 6. Multiple prominent retroperitoneal and mesenteric lymph nodes are identified which exhibit mild tracer uptake. This is a nonspecific finding and may reflect reactive  adenopathy. Metastatic adenopathy cannot be excluded. Attention on future surveillance imaging is advised. 7.  Aortic Atherosclerosis (ICD10-I70.0).   ASSESSMENT & PLAN:   78 y.o.  female with:   1. 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 2. 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  PLAN: - Discussed lab results from today 02/27/2024 CBC shows normal hemoglobin of 13.2, chronic thrombocytopenia with platelets of 86k WBC count of 2.5k with normal ANC of 1.6k and lymphopenia with lymphocyte counts of 0.5k CMP stable with no acute abnormalities. CT chest with contrast shows improvement in the disease which was not previous radiation chills but there is some growth of lymph nodes in the upper  mediastinum. We discussed and patient is agreeable to be referred to Dr. Shelah for consideration of bronchoscopy with biopsy of one of the upper mediastinal lymph nodes to evaluate for whether these are reactive due to radiation pneumonitis versus related to her squamous cell carcinoma of the lung versus her recently treated diffuse large B-cell lymphoma. The pathology will direct further treatment. The most likely possibility is that this is related to her squamous cell carcinoma of the lung and if this is confirmed, we will consult radiation oncology again for consideration of palliative radiation to these nodal stations and continue immunotherapy.  Radiation oncology does not agree with this approach the alternative would be to accentuate her immunotherapy by adding a CTL4 targeting immunotherapy in addition to PD1 inhibition. If this were recurrent large B-cell lymphoma would need to treat that accordingly  FOLLOW-UP: -Referral to Dr. Shelah from pulmonology for consideration of bronchoscopic biopsy of upper mediastinal lymph node for tissue diagnosis -Please schedule for continued durvalumab  every 2 weeks per integrated scheduling  The total time spent in the appointment was 32 minutes*.  All of the patient's questions were answered with apparent satisfaction. The patient knows to call the clinic with any problems, questions or concerns.   Emaline Saran MD MS AAHIVMS [facility] [place] [facility] Hematology/Oncology Physician [facility]  .*Total Encounter Time as defined by the Centers for Medicare and Medicaid Services includes, in addition to the face-to-face time of a patient visit (documented in the note above) non-face-to-face time: obtaining and reviewing outside history, ordering and reviewing medications, tests or procedures, care coordination (communications with other health care professionals or caregivers) and documentation in the medical record.

## 2024-03-04 NOTE — Progress Notes (Incomplete)
 HEMATOLOGY/ONCOLOGY CLINIC NOTE  Date of Service:.02/27/2024  Patient Care Team: O'Buch, Glenis RIGGERS as PCP - General (Internal Medicine) Waddell Danelle ORN, MD as PCP - Cardiology (Cardiology) Harrietta Avelina LABOR, MD (Family Medicine) Onesimo Emaline Brink, MD as Consulting Physician (Hematology)  CHIEF COMPLAINTS/PURPOSE OF CONSULTATION:  Management of recently diagnosed non small cel lung cancer Hx of DLBCL   HISTORY OF PRESENTING ILLNESS:  See previous notes for details on initial presentation  INTERVAL HISTORY:  Rachel Bates is a 78 y.o. female who presents today for follow up and management of squamous cell carcinoma of the lung.    Patient notes she is doing well and has no acute new concerns since her last clinic visit.  No new skin rashes no diarrhea.  No new chest pain or shortness of breath. She feels that she is tolerating her immunotherapy well without any acute issues. She is scheduled for her carpal tunnel release surgery on 02/26/2024 and ICD generator removal on 03/14/2024. We discussed that we will keep the durvalumab  every 2 weeks till she is done with all her surgical procedures and then may decide to go to monthly dosing. She will be having her CT of the chest later today  MEDICAL HISTORY:  Past Medical History:  Diagnosis Date  . AICD (automatic cardioverter/defibrillator) present dual   [employer] ---  original placedment 2007/  generator change 2014 by dr gregg taylor  . Anemia   . Anticoagulant long-term use    eliquis   . Anxiety   . Arthralgia of multiple joints   . Arthritis pain   . Benign hypertensive heart disease   . CAD (coronary artery disease) primary cardiologist-- dr gregg taylor   MI and 2 stents 1995 in [place]   . Cancer (HCC)   . CHF NYHA class II, chronic, systolic (HCC)    followed by dr danelle taylor  . Complication of anesthesia   . Degenerative disc disease, lumbar   . Depression   . Difficult intubation   . Dyslipidemia    . History of basal cell carcinoma (BCC) excision    right ankle area s/p  excision in office 06/ 2019  in office  . History of DVT of lower extremity yrs ago before 2012  . History of MI (myocardial infarction) 1995  in [place]  . History of pulmonary embolus (PE) 2012  . History of ventricular tachycardia   . Hyperlipidemia   . Hypersomnia   . Hypertension   . Ischemic cardiomyopathy   . Migraines   . Myocardial infarction (HCC)   . OA (osteoarthritis)    all over  . PAF (paroxysmal atrial fibrillation) (HCC)   . Pneumonia    02/2023  . Primary localized osteoarthritis of right hip 12/18/2017  . Primary localized osteoarthritis of right knee 05/28/2018  . S/P coronary artery stent placement 1995   in [place]   05-21-2018 per pt x2  stents in same coronary artery (unsure BM or DES)  . Vitamin D  deficiency disease     SURGICAL HISTORY: Past Surgical History:  Procedure Laterality Date  . BIOPSY  06/19/2022   Procedure: BIOPSY;  Surgeon: Elicia Claw, MD;  Location: [facility] ENDOSCOPY;  Service: Gastroenterology;;  . BRONCHIAL BIOPSY  08/07/2023   Procedure: BRONCHIAL BIOPSIES;  Surgeon: Shelah Lamar RAMAN, MD;  Location: [facility] ENDOSCOPY;  Service: Pulmonary;;  . BRONCHIAL BRUSHINGS  08/07/2023   Procedure: BRONCHIAL BRUSHINGS;  Surgeon: Shelah Lamar RAMAN, MD;  Location[facility] ENDOSCOPY;  Service: Pulmonary;;  .  BRONCHIAL NEEDLE ASPIRATION BIOPSY  08/07/2023   Procedure: BRONCHIAL NEEDLE ASPIRATION BIOPSIES;  Surgeon: Shelah Lamar RAMAN, MD;  Location: [facility] ENDOSCOPY;  Service: Pulmonary;;  . CARDIAC DEFIBRILLATOR PLACEMENT  2007  . CARPAL TUNNEL RELEASE Right 02/26/2024   Procedure: CARPAL TUNNEL RELEASE;  Surgeon: Josefina Chew, MD;  Location: [facility] OR;  Service: Orthopedics;  Laterality: Right;  . CATARACT EXTRACTION, BILATERAL    . COLONOSCOPY  04/14/2013   colonic polyp, status post polypectomy. Mild panocolonic diverticulosis. Small internal hemorrhoids  . DILATION AND CURETTAGE OF UTERUS  yrs ago   . DIRECT LARYNGOSCOPY Right 10/04/2022   Procedure: DIRECT LARYNGOSCOPY;  Surgeon: Llewellyn Gerard LABOR, DO;  Location: [facility] OR;  Service: ENT;  Laterality: Right;  . ESOPHAGOGASTRODUODENOSCOPY (EGD) WITH PROPOFOL  N/A 06/19/2022   Procedure: ESOPHAGOGASTRODUODENOSCOPY (EGD) WITH PROPOFOL ;  Surgeon: Elicia Claw, MD;  Location: [facility] ENDOSCOPY;  Service: Gastroenterology;  Laterality: N/A;  . FIDUCIAL MARKER PLACEMENT  08/07/2023   Procedure: FIDUCIAL MARKER PLACEMENT;  Surgeon: Shelah Lamar RAMAN, MD;  Location: [facility] ENDOSCOPY;  Service: Pulmonary;;  . IMPACTION REMOVAL  06/19/2022   Procedure: IMPACTION REMOVAL;  Surgeon: Elicia Claw, MD;  Location: [facility] ENDOSCOPY;  Service: Gastroenterology;;  . IMPLANTABLE CARDIOVERTER DEFIBRILLATOR GENERATOR CHANGE N/A 02/04/2013   Procedure: IMPLANTABLE CARDIOVERTER DEFIBRILLATOR GENERATOR CHANGE;  Surgeon: Danelle LELON Birmingham, MD;  Location: [facility] CATH LAB;  Service: Cardiovascular;  Laterality: N/A;  . IR IMAGING GUIDED PORT INSERTION  10/27/2022  . LYMPH NODE BIOPSY Right 10/04/2022   Procedure: EXCISIONAL OF RIGHT DEEP CERVICAL LYMPH NODE;  Surgeon: Llewellyn Gerard LABOR, DO;  Location: [facility] OR;  Service: ENT;  Laterality: Right;  . SHOULDER ARTHROSCOPY Right 2015  . TOTAL HIP ARTHROPLASTY Right 12/18/2017   Procedure: RIGHT TOTAL HIP ARTHROPLASTY;  Surgeon: Josefina Chew, MD;  Location: [facility] OR;  Service: Orthopedics;  Laterality: Right;  . TOTAL HIP ARTHROPLASTY Left 04/19/2021   Procedure: TOTAL HIP ARTHROPLASTY;  Surgeon: Josefina Chew, MD;  Location: [facility] ORS;  Service: Orthopedics;  Laterality: Left;  . TOTAL KNEE ARTHROPLASTY Right 05/28/2018   Procedure: TOTAL KNEE ARTHROPLASTY;  Surgeon: Josefina Chew, MD;  Location: [facility] ORS;  Service: Orthopedics;  Laterality: Right;  Adductor Block  . TOTAL KNEE ARTHROPLASTY Left 08/12/2021   Procedure: TOTAL KNEE ARTHROPLASTY;  Surgeon: Josefina Chew, MD;  Location: [facility] ORS;  Service: Orthopedics;  Laterality: Left;  . TUBAL  LIGATION Bilateral yrs ago  . VIDEO BRONCHOSCOPY WITH RADIAL ENDOBRONCHIAL ULTRASOUND  08/07/2023   Procedure: VIDEO BRONCHOSCOPY WITH RADIAL ENDOBRONCHIAL ULTRASOUND;  Surgeon: Shelah Lamar RAMAN, MD;  Location: [facility] ENDOSCOPY;  Service: Pulmonary;;  . WISDOM TOOTH EXTRACTION      SOCIAL HISTORY: Social History   Socioeconomic History  . Marital status: Single    Spouse name: Not on file  . Number of children: 0  . Years of education: 51  . Highest education level: Not on file  Occupational History  . Not on file  Tobacco Use  . Smoking status: Former    Current packs/day: 0.00    Types: Cigarettes    Start date: 05/21/1966    Quit date: 05/21/1996    Years since quitting: 27.8  . Smokeless tobacco: Never  Vaping Use  . Vaping status: Never Used  Substance and Sexual Activity  . Alcohol use: No  . Drug use: Never  . Sexual activity: Not on file  Other Topics Concern  . Not on file  Social History Narrative  . Not on file   Social Drivers of Health  Financial Resource Strain: Low Risk  (05/28/2018)   Overall Financial Resource Strain (CARDIA)   . Difficulty of Paying Living Expenses: Not hard at all  Food Insecurity: No Food Insecurity (08/28/2023)   Hunger Vital Sign   . Worried About Programme researcher, broadcasting/film/video in the Last Year: Never true   . Ran Out of Food in the Last Year: Never true  Transportation Needs: No Transportation Needs (08/28/2023)   PRAPARE - Transportation   . Lack of Transportation (Medical): No   . Lack of Transportation (Non-Medical): No  Physical Activity: Not on file  Stress: Not on file  Social Connections: Not on file  Intimate Partner Violence: Not At Risk (08/28/2023)   Humiliation, Afraid, Rape, and Kick questionnaire   . Fear of Current or Ex-Partner: No   . Emotionally Abused: No   . Physically Abused: No   . Sexually Abused: No    FAMILY HISTORY: Family History  Problem Relation Age of Onset  . Hypertension Mother   . Thyroid  disease  Mother   . Alzheimer's disease Mother   . Coronary artery disease Father   . Pulmonary embolism Father   . Congestive Heart Failure Maternal Grandmother   . Hypertension Maternal Grandmother   . Heart attack Maternal Grandfather   . Other Maternal Grandfather        carotid disease  . Dementia Paternal Grandmother   . Other Paternal Grandfather 8       accident    ALLERGIES:  is allergic to sumatriptan succinate.  MEDICATIONS:  Current Outpatient Medications  Medication Sig Dispense Refill  . acetaminophen  (TYLENOL ) 650 MG CR tablet Take 1,300 mg by mouth every 8 (eight) hours as needed for pain.    SABRA b complex vitamins capsule Take 1 capsule by mouth daily.    . Calcium  Carb-Cholecalciferol  (CALCIUM  600 + D PO) Take 1 tablet by mouth daily.    . carvedilol  (COREG ) 12.5 MG tablet Take 12.5 mg by mouth 2 (two) times daily with a meal.     . Cholecalciferol  (VITAMIN D3) 125 MCG (5000 UT) capsule Take 5,000 Units by mouth daily.    . cyanocobalamin  (VITAMIN B12) 1000 MCG tablet Take 1,000 mcg by mouth daily.    . ELIQUIS  5 MG TABS tablet TAKE 1 TABLET BY MOUTH TWICE A DAY 60 tablet 5  . FIBER PO Take 1 capsule by mouth 2 (two) times daily.    . gabapentin  (NEURONTIN ) 300 MG capsule Take 300 mg by mouth 3 (three) times daily.    . HYDROcodone -acetaminophen  (NORCO/VICODIN) 5-325 MG tablet Take 1 tablet by mouth every 6 (six) hours as needed for severe pain (pain score 7-10). 10 tablet 0  . loratadine  (CLARITIN ) 10 MG tablet Take 10 mg by mouth daily.    . Omega-3 Fatty Acids (FISH OIL) 1000 MG CAPS Take 1,000 mg by mouth daily.     . prochlorperazine  (COMPAZINE ) 10 MG tablet Take 1 tablet (10 mg total) by mouth every 6 (six) hours as needed for nausea or vomiting. 30 tablet 1  . ramipril  (ALTACE ) 2.5 MG tablet Take 2.5 mg by mouth daily.      . simvastatin  (ZOCOR ) 40 MG tablet Take 40 mg by mouth at bedtime.      . tiZANidine  (ZANAFLEX ) 4 MG tablet Take 2 mg by mouth daily as needed for  muscle spasms.    . traZODone  (DESYREL ) 50 MG tablet Take 50 mg by mouth at bedtime.     . venlafaxine  (EFFEXOR )  75 MG tablet Take 75 mg by mouth daily.    . vitamin E  400 UNIT capsule Take 400 Units by mouth daily.    . zonisamide  (ZONEGRAN ) 50 MG capsule Take 50-100 mg by mouth See admin instructions. Take 50mg  by mouth in the morning and 100mg  at night.    . lidocaine  (XYLOCAINE ) 2 % solution Use as directed 15 mLs in the mouth or throat as needed for mouth pain. (Patient not taking: Reported on 02/27/2024) 100 mL 0  . magic mouthwash w/lidocaine  SOLN Take 5 mLs by mouth 4 (four) times daily as needed for mouth pain. Suspension contains equal amounts of Maalox Extra Strength, nystatin , diphenhydramine  and lidocaine . (Patient not taking: Reported on 02/27/2024) 400 mL 0  . nystatin  (MYCOSTATIN ) 100000 UNIT/ML suspension Take 5 mLs (500,000 Units total) by mouth 4 (four) times daily. (Patient not taking: Reported on 02/27/2024) 100 mL 0  . ondansetron  (ZOFRAN ) 8 MG tablet Take 1 tablet (8 mg total) by mouth every 8 (eight) hours as needed for nausea or vomiting. (Patient not taking: Reported on 02/27/2024) 30 tablet 1  . sucralfate  (CARAFATE ) 1 g tablet TAKE 1 TABLET (1 G TOTAL) BY MOUTH 4 (FOUR) TIMES DAILY - WITH MEALS AND AT BEDTIME. DISSOLVE TAB IN 5-10 ML OF WATER  TO MAKE A THICK SLURRY AND THEN SWALLOW IT. (Patient not taking: Reported on 02/27/2024) 270 tablet 1   No current facility-administered medications for this visit.    REVIEW OF SYSTEMS:   .10 Point review of Systems was done is negative except as noted above.  PHYSICAL EXAMINATION: .BP 115/63   Pulse 76   Temp 97.7 F (36.5 C)   Resp 18   Wt 152 lb 14.4 oz (69.4 kg)   SpO2 95%   BMI 23.95 kg/m  . GENERAL:alert, in no acute distress and comfortable SKIN: no acute rashes, no significant lesions EYES: conjunctiva are pink and non-injected, sclera anicteric OROPHARYNX: MMM, no exudates, no oropharyngeal erythema or  ulceration NECK: supple, no JVD LYMPH:  no palpable lymphadenopathy in the cervical, axillary or inguinal regions LUNGS: clear to auscultation b/l with normal respiratory effort HEART: regular rate & rhythm ABDOMEN:  normoactive bowel sounds , non tender, not distended. Extremity: no pedal edema PSYCH: alert & oriented x 3 with fluent speech NEURO: no focal motor/sensory deficits   LABORATORY DATA:  I have reviewed the data as listed    Latest Ref Rng & Units 02/27/2024    9:05 AM 02/13/2024    8:30 AM 01/30/2024   10:19 AM  CBC  WBC 4.0 - 10.5 K/uL 2.5  2.7  3.8   Hemoglobin 12.0 - 15.0 g/dL 86.7  86.7  85.8   Hematocrit 36.0 - 46.0 % 38.5  37.9  42.8   Platelets 150 - 400 K/uL 86  76  102       Latest Ref Rng & Units 02/27/2024    9:05 AM 02/13/2024    8:30 AM 01/30/2024   10:18 AM  CMP  Glucose 70 - 99 mg/dL 93  98  86   BUN 8 - 23 mg/dL 12  13  17    Creatinine 0.44 - 1.00 mg/dL 9.40  9.39  9.40   Sodium 135 - 145 mmol/L 140  142  138   Potassium 3.5 - 5.1 mmol/L 4.1  3.6  4.2   Chloride 98 - 111 mmol/L 107  110  101   CO2 22 - 32 mmol/L 28  27  21    Calcium   8.9 - 10.3 mg/dL 9.1  8.8  9.5   Total Protein 6.5 - 8.1 g/dL 6.3  6.3    Total Bilirubin 0.0 - 1.2 mg/dL 0.5  0.4    Alkaline Phos 38 - 126 U/L 74  82    AST 15 - 41 U/L 18  17    ALT 0 - 44 U/L 11  12     Lab Results  Component Value Date   LDH 166 12/10/2023    -01/16/2024 [facility] 360:                 Molecular Pathology 10/21/2022:    Surgical Pathology 10/04/22: A. LYMPH NODE, RIGHT LEVEL 2 DEEP CERVICAL, EXCISION:  -Diffuse large B-cell lymphoma  -See comment COMMENT: The sections show diffuse effacement of the lymph nodal architecture primarily by a population of large lymphoid cells characterized by vesicular chromatin and small nucleoli associated with apoptosis and brisk mitosis.  In some areas, the large atypical lymphoid proliferation extends into the perinodal adipose tissue.  In  this background, there are scattered variably sized and somewhat disrupted aggregates of primarily small lymphoid cells characterized by high nuclear cytoplasmic ratio, angulated nuclear contours and small to inconspicuous nucleoli. Flow cytometric analysis was performed [facility] [id number]) and shows a monoclonal, lambda restricted B-cell population expressing CD10.  In addition, immunohistochemical stains for CD3, CD5, CD10, CD20, PAX5, BCL6, Bcl-2, Ki-67, CD30, CD138, CD21, EBV in addition to in situ hybridization for kappa and lambda were performed with appropriate controls.  The large lymphoid cells are positive for CD20, PAX5, CD10, BCL6, Bcl-2, and partially for cytoplasmic lambda.  No significant staining is seen with EBV, CD30, CD138 or cytoplasmic kappa.  Ki67 shows variably increased expression (more than 50% in some areas). CD21 highlights scattered somewhat disrupted follicular dendritic networks. The lymphoid aggregates of primarily small lymphoid cells previously described show positivity for B-cell markers CD20 and PAX5 in addition to CD10 and Bcl-2.  There is an admixed variable T-cell component in the background as seen with CD3 and CD5 and there is no apparent co-expression of CD5 in B-cell areas.  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    RADIOGRAPHIC STUDIES: I have personally reviewed the radiological images as listed and agreed with the findings in the report.   NM PET 09/04/2022: IMPRESSION: 1. There is a large tracer avid mass centered within the left base of tongue and left lingual region which extends into the hypopharyngeal region ventral to the epiglottis. Imaging findings are compatible with a primary head and neck malignancy. 2. A smaller focus of increased uptake localizes to the right pharyngeal tonsil region.  Indeterminate. 3. Bilateral tracer avid cervical and supraclavicular lymph nodes compatible with nodal metastasis. 4. There is a 7 mm tracer avid nodule within the anteromedial left upper lobe which is suspicious for pulmonary metastasis. 5. There is increased uptake identified along the long axis of the posterior right seventh rib. No corresponding lytic or sclerotic changes identified on the CT images. Cannot exclude bone metastases. 6. Multiple prominent retroperitoneal and mesenteric lymph nodes are identified which exhibit mild tracer uptake. This is a nonspecific finding and may reflect reactive adenopathy. Metastatic adenopathy cannot be excluded. Attention on future surveillance imaging is advised. 7.  Aortic Atherosclerosis (ICD10-I70.0).   ASSESSMENT & PLAN:   78 y.o.  female with:   1. 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 2. 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  PLAN:  -Discussed lab results from 02/13/2024 in details as noted above CBC and CMP stable Patient notes no new toxicities from current current durvalumab  immunotherapy.  No skin rashes no diarrhea no other acute new focal symptoms. Patient has no clinical symptoms suggestive of lung cancer progression at this time. She has a CT of the chest planned for later today which we shall follow-up. -She will be having her carpal tunnel release procedure later this month and her ICD pacemaker generator removal in the first week of September. - Further treatment based on CT chest results.  If there is any sign of progression depending on  the extent of progression might consider additional palliative radiation alongside immunotherapy versus switching to treatment for metastatic lung cancer with carboplatin  plus Taxol  plus immunotherapy.  FOLLOW-UP: Per integrated scheduling continue Durvalumab  every 2 weeks To follow-up on CT of the chest  The total time spent in the appointment was 30 minutes*.  All of the patient's questions were answered with apparent satisfaction. The patient knows to call the clinic with any problems, questions or concerns.   Emaline Saran MD MS AAHIVMS [facility] Hematology/Oncology Physician [facility]  .*Total Encounter Time as defined by the Centers for Medicare and Medicaid Services includes, in addition to the face-to-face time of a patient visit (documented in the note above) non-face-to-face time: obtaining and reviewing outside history, ordering and reviewing medications, tests or procedures, care coordination (communications with other health care professionals or caregivers) and documentation in the medical record.

## 2024-03-05 ENCOUNTER — Encounter: Payer: Self-pay | Admitting: Hematology

## 2024-03-05 ENCOUNTER — Encounter: Payer: Self-pay | Admitting: Emergency Medicine

## 2024-03-05 ENCOUNTER — Telehealth: Payer: Self-pay | Admitting: Emergency Medicine

## 2024-03-05 ENCOUNTER — Encounter: Payer: Self-pay | Admitting: Acute Care

## 2024-03-05 ENCOUNTER — Ambulatory Visit: Admitting: Emergency Medicine

## 2024-03-05 ENCOUNTER — Ambulatory Visit (INDEPENDENT_AMBULATORY_CARE_PROVIDER_SITE_OTHER): Admitting: Emergency Medicine

## 2024-03-05 VITALS — BP 93/59 | HR 83 | Ht 67.0 in | Wt 153.8 lb

## 2024-03-05 DIAGNOSIS — R911 Solitary pulmonary nodule: Secondary | ICD-10-CM

## 2024-03-05 DIAGNOSIS — R590 Localized enlarged lymph nodes: Secondary | ICD-10-CM | POA: Insufficient documentation

## 2024-03-05 NOTE — H&P (View-Only) (Signed)
 Subjective:    Patient ID: Devere MARLA Edu, female    DOB: 1945/11/01, 78 y.o.   MRN: [id number]  HPI Discussed the use of AI scribe software for clinical note transcription with the patient, who gave verbal consent to proceed.  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  She has a history of squamous cell lung cancer, initially diagnosed via bronchoscopy on August 07, 2023, which revealed a right upper lobe pulmonary nodule. A surveillance CT scan on February 13, 2024, showed a decrease in the anterior parahilar right upper lobe lesion, now measuring 1.8 by 0.9 cm. However, there was a significant interval increase in dense ground glass airspace opacity in the region, and an interval increase in the size of nodules throughout the right apex. Additionally, there was enlargement of high right pretracheal, paratracheal, and supraclavicular nodes.  A recent PET scan showed a small right apical nodule and an enlarged 7-8 mm left upper lobe nodule with hypermetabolism. She underwent combination treatment for her lung cancer, which included radiation.  She has a history of diffuse large B cell lymphoma, diagnosed on cervical lymph node biopsy. Her past medical history is significant for hypertension, coronary artery disease with ischemic cardiomyopathy, pulmonary embolism, aortic insufficiency, atrial fibrillation, and osteoarthritis. She is a former smoker.  She is currently taking Eliquis , which she stops for two days prior to procedures. She is scheduled to have her defibrillator removed on March 14, 2024, and is coordinating her medication schedule around this procedure.  No additional blood thinning medications such as aspirin  or Plavix are being taken.    Results RADIOLOGY Chest CT scan: Decrease in anterior parahilar right upper lobe lesion to  1.8 x 0.9 cm, interval increase in dense ground glass airspace opacity, interval increase in size of nodules throughout right apex, interval enlargement of high right pretracheal, paratracheal, and supraclavicular nodes concerning for nodal metastasis (02/13/2024) PET scan: Small right apical nodule, enlarged 7-8 mm left upper lobe nodule with hypermetabolism, enlarged lymph nodes in middle of chest on right side with activity  DIAGNOSTIC Bronchoscopy: Squamous cell lung carcinoma in right upper lobe pulmonary nodule (08/07/2023)  PATHOLOGY Cervical lymph node biopsy: Diffuse large B-cell lymphoma    Review of Systems As per HPI  Past Medical History:  Diagnosis Date   AICD (automatic cardioverter/defibrillator) present dual   Medtronic ---  original placedment 2007/  generator change 2014 by dr danelle taylor   Anemia    Anticoagulant long-term use    eliquis    Anxiety    Arthralgia of multiple joints    Arthritis pain    Benign hypertensive heart disease    CAD (coronary artery disease) primary cardiologist-- dr danelle taylor   MI and 2 stents 1995 in [place]    Cancer [facility])    Cataract ?   Surgery 2020   CHF NYHA class II, chronic, systolic (HCC)    followed by dr danelle taylor   Complication of anesthesia    Degenerative disc disease, lumbar    Depression    Difficult intubation    Dyslipidemia    History of basal cell carcinoma (BCC) excision    right ankle area s/p  excision in office 06/ 2019  in office   History of DVT of lower extremity yrs ago before 2012   History of MI (myocardial infarction) 1995  in  [place]   History of pulmonary embolus (PE) 2012   History of ventricular tachycardia    Hyperlipidemia    Hypersomnia    Hypertension    Ischemic cardiomyopathy    Migraines    Myocardial infarction (HCC)    OA (osteoarthritis)    all over   PAF (paroxysmal atrial fibrillation) (HCC)    Pneumonia    02/2023   Primary localized osteoarthritis of right hip  12/18/2017   Primary localized osteoarthritis of right knee 05/28/2018   S/P coronary artery stent placement 1995   in [facility]   05-21-2018 per pt x2  stents in same coronary artery (unsure BM or DES)   Vitamin D  deficiency disease     Family History  Problem Relation Age of Onset   Hypertension Mother    Thyroid  disease Mother    Alzheimer's disease Mother    Hearing loss Mother    Coronary artery disease Father    Pulmonary embolism Father    Arthritis Father    Heart disease Father    Congestive Heart Failure Maternal Grandmother    Hypertension Maternal Grandmother    Heart attack Maternal Grandfather    Other Maternal Grandfather        carotid disease   Dementia Paternal Grandmother    Other Paternal Grandfather 63       accident   Cancer Sister      Social History   Socioeconomic History   Marital status: Single    Spouse name: Not on file   Number of children: 0   Years of education: 14   Highest education level: Not on file  Occupational History   Not on file  Tobacco Use   Smoking status: Former    Current packs/day: 0.00    Types: Cigarettes    Start date: 05/21/1966    Quit date: 05/21/1996    Years since quitting: 27.8   Smokeless tobacco: Never  Vaping Use   Vaping status: Never Used  Substance and Sexual Activity   Alcohol use: No   Drug use: Never   Sexual activity: Not on file  Other Topics Concern   Not on file  Social History Narrative   Not on file   Social Drivers of Health   Financial Resource Strain: Low Risk  (05/28/2018)   Overall Financial Resource Strain (CARDIA)    Difficulty of Paying Living Expenses: Not hard at all  Food Insecurity: No Food Insecurity (08/28/2023)   Hunger Vital Sign    Worried About Running Out of Food in the Last Year: Never true    Ran Out of Food in the Last Year: Never true  Transportation Needs: No Transportation Needs (08/28/2023)   PRAPARE - Administrator, Civil Service (Medical): No     Lack of Transportation (Non-Medical): No  Physical Activity: Not on file  Stress: Not on file  Social Connections: Not on file  Intimate Partner Violence: Not At Risk (08/28/2023)   Humiliation, Afraid, Rape, and Kick questionnaire    Fear of Current or Ex-Partner: No    Emotionally Abused: No    Physically Abused: No    Sexually Abused: No    Allergies  Allergen Reactions   Sumatriptan Succinate Other (See Comments)    Chest pain, no triptans, pt states it makes my heart race    Current Outpatient Medications on File Prior to Visit  Medication Sig Dispense Refill   acetaminophen  (TYLENOL ) 650 MG CR tablet Take 1,300 mg by  mouth every 8 (eight) hours as needed for pain.     b complex vitamins capsule Take 1 capsule by mouth daily.     carvedilol  (COREG ) 12.5 MG tablet Take 12.5 mg by mouth 2 (two) times daily with a meal.      Cholecalciferol  (VITAMIN D3) 125 MCG (5000 UT) capsule Take 5,000 Units by mouth daily.     cyanocobalamin  (VITAMIN B12) 1000 MCG tablet Take 1,000 mcg by mouth daily.     ELIQUIS  5 MG TABS tablet TAKE 1 TABLET BY MOUTH TWICE A DAY 60 tablet 5   FIBER PO Take 1 capsule by mouth 2 (two) times daily.     gabapentin  (NEURONTIN ) 300 MG capsule Take 300 mg by mouth 3 (three) times daily.     HYDROcodone -acetaminophen  (NORCO/VICODIN) 5-325 MG tablet Take 1 tablet by mouth every 6 (six) hours as needed for severe pain (pain score 7-10). 10 tablet 0   loratadine  (CLARITIN ) 10 MG tablet Take 10 mg by mouth daily.     prochlorperazine  (COMPAZINE ) 10 MG tablet Take 1 tablet (10 mg total) by mouth every 6 (six) hours as needed for nausea or vomiting. 30 tablet 1   ramipril  (ALTACE ) 2.5 MG tablet Take 2.5 mg by mouth daily.       simvastatin  (ZOCOR ) 40 MG tablet Take 40 mg by mouth at bedtime.       sucralfate  (CARAFATE ) 1 g tablet TAKE 1 TABLET (1 G TOTAL) BY MOUTH 4 (FOUR) TIMES DAILY - WITH MEALS AND AT BEDTIME. DISSOLVE TAB IN 5-10 ML OF WATER  TO MAKE A THICK SLURRY  AND THEN SWALLOW IT. 270 tablet 1   tiZANidine  (ZANAFLEX ) 4 MG tablet Take 2 mg by mouth daily as needed for muscle spasms.     traZODone  (DESYREL ) 50 MG tablet Take 50 mg by mouth at bedtime.      venlafaxine  (EFFEXOR ) 75 MG tablet Take 75 mg by mouth daily.     vitamin E  400 UNIT capsule Take 400 Units by mouth daily.     zonisamide  (ZONEGRAN ) 50 MG capsule Take 50-100 mg by mouth See admin instructions. Take 50mg  by mouth in the morning and 100mg  at night.     Calcium  Carb-Cholecalciferol  (CALCIUM  600 + D PO) Take 1 tablet by mouth daily. (Patient not taking: Reported on 03/05/2024)     lidocaine  (XYLOCAINE ) 2 % solution Use as directed 15 mLs in the mouth or throat as needed for mouth pain. (Patient not taking: Reported on 03/05/2024) 100 mL 0   magic mouthwash w/lidocaine  SOLN Take 5 mLs by mouth 4 (four) times daily as needed for mouth pain. Suspension contains equal amounts of Maalox Extra Strength, nystatin , diphenhydramine  and lidocaine . (Patient not taking: Reported on 03/05/2024) 400 mL 0   nystatin  (MYCOSTATIN ) 100000 UNIT/ML suspension Take 5 mLs (500,000 Units total) by mouth 4 (four) times daily. (Patient not taking: Reported on 03/05/2024) 100 mL 0   Omega-3 Fatty Acids (FISH OIL) 1000 MG CAPS Take 1,000 mg by mouth daily.  (Patient not taking: Reported on 03/05/2024)     ondansetron  (ZOFRAN ) 8 MG tablet Take 1 tablet (8 mg total) by mouth every 8 (eight) hours as needed for nausea or vomiting. (Patient not taking: Reported on 03/05/2024) 30 tablet 1   No current facility-administered medications on file prior to visit.       Objective:    Vitals:   03/05/24 1323  BP: (!) 93/59  Pulse: 83  SpO2: 94%  Weight: 153 lb 12.8  oz (69.8 kg)  Height: 5' 7 (1.702 m)   Physical Exam Gen: Pleasant, well-nourished, in no distress,  normal affect  ENT: No lesions,  mouth clear,  oropharynx clear, no postnasal drip  Neck: No JVD, no stridor  Lungs: No use of accessory muscles, no  crackles or wheezing on normal respiration, no wheeze on forced expiration  Cardiovascular: RRR, heart sounds normal, no murmur or gallops, no peripheral edema  Musculoskeletal: No deformities, no cyanosis or clubbing  Neuro: alert, awake, non focal  Skin: Warm, no lesions or rashes       Assessment & Plan:   Assessment & Plan Lung nodule  Mediastinal adenopathy   Assessment and Plan Assessment & Plan Right upper lobe squamous cell lung cancer, status post radiation and chemotherapy, with new pulmonary nodules and mediastinal lymphadenopathy suspicious for recurrence or metastasis Recent imaging showed decreased anterior parahilar right upper lobe lesion but increased dense ground glass opacity, likely radiation pneumonitis. Increased nodules in right apex and enlarged right pretracheal, paratracheal, and supraclavicular nodes suggest nodal metastasis. Small right apical nodule and 7-8 mm left upper lobe nodule with hypermetabolism noted. Differential includes recurrence or metastasis versus post-radiation changes. - Perform bronchoscopy to biopsy accessible nodules and lymph nodes. - Schedule bronchoscopy for September 8th or 9th, contingent on post-defibrillator removal recovery.  Atrial fibrillation, on anticoagulation On Eliquis  for atrial fibrillation. Risk of being off anticoagulation is low; benefits of tissue diagnosis outweigh risks. - Stop Eliquis  for two days prior to bronchoscopy.     No follow-ups on file.   Lamar Chris, MD, PhD 03/05/2024, 2:01 PM [facility] [phone] or if no answer before 7:00PM call [phone] For any issues after 7:00PM please call eLink [phone]

## 2024-03-05 NOTE — Progress Notes (Signed)
 Subjective:    Patient ID: Rachel Bates, female    DOB: 1945/11/01, 78 y.o.   MRN: [id number]  HPI Discussed the use of AI scribe software for clinical note transcription with the patient, who gave verbal consent to proceed.  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  She has a history of squamous cell lung cancer, initially diagnosed via bronchoscopy on August 07, 2023, which revealed a right upper lobe pulmonary nodule. A surveillance CT scan on February 13, 2024, showed a decrease in the anterior parahilar right upper lobe lesion, now measuring 1.8 by 0.9 cm. However, there was a significant interval increase in dense ground glass airspace opacity in the region, and an interval increase in the size of nodules throughout the right apex. Additionally, there was enlargement of high right pretracheal, paratracheal, and supraclavicular nodes.  A recent PET scan showed a small right apical nodule and an enlarged 7-8 mm left upper lobe nodule with hypermetabolism. She underwent combination treatment for her lung cancer, which included radiation.  She has a history of diffuse large B cell lymphoma, diagnosed on cervical lymph node biopsy. Her past medical history is significant for hypertension, coronary artery disease with ischemic cardiomyopathy, pulmonary embolism, aortic insufficiency, atrial fibrillation, and osteoarthritis. She is a former smoker.  She is currently taking Eliquis , which she stops for two days prior to procedures. She is scheduled to have her defibrillator removed on March 14, 2024, and is coordinating her medication schedule around this procedure.  No additional blood thinning medications such as aspirin  or Plavix are being taken.    Results RADIOLOGY Chest CT scan: Decrease in anterior parahilar right upper lobe lesion to  1.8 x 0.9 cm, interval increase in dense ground glass airspace opacity, interval increase in size of nodules throughout right apex, interval enlargement of high right pretracheal, paratracheal, and supraclavicular nodes concerning for nodal metastasis (02/13/2024) PET scan: Small right apical nodule, enlarged 7-8 mm left upper lobe nodule with hypermetabolism, enlarged lymph nodes in middle of chest on right side with activity  DIAGNOSTIC Bronchoscopy: Squamous cell lung carcinoma in right upper lobe pulmonary nodule (08/07/2023)  PATHOLOGY Cervical lymph node biopsy: Diffuse large B-cell lymphoma    Review of Systems As per HPI  Past Medical History:  Diagnosis Date   AICD (automatic cardioverter/defibrillator) present dual   [employer]tr[employer] ---  original placedment 2007/  generator change 2014 by dr danelle taylor   Anemia    Anticoagulant long-term use    eliquis    Anxiety    Arthralgia of multiple joints    Arthritis pain    Benign hypertensive heart disease    CAD (coronary artery disease) primary cardiologist-- dr danelle taylor   MI and 2 stents 1995 in [place]    Cancer [facility])    Cataract ?   Surgery 2020   CHF NYHA class II, chronic, systolic (HCC)    followed by dr danelle taylor   Complication of anesthesia    Degenerative disc disease, lumbar    Depression    Difficult intubation    Dyslipidemia    History of basal cell carcinoma (BCC) excision    right ankle area s/p  excision in office 06/ 2019  in office   History of DVT of lower extremity yrs ago before 2012   History of MI ([place]ocardial infarction) 1995  in  [place]   History of pulmonary embolus (PE) 2012   History of ventricular tachycardia    Hyperlipidemia    Hypersomnia    Hypertension    Ischemic cardiomyopathy    Migraines    Myocardial infarction (HCC)    OA (osteoarthritis)    all over   PAF (paroxysmal atrial fibrillation) (HCC)    Pneumonia    02/2023   Primary localized osteoarthritis of right hip  12/18/2017   Primary localized osteoarthritis of right knee 05/28/2018   S/P coronary artery stent placement 1995   in [facility]   05-21-2018 per pt x2  stents in same coronary artery (unsure BM or DES)   Vitamin D  deficiency disease     Family History  Problem Relation Age of Onset   Hypertension Mother    Thyroid  disease Mother    Alzheimer's disease Mother    Hearing loss Mother    Coronary artery disease Father    Pulmonary embolism Father    Arthritis Father    Heart disease Father    Congestive Heart Failure Maternal Grandmother    Hypertension Maternal Grandmother    Heart attack Maternal Grandfather    Other Maternal Grandfather        carotid disease   Dementia Paternal Grandmother    Other Paternal Grandfather 63       accident   Cancer Sister      Social History   Socioeconomic History   Marital status: Single    Spouse name: Not on file   Number of children: 0   Years of education: 14   Highest education level: Not on file  Occupational History   Not on file  Tobacco Use   Smoking status: Former    Current packs/day: 0.00    Types: Cigarettes    Start date: 05/21/1966    Quit date: 05/21/1996    Years since quitting: 27.8   Smokeless tobacco: Never  Vaping Use   Vaping status: Never Used  Substance and Sexual Activity   Alcohol use: No   Drug use: Never   Sexual activity: Not on file  Other Topics Concern   Not on file  Social History Narrative   Not on file   Social Drivers of Health   Financial Resource Strain: Low Risk  (05/28/2018)   Overall Financial Resource Strain (CARDIA)    Difficulty of Paying Living Expenses: Not hard at all  Food Insecurity: No Food Insecurity (08/28/2023)   Hunger Vital Sign    Worried About Running Out of Food in the Last Year: Never true    Ran Out of Food in the Last Year: Never true  Transportation Needs: No Transportation Needs (08/28/2023)   PRAPARE - Administrator, Civil Service (Medical): No     Lack of Transportation (Non-Medical): No  Physical Activity: Not on file  Stress: Not on file  Social Connections: Not on file  Intimate Partner Violence: Not At Risk (08/28/2023)   Humiliation, Afraid, Rape, and Kick questionnaire    Fear of Current or Ex-Partner: No    Emotionally Abused: No    Physically Abused: No    Sexually Abused: No    Allergies  Allergen Reactions   Sumatriptan Succinate Other (See Comments)    Chest pain, no triptans, pt states it makes my heart race    Current Outpatient Medications on File Prior to Visit  Medication Sig Dispense Refill   acetaminophen  (TYLENOL ) 650 MG CR tablet Take 1,300 mg by  mouth every 8 (eight) hours as needed for pain.     b complex vitamins capsule Take 1 capsule by mouth daily.     carvedilol  (COREG ) 12.5 MG tablet Take 12.5 mg by mouth 2 (two) times daily with a meal.      Cholecalciferol  (VITAMIN D3) 125 MCG (5000 UT) capsule Take 5,000 Units by mouth daily.     cyanocobalamin  (VITAMIN B12) 1000 MCG tablet Take 1,000 mcg by mouth daily.     ELIQUIS  5 MG TABS tablet TAKE 1 TABLET BY MOUTH TWICE A DAY 60 tablet 5   FIBER PO Take 1 capsule by mouth 2 (two) times daily.     gabapentin  (NEURONTIN ) 300 MG capsule Take 300 mg by mouth 3 (three) times daily.     HYDROcodone -acetaminophen  (NORCO/VICODIN) 5-325 MG tablet Take 1 tablet by mouth every 6 (six) hours as needed for severe pain (pain score 7-10). 10 tablet 0   loratadine  (CLARITIN ) 10 MG tablet Take 10 mg by mouth daily.     prochlorperazine  (COMPAZINE ) 10 MG tablet Take 1 tablet (10 mg total) by mouth every 6 (six) hours as needed for nausea or vomiting. 30 tablet 1   ramipril  (ALTACE ) 2.5 MG tablet Take 2.5 mg by mouth daily.       simvastatin  (ZOCOR ) 40 MG tablet Take 40 mg by mouth at bedtime.       sucralfate  (CARAFATE ) 1 g tablet TAKE 1 TABLET (1 G TOTAL) BY MOUTH 4 (FOUR) TIMES DAILY - WITH MEALS AND AT BEDTIME. DISSOLVE TAB IN 5-10 ML OF WATER  TO MAKE A THICK SLURRY  AND THEN SWALLOW IT. 270 tablet 1   tiZANidine  (ZANAFLEX ) 4 MG tablet Take 2 mg by mouth daily as needed for muscle spasms.     traZODone  (DESYREL ) 50 MG tablet Take 50 mg by mouth at bedtime.      venlafaxine  (EFFEXOR ) 75 MG tablet Take 75 mg by mouth daily.     vitamin E  400 UNIT capsule Take 400 Units by mouth daily.     zonisamide  (ZONEGRAN ) 50 MG capsule Take 50-100 mg by mouth See admin instructions. Take 50mg  by mouth in the morning and 100mg  at night.     Calcium  Carb-Cholecalciferol  (CALCIUM  600 + D PO) Take 1 tablet by mouth daily. (Patient not taking: Reported on 03/05/2024)     lidocaine  (XYLOCAINE ) 2 % solution Use as directed 15 mLs in the mouth or throat as needed for mouth pain. (Patient not taking: Reported on 03/05/2024) 100 mL 0   magic mouthwash w/lidocaine  SOLN Take 5 mLs by mouth 4 (four) times daily as needed for mouth pain. Suspension contains equal amounts of Maalox Extra Strength, nystatin , diphenhydramine  and lidocaine . (Patient not taking: Reported on 03/05/2024) 400 mL 0   nystatin  (MYCOSTATIN ) 100000 UNIT/ML suspension Take 5 mLs (500,000 Units total) by mouth 4 (four) times daily. (Patient not taking: Reported on 03/05/2024) 100 mL 0   Omega-3 Fatty Acids (FISH OIL) 1000 MG CAPS Take 1,000 mg by mouth daily.  (Patient not taking: Reported on 03/05/2024)     ondansetron  (ZOFRAN ) 8 MG tablet Take 1 tablet (8 mg total) by mouth every 8 (eight) hours as needed for nausea or vomiting. (Patient not taking: Reported on 03/05/2024) 30 tablet 1   No current facility-administered medications on file prior to visit.       Objective:    Vitals:   03/05/24 1323  BP: (!) 93/59  Pulse: 83  SpO2: 94%  Weight: 153 lb 12.8  oz (69.8 kg)  Height: 5' 7 (1.702 m)   Physical Exam Gen: Pleasant, well-nourished, in no distress,  normal affect  ENT: No lesions,  mouth clear,  oropharynx clear, no postnasal drip  Neck: No JVD, no stridor  Lungs: No use of accessory muscles, no  crackles or wheezing on normal respiration, no wheeze on forced expiration  Cardiovascular: RRR, heart sounds normal, no murmur or gallops, no peripheral edema  Musculoskeletal: No deformities, no cyanosis or clubbing  Neuro: alert, awake, non focal  Skin: Warm, no lesions or rashes       Assessment & Plan:   Assessment & Plan Lung nodule  Mediastinal adenopathy   Assessment and Plan Assessment & Plan Right upper lobe squamous cell lung cancer, status post radiation and chemotherapy, with new pulmonary nodules and mediastinal lymphadenopathy suspicious for recurrence or metastasis Recent imaging showed decreased anterior parahilar right upper lobe lesion but increased dense ground glass opacity, likely radiation pneumonitis. Increased nodules in right apex and enlarged right pretracheal, paratracheal, and supraclavicular nodes suggest nodal metastasis. Small right apical nodule and 7-8 mm left upper lobe nodule with hypermetabolism noted. Differential includes recurrence or metastasis versus post-radiation changes. - Perform bronchoscopy to biopsy accessible nodules and lymph nodes. - Schedule bronchoscopy for September 8th or 9th, contingent on post-defibrillator removal recovery.  Atrial fibrillation, on anticoagulation On Eliquis  for atrial fibrillation. Risk of being off anticoagulation is low; benefits of tissue diagnosis outweigh risks. - Stop Eliquis  for two days prior to bronchoscopy.     No follow-ups on file.   Lamar Chris, MD, PhD 03/05/2024, 2:01 PM [facility] [phone] or if no answer before 7:00PM call [phone] For any issues after 7:00PM please call eLink [phone]

## 2024-03-05 NOTE — Telephone Encounter (Signed)
 Bronch scheduled at 03/17/24 at 12:45 pm. Routing to [place] for [employer]

## 2024-03-05 NOTE — Patient Instructions (Signed)
  VISIT SUMMARY: Today, we reviewed your recent imaging results and discussed the findings related to your lung cancer and lymphoma. We also talked about your upcoming procedures and how to manage your medications around these events.  YOUR PLAN: -RIGHT UPPER LOBE SQUAMOUS CELL LUNG CANCER: Your lung cancer has shown some changes on recent imaging, including a decrease in one lesion but an increase in other areas, which may suggest recurrence or metastasis. We will perform a bronchoscopy to biopsy the nodules and lymph nodes to get more information. This procedure is scheduled for September 8th or 9th, depending on your recovery from the defibrillator removal.  -ATRIAL FIBRILLATION: Atrial fibrillation is an irregular and often rapid heart rate. You are currently taking Eliquis  to manage this condition. You will need to stop taking Eliquis  for two days before your bronchoscopy to reduce the risk of bleeding during the procedure.  INSTRUCTIONS: Please stop taking Eliquis  for two days before your bronchoscopy. Your bronchoscopy is scheduled for September 8th or 9th, depending on your recovery from the defibrillator removal on September 5th.

## 2024-03-05 NOTE — Telephone Encounter (Signed)
 Please schedule the following:  Provider performing procedure: [address] Diagnosis: Bilateral pulmonary nodules, mediastinal adenopathy Which side if for nodule / mass?  Bilateral Procedure: Robotic assisted navigational bronchoscopy, endobronchial ultrasound Has patient been spoken to by Provider and given informed consent?  Yes Anesthesia: General Do you need Fluro?  Yes Duration of procedure: 60 minutes Date: 03/17/2024 Alternate Date: 03/18/2024 Time: Any Location: [facility] Does patient have OSA?  No DM?  No or Latex allergy?  No Medication Restriction/ Anticoagulate/Antiplatelet: Stop Eliquis  2 days prior Pre-op Labs Ordered:determined by Anesthesia Imaging request: CT chest is available in the PACS (If, SuperDimension CT Chest, please have STAT courier sent to ENDO)

## 2024-03-07 ENCOUNTER — Encounter (HOSPITAL_COMMUNITY): Payer: Self-pay

## 2024-03-12 ENCOUNTER — Inpatient Hospital Stay

## 2024-03-12 ENCOUNTER — Encounter: Payer: Self-pay | Admitting: Hematology

## 2024-03-12 ENCOUNTER — Inpatient Hospital Stay: Attending: Hematology

## 2024-03-12 ENCOUNTER — Inpatient Hospital Stay (HOSPITAL_BASED_OUTPATIENT_CLINIC_OR_DEPARTMENT_OTHER): Admitting: Hematology

## 2024-03-12 ENCOUNTER — Other Ambulatory Visit: Payer: Self-pay

## 2024-03-12 VITALS — BP 133/65 | HR 85 | Temp 98.1°F | Resp 18 | Ht 67.0 in | Wt 149.4 lb

## 2024-03-12 DIAGNOSIS — Z7189 Other specified counseling: Secondary | ICD-10-CM

## 2024-03-12 DIAGNOSIS — Z79620 Long term (current) use of immunosuppressive biologic: Secondary | ICD-10-CM | POA: Insufficient documentation

## 2024-03-12 DIAGNOSIS — C3491 Malignant neoplasm of unspecified part of right bronchus or lung: Secondary | ICD-10-CM

## 2024-03-12 DIAGNOSIS — Z87891 Personal history of nicotine dependence: Secondary | ICD-10-CM | POA: Diagnosis not present

## 2024-03-12 DIAGNOSIS — Z5112 Encounter for antineoplastic immunotherapy: Secondary | ICD-10-CM | POA: Diagnosis not present

## 2024-03-12 DIAGNOSIS — C3411 Malignant neoplasm of upper lobe, right bronchus or lung: Secondary | ICD-10-CM | POA: Insufficient documentation

## 2024-03-12 DIAGNOSIS — G5601 Carpal tunnel syndrome, right upper limb: Secondary | ICD-10-CM | POA: Diagnosis not present

## 2024-03-12 LAB — CBC WITH DIFFERENTIAL (CANCER CENTER ONLY)
Abs Immature Granulocytes: 0 K/uL (ref 0.00–0.07)
Basophils Absolute: 0 K/uL (ref 0.0–0.1)
Basophils Relative: 0 %
Eosinophils Absolute: 0.1 K/uL (ref 0.0–0.5)
Eosinophils Relative: 2 %
HCT: 43.1 % (ref 36.0–46.0)
Hemoglobin: 15.1 g/dL — ABNORMAL HIGH (ref 12.0–15.0)
Immature Granulocytes: 0 %
Lymphocytes Relative: 14 %
Lymphs Abs: 0.6 K/uL — ABNORMAL LOW (ref 0.7–4.0)
MCH: 31.3 pg (ref 26.0–34.0)
MCHC: 35 g/dL (ref 30.0–36.0)
MCV: 89.2 fL (ref 80.0–100.0)
Monocytes Absolute: 0.5 K/uL (ref 0.1–1.0)
Monocytes Relative: 13 %
Neutro Abs: 2.8 K/uL (ref 1.7–7.7)
Neutrophils Relative %: 71 %
Platelet Count: 94 K/uL — ABNORMAL LOW (ref 150–400)
RBC: 4.83 MIL/uL (ref 3.87–5.11)
RDW: 14.7 % (ref 11.5–15.5)
WBC Count: 4 K/uL (ref 4.0–10.5)
nRBC: 0 % (ref 0.0–0.2)

## 2024-03-12 LAB — CMP (CANCER CENTER ONLY)
ALT: 16 U/L (ref 0–44)
AST: 19 U/L (ref 15–41)
Albumin: 4.3 g/dL (ref 3.5–5.0)
Alkaline Phosphatase: 84 U/L (ref 38–126)
Anion gap: 6 (ref 5–15)
BUN: 14 mg/dL (ref 8–23)
CO2: 26 mmol/L (ref 22–32)
Calcium: 9.5 mg/dL (ref 8.9–10.3)
Chloride: 108 mmol/L (ref 98–111)
Creatinine: 0.62 mg/dL (ref 0.44–1.00)
GFR, Estimated: >60 mL/min (ref >60)
Glucose, Bld: 92 mg/dL (ref 70–99)
Potassium: 4.2 mmol/L (ref 3.5–5.1)
Sodium: 140 mmol/L (ref 135–145)
Total Bilirubin: 0.6 mg/dL (ref 0.0–1.2)
Total Protein: 7.1 g/dL (ref 6.5–8.1)

## 2024-03-12 LAB — TSH: TSH: 2.16 u[IU]/mL (ref 0.350–4.500)

## 2024-03-12 MED ORDER — SODIUM CHLORIDE 0.9 % IV SOLN
10.0000 mg/kg | Freq: Once | INTRAVENOUS | Status: AC
  Administered 2024-03-12: 740 mg via INTRAVENOUS
  Filled 2024-03-12: qty 4.8

## 2024-03-12 MED ORDER — SODIUM CHLORIDE 0.9 % IV SOLN
INTRAVENOUS | Status: DC
Start: 2024-03-12 — End: 2024-03-12

## 2024-03-12 NOTE — Patient Instructions (Signed)

## 2024-03-13 LAB — T4: T4, Total: 6.9 ug/dL (ref 4.5–12.0)

## 2024-03-13 NOTE — Pre-Procedure Instructions (Signed)
 Attempted to call patient regarding procedure instructions.  Left voicemail on the following items: Arrival time 0515 Nothing to eat or drink after midnight No meds AM of procedure Responsible person to drive you home and stay with you for 24 hrs  Have you missed any doses of anti-coagulant Eliquis - should have stopped it on Tuesday 9/2.

## 2024-03-14 ENCOUNTER — Ambulatory Visit (HOSPITAL_COMMUNITY)
Admission: RE | Admit: 2024-03-14 | Discharge: 2024-03-14 | Disposition: A | Discharge status: Home / Self Care | Attending: Internal Medicine | Admitting: Internal Medicine

## 2024-03-14 ENCOUNTER — Encounter (HOSPITAL_COMMUNITY): Payer: Self-pay | Admitting: Emergency Medicine

## 2024-03-14 ENCOUNTER — Ambulatory Visit (HOSPITAL_COMMUNITY): Admitting: Vascular Surgery

## 2024-03-14 ENCOUNTER — Other Ambulatory Visit: Payer: Self-pay

## 2024-03-14 ENCOUNTER — Encounter (HOSPITAL_COMMUNITY)
Admission: RE | Disposition: A | Discharge status: Home / Self Care | Payer: Self-pay | Source: Home / Self Care | Attending: Internal Medicine

## 2024-03-14 DIAGNOSIS — C83398 Diffuse large b-cell lymphoma of other extranodal and solid organ sites: Secondary | ICD-10-CM | POA: Insufficient documentation

## 2024-03-14 DIAGNOSIS — Z79899 Other long term (current) drug therapy: Secondary | ICD-10-CM | POA: Diagnosis not present

## 2024-03-14 DIAGNOSIS — I252 Old myocardial infarction: Secondary | ICD-10-CM | POA: Insufficient documentation

## 2024-03-14 DIAGNOSIS — I255 Ischemic cardiomyopathy: Secondary | ICD-10-CM | POA: Insufficient documentation

## 2024-03-14 DIAGNOSIS — C3490 Malignant neoplasm of unspecified part of unspecified bronchus or lung: Secondary | ICD-10-CM | POA: Diagnosis not present

## 2024-03-14 DIAGNOSIS — I2510 Atherosclerotic heart disease of native coronary artery without angina pectoris: Secondary | ICD-10-CM | POA: Insufficient documentation

## 2024-03-14 DIAGNOSIS — Z4502 Encounter for adjustment and management of automatic implantable cardiac defibrillator: Secondary | ICD-10-CM

## 2024-03-14 DIAGNOSIS — I5022 Chronic systolic (congestive) heart failure: Secondary | ICD-10-CM | POA: Insufficient documentation

## 2024-03-14 DIAGNOSIS — Z87891 Personal history of nicotine dependence: Secondary | ICD-10-CM | POA: Insufficient documentation

## 2024-03-14 DIAGNOSIS — I110 Hypertensive heart disease with heart failure: Secondary | ICD-10-CM | POA: Diagnosis not present

## 2024-03-14 HISTORY — PX: ICD GENERATOR REMOVAL: EP1232

## 2024-03-14 MED ORDER — MIDAZOLAM HCL 2 MG/2ML IJ SOLN
INTRAMUSCULAR | Status: AC
  Filled 2024-03-14: qty 2

## 2024-03-14 MED ORDER — LIDOCAINE HCL (PF) 1 % IJ SOLN
INTRAMUSCULAR | Status: AC
  Filled 2024-03-14: qty 30

## 2024-03-14 MED ORDER — SODIUM CHLORIDE 0.9 % IV SOLN
INTRAVENOUS | Status: DC
  Administered 2024-03-14: 250 mL via INTRAVENOUS

## 2024-03-14 MED ORDER — CHLORHEXIDINE GLUCONATE 4 % EX SOLN: 4.0000 | Freq: Once | CUTANEOUS | Status: DC

## 2024-03-14 MED ORDER — MIDAZOLAM HCL 5 MG/5ML IJ SOLN
INTRAMUSCULAR | Status: DC | PRN
  Administered 2024-03-14: 1 mg via INTRAVENOUS
  Administered 2024-03-14: 2 mg via INTRAVENOUS

## 2024-03-14 MED ORDER — FENTANYL CITRATE (PF) 100 MCG/2ML IJ SOLN
INTRAMUSCULAR | Status: DC | PRN
Start: 2024-03-14 — End: 2024-03-14
  Administered 2024-03-14: 25 ug via INTRAVENOUS
  Administered 2024-03-14: 12.5 ug via INTRAVENOUS

## 2024-03-14 MED ORDER — SODIUM CHLORIDE 0.9 % IV SOLN
INTRAVENOUS | Status: AC
  Administered 2024-03-14: 80 mg
  Filled 2024-03-14: qty 2

## 2024-03-14 MED ORDER — FENTANYL CITRATE (PF) 100 MCG/2ML IJ SOLN
INTRAMUSCULAR | Status: AC
Start: 2024-03-14 — End: 2024-03-14
  Filled 2024-03-14: qty 2

## 2024-03-14 MED ORDER — POVIDONE-IODINE 10 % EX SWAB
2.0000 | Freq: Once | CUTANEOUS | Status: AC
  Administered 2024-03-14: 2 via TOPICAL

## 2024-03-14 MED ORDER — SODIUM CHLORIDE 0.9 % IV SOLN: 80.0000 mg | INTRAVENOUS | Status: AC

## 2024-03-14 MED ORDER — CEFAZOLIN SODIUM-DEXTROSE 2-4 GM/100ML-% IV SOLN
INTRAVENOUS | Status: AC
  Administered 2024-03-14: 2 g via INTRAVENOUS
  Filled 2024-03-14: qty 100

## 2024-03-14 MED ORDER — ONDANSETRON HCL 4 MG/2ML IJ SOLN: 4.0000 mg | Freq: Four times a day (QID) | INTRAMUSCULAR | Status: DC | PRN

## 2024-03-14 MED ORDER — CEFAZOLIN SODIUM-DEXTROSE 2-4 GM/100ML-% IV SOLN: 2.0000 g | INTRAVENOUS | Status: AC

## 2024-03-14 MED ORDER — ACETAMINOPHEN 325 MG PO TABS: 325.0000 mg | ORAL_TABLET | ORAL | Status: DC | PRN

## 2024-03-14 MED ORDER — LIDOCAINE HCL (PF) 1 % IJ SOLN
INTRAMUSCULAR | Status: DC | PRN
  Administered 2024-03-14: 10 mL
  Administered 2024-03-14: 30 mL

## 2024-03-14 NOTE — Anesthesia Preprocedure Evaluation (Addendum)
 Anesthesia Evaluation  Patient identified by MRN, date of birth, ID band Patient awake    Reviewed: Allergy & Precautions, NPO status , Patient's Chart, lab work & pertinent test results, reviewed documented beta blocker date and time   History of Anesthesia Complications (+) DIFFICULT AIRWAY and history of anesthetic complications  Airway Mallampati: III  TM Distance: >3 FB     Dental  (+) Upper Dentures, Lower Dentures   Pulmonary neg shortness of breath, pneumonia, former smoker   breath sounds clear to auscultation       Cardiovascular hypertension, + CAD, + Past MI, + Cardiac Stents and +CHF  (-) CABG + dysrhythmias Atrial Fibrillation and Ventricular Tachycardia  Rhythm:Regular Rate:Normal  Defibrillator removed   Neuro/Psych  Headaches, neg Seizures PSYCHIATRIC DISORDERS Anxiety Depression       GI/Hepatic ,,,(+) neg Cirrhosis        Endo/Other    Renal/GU Renal disease     Musculoskeletal  (+) Arthritis ,    Abdominal   Peds  Hematology  (+) Blood dyscrasia, anemia   Anesthesia Other Findings   Reproductive/Obstetrics                              Anesthesia Physical Anesthesia Plan  ASA: 3  Anesthesia Plan: General   Post-op Pain Management:    Induction: Intravenous  PONV Risk Score and Plan: 2 and Ondansetron   Airway Management Planned: Oral ETT  Additional Equipment:   Intra-op Plan:   Post-operative Plan: Extubation in OR  Informed Consent: I have reviewed the patients History and Physical, chart, labs and discussed the procedure including the risks, benefits and alternatives for the proposed anesthesia with the patient or authorized representative who has indicated his/her understanding and acceptance.     Dental advisory given  Plan Discussed with: CRNA  Anesthesia Plan Comments: (See PAT note written 03/14/2024 by Allison Zelenak, PA-C. ICD generator  extracted 03/14/2024.   )         Anesthesia Quick Evaluation

## 2024-03-14 NOTE — Progress Notes (Signed)
 SDW CALL  Patient and daughter were given pre-op instructions while patient was in procedural after having ICD removed. The opportunity was given for the patient and daughter to ask questions. No further questions asked. Patient and daughter verbalized understanding of instructions given.   PCP - Glenis O'Bush Cardiologist - Dr. Danelle Birmingham Oncologist - Dr. Emaline Saran  PPM/ICD - ICD - removed on 03/14/24 Device Orders - n/a Rep Notified -  n/a  Chest CT- 02/13/24 EKG - 01/30/24 Stress Test -  ECHO - 02/15/23 Cardiac Cath - 1956   Sleep Study - denies  No DM  Last dose of GLP1 agonist-  n/a GLP1 instructions:  n/a  Blood Thinner Instructions: last dose of Eliquis  was 9/2 Aspirin  Instructions: n/a  ERAS Protcol - NPO PRE-SURGERY Ensure or G2-  n/a  COVID TEST- n/a   Anesthesia review: yes - cardiac history  Patient denies shortness of breath, fever, cough and chest pain over the phone call   All instructions explained to the patient, with a verbal understanding of the material. Patient agrees to go over the instructions while at home for a better understanding.

## 2024-03-14 NOTE — Progress Notes (Signed)
 Patient and daughter was given discharge instructions. Both verbalized understanding.

## 2024-03-14 NOTE — Progress Notes (Signed)
 Anesthesia Chart Review: SAME DAY WORK-UP  Case: [id number] Date/Time: 03/17/24 1230   Procedures:      VIDEO BRONCHOSCOPY WITH ENDOBRONCHIAL NAVIGATION (Bilateral) - need EBUS scope also     ENDOBRONCHIAL ULTRASOUND (EBUS) (Bilateral)   Anesthesia type: General   Diagnosis:      Lung nodule [R91.1]     Mediastinal adenopathy [R59.0]   Pre-op diagnosis: bilateral pulm nodules, mediastinal adenopathy   [facility]   Surgeons: Shelah Lamar RAMAN, MD       DISCUSSION: Patient is a 78 year old female scheduled for the above procedure. She is s/p removal of ICD generator on 03/14/2024 (see below for more details).    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 She previously had a Medtronic ICD placed in 2007 for cardiomyopathy/HFrEF, with generator change on 02/04/2013, and removal on 03/14/2024 per patient preference after device reached ERI and EF was improved to 50% (see below).    Regarding difficult intubation: On 08/07/2023 she was intubated with 8.5 mm ETT using Glidescope and 3, IV induction, mask ventilation without difficulty. Grade 1 view. 1 attempt. Glidescope and 3 also used to place 6.5 mm ETT on 10/04/2022.   She follows with cardiology for history of CAD (s/p LCX stent 1995), PE (2012), HTN, ICM (s/p Medtronic ICD), PAF, NSVT (s/p  ablation). At 01/30/2024 visit with Dr. Waddell he discuss that her ICD had reached ERI.  He wrote, She is being treated for 2 malignancies, including squamous cell of the lung as well as diffuse large cell lymphoma. She has reached end of service on her ICD. Her EF was 50% and she has never had therapies and has decided not to place a new device. Her current device bothers her and she would like to have the can removed.SABRASABRASABRAI discussed the treatment options and we will tentatively plan to hold off on ICD gen change. However she would like to have the can removed as it is bothering her. We will do so but not the leads. She understands that there is a small risk of infection from the procedure. She had been pacing 5%, but her device was reprogrammed to VVI 30/min and had not had any pacing. S/p ICD generator extraction on 03/14/2024.    She had preoperative cardiology risk assessment (for right CTR on 02/26/2024). She was able to complete > 4 METS activity at that time and was given permission to temporarily hold Eliquis  without a Lovenox  bridge for that procedure. Last dose Eliquis  03/11/2024 (as it had to be held for ICD extraction).   She follows with oncologist Dr. Onesimo for history of diffuse large B-cell lymphoma and non-small cell lung cancer. She underwent chemotherapy for lymphoma in 2024 and chemoradiation for lung cancer in 2025. Most recently she has been on durvalumab  every 2 weeks for lung cancer.  Last office visit 02/27/2024. Her last chest CT was on 02/13/2024 and showed decreased in size of her treated anterior right perihilar upper lobe, increased ground glass opacity  throughout the perihilar right lung consistent with radiation pneumonitis, increase in size of right apical nodules, interval enlargement of the high right pre/para tracheal and supraclavicular lymph nodes concerning for worsening nodule metastatic disease, and overall mixed response to treatment. He discussed results with patient who was  agreeable to referral to Dr. Shelah for consideration of bronchoscopy with biopsy of one of the upper mediastinal lymph nodes to evaluate for whether these are reactive due to radiation pneumonitis versus related to her squamous cell carcinoma of the lung versus her recently treated diffuse large B-cell lymphoma. The pathology will help direct further treatment.    She had CBC, TSH, Free T4, and CMP through [facility] on 03/12/2024.     Anesthesia team to evaluate on the day of surgery.    VS:  Wt Readings from Last 3 Encounters:  03/14/24 68 kg  03/12/24 67.8 kg  03/05/24 69.8 kg   BP Readings from Last 3 Encounters:  03/14/24 134/65  03/12/24 133/65  03/05/24 (!) 93/59   Pulse Readings from Last 3 Encounters:  03/14/24 69  03/12/24 85  03/05/24 83    PROVIDERS: O'Buch, Greta, PA-C is PCP  Waddell Lusher, MD is EP cardiologist Onesimo Karst, MD is HEM-ONC Jomarie Agent, MD is RAD-ONC Shelah Charleston, MD is pulmonologist   LABS: Most recent lab results from 03/12/2024 include: Lab Results  Component Value Date   WBC 4.0 03/12/2024   HGB 15.1 (H) 03/12/2024   HCT 43.1 03/12/2024   PLT 94 (L) 03/12/2024   GLUCOSE 92 03/12/2024   ALT 16 03/12/2024   AST 19 03/12/2024   NA 140 03/12/2024   K 4.2 03/12/2024   CL 108 03/12/2024   CREATININE 0.62 03/12/2024   BUN 14 03/12/2024   CO2 26 03/12/2024   TSH 2.160 03/12/2024    IMAGES: CT Chest 02/13/2024: IMPRESSION: 1. Continued decrease in size of a treated mass in the anterior perihilar right upper lobe with a biopsy marking clip measuring 1.8 x 0.9 cm, previously 2.3 x 1.1 cm. 2. Significant interval increase in relatively dense ground-glass airspace opacity throughout the perihilar right lung, particularly in the inferior right upper lobe, consistent with radiation pneumonitis. 3. Interval increase in size of nodules throughout the right apex. Unchanged clustered nodules in the peripheral posterior left upper lobe. 4.  Interval enlargement of high right pretracheal, paratracheal, and supraclavicular nodes, previously FDG avid, consistent with worsened nodal metastatic disease. Similar appearance of matted, treated low pretracheal, subcarinal, and right hilar lymph nodes. 5. Overall mixed response to treatment. - Aortic Atherosclerosis (ICD10-I70.0).   CT Head 12/31/2023: IMPRESSION: 1. No change since the study of February. No evidence of intracranial metastatic disease. 2. Redemonstration of an area of architectural distortion in the left frontal bone immediately above the frontal sinus. There is no evidence of change or progression since the study of February. No second skull abnormality is seen. I agree that this was not present in 2012. If this does represent a bone metastasis, it is indolent/nonprogressive over this 4 month follow-up. There was no hypermetabolism visible at this location at the time of the PET scan, and I do not think this is definitely a malignant lesion. Additional follow-up would seem prudent.   PET Scan 6/232025: IMPRESSION: - Previous right upper lobe perihilar hypermetabolic mass and the previously seen nodes in the right hilum in mid mediastinum appears smaller and show less uptake. However there are new hypermetabolic nodes more superiorly in the right side of the mediastinum including paratracheal  region, thoracic inlet as well as in the low right neck, supraclavicular. - Left upper lobe nodular area with uptake is larger and shows more uptake today. - Overall mixed response. - No areas of abnormal uptake seen beneath the diaphragm.     EKG: 01/30/2024: Normal sinus rhythm with inferior MI, remote Cannot rule out Anterior infarct , age undetermined When compared with ECG of 14-Feb-2023 13:24, No significant change since last tracing Confirmed by Waddell Lusher [phone]) on 01/30/2024 10:21:18 AM     CV: Echo 02/15/2023: IMPRESSIONS:  1. Left ventricular ejection  fraction, by estimation, is 50%. The left  ventricle has low normal function. The left ventricle demonstrates  regional wall motion abnormalities unchanged in location from prior study  on side by side review. Left ventricular  diastolic parameters are consistent with Grade I diastolic dysfunction  (impaired relaxation).   2. Right ventricular systolic function is normal. The right ventricular  size is normal. There is mildly elevated pulmonary artery systolic  pressure. The estimated right ventricular systolic pressure is 42.9 mmHg.   3. The mitral valve is grossly normal. Trivial mitral valve  regurgitation. No evidence of mitral stenosis.   4. The aortic valve is normal in structure. Aortic valve regurgitation is  not visualized. No aortic stenosis is present.   5. The inferior vena cava is dilated in size with <50% respiratory  variability, suggesting right atrial pressure of 15 mmHg.  - Conclusion(s)/Recommendation(s): No left ventricular mural or apical  thrombus/thrombi.      Normal carotid artery US  on 04/18/2013.     Cardiac cath [facility], scanned under Media tab, Correspondence 04/11/2006): IMPRESSION: No significant CAD. There is 20-30% in-stent restenosis of the LCX stent. LVEF 40% with mid-distal inferior wall hypokinesis. Continue medical therapy.    Past Medical History:  Diagnosis Date   AICD (automatic cardioverter/defibrillator) present dual   Medtronic - original placed 2007, generator change 2014, generator extraction 03/14/2024   Anemia    Anticoagulant long-term use    eliquis    Anxiety    Arthralgia of multiple joints    Arthritis pain    Benign hypertensive heart disease    CAD (coronary artery disease) primary cardiologist-- dr lusher taylor   MI and 2 stents 1995 in [place]    Cancer [facility])    Cataract ?   Surgery 2020   CHF NYHA class II, chronic, systolic (HCC)    followed by dr lusher taylor   Complication of anesthesia     Degenerative disc disease, lumbar    Depression    Difficult intubation    Dyslipidemia    History of basal cell carcinoma (BCC) excision    right ankle area s/p  excision in office 06/ 2019  in office   History of DVT of lower extremity yrs ago before 2012   History of MI (myocardial infarction) 1995  in [place]   History of pulmonary embolus (PE) 2012   History of ventricular tachycardia    Hyperlipidemia    Hypersomnia    Hypertension    Ischemic cardiomyopathy    Migraines    Myocardial infarction (HCC)    OA (osteoarthritis)    all over   PAF (paroxysmal atrial fibrillation) (HCC)    Pneumonia    02/2023   Primary localized osteoarthritis of right hip 12/18/2017   Primary localized osteoarthritis of right knee 05/28/2018   S/P coronary artery stent placement 1995   in [facility] [place]   05-21-2018 per pt x2  stents  in same coronary artery (unsure BM or DES)   Vitamin D  deficiency disease     Past Surgical History:  Procedure Laterality Date   BIOPSY  06/19/2022   Procedure: BIOPSY;  Surgeon: Elicia Claw, MD;  Location: [facility] END[facility];  Service: Gastroenterology;;   BRONCHIAL BIOPSY  08/07/2023   Procedure: BRONCHIAL BIOPSIES;  Surgeon: Shelah Lamar RAMAN, MD;  Location: [facility] ENDOSCOPY;  Service: Pulmonary;;   BRONCHIAL BRUSHINGS  08/07/2023   Procedure: BRONCHIAL BRUSHINGS;  Surgeon: Shelah Lamar RAMAN, MD;  Location: [facility] ENDOSCOPY;  Service: Pulmonary;;   BRONCHIAL NEEDLE ASPIRATION BIOPSY  08/07/2023   Procedure: BRONCHIAL NEEDLE ASPIRATION BIOPSIES;  Surgeon: Shelah Lamar RAMAN, MD;  [facility]: [facility];  Service: Pulmonary;;   CARDIAC DEFIBRILLATOR PLACEMENT  2007   CARPAL TUNNEL RELEASE Right 02/26/2024   Procedure: CARPAL TUNNEL RELEASE;  Surgeon: Josefina Chew, MD;  Location: [facility] OR;  Service: Orthopedics;  Laterality: Right;   CATARACT EXTRACTION, BILATERAL     COLONOSCOPY  04/14/2013   colonic polyp, status post polypectomy. Mild panocolonic diverticulosis. Small internal  hemorrhoids   DILATION AND CURETTAGE OF UTERUS  yrs ago   DIRECT LARYNGOSCOPY Right 10/04/2022   Procedure: DIRECT LARYNGOSCOPY;  Surgeon: Llewellyn Gerard LABOR, DO;  Location: [facility] OR;  Service: ENT;  Laterality: Right;   ESOPHAGOGASTRODUODENOSCOPY (EGD) WITH PROPOFOL  N/A 06/19/2022   Procedure: ESOPHAGOGASTRODUODENOSCOPY (EGD) WITH PROPOFOL ;  Surgeon: Elicia Claw, MD;  Location: [facility];  Service: Gastroenterology;  Laterality: N/A;   EYE SURGERY  2020   Cataracts removed   FIDUCIAL MARKER PLACEMENT  08/07/2023   Procedure: FIDUCIAL MARKER PLACEMENT;  Surgeon: Shelah Lamar RAMAN, MD;  Location: [facility] ENDOSCOPY;  Service: Pulmonary;;   IMPACTION REMOVAL  06/19/2022   Procedure: IMPACTION REMOVAL;  Surgeon: Elicia Claw, MD;  Location: [facility];  Service: Gastroenterology;;   IMPLANTABLE CARDIOVERTER DEFIBRILLATOR GENERATOR CHANGE N/A 02/04/2013   Procedure: IMPLANTABLE CARDIOVERTER DEFIBRILLATOR GENERATOR CHANGE;  Surgeon: Danelle LELON Birmingham, MD;  Location: [facility] CATH LAB;  Service: Cardiovascular;  Laterality: N/A;   IR IMAGING GUIDED PORT INSERTION  10/27/2022   JOINT REPLACEMENT  2019, 2022, 2023   Both hips, both knees   LYMPH NODE BIOPSY Right 10/04/2022   Procedure: EXCISIONAL OF RIGHT DEEP CERVICAL LYMPH NODE;  Surgeon: Llewellyn Gerard LABOR, DO;  [facility];  Service: ENT;  Laterality: Right;   SHOULDER ARTHROSCOPY Right 2015   TOTAL HIP ARTHROPLASTY Right 12/18/2017   Procedure: RIGHT TOTAL HIP ARTHROPLASTY;  Surgeon: Josefina Chew, MD;  [facility];  Service: Orthopedics;  Laterality: Right;   TOTAL HIP ARTHROPLASTY Left 04/19/2021   Procedure: TOTAL HIP ARTHROPLASTY;  Surgeon: Josefina Chew, MD;  Location: [facility] OR[facility];  Service: Orthopedics;  Laterality: Left;   TOTAL KNEE ARTHROPLASTY Right 05/28/2018   Procedure: TOTAL KNEE ARTHROPLASTY;  Surgeon: Josefina Chew, MD;  Location: [facility];  Service: Orthopedics;  Laterality: Right;  Adductor Block   TOTAL KNEE ARTHROPLASTY  Left 08/12/2021   Procedure: TOTAL KNEE ARTHROPLASTY;  Surgeon: Josefina Chew, MD;  Location: [facility];  Service: Orthopedics;  Laterality: Left;   TUBAL LIGATION Bilateral yrs ago   VIDEO BRONCHOSCOPY WITH RADIAL ENDOBRONCHIAL ULTRASOUND  08/07/2023   Procedure: VIDEO BRONCHOSCOPY WITH RADIAL ENDOBRONCHIAL ULTRASOUND;  Surgeon: Shelah Lamar RAMAN, MD;  [facility];  Service: Pulmonary;;   WISDOM TOOTH EXTRACTION      MEDICATIONS: No current facility-administered medications for this encounter.   No current outpatient medications on file.    0.9 %  sodium chloride  infusion   acetaminophen  (TYLENOL )  tablet 325-650 mg   chlorhexidine  (HIBICLENS ) 4 % liquid 4 Application   fentaNYL  (SUBLIMAZE ) injection   lidocaine  (PF) (XYLOCAINE ) 1 % injection   midazolam  (VERSED ) 5 MG/5ML injection   ondansetron  (ZOFRAN ) injection 4 mg    Brilynn Biasi, PA-C Surgical Short Stay/Anesthesiology [facility] Phone [phone] [facility] Phone [phone] 03/14/2024 9:45 AM

## 2024-03-14 NOTE — Progress Notes (Signed)
 Surgical Instructions   Your procedure is scheduled on Monday, September 8th, 2025. Report to [facility] Main Entrance A at 10:00 A.M., then check in with the Admitting office. Any questions or running late day of surgery: call [phone]  Questions prior to your surgery date: call [phone], Monday-Friday, 8am-4pm. If you experience any cold or flu symptoms such as cough, fever, chills, shortness of breath, etc. between now and your scheduled surgery, please notify us  at the above number.     Remember:  Do not eat or drink after midnight the night before your surgery   Take these medicines the morning of surgery with A SIP OF WATER : Carvedilol  (Coreg ) Gabapentin  (Neurontin ) Loratadine  (Claritin ) Venlafaxine  (Effexor ) Zonisamide  (Zonegran )   May take these medicines IF NEEDED: Acetaminophen  (Tylenol ) Prochlorperazine  (Compazine ) Tizanidine  (Zanaflex )   Per Dr. Lanny instructions, Eliquis  should be held for 2 days prior to your procedure.  Your last dose should be on Friday, September 5th.      One week prior to surgery, STOP taking any Aspirin  (unless otherwise instructed by your surgeon) Aleve, Naproxen, Ibuprofen, Motrin, Advil, Goody's, BC's, all herbal medications, fish oil, and non-prescription vitamins.                     Do NOT Smoke (Tobacco/Vaping) for 24 hours prior to your procedure.  If you use a CPAP at night, you may bring your mask/headgear for your overnight stay.   You will be asked to remove any contacts, glasses, piercing's, hearing aid's, dentures/partials prior to surgery. Please bring cases for these items if needed.    Patients discharged the day of surgery will not be allowed to drive home, and someone needs to stay with them for 24 hours.  SURGICAL WAITING ROOM VISITATION Patients may have no more than 2 support people in the waiting area - these visitors may rotate.   Pre-op nurse will coordinate an appropriate time for 1 ADULT support  person, who may not rotate, to accompany patient in pre-op.  Children under the age of 6 must have an adult with them who is not the patient and must remain in the main waiting area with an adult.  If the patient needs to stay at the hospital during part of their recovery, the visitor guidelines for inpatient rooms apply.  Please refer to [facility] for the visitor guidelines for any additional information.   If you received a COVID test during your pre-op visit  it is requested that you wear a mask when out in public, stay away from anyone that may not be feeling well and notify your surgeon if you develop symptoms. If you have been in contact with anyone that has tested positive in the last 10 days please notify you surgeon.      Pre-operative CHG Bathing Instructions   You can play a key role in reducing the risk of infection after surgery. Your skin needs to be as free of germs as possible. You can reduce the number of germs on your skin by washing with CHG (chlorhexidine  gluconate) soap before surgery. CHG is an antiseptic soap that kills germs and continues to kill germs even after washing.   DO NOT use if you have an allergy to chlorhexidine /CHG or antibacterial soaps. If your skin becomes reddened or irritated, stop using the CHG and notify one of our RNs at [phone].              TAKE A SHOWER THE NIGHT  BEFORE SURGERY AND THE DAY OF SURGERY    Please keep in mind the following:  DO NOT shave, including legs and underarms, 48 hours prior to surgery.   You may shave your face before/day of surgery.  Place clean sheets on your bed the night before surgery Use a clean washcloth (not used since being washed) for each shower. DO NOT sleep with pet's night before surgery.  CHG Shower Instructions:  Wash your face and private area with normal soap. If you choose to wash your hair, wash first with your normal shampoo.  After you use shampoo/soap, rinse your hair and  body thoroughly to remove shampoo/soap residue.  Turn the water  OFF and apply half the bottle of CHG soap to a CLEAN washcloth.  Apply CHG soap ONLY FROM YOUR NECK DOWN TO YOUR TOES (washing for 3-5 minutes)  DO NOT use CHG soap on face, private areas, open wounds, or sores.  Pay special attention to the area where your surgery is being performed.  If you are having back surgery, having someone wash your back for you may be helpful. Wait 2 minutes after CHG soap is applied, then you may rinse off the CHG soap.  Pat dry with a clean towel  Put on clean pajamas    Additional instructions for the day of surgery: DO NOT APPLY any lotions, deodorants, cologne, or perfumes.   Do not wear jewelry or makeup Do not wear nail polish, gel polish, artificial nails, or any other type of covering on natural nails (fingers and toes) Do not bring valuables to the hospital. [facility] is not responsible for valuables/personal belongings. Put on clean/comfortable clothes.  Please brush your teeth.  Ask your nurse before applying any prescription medications to the skin.

## 2024-03-14 NOTE — H&P (Signed)
 HPI Ms. Rachel Bates returns today for followup. She is a pleasant 78 yo woman with a h/o CAD, s/p remote MI who underwent ICD insertion over 10 years ago. She has reached ERI on her ICD. 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 She has reached end of service on her ICD. Her EF was 50% and she has never had therapies and has decided not to place a new device. Her current device bothers her and she would like to have the can removed.  Allergies       Allergies  Allergen Reactions   Sumatriptan Succinate Other (See Comments)      Chest pain, no triptans, pt states it makes my heart race   Amitriptyline Other (See Comments)      Weight gain                Current Outpatient Medications  Medication Sig Dispense Refill   acetaminophen  (TYLENOL ) 650 MG CR tablet Take 1,300 mg by mouth every 8 (eight) hours as needed for pain.       b complex vitamins capsule Take 1 capsule by mouth daily.       Calcium  Carb-Cholecalciferol  (CALCIUM  600 + D PO) Take 1 tablet by mouth daily.       carvedilol  (COREG ) 12.5 MG tablet Take 12.5 mg by mouth 2 (two) times daily with a meal.        Cholecalciferol  (VITAMIN D3) 125 MCG (5000 UT) capsule Take 5,000 Units by mouth daily.       cyanocobalamin  (VITAMIN B12) 1000 MCG tablet Take 1,000 mcg by mouth daily.       ELIQUIS  5 MG TABS tablet TAKE 1 TABLET BY MOUTH TWICE A DAY 60 tablet 5   FIBER PO Take 1 capsule by mouth 2 (two) times daily.       Fiber POWD Take 1 Scoop by mouth daily.       gabapentin  (NEURONTIN ) 300 MG capsule Take 300 mg by mouth 3 (three) times daily.       lidocaine  (XYLOCAINE ) 2 % solution Use as directed 15 mLs in the mouth or throat as needed for mouth pain. 100 mL 0   lidocaine -prilocaine  (EMLA ) cream Apply to affected area once 30 g 3   loratadine  (CLARITIN ) 10 MG tablet Take 10 mg by mouth daily.       Omega-3 Fatty Acids (FISH OIL) 1000 MG CAPS  Take 1,000 mg by mouth daily.        prochlorperazine  (COMPAZINE ) 10 MG tablet Take 1 tablet (10 mg total) by mouth every 6 (six) hours as needed for nausea or vomiting. 30 tablet 1   ramipril  (ALTACE ) 2.5 MG tablet Take 2.5 mg by mouth daily.         simvastatin  (ZOCOR ) 40 MG tablet Take 40 mg by mouth at bedtime.         tiZANidine  (ZANAFLEX ) 4 MG tablet Take 2 mg by mouth daily as needed for muscle spasms.       traZODone  (DESYREL ) 50 MG tablet Take 50 mg by mouth at bedtime.        venlafaxine  (EFFEXOR ) 75 MG tablet Take 75 mg by mouth daily.       vitamin E  400 UNIT capsule Take 400 Units by mouth daily.       zonisamide  (ZONEGRAN ) 50 MG capsule Take 50-100 mg by mouth See  admin instructions. Take 50mg  by mouth in the morning and 100mg  at night.       magic mouthwash w/lidocaine  SOLN Take 5 mLs by mouth 4 (four) times daily as needed for mouth pain. Suspension contains equal amounts of Maalox Extra Strength, nystatin , diphenhydramine  and lidocaine . (Patient not taking: Reported on 01/30/2024) 400 mL 0   nystatin  (MYCOSTATIN ) 100000 UNIT/ML suspension Take 5 mLs (500,000 Units total) by mouth 4 (four) times daily. (Patient not taking: Reported on 01/30/2024) 100 mL 0   ondansetron  (ZOFRAN ) 8 MG tablet Take 1 tablet (8 mg total) by mouth every 8 (eight) hours as needed for nausea or vomiting. (Patient not taking: Reported on 01/30/2024) 30 tablet 1   sucralfate  (CARAFATE ) 1 g tablet TAKE 1 TABLET (1 G TOTAL) BY MOUTH 4 (FOUR) TIMES DAILY - WITH MEALS AND AT BEDTIME. DISSOLVE TAB IN 5-10 ML OF WATER  TO MAKE A THICK SLURRY AND THEN SWALLOW IT. (Patient not taking: Reported on 01/30/2024) 270 tablet 1      No current facility-administered medications for this visit.              Past Medical History:  Diagnosis Date   AICD (automatic cardioverter/defibrillator) present dual    [employer] ---  original placedment 2007/  generator change 2014 by dr danelle Jonnae Fonseca   Anemia     Anticoagulant long-term  use      eliquis    Anxiety     Arthralgia of multiple joints     Arthritis pain     Benign hypertensive heart disease     CAD (coronary artery disease) primary cardiologist-- dr danelle Laquincy Eastridge    MI and 2 stents 1995 in [place]    Cancer [facility])     CHF NYHA class II, chronic, systolic (HCC)      followed by dr danelle Kaysia Willard   Degenerative disc disease, lumbar     Depression     Dyslipidemia     History of basal cell carcinoma (BCC) excision      right ankle area s/p  excision in office 06/ 2019  in office   History of DVT of lower extremity yrs ago before 2012   History of MI (myocardial infarction) 1995  in [place]   History of pulmonary embolus (PE) 2012   History of ventricular tachycardia     Hyperlipidemia     Hypersomnia     Hypertension     Ischemic cardiomyopathy     Migraines     Myocardial infarction (HCC)     OA (osteoarthritis)      all over   PAF (paroxysmal atrial fibrillation) (HCC)     Pneumonia      02/2023   Primary localized osteoarthritis of right hip 12/18/2017   Primary localized osteoarthritis of right knee 05/28/2018   S/P coronary artery stent placement 1995   in [facility]    05-21-2018 per pt x2  stents in same coronary artery (unsure BM or DES)   Vitamin D  deficiency disease            ROS:    All systems reviewed and negative except as noted in the HPI.          Past Surgical History:  Procedure Laterality Date   BIOPSY   06/19/2022    Procedure: BIOPSY;  Surgeon: Elicia Claw, MD;  Location: [facility] ENDOSCOPY;  Service: Gastroenterology;;   BRONCHIAL BIOPSY   08/07/2023    Procedure: BRONCHIAL BIOPSIES;  Surgeon: Shelah Lamar RAMAN, MD;  Location: [facility] ENDOSCOPY;  Service: Pulmonary;;   BRONCHIAL BRUSHINGS   08/07/2023    Procedure: BRONCHIAL BRUSHINGS;  Surgeon: Shelah Lamar RAMAN, MD;  Location[facility] ENDOSCOPY;  Service: Pulmonary;;   BRONCHIAL NEEDLE ASPIRATION BIOPSY   08/07/2023    Procedure: BRONCHIAL NEEDLE ASPIRATION BIOPSIES;  Surgeon: Shelah Lamar RAMAN, MD;  Location: [facility] ENDOSCOPY;  Service: Pulmonary;;   CARDIAC DEFIBRILLATOR PLACEMENT   2007   CATARACT EXTRACTION, BILATERAL       COLONOSCOPY   04/14/2013    colonic polyp, status post polypectomy. Mild panocolonic diverticulosis. Small internal hemorrhoids   DILATION AND CURETTAGE OF UTERUS   yrs ago   DIRECT LARYNGOSCOPY Right 10/04/2022    Procedure: DIRECT LARYNGOSCOPY;  Surgeon: Llewellyn Gerard LABOR, DO;  Location: [facility] OR;  Service: ENT;  Laterality: Right;   ESOPHAGOGASTRODUODENOSCOPY (EGD) WITH PROPOFOL  N/A 06/19/2022    Procedure: ESOPHAGOGASTRODUODENOSCOPY (EGD) WITH PROPOFOL ;  Surgeon: Elicia Claw, MD;  Location: [facility] ENDOSCOPY;  Service: Gastroenterology;  Laterality: N/A;   FIDUCIAL MARKER PLACEMENT   08/07/2023    Procedure: FIDUCIAL MARKER PLACEMENT;  Surgeon: Shelah Lamar RAMAN, MD;  Location: [facility] ENDOSCOPY;  Service: Pulmonary;;   IMPACTION REMOVAL   06/19/2022    Procedure: IMPACTION REMOVAL;  Surgeon: Elicia Claw, MD;  Location: [facility] ENDOSCOPY;  Service: Gastroenterology;;   IMPLANTABLE CARDIOVERTER DEFIBRILLATOR GENERATOR CHANGE N/A 02/04/2013    Procedure: IMPLANTABLE CARDIOVERTER DEFIBRILLATOR GENERATOR CHANGE;  Surgeon: Danelle LELON Birmingham, MD;  Location: [facility] CATH LAB;  Service: Cardiovascular;  Laterality: N/A;   IR IMAGING GUIDED PORT INSERTION   10/27/2022   LYMPH NODE BIOPSY Right 10/04/2022    Procedure: EXCISIONAL OF RIGHT DEEP CERVICAL LYMPH NODE;  Surgeon: Llewellyn Gerard LABOR, DO;  Location: [facility] OR;  Service: ENT;  Laterality: Right;   SHOULDER ARTHROSCOPY Right 2015   TOTAL HIP ARTHROPLASTY Right 12/18/2017    Procedure: RIGHT TOTAL HIP ARTHROPLASTY;  Surgeon: Josefina Chew, MD;  Location: [facility] OR;  Service: Orthopedics;  Laterality: Right;   TOTAL HIP ARTHROPLASTY Left 04/19/2021    Procedure: TOTAL HIP ARTHROPLASTY;  Surgeon: Josefina Chew, MD;  Location: [facility] ORS;  Service: Orthopedics;  Laterality: Left;   TOTAL KNEE ARTHROPLASTY Right 05/28/2018    Procedure: TOTAL  KNEE ARTHROPLASTY;  Surgeon: Josefina Chew, MD;  Location: [facility] ORS;  Service: Orthopedics;  Laterality: Right;  Adductor Block   TOTAL KNEE ARTHROPLASTY Left 08/12/2021    Procedure: TOTAL KNEE ARTHROPLASTY;  Surgeon: Josefina Chew, MD;  Location: [facility] ORS;  Service: Orthopedics;  Laterality: Left;   TUBAL LIGATION Bilateral yrs ago   VIDEO BRONCHOSCOPY WITH RADIAL ENDOBRONCHIAL ULTRASOUND   08/07/2023    Procedure: VIDEO BRONCHOSCOPY WITH RADIAL ENDOBRONCHIAL ULTRASOUND;  Surgeon: Shelah Lamar RAMAN, MD;  Location: [facility] ENDOS[facility];  Service: Pulmonary;;   WISDOM TOOTH EXTRACTION                     Family History  Problem Relation Age of Onset   Hypertension Mother     Thyroid  disease Mother     Alzheimer's disease Mother     Coronary artery disease Father     Pulmonary embolism Father     Congestive Heart Failure Maternal Grandmother     Hypertension Maternal Grandmother     Heart attack Maternal Grandfather     Other Maternal Grandfather          carotid disease   Dementia Paternal Grandmother     Other Paternal Grandfather 36        accident  Social History         Socioeconomic History   Marital status: Single      Spouse name: Not on file   Number of children: 0   Years of education: 14   Highest education level: Not on file  Occupational History   Not on file  Tobacco Use   Smoking status: Former      Current packs/day: 0.00      Types: Cigarettes      Start date: 05/21/1966      Quit date: 05/21/1996      Years since quitting: 27.7   Smokeless tobacco: Never  Vaping Use   Vaping status: Never Used  Substance and Sexual Activity   Alcohol use: No   Drug use: Never   Sexual activity: Not on file  Other Topics Concern   Not on file  Social History Narrative   Not on file    Social Drivers of Health        Financial Resource Strain: Low Risk  (05/28/2018)    Overall Financial Resource Strain (CARDIA)     Difficulty of Paying Living Expenses: Not  hard at all  Food Insecurity: No Food Insecurity (08/28/2023)    Hunger Vital Sign     Worried About Running Out of Food in the Last Year: Never true     Ran Out of Food in the Last Year: Never true  Transportation Needs: No Transportation Needs (08/28/2023)    PRAPARE - Therapist, art (Medical): No     Lack of Transportation (Non-Medical): No  Physical Activity: Not on file  Stress: Not on file  Social Connections: Not on file  Intimate Partner Violence: Not At Risk (08/28/2023)    Humiliation, Afraid, Rape, and Kick questionnaire     Fear of Current or Ex-Partner: No     Emotionally Abused: No     Physically Abused: No     Sexually Abused: No        BP 132/66   Pulse 79   Ht 5' 7 (1.702 m)   Wt 152 lb 12.8 oz (69.3 kg)   SpO2 94%   BMI 23.93 kg/m    Physical Exam:   Well appearing NAD HEENT: Unremarkable Neck:  No JVD, no thyromegally Lymphatics:  No adenopathy Back:  No CVA tenderness Lungs:  Clear HEART:  Regular rate rhythm, no murmurs, no rubs, no clicks Abd:  soft, positive bowel sounds, no organomegally, no rebound, no guarding Ext:  2 plus pulses, no edema, no cyanosis, no clubbing Skin:  No rashes no nodules Neuro:  CN II through XII intact, motor grossly intact   EKG - nsr with inferior MI   DEVICE  Normal device function.  See PaceArt for details. EOS   Assess/Plan:   ICM - she has a repeat echo of 50%. I have discussed the treatment options and recommend watchful waiting.  ICD - she is at EOS on her device. I discussed the treatment options and we will tentatively plan to hold off on ICD gen change. However she would like to have the can removed as it is bothering her. We will do so but not the leads. She understands that there is a small risk of infection from the procedure. Sinus brady - she was pacing 5% and her device was reprogrammed her to VVI 30/min and she has not had any pacing.     Danelle Betsey Sossamon,MD

## 2024-03-14 NOTE — Discharge Instructions (Addendum)

## 2024-03-16 ENCOUNTER — Encounter (HOSPITAL_COMMUNITY): Payer: Self-pay | Admitting: Internal Medicine

## 2024-03-17 ENCOUNTER — Encounter (HOSPITAL_COMMUNITY): Payer: Self-pay | Admitting: Emergency Medicine

## 2024-03-17 ENCOUNTER — Other Ambulatory Visit: Payer: Self-pay

## 2024-03-17 ENCOUNTER — Ambulatory Visit (HOSPITAL_COMMUNITY)

## 2024-03-17 ENCOUNTER — Ambulatory Visit (HOSPITAL_COMMUNITY): Payer: Self-pay | Admitting: Vascular Surgery

## 2024-03-17 ENCOUNTER — Ambulatory Visit (HOSPITAL_COMMUNITY)
Admission: AD | Admit: 2024-03-17 | Discharge: 2024-03-17 | Disposition: A | Discharge status: Home / Self Care | Source: Ambulatory Visit | Attending: Emergency Medicine | Admitting: Emergency Medicine

## 2024-03-17 ENCOUNTER — Ambulatory Visit (HOSPITAL_BASED_OUTPATIENT_CLINIC_OR_DEPARTMENT_OTHER): Payer: Self-pay | Admitting: Vascular Surgery

## 2024-03-17 ENCOUNTER — Encounter (HOSPITAL_COMMUNITY)
Admission: AD | Disposition: A | Discharge status: Home / Self Care | Payer: Self-pay | Source: Ambulatory Visit | Attending: Emergency Medicine

## 2024-03-17 DIAGNOSIS — R911 Solitary pulmonary nodule: Secondary | ICD-10-CM | POA: Insufficient documentation

## 2024-03-17 DIAGNOSIS — I351 Nonrheumatic aortic (valve) insufficiency: Secondary | ICD-10-CM | POA: Insufficient documentation

## 2024-03-17 DIAGNOSIS — Z7901 Long term (current) use of anticoagulants: Secondary | ICD-10-CM | POA: Diagnosis not present

## 2024-03-17 DIAGNOSIS — I2510 Atherosclerotic heart disease of native coronary artery without angina pectoris: Secondary | ICD-10-CM

## 2024-03-17 DIAGNOSIS — I480 Paroxysmal atrial fibrillation: Secondary | ICD-10-CM | POA: Insufficient documentation

## 2024-03-17 DIAGNOSIS — R590 Localized enlarged lymph nodes: Secondary | ICD-10-CM

## 2024-03-17 DIAGNOSIS — R918 Other nonspecific abnormal finding of lung field: Secondary | ICD-10-CM | POA: Diagnosis not present

## 2024-03-17 DIAGNOSIS — M1990 Unspecified osteoarthritis, unspecified site: Secondary | ICD-10-CM | POA: Diagnosis not present

## 2024-03-17 DIAGNOSIS — Z87891 Personal history of nicotine dependence: Secondary | ICD-10-CM | POA: Insufficient documentation

## 2024-03-17 DIAGNOSIS — Z923 Personal history of irradiation: Secondary | ICD-10-CM | POA: Diagnosis not present

## 2024-03-17 DIAGNOSIS — C3411 Malignant neoplasm of upper lobe, right bronchus or lung: Secondary | ICD-10-CM | POA: Diagnosis not present

## 2024-03-17 DIAGNOSIS — I700 Atherosclerosis of aorta: Secondary | ICD-10-CM | POA: Diagnosis not present

## 2024-03-17 DIAGNOSIS — I255 Ischemic cardiomyopathy: Secondary | ICD-10-CM | POA: Insufficient documentation

## 2024-03-17 DIAGNOSIS — C771 Secondary and unspecified malignant neoplasm of intrathoracic lymph nodes: Secondary | ICD-10-CM | POA: Diagnosis not present

## 2024-03-17 DIAGNOSIS — I4891 Unspecified atrial fibrillation: Secondary | ICD-10-CM | POA: Diagnosis not present

## 2024-03-17 DIAGNOSIS — E785 Hyperlipidemia, unspecified: Secondary | ICD-10-CM | POA: Diagnosis not present

## 2024-03-17 DIAGNOSIS — I5022 Chronic systolic (congestive) heart failure: Secondary | ICD-10-CM | POA: Diagnosis not present

## 2024-03-17 DIAGNOSIS — C3491 Malignant neoplasm of unspecified part of right bronchus or lung: Secondary | ICD-10-CM | POA: Diagnosis not present

## 2024-03-17 DIAGNOSIS — Z79899 Other long term (current) drug therapy: Secondary | ICD-10-CM | POA: Diagnosis not present

## 2024-03-17 DIAGNOSIS — I472 Ventricular tachycardia, unspecified: Secondary | ICD-10-CM | POA: Insufficient documentation

## 2024-03-17 DIAGNOSIS — I110 Hypertensive heart disease with heart failure: Secondary | ICD-10-CM | POA: Insufficient documentation

## 2024-03-17 DIAGNOSIS — Z85118 Personal history of other malignant neoplasm of bronchus and lung: Secondary | ICD-10-CM | POA: Diagnosis not present

## 2024-03-17 DIAGNOSIS — F419 Anxiety disorder, unspecified: Secondary | ICD-10-CM | POA: Diagnosis not present

## 2024-03-17 DIAGNOSIS — I252 Old myocardial infarction: Secondary | ICD-10-CM | POA: Diagnosis not present

## 2024-03-17 DIAGNOSIS — R519 Headache, unspecified: Secondary | ICD-10-CM | POA: Diagnosis not present

## 2024-03-17 DIAGNOSIS — F32A Depression, unspecified: Secondary | ICD-10-CM | POA: Diagnosis not present

## 2024-03-17 DIAGNOSIS — Z48813 Encounter for surgical aftercare following surgery on the respiratory system: Secondary | ICD-10-CM | POA: Diagnosis not present

## 2024-03-17 DIAGNOSIS — C499 Malignant neoplasm of connective and soft tissue, unspecified: Secondary | ICD-10-CM | POA: Diagnosis not present

## 2024-03-17 DIAGNOSIS — Z95810 Presence of automatic (implantable) cardiac defibrillator: Secondary | ICD-10-CM | POA: Diagnosis not present

## 2024-03-17 HISTORY — PX: ENDOBRONCHIAL ULTRASOUND: SHX5096

## 2024-03-17 HISTORY — PX: CRYOTHERAPY: SHX6894

## 2024-03-17 HISTORY — PX: BRONCHIAL NEEDLE ASPIRATION BIOPSY: SHX5106

## 2024-03-17 HISTORY — PX: BRONCHIAL BRUSHINGS: SHX5108

## 2024-03-17 HISTORY — PX: VIDEO BRONCHOSCOPY WITH ENDOBRONCHIAL NAVIGATION: SHX6175

## 2024-03-17 MED ORDER — ONDANSETRON HCL 4 MG/2ML IJ SOLN: 4.0000 mg | Freq: Once | INTRAMUSCULAR | Status: DC | PRN

## 2024-03-17 MED ORDER — ACETAMINOPHEN 10 MG/ML IV SOLN: 1000.0000 mg | Freq: Once | INTRAVENOUS | Status: DC | PRN

## 2024-03-17 MED ORDER — SUGAMMADEX SODIUM 200 MG/2ML IV SOLN
INTRAVENOUS | Status: DC | PRN
  Administered 2024-03-17: 200 mg via INTRAVENOUS

## 2024-03-17 MED ORDER — FENTANYL CITRATE (PF) 250 MCG/5ML IJ SOLN
INTRAMUSCULAR | Status: DC | PRN
  Administered 2024-03-17: 100 ug via INTRAVENOUS

## 2024-03-17 MED ORDER — PHENYLEPHRINE HCL-NACL 20-0.9 MG/250ML-% IV SOLN
INTRAVENOUS | Status: DC | PRN
  Administered 2024-03-17: 25 ug/min via INTRAVENOUS

## 2024-03-17 MED ORDER — DEXAMETHASONE SODIUM PHOSPHATE 10 MG/ML IJ SOLN
INTRAMUSCULAR | Status: DC | PRN
  Administered 2024-03-17: 8 mg via INTRAVENOUS

## 2024-03-17 MED ORDER — ROCURONIUM BROMIDE 10 MG/ML (PF) SYRINGE
PREFILLED_SYRINGE | INTRAVENOUS | Status: DC | PRN
  Administered 2024-03-17: 10 mg via INTRAVENOUS
  Administered 2024-03-17: 50 mg via INTRAVENOUS

## 2024-03-17 MED ORDER — LIDOCAINE 2% (20 MG/ML) 5 ML SYRINGE
INTRAMUSCULAR | Status: DC | PRN
  Administered 2024-03-17: 100 mg via INTRAVENOUS

## 2024-03-17 MED ORDER — OXYCODONE HCL 5 MG PO TABS: 5.0000 mg | ORAL_TABLET | Freq: Once | ORAL | Status: DC | PRN

## 2024-03-17 MED ORDER — ONDANSETRON HCL 4 MG/2ML IJ SOLN
INTRAMUSCULAR | Status: DC | PRN
  Administered 2024-03-17: 4 mg via INTRAVENOUS

## 2024-03-17 MED ORDER — PROPOFOL 10 MG/ML IV BOLUS
INTRAVENOUS | Status: DC | PRN
  Administered 2024-03-17: 110 mg via INTRAVENOUS

## 2024-03-17 MED ORDER — FENTANYL CITRATE (PF) 100 MCG/2ML IJ SOLN: 25.0000 ug | INTRAMUSCULAR | Status: DC | PRN

## 2024-03-17 MED ORDER — LACTATED RINGERS IV SOLN: INTRAVENOUS | Status: DC

## 2024-03-17 MED ORDER — PHENYLEPHRINE 80 MCG/ML (10ML) SYRINGE FOR IV PUSH (FOR BLOOD PRESSURE SUPPORT)
PREFILLED_SYRINGE | INTRAVENOUS | Status: DC | PRN
  Administered 2024-03-17 (×3): 80 ug via INTRAVENOUS
  Administered 2024-03-17: 160 ug via INTRAVENOUS
  Administered 2024-03-17 (×2): 80 ug via INTRAVENOUS

## 2024-03-17 MED ORDER — OXYCODONE HCL 5 MG/5ML PO SOLN: 5.0000 mg | Freq: Once | ORAL | Status: DC | PRN

## 2024-03-17 MED ORDER — CHLORHEXIDINE GLUCONATE 0.12 % MT SOLN
15.0000 mL | Freq: Once | OROMUCOSAL | Status: AC
  Administered 2024-03-17: 15 mL via OROMUCOSAL
  Filled 2024-03-17: qty 15

## 2024-03-17 MED FILL — Midazolam HCl Inj 2 MG/2ML (Base Equivalent): INTRAMUSCULAR | Qty: 3 | Status: AC

## 2024-03-17 NOTE — Op Note (Signed)
 Video Bronchoscopy with Endobronchial Ultrasound and Electromagnetic Navigation Procedure Note  Date of Operation: 03/17/2024  Pre-op Diagnosis: Bilateral pulmonary nodules, mediastinal adenopathy  Post-op Diagnosis: Same  Surgeon: LAMAR CHRIS  Assistants: None  Anesthesia: General endotracheal anesthesia  Operation: Flexible video fiberoptic bronchoscopy with endobronchial ultrasound, robotic assisted navigation and biopsies.  Estimated Blood Loss: Minimal  Complications: None apparent  Indications and History: Rachel Bates is a 78 y.o. female with history of non-small cell lung cancer treated by SBRT.  She has adjacent right upper lobe pulmonary nodules and a left upper lobe pulmonary nodule as well as mediastinal adenopathy on her surveillance imaging.  Recommendation was made to achieve a tissue diagnosis using endobronchial ultrasound and robotic assisted navigational bronchoscopy. The risks, benefits, complications, treatment options and expected outcomes were discussed with the patient.  The possibilities of pneumothorax, pneumonia, reaction to medication, pulmonary aspiration, perforation of a viscus, bleeding, failure to diagnose a condition and creating a complication requiring transfusion or operation were discussed with the patient who freely signed the consent.    Description of Procedure: The patient was seen in the Preoperative Area, was examined and was deemed appropriate to proceed.  The patient was taken to [facility] Endoscopy room 3, identified as Rachel Bates and the procedure verified as Flexible Video Fiberoptic Bronchoscopy with robotic assisted navigation and endobronchial ultrasound.  A Time Out was held and the above information confirmed.   Robotic assisted navigation: Prior to the date of the procedure a high-resolution CT scan of the chest was performed. Utilizing ION software program a virtual tracheobronchial tree was generated to allow the creation of  distinct navigation pathways to the patient's parenchymal abnormalities. After being taken to the operating room general anesthesia was initiated and the patient  was orally intubated. The video fiberoptic bronchoscope was introduced via the endotracheal tube and a general inspection was performed which showed normal right and left lung anatomy. Aspiration of the bilateral mainstems was completed to remove any remaining secretions. Robotic catheter inserted into patient's endotracheal tube.   Target #1: Attempt was made to navigate to the largest right upper lobe nodule at the right apex.  Unfortunately the navigation pathway could not be followed because the airways were narrowed/scarred.  Biopsies on the largest right upper lobe nodule were not performed.  Target #2 right upper lobe pulmonary nodule (smaller): The distinct navigation pathways prepared prior to this procedure were then utilized to navigate to patient's lesion identified on CT scan. The robotic catheter was secured into place and the vision probe was withdrawn.  Lesion location was approximated using fluoroscopy.  Local registration and targeting was performed using [employer] Cios mobile C-arm three-dimensional imaging. Under fluoroscopic guidance transbronchial brushings, transbronchial needle biopsies, and transbronchial cryoprobe biopsies were performed to be sent for cytology and pathology.    Target #3 left upper lobe nodule: The distinct navigation pathways prepared prior to this procedure were then utilized to navigate to patient's lesion identified on CT scan. The robotic catheter was secured into place and the vision probe was withdrawn.  Lesion location was approximated using fluoroscopy.  Local registration and targeting was performed using [employer] Cios mobile C-arm three-dimensional imaging. Under fluoroscopic guidance transbronchial brushings, transbronchial needle biopsies, and transbronchial cryoprobe  biopsies were performed to be sent for cytology and pathology.  Needle-in-lesion was confirmed using Cios mobile C-arm.  A bronchioalveolar lavage was performed in the left upper lobe and sent for microbiology.    Endobronchial ultrasound: The robotic scope was  then withdrawn and the endobronchial ultrasound was used to identify and characterize the peritracheal, hilar and bronchial lymph nodes. Inspection showed enlargement at station 4R and station 7. Using real-time ultrasound guidance Wang needle biopsies were take from Station 4R and 7 nodes and were sent for cytology.   At the end of the procedure a general airway inspection was performed and there was no evidence of active bleeding. The bronchoscope was removed.  The patient tolerated the procedure well. There was no significant blood loss and there were no obvious complications. A post-procedural chest x-ray is pending.  Samples Target #2: 1. Transbronchial brushings from right upper lobe nodule 2. Transbronchial Wang needle biopsies from right upper lobe nodule 3. Transbronchial cryoprobe biopsies from right upper lobe nodule  Samples Target #3: 1. Transbronchial Wang needle biopsies from left upper lobe nodule 2. Transbronchial cryoprobe biopsies from left upper lobe nodule 3. Bronchoalveolar lavage from left upper lobe   EBUS Samples: 1. Wang needle biopsies from 4R node 2. Wang needle biopsies from 7 node   Lamar Chris, MD, PhD 03/17/2024, 1:31 PM [facility]

## 2024-03-17 NOTE — Interval H&P Note (Signed)
 History and Physical Interval Note:  03/17/2024 11:23 AM  Rachel Bates  has presented today for surgery, with the diagnosis of bilateral pulm nodules, mediastinal adenopathy.  The various methods of treatment have been discussed with the patient and family. After consideration of risks, benefits and other options for treatment, the patient has consented to  Procedure(s) with comments: VIDEO BRONCHOSCOPY WITH ENDOBRONCHIAL NAVIGATION (Bilateral) - need EBUS scope also ENDOBRONCHIAL ULTRASOUND (EBUS) (Bilateral) as a surgical intervention.  The patient's history has been reviewed, patient examined, no change in status, stable for surgery.  I have reviewed the patient's chart and labs.  Questions were answered to the patient's satisfaction.     Lamar GORMAN Chris

## 2024-03-17 NOTE — Anesthesia Procedure Notes (Addendum)
 Procedure Name: Intubation Date/Time: 03/17/2024 11:42 AM  Performed by: Loreli Blima LABOR, CRNAPre-anesthesia Checklist: Patient identified, Emergency Drugs available, Suction available and Patient being monitored Patient Re-evaluated:Patient Re-evaluated prior to induction Oxygen Delivery Method: Circle System Utilized Preoxygenation: Pre-oxygenation with 100% oxygen Induction Type: IV induction Ventilation: Mask ventilation without difficulty Laryngoscope Size: Mac and 4 Grade View: Grade I Tube type: Oral Tube size: 8.5 mm Number of attempts: 1 Airway Equipment and Method: Stylet Placement Confirmation: ETT inserted through vocal cords under direct vision, positive ETCO2 and breath sounds checked- equal and bilateral Secured at: 21 cm Tube secured with: Tape Dental Injury: Teeth and Oropharynx as per pre-operative assessment  Comments: Edentulous, Grade I view. Atraumatic intubation.

## 2024-03-17 NOTE — Op Note (Signed)
 Procedure Note  Patient: Rachel Bates  [employer] Cios mobile C-arm was utilized to identify and biopsy right upper lobe and left upper lobe nodules.  Needle-in-lesion was confirmed for the left upper lobe nodule using real-time Cios imaging, and images were uploaded to PACS.    Right upper lobe nodule (target #2)    Left upper lobe nodule (target #3)  Lamar Chris, MD, PhD 03/17/2024, 1:44 PM  Pulmonary and Critical Care [phone] or if no answer before 7:00PM call [phone] For any issues after 7:00PM please call eLink [phone]

## 2024-03-17 NOTE — Transfer of Care (Signed)
 Immediate Anesthesia Transfer of Care Note  Patient: Rachel Bates  Procedure(s) Performed: VIDEO BRONCHOSCOPY WITH ENDOBRONCHIAL NAVIGATION (Bilateral) ENDOBRONCHIAL ULTRASOUND (EBUS) (Bilateral) BRONCHOSCOPY, WITH NEEDLE ASPIRATION BIOPSY BRONCHOSCOPY, WITH BRUSH BIOPSY CRYOTHERAPY  Patient Location: PACU  Anesthesia Type:General  Level of Consciousness: awake and alert   Airway & Oxygen Therapy: Patient Spontanous Breathing  Post-op Assessment: Report given to RN and Post -op Vital signs reviewed and stable  Post vital signs: Reviewed and stable  Last Vitals:  Vitals Value Taken Time  BP 140/65 03/17/24 13:30  Temp    Pulse 83 03/17/24 13:32  Resp 18 03/17/24 13:32  SpO2 93 % 03/17/24 13:32  Vitals shown include unfiled device data.  Last Pain:  Vitals:   03/17/24 1052  TempSrc:   PainSc: 0-No pain         Complications: No notable events documented.

## 2024-03-17 NOTE — Discharge Instructions (Addendum)
 Flexible Bronchoscopy, Care After This sheet gives you information about how to care for yourself after your test. Your doctor may also give you more specific instructions. If you have problems or questions, contact your doctor. Follow these instructions at home: Eating and drinking When you are wide awake, your numbness is gone and your cough and gag reflexes have come back, you may: Start eating only soft foods. Slowly drink liquids. Six hours after the test, go back to your normal diet. Driving Do not drive for 24 hours if you were given a medicine to help you relax (sedative). Do not drive or use heavy machinery while taking prescription pain medicine. General instructions Take over-the-counter and prescription medicines only as told by your doctor. Return to your normal activities as told. Ask what activities are safe for you. Do not use any products that have nicotine or tobacco in them. This includes cigarettes and e-cigarettes. If you need help quitting, ask your doctor. Keep all follow-up visits as told by your doctor. This is important. It is very important if you had a tissue sample (biopsy) taken. Get help right away if: You have shortness of breath that gets worse. You get light-headed. You feel like you are going to pass out (faint). You have chest pain. You cough up: More than a little blood. More blood than before. Summary Do not use cigarettes. Do not use e-cigarettes. Seek care in the Emergency Department right away if you have chest pain or shortness of breath. Call or MyChart Message our office for any questions or problems at [phone].  Okay to restart Eliquis  on 03/18/2024.   This information is not intended to replace advice given to you by your health care provider. Make sure you discuss any questions you have with your health care provider.

## 2024-03-18 ENCOUNTER — Encounter (HOSPITAL_COMMUNITY): Payer: Self-pay | Admitting: Emergency Medicine

## 2024-03-18 NOTE — Anesthesia Postprocedure Evaluation (Signed)
 Anesthesia Post Note  Patient: SALIHA SALTS  Procedure(s) Performed: VIDEO BRONCHOSCOPY WITH ENDOBRONCHIAL NAVIGATION (Bilateral) ENDOBRONCHIAL ULTRASOUND (EBUS) (Bilateral) BRONCHOSCOPY, WITH NEEDLE ASPIRATION BIOPSY BRONCHOSCOPY, WITH BRUSH BIOPSY CRYOTHERAPY     Patient location during evaluation: PACU Anesthesia Type: General Level of consciousness: awake and alert Pain management: pain level controlled Vital Signs Assessment: post-procedure vital signs reviewed and stable Respiratory status: spontaneous breathing, nonlabored ventilation, respiratory function stable and patient connected to nasal cannula oxygen Cardiovascular status: blood pressure returned to baseline and stable Postop Assessment: no apparent nausea or vomiting Anesthetic complications: no   No notable events documented.  Last Vitals:  Vitals:   03/17/24 1345 03/17/24 1400  BP: 110/60 (!) 119/58  Pulse: 83 83  Resp: 20 14  Temp:  37.1 C  SpO2: 93% 93%    Last Pain:  Vitals:   03/17/24 1330  TempSrc:   PainSc: 0-No pain                 Lynwood MARLA Cornea

## 2024-03-19 ENCOUNTER — Encounter: Payer: Self-pay | Admitting: Hematology

## 2024-03-19 LAB — CYTOLOGY - NON PAP

## 2024-03-19 LAB — CULTURE, BAL-QUANTITATIVE W GRAM STAIN
Culture: NO GROWTH
Gram Stain: NONE SEEN

## 2024-03-19 LAB — ACID FAST SMEAR (AFB, MYCOBACTERIA): Acid Fast Smear: NEGATIVE

## 2024-03-19 NOTE — Progress Notes (Signed)
 HEMATOLOGY/ONCOLOGY CLINIC NOTE  Date of Service:.03/12/2024  Patient Care Team: O'Buch, Glenis RIGGERS as PCP - General (Internal Medicine) Waddell Danelle ORN, MD as PCP - Cardiology (Cardiology) Harrietta Avelina LABOR, MD (Family Medicine) Onesimo Emaline Brink, MD as Consulting Physician (Hematology)  CHIEF COMPLAINTS/PURPOSE OF CONSULTATION:  Follow-up for continued management of non-small cell lung cancer and history of DLBCL  HISTORY OF PRESENTING ILLNESS:  See previous notes for details on initial presentation  INTERVAL HISTORY:  Rachel Bates is a 78 y.o. 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 She did see Dr. Shelah to evaluate her upper mediastinal lymph nodes and has been scheduled for bronchoscopy with biopsies for mediastinal lymph nodes and possibly lung nodules. She notes no new symptoms.  No new fevers chills night sweats.  No new cough.  No hemoptysis.  No new shortness of breath or chest pain. No new rashes or diarrhea. Recovering from carpal tunnel surgery on her right wrist.  MEDICAL HISTORY:  Past Medical History:  Diagnosis Date   AICD (automatic cardioverter/defibrillator) present dual   Medtronic - original placed 2007, generator change 2014, generator extraction 03/14/2024   Anemia    Anticoagulant long-term use    eliquis    Anxiety    Arthralgia of multiple joints    Arthritis pain    Benign hypertensive heart disease    CAD (coronary artery disease) primary cardiologist-- dr danelle taylor   MI and 2 stents 1995 in [place]    Cancer [facility])    Cataract ?   Surgery 2020   CHF NYHA class II, chronic, systolic (HCC)    followed by dr danelle taylor   Complication of anesthesia    Degenerative disc disease, lumbar    Depression    Difficult intubation    Dyslipidemia    History of basal cell carcinoma (BCC) excision    right ankle area s/p  excision in office 06/ 2019  in  office   History of DVT of lower extremity yrs ago before 2012   History of MI (myocardial infarction) 1995  in [place]   History of pulmonary embolus (PE) 2012   History of ventricular tachycardia    Hyperlipidemia    Hypersomnia    Hypertension    Ischemic cardiomyopathy    Migraines    Myocardial infarction (HCC)    OA (osteoarthritis)    all over   PAF (paroxysmal atrial fibrillation) (HCC)    Pneumonia    02/2023   Primary localized osteoarthritis of right hip 12/18/2017   Primary localized osteoarthritis of right knee 05/28/2018   S/P coronary artery stent placement 1995   in [facility]   05-21-2018 per pt x2  stents in same coronary artery (unsure BM or DES)   Vitamin D  deficiency disease     SURGICAL HISTORY: Past Surgical History:  Procedure Laterality Date   BIOPSY  06/19/2022   Procedure: BIOPSY;  Surgeon: Elicia Claw, MD;  Location: [facility] ENDOSCOPY;  Service: Gastroenterology;;   BRONCHIAL BIOPSY  08/07/2023   Procedure: BRONCHIAL BIOPSIES;  Surgeon: Shelah Lamar RAMAN, MD;  Location: [facility] ENDOSCOPY;  Service: Pulmonary;;   BRONCHIAL BRUSHINGS  08/07/2023   Procedure: BRONCHIAL BRUSHINGS;  Surgeon: Shelah Lamar RAMAN, MD;  Location: [facility] ENDOSCOPY;  Service: Pulmonary;;   BRONCHIAL BRUSHINGS  03/17/2024   Procedure: BRONCHOSCOPY, WITH BRUSH BIOPSY;  Surgeon: Shelah Lamar RAMAN, MD;  Location: [facility] ENDOSCOPY;  Service: Pulmonary;;  BRONCHIAL NEEDLE ASPIRATION BIOPSY  08/07/2023   Procedure: BRONCHIAL NEEDLE ASPIRATION BIOPSIES;  Surgeon: Shelah Lamar RAMAN, MD;  Location: [facility] ENDOSCOPY;  Service: Pulmonary;;   BRONCHIAL NEEDLE ASPIRATION BIOPSY  03/17/2024   Procedure: BRONCHOSCOPY, WITH NEEDLE ASPIRATION BIOPSY;  Surgeon: Shelah Lamar RAMAN, MD;  Location: [facility] ENDOSCOPY;  Service: Pulmonary;;   CARDIAC DEFIBRILLATOR PLACEMENT  2007   CARPAL TUNNEL RELEASE Right 02/26/2024   Procedure: CARPAL TUNNEL RELEASE;  Surgeon: Josefina Chew, MD;  Location: [facility] OR;  Service: Orthopedics;  Laterality: Right;    CATARACT EXTRACTION, BILATERAL     COLONOSCOPY  04/14/2013   colonic polyp, status post polypectomy. Mild panocolonic diverticulosis. Small internal hemorrhoids   CRYOTHERAPY  03/17/2024   Procedure: CRYOTHERAPY;  Surgeon: Shelah Lamar RAMAN, MD;  Location: [facility] ENDOSCOPY;  Service: Pulmonary;;   DILATION AND CURETTAGE OF UTERUS  yrs ago   DIRECT LARYNGOSCOPY Right 10/04/2022   Procedure: DIRECT LARYNGOSCOPY;  Surgeon: Llewellyn Gerard LABOR, DO;  Location: [facility] OR;  Service: ENT;  Laterality: Right;   ENDOBRONCHIAL ULTRASOUND Bilateral 03/17/2024   Procedure: ENDOBRONCHIAL ULTRASOUND (EBUS);  Surgeon: Shelah Lamar RAMAN, MD;  Location[facility] ENDOSCOPY;  Service: Pulmonary;  Laterality: Bilateral;   ESOPHAGOGASTRODUODENOSCOPY (EGD) WITH PROPOFOL  N/A 06/19/2022   Procedure: ESOPHAGOGASTRODUODENOSCOPY (EGD) WITH PROPOFOL ;  Surgeon: Elicia Claw, MD;  Location: [facility] ENDOSCOPY;  Service: Gastroenterology;  Laterality: N/A;   EYE SURGERY  2020   Cataracts removed   FIDUCIAL MARKER PLACEMENT  08/07/2023   Procedure: FIDUCIAL MARKER PLACEMENT;  Surgeon: Shelah Lamar RAMAN, MD;  Location[facility] ENDOSCOPY;  Service: Pulmonary;;   ICD GENERATOR REMOVAL N/A 03/14/2024   Procedure: ICD GENERATOR REMOVAL;  Surgeon: Waddell Danelle ORN, MD;  [facility]: [facility] INVASIVE CV LAB;  Service: Cardiovascular;  Laterality: N/A;   IMPACTION REMOVAL  06/19/2022   Procedure: IMPACTION REMOVAL;  Surgeon: Elicia Claw, MD;  Location: [facility] ENDOSCOPY;  Service: Gastroenterology;;   IMPLANTABLE CARDIOVERTER DEFIBRILLATOR GENERATOR CHANGE N/A 02/04/2013   Procedure: IMPLANTABLE CARDIOVERTER DEFIBRILLATOR GENERATOR CHANGE;  Surgeon: Danelle ORN Waddell, MD;  Location: [facility] CATH LAB;  Service: Cardiovascular;  Laterality: N/A;   IR IMAGING GUIDED PORT INSERTION  10/27/2022   JOINT REPLACEMENT  2019, 2022, 2023   Both hips, both knees   LYMPH NODE BIOPSY Right 10/04/2022   Procedure: EXCISIONAL OF RIGHT DEEP CERVICAL LYMPH NODE;  Surgeon: Llewellyn Gerard LABOR,  DO;  Location: [facility] OR;  Service: ENT;  Laterality: Right;   SHOULDER ARTHROSCOPY Right 2015   TOTAL HIP ARTHROPLASTY Right 12/18/2017   Procedure: RIGHT TOTAL HIP ARTHROPLASTY;  Surgeon: Josefina Chew, MD;  Location: [facility] OR;  Service: Orthopedics;  Laterality: Right;   TOTAL HIP ARTHROPLASTY Left 04/19/2021   Procedure: TOTAL HIP ARTHROPLASTY;  Surgeon: Josefina Chew, MD;  Location: [facility] ORS;  Service: Orthopedics;  Laterality: Left;   TOTAL KNEE ARTHROPLASTY Right 05/28/2018   Procedure: TOTAL KNEE ARTHROPLASTY;  Surgeon: Josefina Chew, MD;  Location: [facility] ORS;  Service: Orthopedics;  Laterality: Right;  Adductor Block   TOTAL KNEE ARTHROPLASTY Left 08/12/2021   Procedure: TOTAL KNEE ARTHROPLASTY;  Surgeon: Josefina Chew, MD;  Location: [facility] ORS;  Service: Orthopedics;  Laterality: Left;   TUBAL LIGATION Bilateral yrs ago   VIDEO BRONCHOSCOPY WITH ENDOBRONCHIAL NAVIGATION Bilateral 03/17/2024   Procedure: VIDEO BRONCHOSCOPY WITH ENDOBRONCHIAL NAVIGATION;  Surgeon: Shelah Lamar RAMAN, MD;  Location: [facility] ENDOSCOPY;  Service: Pulmonary;  Laterality: Bilateral;  need EBUS scope also   VIDEO BRONCHOSCOPY WITH RADIAL ENDOBRONCHIAL ULTRASOUND  08/07/2023   Procedure: VIDEO BRONCHOSCOPY WITH RADIAL ENDOBRONCHIAL ULTRASOUND;  Surgeon: Shelah Lamar RAMAN, MD;  Location: [facility] ENDOSCOPY;  Service: Pulmonary;;   WISDOM TOOTH EXTRACTION      SOCIAL HISTORY: Social History   Socioeconomic History   Marital status: Single    Spouse name: Not on file   Number of children: 0   Years of education: 14   Highest education level: Not on file  Occupational History   Not on file  Tobacco Use   Smoking status: Former    Current packs/day: 0.00    Types: Cigarettes    Start date: 05/21/1966    Quit date: 05/21/1996    Years since quitting: 27.8   Smokeless tobacco: Never  Vaping Use   Vaping status: Never Used  Substance and Sexual Activity   Alcohol use: No   Drug use: Never   Sexual activity: Not on file  Other  Topics Concern   Not on file  Social History Narrative   Not on file   Social Drivers of Health   Financial Resource Strain: Low Risk  (05/28/2018)   Overall Financial Resource Strain (CARDIA)    Difficulty of Paying Living Expenses: Not hard at all  Food Insecurity: No Food Insecurity (08/28/2023)   Hunger Vital Sign    Worried About Running Out of Food in the Last Year: Never true    Ran Out of Food in the Last Year: Never true  Transportation Needs: No Transportation Needs (08/28/2023)   PRAPARE - Administrator, Civil Service (Medical): No    Lack of Transportation (Non-Medical): No  Physical Activity: Not on file  Stress: Not on file  Social Connections: Not on file  Intimate Partner Violence: Not At Risk (08/28/2023)   Humiliation, Afraid, Rape, and Kick questionnaire    Fear of Current or Ex-Partner: No    Emotionally Abused: No    Physically Abused: No    Sexually Abused: No    FAMILY HISTORY: Family History  Problem Relation Age of Onset   Hypertension Mother    Thyroid  disease Mother    Alzheimer's disease Mother    Hearing loss Mother    Coronary artery disease Father    Pulmonary embolism Father    Arthritis Father    Heart disease Father    Congestive Heart Failure Maternal Grandmother    Hypertension Maternal Grandmother    Heart attack Maternal Grandfather    Other Maternal Grandfather        carotid disease   Dementia Paternal Grandmother    Other Paternal Grandfather 67       accident   Cancer Sister     ALLERGIES:  is allergic to sumatriptan succinate.  MEDICATIONS:  Current Outpatient Medications  Medication Sig Dispense Refill   b complex vitamins capsule Take 1 capsule by mouth daily.     Calcium  Carb-Cholecalciferol  (CALCIUM  600 + D PO) Take 1 tablet by mouth daily.     carvedilol  (COREG ) 12.5 MG tablet Take 12.5 mg by mouth 2 (two) times daily with a meal.      Cholecalciferol  (VITAMIN D3) 125 MCG (5000 UT) capsule Take 5,000  Units by mouth daily.     cyanocobalamin  (VITAMIN B12) 1000 MCG tablet Take 1,000 mcg by mouth daily.     ELIQUIS  5 MG TABS tablet TAKE 1 TABLET BY MOUTH TWICE A DAY 60 tablet 5   FIBER PO Take 1 capsule by mouth 2 (two) times daily.     gabapentin  (NEURONTIN ) 300 MG capsule Take 300 mg by mouth 3 (  three) times daily.     loratadine  (CLARITIN ) 10 MG tablet Take 10 mg by mouth daily.     Omega-3 Fatty Acids (FISH OIL) 1000 MG CAPS Take 1,000 mg by mouth daily.     prochlorperazine  (COMPAZINE ) 10 MG tablet Take 1 tablet (10 mg total) by mouth every 6 (six) hours as needed for nausea or vomiting. 30 tablet 1   ramipril  (ALTACE ) 2.5 MG tablet Take 2.5 mg by mouth daily.       simvastatin  (ZOCOR ) 40 MG tablet Take 40 mg by mouth at bedtime.       tiZANidine  (ZANAFLEX ) 4 MG tablet Take 2 mg by mouth every 8 (eight) hours as needed for muscle spasms.     traZODone  (DESYREL ) 50 MG tablet Take 50 mg by mouth at bedtime.      venlafaxine  (EFFEXOR ) 75 MG tablet Take 75 mg by mouth daily.     vitamin E  400 UNIT capsule Take 400 Units by mouth daily.     zonisamide  (ZONEGRAN ) 50 MG capsule Take 50-100 mg by mouth See admin instructions. Take 50mg  by mouth in the morning and 100mg  at night.     No current facility-administered medications for this visit.    REVIEW OF SYSTEMS:   .10 Point review of Systems was done is negative except as noted above.  PHYSICAL EXAMINATION: .BP 133/65 (BP Location: Left Arm, Patient Position: Sitting)   Pulse 85   Temp 98.1 F (36.7 C) (Tympanic)   Resp 18   Ht 5' 7 (1.702 m)   Wt 149 lb 6.4 oz (67.8 kg)   SpO2 96%   BMI 23.40 kg/m  . SABRA GENERAL:alert, in no acute distress and comfortable SKIN: no acute rashes, no significant lesions EYES: conjunctiva are pink and non-injected, sclera anicteric OROPHARYNX: MMM, no exudates, no oropharyngeal erythema or ulceration NECK: supple, no JVD LYMPH:  no palpable lymphadenopathy in the cervical, axillary or inguinal  regions LUNGS: clear to auscultation b/l with normal respiratory effort HEART: regular rate & rhythm ABDOMEN:  normoactive bowel sounds , non tender, not distended. Extremity: no pedal edema PSYCH: alert & oriented x 3 with fluent speech NEURO: no focal motor/sensory deficits     LABORATORY DATA:  I have reviewed the data as listed    Latest Ref Rng & Units 03/12/2024    9:24 AM 02/27/2024    9:05 AM 02/13/2024    8:30 AM  CBC  WBC 4.0 - 10.5 K/uL 4.0  2.5  2.7   Hemoglobin 12.0 - 15.0 g/dL 84.8  86.7  86.7   Hematocrit 36.0 - 46.0 % 43.1  38.5  37.9   Platelets 150 - 400 K/uL 94  86  76       Latest Ref Rng & Units 03/12/2024    9:24 AM 02/27/2024    9:05 AM 02/13/2024    8:30 AM  CMP  Glucose 70 - 99 mg/dL 92  93  98   BUN 8 - 23 mg/dL 14  12  13    Creatinine 0.44 - 1.00 mg/dL 9.37  9.40  9.39   Sodium 135 - 145 mmol/L 140  140  142   Potassium 3.5 - 5.1 mmol/L 4.2  4.1  3.6   Chloride 98 - 111 mmol/L 108  107  110   CO2 22 - 32 mmol/L 26  28  27    Calcium  8.9 - 10.3 mg/dL 9.5  9.1  8.8   Total Protein 6.5 - 8.1 g/dL 7.1  6.3  6.3   Total Bilirubin 0.0 - 1.2 mg/dL 0.6  0.5  0.4   Alkaline Phos 38 - 126 U/L 84  74  82   AST 15 - 41 U/L 19  18  17    ALT 0 - 44 U/L 16  11  12     Lab Results  Component Value Date   LDH 166 12/10/2023    -01/16/2024 [facility] 360:                 Molecular Pathology 10/21/2022:    Surgical Pathology 10/04/22: A. LYMPH NODE, RIGHT LEVEL 2 DEEP CERVICAL, EXCISION:  -Diffuse large B-cell lymphoma  -See comment COMMENT: The sections show diffuse effacement of the lymph nodal architecture primarily by a population of large lymphoid cells characterized by vesicular chromatin and small nucleoli associated with apoptosis and brisk mitosis.  In some areas, the large atypical lymphoid proliferation extends into the perinodal adipose tissue.  In this background, there are scattered variably sized and somewhat disrupted aggregates  of primarily small lymphoid cells characterized by high nuclear cytoplasmic ratio, angulated nuclear contours and small to inconspicuous nucleoli. Flow cytometric analysis was performed [facility] [id number]) and shows a monoclonal, lambda restricted B-cell population expressing CD10.  In addition, immunohistochemical stains for CD3, CD5, CD10, CD20, PAX5, BCL6, Bcl-2, Ki-67, CD30, CD138, CD21, EBV in addition to in situ hybridization for kappa and lambda were performed with appropriate controls.  The large lymphoid cells are positive for CD20, PAX5, CD10, BCL6, Bcl-2, and partially for cytoplasmic lambda.  No significant staining is seen with EBV, CD30, CD138 or cytoplasmic kappa.  Ki67 shows variably increased expression (more than 50% in some areas). CD21 highlights scattered somewhat disrupted follicular dendritic networks. The lymphoid aggregates of primarily small lymphoid cells previously described show positivity for B-cell markers CD20 and PAX5 in addition to CD10 and Bcl-2.  There is an admixed variable T-cell component in the background as seen with CD3 and CD5 and there is no apparent co-expression of CD5 in B-cell areas.  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    RADIOGRAPHIC STUDIES: I have personally reviewed the radiological images as listed and agreed with the findings in the report.   NM PET 09/04/2022: IMPRESSION: 1. There is a large tracer avid mass centered within the left base of tongue and left lingual region which extends into the hypopharyngeal region ventral to the epiglottis. Imaging findings are compatible with a primary head and neck malignancy. 2. A smaller focus of increased uptake localizes to the right pharyngeal tonsil region. Indeterminate. 3. Bilateral tracer avid cervical and supraclavicular lymph nodes compatible  with nodal metastasis. 4. There is a 7 mm tracer avid nodule within the anteromedial left upper lobe which is suspicious for pulmonary metastasis. 5. There is increased uptake identified along the long axis of the posterior right seventh rib. No corresponding lytic or sclerotic changes identified on the CT images. Cannot exclude bone metastases. 6. Multiple prominent retroperitoneal and mesenteric lymph nodes are identified which exhibit mild tracer uptake. This is a nonspecific finding and may reflect reactive adenopathy. Metastatic adenopathy cannot be excluded. Attention on future surveillance imaging is advised. 7.  Aortic Atherosclerosis (ICD10-I70.0).   ASSESSMENT & PLAN:   78 y.o.  female with:   1. 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 2. 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  PLAN: - Discussed lab results from today  CBC normal hemoglobin 15.1 normal WBC count of 4k and improved platelets of 94k CMP unremarkable TSH within normal limits at 2.16 Patient has met with Dr. Cheryal from pulmonology and is scheduled for fiberoptic bronchoscopy with EBUS space biopsy of her mediastinal lymph nodes and possibly new lung nodules. We shall follow-up of the pathology result and determine next steps in management. Patient has no notable toxicity from her durvalumab  at this time and we shall continue this with the same supportive medications.  FOLLOW-UP: Please schedule next 3 doses of durvalumab  as per integrated scheduling with port flush labs and MD visit  The total time spent in the appointment was  20 minutes*.  All of the patient's questions were answered with apparent satisfaction. The patient knows to call the clinic with any problems, questions or concerns.   Emaline Saran MD MS AAHIVMS [facility] Hematology/Oncology Physician [facility]  .*Total Encounter Time as defined by the Centers for Medicare and Medicaid Services includes, in addition to the face-to-face time of a patient visit (documented in the note above) non-face-to-face time: obtaining and reviewing outside history, ordering and reviewing medications, tests or procedures, care coordination (communications with other health care professionals or caregivers) and documentation in the medical record.

## 2024-03-22 LAB — ANAEROBIC CULTURE W GRAM STAIN: Gram Stain: NONE SEEN

## 2024-03-25 ENCOUNTER — Ambulatory Visit (HOSPITAL_COMMUNITY)
Admission: RE | Admit: 2024-03-25 | Discharge: 2024-03-25 | Disposition: A | Discharge status: Home / Self Care | Source: Ambulatory Visit | Attending: Physician Assistant | Admitting: Physician Assistant

## 2024-03-25 ENCOUNTER — Other Ambulatory Visit: Payer: Self-pay

## 2024-03-25 ENCOUNTER — Inpatient Hospital Stay

## 2024-03-25 ENCOUNTER — Inpatient Hospital Stay (HOSPITAL_BASED_OUTPATIENT_CLINIC_OR_DEPARTMENT_OTHER): Admitting: Physician Assistant

## 2024-03-25 VITALS — BP 129/73 | HR 87 | Temp 97.7°F | Resp 18 | Wt 148.2 lb

## 2024-03-25 DIAGNOSIS — C3491 Malignant neoplasm of unspecified part of right bronchus or lung: Secondary | ICD-10-CM

## 2024-03-25 DIAGNOSIS — C3411 Malignant neoplasm of upper lobe, right bronchus or lung: Secondary | ICD-10-CM | POA: Diagnosis not present

## 2024-03-25 DIAGNOSIS — Z7189 Other specified counseling: Secondary | ICD-10-CM

## 2024-03-25 DIAGNOSIS — Z5112 Encounter for antineoplastic immunotherapy: Secondary | ICD-10-CM

## 2024-03-25 DIAGNOSIS — Z79620 Long term (current) use of immunosuppressive biologic: Secondary | ICD-10-CM | POA: Diagnosis not present

## 2024-03-25 DIAGNOSIS — Z87891 Personal history of nicotine dependence: Secondary | ICD-10-CM | POA: Diagnosis not present

## 2024-03-25 DIAGNOSIS — I700 Atherosclerosis of aorta: Secondary | ICD-10-CM | POA: Diagnosis not present

## 2024-03-25 LAB — CBC WITH DIFFERENTIAL (CANCER CENTER ONLY)
Abs Immature Granulocytes: 0.01 K/uL (ref 0.00–0.07)
Basophils Absolute: 0 K/uL (ref 0.0–0.1)
Basophils Relative: 0 %
Eosinophils Absolute: 0.1 K/uL (ref 0.0–0.5)
Eosinophils Relative: 3 %
HCT: 41 % (ref 36.0–46.0)
Hemoglobin: 14.2 g/dL (ref 12.0–15.0)
Immature Granulocytes: 0 %
Lymphocytes Relative: 10 %
Lymphs Abs: 0.5 K/uL — ABNORMAL LOW (ref 0.7–4.0)
MCH: 31.5 pg (ref 26.0–34.0)
MCHC: 34.6 g/dL (ref 30.0–36.0)
MCV: 90.9 fL (ref 80.0–100.0)
Monocytes Absolute: 0.4 K/uL (ref 0.1–1.0)
Monocytes Relative: 9 %
Neutro Abs: 3.9 K/uL (ref 1.7–7.7)
Neutrophils Relative %: 78 %
Platelet Count: 116 K/uL — ABNORMAL LOW (ref 150–400)
RBC: 4.51 MIL/uL (ref 3.87–5.11)
RDW: 14.7 % (ref 11.5–15.5)
WBC Count: 4.9 K/uL (ref 4.0–10.5)
nRBC: 0 % (ref 0.0–0.2)

## 2024-03-25 LAB — CMP (CANCER CENTER ONLY)
ALT: 14 U/L (ref 0–44)
AST: 18 U/L (ref 15–41)
Albumin: 4.2 g/dL (ref 3.5–5.0)
Alkaline Phosphatase: 95 U/L (ref 38–126)
Anion gap: 6 (ref 5–15)
BUN: 16 mg/dL (ref 8–23)
CO2: 25 mmol/L (ref 22–32)
Calcium: 9.2 mg/dL (ref 8.9–10.3)
Chloride: 108 mmol/L (ref 98–111)
Creatinine: 0.7 mg/dL (ref 0.44–1.00)
GFR, Estimated: >60 mL/min (ref >60)
Glucose, Bld: 91 mg/dL (ref 70–99)
Potassium: 4.2 mmol/L (ref 3.5–5.1)
Sodium: 139 mmol/L (ref 135–145)
Total Bilirubin: 0.5 mg/dL (ref 0.0–1.2)
Total Protein: 6.9 g/dL (ref 6.5–8.1)

## 2024-03-25 LAB — TSH: TSH: 1.51 u[IU]/mL (ref 0.350–4.500)

## 2024-03-25 MED ORDER — SODIUM CHLORIDE 0.9 % IV SOLN: INTRAVENOUS | Status: DC

## 2024-03-25 MED ORDER — SODIUM CHLORIDE 0.9 % IV SOLN
10.0000 mg/kg | Freq: Once | INTRAVENOUS | Status: AC
  Administered 2024-03-25: 740 mg via INTRAVENOUS
  Filled 2024-03-25: qty 4.8

## 2024-03-25 NOTE — Patient Instructions (Signed)

## 2024-03-25 NOTE — Progress Notes (Signed)
 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 female who presents today for follow up and management of squamous cell carcinoma of the lung. She was last seen on 03/12/2024 by Dr. Onesimo. In the interim, she continued on Durvalumab  therapy.  Ms. Geddis reports she underwent EBUS with biopsy on 03/17/2024. A couple of days after EBUS, she noticed a rattle in her chest without shortness of breath or fevers. Her energy levels are fairly stable. She has fatigue but can complete her ADLs on her own.  She denies nausea, vomiting or bowel habit changes. She denies easy bruising or signs of bleeding. She denies fevers, chills, sweats, shortness of breath, chest pain or cough. She has no other complaints.   MEDICAL HISTORY:  Past Medical History:  Diagnosis Date   AICD (automatic cardioverter/defibrillator) present dual   Medtronic - original placed 2007, generator change 2014, generator extraction 03/14/2024   Anemia    Anticoagulant long-term use    eliquis    Anxiety    Arthralgia of multiple joints    Arthritis pain    Benign hypertensive heart disease    CAD (coronary artery disease) primary cardiologist-- dr danelle taylor   MI and 2 stents 1995 in [place]    [facility])    Cataract ?   Surgery 2020   CHF NYHA class II, chronic, systolic (HCC)    followed by dr danelle taylor   Complication of anesthesia    Degenerative disc disease, lumbar    Depression    Difficult intubation    Dyslipidemia    History of basal cell carcinoma (BCC) excision    right ankle area s/p  excision in  office 06/ 2019  in office   History of DVT of lower extremity yrs ago before 2012   History of MI (myocardial infarction) 1995  in [place]   History of pulmonary embolus (PE) 2012   History of ventricular tachycardia    Hyperlipidemia    Hypersomnia    Hypertension    Ischemic cardiomyopathy    Migraines    Myocardial infarction (HCC)    OA (osteoarthritis)    all over   PAF (paroxysmal atrial fibrillation) (HCC)    Pneumonia    02/2023   Primary localized osteoarthritis of right hip 12/18/2017   Primary localized osteoarthritis of right knee 05/28/2018   S/P coronary artery stent placement 1995   in [facility]   05-21-2018 per pt x2  stents in same coronary artery (unsure BM or DES)   Vitamin D  deficiency disease     SURGICAL HISTORY: Past Surgical History:  Procedure Laterality Date   BIOPSY  06/19/2022   Procedure: BIOPSY;  Surgeon: Elicia Claw, MD;  Location: [facility] ENDOSCOPY;  Service: Gastroenterology;;   BRONCHIAL BIOPSY  08/07/2023   Procedure: BRONCHIAL BIOPSIES;  Surgeon: Shelah Lamar RAMAN, MD;  Location: [facility] ENDOSCOPY;  Service: Pulmonary;;   BRONCHIAL BRUSHINGS  08/07/2023   Procedure: BRONCHIAL BRUSHINGS;  Surgeon: Shelah Lamar RAMAN, MD;  Location[facility] ENDOSCOPY;  Service: Pulmonary;;   BRONCHIAL BRUSHINGS  03/17/2024   Procedure: BRONCHOSCOPY, WITH BRUSH BIOPSY;  Surgeon: Shelah Lamar RAMAN, MD;  Location: [facility] ENDOSCOPY;  Service: Pulmonary;;   BRONCHIAL NEEDLE ASPIRATION BIOPSY  08/07/2023   Procedure: BRONCHIAL NEEDLE ASPIRATION BIOPSIES;  Surgeon: Shelah Lamar RAMAN, MD;  Location: [facility] ENDOSCOPY;  Service: Pulmonary;;   BRONCHIAL NEEDLE ASPIRATION BIOPSY  03/17/2024   Procedure: BRONCHOSCOPY, WITH NEEDLE ASPIRATION BIOPSY;  Surgeon: Shelah Lamar RAMAN, MD;  Location: [facility] ENDOSCOPY;  Service: Pulmonary;;   CARDIAC DEFIBRILLATOR PLACEMENT  2007   CARPAL TUNNEL RELEASE Right 02/26/2024   Procedure: CARPAL TUNNEL RELEASE;  Surgeon: Josefina Chew, MD;  Location: [facility] OR;  Service: Orthopedics;   Laterality: Right;   CATARACT EXTRACTION, BILATERAL     COLONOSCOPY  04/14/2013   colonic polyp, status post polypectomy. Mild panocolonic diverticulosis. Small internal hemorrhoids   CRYOTHERAPY  03/17/2024   Procedure: CRYOTHERAPY;  Surgeon: Shelah Lamar RAMAN, MD;  Location: [facility] ENDOSCOPY;  Service: Pulmonary;;   DILATION AND CURETTAGE OF UTERUS  yrs ago   DIRECT LARYNGOSCOPY Right 10/04/2022   Procedure: DIRECT LARYNGOSCOPY;  Surgeon: Llewellyn Gerard LABOR, DO;  Location: [facility] OR;  Service: ENT;  Laterality: Right;   ENDOBRONCHIAL ULTRASOUND Bilateral 03/17/2024   Procedure: ENDOBRONCHIAL ULTRASOUND (EBUS);  Surgeon: Shelah Lamar RAMAN, MD;  Location[facility] ENDOSCOPY;  Service: Pulmonary;  Laterality: Bilateral;   ESOPHAGOGASTRODUODENOSCOPY (EGD) WITH PROPOFOL  N/A 06/19/2022   Procedure: ESOPHAGOGASTRODUODENOSCOPY (EGD) WITH PROPOFOL ;  Surgeon: Elicia Claw, MD;  Location: [facility] ENDOSCOPY;  Service: Gastroenterology;  Laterality: N/A;   EYE SURGERY  2020   Cataracts removed   FIDUCIAL MARKER PLACEMENT  08/07/2023   Procedure: FIDUCIAL MARKER PLACEMENT;  Surgeon: Shelah Lamar RAMAN, MD;  Location: [facility] ENDOSCOPY;  Service: Pulmonary;;   ICD GENERATOR REMOVAL N/A 03/14/2024   Procedure: ICD GENERATOR REMOVAL;  Surgeon: Waddell Danelle ORN, MD;  [facility] INVASIVE CV LAB;  Service: Cardiovascular;  Laterality: N/A;   IMPACTION REMOVAL  06/19/2022   Procedure: IMPACTION REMOVAL;  Surgeon: Elicia Claw, MD;  Location: [facility] ENDOSCOPY;  Service: Gastroenterology;;   IMPLANTABLE CARDIOVERTER DEFIBRILLATOR GENERATOR CHANGE N/A 02/04/2013   Procedure: IMPLANTABLE CARDIOVERTER DEFIBRILLATOR GENERATOR CHANGE;  Surgeon: Danelle ORN Waddell, MD;  Location: [facility] CATH LAB;  Service: Cardiovascular;  Laterality: N/A;   IR IMAGING GUIDED PORT INSERTION  10/27/2022   JOINT REPLACEMENT  2019, 2022, 2023   Both hips, both knees   LYMPH NODE BIOPSY Right 10/04/2022   Procedure: EXCISIONAL OF RIGHT DEEP CERVICAL LYMPH NODE;  Surgeon:  Llewellyn Gerard LABOR, DO;  Location: [facility] OR;  Service: ENT;  Laterality: Right;   SHOULDER ARTHROSCOPY Right 2015   TOTAL HIP ARTHROPLASTY Right 12/18/2017   Procedure: RIGHT TOTAL HIP ARTHROPLASTY;  Surgeon: Josefina Chew, MD;  [facility]: [facility] OR;  Service: Orthopedics;  Laterality: Right;   TOTAL HIP ARTHROPLASTY Left 04/19/2021   Procedure: TOTAL HIP ARTHROPLASTY;  Surgeon: Josefina Chew, MD;  Location: [facility] ORS;  Service: Orthopedics;  Laterality: Left;   TOTAL KNEE ARTHROPLASTY Right 05/28/2018   Procedure: TOTAL KNEE ARTHROPLASTY;  Surgeon: Josefina Chew, MD;  Location: [facility] ORS;  Service: Orthopedics;  Laterality: Right;  Adductor Block   TOTAL KNEE ARTHROPLASTY Left 08/12/2021   Procedure: TOTAL KNEE ARTHROPLASTY;  Surgeon: Josefina Chew, MD;  Location: [facility] ORS;  Service: Orthopedics;  Laterality: Left;   TUBAL LIGATION Bilateral yrs ago   VIDEO BRONCHOSCOPY WITH ENDOBRONCHIAL NAVIGATION Bilateral 03/17/2024   Procedure: VIDEO BRONCHOSCOPY WITH ENDOBRONCHIAL NAVIGATION;  Surgeon: Shelah Lamar RAMAN, MD;  Location: [facility] ENDOSCOPY;  Service: Pulmonary;  Laterality: Bilateral;  need EBUS scope also   VIDEO BRONCHOSCOPY WITH RADIAL ENDOBRONCHIAL ULTRASOUND  08/07/2023  Procedure: VIDEO BRONCHOSCOPY WITH RADIAL ENDOBRONCHIAL ULTRASOUND;  Surgeon: Shelah Lamar RAMAN, MD;  [facility]: [facility] ENDOSCOPY;  Service: Pulmonary;;   WISDOM TOOTH EXTRACTION      SOCIAL HISTORY: Social History   Socioeconomic History   Marital status: Single    Spouse name: Not on file   Number of children: 0   Years of education: 14   Highest education level: Not on file  Occupational History   Not on file  Tobacco Use   Smoking status: Former    Current packs/day: 0.00    Types: Cigarettes    Start date: 05/21/1966    Quit date: 05/21/1996    Years since quitting: 27.8   Smokeless tobacco: Never  Vaping Use   Vaping status: Never Used  Substance and Sexual Activity   Alcohol use: No   Drug use: Never   Sexual  activity: Not on file  Other Topics Concern   Not on file  Social History Narrative   Not on file   Social Drivers of Health   Financial Resource Strain: Low Risk  (05/28/2018)   Overall Financial Resource Strain (CARDIA)    Difficulty of Paying Living Expenses: Not hard at all  Food Insecurity: No Food Insecurity (08/28/2023)   Hunger Vital Sign    Worried About Running Out of Food in the Last Year: Never true    Ran Out of Food in the Last Year: Never true  Transportation Needs: No Transportation Needs (08/28/2023)   PRAPARE - Administrator, Civil Service (Medical): No    Lack of Transportation (Non-Medical): No  Physical Activity: Not on file  Stress: Not on file  Social Connections: Not on file  Intimate Partner Violence: Not At Risk (08/28/2023)   Humiliation, Afraid, Rape, and Kick questionnaire    Fear of Current or Ex-Partner: No    Emotionally Abused: No    Physically Abused: No    Sexually Abused: No    FAMILY HISTORY: Family History  Problem Relation Age of Onset   Hypertension Mother    Thyroid  disease Mother    Alzheimer's disease Mother    Hearing loss Mother    Coronary artery disease Father    Pulmonary embolism Father    Arthritis Father    Heart disease Father    Congestive Heart Failure Maternal Grandmother    Hypertension Maternal Grandmother    Heart attack Maternal Grandfather    Other Maternal Grandfather        carotid disease   Dementia Paternal Grandmother    Other Paternal Grandfather 60       accident   Cancer Sister     ALLERGIES:  is allergic to sumatriptan succinate.  MEDICATIONS:  Current Outpatient Medications  Medication Sig Dispense Refill   b complex vitamins capsule Take 1 capsule by mouth daily.     Calcium  Carb-Cholecalciferol  (CALCIUM  600 + D PO) Take 1 tablet by mouth daily.     carvedilol  (COREG ) 12.5 MG tablet Take 12.5 mg by mouth 2 (two) times daily with a meal.      Cholecalciferol  (VITAMIN D3) 125 MCG  (5000 UT) capsule Take 5,000 Units by mouth daily.     cyanocobalamin  (VITAMIN B12) 1000 MCG tablet Take 1,000 mcg by mouth daily.     ELIQUIS  5 MG TABS tablet TAKE 1 TABLET BY MOUTH TWICE A DAY 60 tablet 5   FIBER PO Take 1 capsule by mouth 2 (two) times daily.     gabapentin  (NEURONTIN ) 300  MG capsule Take 300 mg by mouth 3 (three) times daily.     loratadine  (CLARITIN ) 10 MG tablet Take 10 mg by mouth daily.     Omega-3 Fatty Acids (FISH OIL) 1000 MG CAPS Take 1,000 mg by mouth daily.     prochlorperazine  (COMPAZINE ) 10 MG tablet Take 1 tablet (10 mg total) by mouth every 6 (six) hours as needed for nausea or vomiting. 30 tablet 1   ramipril  (ALTACE ) 2.5 MG tablet Take 2.5 mg by mouth daily.       simvastatin  (ZOCOR ) 40 MG tablet Take 40 mg by mouth at bedtime.       tiZANidine  (ZANAFLEX ) 4 MG tablet Take 2 mg by mouth every 8 (eight) hours as needed for muscle spasms.     traZODone  (DESYREL ) 50 MG tablet Take 50 mg by mouth at bedtime.      venlafaxine  (EFFEXOR ) 75 MG tablet Take 75 mg by mouth daily.     vitamin E  400 UNIT capsule Take 400 Units by mouth daily.     zonisamide  (ZONEGRAN ) 50 MG capsule Take 50-100 mg by mouth See admin instructions. Take 50mg  by mouth in the morning and 100mg  at night.     No current facility-administered medications for this visit.   Facility-Administered Medications Ordered in Other Visits  Medication Dose Route Frequency Provider Last Rate Last Admin   0.9 %  sodium chloride  infusion   Intravenous Continuous Kale, Gautam Kishore, MD 10 mL/hr at 03/25/24 1050 New Bag at 03/25/24 1050    REVIEW OF SYSTEMS:    10 Point review of Systems was done is negative except as noted above.   PHYSICAL EXAMINATION: .BP 129/73   Pulse 87   Temp 97.7 F (36.5 C)   Resp 18   Wt 148 lb 3.2 oz (67.2 kg)   SpO2 95%   BMI 23.21 kg/m    GENERAL:alert, in no acute distress and comfortable SKIN: no acute rashes, no significant lesions EYES: conjunctiva are pink  and non-injected, sclera anicteric LUNGS: normal respiratory effort, diffuse crackling heard on auscultation.  HEART: regular rate & rhythm Extremity: no pedal edema PSYCH: alert & oriented x 3 with fluent speech NEURO: no focal motor/sensory deficits   LABORATORY DATA:  I have reviewed the data as listed    Latest Ref Rng & Units 03/25/2024    9:45 AM 03/12/2024    9:24 AM 02/27/2024    9:05 AM  CBC  WBC 4.0 - 10.5 K/uL 4.9  4.0  2.5   Hemoglobin 12.0 - 15.0 g/dL 85.7  84.8  86.7   Hematocrit 36.0 - 46.0 % 41.0  43.1  38.5   Platelets 150 - 400 K/uL 116  94  86       Latest Ref Rng & Units 03/25/2024    9:45 AM 03/12/2024    9:24 AM 02/27/2024    9:05 AM  CMP  Glucose 70 - 99 mg/dL 91  92  93   BUN 8 - 23 mg/dL 16  14  12    Creatinine 0.44 - 1.00 mg/dL 9.29  9.37  9.40   Sodium 135 - 145 mmol/L 139  140  140   Potassium 3.5 - 5.1 mmol/L 4.2  4.2  4.1   Chloride 98 - 111 mmol/L 108  108  107   CO2 22 - 32 mmol/L 25  26  28    Calcium  8.9 - 10.3 mg/dL 9.2  9.5  9.1   Total Protein 6.5 - 8.1  g/dL 6.9  7.1  6.3   Total Bilirubin 0.0 - 1.2 mg/dL 0.5  0.6  0.5   Alkaline Phos 38 - 126 U/L 95  84  74   AST 15 - 41 U/L 18  19  18    ALT 0 - 44 U/L 14  16  11     Lab Results  Component Value Date   LDH 166 12/10/2023   Molecular Pathology 10/21/2022:    Surgical Pathology 10/04/22: A. LYMPH NODE, RIGHT LEVEL 2 DEEP CERVICAL, EXCISION:  -Diffuse large B-cell lymphoma  -See comment COMMENT: The sections show diffuse effacement of the lymph nodal architecture primarily by a population of large lymphoid cells characterized by vesicular chromatin and small nucleoli associated with apoptosis and brisk mitosis.  In some areas, the large atypical lymphoid proliferation extends into the perinodal adipose tissue.  In this background, there are scattered variably sized and somewhat disrupted aggregates of primarily small lymphoid cells characterized by high nuclear cytoplasmic ratio,  angulated nuclear contours and small to inconspicuous nucleoli. Flow cytometric analysis was performed [facility] [id number]) and shows a monoclonal, lambda restricted B-cell population expressing CD10.  In addition, immunohistochemical stains for CD3, CD5, CD10, CD20, PAX5, BCL6, Bcl-2, Ki-67, CD30, CD138, CD21, EBV in addition to in situ hybridization for kappa and lambda were performed with appropriate controls.  The large lymphoid cells are positive for CD20, PAX5, CD10, BCL6, Bcl-2, and partially for cytoplasmic lambda.  No significant staining is seen with EBV, CD30, CD138 or cytoplasmic kappa.  Ki67 shows variably increased expression (more than 50% in some areas). CD21 highlights scattered somewhat disrupted follicular dendritic networks. The lymphoid aggregates of primarily small lymphoid cells previously described show positivity for B-cell markers CD20 and PAX5 in addition to CD10 and Bcl-2.  There is an admixed variable T-cell component in the background as seen with CD3 and CD5 and there is no apparent co-expression of CD5 in B-cell areas.  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    RADIOGRAPHIC STUDIES: I have personally reviewed the radiological images as listed and agreed with the findings in the report. DG Chest Port 1 View Result Date: 03/17/2024 CLINICAL DATA:  Status post bronchoscopy with biopsy. EXAM: PORTABLE CHEST 1 VIEW COMPARISON:  08/07/2023 and CT chest 02/13/2024. FINDINGS: Right IJ Port-A-Cath tip is in the SVC. Pacemaker and ICD lead tips are in the right atrium and right ventricle, respectively. Heart size normal. Thoracic aorta is calcified. Patchy airspace opacification in the right suprahilar region, with a pre-existing fiducial marker. Lungs are otherwise clear. No definite pneumothorax. IMPRESSION: 1. No definite pneumothorax  status post bronchoscopy and biopsy. 2. Similar patchy airspace opacification in the right upper lobe, possibly radiation related. Electronically Signed   By: Newell Eke M.D.   On: 03/17/2024 14:17   DG C-ARM BRONCHOSCOPY Result Date: 03/17/2024 C-ARM BRONCHOSCOPY: Fluoroscopy was utilized by the requesting physician.  No radiographic interpretation.   DG C-Arm 1-60 Min-No Report Result Date: 03/17/2024 Fluoroscopy was utilized by the requesting physician.  No radiographic interpretation.   EP PPM/ICD IMPLANT Result Date: 03/14/2024 Conclusion: Successful removal of her previously implanted dual-chamber ICD with relocation of the atrial and ICD leads to a subpectoral location. Dr. Adina Primus, MD was the co-surgeon during the procedure. Danelle Birmingham, MD    ASSESSMENT & PLAN:  Rachel Bates is 78 y.o.  female who presents for a  follow up for continued management of squamous cell carcinoma of the lung.   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 Completed radiation therapy on 11/02/2023.  --Started Cycle 1, Day 1 of Durvalumab  on 09/18/2023.   --CT Head from 12/31/2023 showed no intracranial metastatic disease --PET scan from 12/31/2023 showed previous RUL perihilar mass and previously seen nodes in the right hilum in the mid mediastinum appears smaller and shows less uptake. There are new hypermetabolic nodes more superiorly in the right side of the mediastinum along with LUL nodular area with more uptake.  --CT chest from 02/13/2024 continued decreased in size of treated mass in the anterior perihilar RUL.Interval increase in size of nodules throughout the right apex.Unchanged clustered nodules in the peripheral posterior left upper lobe.Interval enlargement of high right pretracheal, paratracheal, and supraclavicular nodes, previously FDG avid, consistent with worsened nodal metastatic disease. Similar  appearance of matted, treated low pretracheal, subcarinal, and right hilar lymph nodes PLAN: --Due for Cycle 7, Day 1 of Durvalumab  today --Labs today reviewed and adequate for treatment today. WCB 4.9, Hgb 14.2, Plt 116K, creatinine and LFTs normal. Thyroid  panel pending --Patient underwent EBUS with  biopsy on 03/17/2024. Pathology is pending for some of the biopsies. Reviewed so far the 4R lymph node and RUL lung nodule is positive for malignancy, SCC.  --Proceed with treatment today and plan for telephone visit with Dr. Onesimo next week to review remaining pathology with recommendations --Will obtain chest xray today to evaluate for post EBUS crackkles heard on exam.    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     All of the patient's questions were answered with apparent satisfaction. The patient knows to call the clinic with any problems, questions or concerns.   I have spent a total of 30 minutes minutes of face-to-face and non-face-to-face time, preparing to see the patient,performing a medically appropriate examination, counseling and educating the patient, ordering medications/tests/procedures,  documenting clinical information in the electronic health record, independently interpreting results and communicating results to the patient, and care coordination.   Johnston Police PA-C Dept of Hematology and Oncology [facility] Phone: [phone]

## 2024-03-26 ENCOUNTER — Ambulatory Visit: Admitting: Emergency Medicine

## 2024-03-26 ENCOUNTER — Ambulatory Visit: Payer: Self-pay

## 2024-03-26 LAB — T4: T4, Total: 6.3 ug/dL (ref 4.5–12.0)

## 2024-03-26 NOTE — Telephone Encounter (Signed)
-----   Message from Johnston ONEIDA Police sent at 03/25/2024  9:43 PM EDT ----- Please notify patient that chest xray shows no acute findings.  ----- Message ----- From: Interface, Rad Results In Sent: 03/25/2024   2:26 PM EDT To: Johnston ONEIDA Police, PA-C

## 2024-03-26 NOTE — Telephone Encounter (Signed)
 Pt advised with VU

## 2024-03-27 ENCOUNTER — Other Ambulatory Visit: Payer: Self-pay

## 2024-03-27 ENCOUNTER — Ambulatory Visit: Attending: Cardiology

## 2024-03-27 ENCOUNTER — Ambulatory Visit: Admitting: Emergency Medicine

## 2024-03-27 DIAGNOSIS — I472 Ventricular tachycardia, unspecified: Secondary | ICD-10-CM

## 2024-03-27 NOTE — Progress Notes (Signed)
 The proposed treatment discussed in conference is for discussion purpose only and is not a binding recommendation.  The patients have not been physically examined, or presented with their treatment options.  Therefore, final treatment plans cannot be decided.

## 2024-03-27 NOTE — Patient Instructions (Addendum)
  After Care   Monitor your site for redness, swelling, and drainage. Call the device clinic at [phone] if you experience these symptoms or fever/chills.

## 2024-03-27 NOTE — Progress Notes (Signed)
 Seen in device clinic for wound check. Wound well healed. Edges fully approximated. Minimal swelling noted. No redness or drainage noted. Wound care education provided. Aware to call device clinic for any concerns of infection.

## 2024-03-31 ENCOUNTER — Inpatient Hospital Stay: Admitting: Hematology

## 2024-03-31 DIAGNOSIS — Z7189 Other specified counseling: Secondary | ICD-10-CM

## 2024-03-31 DIAGNOSIS — C3491 Malignant neoplasm of unspecified part of right bronchus or lung: Secondary | ICD-10-CM

## 2024-03-31 DIAGNOSIS — Z5112 Encounter for antineoplastic immunotherapy: Secondary | ICD-10-CM | POA: Diagnosis not present

## 2024-03-31 DIAGNOSIS — C3411 Malignant neoplasm of upper lobe, right bronchus or lung: Secondary | ICD-10-CM | POA: Diagnosis not present

## 2024-03-31 DIAGNOSIS — Z87891 Personal history of nicotine dependence: Secondary | ICD-10-CM | POA: Diagnosis not present

## 2024-03-31 DIAGNOSIS — Z79620 Long term (current) use of immunosuppressive biologic: Secondary | ICD-10-CM | POA: Diagnosis not present

## 2024-04-02 NOTE — Progress Notes (Signed)
 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   I discussed the limitations, risks, security and privacy concerns of performing an evaluation and management service by telemedicine and the availability of in-person appointments. I also discussed with the patient that there may be a patient responsible charge related to this service. The patient expressed understanding and agreed to proceed.   Other persons participating in the visit and their role in the encounter: none   Patient's location: Home  Provider's location: [facility]   Chief Complaint: Follow-up for continued management of squamous cell carcinoma of the lung.  Patient notes no acute new symptoms.  I called her to discuss the results from her bronchoscopic biopsy.  Biopsy of mediastinal lymph nodes was consistent with squamous cell carcinoma of the lung.  Biopsy of the left upper lobe lung nodules was nondiagnostic. We discussed that the left upper lobe lung nodules would also be presumed to be a possible new primary lesion from her lung cancer. We discussed and recommended consideration of palliative radiation to her mediastinal and other recurrent lymph nodes as well as possible SBRT to her new left upper lobe lung lesion and simultaneous continuation of durvalumab .  She is agreeable to this.  We will refer her to Dr. Jomarie her radiation oncologist at [facility].   MEDICAL HISTORY:  Past  Medical History:  Diagnosis Date   AICD (automatic cardioverter/defibrillator) present dual   Medtronic - original placed 2007, generator change 2014, generator extraction 03/14/2024   Anemia    Anticoagulant long-term use    eliquis    Anxiety    Arthralgia of multiple joints    Arthritis pain    Benign hypertensive heart disease    CAD (coronary artery disease) primary cardiologist-- dr danelle taylor   MI and 2 stents 1995 in [place]    Cancer [facility])    Cataract ?   Surgery 2020   CHF NYHA class II, chronic, systolic (HCC)    followed by dr danelle taylor   Complication of anesthesia    Degenerative disc disease, lumbar    Depression    Difficult intubation    Dyslipidemia    History of basal cell carcinoma (BCC) excision    right ankle area s/p  excision in office 06/ 2019  in office   History of DVT of lower extremity yrs ago before 2012   History of MI (myocardial infarction) 1995  in [place]   History of pulmonary embolus (PE) 2012   History of ventricular tachycardia    Hyperlipidemia    Hypersomnia    Hypertension    Ischemic cardiomyopathy    Migraines    Myocardial infarction (HCC)    OA (osteoarthritis)    all over   PAF (paroxysmal atrial fibrillation) (HCC)    Pneumonia    02/2023   Primary localized osteoarthritis of right hip 12/18/2017   Primary localized osteoarthritis of right knee 05/28/2018   S/P coronary artery stent placement 1995   in [place]  05-21-2018 per pt x2  stents in same coronary artery (unsure BM or DES)   Vitamin D  deficiency disease     SURGICAL HISTORY: Past Surgical History:  Procedure Laterality Date   BIOPSY  06/19/2022   Procedure: BIOPSY;  Surgeon: Elicia Claw, MD;  Location: [facility] ENDOSCOPY;  Service: Gastroenterology;;   BRONCHIAL BIOPSY  08/07/2023   Procedure: BRONCHIAL BIOPSIES;  Surgeon: Shelah Lamar RAMAN, MD;  Location: [facility] ENDOSCOPY;  Service: Pulmonary;;   BRONCHIAL BRUSHINGS  08/07/2023   Procedure: BRONCHIAL BRUSHINGS;   Surgeon: Shelah Lamar RAMAN, MD;  Location: [facility] ENDOSCOPY;  Service: Pulmonary;;   BRONCHIAL BRUSHINGS  03/17/2024   Procedure: BRONCHOSCOPY, WITH BRUSH BIOPSY;  Surgeon: Shelah Lamar RAMAN, MD;  Location: [facility] ENDOSCOPY;  Service: Pulmonary;;   BRONCHIAL NEEDLE ASPIRATION BIOPSY  08/07/2023   Procedure: BRONCHIAL NEEDLE ASPIRATION BIOPSIES;  Surgeon: Shelah Lamar RAMAN, MD;  Location: [facility] ENDOSCOPY;  Service: Pulmonary;;   BRONCHIAL NEEDLE ASPIRATION BIOPSY  03/17/2024   Procedure: BRONCHOSCOPY, WITH NEEDLE ASPIRATION BIOPSY;  Surgeon: Shelah Lamar RAMAN, MD;  Location: [facility] ENDOSCOPY;  Service: Pulmonary;;   CARDIAC DEFIBRILLATOR PLACEMENT  2007   CARPAL TUNNEL RELEASE Right 02/26/2024   Procedure: CARPAL TUNNEL RELEASE;  Surgeon: Josefina Chew, MD;  Location: [facility] OR;  Service: Orthopedics;  Laterality: Right;   CATARACT EXTRACTION, BILATERAL     COLONOSCOPY  04/14/2013   colonic polyp, status post polypectomy. Mild panocolonic diverticulosis. Small internal hemorrhoids   CRYOTHERAPY  03/17/2024   Procedure: CRYOTHERAPY;  Surgeon: Shelah Lamar RAMAN, MD;  Location: [facility] ENDOSCOPY;  Service: Pulmonary;;   DILATION AND CURETTAGE OF UTERUS  yrs ago   DIRECT LARYNGOSCOPY Right 10/04/2022   Procedure: DIRECT LARYNGOSCOPY;  Surgeon: Llewellyn Gerard LABOR, DO;  Location: [facility] OR;  Service: ENT;  Laterality: Right;   ENDOBRONCHIAL ULTRASOUND Bilateral 03/17/2024   Procedure: ENDOBRONCHIAL ULTRASOUND (EBUS);  Surgeon: Shelah Lamar RAMAN, MD;  Location: [facility] ENDOSCOPY;  Service: Pulmonary;  Laterality: Bilateral;   ESOPHAGOGASTRODUODENOSCOPY (EGD) WITH PROPOFOL  N/A 06/19/2022   Procedure: ESOPHAGOGASTRODUODENOSCOPY (EGD) WITH PROPOFOL ;  Surgeon: Elicia Claw, MD;  Location: [facility] ENDOSCOPY;  Service: Gastroenterology;  Laterality: N/A;   EYE SURGERY  2020   Cataracts removed   FIDUCIAL MARKER PLACEMENT  08/07/2023   Procedure: FIDUCIAL MARKER PLACEMENT;  Surgeon: Shelah Lamar RAMAN, MD;  Location: [facility] ENDOSCOPY;  Service: Pulmonary;;   ICD  GENERATOR REMOVAL N/A 03/14/2024   Procedure: ICD GENERATOR REMOVAL;  Surgeon: Waddell Danelle ORN, MD;  [facility] INVASIVE CV LAB;  Service: Cardiovascular;  Laterality: N/A;   IMPACTION REMOVAL  06/19/2022   Procedure: IMPACTION REMOVAL;  Surgeon: Elicia Claw, MD;  Location: [facility] ENDOSCOPY;  Service: Gastroenterology;;   IMPLANTABLE CARDIOVERTER DEFIBRILLATOR GENERATOR CHANGE N/A 02/04/2013   Procedure: IMPLANTABLE CARDIOVERTER DEFIBRILLATOR GENERATOR CHANGE;  Surgeon: Danelle ORN Waddell, MD;  Location: [facility] CATH LAB;  Service: Cardiovascular;  Laterality: N/A;   IR IMAGING GUIDED PORT INSERTION  10/27/2022   JOINT REPLACEMENT  2019, 2022, 2023   Both hips, both knees   LYMPH NODE BIOPSY Right 10/04/2022   Procedure: EXCISIONAL OF RIGHT DEEP CERVICAL LYMPH NODE;  Surgeon: Llewellyn Gerard LABOR, DO;  [facility]: [facility] OR;  Service: ENT;  Laterality: Right;   SHOULDER ARTHROSCOPY Right 2015   TOTAL HIP ARTHROPLASTY Right 12/18/2017   Procedure: RIGHT TOTAL HIP ARTHROPLASTY;  Surgeon: Josefina Chew, MD;  [facility];  Service: Orthopedics;  Laterality: Right;   TOTAL HIP ARTHROPLASTY Left 04/19/2021   Procedure: TOTAL HIP ARTHROPLASTY;  Surgeon: Josefina Chew, MD;  Location: [facility];  Service: Orthopedics;  Laterality: Left;   TOTAL KNEE ARTHROPLASTY Right 05/28/2018   Procedure: TOTAL KNEE ARTHROPLASTY;  Surgeon: Josefina Chew, MD;  [facility]: [facility] ORS;  Service: Orthopedics;  Laterality: Right;  Adductor Block   TOTAL KNEE ARTHROPLASTY Left 08/12/2021   Procedure: TOTAL KNEE ARTHROPLASTY;  Surgeon: Josefina Chew, MD;  Location: [facility] ORS;  Service: Orthopedics;  Laterality: Left;   TUBAL LIGATION Bilateral yrs ago   VIDEO BRONCHOSCOPY WITH ENDOBRONCHIAL NAVIGATION Bilateral 03/17/2024   Procedure: VIDEO BRONCHOSCOPY WITH ENDOBRONCHIAL NAVIGATION;  Surgeon: Shelah Lamar RAMAN, MD;  Location[facility] ENDOSCOPY;  Service: Pulmonary;  Laterality: Bilateral;  need EBUS scope also   VIDEO BRONCHOSCOPY WITH RADIAL  ENDOBRONCHIAL ULTRASOUND  08/07/2023   Procedure: VIDEO BRONCHOSCOPY WITH RADIAL ENDOBRONCHIAL ULTRASOUND;  Surgeon: Shelah Lamar RAMAN, MD;  [facility] ENDOS[facility];  Service: Pulmonary;;   WISDOM TOOTH EXTRACTION      SOCIAL HISTORY: Social History   Socioeconomic History   Marital status: Single    Spouse name: Not on file   Number of children: 0   Years of education: 14   Highest education level: Not on file  Occupational History   Not on file  Tobacco Use   Smoking status: Former    Current packs/day: 0.00    Types: Cigarettes    Start date: 05/21/1966    Quit date: 05/21/1996    Years since quitting: 27.8   Smokeless tobacco: Never  Vaping Use   Vaping status: Never Used  Substance and Sexual Activity   Alcohol use: No   Drug use: Never   Sexual activity: Not on file  Other Topics Concern   Not on file  Social History Narrative   Not on file   Social Drivers of Health   Financial Resource Strain: Low Risk  (05/28/2018)   Overall Financial Resource Strain (CARDIA)    Difficulty of Paying Living Expenses: Not hard at all  Food Insecurity: No Food Insecurity (08/28/2023)   Hunger Vital Sign    Worried About Running Out of Food in the Last Year: Never true    Ran Out of Food in the Last Year: Never true  Transportation Needs: No Transportation Needs (08/28/2023)   PRAPARE - Administrator, Civil Service (Medical): No    Lack of Transportation (Non-Medical): No  Physical Activity: Not on file  Stress: Not on file  Social Connections: Not on file  Intimate Partner Violence: Not At Risk (08/28/2023)   Humiliation, Afraid, Rape, and Kick questionnaire    Fear of Current or Ex-Partner: No    Emotionally Abused: No    Physically Abused: No    Sexually Abused: No    FAMILY HISTORY: Family History  Problem Relation Age of Onset   Hypertension Mother    Thyroid  disease Mother    Alzheimer's disease Mother    Hearing loss Mother    Coronary artery disease  Father    Pulmonary embolism Father    Arthritis Father    Heart disease Father    Congestive Heart Failure Maternal Grandmother    Hypertension Maternal Grandmother    Heart attack Maternal Grandfather    Other Maternal Grandfather        carotid disease   Dementia Paternal Grandmother    Other Paternal Grandfather 25       accident   Cancer Sister     ALLERGIES:  is allergic to sumatriptan succinate.  MEDICATIONS:  Current Outpatient Medications  Medication Sig Dispense Refill  b complex vitamins capsule Take 1 capsule by mouth daily.     Calcium  Carb-Cholecalciferol  (CALCIUM  600 + D PO) Take 1 tablet by mouth daily.     carvedilol  (COREG ) 12.5 MG tablet Take 12.5 mg by mouth 2 (two) times daily with a meal.      Cholecalciferol  (VITAMIN D3) 125 MCG (5000 UT) capsule Take 5,000 Units by mouth daily.     cyanocobalamin  (VITAMIN B12) 1000 MCG tablet Take 1,000 mcg by mouth daily.     ELIQUIS  5 MG TABS tablet TAKE 1 TABLET BY MOUTH TWICE A DAY 60 tablet 5   FIBER PO Take 1 capsule by mouth 2 (two) times daily.     gabapentin  (NEURONTIN ) 300 MG capsule Take 300 mg by mouth 3 (three) times daily.     loratadine  (CLARITIN ) 10 MG tablet Take 10 mg by mouth daily.     Omega-3 Fatty Acids (FISH OIL) 1000 MG CAPS Take 1,000 mg by mouth daily.     prochlorperazine  (COMPAZINE ) 10 MG tablet Take 1 tablet (10 mg total) by mouth every 6 (six) hours as needed for nausea or vomiting. 30 tablet 1   ramipril  (ALTACE ) 2.5 MG tablet Take 2.5 mg by mouth daily.       simvastatin  (ZOCOR ) 40 MG tablet Take 40 mg by mouth at bedtime.       tiZANidine  (ZANAFLEX ) 4 MG tablet Take 2 mg by mouth every 8 (eight) hours as needed for muscle spasms.     traZODone  (DESYREL ) 50 MG tablet Take 50 mg by mouth at bedtime.      venlafaxine  (EFFEXOR ) 75 MG tablet Take 75 mg by mouth daily.     vitamin E  400 UNIT capsule Take 400 Units by mouth daily.     zonisamide  (ZONEGRAN ) 50 MG capsule Take 50-100 mg by mouth  See admin instructions. Take 50mg  by mouth in the morning and 100mg  at night.     No current facility-administered medications for this visit.    REVIEW OF SYSTEMS:    .10 Point review of Systems was done is negative except as noted above.  PHYSICAL EXAMINATION: Telemedicine visit  LABORATORY DATA:  I have reviewed the data as listed    Latest Ref Rng & Units 03/25/2024    9:45 AM 03/12/2024    9:24 AM 02/27/2024    9:05 AM  CBC  WBC 4.0 - 10.5 K/uL 4.9  4.0  2.5   Hemoglobin 12.0 - 15.0 g/dL 85.7  84.8  86.7   Hematocrit 36.0 - 46.0 % 41.0  43.1  38.5   Platelets 150 - 400 K/uL 116  94  86       Latest Ref Rng & Units 03/25/2024    9:45 AM 03/12/2024    9:24 AM 02/27/2024    9:05 AM  CMP  Glucose 70 - 99 mg/dL 91  92  93   BUN 8 - 23 mg/dL 16  14  12    Creatinine 0.44 - 1.00 mg/dL 9.29  9.37  9.40   Sodium 135 - 145 mmol/L 139  140  140   Potassium 3.5 - 5.1 mmol/L 4.2  4.2  4.1   Chloride 98 - 111 mmol/L 108  108  107   CO2 22 - 32 mmol/L 25  26  28    Calcium  8.9 - 10.3 mg/dL 9.2  9.5  9.1   Total Protein 6.5 - 8.1 g/dL 6.9  7.1  6.3   Total Bilirubin 0.0 - 1.2 mg/dL  0.5  0.6  0.5   Alkaline Phos 38 - 126 U/L 95  84  74   AST 15 - 41 U/L 18  19  18    ALT 0 - 44 U/L 14  16  11     Lab Results  Component Value Date   LDH 166 12/10/2023   Molecular Pathology 10/21/2022:    Surgical Pathology 10/04/22: A. LYMPH NODE, RIGHT LEVEL 2 DEEP CERVICAL, EXCISION:  -Diffuse large B-cell lymphoma  -See comment COMMENT: The sections show diffuse effacement of the lymph nodal architecture primarily by a population of large lymphoid cells characterized by vesicular chromatin and small nucleoli associated with apoptosis and brisk mitosis.  In some areas, the large atypical lymphoid proliferation extends into the perinodal adipose tissue.  In this background, there are scattered variably sized and somewhat disrupted aggregates of primarily small lymphoid cells characterized by  high nuclear cytoplasmic ratio, angulated nuclear contours and small to inconspicuous nucleoli. Flow cytometric analysis was performed [facility] [id number]) and shows a monoclonal, lambda restricted B-cell population expressing CD10.  In addition, immunohistochemical stains for CD3, CD5, CD10, CD20, PAX5, BCL6, Bcl-2, Ki-67, CD30, CD138, CD21, EBV in addition to in situ hybridization for kappa and lambda were performed with appropriate controls.  The large lymphoid cells are positive for CD20, PAX5, CD10, BCL6, Bcl-2, and partially for cytoplasmic lambda.  No significant staining is seen with EBV, CD30, CD138 or cytoplasmic kappa.  Ki67 shows variably increased expression (more than 50% in some areas). CD21 highlights scattered somewhat disrupted follicular dendritic networks. The lymphoid aggregates of primarily small lymphoid cells previously described show positivity for B-cell markers CD20 and PAX5 in addition to CD10 and Bcl-2.  There is an admixed variable T-cell component in the background as seen with CD3 and CD5 and there is no apparent co-expression of CD5 in B-cell areas.  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   CYTOLOGY - NON PAP  CASE: [id number]  PATIENT: Marcina Harmening  Non-Gynecological Cytology Report      Clinical History: Bilateral pulm nodules, mediastinal adenopathy, Hx non  small cell lung ca.     FINAL MICROSCOPIC DIAGNOSIS:  C. LUNG, LUL TARGET #3, FINE NEEDLE ASPIRATION:  - No malignant cells identified   CYTOLOGY - NON PAP  CASE: [id number]  PATIENT: Meliyah Leger  Non-Gynecological Cytology Report      Clinical History: Bilateral pulm nodules, mediastinal adenopathy, Hx non  small cell lung ca.     FINAL MICROSCOPIC DIAGNOSIS:  A. LUNG, RUL TARGET #2, BRUSHING:  - No malignant cells identified.  -  Benign bronchial cells.   B. LUNG, RUL TARGET #2, FINE NEEDLE ASPIRATION:  - Positive for malignancy. Squamous cell carcinoma (see comment).   Comment: This case was seen in peer review on 03/19/2024 by Dr. Janel,  who concurs with the interpretation.  This result was reported to Dr. Shelah on 03/19/2024.   RADIOGRAPHIC STUDIES: I have personally reviewed the radiological images as listed and agreed with the findings in the report. DG Chest 2 View Result Date: 03/25/2024 EXAM: 2 VIEW(S) XRAY OF THE CHEST 03/25/2024 01:03:00 PM COMPARISON: 03/17/2024 CLINICAL HISTORY: Wheezing heard on exam. FINDINGS: LINES, TUBES AND DEVICES: Right Port-A-Cath in place with tip in distal SVC. Left chest cardiac leads post generator removal. LUNGS AND PLEURA: Little change in the Poorly marginated coarse airspace opacity in the right suprahilar region  extending laterally to the pleural surface as before, with stable fiduciary marker. No pulmonary edema. No pleural effusion. No pneumothorax. HEART AND MEDIASTINUM: Calcified aorta. No acute abnormality of the cardiac and mediastinal silhouettes. BONES AND SOFT TISSUES: No acute osseous abnormality. IMPRESSION: 1. Stable  right suprahilar lesion, with  fiduciary marker. Electronically signed by: Dayne Hassell MD 03/25/2024 02:24 PM EDT RP Workstation: [id number]   DG Chest Port 1 View Result Date: 03/17/2024 CLINICAL DATA:  Status post bronchoscopy with biopsy. EXAM: PORTABLE CHEST 1 VIEW COMPARISON:  08/07/2023 and CT chest 02/13/2024. FINDINGS: Right IJ Port-A-Cath tip is in the SVC. Pacemaker and ICD lead tips are in the right atrium and right ventricle, respectively. Heart size normal. Thoracic aorta is calcified. Patchy airspace opacification in the right suprahilar region, with a pre-existing fiducial marker. Lungs are otherwise clear. No definite pneumothorax. IMPRESSION: 1. No definite pneumothorax status post bronchoscopy and biopsy. 2. Similar patchy airspace  opacification in the right upper lobe, possibly radiation related. Electronically Signed   By: Newell Eke M.D.   On: 03/17/2024 14:17   DG C-ARM BRONCHOSCOPY Result Date: 03/17/2024 C-ARM BRONCHOSCOPY: Fluoroscopy was utilized by the requesting physician.  No radiographic interpretation.   DG C-Arm 1-60 Min-No Report Result Date: 03/17/2024 Fluoroscopy was utilized by the requesting physician.  No radiographic interpretation.   EP PPM/ICD IMPLANT Result Date: 03/14/2024 Conclusion: Successful removal of her previously implanted dual-chamber ICD with relocation of the atrial and ICD leads to a subpectoral location. Dr. Adina Primus, MD was the co-surgeon during the procedure. Danelle Birmingham, MD    ASSESSMENT & PLAN:   RAELEIGH GUINN is 78 y.o.  female who presents for a follow up for continued management of squamous cell carcinoma of the lung.   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 Completed radiation therapy on 11/02/2023.  --Started Cycle 1, Day 1 of Durvalumab  on 09/18/2023.   --CT Head from 12/31/2023 showed no intracranial metastatic disease --PET scan from 12/31/2023 showed previous RUL perihilar mass and previously seen nodes in the right hilum in the mid mediastinum appears smaller and shows less uptake. There are new hypermetabolic nodes more superiorly in the right side of the mediastinum along with LUL nodular area with more uptake.  --CT chest from 02/13/2024 continued decreased in size of treated mass in the anterior perihilar RUL.Interval increase in size of nodules throughout the right apex.Unchanged clustered nodules in the peripheral posterior left upper lobe.Interval enlargement of high right pretracheal, paratracheal, and supraclavicular nodes, previously FDG avid, consistent with worsened nodal metastatic disease. Similar appearance of matted, treated low pretracheal, subcarinal, and  right hilar lymph nodes PLAN: Patient notes no new symptoms . No new toxicities from her last dose of Durvalumab . Patient had bronchoscopy with Dr. Shelah on 03/17/2024 with multiple EBUS biopsy. Mediastinal lymph nodes noted to be positive for squamous cell carcinoma as well as malignant cells noted in the right upper lobe.  Left upper lobe mass FNA nondiagnostic. - I discussed with the patient that we could either switch to next line palliative systemic therapies versus consideration of palliative radiation to her new mediastinal and supraclavicular adenopathy as well as treatment of the left upper lobe FDG avid lesion as a second primary lung cancer. - Patient prefers to go with palliative radiation and has been referred to Dr. Jomarie her radiation oncologist in [place] for consideration of this. - We discussed that we will continue palliative immunotherapy with durvalumab   alongside palliative radiation therapy to her mediastinal adenopathy and left upper lobe lung lesion. 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  Plan - No medical or lab evidence of lymphoma recurrence at this time.  Follow-up -Referral to Dr Jomarie radiation oncology in [place] for palliative RT to lung primary and mediastinal LN's -f/u fro Durvalumab  as per integrated scheduling   The total time spent in the appointment was 30 minutes*.  All of the patient's questions were answered with apparent satisfaction. The patient knows to call the clinic with any problems, questions or concerns.   Emaline Saran MD MS  AAHIVMS [facility] Hematology/Oncology Physician [facility]  .*Total Encounter Time as defined by the Centers for Medicare and Medicaid Services includes, in addition to the face-to-face time of a patient visit (documented in the note above) non-face-to-face time: obtaining and reviewing outside history, ordering and reviewing medications, tests or procedures, care coordination (communications with other health care professionals or caregivers) and documentation in the medical record.

## 2024-04-06 ENCOUNTER — Encounter: Payer: Self-pay | Admitting: Hematology

## 2024-04-09 ENCOUNTER — Inpatient Hospital Stay

## 2024-04-09 ENCOUNTER — Inpatient Hospital Stay: Admitting: Hematology

## 2024-04-09 ENCOUNTER — Ambulatory Visit
Admission: RE | Admit: 2024-04-09 | Discharge: 2024-04-09 | Disposition: A | Discharge status: Home / Self Care | Source: Ambulatory Visit | Attending: Radiation Oncology | Admitting: Radiation Oncology

## 2024-04-09 ENCOUNTER — Other Ambulatory Visit: Payer: Self-pay

## 2024-04-09 VITALS — BP 120/57 | HR 85 | Temp 97.7°F | Resp 18 | Wt 148.9 lb

## 2024-04-09 VITALS — BP 124/67 | HR 79 | Temp 98.5°F | Resp 18 | Ht 67.0 in | Wt 147.4 lb

## 2024-04-09 DIAGNOSIS — Z9221 Personal history of antineoplastic chemotherapy: Secondary | ICD-10-CM | POA: Insufficient documentation

## 2024-04-09 DIAGNOSIS — Z510 Encounter for antineoplastic radiation therapy: Secondary | ICD-10-CM | POA: Diagnosis not present

## 2024-04-09 DIAGNOSIS — I252 Old myocardial infarction: Secondary | ICD-10-CM | POA: Diagnosis not present

## 2024-04-09 DIAGNOSIS — Z87891 Personal history of nicotine dependence: Secondary | ICD-10-CM | POA: Insufficient documentation

## 2024-04-09 DIAGNOSIS — I700 Atherosclerosis of aorta: Secondary | ICD-10-CM | POA: Diagnosis not present

## 2024-04-09 DIAGNOSIS — I480 Paroxysmal atrial fibrillation: Secondary | ICD-10-CM | POA: Insufficient documentation

## 2024-04-09 DIAGNOSIS — I255 Ischemic cardiomyopathy: Secondary | ICD-10-CM | POA: Insufficient documentation

## 2024-04-09 DIAGNOSIS — Z85828 Personal history of other malignant neoplasm of skin: Secondary | ICD-10-CM | POA: Diagnosis not present

## 2024-04-09 DIAGNOSIS — Z95810 Presence of automatic (implantable) cardiac defibrillator: Secondary | ICD-10-CM | POA: Insufficient documentation

## 2024-04-09 DIAGNOSIS — Z86718 Personal history of other venous thrombosis and embolism: Secondary | ICD-10-CM | POA: Insufficient documentation

## 2024-04-09 DIAGNOSIS — I5020 Unspecified systolic (congestive) heart failure: Secondary | ICD-10-CM | POA: Diagnosis not present

## 2024-04-09 DIAGNOSIS — C3411 Malignant neoplasm of upper lobe, right bronchus or lung: Secondary | ICD-10-CM | POA: Insufficient documentation

## 2024-04-09 DIAGNOSIS — Z7901 Long term (current) use of anticoagulants: Secondary | ICD-10-CM | POA: Diagnosis not present

## 2024-04-09 DIAGNOSIS — M7989 Other specified soft tissue disorders: Secondary | ICD-10-CM | POA: Diagnosis not present

## 2024-04-09 DIAGNOSIS — Z79620 Long term (current) use of immunosuppressive biologic: Secondary | ICD-10-CM | POA: Insufficient documentation

## 2024-04-09 DIAGNOSIS — M1611 Unilateral primary osteoarthritis, right hip: Secondary | ICD-10-CM | POA: Diagnosis not present

## 2024-04-09 DIAGNOSIS — C3491 Malignant neoplasm of unspecified part of right bronchus or lung: Secondary | ICD-10-CM

## 2024-04-09 DIAGNOSIS — Z809 Family history of malignant neoplasm, unspecified: Secondary | ICD-10-CM | POA: Diagnosis not present

## 2024-04-09 DIAGNOSIS — I110 Hypertensive heart disease with heart failure: Secondary | ICD-10-CM | POA: Diagnosis not present

## 2024-04-09 DIAGNOSIS — I509 Heart failure, unspecified: Secondary | ICD-10-CM | POA: Insufficient documentation

## 2024-04-09 DIAGNOSIS — Z7189 Other specified counseling: Secondary | ICD-10-CM

## 2024-04-09 DIAGNOSIS — M51369 Other intervertebral disc degeneration, lumbar region without mention of lumbar back pain or lower extremity pain: Secondary | ICD-10-CM | POA: Insufficient documentation

## 2024-04-09 DIAGNOSIS — J029 Acute pharyngitis, unspecified: Secondary | ICD-10-CM | POA: Insufficient documentation

## 2024-04-09 DIAGNOSIS — Z923 Personal history of irradiation: Secondary | ICD-10-CM | POA: Diagnosis not present

## 2024-04-09 DIAGNOSIS — Z5112 Encounter for antineoplastic immunotherapy: Secondary | ICD-10-CM | POA: Insufficient documentation

## 2024-04-09 DIAGNOSIS — M1711 Unilateral primary osteoarthritis, right knee: Secondary | ICD-10-CM | POA: Diagnosis not present

## 2024-04-09 DIAGNOSIS — Z79899 Other long term (current) drug therapy: Secondary | ICD-10-CM | POA: Insufficient documentation

## 2024-04-09 DIAGNOSIS — E785 Hyperlipidemia, unspecified: Secondary | ICD-10-CM | POA: Diagnosis not present

## 2024-04-09 DIAGNOSIS — I2510 Atherosclerotic heart disease of native coronary artery without angina pectoris: Secondary | ICD-10-CM | POA: Diagnosis not present

## 2024-04-09 DIAGNOSIS — E559 Vitamin D deficiency, unspecified: Secondary | ICD-10-CM | POA: Insufficient documentation

## 2024-04-09 LAB — CMP (CANCER CENTER ONLY)
ALT: 10 U/L (ref 0–44)
AST: 16 U/L (ref 15–41)
Albumin: 4 g/dL (ref 3.5–5.0)
Alkaline Phosphatase: 93 U/L (ref 38–126)
Anion gap: 6 (ref 5–15)
BUN: 16 mg/dL (ref 8–23)
CO2: 26 mmol/L (ref 22–32)
Calcium: 9.2 mg/dL (ref 8.9–10.3)
Chloride: 106 mmol/L (ref 98–111)
Creatinine: 0.73 mg/dL (ref 0.44–1.00)
GFR, Estimated: >60 mL/min (ref >60)
Glucose, Bld: 130 mg/dL — ABNORMAL HIGH (ref 70–99)
Potassium: 3.7 mmol/L (ref 3.5–5.1)
Sodium: 138 mmol/L (ref 135–145)
Total Bilirubin: 0.4 mg/dL (ref 0.0–1.2)
Total Protein: 6.5 g/dL (ref 6.5–8.1)

## 2024-04-09 LAB — CBC WITH DIFFERENTIAL (CANCER CENTER ONLY)
Abs Immature Granulocytes: 0.01 K/uL (ref 0.00–0.07)
Basophils Absolute: 0 K/uL (ref 0.0–0.1)
Basophils Relative: 0 %
Eosinophils Absolute: 0.2 K/uL (ref 0.0–0.5)
Eosinophils Relative: 5 %
HCT: 40.1 % (ref 36.0–46.0)
Hemoglobin: 13.8 g/dL (ref 12.0–15.0)
Immature Granulocytes: 0 %
Lymphocytes Relative: 14 %
Lymphs Abs: 0.6 K/uL — ABNORMAL LOW (ref 0.7–4.0)
MCH: 31.2 pg (ref 26.0–34.0)
MCHC: 34.4 g/dL (ref 30.0–36.0)
MCV: 90.5 fL (ref 80.0–100.0)
Monocytes Absolute: 0.4 K/uL (ref 0.1–1.0)
Monocytes Relative: 10 %
Neutro Abs: 2.9 K/uL (ref 1.7–7.7)
Neutrophils Relative %: 71 %
Platelet Count: 92 K/uL — ABNORMAL LOW (ref 150–400)
RBC: 4.43 MIL/uL (ref 3.87–5.11)
RDW: 14.1 % (ref 11.5–15.5)
WBC Count: 4.2 K/uL (ref 4.0–10.5)
nRBC: 0 % (ref 0.0–0.2)

## 2024-04-09 LAB — TSH: TSH: 1.66 u[IU]/mL (ref 0.350–4.500)

## 2024-04-09 MED ORDER — SODIUM CHLORIDE 0.9 % IV SOLN
10.0000 mg/kg | Freq: Once | INTRAVENOUS | Status: AC
  Administered 2024-04-09: 740 mg via INTRAVENOUS
  Filled 2024-04-09: qty 4.8

## 2024-04-09 MED ORDER — SODIUM CHLORIDE 0.9 % IV SOLN: INTRAVENOUS | Status: DC

## 2024-04-09 NOTE — Progress Notes (Signed)
 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 female seen at the request of Dr. Onesimo.  I initially saw her in consultation earlier this year; she went on to receive external beam thoracic radiation to her right mediastinum/lobe, completing treatment on 11/02/2023.  Since then, she has continued to be followed by Dr. Onesimo.  PET/CT imaging obtained in June revealed decreased activity involving her treated perihilar mass and adjacent lymph nodes; however, hypermetabolic lymph nodes were seen more superiorly in the right mediastinum, including the paratracheal region and thoracic inlet, as well as the right supraclavicular region.  CT imaging of the chest performed in August revealed decreased size of her treated mass, but interval enlargement of high right pretracheal, paratracheal, and supraclavicular lymph nodes.  Biopsy of her high right mediastinal lymph nodes was performed, with pathology revealing squamous cell carcinoma.  She is seen today to discuss possible additional radiation to her new high right mediastinal lymph nodes.   PREVIOUS RADIATION THERAPY: Yes (as above)   PAST MEDICAL HISTORY:  Past Medical History:  Diagnosis Date   AICD (automatic cardioverter/defibrillator) present dual   [employer]tr[employer] - original placed 2007, generator change 2014, generator extraction 03/14/2024   Anemia    Anticoagulant long-term use    eliquis    Anxiety    Arthralgia of multiple joints    Arthritis pain    Benign hypertensive heart disease    CAD (coronary artery disease) primary cardiologist-- dr danelle taylor   MI and 2 stents 1995 in [place]     Cancer [facility])    Cataract ?   Surgery 2020   CHF NYHA class II, chronic, systolic (HCC)    followed by dr danelle taylor   Complication of anesthesia    Degenerative disc disease, lumbar    Depression    Difficult intubation    Dyslipidemia    History of basal cell carcinoma (BCC) excision    right ankle area s/p  excision in office 06/ 2019  in office   History of DVT of lower extremity yrs ago before 2012   History of MI (myocardial infarction) 1995  in [place]   History of pulmonary embolus (PE) 2012   History of ventricular tachycardia    Hyperlipidemia    Hypersomnia    Hypertension    Ischemic cardiomyopathy    Migraines    Myocardial infarction (HCC)    OA (osteoarthritis)    all over   PAF (paroxysmal atrial fibrillation) (HCC)    Pneumonia    02/2023   Primary localized osteoarthritis of right hip 12/18/2017   Primary localized osteoarthritis of right knee 05/28/2018   S/P coronary artery stent placement 1995   in [facility]   05-21-2018 per pt x2  stents in same coronary artery (unsure BM or DES)   Vitamin D  deficiency disease        PAST SURGICAL HISTORY: Past Surgical History:  Procedure Laterality Date   BIOPSY  06/19/2022   Procedure: BIOPSY;  Surgeon: Elicia Claw, MD;  Location: [facility] ENDOSCOPY;  Service: Gastroenterology;;   BRONCHIAL BIOPSY  08/07/2023   Procedure: BRONCHIAL BIOPSIES;  Surgeon: Shelah Lamar RAMAN, MD;  Location: [facility] ENDOSCOPY;  Service: Pulmonary;;   BRONCHIAL BRUSHINGS  08/07/2023   Procedure: BRONCHIAL BRUSHINGS;  Surgeon: Shelah Lamar RAMAN, MD;  Location: [facility] ENDOSCOPY;  Service: Pulmonary;;   BRONCHIAL BRUSHINGS  03/17/2024   Procedure: BRONCHOSCOPY, WITH BRUSH BIOPSY;  Surgeon: Shelah Lamar RAMAN, MD;  Location: [facility] ENDOSCOPY;  Service: Pulmonary;;   BRONCHIAL NEEDLE ASPIRATION BIOPSY  08/07/2023   Procedure: BRONCHIAL NEEDLE ASPIRATION BIOPSIES;  Surgeon: Shelah Lamar RAMAN, MD;  Location: [facility] ENDOSCOPY;  Service: Pulmonary;;   BRONCHIAL NEEDLE ASPIRATION  BIOPSY  03/17/2024   Procedure: BRONCHOSCOPY, WITH NEEDLE ASPIRATION BIOPSY;  Surgeon: Shelah Lamar RAMAN, MD;  Location: [facility] ENDOSCOPY;  Service: Pulmonary;;   CARDIAC DEFIBRILLATOR PLACEMENT  2007   CARPAL TUNNEL RELEASE Right 02/26/2024   Procedure: CARPAL TUNNEL RELEASE;  Surgeon: Josefina Chew, MD;  Location: [facility] OR;  Service: Orthopedics;  Laterality: Right;   CATARACT EXTRACTION, BILATERAL     COLONOSCOPY  04/14/2013   colonic polyp, status post polypectomy. Mild panocolonic diverticulosis. Small internal hemorrhoids   CRYOTHERAPY  03/17/2024   Procedure: CRYOTHERAPY;  Surgeon: Shelah Lamar RAMAN, MD;  Location: [facility] ENDOSCOPY;  Service: Pulmonary;;   DILATION AND CURETTAGE OF UTERUS  yrs ago   DIRECT LARYNGOSCOPY Right 10/04/2022   Procedure: DIRECT LARYNGOSCOPY;  Surgeon: Llewellyn Gerard LABOR, DO;  Location: [facility] OR;  Service: ENT;  Laterality: Right;   ENDOBRONCHIAL ULTRASOUND Bilateral 03/17/2024   Procedure: ENDOBRONCHIAL ULTRASOUND (EBUS);  Surgeon: Shelah Lamar RAMAN, MD;  Location: [facility] ENDOSCOPY;  Service: Pulmonary;  Laterality: Bilateral;   ESOPHAGOGASTRODUODENOSCOPY (EGD) WITH PROPOFOL  N/A 06/19/2022   Procedure: ESOPHAGOGASTRODUODENOSCOPY (EGD) WITH PROPOFOL ;  Surgeon: Elicia Claw, MD;  Location: [facility] ENDOSCOPY;  Service: Gastroenterology;  Laterality: N/A;   EYE SURGERY  2020   Cataracts removed   FIDUCIAL MARKER PLACEMENT  08/07/2023   Procedure: FIDUCIAL MARKER PLACEMENT;  Surgeon: Shelah Lamar RAMAN, MD;  Location: [facility] ENDOSCOPY;  Service: Pulmonary;;   ICD GENERATOR REMOVAL N/A 03/14/2024   Procedure: ICD GENERATOR REMOVAL;  Surgeon: Waddell Danelle ORN, MD;  [facility]: [facility] INVASIVE CV LAB;  Service: Cardiovascular;  Laterality: N/A;   IMPACTION REMOVAL  06/19/2022   Procedure: IMPACTION REMOVAL;  Surgeon: Elicia Claw, MD;  Location: [facility] ENDOSCOPY;  Service: Gastroenterology;;   IMPLANTABLE CARDIOVERTER DEFIBRILLATOR GENERATOR CHANGE N/A 02/04/2013   Procedure: IMPLANTABLE CARDIOVERTER  DEFIBRILLATOR GENERATOR CHANGE;  Surgeon: Danelle ORN Waddell, MD;  Location: [facility] CATH LAB;  Service: Cardiovascular;  Laterality: N/A;   IR IMAGING GUIDED PORT INSERTION  10/27/2022   JOINT REPLACEMENT  2019, 2022, 2023   Both hips, both knees   LYMPH NODE BIOPSY Right 10/04/2022   Procedure: EXCISIONAL OF RIGHT DEEP CERVICAL LYMPH NODE;  Surgeon: Llewellyn Gerard LABOR, DO;  Location: [facility] OR;  Service: ENT;  Laterality: Right;   SHOULDER ARTHROSCOPY Right 2015   TOTAL HIP ARTHROPLASTY Right 12/18/2017   Procedure: RIGHT TOTAL HIP ARTHROPLASTY;  Surgeon: Josefina Chew, MD;  [facility]: [facility] OR;  Service: Orthopedics;  Laterality: Right;   TOTAL HIP ARTHROPLASTY Left 04/19/2021   Procedure: TOTAL HIP ARTHROPLASTY;  Surgeon: Josefina Chew, MD;  Location: [facility] ORS;  Service: Orthopedics;  Laterality: Left;   TOTAL KNEE ARTHROPLASTY Right 05/28/2018   Procedure: TOTAL KNEE ARTHROPLASTY;  Surgeon: Josefina Chew, MD;  Location: [facility] ORS;  Service: Orthopedics;  Laterality: Right;  Adductor Block   TOTAL KNEE ARTHROPLASTY Left 08/12/2021   Procedure: TOTAL KNEE ARTHROPLASTY;  Surgeon: Josefina Chew, MD;  Location: [facility] ORS;  Service: Orthopedics;  Laterality: Left;  TUBAL LIGATION Bilateral yrs ago   VIDEO BRONCHOSCOPY WITH ENDOBRONCHIAL NAVIGATION Bilateral 03/17/2024   Procedure: VIDEO BRONCHOSCOPY WITH ENDOBRONCHIAL NAVIGATION;  Surgeon: Shelah Lamar RAMAN, MD;  Location: [facility] ENDOSCOPY;  Service: Pulmonary;  Laterality: Bilateral;  need EBUS scope also   VIDEO BRONCHOSCOPY WITH RADIAL ENDOBRONCHIAL ULTRASOUND  08/07/2023   Procedure: VIDEO BRONCHOSCOPY WITH RADIAL ENDOBRONCHIAL ULTRASOUND;  Surgeon: Shelah Lamar RAMAN, MD;  Location: [facility] ENDOSCOPY;  Service: Pulmonary;;   WISDOM TOOTH EXTRACTION       FAMILY HISTORY:  Family History  Problem Relation Age of Onset   Hypertension Mother    Thyroid  disease Mother    Alzheimer's disease Mother    Hearing loss Mother    Coronary artery disease Father    Pulmonary  embolism Father    Arthritis Father    Heart disease Father    Congestive Heart Failure Maternal Grandmother    Hypertension Maternal Grandmother    Heart attack Maternal Grandfather    Other Maternal Grandfather        carotid disease   Dementia Paternal Grandmother    Other Paternal Grandfather 24       accident   Cancer Sister      SOCIAL HISTORY:  reports that she quit smoking about 27 years ago. Her smoking use included cigarettes. She started smoking about 57 years ago. She has never used smokeless tobacco. She reports that she does not drink alcohol and does not use drugs.   ALLERGIES: Sumatriptan succinate   MEDICATIONS:  Current Outpatient Medications  Medication Sig Dispense Refill   b complex vitamins capsule Take 1 capsule by mouth daily.     Calcium  Carb-Cholecalciferol  (CALCIUM  600 + D PO) Take 1 tablet by mouth daily.     carvedilol  (COREG ) 12.5 MG tablet Take 12.5 mg by mouth 2 (two) times daily with a meal.      Cholecalciferol  (VITAMIN D3) 125 MCG (5000 UT) capsule Take 5,000 Units by mouth daily.     cyanocobalamin  (VITAMIN B12) 1000 MCG tablet Take 1,000 mcg by mouth daily.     ELIQUIS  5 MG TABS tablet TAKE 1 TABLET BY MOUTH TWICE A DAY 60 tablet 5   FIBER PO Take 1 capsule by mouth 2 (two) times daily.     gabapentin  (NEURONTIN ) 300 MG capsule Take 300 mg by mouth 3 (three) times daily.     loratadine  (CLARITIN ) 10 MG tablet Take 10 mg by mouth daily.     Omega-3 Fatty Acids (FISH OIL) 1000 MG CAPS Take 1,000 mg by mouth daily.     prochlorperazine  (COMPAZINE ) 10 MG tablet Take 1 tablet (10 mg total) by mouth every 6 (six) hours as needed for nausea or vomiting. 30 tablet 1   ramipril  (ALTACE ) 2.5 MG tablet Take 2.5 mg by mouth daily.       simvastatin  (ZOCOR ) 40 MG tablet Take 40 mg by mouth at bedtime.       tiZANidine  (ZANAFLEX ) 4 MG tablet Take 2 mg by mouth every 8 (eight) hours as needed for muscle spasms.     traZODone  (DESYREL ) 50 MG tablet Take 50  mg by mouth at bedtime.      venlafaxine  (EFFEXOR ) 75 MG tablet Take 75 mg by mouth daily.     vitamin E  400 UNIT capsule Take 400 Units by mouth daily.     zonisamide  (ZONEGRAN ) 50 MG capsule Take 50-100 mg by mouth See admin instructions. Take 50mg  by mouth in the morning and 100mg  at night.  No current facility-administered medications for this encounter.   Facility-Administered Medications Ordered in Other Encounters  Medication Dose Route Frequency Provider Last Rate Last Admin   0.9 %  sodium chloride  infusion   Intravenous Continuous Kale, Gautam Kishore, MD 10 mL/hr at 04/09/24 1425 New Bag at 04/09/24 1425   durvalumab  (IMFINZI ) 740 mg in sodium chloride  0.9 % 100 mL chemo infusion  10 mg/kg (Treatment Plan Recorded) Intravenous Once Kale, Gautam Kishore, MD 115 mL/hr at 04/09/24 1507 740 mg at 04/09/24 1507     REVIEW OF SYSTEMS: On review of systems, the patient reports that she ate is doing well overall.  She denies any chest pain, shortness of breath, cough, fevers, chills, night sweats, unintended weight changes. She denies any bowel or bladder disturbances, and denies abdominal pain, nausea or vomiting.  She denies any new musculoskeletal or joint aches or pains. A complete review of systems is obtained and is otherwise negative.     PHYSICAL EXAM:  Wt Readings from Last 3 Encounters:  04/09/24 148 lb 14.4 oz (67.5 kg)  04/09/24 147 lb 6.4 oz (66.9 kg)  03/25/24 148 lb 3.2 oz (67.2 kg)   Temp Readings from Last 3 Encounters:  04/09/24 97.7 F (36.5 C)  04/09/24 98.5 F (36.9 C) (Oral)  03/25/24 97.7 F (36.5 C)   BP Readings from Last 3 Encounters:  04/09/24 (!) 120/57  04/09/24 124/67  03/25/24 129/73   Pulse Readings from Last 3 Encounters:  04/09/24 85  04/09/24 79  03/25/24 87   Pain Assessment Pain Score: 0-No pain/10  In general this is a well appearing female in no acute distress.  She's alert and oriented x4 and appropriate throughout the  examination. Cardiopulmonary assessment is negative for acute distress and she exhibits normal effort.  No cervical or supraclavicular lymphadenopathy is appreciated.  Her abdomen is nontender and nondistended.    ECOG = 0  0 - Asymptomatic (Fully active, able to carry on all predisease activities without restriction)  1 - Symptomatic but completely ambulatory (Restricted in physically strenuous activity but ambulatory and able to carry out work of a light or sedentary nature. For example, light housework, office work)  2 - Symptomatic, <50% in bed during the day (Ambulatory and capable of all self care but unable to carry out any work activities. Up and about more than 50% of waking hours)  3 - Symptomatic, >50% in bed, but not bedbound (Capable of only limited self-care, confined to bed or chair 50% or more of waking hours)  4 - Bedbound (Completely disabled. Cannot carry on any self-care. Totally confined to bed or chair)  5 - Death   Raylene MM, Creech RH, Tormey DC, et al. [phone]). Toxicity and response criteria of the [facility]. Am. DOROTHA Bridges. Oncol. 5 (6): 649-55    LABORATORY DATA:  Lab Results  Component Value Date   WBC 4.2 04/09/2024   HGB 13.8 04/09/2024   HCT 40.1 04/09/2024   MCV 90.5 04/09/2024   PLT 92 (L) 04/09/2024   Lab Results  Component Value Date   NA 138 04/09/2024   K 3.7 04/09/2024   CL 106 04/09/2024   CO2 26 04/09/2024   Lab Results  Component Value Date   ALT 10 04/09/2024   AST 16 04/09/2024   ALKPHOS 93 04/09/2024   BILITOT 0.4 04/09/2024      RADIOGRAPHY: DG Chest 2 View Result Date: 03/25/2024 EXAM: 2 VIEW(S) XRAY OF THE CHEST 03/25/2024 01:03:00 PM  COMPARISON: 03/17/2024 CLINICAL HISTORY: Wheezing heard on exam. FINDINGS: LINES, TUBES AND DEVICES: Right Port-A-Cath in place with tip in distal SVC. Left chest cardiac leads post generator removal. LUNGS AND PLEURA: Little change in the Poorly marginated coarse  airspace opacity in the right suprahilar region extending laterally to the pleural surface as before, with stable fiduciary marker. No pulmonary edema. No pleural effusion. No pneumothorax. HEART AND MEDIASTINUM: Calcified aorta. No acute abnormality of the cardiac and mediastinal silhouettes. BONES AND SOFT TISSUES: No acute osseous abnormality. IMPRESSION: 1. Stable  right suprahilar lesion, with  fiduciary marker. Electronically signed by: Dayne Hassell MD 03/25/2024 02:24 PM EDT RP Workstation: [id number]   DG Chest Port 1 View Result Date: 03/17/2024 CLINICAL DATA:  Status post bronchoscopy with biopsy. EXAM: PORTABLE CHEST 1 VIEW COMPARISON:  08/07/2023 and CT chest 02/13/2024. FINDINGS: Right IJ Port-A-Cath tip is in the SVC. Pacemaker and ICD lead tips are in the right atrium and right ventricle, respectively. Heart size normal. Thoracic aorta is calcified. Patchy airspace opacification in the right suprahilar region, with a pre-existing fiducial marker. Lungs are otherwise clear. No definite pneumothorax. IMPRESSION: 1. No definite pneumothorax status post bronchoscopy and biopsy. 2. Similar patchy airspace opacification in the right upper lobe, possibly radiation related. Electronically Signed   By: Newell Eke M.D.   On: 03/17/2024 14:17   DG C-ARM BRONCHOSCOPY Result Date: 03/17/2024 C-ARM BRONCHOSCOPY: Fluoroscopy was utilized by the requesting physician.  No radiographic interpretation.   DG C-Arm 1-60 Min-No Report Result Date: 03/17/2024 Fluoroscopy was utilized by the requesting physician.  No radiographic interpretation.   EP PPM/ICD IMPLANT Result Date: 03/14/2024 Conclusion: Successful removal of her previously implanted dual-chamber ICD with relocation of the atrial and ICD leads to a subpectoral location. Dr. Adina Primus, MD was the co-surgeon during the procedure. Danelle Birmingham, MD       IMPRESSION/PLAN: 1.  The patient is a 78 year old female with squamous cell carcinoma of  the right upper lung, previously treated with chemoradiation.  She now presents with new disease located superiorly to the previously treated lesion, biopsy of which revealed squamous cell carcinoma.  She continues to receive Durvalumab .  I recommended to her that we proceed with external beam radiation to her new areas of disease involving the right superior mediastinum; I explained the rationale behind this recommendation, as well as the potential side effects of external beam radiation in her clinical scenario.  I explained that these may include, but are not limited to, skin irritation, fatigue and sore throat/discomfort with swallowing.  I explained that potential long-term side effects of treatment may include, but are not limited to, chronic injury to lung and other normal tissues.  Given potential overlap with her previously treated area, I have recommended treating with relatively small daily fraction size, to be delivered over 3 to 4 weeks with daily image guidance.  She expressed understanding, and is agreeable to proceed.  Arrangements will be made for her to return to our department for simulation and treatment planning.  In a visit lasting 40 minutes, greater than 50% of the time was spent face to face discussing the patient's condition, in preparation for the discussion, and coordinating the patient's care.    Earnestene Angello A. Jomarie, MD   **Disclaimer: This note was dictated with voice recognition software. Similar sounding words can inadvertently be transcribed and this note may contain transcription errors which may not have been corrected upon publication of note.**

## 2024-04-09 NOTE — Progress Notes (Signed)
 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 female who is here for follow-up for continued management of squamous cell carcinoma of the lung.    Rachel Bates reports that she feels okay with no acute new concerns.  She did see Dr. Jomarie from radiation oncology since her last phone visit with us  and has been scheduled for palliative radiation. No new acute symptoms from her durvalumab  immunotherapy. No fevers no chills no night sweats.  No new shortness of breath chest pain or hemoptysis.   MEDICAL HISTORY:  Past Medical History:  Diagnosis Date   AICD (automatic cardioverter/defibrillator) present dual   Medtronic - original placed 2007, generator change 2014, generator extraction 03/14/2024   Anemia    Anticoagulant long-term use    eliquis    Anxiety    Arthralgia of multiple joints    Arthritis pain    Benign hypertensive heart disease    CAD (coronary artery disease) primary cardiologist-- dr danelle taylor   MI and 2 stents 1995 in [place]    Cancer [facility])    Cataract ?   Surgery 2020   CHF NYHA class II, chronic, systolic (HCC)    followed by dr danelle taylor   Complication of anesthesia    Degenerative disc disease, lumbar    Depression    Difficult intubation    Dyslipidemia    History of basal cell carcinoma (BCC) excision    right ankle area s/p  excision in office 06/ 2019  in office   History of DVT of lower extremity yrs ago before 2012   History of MI (myocardial infarction) 1995  in [place]   History of pulmonary embolus (PE) 2012    History of ventricular tachycardia    Hyperlipidemia    Hypersomnia    Hypertension    Ischemic cardiomyopathy    Migraines    Myocardial infarction (HCC)    OA (osteoarthritis)    all over   PAF (paroxysmal atrial fibrillation) (HCC)    Pneumonia    02/2023   Primary localized osteoarthritis of right hip 12/18/2017   Primary localized osteoarthritis of right knee 05/28/2018   S/P coronary artery stent placement 1995   in [facility]   05-21-2018 per pt x2  stents in same coronary artery (unsure BM or DES)   Vitamin D  deficiency disease     SURGICAL HISTORY: Past Surgical History:  Procedure Laterality Date   BIOPSY  06/19/2022   Procedure: BIOPSY;  Surgeon: Elicia Claw, MD;  Location: [facility] ENDOSCOPY;  Service: Gastroenterology;;   BRONCHIAL BIOPSY  08/07/2023   Procedure: BRONCHIAL BIOPSIES;  Surgeon: Shelah Lamar RAMAN, MD;  Location: [facility] ENDOSCOPY;  Service: Pulmonary;;   BRONCHIAL BRUSHINGS  08/07/2023   Procedure: BRONCHIAL BRUSHINGS;  Surgeon: Shelah Lamar RAMAN, MD;  Location: [facility] ENDOSCOPY;  Service: Pulmonary;;   BRONCHIAL BRUSHINGS  03/17/2024   Procedure: BRONCHOSCOPY, WITH BRUSH BIOPSY;  Surgeon: Shelah Lamar RAMAN, MD;  Location: [facility] ENDOSCOPY;  Service: Pulmonary;;   BRONCHIAL NEEDLE ASPIRATION BIOPSY  08/07/2023   Procedure: BRONCHIAL NEEDLE ASPIRATION BIOPSIES;  Surgeon: Shelah Lamar RAMAN, MD;  Location: [facility] ENDOSCOPY;  Service: Pulmonary;;   BRONCHIAL NEEDLE ASPIRATION BIOPSY  03/17/2024   Procedure: BRONCHOSCOPY, WITH NEEDLE ASPIRATION BIOPSY;  Surgeon: Shelah Lamar RAMAN, MD;  Location: [facility] ENDOSCOPY;  Service: Pulmonary;;   CARDIAC DEFIBRILLATOR PLACEMENT  2007   CARPAL TUNNEL RELEASE Right 02/26/2024   Procedure: CARPAL TUNNEL RELEASE;  Surgeon: Josefina Chew, MD;  Location: [facility] OR;  Service: Orthopedics;  Laterality: Right;   CATARACT EXTRACTION, BILATERAL     COLONOSCOPY  04/14/2013   colonic polyp, status post polypectomy. Mild panocolonic diverticulosis. Small internal  hemorrhoids   CRYOTHERAPY  03/17/2024   Procedure: CRYOTHERAPY;  Surgeon: Shelah Lamar RAMAN, MD;  Location: [facility] ENDOSCOPY;  Service: Pulmonary;;   DILATION AND CURETTAGE OF UTERUS  yrs ago   DIRECT LARYNGOSCOPY Right 10/04/2022   Procedure: DIRECT LARYNGOSCOPY;  Surgeon: Llewellyn Gerard LABOR, DO;  Location: [facility] OR;  Service: ENT;  Laterality: Right;   ENDOBRONCHIAL ULTRASOUND Bilateral 03/17/2024   Procedure: ENDOBRONCHIAL ULTRASOUND (EBUS);  Surgeon: Shelah Lamar RAMAN, MD;  Location: [facility] ENDOSCOPY;  Service: Pulmonary;  Laterality: Bilateral;   ESOPHAGOGASTRODUODENOSCOPY (EGD) WITH PROPOFOL  N/A 06/19/2022   Procedure: ESOPHAGOGASTRODUODENOSCOPY (EGD) WITH PROPOFOL ;  Surgeon: Elicia Claw, MD;  Location: [facility] ENDOSCOPY;  Service: Gastroenterology;  Laterality: N/A;   EYE SURGERY  2020   Cataracts removed   FIDUCIAL MARKER PLACEMENT  08/07/2023   Procedure: FIDUCIAL MARKER PLACEMENT;  Surgeon: Shelah Lamar RAMAN, MD;  Location: [facility] ENDOSCOPY;  Service: Pulmonary;;   ICD GENERATOR REMOVAL N/A 03/14/2024   Procedure: ICD GENERATOR REMOVAL;  Surgeon: Waddell Danelle ORN, MD;  [facility] INVASIVE CV LAB;  Service: Cardiovascular;  Laterality: N/A;   IMPACTION REMOVAL  06/19/2022   Procedure: IMPACTION REMOVAL;  Surgeon: Elicia Claw, MD;  Location: [facility] ENDOSCOPY;  Service: Gastroenterology;;   IMPLANTABLE CARDIOVERTER DEFIBRILLATOR GENERATOR CHANGE N/A 02/04/2013   Procedure: IMPLANTABLE CARDIOVERTER DEFIBRILLATOR GENERATOR CHANGE;  Surgeon: Danelle ORN Waddell, MD;  Location: [facility] CATH LAB;  Service: Cardiovascular;  Laterality: N/A;   IR IMAGING GUIDED PORT INSERTION  10/27/2022   JOINT REPLACEMENT  2019, 2022, 2023   Both hips, both knees   LYMPH NODE BIOPSY Right 10/04/2022   Procedure: EXCISIONAL OF RIGHT DEEP CERVICAL LYMPH NODE;  Surgeon: Llewellyn Gerard LABOR, DO;  [facility]: [facility] OR;  Service: ENT;  Laterality: Right;   SHOULDER ARTHROSCOPY Right 2015   TOTAL HIP ARTHROPLASTY Right 12/18/2017   Procedure:  RIGHT TOTAL HIP ARTHROPLASTY;  Surgeon: Josefina Chew, MD;  [facility] OR;  Service: Orthopedics;  Laterality: Right;   TOTAL HIP ARTHROPLASTY Left 04/19/2021   Procedure: TOTAL HIP ARTHROPLASTY;  Surgeon: Josefina Chew, MD;  Location: [facility] ORS;  Service: Orthopedics;  Laterality: Left;   TOTAL KNEE ARTHROPLASTY Right 05/28/2018   Procedure: TOTAL KNEE ARTHROPLASTY;  Surgeon: Josefina Chew, MD;  Location: [facility] ORS;  Service: Orthopedics;  Laterality: Right;  Adductor Block   TOTAL KNEE ARTHROPLASTY Left 08/12/2021   Procedure: TOTAL KNEE ARTHROPLASTY;  Surgeon: Josefina Chew, MD;  Location: [facility] ORS;  Service: Orthopedics;  Laterality: Left;   TUBAL LIGATION Bilateral yrs ago   VIDEO BRONCHOSCOPY WITH ENDOBRONCHIAL NAVIGATION Bilateral 03/17/2024   Procedure: VIDEO BRONCHOSCOPY WITH ENDOBRONCHIAL NAVIGATION;  Surgeon: Shelah Lamar RAMAN, MD;  Location: [facility] ENDOSCOPY;  Service: Pulmonary;  Laterality: Bilateral;  need EBUS scope also   VIDEO BRONCHOSCOPY WITH RADIAL ENDOBRONCHIAL ULTRASOUND  08/07/2023   Procedure: VIDEO BRONCHOSCOPY WITH RADIAL ENDOBRONCHIAL ULTRASOUND;  Surgeon: Shelah Lamar RAMAN, MD;  [facility] ENDOSCOPY;  Service: Pulmonary;;   WISDOM TOOTH EXTRACTION      SOCIAL  HISTORY: Social History   Socioeconomic History   Marital status: Single    Spouse name: Not on file   Number of children: 0   Years of education: 14   Highest education level: Not on file  Occupational History   Not on file  Tobacco Use   Smoking status: Former    Current packs/day: 0.00    Types: Cigarettes    Start date: 05/21/1966    Quit date: 05/21/1996    Years since quitting: 27.9   Smokeless tobacco: Never  Vaping Use   Vaping status: Never Used  Substance and Sexual Activity   Alcohol use: No   Drug use: Never   Sexual activity: Not on file  Other Topics Concern   Not on file  Social History Narrative   Not on file   Social Drivers of Health   Financial Resource Strain: Low Risk   (05/28/2018)   Overall Financial Resource Strain (CARDIA)    Difficulty of Paying Living Expenses: Not hard at all  Food Insecurity: No Food Insecurity (08/28/2023)   Hunger Vital Sign    Worried About Running Out of Food in the Last Year: Never true    Ran Out of Food in the Last Year: Never true  Transportation Needs: No Transportation Needs (08/28/2023)   PRAPARE - Administrator, Civil Service (Medical): No    Lack of Transportation (Non-Medical): No  Physical Activity: Not on file  Stress: Not on file  Social Connections: Not on file  Intimate Partner Violence: Not At Risk (08/28/2023)   Humiliation, Afraid, Rape, and Kick questionnaire    Fear of Current or Ex-Partner: No    Emotionally Abused: No    Physically Abused: No    Sexually Abused: No    FAMILY HISTORY: Family History  Problem Relation Age of Onset   Hypertension Mother    Thyroid  disease Mother    Alzheimer's disease Mother    Hearing loss Mother    Coronary artery disease Father    Pulmonary embolism Father    Arthritis Father    Heart disease Father    Congestive Heart Failure Maternal Grandmother    Hypertension Maternal Grandmother    Heart attack Maternal Grandfather    Other Maternal Grandfather        carotid disease   Dementia Paternal Grandmother    Other Paternal Grandfather 55       accident   Cancer Sister     ALLERGIES:  is allergic to sumatriptan succinate.  MEDICATIONS:  Current Outpatient Medications  Medication Sig Dispense Refill   b complex vitamins capsule Take 1 capsule by mouth daily.     Calcium  Carb-Cholecalciferol  (CALCIUM  600 + D PO) Take 1 tablet by mouth daily.     carvedilol  (COREG ) 12.5 MG tablet Take 12.5 mg by mouth 2 (two) times daily with a meal.      Cholecalciferol  (VITAMIN D3) 125 MCG (5000 UT) capsule Take 5,000 Units by mouth daily.     cyanocobalamin  (VITAMIN B12) 1000 MCG tablet Take 1,000 mcg by mouth daily.     ELIQUIS  5 MG TABS tablet TAKE 1  TABLET BY MOUTH TWICE A DAY 60 tablet 5   FIBER PO Take 1 capsule by mouth 2 (two) times daily.     gabapentin  (NEURONTIN ) 300 MG capsule Take 300 mg by mouth 3 (three) times daily.     loratadine  (CLARITIN ) 10 MG tablet Take 10 mg by mouth daily.     Omega-3  Fatty Acids (FISH OIL) 1000 MG CAPS Take 1,000 mg by mouth daily.     prochlorperazine  (COMPAZINE ) 10 MG tablet Take 1 tablet (10 mg total) by mouth every 6 (six) hours as needed for nausea or vomiting. 30 tablet 1   ramipril  (ALTACE ) 2.5 MG tablet Take 2.5 mg by mouth daily.       simvastatin  (ZOCOR ) 40 MG tablet Take 40 mg by mouth at bedtime.       tiZANidine  (ZANAFLEX ) 4 MG tablet Take 2 mg by mouth every 8 (eight) hours as needed for muscle spasms.     traZODone  (DESYREL ) 50 MG tablet Take 50 mg by mouth at bedtime.      venlafaxine  (EFFEXOR ) 75 MG tablet Take 75 mg by mouth daily.     vitamin E  400 UNIT capsule Take 400 Units by mouth daily.     zonisamide  (ZONEGRAN ) 50 MG capsule Take 50-100 mg by mouth See admin instructions. Take 50mg  by mouth in the morning and 100mg  at night.     No current facility-administered medications for this visit.    REVIEW OF SYSTEMS:   .10 Point review of Systems was done is negative except as noted above.   PHYSICAL EXAMINATION: .BP (!) 120/57   Pulse 85   Temp 97.7 F (36.5 C)   Resp 18   Wt 148 lb 14.4 oz (67.5 kg)   SpO2 96%   BMI 23.32 kg/m  . GENERAL:alert, in no acute distress and comfortable SKIN: no acute rashes, no significant lesions EYES: conjunctiva are pink and non-injected, sclera anicteric OROPHARYNX: MMM, no exudates, no oropharyngeal erythema or ulceration NECK: supple, no JVD LYMPH:  no palpable lymphadenopathy in the cervical, axillary or inguinal regions LUNGS: clear to auscultation b/l with normal respiratory effort HEART: regular rate & rhythm ABDOMEN:  normoactive bowel sounds , non tender, not distended. Extremity: no pedal edema PSYCH: alert & oriented x 3  with fluent speech NEURO: no focal motor/sensory deficits   LABORATORY DATA:  I have reviewed the data as listed    Latest Ref Rng & Units 04/09/2024   12:27 PM 03/25/2024    9:45 AM 03/12/2024    9:24 AM  CBC  WBC 4.0 - 10.5 K/uL 4.2  4.9  4.0   Hemoglobin 12.0 - 15.0 g/dL 86.1  85.7  84.8   Hematocrit 36.0 - 46.0 % 40.1  41.0  43.1   Platelets 150 - 400 K/uL 92  116  94       Latest Ref Rng & Units 04/09/2024   12:27 PM 03/25/2024    9:45 AM 03/12/2024    9:24 AM  CMP  Glucose 70 - 99 mg/dL 869  91  92   BUN 8 - 23 mg/dL 16  16  14    Creatinine 0.44 - 1.00 mg/dL 9.26  9.29  9.37   Sodium 135 - 145 mmol/L 138  139  140   Potassium 3.5 - 5.1 mmol/L 3.7  4.2  4.2   Chloride 98 - 111 mmol/L 106  108  108   CO2 22 - 32 mmol/L 26  25  26    Calcium  8.9 - 10.3 mg/dL 9.2  9.2  9.5   Total Protein 6.5 - 8.1 g/dL 6.5  6.9  7.1   Total Bilirubin 0.0 - 1.2 mg/dL 0.4  0.5  0.6   Alkaline Phos 38 - 126 U/L 93  95  84   AST 15 - 41 U/L 16  18  19  ALT 0 - 44 U/L 10  14  16     Lab Results  Component Value Date   LDH 166 12/10/2023   Molecular Pathology 10/21/2022:    Surgical Pathology 10/04/22: A. LYMPH NODE, RIGHT LEVEL 2 DEEP CERVICAL, EXCISION:  -Diffuse large B-cell lymphoma  -See comment COMMENT: The sections show diffuse effacement of the lymph nodal architecture primarily by a population of large lymphoid cells characterized by vesicular chromatin and small nucleoli associated with apoptosis and brisk mitosis.  In some areas, the large atypical lymphoid proliferation extends into the perinodal adipose tissue.  In this background, there are scattered variably sized and somewhat disrupted aggregates of primarily small lymphoid cells characterized by high nuclear cytoplasmic ratio, angulated nuclear contours and small to inconspicuous nucleoli. Flow cytometric analysis was performed [facility] [id number]) and shows a monoclonal, lambda restricted B-cell population expressing CD10.   In addition, immunohistochemical stains for CD3, CD5, CD10, CD20, PAX5, BCL6, Bcl-2, Ki-67, CD30, CD138, CD21, EBV in addition to in situ hybridization for kappa and lambda were performed with appropriate controls.  The large lymphoid cells are positive for CD20, PAX5, CD10, BCL6, Bcl-2, and partially for cytoplasmic lambda.  No significant staining is seen with EBV, CD30, CD138 or cytoplasmic kappa.  Ki67 shows variably increased expression (more than 50% in some areas). CD21 highlights scattered somewhat disrupted follicular dendritic networks. The lymphoid aggregates of primarily small lymphoid cells previously described show positivity for B-cell markers CD20 and PAX5 in addition to CD10 and Bcl-2.  There is an admixed variable T-cell component in the background as seen with CD3 and CD5 and there is no apparent co-expression of CD5 in B-cell areas.  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   CYTOLOGY - NON PAP  CASE: [id number]  PATIENT: Darlyne Nitsch  Non-Gynecological Cytology Report      Clinical History: Bilateral pulm nodules, mediastinal adenopathy, Hx non  small cell lung ca.     FINAL MICROSCOPIC DIAGNOSIS:  C. LUNG, LUL TARGET #3, FINE NEEDLE ASPIRATION:  - No malignant cells identified   CYTOLOGY - NON PAP  CASE: [id number]  PATIENT: Tyffany Cheeks  Non-Gynecological Cytology Report      Clinical History: Bilateral pulm nodules, mediastinal adenopathy, Hx non  small cell lung ca.     FINAL MICROSCOPIC DIAGNOSIS:  A. LUNG, RUL TARGET #2, BRUSHING:  - No malignant cells identified.  - Benign bronchial cells.   B. LUNG, RUL TARGET #2, FINE NEEDLE ASPIRATION:  - Positive for malignancy. Squamous cell carcinoma (see comment).   Comment: This case was seen in peer review on 03/19/2024 by Dr. Janel,  who  concurs with the interpretation.  This result was reported to Dr. Shelah on 03/19/2024.   RADIOGRAPHIC STUDIES: I have personally reviewed the radiological images as listed and agreed with the findings in the report. DG Chest 2 View Result Date: 03/25/2024 EXAM: 2 VIEW(S) XRAY OF THE CHEST 03/25/2024 01:03:00 PM COMPARISON: 03/17/2024 CLINICAL HISTORY: Wheezing heard on exam. FINDINGS: LINES, TUBES AND DEVICES: Right Port-A-Cath in place with tip in distal SVC. Left chest cardiac leads post generator removal. LUNGS AND PLEURA: Little change in the Poorly marginated coarse airspace opacity in the right suprahilar region extending laterally to the pleural surface as before, with stable fiduciary marker. No pulmonary edema. No pleural effusion. No pneumothorax. HEART AND MEDIASTINUM: Calcified aorta. No acute abnormality of the cardiac and  mediastinal silhouettes. BONES AND SOFT TISSUES: No acute osseous abnormality. IMPRESSION: 1. Stable  right suprahilar lesion, with  fiduciary marker. Electronically signed by: Dayne Hassell MD 03/25/2024 02:24 PM EDT RP Workstation: [id number]   DG Chest Port 1 View Result Date: 03/17/2024 CLINICAL DATA:  Status post bronchoscopy with biopsy. EXAM: PORTABLE CHEST 1 VIEW COMPARISON:  08/07/2023 and CT chest 02/13/2024. FINDINGS: Right IJ Port-A-Cath tip is in the SVC. Pacemaker and ICD lead tips are in the right atrium and right ventricle, respectively. Heart size normal. Thoracic aorta is calcified. Patchy airspace opacification in the right suprahilar region, with a pre-existing fiducial marker. Lungs are otherwise clear. No definite pneumothorax. IMPRESSION: 1. No definite pneumothorax status post bronchoscopy and biopsy. 2. Similar patchy airspace opacification in the right upper lobe, possibly radiation related. Electronically Signed   By: Newell Eke M.D.   On: 03/17/2024 14:17   DG C-ARM BRONCHOSCOPY Result Date: 03/17/2024 C-ARM BRONCHOSCOPY: Fluoroscopy was  utilized by the requesting physician.  No radiographic interpretation.   DG C-Arm 1-60 Min-No Report Result Date: 03/17/2024 Fluoroscopy was utilized by the requesting physician.  No radiographic interpretation.   EP PPM/ICD IMPLANT Result Date: 03/14/2024 Conclusion: Successful removal of her previously implanted dual-chamber ICD with relocation of the atrial and ICD leads to a subpectoral location. Dr. Adina Primus, MD was the co-surgeon during the procedure. Danelle Birmingham, MD    ASSESSMENT & PLAN:   CHARMEL PRONOVOST is 78 y.o.  female who presents for a follow up for continued management of squamous cell carcinoma of the lung.   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 Completed radiation therapy on 11/02/2023.  --Started Cycle 1, Day 1 of Durvalumab  on 09/18/2023.   --CT Head from 12/31/2023 showed no intracranial metastatic disease --PET scan from 12/31/2023 showed previous RUL perihilar mass and previously seen nodes in the right hilum in the mid mediastinum appears smaller and shows less uptake. There are new hypermetabolic nodes more superiorly in the right side of the mediastinum along with LUL nodular area with more uptake.  --CT chest from 02/13/2024 continued decreased in size of treated mass in the anterior perihilar RUL.Interval increase in size of nodules throughout the right apex.Unchanged clustered nodules in the peripheral posterior left upper lobe.Interval enlargement of high right pretracheal, paratracheal, and supraclavicular nodes, previously FDG avid, consistent with worsened nodal metastatic disease. Similar appearance of matted, treated low pretracheal, subcarinal, and right hilar lymph nodes PLAN:  - If no new symptoms from her underlying lung cancer. - No new toxicities from her durvalumab . - [employer] testing done previously did not show any targetable mutations. - Patient has seen  Dr. Jomarie who will be proceeding with palliative radiation to her new mediastinal lymph nodes and left upper lobe lung mass. We will plan to continue Durvalumab  at this time. - I discussed with the patient that if she has progression despite palliative radiation therapy and immunotherapy will need to go to second line systemic therapies.  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  Plan No lab or clinical evidence of recurrence of her lymphoma at this time  Follow-up Continue durvalumab  as per  integrated scheduling  .The total time spent in the appointment was 30 minutes* .  All of the patient's questions were answered with apparent satisfaction. The patient knows to call the clinic with any problems, questions or concerns.   Emaline Saran MD MS AAHIVMS [facility] Hematology/Oncology Physician [facility]  .*Total Encounter Time as defined by the Centers for Medicare and Medicaid Services includes, in addition to the face-to-face time of a patient visit (documented in the note above) non-face-to-face time: obtaining and reviewing outside history, ordering and reviewing medications, tests or procedures, care coordination (communications with other health care professionals or caregivers) and documentation in the medical record.

## 2024-04-09 NOTE — Patient Instructions (Signed)
 [facility] - A DEPT OF [place]  [facility]  Discharge Instructions: Thank you for choosing [facility] to provide your oncology and hematology care.   If you have a lab appointment with the Cancer Center, please go directly to the Cancer Center and check in at the registration area.   Wear comfortable clothing and clothing appropriate for easy access to any Portacath or PICC line.   We strive to give you quality time with your provider. You may need to reschedule your appointment if you arrive late (15 or more minutes).  Arriving late affects you and other patients whose appointments are after yours.  Also, if you miss three or more appointments without notifying the office, you may be dismissed from the clinic at the provider's discretion.      For prescription refill requests, have your pharmacy contact our office and allow 72 hours for refills to be completed.    Today you received the following chemotherapy and/or immunotherapy agents imfinzi       To help prevent nausea and vomiting after your treatment, we encourage you to take your nausea medication as directed.  BELOW ARE SYMPTOMS THAT SHOULD BE REPORTED IMMEDIATELY: *FEVER GREATER THAN 100.4 F (38 C) OR HIGHER *CHILLS OR SWEATING *NAUSEA AND VOMITING THAT IS NOT CONTROLLED WITH YOUR NAUSEA MEDICATION *UNUSUAL SHORTNESS OF BREATH *UNUSUAL BRUISING OR BLEEDING *URINARY PROBLEMS (pain or burning when urinating, or frequent urination) *BOWEL PROBLEMS (unusual diarrhea, constipation, pain near the anus) TENDERNESS IN MOUTH AND THROAT WITH OR WITHOUT PRESENCE OF ULCERS (sore throat, sores in mouth, or a toothache) UNUSUAL RASH, SWELLING OR PAIN  UNUSUAL VAGINAL DISCHARGE OR ITCHING   Items with * indicate a potential emergency and should be followed up as soon as possible or go to the Emergency Department if any problems should occur.  Please show the CHEMOTHERAPY ALERT CARD or IMMUNOTHERAPY  ALERT CARD at check-in to the Emergency Department and triage nurse.  Should you have questions after your visit or need to cancel or reschedule your appointment, please contact [facility]  Dept: [phone]  and follow the prompts.  Office hours are 8:00 a.m. to 4:30 p.m. Monday - Friday. Please note that voicemails left after 4:00 p.m. may not be returned until the following business day.  We are closed weekends and major holidays. You have access to a nurse at all times for urgent questions. Please call the main number to the clinic Dept: [phone] and follow the prompts.   For any non-urgent questions, you may also contact your provider using MyChart. We now offer e-Visits for anyone 34 and older to request care online for non-urgent symptoms. For details visit [email or web address].   Also download the MyChart app! Go to the app store, search MyChart, open the app, select [facility], and log in with your MyChart username and password.

## 2024-04-10 LAB — T4: T4, Total: 6.3 ug/dL (ref 4.5–12.0)

## 2024-04-14 ENCOUNTER — Other Ambulatory Visit: Payer: Self-pay | Admitting: Pharmacist

## 2024-04-14 DIAGNOSIS — I480 Paroxysmal atrial fibrillation: Secondary | ICD-10-CM

## 2024-04-14 MED ORDER — APIXABAN 5 MG PO TABS: 5.0000 mg | ORAL_TABLET | Freq: Two times a day (BID) | ORAL | 5 refills | Status: DC

## 2024-04-15 LAB — FUNGUS CULTURE RESULT

## 2024-04-15 LAB — FUNGUS CULTURE WITH STAIN

## 2024-04-15 LAB — FUNGAL ORGANISM REFLEX

## 2024-04-16 ENCOUNTER — Encounter: Payer: Self-pay | Admitting: Hematology

## 2024-04-17 ENCOUNTER — Ambulatory Visit
Admission: RE | Admit: 2024-04-17 | Discharge: 2024-04-17 | Disposition: A | Discharge status: Home / Self Care | Source: Ambulatory Visit | Attending: Radiation Oncology | Admitting: Radiation Oncology

## 2024-04-17 DIAGNOSIS — M7989 Other specified soft tissue disorders: Secondary | ICD-10-CM | POA: Diagnosis not present

## 2024-04-17 DIAGNOSIS — C3411 Malignant neoplasm of upper lobe, right bronchus or lung: Secondary | ICD-10-CM | POA: Diagnosis not present

## 2024-04-17 DIAGNOSIS — Z510 Encounter for antineoplastic radiation therapy: Secondary | ICD-10-CM | POA: Insufficient documentation

## 2024-04-17 DIAGNOSIS — Z87891 Personal history of nicotine dependence: Secondary | ICD-10-CM | POA: Diagnosis not present

## 2024-04-17 DIAGNOSIS — Z5112 Encounter for antineoplastic immunotherapy: Secondary | ICD-10-CM | POA: Diagnosis not present

## 2024-04-17 DIAGNOSIS — Z79620 Long term (current) use of immunosuppressive biologic: Secondary | ICD-10-CM | POA: Diagnosis not present

## 2024-04-22 ENCOUNTER — Encounter (HOSPITAL_COMMUNITY): Payer: Self-pay | Admitting: Hematology

## 2024-04-22 DIAGNOSIS — Z5112 Encounter for antineoplastic immunotherapy: Secondary | ICD-10-CM | POA: Diagnosis not present

## 2024-04-22 DIAGNOSIS — M7989 Other specified soft tissue disorders: Secondary | ICD-10-CM | POA: Diagnosis not present

## 2024-04-22 DIAGNOSIS — C3491 Malignant neoplasm of unspecified part of right bronchus or lung: Secondary | ICD-10-CM | POA: Diagnosis not present

## 2024-04-22 DIAGNOSIS — Z87891 Personal history of nicotine dependence: Secondary | ICD-10-CM | POA: Diagnosis not present

## 2024-04-22 DIAGNOSIS — Z510 Encounter for antineoplastic radiation therapy: Secondary | ICD-10-CM | POA: Diagnosis not present

## 2024-04-22 DIAGNOSIS — Z79620 Long term (current) use of immunosuppressive biologic: Secondary | ICD-10-CM | POA: Diagnosis not present

## 2024-04-22 DIAGNOSIS — C3411 Malignant neoplasm of upper lobe, right bronchus or lung: Secondary | ICD-10-CM | POA: Diagnosis not present

## 2024-04-23 ENCOUNTER — Inpatient Hospital Stay

## 2024-04-23 ENCOUNTER — Inpatient Hospital Stay (HOSPITAL_BASED_OUTPATIENT_CLINIC_OR_DEPARTMENT_OTHER): Admitting: Hematology

## 2024-04-23 VITALS — BP 133/60 | HR 82 | Temp 97.7°F | Resp 18 | Wt 149.0 lb

## 2024-04-23 DIAGNOSIS — M7989 Other specified soft tissue disorders: Secondary | ICD-10-CM | POA: Diagnosis not present

## 2024-04-23 DIAGNOSIS — Z7189 Other specified counseling: Secondary | ICD-10-CM

## 2024-04-23 DIAGNOSIS — C3491 Malignant neoplasm of unspecified part of right bronchus or lung: Secondary | ICD-10-CM

## 2024-04-23 DIAGNOSIS — Z5112 Encounter for antineoplastic immunotherapy: Secondary | ICD-10-CM | POA: Diagnosis not present

## 2024-04-23 LAB — CBC WITH DIFFERENTIAL (CANCER CENTER ONLY)
Abs Immature Granulocytes: 0.01 K/uL (ref 0.00–0.07)
Basophils Absolute: 0 K/uL (ref 0.0–0.1)
Basophils Relative: 0 %
Eosinophils Absolute: 0.1 K/uL (ref 0.0–0.5)
Eosinophils Relative: 2 %
HCT: 41.3 % (ref 36.0–46.0)
Hemoglobin: 14.2 g/dL (ref 12.0–15.0)
Immature Granulocytes: 0 %
Lymphocytes Relative: 13 %
Lymphs Abs: 0.5 K/uL — ABNORMAL LOW (ref 0.7–4.0)
MCH: 31.1 pg (ref 26.0–34.0)
MCHC: 34.4 g/dL (ref 30.0–36.0)
MCV: 90.4 fL (ref 80.0–100.0)
Monocytes Absolute: 0.5 K/uL (ref 0.1–1.0)
Monocytes Relative: 12 %
Neutro Abs: 3 K/uL (ref 1.7–7.7)
Neutrophils Relative %: 73 %
Platelet Count: 96 K/uL — ABNORMAL LOW (ref 150–400)
RBC: 4.57 MIL/uL (ref 3.87–5.11)
RDW: 14.1 % (ref 11.5–15.5)
WBC Count: 4.1 K/uL (ref 4.0–10.5)
nRBC: 0 % (ref 0.0–0.2)

## 2024-04-23 LAB — TSH: TSH: 2.84 u[IU]/mL (ref 0.350–4.500)

## 2024-04-23 LAB — CMP (CANCER CENTER ONLY)
ALT: 12 U/L (ref 0–44)
AST: 16 U/L (ref 15–41)
Albumin: 4 g/dL (ref 3.5–5.0)
Alkaline Phosphatase: 94 U/L (ref 38–126)
Anion gap: 5 (ref 5–15)
BUN: 11 mg/dL (ref 8–23)
CO2: 28 mmol/L (ref 22–32)
Calcium: 9.8 mg/dL (ref 8.9–10.3)
Chloride: 106 mmol/L (ref 98–111)
Creatinine: 0.65 mg/dL (ref 0.44–1.00)
GFR, Estimated: >60 mL/min (ref >60)
Glucose, Bld: 93 mg/dL (ref 70–99)
Potassium: 3.8 mmol/L (ref 3.5–5.1)
Sodium: 139 mmol/L (ref 135–145)
Total Bilirubin: 0.5 mg/dL (ref 0.0–1.2)
Total Protein: 6.5 g/dL (ref 6.5–8.1)

## 2024-04-23 MED ORDER — SODIUM CHLORIDE 0.9 % IV SOLN
10.0000 mg/kg | Freq: Once | INTRAVENOUS | Status: AC
  Administered 2024-04-23: 740 mg via INTRAVENOUS
  Filled 2024-04-23: qty 4.8

## 2024-04-23 MED ORDER — SODIUM CHLORIDE 0.9 % IV SOLN: INTRAVENOUS | Status: DC

## 2024-04-23 NOTE — Progress Notes (Signed)
 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 female who is here for follow-up for continued management of squamous cell carcinoma of the lung.    Last seen by me on 04/09/2024 and at that time was doing well overall with no acute concerns.   Today, she says that tomorrow she will be starting Palliative Radiation tomorrow per Dr. Jomarie. Denies diarrhea, skin rashes, new cough, or hemoptysis.   Does endorse fatigue. Says that she is eating and sleeping, using a Trazodone  to help fall asleep. Also having some rhinorrhea/congestion with a persisting rattle. For management, she is reportedly using antihistamines, but no inhalers. She states that she is not officially diagnosed with COPD.  She does have a concern regarding a new lump on the back of her left leg that she noticed a month ago - initially believed she'd been bitten. Denies falls/injuries or pain.   Additionally reports experiencing pain in her right shoulder and reduced ROM when turning head.   MEDICAL HISTORY:  Past Medical History:  Diagnosis Date   AICD (automatic cardioverter/defibrillator) present dual   Medtronic - original placed 2007, generator change 2014, generator extraction 03/14/2024   Anemia    Anticoagulant long-term use    eliquis    Anxiety    Arthralgia of multiple joints    Arthritis pain    Benign hypertensive heart disease    CAD (coronary artery disease) primary cardiologist-- dr danelle taylor   MI and 2 stents 1995 in [place]    Cancer [facility])    Cataract ?   Surgery 2020   CHF  NYHA class II, chronic, systolic (HCC)    followed by dr danelle taylor   Complication of anesthesia    Degenerative disc disease, lumbar    Depression    Difficult intubation    Dyslipidemia    History of basal cell carcinoma (BCC) excision    right ankle area s/p  excision in office 06/ 2019  in office   History of DVT of lower extremity yrs ago before 2012   History of MI (myocardial infarction) 1995  in [place]   History of pulmonary embolus (PE) 2012   History of ventricular tachycardia    Hyperlipidemia    Hypersomnia    Hypertension    Ischemic cardiomyopathy    Migraines    Myocardial infarction (HCC)    OA (osteoarthritis)    all over   PAF (paroxysmal atrial fibrillation) (HCC)    Pneumonia    02/2023   Primary localized osteoarthritis of right hip 12/18/2017   Primary localized osteoarthritis of right knee 05/28/2018   S/P coronary artery stent placement 1995   [facility]   05-21-2018 per pt x2  stents in same coronary artery (unsure BM or DES)   Vitamin D  deficiency disease     SURGICAL HISTORY: Past Surgical History:  Procedure Laterality Date   BIOPSY  06/19/2022   Procedure: BIOPSY;  Surgeon: Elicia Claw, MD;  [facility];  Service: Gastroenterology;;   BRONCHIAL BIOPSY  08/07/2023   Procedure: BRONCHIAL BIOPSIES;  Surgeon: Shelah Lamar RAMAN, MD;  [facility];  Service: Pulmonary;;   BRONCHIAL BRUSHINGS  08/07/2023   Procedure: BRONCHIAL BRUSHINGS;  Surgeon: Shelah Lamar RAMAN, MD;  Location[facility] ENDOSCOPY;  Service: Pulmonary;;   BRONCHIAL BRUSHINGS  03/17/2024   Procedure: BRONCHOSCOPY, WITH BRUSH BIOPSY;  Surgeon: Shelah Lamar RAMAN, MD;  [facility];  Service: Pulmonary;;   BRONCHIAL NEEDLE ASPIRATION BIOPSY  08/07/2023   Procedure: BRONCHIAL NEEDLE ASPIRATION BIOPSIES;  Surgeon: Shelah Lamar RAMAN, MD;  [facility];  Service: Pulmonary;;   BRONCHIAL NEEDLE ASPIRATION BIOPSY  03/17/2024   Procedure: BRONCHOSCOPY, WITH NEEDLE  ASPIRATION BIOPSY;  Surgeon: Shelah Lamar RAMAN, MD;  [facility];  Service: Pulmonary;;   CARDIAC DEFIBRILLATOR PLACEMENT  2007   CARPAL TUNNEL RELEASE Right 02/26/2024   Procedure: CARPAL TUNNEL RELEASE;  Surgeon: Josefina Chew, MD;  Location[facility];  Service: Orthopedics;  Laterality: Right;   CATARACT EXTRACTION, BILATERAL     COLONOSCOPY  04/14/2013   colonic polyp, status post polypectomy. Mild panocolonic diverticulosis. Small internal hemorrhoids   CRYOTHERAPY  03/17/2024   Procedure: CRYOTHERAPY;  Surgeon: Shelah Lamar RAMAN, MD;  Location: [facility] ENDOSCOPY;  Service: Pulmonary;;   DILATION AND CURETTAGE OF UTERUS  yrs ago   DIRECT LARYNGOSCOPY Right 10/04/2022   Procedure: DIRECT LARYNGOSCOPY;  Surgeon: Llewellyn Gerard LABOR, DO;  [facility];  Service: ENT;  Laterality: Right;   ENDOBRONCHIAL ULTRASOUND Bilateral 03/17/2024   Procedure: ENDOBRONCHIAL ULTRASOUND (EBUS);  Surgeon: Shelah Lamar RAMAN, MD;  Location: [facility] ENDOSCOPY;  Service: Pulmonary;  Laterality: Bilateral;   ESOPHAGOGASTRODUODENOSCOPY (EGD) WITH PROPOFOL  N/A 06/19/2022   Procedure: ESOPHAGOGASTRODUODENOSCOPY (EGD) WITH PROPOFOL ;  Surgeon: Elicia Claw, MD;  [facility];  Service: Gastroenterology;  Laterality: N/A;   EYE SURGERY  2020   Cataracts removed   FIDUCIAL MARKER PLACEMENT  08/07/2023   Procedure: FIDUCIAL MARKER PLACEMENT;  Surgeon: Shelah Lamar RAMAN, MD;  Location: [facility] ENDOSCOPY;  Service: Pulmonary;;   ICD GENERATOR REMOVAL N/A 03/14/2024   Procedure: ICD GENERATOR REMOVAL;  Surgeon: Waddell Danelle ORN, MD;  [facility];  Service: Cardiovascular;  Laterality: N/A;   IMPACTION REMOVAL  06/19/2022   Procedure: IMPACTION REMOVAL;  Surgeon: Elicia Claw, MD;  [facility];  Service: Gastroenterology;;   IMPLANTABLE CARDIOVERTER DEFIBRILLATOR GENERATOR CHANGE N/A 02/04/2013   Procedure: IMPLANTABLE CARDIOVERTER DEFIBRILLATOR GENERATOR CHANGE;  Surgeon: Danelle ORN Waddell, MD;   Location: [facility] C[facility] LAB;  Service: Cardiovascular;  Laterality: N/A;   IR IMAGING GUIDED PORT INSERTION  10/27/2022   JOINT REPLACEMENT  2019, 2022, 2023   Both hips, both knees   LYMPH NODE BIOPSY Right 10/04/2022   Procedure: EXCISIONAL OF RIGHT DEEP CERVICAL LYMPH NODE;  Surgeon: Llewellyn Gerard LABOR, DO;  [facility];  Service: ENT;  Laterality: Right;   SHOULDER ARTHROSCOPY Right 2015   TOTAL HIP ARTHROPLASTY Right 12/18/2017   Procedure: RIGHT TOTAL HIP ARTHROPLASTY;  Surgeon: Josefina Chew, MD;  [facility];  Service: Orthopedics;  Laterality: Right;   TOTAL HIP ARTHROPLASTY Left 04/19/2021   Procedure: TOTAL HIP ARTHROPLASTY;  Surgeon: Josefina Chew, MD;  [facility];  Service: Orthopedics;  Laterality: Left;   TOTAL KNEE ARTHROPLASTY Right 05/28/2018   Procedure: TOTAL KNEE ARTHROPLASTY;  Surgeon: Josefina Chew, MD;  Location[facility];  Service: Orthopedics;  Laterality: Right;  Adductor Block   TOTAL KNEE ARTHROPLASTY Left 08/12/2021   Procedure: TOTAL KNEE ARTHROPLASTY;  Surgeon: Josefina Chew, MD;  [facility];  Service: Orthopedics;  Laterality: Left;   TUBAL LIGATION Bilateral yrs ago   VIDEO  BRONCHOSCOPY WITH ENDOBRONCHIAL NAVIGATION Bilateral 03/17/2024   Procedure: VIDEO BRONCHOSCOPY WITH ENDOBRONCHIAL NAVIGATION;  Surgeon: Shelah Lamar RAMAN, MD;  Location[facility]COPY;  Service: Pulmonary;  Laterality: Bilateral;  need EBUS scope also   VIDEO BRONCHOSCOPY WITH RADIAL ENDOBRONCHIAL ULTRASOUND  08/07/2023   Procedure: VIDEO BRONCHOSCOPY WITH RADIAL ENDOBRONCHIAL ULTRASOUND;  Surgeon: Shelah Lamar RAMAN, MD;  [facility];  Service: Pulmonary;;   WISDOM TOOTH EXTRACTION      SOCIAL HISTORY: Social History   Socioeconomic History   Marital status: Single    Spouse name: Not on file   Number of children: 0   Years of education: 14   Highest education level: Not on file  Occupational History   Not on file  Tobacco Use   Smoking status: Former     Current packs/day: 0.00    Types: Cigarettes    Start date: 05/21/1966    Quit date: 05/21/1996    Years since quitting: 27.9   Smokeless tobacco: Never  Vaping Use   Vaping status: Never Used  Substance and Sexual Activity   Alcohol use: No   Drug use: Never   Sexual activity: Not on file  Other Topics Concern   Not on file  Social History Narrative   Not on file   Social Drivers of Health   Financial Resource Strain: Low Risk  (05/28/2018)   Overall Financial Resource Strain (CARDIA)    Difficulty of Paying Living Expenses: Not hard at all  Food Insecurity: No Food Insecurity (08/28/2023)   Hunger Vital Sign    Worried About Running Out of Food in the Last Year: Never true    Ran Out of Food in the Last Year: Never true  Transportation Needs: No Transportation Needs (08/28/2023)   PRAPARE - Administrator, Civil Service (Medical): No    Lack of Transportation (Non-Medical): No  Physical Activity: Not on file  Stress: Not on file  Social Connections: Not on file  Intimate Partner Violence: Not At Risk (08/28/2023)   Humiliation, Afraid, Rape, and Kick questionnaire    Fear of Current or Ex-Partner: No    Emotionally Abused: No    Physically Abused: No    Sexually Abused: No    FAMILY HISTORY: Family History  Problem Relation Age of Onset   Hypertension Mother    Thyroid  disease Mother    Alzheimer's disease Mother    Hearing loss Mother    Coronary artery disease Father    Pulmonary embolism Father    Arthritis Father    Heart disease Father    Congestive Heart Failure Maternal Grandmother    Hypertension Maternal Grandmother    Heart attack Maternal Grandfather    Other Maternal Grandfather        carotid disease   Dementia Paternal Grandmother    Other Paternal Grandfather 76       accident   Cancer Sister     ALLERGIES:  is allergic to sumatriptan succinate.  MEDICATIONS:  Current Outpatient Medications  Medication Sig Dispense Refill    apixaban  (ELIQUIS ) 5 MG TABS tablet Take 1 tablet (5 mg total) by mouth 2 (two) times daily. 60 tablet 5   b complex vitamins capsule Take 1 capsule by mouth daily.     Calcium  Carb-Cholecalciferol  (CALCIUM  600 + D PO) Take 1 tablet by mouth daily.     carvedilol  (COREG ) 12.5 MG tablet Take 12.5 mg by mouth 2 (two) times daily with a meal.  Cholecalciferol  (VITAMIN D3) 125 MCG (5000 UT) capsule Take 5,000 Units by mouth daily.     cyanocobalamin  (VITAMIN B12) 1000 MCG tablet Take 1,000 mcg by mouth daily.     FIBER PO Take 1 capsule by mouth 2 (two) times daily.     gabapentin  (NEURONTIN ) 300 MG capsule Take 300 mg by mouth 3 (three) times daily.     loratadine  (CLARITIN ) 10 MG tablet Take 10 mg by mouth daily.     Omega-3 Fatty Acids (FISH OIL) 1000 MG CAPS Take 1,000 mg by mouth daily.     prochlorperazine  (COMPAZINE ) 10 MG tablet Take 1 tablet (10 mg total) by mouth every 6 (six) hours as needed for nausea or vomiting. 30 tablet 1   ramipril  (ALTACE ) 2.5 MG tablet Take 2.5 mg by mouth daily.       simvastatin  (ZOCOR ) 40 MG tablet Take 40 mg by mouth at bedtime.       tiZANidine  (ZANAFLEX ) 4 MG tablet Take 2 mg by mouth every 8 (eight) hours as needed for muscle spasms.     traZODone  (DESYREL ) 50 MG tablet Take 50 mg by mouth at bedtime.      venlafaxine  (EFFEXOR ) 75 MG tablet Take 75 mg by mouth daily.     vitamin E  400 UNIT capsule Take 400 Units by mouth daily.     zonisamide  (ZONEGRAN ) 50 MG capsule Take 50-100 mg by mouth See admin instructions. Take 50mg  by mouth in the morning and 100mg  at night.     No current facility-administered medications for this visit.    REVIEW OF SYSTEMS:   .10 Point review of Systems was done is negative except as noted above.   PHYSICAL EXAMINATION: .BP 133/60   Pulse 82   Temp 97.7 F (36.5 C)   Resp 18   Wt 149 lb (67.6 kg)   SpO2 96%   BMI 23.34 kg/m  . GENERAL:alert, in no acute distress and comfortable SKIN: no acute rashes, no  significant lesions EYES: conjunctiva are pink and non-injected, sclera anicteric OROPHARYNX: MMM, no exudates, no oropharyngeal erythema or ulceration NECK: supple, no JVD LYMPH:  no palpable lymphadenopathy in the cervical, axillary or inguinal regions LUNGS: clear to auscultation b/l with normal respiratory effort HEART: regular rate & rhythm ABDOMEN:  normoactive bowel sounds , non tender, not distended. Extremity: no pedal edema (+) Firm mass on left posterior thigh near surgical scar PSYCH: alert & oriented x 3 with fluent speech NEURO: no focal motor/sensory deficits   LABORATORY DATA:  I have reviewed the data as listed    Latest Ref Rng & Units 04/23/2024   10:52 AM 04/09/2024   12:27 PM 03/25/2024    9:45 AM  CBC EXTENDED  WBC 4.0 - 10.5 K/uL 4.1  4.2  4.9   RBC 3.87 - 5.11 MIL/uL 4.57  4.43  4.51   Hemoglobin 12.0 - 15.0 g/dL 85.7  86.1  85.7   HCT 36.0 - 46.0 % 41.3  40.1  41.0   Platelets 150 - 400 K/uL 96  92  116   NEUT# 1.7 - 7.7 K/uL 3.0  2.9  3.9   Lymph# 0.7 - 4.0 K/uL 0.5  0.6  0.5       Latest Ref Rng & Units 04/23/2024   10:52 AM 04/09/2024   12:27 PM 03/25/2024    9:45 AM  CMP  Glucose 70 - 99 mg/dL 93  869  91   BUN 8 - 23 mg/dL 11  16  16   Creatinine 0.44 - 1.00 mg/dL 9.34  9.26  9.29   Sodium 135 - 145 mmol/L 139  138  139   Potassium 3.5 - 5.1 mmol/L 3.8  3.7  4.2   Chloride 98 - 111 mmol/L 106  106  108   CO2 22 - 32 mmol/L 28  26  25    Calcium  8.9 - 10.3 mg/dL 9.8  9.2  9.2   Total Protein 6.5 - 8.1 g/dL 6.5  6.5  6.9   Total Bilirubin 0.0 - 1.2 mg/dL 0.5  0.4  0.5   Alkaline Phos 38 - 126 U/L 94  93  95   AST 15 - 41 U/L 16  16  18    ALT 0 - 44 U/L 12  10  14     TSH & TOTAL T4: Lab Results  Component Value Date   TSH 1.660 04/09/2024   T4TOTAL 6.3 04/09/2024   Molecular Pathology 10/21/2022:   Surgical Pathology 10/04/22:  A. LYMPH NODE, RIGHT LEVEL 2 DEEP CERVICAL, EXCISION:  -Diffuse large B-cell lymphoma  -See  comment COMMENT: The sections show diffuse effacement of the lymph nodal architecture primarily by a population of large lymphoid cells characterized by vesicular chromatin and small nucleoli associated with apoptosis and brisk mitosis.  In some areas, the large atypical lymphoid proliferation extends into the perinodal adipose tissue.  In this background, there are scattered variably sized and somewhat disrupted aggregates of primarily small lymphoid cells characterized by high nuclear cytoplasmic ratio, angulated nuclear contours and small to inconspicuous nucleoli. Flow cytometric analysis was performed [facility] [id number]) and shows a monoclonal, lambda restricted B-cell population expressing CD10.  In addition, immunohistochemical stains for CD3, CD5, CD10, CD20, PAX5, BCL6, Bcl-2, Ki-67, CD30, CD138, CD21, EBV in addition to in situ hybridization for kappa and lambda were performed with appropriate controls.  The large lymphoid cells are positive for CD20, PAX5, CD10, BCL6, Bcl-2, and partially for cytoplasmic lambda.  No significant staining is seen with EBV, CD30, CD138 or cytoplasmic kappa.  Ki67 shows variably increased expression (more than 50% in some areas). CD21 highlights scattered somewhat disrupted follicular dendritic networks. The lymphoid aggregates of primarily small lymphoid cells previously described show positivity for B-cell markers CD20 and PAX5 in addition to CD10 and Bcl-2.  There is an admixed variable T-cell component in the background as seen with CD3 and CD5 and there is no apparent co-expression of CD5 in B-cell areas.  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    03/17/2024 CYTOLOGY - NON PAP   Non-Gynecological Cytology Report   Clinical History: Bilateral pulm nodules, mediastinal adenopathy, Hx non  small cell lung  ca.   FINAL MICROSCOPIC DIAGNOSIS:  C. LUNG, LUL TARGET #3, FINE NEEDLE ASPIRATION:  - No malignant cells identified    03/17/2024 CYTOLOGY - NON PAP   Non-Gynecological Cytology Report   Clinical History: Bilateral pulm nodules, mediastinal adenopathy, Hx non  small cell lung ca.   FINAL MICROSCOPIC DIAGNOSIS:  A. LUNG, RUL TARGET #2, BRUSHING:  - No malignant cells identified.  - Benign bronchial cells.   B. LUNG, RUL TARGET #2, FINE NEEDLE ASPIRATION:  - Positive for malignancy. Squamous cell carcinoma (see comment).   Comment: This case was seen in peer review on 03/19/2024 by Dr. Janel,  who concurs with the interpretation.  This result was reported to Dr. Shelah on  03/19/2024.    04/22/2024 MOLECULAR PATHOLOGY   RADIOGRAPHIC STUDIES: I have personally reviewed the radiological images as listed and agreed with the findings in the report. DG Chest 2 View Result Date: 03/25/2024 EXAM: 2 VIEW(S) XRAY OF THE CHEST 03/25/2024 01:03:00 PM COMPARISON: 03/17/2024 CLINICAL HISTORY: Wheezing heard on exam. FINDINGS: LINES, TUBES AND DEVICES: Right Port-A-Cath in place with tip in distal SVC. Left chest cardiac leads post generator removal. LUNGS AND PLEURA: Little change in the Poorly marginated coarse airspace opacity in the right suprahilar region extending laterally to the pleural surface as before, with stable fiduciary marker. No pulmonary edema. No pleural effusion. No pneumothorax. HEART AND MEDIASTINUM: Calcified aorta. No acute abnormality of the cardiac and mediastinal silhouettes. BONES AND SOFT TISSUES: No acute osseous abnormality. IMPRESSION: 1. Stable  right suprahilar lesion, with  fiduciary marker. Electronically signed by: Katheleen Faes MD 03/25/2024 02:24 PM EDT RP Workstation: [id number]    ASSESSMENT & PLAN:   Rachel Bates is 78 y.o.  female who presents for a follow up for continued management of squamous cell carcinoma of the lung.   #Stage III RUL Squamous  cell carcinoma of the lung  --Received concurrent chemoradiation with carbo/taxol  from 09/18/2023-10/16/2023. Completed radiation therapy on 11/02/2023.  --Started Cycle 1, Day 1 of Durvalumab  on 09/18/2023.   --CT Head from 12/31/2023 showed no intracranial metastatic disease --PET scan from 12/31/2023 showed previous RUL perihilar mass and previously seen nodes in the right hilum in the mid mediastinum appears smaller and shows less uptake. There are new hypermetabolic nodes more superiorly in the right side of the mediastinum along with LUL nodular area with more uptake.  --CT chest from 02/13/2024 continued decreased in size of treated mass in the anterior perihilar RUL.Interval increase in size of nodules throughout the right apex.Unchanged clustered nodules in the peripheral posterior left upper lobe.Interval enlargement of high right pretracheal, paratracheal, and supraclavicular nodes, previously FDG avid, consistent with worsened nodal metastatic disease. 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   PLAN:  - Discussed results of 04/22/2024 molecular pathology.  She will be starting Palliative radiation with Dr. Jomarie tomorrow. We will plan to continue Durvalumab  at this time.  - For her persisting rattle, I am prescribing an Atrovent  inhaler.  - Ordering bx of left posterior thigh mass  - Informed her that reduced ROM is contributed by arthritis and tight muscles, and the tight muscle is what is causing her shoulder  pain.  Follow-up Continue durvalumab  as per integrated scheduling US  guided core needle biopsy of left posterior thigh mass  The total time spent in the appointment was 30 minutes* .  All of the patient's questions were answered with apparent satisfaction. The patient knows to call the clinic with any problems, questions or concerns.   Emaline Saran MD MS AAHIVMS [facility] Hematology/Oncology Physician [facility]  .*Total Encounter Time as defined by the Centers for Medicare and Medicaid Services includes, in addition to the face-to-face time of a patient visit (documented in the note above) non-face-to-face time: obtaining and reviewing outside history, ordering and reviewing medications, tests or  procedures, care coordination (communications with other health care professionals or caregivers) and documentation in the medical record.  I,  Damien Lagle,acting as a scribe for Emaline Saran, MD.,have documented all relevant documentation on the behalf of Emaline Saran, MD,as directed by  Emaline Saran, MD while in the presence of Emaline Saran, MD.  I have reviewed the above documentation for accuracy and completeness, and I agree with the above. Emaline Candida Saran MD.

## 2024-04-23 NOTE — Patient Instructions (Signed)
 [facility] - A DEPT OF [place]  [facility]  Discharge Instructions: Thank you for choosing [facility] to provide your oncology and hematology care.   If you have a lab appointment with the Cancer Center, please go directly to the Cancer Center and check in at the registration area.   Wear comfortable clothing and clothing appropriate for easy access to any Portacath or PICC line.   We strive to give you quality time with your provider. You may need to reschedule your appointment if you arrive late (15 or more minutes).  Arriving late affects you and other patients whose appointments are after yours.  Also, if you miss three or more appointments without notifying the office, you may be dismissed from the clinic at the provider's discretion.      For prescription refill requests, have your pharmacy contact our office and allow 72 hours for refills to be completed.    Today you received the following chemotherapy and/or immunotherapy agents imfinzi       To help prevent nausea and vomiting after your treatment, we encourage you to take your nausea medication as directed.  BELOW ARE SYMPTOMS THAT SHOULD BE REPORTED IMMEDIATELY: *FEVER GREATER THAN 100.4 F (38 C) OR HIGHER *CHILLS OR SWEATING *NAUSEA AND VOMITING THAT IS NOT CONTROLLED WITH YOUR NAUSEA MEDICATION *UNUSUAL SHORTNESS OF BREATH *UNUSUAL BRUISING OR BLEEDING *URINARY PROBLEMS (pain or burning when urinating, or frequent urination) *BOWEL PROBLEMS (unusual diarrhea, constipation, pain near the anus) TENDERNESS IN MOUTH AND THROAT WITH OR WITHOUT PRESENCE OF ULCERS (sore throat, sores in mouth, or a toothache) UNUSUAL RASH, SWELLING OR PAIN  UNUSUAL VAGINAL DISCHARGE OR ITCHING   Items with * indicate a potential emergency and should be followed up as soon as possible or go to the Emergency Department if any problems should occur.  Please show the CHEMOTHERAPY ALERT CARD or IMMUNOTHERAPY  ALERT CARD at check-in to the Emergency Department and triage nurse.  Should you have questions after your visit or need to cancel or reschedule your appointment, please contact [facility]  Dept: [phone]  and follow the prompts.  Office hours are 8:00 a.m. to 4:30 p.m. Monday - Friday. Please note that voicemails left after 4:00 p.m. may not be returned until the following business day.  We are closed weekends and major holidays. You have access to a nurse at all times for urgent questions. Please call the main number to the clinic Dept: [phone] and follow the prompts.   For any non-urgent questions, you may also contact your provider using MyChart. We now offer e-Visits for anyone 34 and older to request care online for non-urgent symptoms. For details visit [email or web address].   Also download the MyChart app! Go to the app store, search MyChart, open the app, select [facility], and log in with your MyChart username and password.

## 2024-04-24 ENCOUNTER — Other Ambulatory Visit: Payer: Self-pay

## 2024-04-24 ENCOUNTER — Ambulatory Visit
Admission: RE | Admit: 2024-04-24 | Discharge: 2024-04-24 | Disposition: A | Discharge status: Home / Self Care | Source: Ambulatory Visit | Attending: Radiation Oncology | Admitting: Radiation Oncology

## 2024-04-24 DIAGNOSIS — Z87891 Personal history of nicotine dependence: Secondary | ICD-10-CM | POA: Diagnosis not present

## 2024-04-24 DIAGNOSIS — C3411 Malignant neoplasm of upper lobe, right bronchus or lung: Secondary | ICD-10-CM | POA: Diagnosis not present

## 2024-04-24 DIAGNOSIS — Z5112 Encounter for antineoplastic immunotherapy: Secondary | ICD-10-CM | POA: Diagnosis not present

## 2024-04-24 DIAGNOSIS — Z510 Encounter for antineoplastic radiation therapy: Secondary | ICD-10-CM | POA: Diagnosis not present

## 2024-04-24 LAB — RAD ONC ARIA SESSION SUMMARY
Course Elapsed Days: 0
Plan Fractions Treated to Date: 1
Plan Prescribed Dose Per Fraction: 2 Gy
Plan Total Fractions Prescribed: 15
Plan Total Prescribed Dose: 30 Gy
Reference Point Dosage Given to Date: 2 Gy
Reference Point Session Dosage Given: 2 Gy
Session Number: 1

## 2024-04-24 LAB — T4: T4, Total: 6.9 ug/dL (ref 4.5–12.0)

## 2024-04-25 ENCOUNTER — Other Ambulatory Visit: Payer: Self-pay

## 2024-04-25 ENCOUNTER — Ambulatory Visit
Admission: RE | Admit: 2024-04-25 | Discharge: 2024-04-25 | Disposition: A | Source: Ambulatory Visit | Attending: Radiation Oncology

## 2024-04-25 DIAGNOSIS — Z5112 Encounter for antineoplastic immunotherapy: Secondary | ICD-10-CM | POA: Diagnosis not present

## 2024-04-25 LAB — RAD ONC ARIA SESSION SUMMARY
Course Elapsed Days: 1
Plan Fractions Treated to Date: 2
Plan Prescribed Dose Per Fraction: 2 Gy
Plan Total Fractions Prescribed: 15
Plan Total Prescribed Dose: 30 Gy
Reference Point Dosage Given to Date: 4 Gy
Reference Point Session Dosage Given: 2 Gy
Session Number: 2

## 2024-04-25 MED ORDER — IPRATROPIUM BROMIDE HFA 17 MCG/ACT IN AERS: 2.0000 | INHALATION_SPRAY | Freq: Four times a day (QID) | RESPIRATORY_TRACT | 1 refills | Status: DC | PRN

## 2024-04-28 ENCOUNTER — Ambulatory Visit
Admission: RE | Admit: 2024-04-28 | Discharge: 2024-04-28 | Disposition: A | Discharge status: Home / Self Care | Source: Ambulatory Visit | Attending: Radiation Oncology | Admitting: Radiation Oncology

## 2024-04-28 ENCOUNTER — Other Ambulatory Visit: Payer: Self-pay

## 2024-04-28 DIAGNOSIS — Z5112 Encounter for antineoplastic immunotherapy: Secondary | ICD-10-CM | POA: Diagnosis not present

## 2024-04-28 DIAGNOSIS — C3411 Malignant neoplasm of upper lobe, right bronchus or lung: Secondary | ICD-10-CM | POA: Diagnosis not present

## 2024-04-28 DIAGNOSIS — M7989 Other specified soft tissue disorders: Secondary | ICD-10-CM | POA: Diagnosis not present

## 2024-04-28 DIAGNOSIS — M542 Cervicalgia: Secondary | ICD-10-CM | POA: Diagnosis not present

## 2024-04-28 DIAGNOSIS — Z79620 Long term (current) use of immunosuppressive biologic: Secondary | ICD-10-CM | POA: Diagnosis not present

## 2024-04-28 DIAGNOSIS — Z510 Encounter for antineoplastic radiation therapy: Secondary | ICD-10-CM | POA: Diagnosis not present

## 2024-04-28 DIAGNOSIS — Z87891 Personal history of nicotine dependence: Secondary | ICD-10-CM | POA: Diagnosis not present

## 2024-04-28 LAB — RAD ONC ARIA SESSION SUMMARY
Course Elapsed Days: 4
Plan Fractions Treated to Date: 3
Plan Prescribed Dose Per Fraction: 2 Gy
Plan Total Fractions Prescribed: 15
Plan Total Prescribed Dose: 30 Gy
Reference Point Dosage Given to Date: 6 Gy
Reference Point Session Dosage Given: 2 Gy
Session Number: 3

## 2024-04-29 ENCOUNTER — Other Ambulatory Visit: Payer: Self-pay

## 2024-04-29 ENCOUNTER — Ambulatory Visit
Admission: RE | Admit: 2024-04-29 | Discharge: 2024-04-29 | Disposition: A | Source: Ambulatory Visit | Attending: Radiation Oncology

## 2024-04-29 ENCOUNTER — Other Ambulatory Visit: Payer: Self-pay | Admitting: Hematology

## 2024-04-29 ENCOUNTER — Ambulatory Visit
Admission: RE | Admit: 2024-04-29 | Discharge: 2024-04-29 | Disposition: A | Discharge status: Home / Self Care | Source: Ambulatory Visit | Attending: Radiation Oncology | Admitting: Radiation Oncology

## 2024-04-29 DIAGNOSIS — Z87891 Personal history of nicotine dependence: Secondary | ICD-10-CM | POA: Diagnosis not present

## 2024-04-29 DIAGNOSIS — Z5112 Encounter for antineoplastic immunotherapy: Secondary | ICD-10-CM | POA: Diagnosis not present

## 2024-04-29 DIAGNOSIS — C3411 Malignant neoplasm of upper lobe, right bronchus or lung: Secondary | ICD-10-CM | POA: Diagnosis not present

## 2024-04-29 DIAGNOSIS — M7989 Other specified soft tissue disorders: Secondary | ICD-10-CM | POA: Diagnosis not present

## 2024-04-29 DIAGNOSIS — Z510 Encounter for antineoplastic radiation therapy: Secondary | ICD-10-CM | POA: Diagnosis not present

## 2024-04-29 LAB — RAD ONC ARIA SESSION SUMMARY
Course Elapsed Days: 5
Plan Fractions Treated to Date: 4
Plan Prescribed Dose Per Fraction: 2 Gy
Plan Total Fractions Prescribed: 15
Plan Total Prescribed Dose: 30 Gy
Reference Point Dosage Given to Date: 8 Gy
Reference Point Session Dosage Given: 2 Gy
Session Number: 4

## 2024-04-30 ENCOUNTER — Encounter: Payer: Self-pay | Admitting: Hematology

## 2024-04-30 ENCOUNTER — Ambulatory Visit (HOSPITAL_COMMUNITY)
Admission: RE | Admit: 2024-04-30 | Discharge: 2024-04-30 | Disposition: A | Discharge status: Home / Self Care | Source: Ambulatory Visit | Attending: Hematology | Admitting: Hematology

## 2024-04-30 ENCOUNTER — Ambulatory Visit
Admission: RE | Admit: 2024-04-30 | Discharge: 2024-04-30 | Disposition: A | Discharge status: Home / Self Care | Source: Ambulatory Visit | Attending: Radiation Oncology | Admitting: Radiation Oncology

## 2024-04-30 ENCOUNTER — Other Ambulatory Visit: Payer: Self-pay

## 2024-04-30 DIAGNOSIS — M7989 Other specified soft tissue disorders: Secondary | ICD-10-CM | POA: Diagnosis not present

## 2024-04-30 DIAGNOSIS — Z85118 Personal history of other malignant neoplasm of bronchus and lung: Secondary | ICD-10-CM | POA: Diagnosis not present

## 2024-04-30 DIAGNOSIS — Z5112 Encounter for antineoplastic immunotherapy: Secondary | ICD-10-CM | POA: Diagnosis not present

## 2024-04-30 DIAGNOSIS — R2242 Localized swelling, mass and lump, left lower limb: Secondary | ICD-10-CM | POA: Diagnosis not present

## 2024-04-30 LAB — RAD ONC ARIA SESSION SUMMARY
Course Elapsed Days: 6
Plan Fractions Treated to Date: 5
Plan Prescribed Dose Per Fraction: 2 Gy
Plan Total Fractions Prescribed: 15
Plan Total Prescribed Dose: 30 Gy
Reference Point Dosage Given to Date: 10 Gy
Reference Point Session Dosage Given: 2 Gy
Session Number: 5

## 2024-05-01 ENCOUNTER — Other Ambulatory Visit: Payer: Self-pay

## 2024-05-01 ENCOUNTER — Ambulatory Visit
Admission: RE | Admit: 2024-05-01 | Discharge: 2024-05-01 | Disposition: A | Discharge status: Home / Self Care | Source: Ambulatory Visit | Attending: Radiation Oncology | Admitting: Radiation Oncology

## 2024-05-01 DIAGNOSIS — Z79620 Long term (current) use of immunosuppressive biologic: Secondary | ICD-10-CM | POA: Diagnosis not present

## 2024-05-01 DIAGNOSIS — Z5112 Encounter for antineoplastic immunotherapy: Secondary | ICD-10-CM | POA: Diagnosis not present

## 2024-05-01 LAB — RAD ONC ARIA SESSION SUMMARY
Course Elapsed Days: 7
Plan Fractions Treated to Date: 6
Plan Prescribed Dose Per Fraction: 2 Gy
Plan Total Fractions Prescribed: 15
Plan Total Prescribed Dose: 30 Gy
Reference Point Dosage Given to Date: 12 Gy
Reference Point Session Dosage Given: 2 Gy
Session Number: 6

## 2024-05-01 LAB — ACID FAST CULTURE WITH REFLEXED SENSITIVITIES (MYCOBACTERIA): Acid Fast Culture: NEGATIVE

## 2024-05-02 ENCOUNTER — Other Ambulatory Visit: Payer: Self-pay

## 2024-05-02 ENCOUNTER — Ambulatory Visit
Admission: RE | Admit: 2024-05-02 | Discharge: 2024-05-02 | Disposition: A | Discharge status: Home / Self Care | Source: Ambulatory Visit | Attending: Radiation Oncology | Admitting: Radiation Oncology

## 2024-05-02 DIAGNOSIS — M7989 Other specified soft tissue disorders: Secondary | ICD-10-CM | POA: Diagnosis not present

## 2024-05-02 DIAGNOSIS — C3411 Malignant neoplasm of upper lobe, right bronchus or lung: Secondary | ICD-10-CM | POA: Diagnosis not present

## 2024-05-02 DIAGNOSIS — Z510 Encounter for antineoplastic radiation therapy: Secondary | ICD-10-CM | POA: Diagnosis not present

## 2024-05-02 DIAGNOSIS — Z5112 Encounter for antineoplastic immunotherapy: Secondary | ICD-10-CM | POA: Diagnosis not present

## 2024-05-02 DIAGNOSIS — Z79620 Long term (current) use of immunosuppressive biologic: Secondary | ICD-10-CM | POA: Diagnosis not present

## 2024-05-02 LAB — RAD ONC ARIA SESSION SUMMARY
Course Elapsed Days: 8
Plan Fractions Treated to Date: 7
Plan Prescribed Dose Per Fraction: 2 Gy
Plan Total Fractions Prescribed: 15
Plan Total Prescribed Dose: 30 Gy
Reference Point Dosage Given to Date: 14 Gy
Reference Point Session Dosage Given: 2 Gy
Session Number: 7

## 2024-05-05 ENCOUNTER — Ambulatory Visit (INDEPENDENT_AMBULATORY_CARE_PROVIDER_SITE_OTHER)
Admission: RE | Admit: 2024-05-05 | Discharge: 2024-05-05 | Disposition: A | Source: Ambulatory Visit | Attending: Radiation Oncology | Admitting: Radiation Oncology

## 2024-05-05 ENCOUNTER — Other Ambulatory Visit: Payer: Self-pay

## 2024-05-05 ENCOUNTER — Ambulatory Visit
Admission: RE | Admit: 2024-05-05 | Discharge: 2024-05-05 | Disposition: A | Discharge status: Home / Self Care | Source: Ambulatory Visit | Attending: Radiation Oncology | Admitting: Radiation Oncology

## 2024-05-05 ENCOUNTER — Ambulatory Visit
Admission: RE | Admit: 2024-05-05 | Discharge: 2024-05-05 | Disposition: A | Source: Ambulatory Visit | Attending: Radiation Oncology

## 2024-05-05 DIAGNOSIS — C3491 Malignant neoplasm of unspecified part of right bronchus or lung: Secondary | ICD-10-CM

## 2024-05-05 DIAGNOSIS — M25511 Pain in right shoulder: Secondary | ICD-10-CM

## 2024-05-05 DIAGNOSIS — M549 Dorsalgia, unspecified: Secondary | ICD-10-CM | POA: Diagnosis not present

## 2024-05-05 DIAGNOSIS — Z6823 Body mass index (BMI) 23.0-23.9, adult: Secondary | ICD-10-CM | POA: Diagnosis not present

## 2024-05-05 DIAGNOSIS — G629 Polyneuropathy, unspecified: Secondary | ICD-10-CM | POA: Diagnosis not present

## 2024-05-05 DIAGNOSIS — M778 Other enthesopathies, not elsewhere classified: Secondary | ICD-10-CM | POA: Diagnosis not present

## 2024-05-05 DIAGNOSIS — M19011 Primary osteoarthritis, right shoulder: Secondary | ICD-10-CM | POA: Diagnosis not present

## 2024-05-05 DIAGNOSIS — Z5112 Encounter for antineoplastic immunotherapy: Secondary | ICD-10-CM | POA: Diagnosis not present

## 2024-05-05 LAB — RAD ONC ARIA SESSION SUMMARY
Course Elapsed Days: 11
Plan Fractions Treated to Date: 8
Plan Prescribed Dose Per Fraction: 2 Gy
Plan Total Fractions Prescribed: 15
Plan Total Prescribed Dose: 30 Gy
Reference Point Dosage Given to Date: 16 Gy
Reference Point Session Dosage Given: 2 Gy
Session Number: 8

## 2024-05-06 ENCOUNTER — Other Ambulatory Visit: Payer: Self-pay

## 2024-05-06 ENCOUNTER — Ambulatory Visit
Admission: RE | Admit: 2024-05-06 | Discharge: 2024-05-06 | Disposition: A | Discharge status: Home / Self Care | Source: Ambulatory Visit | Attending: Radiation Oncology | Admitting: Radiation Oncology

## 2024-05-06 ENCOUNTER — Ambulatory Visit

## 2024-05-06 ENCOUNTER — Ambulatory Visit: Admitting: Radiation Oncology

## 2024-05-06 DIAGNOSIS — C3491 Malignant neoplasm of unspecified part of right bronchus or lung: Secondary | ICD-10-CM

## 2024-05-06 DIAGNOSIS — Z5112 Encounter for antineoplastic immunotherapy: Secondary | ICD-10-CM | POA: Diagnosis not present

## 2024-05-06 LAB — RAD ONC ARIA SESSION SUMMARY
Course Elapsed Days: 12
Plan Fractions Treated to Date: 9
Plan Prescribed Dose Per Fraction: 2 Gy
Plan Total Fractions Prescribed: 15
Plan Total Prescribed Dose: 30 Gy
Reference Point Dosage Given to Date: 18 Gy
Reference Point Session Dosage Given: 2 Gy
Session Number: 9

## 2024-05-07 ENCOUNTER — Inpatient Hospital Stay

## 2024-05-07 ENCOUNTER — Encounter: Payer: Self-pay | Admitting: Hematology

## 2024-05-07 ENCOUNTER — Ambulatory Visit
Admission: RE | Admit: 2024-05-07 | Discharge: 2024-05-07 | Disposition: A | Source: Ambulatory Visit | Attending: Radiation Oncology

## 2024-05-07 ENCOUNTER — Other Ambulatory Visit: Payer: Self-pay

## 2024-05-07 ENCOUNTER — Inpatient Hospital Stay (HOSPITAL_BASED_OUTPATIENT_CLINIC_OR_DEPARTMENT_OTHER): Admitting: Hematology

## 2024-05-07 VITALS — BP 143/70 | HR 75 | Temp 98.1°F | Resp 18 | Wt 147.5 lb

## 2024-05-07 DIAGNOSIS — Z7189 Other specified counseling: Secondary | ICD-10-CM

## 2024-05-07 DIAGNOSIS — M25511 Pain in right shoulder: Secondary | ICD-10-CM | POA: Diagnosis not present

## 2024-05-07 DIAGNOSIS — C3491 Malignant neoplasm of unspecified part of right bronchus or lung: Secondary | ICD-10-CM

## 2024-05-07 DIAGNOSIS — M7989 Other specified soft tissue disorders: Secondary | ICD-10-CM | POA: Diagnosis not present

## 2024-05-07 DIAGNOSIS — Z5112 Encounter for antineoplastic immunotherapy: Secondary | ICD-10-CM | POA: Diagnosis not present

## 2024-05-07 DIAGNOSIS — Z87891 Personal history of nicotine dependence: Secondary | ICD-10-CM | POA: Diagnosis not present

## 2024-05-07 DIAGNOSIS — Z510 Encounter for antineoplastic radiation therapy: Secondary | ICD-10-CM | POA: Diagnosis not present

## 2024-05-07 DIAGNOSIS — C3411 Malignant neoplasm of upper lobe, right bronchus or lung: Secondary | ICD-10-CM | POA: Diagnosis not present

## 2024-05-07 DIAGNOSIS — Z79620 Long term (current) use of immunosuppressive biologic: Secondary | ICD-10-CM | POA: Diagnosis not present

## 2024-05-07 LAB — CMP (CANCER CENTER ONLY)
ALT: 12 U/L (ref 0–44)
AST: 15 U/L (ref 15–41)
Albumin: 4 g/dL (ref 3.5–5.0)
Alkaline Phosphatase: 93 U/L (ref 38–126)
Anion gap: 5 (ref 5–15)
BUN: 12 mg/dL (ref 8–23)
CO2: 29 mmol/L (ref 22–32)
Calcium: 9 mg/dL (ref 8.9–10.3)
Chloride: 107 mmol/L (ref 98–111)
Creatinine: 0.64 mg/dL (ref 0.44–1.00)
GFR, Estimated: >60 mL/min (ref >60)
Glucose, Bld: 100 mg/dL — ABNORMAL HIGH (ref 70–99)
Potassium: 3.4 mmol/L — ABNORMAL LOW (ref 3.5–5.1)
Sodium: 141 mmol/L (ref 135–145)
Total Bilirubin: 0.4 mg/dL (ref 0.0–1.2)
Total Protein: 6.6 g/dL (ref 6.5–8.1)

## 2024-05-07 LAB — RAD ONC ARIA SESSION SUMMARY
Course Elapsed Days: 13
Plan Fractions Treated to Date: 10
Plan Prescribed Dose Per Fraction: 2 Gy
Plan Total Fractions Prescribed: 15
Plan Total Prescribed Dose: 30 Gy
Reference Point Dosage Given to Date: 20 Gy
Reference Point Session Dosage Given: 2 Gy
Session Number: 10

## 2024-05-07 LAB — CBC WITH DIFFERENTIAL (CANCER CENTER ONLY)
Abs Immature Granulocytes: 0.01 K/uL (ref 0.00–0.07)
Basophils Absolute: 0 K/uL (ref 0.0–0.1)
Basophils Relative: 0 %
Eosinophils Absolute: 0.1 K/uL (ref 0.0–0.5)
Eosinophils Relative: 2 %
HCT: 39.5 % (ref 36.0–46.0)
Hemoglobin: 13.7 g/dL (ref 12.0–15.0)
Immature Granulocytes: 0 %
Lymphocytes Relative: 10 %
Lymphs Abs: 0.4 K/uL — ABNORMAL LOW (ref 0.7–4.0)
MCH: 31.6 pg (ref 26.0–34.0)
MCHC: 34.7 g/dL (ref 30.0–36.0)
MCV: 91.2 fL (ref 80.0–100.0)
Monocytes Absolute: 0.4 K/uL (ref 0.1–1.0)
Monocytes Relative: 10 %
Neutro Abs: 3.2 K/uL (ref 1.7–7.7)
Neutrophils Relative %: 78 %
Platelet Count: 117 K/uL — ABNORMAL LOW (ref 150–400)
RBC: 4.33 MIL/uL (ref 3.87–5.11)
RDW: 14 % (ref 11.5–15.5)
WBC Count: 4.1 K/uL (ref 4.0–10.5)
nRBC: 0 % (ref 0.0–0.2)

## 2024-05-07 LAB — TSH: TSH: 1.19 u[IU]/mL (ref 0.350–4.500)

## 2024-05-07 MED ORDER — KETOROLAC TROMETHAMINE 30 MG/ML IJ SOLN
30.0000 mg | Freq: Once | INTRAMUSCULAR | Status: AC
  Administered 2024-05-07: 30 mg via INTRAVENOUS

## 2024-05-07 MED ORDER — SODIUM CHLORIDE 0.9 % IV SOLN
10.0000 mg/kg | Freq: Once | INTRAVENOUS | Status: AC
  Administered 2024-05-07: 740 mg via INTRAVENOUS
  Filled 2024-05-07: qty 4.8

## 2024-05-07 MED ORDER — SODIUM CHLORIDE 0.9% FLUSH
10.0000 mL | INTRAVENOUS | Status: DC | PRN
  Administered 2024-05-07: 10 mL

## 2024-05-07 MED ORDER — HYDROCODONE-ACETAMINOPHEN 5-325 MG PO TABS: 1.0000 | ORAL_TABLET | Freq: Four times a day (QID) | ORAL | 0 refills | Status: DC | PRN

## 2024-05-07 MED ORDER — KETOROLAC TROMETHAMINE 30 MG/ML IJ SOLN
INTRAMUSCULAR | Status: AC
  Filled 2024-05-07: qty 1

## 2024-05-07 MED ORDER — SODIUM CHLORIDE 0.9 % IV SOLN: INTRAVENOUS | Status: DC

## 2024-05-07 NOTE — Progress Notes (Signed)
 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 female who is here for follow-up for continued management of squamous cell carcinoma of the lung.    Last seen by me on 04/23/2024 and at that time endorsed .   Today, she says that tomorrow she will be starting Palliative Radiation tomorrow per Dr. Jomarie. Denies diarrhea, skin rashes, new cough, or hemoptysis.   Does endorse fatigue. Says that she is eating and sleeping, using a Trazodone  to help fall asleep. Also having some rhinorrhea/congestion with a persisting rattle. For management, she is reportedly using antihistamines, but no inhalers. She states that she is not officially diagnosed with COPD.  She does have a concern regarding a new lump on the back of her left leg that she noticed a month ago - initially believed she'd been bitten. Denies falls/injuries or pain.   Additionally reports experiencing pain in her right shoulder and reduced ROM when turning head.    Shoulder still huurts  Radioating hurting due to shoulder  No shob or change in breathing  Some occasional nausea  - Reviewed 05/05/2024   Chronic arthritic changes,  Nothing triggered pain Continuous pain, positional  Has received prednisone  injection, did not help her joint, told to go to PT Going to see Dr. Smith, Neurosurgeon Was not giving a sling for shoulder Currently localized into shoulder, when first began it radiated to spine   Everything hurts somretimes into spine, but usually in right shoulder Pain on movement, driving  is especially difficulty.  Has degenerative discs, which has been suggested to contribute to her shoulder pain.  Using Tylenol  TID for pain, prescribed Methocarbomal  Tramadol  previously has not worked  HAs been advised not to take NSAIDS due to Eliquis   - Offered short-term narcotic  Kidneys are fine.   Denies bleeding issues.     Was not able to use inhaler due to insurance, but cough is improving  Pain in posterior aspect of shoulder Lateral movements painful more so outward.   Pain has not changed since xray  Lesion on top of her head x1 week, not painful unless palpated  - Immunotherapy may worsen pre-existing inflammation  - Could bx left posterior thigh mass  - Spot on head could be a boil, use warm compresses If another subcutaneous metastasis then will need full-body scan. If these are then treatment will have to be reconfigured as immunotherapy and targeted radiation is not controlling condition.  Unusual for SCC, but does has hx of skin cancer removed from ankle but unsure if SCC/BCC   MEDICAL HISTORY:  Past Medical History:  Diagnosis Date   AICD (automatic cardioverter/defibrillator) present dual   Medtronic - original placed 2007, generator change 2014, generator extraction 03/14/2024   Anemia    Anticoagulant long-term use    eliquis    Anxiety    Arthralgia of multiple joints    Arthritis pain    Benign hypertensive heart disease    CAD (coronary artery disease) primary cardiologist-- dr danelle taylor   MI and 2 stents 1995 in [place]    Cancer [facility])    Cataract ?  Surgery 2020   CHF NYHA class II, chronic, systolic (HCC)    followed by dr danelle taylor   Complication of anesthesia    Degenerative disc disease, lumbar    Depression    Difficult intubation    Dyslipidemia    History of basal cell carcinoma (BCC) excision    right ankle area s/p  excision in office 06/ 2019  in office   History of DVT of lower extremity yrs ago before 2012    History of MI (myocardial infarction) 1995  in [place]   History of pulmonary embolus (PE) 2012   History of ventricular tachycardia    Hyperlipidemia    Hypersomnia    Hypertension    Ischemic cardiomyopathy    Migraines    Myocardial infarction (HCC)    OA (osteoarthritis)    all over   PAF (paroxysmal atrial fibrillation) (HCC)    Pneumonia    02/2023   Primary localized osteoarthritis of right hip 12/18/2017   Primary localized osteoarthritis of right knee 05/28/2018   S/P coronary artery stent placement 1995   in [facility]   05-21-2018 per pt x2  stents in same coronary artery (unsure BM or DES)   Vitamin D  deficiency disease     SURGICAL HISTORY: Past Surgical History:  Procedure Laterality Date   BIOPSY  06/19/2022   Procedure: BIOPSY;  Surgeon: Elicia Claw, MD;  Location: [facility] ENDOSCOPY;  Service: Gastroenterology;;   BRONCHIAL BIOPSY  08/07/2023   Procedure: BRONCHIAL BIOPSIES;  Surgeon: Shelah Lamar RAMAN, MD;  Location: [facility] ENDOSCOPY;  Service: Pulmonary;;   BRONCHIAL BRUSHINGS  08/07/2023   Procedure: BRONCHIAL BRUSHINGS;  Surgeon: Shelah Lamar RAMAN, MD;  Location: [facility] ENDOSCOPY;  Service: Pulmonary;;   BRONCHIAL BRUSHINGS  03/17/2024   Procedure: BRONCHOSCOPY, WITH BRUSH BIOPSY;  Surgeon: Shelah Lamar RAMAN, MD;  Location: [facility] ENDOSCOPY;  Service: Pulmonary;;   BRONCHIAL NEEDLE ASPIRATION BIOPSY  08/07/2023   Procedure: BRONCHIAL NEEDLE ASPIRATION BIOPSIES;  Surgeon: Shelah Lamar RAMAN, MD;  Location: [facility] ENDOSCOPY;  Service: Pulmonary;;   BRONCHIAL NEEDLE ASPIRATION BIOPSY  03/17/2024   Procedure: BRONCHOSCOPY, WITH NEEDLE ASPIRATION BIOPSY;  Surgeon: Shelah Lamar RAMAN, MD;  Location: [facility] ENDOSCOPY;  Service: Pulmonary;;   CARDIAC DEFIBRILLATOR PLACEMENT  2007   CARPAL TUNNEL RELEASE Right 02/26/2024   Procedure: CARPAL TUNNEL RELEASE;  Surgeon: Josefina Chew, MD;  Location: [facility] OR;  Service: Orthopedics;  Laterality: Right;   CATARACT EXTRACTION, BILATERAL     COLONOSCOPY  04/14/2013    colonic polyp, status post polypectomy. Mild panocolonic diverticulosis. Small internal hemorrhoids   CRYOTHERAPY  03/17/2024   Procedure: CRYOTHERAPY;  Surgeon: Shelah Lamar RAMAN, MD;  Location: [facility] ENDOSCOPY;  Service: Pulmonary;;   DILATION AND CURETTAGE OF UTERUS  yrs ago   DIRECT LARYNGOSCOPY Right 10/04/2022   Procedure: DIRECT LARYNGOSCOPY;  Surgeon: Llewellyn Gerard LABOR, DO;  Location: [facility] OR;  Service: ENT;  Laterality: Right;   ENDOBRONCHIAL ULTRASOUND Bilateral 03/17/2024   Procedure: ENDOBRONCHIAL ULTRASOUND (EBUS);  Surgeon: Shelah Lamar RAMAN, MD;  Location: [facility] ENDOSCOPY;  Service: Pulmonary;  Laterality: Bilateral;   ESOPHAGOGASTRODUODENOSCOPY (EGD) WITH PROPOFOL  N/A 06/19/2022   Procedure: ESOPHAGOGASTRODUODENOSCOPY (EGD) WITH PROPOFOL ;  Surgeon: Elicia Claw, MD;  Location: [facility] ENDOSCOPY;  Service: Gastroenterology;  Laterality: N/A;   EYE SURGERY  2020   Cataracts removed   FIDUCIAL MARKER PLACEMENT  08/07/2023   Procedure: FIDUCIAL MARKER PLACEMENT;  Surgeon: Shelah Lamar RAMAN, MD;  Location: [facility] ENDOSCOPY;  Service: Pulmonary;;   ICD GENERATOR REMOVAL N/A 03/14/2024  Procedure: ICD GENERATOR REMOVAL;  Surgeon: Waddell Danelle ORN, MD;  [facility] INVASIVE CV LAB;  Service: Cardiovascular;  Laterality: N/A;   IMPACTION REMOVAL  06/19/2022   Procedure: IMPACTION REMOVAL;  Surgeon: Elicia Claw, MD;  Location: [facility] ENDOSCOPY;  Service: Gastroenterology;;   IMPLANTABLE CARDIOVERTER DEFIBRILLATOR GENERATOR CHANGE N/A 02/04/2013   Procedure: IMPLANTABLE CARDIOVERTER DEFIBRILLATOR GENERATOR CHANGE;  Surgeon: Danelle ORN Waddell, MD;  Location: [facility] CATH LAB;  Service: Cardiovascular;  Laterality: N/A;   IR IMAGING GUIDED PORT INSERTION  10/27/2022   JOINT REPLACEMENT  2019, 2022, 2023   Both hips, both knees   LYMPH NODE BIOPSY Right 10/04/2022   Procedure: EXCISIONAL OF RIGHT DEEP CERVICAL LYMPH NODE;  Surgeon: Llewellyn Gerard LABOR, DO;  Location: [facility] OR;  Service: ENT;  Laterality: Right;    SHOULDER ARTHROSCOPY Right 2015   TOTAL HIP ARTHROPLASTY Right 12/18/2017   Procedure: RIGHT TOTAL HIP ARTHROPLASTY;  Surgeon: Josefina Chew, MD;  Location: [facility] OR;  Service: Orthopedics;  Laterality: Right;   TOTAL HIP ARTHROPLASTY Left 04/19/2021   Procedure: TOTAL HIP ARTHROPLASTY;  Surgeon: Josefina Chew, MD;  Location: [facility] ORS;  Service: Orthopedics;  Laterality: Left;   TOTAL KNEE ARTHROPLASTY Right 05/28/2018   Procedure: TOTAL KNEE ARTHROPLASTY;  Surgeon: Josefina Chew, MD;  Location: [facility] ORS;  Service: Orthopedics;  Laterality: Right;  Adductor Block   TOTAL KNEE ARTHROPLASTY Left 08/12/2021   Procedure: TOTAL KNEE ARTHROPLASTY;  Surgeon: Josefina Chew, MD;  Location: [facility] ORS;  Service: Orthopedics;  Laterality: Left;   TUBAL LIGATION Bilateral yrs ago   VIDEO BRONCHOSCOPY WITH ENDOBRONCHIAL NAVIGATION Bilateral 03/17/2024   Procedure: VIDEO BRONCHOSCOPY WITH ENDOBRONCHIAL NAVIGATION;  Surgeon: Shelah Lamar RAMAN, MD;  Location: [facility] ENDOSCOPY;  Service: Pulmonary;  Laterality: Bilateral;  need EBUS scope also   VIDEO BRONCHOSCOPY WITH RADIAL ENDOBRONCHIAL ULTRASOUND  08/07/2023   Procedure: VIDEO BRONCHOSCOPY WITH RADIAL ENDOBRONCHIAL ULTRASOUND;  Surgeon: Shelah Lamar RAMAN, MD;  Location: [facility] ENDOSCOPY;  Service: Pulmonary;;   WISDOM TOOTH EXTRACTION      SOCIAL HISTORY: Social History   Socioeconomic History   Marital status: Single    Spouse name: Not on file   Number of children: 0   Years of education: 14   Highest education level: Not on file  Occupational History   Not on file  Tobacco Use   Smoking status: Former    Current packs/day: 0.00    Types: Cigarettes    Start date: 05/21/1966    Quit date: 05/21/1996    Years since quitting: 27.9   Smokeless tobacco: Never  Vaping Use   Vaping status: Never Used  Substance and Sexual Activity   Alcohol use: No   Drug use: Never   Sexual activity: Not on file  Other Topics Concern   Not on file  Social History Narrative    Not on file   Social Drivers of Health   Financial Resource Strain: Low Risk  (05/28/2018)   Overall Financial Resource Strain (CARDIA)    Difficulty of Paying Living Expenses: Not hard at all  Food Insecurity: No Food Insecurity (08/28/2023)   Hunger Vital Sign    Worried About Running Out of Food in the Last Year: Never true    Ran Out of Food in the Last Year: Never true  Transportation Needs: No Transportation Needs (08/28/2023)   PRAPARE - Administrator, Civil Service (Medical): No    Lack of Transportation (Non-Medical): No  Physical Activity: Not on file  Stress: Not on file  Social Connections: Not on file  Intimate Partner Violence: Not At Risk (08/28/2023)   Humiliation, Afraid, Rape, and Kick questionnaire    Fear of Current or Ex-Partner: No    Emotionally Abused: No    Physically Abused: No    Sexually Abused: No    FAMILY HISTORY: Family History  Problem Relation Age of Onset   Hypertension Mother    Thyroid  disease Mother    Alzheimer's disease Mother    Hearing loss Mother    Coronary artery disease Father    Pulmonary embolism Father    Arthritis Father    Heart disease Father    Congestive Heart Failure Maternal Grandmother    Hypertension Maternal Grandmother    Heart attack Maternal Grandfather    Other Maternal Grandfather        carotid disease   Dementia Paternal Grandmother    Other Paternal Grandfather 60       accident   Cancer Sister     ALLERGIES:  is allergic to sumatriptan succinate.  MEDICATIONS:  Current Outpatient Medications  Medication Sig Dispense Refill   apixaban  (ELIQUIS ) 5 MG TABS tablet Take 1 tablet (5 mg total) by mouth 2 (two) times daily. 60 tablet 5   b complex vitamins capsule Take 1 capsule by mouth daily.     Calcium  Carb-Cholecalciferol  (CALCIUM  600 + D PO) Take 1 tablet by mouth daily.     carvedilol  (COREG ) 12.5 MG tablet Take 12.5 mg by mouth 2 (two) times daily with a meal.      Cholecalciferol   (VITAMIN D3) 125 MCG (5000 UT) capsule Take 5,000 Units by mouth daily.     cyanocobalamin  (VITAMIN B12) 1000 MCG tablet Take 1,000 mcg by mouth daily.     FIBER PO Take 1 capsule by mouth 2 (two) times daily.     gabapentin  (NEURONTIN ) 300 MG capsule Take 300 mg by mouth 3 (three) times daily.     ipratropium (ATROVENT  HFA) 17 MCG/ACT inhaler Inhale 2 puffs into the lungs every 6 (six) hours as needed for wheezing. 1 each 1   loratadine  (CLARITIN ) 10 MG tablet Take 10 mg by mouth daily.     Omega-3 Fatty Acids (FISH OIL) 1000 MG CAPS Take 1,000 mg by mouth daily.     prochlorperazine  (COMPAZINE ) 10 MG tablet Take 1 tablet (10 mg total) by mouth every 6 (six) hours as needed for nausea or vomiting. 30 tablet 1   ramipril  (ALTACE ) 2.5 MG tablet Take 2.5 mg by mouth daily.       simvastatin  (ZOCOR ) 40 MG tablet Take 40 mg by mouth at bedtime.       tiZANidine  (ZANAFLEX ) 4 MG tablet Take 2 mg by mouth every 8 (eight) hours as needed for muscle spasms.     traZODone  (DESYREL ) 50 MG tablet Take 50 mg by mouth at bedtime.      venlafaxine  (EFFEXOR ) 75 MG tablet Take 75 mg by mouth daily.     vitamin E  400 UNIT capsule Take 400 Units by mouth daily.     zonisamide  (ZONEGRAN ) 50 MG capsule Take 50-100 mg by mouth See admin instructions. Take 50mg  by mouth in the morning and 100mg  at night.     No current facility-administered medications for this visit.   Facility-Administered Medications Ordered in Other Visits  Medication Dose Route Frequency Provider Last Rate Last Admin   0.9 %  sodium chloride  infusion   Intravenous Continuous Amerigo Mcglory Kishore, MD 10 mL/hr at 05/07/24 1536 New  Bag at 05/07/24 1536    REVIEW OF SYSTEMS:   .10 Point review of Systems was done is negative except as noted above.   PHYSICAL EXAMINATION: VSS . GENERAL:alert, in no acute distress and comfortable SKIN: no acute rashes, no significant lesions EYES: conjunctiva are pink and non-injected, sclera  anicteric OROPHARYNX: MMM, no exudates, no oropharyngeal erythema or ulceration NECK: supple, no JVD LYMPH:  no palpable lymphadenopathy in the cervical, axillary or inguinal regions LUNGS: clear to auscultation b/l with normal respiratory effort HEART: regular rate & rhythm ABDOMEN:  normoactive bowel sounds , non tender, not distended. Extremity: no pedal edema (+) Firm mass on left posterior thigh near surgical scar PSYCH: alert & oriented x 3 with fluent speech NEURO: no focal motor/sensory deficits   LABORATORY DATA:  I have reviewed the data as listed    Latest Ref Rng & Units 05/07/2024    1:37 PM 04/23/2024   10:52 AM 04/09/2024   12:27 PM  CBC EXTENDED  WBC 4.0 - 10.5 K/uL 4.1  4.1  4.2   RBC 3.87 - 5.11 MIL/uL 4.33  4.57  4.43   Hemoglobin 12.0 - 15.0 g/dL 86.2  85.7  86.1   HCT 36.0 - 46.0 % 39.5  41.3  40.1   Platelets 150 - 400 K/uL 117  96  92   NEUT# 1.7 - 7.7 K/uL 3.2  3.0  2.9   Lymph# 0.7 - 4.0 K/uL 0.4  0.5  0.6       Latest Ref Rng & Units 05/07/2024    1:37 PM 04/23/2024   10:52 AM 04/09/2024   12:27 PM  CMP  Glucose 70 - 99 mg/dL 899  93  869   BUN 8 - 23 mg/dL 12  11  16    Creatinine 0.44 - 1.00 mg/dL 9.35  9.34  9.26   Sodium 135 - 145 mmol/L 141  139  138   Potassium 3.5 - 5.1 mmol/L 3.4  3.8  3.7   Chloride 98 - 111 mmol/L 107  106  106   CO2 22 - 32 mmol/L 29  28  26    Calcium  8.9 - 10.3 mg/dL 9.0  9.8  9.2   Total Protein 6.5 - 8.1 g/dL 6.6  6.5  6.5   Total Bilirubin 0.0 - 1.2 mg/dL 0.4  0.5  0.4   Alkaline Phos 38 - 126 U/L 93  94  93   AST 15 - 41 U/L 15  16  16    ALT 0 - 44 U/L 12  12  10     TSH & TOTAL T4: Lab Results  Component Value Date   TSH 2.840 04/23/2024   T4TOTAL 6.9 04/23/2024   Molecular Pathology 10/21/2022:   Surgical Pathology 10/04/22:  A. LYMPH NODE, RIGHT LEVEL 2 DEEP CERVICAL, EXCISION:  -Diffuse large B-cell lymphoma  -See comment COMMENT: The sections show diffuse effacement of the lymph nodal  architecture primarily by a population of large lymphoid cells characterized by vesicular chromatin and small nucleoli associated with apoptosis and brisk mitosis.  In some areas, the large atypical lymphoid proliferation extends into the perinodal adipose tissue.  In this background, there are scattered variably sized and somewhat disrupted aggregates of primarily small lymphoid cells characterized by high nuclear cytoplasmic ratio, angulated nuclear contours and small to inconspicuous nucleoli. Flow cytometric analysis was performed [facility] [id number]) and shows a monoclonal, lambda restricted B-cell population expressing CD10.  In addition, immunohistochemical stains for CD3, CD5, CD10, CD20, PAX5, BCL6,  Bcl-2, Ki-67, CD30, CD138, CD21, EBV in addition to in situ hybridization for kappa and lambda were performed with appropriate controls.  The large lymphoid cells are positive for CD20, PAX5, CD10, BCL6, Bcl-2, and partially for cytoplasmic lambda.  No significant staining is seen with EBV, CD30, CD138 or cytoplasmic kappa.  Ki67 shows variably increased expression (more than 50% in some areas). CD21 highlights scattered somewhat disrupted follicular dendritic networks. The lymphoid aggregates of primarily small lymphoid cells previously described show positivity for B-cell markers CD20 and PAX5 in addition to CD10 and Bcl-2.  There is an admixed variable T-cell component in the background as seen with CD3 and CD5 and there is no apparent co-expression of CD5 in B-cell areas.  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    03/17/2024 CYTOLOGY - NON PAP   Non-Gynecological Cytology Report   Clinical History: Bilateral pulm nodules, mediastinal adenopathy, Hx non  small cell lung ca.   FINAL MICROSCOPIC DIAGNOSIS:  C. LUNG, LUL TARGET #3, FINE NEEDLE  ASPIRATION:  - No malignant cells identified    03/17/2024 CYTOLOGY - NON PAP   Non-Gynecological Cytology Report   Clinical History: Bilateral pulm nodules, mediastinal adenopathy, Hx non  small cell lung ca.   FINAL MICROSCOPIC DIAGNOSIS:  A. LUNG, RUL TARGET #2, BRUSHING:  - No malignant cells identified.  - Benign bronchial cells.   B. LUNG, RUL TARGET #2, FINE NEEDLE ASPIRATION:  - Positive for malignancy. Squamous cell carcinoma (see comment).   Comment: This case was seen in peer review on 03/19/2024 by Dr. Janel,  who concurs with the interpretation.  This result was reported to Dr. Shelah on 03/19/2024.    04/22/2024 MOLECULAR PATHOLOGY   RADIOGRAPHIC STUDIES: I have personally reviewed the radiological images as listed and agreed with the findings in the report. DG Shoulder Right Result Date: 05/06/2024 EXAM: 1 VIEW XRAY OF THE RIGHT SHOULDER 05/05/2024 10:36:54 AM COMPARISON: None available. CLINICAL HISTORY: Shoulder pain for 2 weeks. History of squamous cell lung cancer. X-rays ordered to rule out metastases. FINDINGS: BONES AND JOINTS: Glenohumeral joint is normally aligned. No acute fracture or dislocation. Degenerative subcortical cysts are observed in the humeral head and greater tuberosity region laterally. Moderate glenohumeral and acromioclavicular joint osteoarthritis with associated spurring. Narrowing of the subacromial space consistent with chronic rotator cuff disease. Type 2 subacromial morphology (curved). SOFT TISSUES: Right chest power injectable port in place; tip projects over the right atrium. AICD leads noted. Bandlike opacity in the right upper lung may reflect posttreatment changes. Small adjacent fiducial marker noted. Atheromatous vascular calcification of the aortic arch. IMPRESSION: 1. No acute findings. 2. Moderate glenohumeral joint osteoarthritis with associated spurring. 3. Moderate acromioclavicular joint osteoarthritis with associated spurring.  4. Narrowing of the subacromial space consistent with chronic rotator cuff disease. 5. Degenerative subcortical cysts in the humeral head and greater tuberosity region laterally. 6. Atherosclerosis. 7. Chronic scarring in the upper lung. Electronically signed by: Ryan Salvage MD 05/06/2024 02:20 PM EDT RP Workstation: [id number]   US  LT LOWER EXTREM LTD SOFT TISSUE NON VASCULAR Result Date: 04/30/2024 CLINICAL DATA:  History of metastatic non-small cell lung cancer. Concern for left posterior thigh mass. EXAM: ULTRASOUND left LOWER EXTREMITY LIMITED TECHNIQUE: Ultrasound examination of the lower extremity soft tissues was performed in the area of clinical concern. COMPARISON:  Ultrasound dated 11/21/2010. FINDINGS: Targeted sonographic images of the soft tissues  of the posterior left thigh performed. There is a 2.1 x 1.7 x 1.9 cm hypoechoic mass with irregular margins in the soft tissues of the posterior left thigh suspicious for malignancy or metastatic disease. IMPRESSION: Soft tissue mass in the posterior left thigh concerning for metastatic disease. Electronically Signed   By: Vanetta Chou M.D.   On: 04/30/2024 14:28    ASSESSMENT & PLAN:   Rachel Bates is 78 y.o.  female who presents for a follow up for continued management of squamous cell carcinoma of the lung.   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 Completed radiation therapy on 11/02/2023.  --Started Cycle 1, Day 1 of Durvalumab  on 09/18/2023.   --CT Head from 12/31/2023 showed no intracranial metastatic disease --PET scan from 12/31/2023 showed previous RUL perihilar mass and previously seen nodes in the right hilum in the mid mediastinum appears smaller and shows less uptake. There are new hypermetabolic nodes more superiorly in the right side of the mediastinum along with LUL nodular area with more uptake.  --CT chest from 02/13/2024  continued decreased in size of treated mass in the anterior perihilar RUL.Interval increase in size of nodules throughout the right apex.Unchanged clustered nodules in the peripheral posterior left upper lobe.Interval enlargement of high right pretracheal, paratracheal, and supraclavicular nodes, previously FDG avid, consistent with worsened nodal metastatic disease. 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   PLAN:  - given Toradol  x 1 dose for rt shoulder pain.  - Discussed results of 04/22/2024 molecular pathology.  She will be starting Palliative radiation with Dr. Jomarie tomorrow. We will plan to continue Durvalumab  at this time.  - For her persisting rattle, I am prescribing an Atrovent  inhaler.  - Ordering bx of left posterior thigh mass  - Informed her that reduced ROM is contributed by arthritis and tight muscles, and the tight muscle is what is causing her shoulder pain.  -given suspected soft tissue mets on thigh and possible scalp concerns for disease progression. Will rpt CT CAP  Follow-up Continue durvalumab  as per integrated scheduling US  guided core needle biopsy of left posterior thigh mass CT chest/abd/pelvis in 1-2 weeks  .The total time spent in the appointment was 30 minutes* .  All of the patient's questions were answered with apparent satisfaction. The patient knows to call the clinic with any problems, questions or concerns.   Emaline Saran MD MS AAHIVMS [facility] Hematology/Oncology Physician [facility]  .*Total Encounter Time as defined by the Centers for Medicare and Medicaid Services includes, in addition to the face-to-face time of a patient visit (documented in the note above) non-face-to-face time: obtaining and reviewing outside history, ordering and reviewing medications, tests or procedures, care coordination (communications with other health care professionals or caregivers) and documentation in the medical record.  I,  Damien Lagle,acting as a scribe for Emaline Saran, MD.,have documented all relevant documentation on the behalf of Emaline Saran, MD,as directed by  Emaline Saran, MD while in the presence of Emaline Saran, MD.  I have reviewed the above  documentation for accuracy and completeness, and I agree with the above. Emaline Candida Saran MD.

## 2024-05-07 NOTE — Patient Instructions (Signed)

## 2024-05-07 NOTE — Progress Notes (Signed)
 Pt reports pain down from 9 to 4 before leaving. VSS .

## 2024-05-08 ENCOUNTER — Other Ambulatory Visit: Payer: Self-pay

## 2024-05-08 ENCOUNTER — Ambulatory Visit
Admission: RE | Admit: 2024-05-08 | Discharge: 2024-05-08 | Disposition: A | Discharge status: Home / Self Care | Source: Ambulatory Visit | Attending: Radiation Oncology | Admitting: Radiation Oncology

## 2024-05-08 DIAGNOSIS — C3411 Malignant neoplasm of upper lobe, right bronchus or lung: Secondary | ICD-10-CM | POA: Diagnosis not present

## 2024-05-08 DIAGNOSIS — Z5112 Encounter for antineoplastic immunotherapy: Secondary | ICD-10-CM | POA: Diagnosis not present

## 2024-05-08 LAB — RAD ONC ARIA SESSION SUMMARY
Course Elapsed Days: 14
Plan Fractions Treated to Date: 11
Plan Prescribed Dose Per Fraction: 2 Gy
Plan Total Fractions Prescribed: 15
Plan Total Prescribed Dose: 30 Gy
Reference Point Dosage Given to Date: 22 Gy
Reference Point Session Dosage Given: 2 Gy
Session Number: 11

## 2024-05-08 LAB — T4: T4, Total: 7.3 ug/dL (ref 4.5–12.0)

## 2024-05-09 ENCOUNTER — Other Ambulatory Visit: Payer: Self-pay

## 2024-05-09 ENCOUNTER — Ambulatory Visit
Admission: RE | Admit: 2024-05-09 | Discharge: 2024-05-09 | Disposition: A | Discharge status: Home / Self Care | Source: Ambulatory Visit | Attending: Radiation Oncology | Admitting: Radiation Oncology

## 2024-05-09 DIAGNOSIS — Z5112 Encounter for antineoplastic immunotherapy: Secondary | ICD-10-CM | POA: Diagnosis not present

## 2024-05-09 DIAGNOSIS — M7989 Other specified soft tissue disorders: Secondary | ICD-10-CM | POA: Diagnosis not present

## 2024-05-09 LAB — RAD ONC ARIA SESSION SUMMARY
Course Elapsed Days: 15
Plan Fractions Treated to Date: 12
Plan Prescribed Dose Per Fraction: 2 Gy
Plan Total Fractions Prescribed: 15
Plan Total Prescribed Dose: 30 Gy
Reference Point Dosage Given to Date: 24 Gy
Reference Point Session Dosage Given: 2 Gy
Session Number: 12

## 2024-05-12 ENCOUNTER — Ambulatory Visit
Admission: RE | Admit: 2024-05-12 | Discharge: 2024-05-12 | Disposition: A | Discharge status: Home / Self Care | Source: Ambulatory Visit | Attending: Radiation Oncology | Admitting: Radiation Oncology

## 2024-05-12 ENCOUNTER — Other Ambulatory Visit: Payer: Self-pay

## 2024-05-12 ENCOUNTER — Encounter (HOSPITAL_COMMUNITY): Payer: Self-pay

## 2024-05-12 DIAGNOSIS — Z510 Encounter for antineoplastic radiation therapy: Secondary | ICD-10-CM | POA: Insufficient documentation

## 2024-05-12 DIAGNOSIS — Z87891 Personal history of nicotine dependence: Secondary | ICD-10-CM | POA: Diagnosis not present

## 2024-05-12 DIAGNOSIS — C3411 Malignant neoplasm of upper lobe, right bronchus or lung: Secondary | ICD-10-CM | POA: Insufficient documentation

## 2024-05-12 LAB — RAD ONC ARIA SESSION SUMMARY
Course Elapsed Days: 18
Plan Fractions Treated to Date: 13
Plan Prescribed Dose Per Fraction: 2 Gy
Plan Total Fractions Prescribed: 15
Plan Total Prescribed Dose: 30 Gy
Reference Point Dosage Given to Date: 26 Gy
Reference Point Session Dosage Given: 2 Gy
Session Number: 13

## 2024-05-12 NOTE — Progress Notes (Signed)
 Rachel Simmonds, MD  Rachel Bates PROCEDURE / BIOPSY REVIEW Date: 05/09/24  Requested Biopsy site: L posterior thigh Reason for request: Mass Imaging review: Best seen on US  LLE  Decision: Approved Imaging modality to perform: Ultrasound Schedule with: No sedation / Local anesthetic Schedule for: Any VIR  Additional comments: @VIR : 2 cm L posterior thigh SQ mass @Schedulers . US  L posterior thigh SQ mass Bx. Local anesthetic.  Please contact me with questions, concerns, or if issue pertaining to this request arise.  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

## 2024-05-13 ENCOUNTER — Ambulatory Visit (HOSPITAL_COMMUNITY)

## 2024-05-13 ENCOUNTER — Other Ambulatory Visit: Payer: Self-pay

## 2024-05-13 ENCOUNTER — Ambulatory Visit
Admission: RE | Admit: 2024-05-13 | Discharge: 2024-05-13 | Disposition: A | Discharge status: Home / Self Care | Source: Ambulatory Visit | Attending: Radiation Oncology | Admitting: Radiation Oncology

## 2024-05-13 ENCOUNTER — Ambulatory Visit
Admission: RE | Admit: 2024-05-13 | Discharge: 2024-05-13 | Disposition: A | Source: Ambulatory Visit | Attending: Radiation Oncology | Admitting: Radiation Oncology

## 2024-05-13 DIAGNOSIS — Z510 Encounter for antineoplastic radiation therapy: Secondary | ICD-10-CM | POA: Diagnosis not present

## 2024-05-13 LAB — RAD ONC ARIA SESSION SUMMARY
Course Elapsed Days: 19
Plan Fractions Treated to Date: 14
Plan Prescribed Dose Per Fraction: 2 Gy
Plan Total Fractions Prescribed: 15
Plan Total Prescribed Dose: 30 Gy
Reference Point Dosage Given to Date: 28 Gy
Reference Point Session Dosage Given: 2 Gy
Session Number: 14

## 2024-05-14 ENCOUNTER — Ambulatory Visit

## 2024-05-15 ENCOUNTER — Ambulatory Visit

## 2024-05-16 ENCOUNTER — Ambulatory Visit
Admission: RE | Admit: 2024-05-16 | Discharge: 2024-05-16 | Disposition: A | Discharge status: Home / Self Care | Source: Ambulatory Visit | Attending: Radiation Oncology | Admitting: Radiation Oncology

## 2024-05-16 ENCOUNTER — Other Ambulatory Visit: Payer: Self-pay

## 2024-05-16 DIAGNOSIS — Z510 Encounter for antineoplastic radiation therapy: Secondary | ICD-10-CM | POA: Diagnosis not present

## 2024-05-16 LAB — RAD ONC ARIA SESSION SUMMARY
Course Elapsed Days: 22
Plan Fractions Treated to Date: 15
Plan Prescribed Dose Per Fraction: 2 Gy
Plan Total Fractions Prescribed: 15
Plan Total Prescribed Dose: 30 Gy
Reference Point Dosage Given to Date: 30 Gy
Reference Point Session Dosage Given: 2 Gy
Session Number: 15

## 2024-05-19 DIAGNOSIS — M25511 Pain in right shoulder: Secondary | ICD-10-CM | POA: Diagnosis not present

## 2024-05-19 DIAGNOSIS — M542 Cervicalgia: Secondary | ICD-10-CM | POA: Diagnosis not present

## 2024-05-19 DIAGNOSIS — M5030 Other cervical disc degeneration, unspecified cervical region: Secondary | ICD-10-CM | POA: Diagnosis not present

## 2024-05-19 DIAGNOSIS — M25611 Stiffness of right shoulder, not elsewhere classified: Secondary | ICD-10-CM | POA: Diagnosis not present

## 2024-05-19 DIAGNOSIS — M47812 Spondylosis without myelopathy or radiculopathy, cervical region: Secondary | ICD-10-CM | POA: Diagnosis not present

## 2024-05-19 NOTE — Radiation Completion Notes (Addendum)
 Patient Name: Rachel Bates, Rachel Bates MRN: [id number] Date of Birth: [birth year] Referring Physician: GLENIS BRIDGE, M.D. Date of Service: 2024-05-19 Radiation Oncologist: Lynwood Cedar, M.D. 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, M=cM0 Intent: Curative     ==========DELIVERED PLANS==========  First Treatment Date: 2024-04-24 Last Treatment Date: 2024-05-16   Plan Name: Chest_R Site: Lung, Right Technique: IMRT Mode: Photon Dose Per Fraction: 2 Gy Prescribed Dose (Delivered / Prescribed): 30 Gy / 30 Gy Prescribed Fxs (Delivered / Prescribed): 15 / 15     ==========ON TREATMENT VISIT DATES========== 2024-04-29, 2024-05-05, 2024-05-16     ==========UPCOMING VISITS========== 06/18/2024 [facility] OFF[facility] VISIT Waddell Danelle ORN, MD  06/06/2024 [facility]-ULTRASOUND US  BIOPSY (SOFT TISSUE) [facility]-US  2  06/04/2024 [facility] INFUSION 2HR30MIN (150) [facility] INFUSION  06/04/2024 [facility] EST PT 30 Onesimo Emaline Brink, MD  06/04/2024 [facility] PORT FLUSH W/LAB [facility] FLUSH  05/21/2024 [facility] EST PT 20 Neomi Lis T, PA-C  05/21/2024 [facility] PORT FLUSH W/LAB [facility] FLUSH  05/21/2024 [facility] INFUSION 2HR30MIN (150) [facility] INFUSION        ==========APPENDIX - ON TREATMENT VISIT NOTES==========   See weekly On Treatment Notes in Epic for details in the Media tab (listed as Progress notes on the On Treatment Visit Dates listed above).

## 2024-05-20 DIAGNOSIS — M19011 Primary osteoarthritis, right shoulder: Secondary | ICD-10-CM | POA: Diagnosis not present

## 2024-05-21 ENCOUNTER — Encounter: Payer: Self-pay | Admitting: Hematology

## 2024-05-21 ENCOUNTER — Inpatient Hospital Stay

## 2024-05-21 ENCOUNTER — Inpatient Hospital Stay (HOSPITAL_BASED_OUTPATIENT_CLINIC_OR_DEPARTMENT_OTHER): Admitting: Physician Assistant

## 2024-05-21 ENCOUNTER — Inpatient Hospital Stay: Attending: Hematology

## 2024-05-21 VITALS — BP 123/70 | HR 77 | Temp 97.5°F | Resp 18 | Ht 67.0 in | Wt 144.7 lb

## 2024-05-21 DIAGNOSIS — M25611 Stiffness of right shoulder, not elsewhere classified: Secondary | ICD-10-CM | POA: Diagnosis not present

## 2024-05-21 DIAGNOSIS — Z87891 Personal history of nicotine dependence: Secondary | ICD-10-CM | POA: Diagnosis not present

## 2024-05-21 DIAGNOSIS — Z5112 Encounter for antineoplastic immunotherapy: Secondary | ICD-10-CM | POA: Diagnosis not present

## 2024-05-21 DIAGNOSIS — C3411 Malignant neoplasm of upper lobe, right bronchus or lung: Secondary | ICD-10-CM | POA: Insufficient documentation

## 2024-05-21 DIAGNOSIS — Z79620 Long term (current) use of immunosuppressive biologic: Secondary | ICD-10-CM | POA: Diagnosis not present

## 2024-05-21 DIAGNOSIS — C3491 Malignant neoplasm of unspecified part of right bronchus or lung: Secondary | ICD-10-CM

## 2024-05-21 DIAGNOSIS — M25511 Pain in right shoulder: Secondary | ICD-10-CM | POA: Diagnosis not present

## 2024-05-21 DIAGNOSIS — Z7189 Other specified counseling: Secondary | ICD-10-CM

## 2024-05-21 DIAGNOSIS — M542 Cervicalgia: Secondary | ICD-10-CM | POA: Diagnosis not present

## 2024-05-21 LAB — CBC WITH DIFFERENTIAL (CANCER CENTER ONLY)
Abs Immature Granulocytes: 0.02 K/uL (ref 0.00–0.07)
Basophils Absolute: 0 K/uL (ref 0.0–0.1)
Basophils Relative: 0 %
Eosinophils Absolute: 0 K/uL (ref 0.0–0.5)
Eosinophils Relative: 0 %
HCT: 39.8 % (ref 36.0–46.0)
Hemoglobin: 13.5 g/dL (ref 12.0–15.0)
Immature Granulocytes: 0 %
Lymphocytes Relative: 5 %
Lymphs Abs: 0.3 K/uL — ABNORMAL LOW (ref 0.7–4.0)
MCH: 30.9 pg (ref 26.0–34.0)
MCHC: 33.9 g/dL (ref 30.0–36.0)
MCV: 91.1 fL (ref 80.0–100.0)
Monocytes Absolute: 0.3 K/uL (ref 0.1–1.0)
Monocytes Relative: 7 %
Neutro Abs: 4.4 K/uL (ref 1.7–7.7)
Neutrophils Relative %: 88 %
Platelet Count: 113 K/uL — ABNORMAL LOW (ref 150–400)
RBC: 4.37 MIL/uL (ref 3.87–5.11)
RDW: 13.8 % (ref 11.5–15.5)
WBC Count: 5 K/uL (ref 4.0–10.5)
nRBC: 0 % (ref 0.0–0.2)

## 2024-05-21 LAB — CMP (CANCER CENTER ONLY)
ALT: 11 U/L (ref 0–44)
AST: 14 U/L — ABNORMAL LOW (ref 15–41)
Albumin: 4 g/dL (ref 3.5–5.0)
Alkaline Phosphatase: 87 U/L (ref 38–126)
Anion gap: 6 (ref 5–15)
BUN: 13 mg/dL (ref 8–23)
CO2: 27 mmol/L (ref 22–32)
Calcium: 9.8 mg/dL (ref 8.9–10.3)
Chloride: 106 mmol/L (ref 98–111)
Creatinine: 0.57 mg/dL (ref 0.44–1.00)
GFR, Estimated: >60 mL/min (ref >60)
Glucose, Bld: 94 mg/dL (ref 70–99)
Potassium: 4 mmol/L (ref 3.5–5.1)
Sodium: 139 mmol/L (ref 135–145)
Total Bilirubin: 0.4 mg/dL (ref 0.0–1.2)
Total Protein: 6.7 g/dL (ref 6.5–8.1)

## 2024-05-21 LAB — TSH: TSH: 0.604 u[IU]/mL (ref 0.350–4.500)

## 2024-05-21 MED ORDER — SODIUM CHLORIDE 0.9 % IV SOLN
10.0000 mg/kg | Freq: Once | INTRAVENOUS | Status: AC
  Administered 2024-05-21: 740 mg via INTRAVENOUS
  Filled 2024-05-21: qty 4.8

## 2024-05-21 MED ORDER — SODIUM CHLORIDE 0.9 % IV SOLN: INTRAVENOUS | Status: DC

## 2024-05-21 NOTE — Patient Instructions (Signed)
 [facility] - A DEPT OF [facility]  Discharge Instructions: Thank you for choosing [facility] to provide your oncology and hematology care.   If you have a lab appointment with the Cancer Center, please go directly to the Cancer Center and check in at the registration area.   Wear comfortable clothing and clothing appropriate for easy access to any Portacath or PICC line.   We strive to give you quality time with your provider. You may need to reschedule your appointment if you arrive late (15 or more minutes).  Arriving late affects you and other patients whose appointments are after yours.  Also, if you miss three or more appointments without notifying the office, you may be dismissed from the clinic at the provider's discretion.      For prescription refill requests, have your pharmacy contact our office and allow 72 hours for refills to be completed.    Today you received the following chemotherapy and/or immunotherapy agents: durvalumab       To help prevent nausea and vomiting after your treatment, we encourage you to take your nausea medication as directed.  BELOW ARE SYMPTOMS THAT SHOULD BE REPORTED IMMEDIATELY: *FEVER GREATER THAN 100.4 F (38 C) OR HIGHER *CHILLS OR SWEATING *NAUSEA AND VOMITING THAT IS NOT CONTROLLED WITH YOUR NAUSEA MEDICATION *UNUSUAL SHORTNESS OF BREATH *UNUSUAL BRUISING OR BLEEDING *URINARY PROBLEMS (pain or burning when urinating, or frequent urination) *BOWEL PROBLEMS (unusual diarrhea, constipation, pain near the anus) TENDERNESS IN MOUTH AND THROAT WITH OR WITHOUT PRESENCE OF ULCERS (sore throat, sores in mouth, or a toothache) UNUSUAL RASH, SWELLING OR PAIN  UNUSUAL VAGINAL DISCHARGE OR ITCHING   Items with * indicate a potential emergency and should be followed up as soon as possible or go to the Emergency Department if any problems should occur.  Please show the CHEMOTHERAPY ALERT CARD or IMMUNOTHERAPY  ALERT CARD at check-in to the Emergency Department and triage nurse.  Should you have questions after your visit or need to cancel or reschedule your appointment, please contact [facility] - A DEPT [facility]  Dept: [phone]  and follow the prompts.  Office hours are 8:00 a.m. to 4:30 p.m. Monday - Friday. Please note that voicemails left after 4:00 p.m. may not be returned until the following business day.  We are closed weekends and major holidays. You have access to a nurse at all times for urgent questions. Please call the main number to the clinic Dept: [phone] and follow the prompts.   For any non-urgent questions, you may also contact your provider using MyChart. We now offer e-Visits for anyone 26 and older to request care online for non-urgent symptoms. For details visit [email or web address].   Also download the MyChart app! Go to the app store, search MyChart, open the app, select [place], and log in with your MyChart username and password.

## 2024-05-21 NOTE — Progress Notes (Signed)
 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 female who presents today for follow up and management of squamous cell carcinoma of the lung. She was last seen on 05/07/2024 by Dr. Onesimo. In the interim, she continued on Durvalumab  therapy.  Ms. Gombos reports his energy levels have improved after recovering from a cold last week. She reports the rattling in her chest has nearly resolved. He has occasional shortness of breath with deep inspiration. She has ongoing right shoulder pain and recently received a steroid injection. In addition, she started physical therapy this week. She denies nausea, vomiting or bowel habit changes.She denies easy bruising or signs of bleeding. She denies fevers, chills, sweats, chest pain, headaches or dizziness. She has no other complaints. Rest of the ROS is below.    MEDICAL HISTORY:  Past Medical History:  Diagnosis Date   AICD (automatic cardioverter/defibrillator) present dual   Medtronic - original placed 2007, generator change 2014, generator extraction 03/14/2024   Anemia    Anticoagulant long-term use    eliquis    Anxiety    Arthralgia of multiple joints    Arthritis pain    Benign hypertensive heart disease    CAD (coronary artery disease) primary cardiologist-- dr danelle taylor   MI and 2 stents 1995 in [place]    Cancer [facility])    Cataract ?   Surgery 2020   CHF NYHA class II, chronic, systolic (HCC)    followed by dr danelle taylor   Complication of anesthesia    Degenerative disc disease, lumbar    Depression    Difficult intubation     Dyslipidemia    History of basal cell carcinoma (BCC) excision    right ankle area s/p  excision in office 06/ 2019  in office   History of DVT of lower extremity yrs ago before 2012   History of MI (myocardial infarction) 1995  in [place]   History of pulmonary embolus (PE) 2012   History of ventricular tachycardia    Hyperlipidemia    Hypersomnia    Hypertension    Ischemic cardiomyopathy    Migraines    Myocardial infarction (HCC)    OA (osteoarthritis)    all over   PAF (paroxysmal atrial fibrillation) (HCC)    Pneumonia    02/2023   Primary localized osteoarthritis of right hip 12/18/2017   Primary localized osteoarthritis of right knee 05/28/2018   S/P coronary artery stent placement 1995   in [facility]   05-21-2018 per pt x2  stents in same coronary artery (unsure BM or DES)   Vitamin D  deficiency disease     SURGICAL HISTORY: Past Surgical History:  Procedure Laterality Date   BIOPSY  06/19/2022   Procedure: BIOPSY;  Surgeon: Elicia Claw, MD;  Location: [facility] ENDOSCOPY;  Service: Gastroenterology;;   BRONCHIAL BIOPSY  08/07/2023   Procedure: BRONCHIAL BIOPSIES;  Surgeon: Shelah Lamar RAMAN, MD;  Location[facility] ENDOSCOPY;  Service: Pulmonary;;   BRONCHIAL BRUSHINGS  08/07/2023   Procedure: BRONCHIAL BRUSHINGS;  Surgeon: Shelah Lamar RAMAN, MD;  Location: [facility] ENDOSCOPY;  Service: Pulmonary;;   BRONCHIAL BRUSHINGS  03/17/2024  Procedure: BRONCHOSCOPY, WITH BRUSH BIOPSY;  Surgeon: Shelah Lamar RAMAN, MD;  Location: [facility] ENDOSCOPY;  Service: Pulmonary;;   BRONCHIAL NEEDLE ASPIRATION BIOPSY  08/07/2023   Procedure: BRONCHIAL NEEDLE ASPIRATION BIOPSIES;  Surgeon: Shelah Lamar RAMAN, MD;  Location: [facility] ENDOSCOPY;  Service: Pulmonary;;   BRONCHIAL NEEDLE ASPIRATION BIOPSY  03/17/2024   Procedure: BRONCHOSCOPY, WITH NEEDLE ASPIRATION BIOPSY;  Surgeon: Shelah Lamar RAMAN, MD;  Location: [facility] ENDOSCOPY;  Service: Pulmonary;;   CARDIAC DEFIBRILLATOR PLACEMENT  2007   CARPAL TUNNEL RELEASE Right 02/26/2024    Procedure: CARPAL TUNNEL RELEASE;  Surgeon: Josefina Chew, MD;  Location: [facility] OR;  Service: Orthopedics;  Laterality: Right;   CATARACT EXTRACTION, BILATERAL     COLONOSCOPY  04/14/2013   colonic polyp, status post polypectomy. Mild panocolonic diverticulosis. Small internal hemorrhoids   CRYOTHERAPY  03/17/2024   Procedure: CRYOTHERAPY;  Surgeon: Shelah Lamar RAMAN, MD;  Location: [facility] ENDOSCOPY;  Service: Pulmonary;;   DILATION AND CURETTAGE OF UTERUS  yrs ago   DIRECT LARYNGOSCOPY Right 10/04/2022   Procedure: DIRECT LARYNGOSCOPY;  Surgeon: Llewellyn Gerard LABOR, DO;  Location: [facility] OR;  Service: ENT;  Laterality: Right;   ENDOBRONCHIAL ULTRASOUND Bilateral 03/17/2024   Procedure: ENDOBRONCHIAL ULTRASOUND (EBUS);  Surgeon: Shelah Lamar RAMAN, MD;  Location: [facility] ENDOSCOPY;  Service: Pulmonary;  Laterality: Bilateral;   ESOPHAGOGASTRODUODENOSCOPY (EGD) WITH PROPOFOL  N/A 06/19/2022   Procedure: ESOPHAGOGASTRODUODENOSCOPY (EGD) WITH PROPOFOL ;  Surgeon: Elicia Claw, MD;  Location: [facility] ENDOSCOPY;  Service: Gastroenterology;  Laterality: N/A;   EYE SURGERY  2020   Cataracts removed   FIDUCIAL MARKER PLACEMENT  08/07/2023   Procedure: FIDUCIAL MARKER PLACEMENT;  Surgeon: Shelah Lamar RAMAN, MD;  Location: [facility] ENDOSCOPY;  Service: Pulmonary;;   ICD GENERATOR REMOVAL N/A 03/14/2024   Procedure: ICD GENERATOR REMOVAL;  Surgeon: Waddell Danelle ORN, MD;  Location: [facility] INVASIVE CV LAB;  Service: Cardiovascular;  Laterality: N/A;   IMPACTION REMOVAL  06/19/2022   Procedure: IMPACTION REMOVAL;  Surgeon: Elicia Claw, MD;  Location: [facility] ENDOSCOPY;  Service: Gastroenterology;;   IMPLANTABLE CARDIOVERTER DEFIBRILLATOR GENERATOR CHANGE N/A 02/04/2013   Procedure: IMPLANTABLE CARDIOVERTER DEFIBRILLATOR GENERATOR CHANGE;  Surgeon: Danelle ORN Waddell, MD;  Location: [facility] CATH LAB;  Service: Cardiovascular;  Laterality: N/A;   IR IMAGING GUIDED PORT INSERTION  10/27/2022   JOINT REPLACEMENT  2019, 2022, 2023   Both hips, both knees    LYMPH NODE BIOPSY Right 10/04/2022   Procedure: EXCISIONAL OF RIGHT DEEP CERVICAL LYMPH NODE;  Surgeon: Llewellyn Gerard LABOR, DO;  Location: [facility] OR;  Service: ENT;  Laterality: Right;   SHOULDER ARTHROSCOPY Right 2015   TOTAL HIP ARTHROPLASTY Right 12/18/2017   Procedure: RIGHT TOTAL HIP ARTHROPLASTY;  Surgeon: Josefina Chew, MD;  Location: [facility] OR;  Service: Orthopedics;  Laterality: Right;   TOTAL HIP ARTHROPLASTY Left 04/19/2021   Procedure: TOTAL HIP ARTHROPLASTY;  Surgeon: Josefina Chew, MD;  Location: [facility] ORS;  Service: Orthopedics;  Laterality: Left;   TOTAL KNEE ARTHROPLASTY Right 05/28/2018   Procedure: TOTAL KNEE ARTHROPLASTY;  Surgeon: Josefina Chew, MD;  Location: [facility] ORS;  Service: Orthopedics;  Laterality: Right;  Adductor Block   TOTAL KNEE ARTHROPLASTY Left 08/12/2021   Procedure: TOTAL KNEE ARTHROPLASTY;  Surgeon: Josefina Chew, MD;  Location: [facility] ORS;  Service: Orthopedics;  Laterality: Left;   TUBAL LIGATION Bilateral yrs ago   VIDEO BRONCHOSCOPY WITH ENDOBRONCHIAL NAVIGATION Bilateral 03/17/2024   Procedure: VIDEO BRONCHOSCOPY WITH ENDOBRONCHIAL NAVIGATION;  Surgeon: Shelah Lamar RAMAN, MD;  Location: [facility] ENDOSCOPY;  Service: Pulmonary;  Laterality: Bilateral;  need EBUS scope  also   VIDEO BRONCHOSCOPY WITH RADIAL ENDOBRONCHIAL ULTRASOUND  08/07/2023   Procedure: VIDEO BRONCHOSCOPY WITH RADIAL ENDOBRONCHIAL ULTRASOUND;  Surgeon: Shelah Lamar RAMAN, MD;  Location: [facility] ENDOSCOPY;  Service: Pulmonary;;   WISDOM TOOTH EXTRACTION      SOCIAL HISTORY: Social History   Socioeconomic History   Marital status: Single    Spouse name: Not on file   Number of children: 0   Years of education: 14   Highest education level: Not on file  Occupational History   Not on file  Tobacco Use   Smoking status: Former    Current packs/day: 0.00    Types: Cigarettes    Start date: 05/21/1966    Quit date: 05/21/1996    Years since quitting: 28.0   Smokeless tobacco: Never  Vaping Use   Vaping  status: Never Used  Substance and Sexual Activity   Alcohol use: No   Drug use: Never   Sexual activity: Not on file  Other Topics Concern   Not on file  Social History Narrative   Not on file   Social Drivers of Health   Financial Resource Strain: Low Risk  (05/28/2018)   Overall Financial Resource Strain (CARDIA)    Difficulty of Paying Living Expenses: Not hard at all  Food Insecurity: No Food Insecurity (08/28/2023)   Hunger Vital Sign    Worried About Running Out of Food in the Last Year: Never true    Ran Out of Food in the Last Year: Never true  Transportation Needs: No Transportation Needs (08/28/2023)   PRAPARE - Administrator, Civil Service (Medical): No    Lack of Transportation (Non-Medical): No  Physical Activity: Not on file  Stress: Not on file  Social Connections: Not on file  Intimate Partner Violence: Not At Risk (08/28/2023)   Humiliation, Afraid, Rape, and Kick questionnaire    Fear of Current or Ex-Partner: No    Emotionally Abused: No    Physically Abused: No    Sexually Abused: No    FAMILY HISTORY: Family History  Problem Relation Age of Onset   Hypertension Mother    Thyroid  disease Mother    Alzheimer's disease Mother    Hearing loss Mother    Coronary artery disease Father    Pulmonary embolism Father    Arthritis Father    Heart disease Father    Congestive Heart Failure Maternal Grandmother    Hypertension Maternal Grandmother    Heart attack Maternal Grandfather    Other Maternal Grandfather        carotid disease   Dementia Paternal Grandmother    Other Paternal Grandfather 65       accident   Cancer Sister     ALLERGIES:  is allergic to sumatriptan succinate.  MEDICATIONS:  Current Outpatient Medications  Medication Sig Dispense Refill   apixaban  (ELIQUIS ) 5 MG TABS tablet Take 1 tablet (5 mg total) by mouth 2 (two) times daily. 60 tablet 5   b complex vitamins capsule Take 1 capsule by mouth daily.     Calcium   Carb-Cholecalciferol  (CALCIUM  600 + D PO) Take 1 tablet by mouth daily.     carvedilol  (COREG ) 12.5 MG tablet Take 12.5 mg by mouth 2 (two) times daily with a meal.      Cholecalciferol  (VITAMIN D3) 125 MCG (5000 UT) capsule Take 5,000 Units by mouth daily.     cyanocobalamin  (VITAMIN B12) 1000 MCG tablet Take 1,000 mcg by mouth daily.  FIBER PO Take 1 capsule by mouth 2 (two) times daily.     gabapentin  (NEURONTIN ) 300 MG capsule Take 300 mg by mouth 3 (three) times daily.     HYDROcodone -acetaminophen  (NORCO/VICODIN) 5-325 MG tablet Take 1 tablet by mouth every 6 (six) hours as needed for moderate pain (pain score 4-6). 30 tablet 0   ipratropium (ATROVENT  HFA) 17 MCG/ACT inhaler Inhale 2 puffs into the lungs every 6 (six) hours as needed for wheezing. 1 each 1   loratadine  (CLARITIN ) 10 MG tablet Take 10 mg by mouth daily.     Omega-3 Fatty Acids (FISH OIL) 1000 MG CAPS Take 1,000 mg by mouth daily.     prochlorperazine  (COMPAZINE ) 10 MG tablet Take 1 tablet (10 mg total) by mouth every 6 (six) hours as needed for nausea or vomiting. 30 tablet 1   ramipril  (ALTACE ) 2.5 MG tablet Take 2.5 mg by mouth daily.       simvastatin  (ZOCOR ) 40 MG tablet Take 40 mg by mouth at bedtime.       tiZANidine  (ZANAFLEX ) 4 MG tablet Take 2 mg by mouth every 8 (eight) hours as needed for muscle spasms.     traZODone  (DESYREL ) 50 MG tablet Take 50 mg by mouth at bedtime.      venlafaxine  (EFFEXOR ) 75 MG tablet Take 75 mg by mouth daily.     vitamin E  400 UNIT capsule Take 400 Units by mouth daily.     zonisamide  (ZONEGRAN ) 50 MG capsule Take 50-100 mg by mouth See admin instructions. Take 50mg  by mouth in the morning and 100mg  at night.     No current facility-administered medications for this visit.    REVIEW OF SYSTEMS:    10 Point review of Systems was done is negative except as noted above.   PHYSICAL EXAMINATION: .BP 123/70 (BP Location: Left Arm, Patient Position: Sitting)   Pulse 77   Temp (!)  97.5 F (36.4 C) (Temporal)   Resp 18   Ht 5' 7 (1.702 m)   Wt 144 lb 11.2 oz (65.6 kg)   SpO2 97%   BMI 22.66 kg/m    GENERAL:alert, in no acute distress and comfortable SKIN: no acute rashes, no significant lesions EYES: conjunctiva are pink and non-injected, sclera anicteric LUNGS: normal respiratory effort, clear to auscultation HEART: regular rate & rhythm Extremity: no pedal edema PSYCH: alert & oriented x 3 with fluent speech NEURO: no focal motor/sensory deficits   LABORATORY DATA:  I have reviewed the data as listed    Latest Ref Rng & Units 05/21/2024   10:27 AM 05/07/2024    1:37 PM 04/23/2024   10:52 AM  CBC  WBC 4.0 - 10.5 K/uL 5.0  4.1  4.1   Hemoglobin 12.0 - 15.0 g/dL 86.4  86.2  85.7   Hematocrit 36.0 - 46.0 % 39.8  39.5  41.3   Platelets 150 - 400 K/uL 113  117  96       Latest Ref Rng & Units 05/21/2024   10:27 AM 05/07/2024    1:37 PM 04/23/2024   10:52 AM  CMP  Glucose 70 - 99 mg/dL 94  899  93   BUN 8 - 23 mg/dL 13  12  11    Creatinine 0.44 - 1.00 mg/dL 9.42  9.35  9.34   Sodium 135 - 145 mmol/L 139  141  139   Potassium 3.5 - 5.1 mmol/L 4.0  3.4  3.8   Chloride 98 - 111 mmol/L 106  107  106   CO2 22 - 32 mmol/L 27  29  28    Calcium  8.9 - 10.3 mg/dL 9.8  9.0  9.8   Total Protein 6.5 - 8.1 g/dL 6.7  6.6  6.5   Total Bilirubin 0.0 - 1.2 mg/dL 0.4  0.4  0.5   Alkaline Phos 38 - 126 U/L 87  93  94   AST 15 - 41 U/L 14  15  16    ALT 0 - 44 U/L 11  12  12     Lab Results  Component Value Date   LDH 166 12/10/2023   Molecular Pathology 10/21/2022:    Surgical Pathology 10/04/22: A. LYMPH NODE, RIGHT LEVEL 2 DEEP CERVICAL, EXCISION:  -Diffuse large B-cell lymphoma  -See comment COMMENT: The sections show diffuse effacement of the lymph nodal architecture primarily by a population of large lymphoid cells characterized by vesicular chromatin and small nucleoli associated with apoptosis and brisk mitosis.  In some areas, the large atypical  lymphoid proliferation extends into the perinodal adipose tissue.  In this background, there are scattered variably sized and somewhat disrupted aggregates of primarily small lymphoid cells characterized by high nuclear cytoplasmic ratio, angulated nuclear contours and small to inconspicuous nucleoli. Flow cytometric analysis was performed [facility] [id number]) and shows a monoclonal, lambda restricted B-cell population expressing CD10.  In addition, immunohistochemical stains for CD3, CD5, CD10, CD20, PAX5, BCL6, Bcl-2, Ki-67, CD30, CD138, CD21, EBV in addition to in situ hybridization for kappa and lambda were performed with appropriate controls.  The large lymphoid cells are positive for CD20, PAX5, CD10, BCL6, Bcl-2, and partially for cytoplasmic lambda.  No significant staining is seen with EBV, CD30, CD138 or cytoplasmic kappa.  Ki67 shows variably increased expression (more than 50% in some areas). CD21 highlights scattered somewhat disrupted follicular dendritic networks. The lymphoid aggregates of primarily small lymphoid cells previously described show positivity for B-cell markers CD20 and PAX5 in addition to CD10 and Bcl-2.  There is an admixed variable T-cell component in the background as seen with CD3 and CD5 and there is no apparent co-expression of CD5 in B-cell areas.  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    RADIOGRAPHIC STUDIES: I have personally reviewed the radiological images as listed and agreed with the findings in the report. DG Shoulder Right Result Date: 05/06/2024 EXAM: 1 VIEW XRAY OF THE RIGHT SHOULDER 05/05/2024 10:36:54 AM COMPARISON: None available. CLINICAL HISTORY: Shoulder pain for 2 weeks. History of squamous cell lung cancer. X-rays ordered to rule out metastases. FINDINGS: BONES AND JOINTS: Glenohumeral joint is  normally aligned. No acute fracture or dislocation. Degenerative subcortical cysts are observed in the humeral head and greater tuberosity region laterally. Moderate glenohumeral and acromioclavicular joint osteoarthritis with associated spurring. Narrowing of the subacromial space consistent with chronic rotator cuff disease. Type 2 subacromial morphology (curved). SOFT TISSUES: Right chest power injectable port in place; tip projects over the right atrium. AICD leads noted. Bandlike opacity in the right upper lung may reflect posttreatment changes. Small adjacent fiducial marker noted. Atheromatous vascular calcification of the aortic arch. IMPRESSION: 1. No acute findings. 2. Moderate glenohumeral joint osteoarthritis with associated spurring. 3. Moderate acromioclavicular joint osteoarthritis with associated spurring. 4. Narrowing of the subacromial space consistent with chronic rotator cuff disease. 5. Degenerative subcortical cysts in the humeral head and greater tuberosity region laterally. 6. Atherosclerosis. 7. Chronic scarring  in the upper lung. Electronically signed by: Ryan Salvage MD 05/06/2024 02:20 PM EDT RP Workstation: [id number]   US  LT LOWER EXTREM LTD SOFT TISSUE NON VASCULAR Result Date: 04/30/2024 CLINICAL DATA:  History of metastatic non-small cell lung cancer. Concern for left posterior thigh mass. EXAM: ULTRASOUND left LOWER EXTREMITY LIMITED TECHNIQUE: Ultrasound examination of the lower extremity soft tissues was performed in the area of clinical concern. COMPARISON:  Ultrasound dated 11/21/2010. FINDINGS: Targeted sonographic images of the soft tissues of the posterior left thigh performed. There is a 2.1 x 1.7 x 1.9 cm hypoechoic mass with irregular margins in the soft tissues of the posterior left thigh suspicious for malignancy or metastatic disease. IMPRESSION: Soft tissue mass in the posterior left thigh concerning for metastatic disease. Electronically Signed   By: Vanetta Chou M.D.   On: 04/30/2024 14:28    ASSESSMENT & PLAN:  Rachel Bates is 78 y.o.  female who presents for a follow up for continued management of squamous cell carcinoma of the lung.   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 Completed radiation therapy on 11/02/2023.  --Started Cycle 1, Day 1 of Durvalumab  on 09/18/2023.   --CT Head from 12/31/2023 showed no intracranial metastatic disease --PET scan from 12/31/2023 showed previous RUL perihilar mass and previously seen nodes in the right hilum in the mid mediastinum appears smaller and shows less uptake. There are new hypermetabolic nodes more superiorly in the right side of the mediastinum along with LUL nodular area with more uptake.  --CT chest from 02/13/2024 continued decreased in size of treated mass in the anterior perihilar RUL.Interval increase in size of nodules throughout the right apex.Unchanged clustered nodules in the peripheral posterior left upper lobe.Interval enlargement of high right pretracheal, paratracheal, and supraclavicular nodes, previously FDG avid, consistent with worsened nodal metastatic disease. Similar appearance of matted, treated low pretracheal, subcarinal, and right hilar lymph nodes --Patient underwent EBUS with  biopsy on 03/17/2024. Pathology is pending for some of the biopsies. Reviewed so far the 4R lymph node and RUL lung nodule is positive for malignancy, SCC.  --Received radiation to the right chest with Dr. Jomarie on 04/24/2024-05/16/2024. Received 30 Gy in 15 Fx.  PLAN: --Due for Cycle 11, Day 1 of Durvalumab  today --Labs today reviewed and adequate for treatment today. WBC 5.0, Hgb 13.5, Plt 113, creatinine normal.  --Proceed with treatment without any dose modifications.  --RTC in 2 weeks for labs and follow up prior to Cycle 12, Day 1 of Durvalumab .   #Left thigh mass: #Scalp concerns: --US  from  04/30/2024 showed 2.1 x 1.7 x 1.9 cm mass with irregular margins in the soft tissues of the posterior left thigh suspicious for malignancy.  --scheduled for biopsy on 06/06/2024.  --recommend CT CAP to evaluate for progressive disease.  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    All of the patient's questions were answered with apparent satisfaction. The patient knows to call the clinic with any problems, questions or concerns.   I have spent a total of 30 minutes minutes of face-to-face and non-face-to-face time, preparing to see the patient,performing a medically appropriate examination,  counseling and educating the patient, ordering medications/tests/procedures,  documenting clinical information in the electronic health record, independently interpreting results and communicating results to the patient, and care coordination.   Johnston Police PA-C Dept of Hematology and Oncology [facility] Phone: [phone]

## 2024-05-21 NOTE — Progress Notes (Signed)
 Continue Imfinzi  at 740 mg per Dr. Onesimo. CT reevaluation in the next few weeks.  Harlene Nasuti, PharmD Oncology Infusion Pharmacist 05/21/2024 11:56 AM

## 2024-05-23 LAB — T4: T4, Total: 7.3 ug/dL (ref 4.5–12.0)

## 2024-05-26 DIAGNOSIS — M4802 Spinal stenosis, cervical region: Secondary | ICD-10-CM | POA: Diagnosis not present

## 2024-05-26 DIAGNOSIS — M4803 Spinal stenosis, cervicothoracic region: Secondary | ICD-10-CM | POA: Diagnosis not present

## 2024-05-26 DIAGNOSIS — R918 Other nonspecific abnormal finding of lung field: Secondary | ICD-10-CM | POA: Diagnosis not present

## 2024-05-26 DIAGNOSIS — M4319 Spondylolisthesis, multiple sites in spine: Secondary | ICD-10-CM | POA: Diagnosis not present

## 2024-05-26 DIAGNOSIS — M25511 Pain in right shoulder: Secondary | ICD-10-CM | POA: Diagnosis not present

## 2024-05-26 DIAGNOSIS — M25611 Stiffness of right shoulder, not elsewhere classified: Secondary | ICD-10-CM | POA: Diagnosis not present

## 2024-05-26 DIAGNOSIS — M47812 Spondylosis without myelopathy or radiculopathy, cervical region: Secondary | ICD-10-CM | POA: Diagnosis not present

## 2024-05-26 DIAGNOSIS — M8588 Other specified disorders of bone density and structure, other site: Secondary | ICD-10-CM | POA: Diagnosis not present

## 2024-05-26 DIAGNOSIS — Z8572 Personal history of non-Hodgkin lymphomas: Secondary | ICD-10-CM | POA: Diagnosis not present

## 2024-05-26 DIAGNOSIS — M542 Cervicalgia: Secondary | ICD-10-CM | POA: Diagnosis not present

## 2024-05-26 DIAGNOSIS — Z981 Arthrodesis status: Secondary | ICD-10-CM | POA: Diagnosis not present

## 2024-05-26 DIAGNOSIS — M47813 Spondylosis without myelopathy or radiculopathy, cervicothoracic region: Secondary | ICD-10-CM | POA: Diagnosis not present

## 2024-05-27 ENCOUNTER — Ambulatory Visit (HOSPITAL_COMMUNITY)
Admission: RE | Admit: 2024-05-27 | Discharge: 2024-05-27 | Disposition: A | Discharge status: Home / Self Care | Source: Ambulatory Visit | Attending: Hematology | Admitting: Hematology

## 2024-05-27 DIAGNOSIS — C3490 Malignant neoplasm of unspecified part of unspecified bronchus or lung: Secondary | ICD-10-CM | POA: Diagnosis not present

## 2024-05-27 DIAGNOSIS — R590 Localized enlarged lymph nodes: Secondary | ICD-10-CM | POA: Diagnosis not present

## 2024-05-27 DIAGNOSIS — C3491 Malignant neoplasm of unspecified part of right bronchus or lung: Secondary | ICD-10-CM | POA: Insufficient documentation

## 2024-05-27 DIAGNOSIS — K769 Liver disease, unspecified: Secondary | ICD-10-CM | POA: Diagnosis not present

## 2024-05-27 MED ORDER — IOHEXOL 300 MG/ML  SOLN
100.0000 mL | Freq: Once | INTRAMUSCULAR | Status: AC | PRN
  Administered 2024-05-27: 100 mL via INTRAVENOUS

## 2024-05-29 DIAGNOSIS — M25611 Stiffness of right shoulder, not elsewhere classified: Secondary | ICD-10-CM | POA: Diagnosis not present

## 2024-05-29 DIAGNOSIS — M542 Cervicalgia: Secondary | ICD-10-CM | POA: Diagnosis not present

## 2024-05-29 DIAGNOSIS — M25511 Pain in right shoulder: Secondary | ICD-10-CM | POA: Diagnosis not present

## 2024-06-02 DIAGNOSIS — M25611 Stiffness of right shoulder, not elsewhere classified: Secondary | ICD-10-CM | POA: Diagnosis not present

## 2024-06-02 DIAGNOSIS — M542 Cervicalgia: Secondary | ICD-10-CM | POA: Diagnosis not present

## 2024-06-02 DIAGNOSIS — M25511 Pain in right shoulder: Secondary | ICD-10-CM | POA: Diagnosis not present

## 2024-06-03 ENCOUNTER — Ambulatory Visit: Payer: Medicare Other

## 2024-06-04 ENCOUNTER — Inpatient Hospital Stay: Admitting: Hematology

## 2024-06-04 ENCOUNTER — Inpatient Hospital Stay

## 2024-06-04 DIAGNOSIS — C3491 Malignant neoplasm of unspecified part of right bronchus or lung: Secondary | ICD-10-CM

## 2024-06-04 DIAGNOSIS — Z79620 Long term (current) use of immunosuppressive biologic: Secondary | ICD-10-CM | POA: Diagnosis not present

## 2024-06-04 DIAGNOSIS — Z87891 Personal history of nicotine dependence: Secondary | ICD-10-CM | POA: Diagnosis not present

## 2024-06-04 DIAGNOSIS — Z7189 Other specified counseling: Secondary | ICD-10-CM | POA: Diagnosis not present

## 2024-06-04 DIAGNOSIS — Z5112 Encounter for antineoplastic immunotherapy: Secondary | ICD-10-CM | POA: Diagnosis not present

## 2024-06-04 DIAGNOSIS — C3411 Malignant neoplasm of upper lobe, right bronchus or lung: Secondary | ICD-10-CM | POA: Diagnosis not present

## 2024-06-04 LAB — T4, FREE: Free T4: 0.77 ng/dL (ref 0.61–1.12)

## 2024-06-04 LAB — CBC WITH DIFFERENTIAL (CANCER CENTER ONLY)
Abs Immature Granulocytes: 0.02 K/uL (ref 0.00–0.07)
Basophils Absolute: 0 K/uL (ref 0.0–0.1)
Basophils Relative: 0 %
Eosinophils Absolute: 0.1 K/uL (ref 0.0–0.5)
Eosinophils Relative: 1 %
HCT: 43.1 % (ref 36.0–46.0)
Hemoglobin: 14.8 g/dL (ref 12.0–15.0)
Immature Granulocytes: 0 %
Lymphocytes Relative: 7 %
Lymphs Abs: 0.5 K/uL — ABNORMAL LOW (ref 0.7–4.0)
MCH: 31.4 pg (ref 26.0–34.0)
MCHC: 34.3 g/dL (ref 30.0–36.0)
MCV: 91.5 fL (ref 80.0–100.0)
Monocytes Absolute: 0.6 K/uL (ref 0.1–1.0)
Monocytes Relative: 9 %
Neutro Abs: 5.2 K/uL (ref 1.7–7.7)
Neutrophils Relative %: 83 %
Platelet Count: 115 K/uL — ABNORMAL LOW (ref 150–400)
RBC: 4.71 MIL/uL (ref 3.87–5.11)
RDW: 13.7 % (ref 11.5–15.5)
WBC Count: 6.3 K/uL (ref 4.0–10.5)
nRBC: 0 % (ref 0.0–0.2)

## 2024-06-04 LAB — TSH: TSH: 2.95 u[IU]/mL (ref 0.350–4.500)

## 2024-06-04 LAB — CMP (CANCER CENTER ONLY)
ALT: 17 U/L (ref 0–44)
AST: 23 U/L (ref 15–41)
Albumin: 4.2 g/dL (ref 3.5–5.0)
Alkaline Phosphatase: 96 U/L (ref 38–126)
Anion gap: 8 (ref 5–15)
BUN: 15 mg/dL (ref 8–23)
CO2: 28 mmol/L (ref 22–32)
Calcium: 9.7 mg/dL (ref 8.9–10.3)
Chloride: 103 mmol/L (ref 98–111)
Creatinine: 0.62 mg/dL (ref 0.44–1.00)
GFR, Estimated: >60 mL/min (ref >60)
Glucose, Bld: 101 mg/dL — ABNORMAL HIGH (ref 70–99)
Potassium: 4 mmol/L (ref 3.5–5.1)
Sodium: 138 mmol/L (ref 135–145)
Total Bilirubin: 0.4 mg/dL (ref 0.0–1.2)
Total Protein: 6.9 g/dL (ref 6.5–8.1)

## 2024-06-04 MED ORDER — HYDROCODONE-ACETAMINOPHEN 5-325 MG PO TABS: 1.0000 | ORAL_TABLET | Freq: Four times a day (QID) | ORAL | 0 refills | Status: DC | PRN

## 2024-06-04 NOTE — Progress Notes (Signed)
 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 female who has been referred to us  by Llewellyn Gerard Caldron, DO for evaluation and management of non hodgkin's lymphoma. She has a hx of chronic systolic heart failure.   Nasolaryngoscopy revealed a mass lesion of the left oropharynx, emanating from the left base of tongue/glossotonsillar sulcus. Patient subsequently had a CT neck with contrast performed on 08/09/2022, which reported extensive bilateral adenopaty, with greatest lymph node conglomerate measuring 3.2 x 2.4 cm in the right level 2. Patient underwent core needle biopsy on 08/18/22 and endorsed persistent throat pain and odynophagia at the time. She is a former smoker and quit in the late 90s. SHe endorsed heavy tobacco use prior to quitting, with 1.5 pack/day for about 30 years.    Today, she is accompanied by her daughter. She complains of a sore throat beginning around Thanksgiving time. She was given antibiotics and prednisone  which did not improve symptoms. She endorses swelling in her neck with associated soreness which came quickly past 2-3 weeks. She reports that her neck edema has recently grown on both the left and right sides. Her neck pain is in a specific area. She also complains of recent her neck pain if she turns her neck.   She did present Emergency EMT because something was stuck in her throat. Endoscopy revealed that it was food and it was pushed down. An ulcer was also found in her  esophagus and some narrowing which will be stretched in three months. She has not had any issues with swallowing food since then. She has not had a biopsy of issue on the back of her tongue. No other lumps/bumps, unexplained fevers, chills, night sweat, change in breathing, abdominal pain, or skin rashes. She reports discomfort with drinking tea which caused her to eat less as well.    She is allergic to Sumatriptan and another medication she cannot recall. She does not consume alcohol and has quit smoking in the 90s. 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 She believes the sore was caused by shoe discomfort.   She denies any facial puffiness, abdominal pain, recent change in bowel habits or urination. She reports that her defibrillator has never gone off.    She reports a ganglion cyst on her right knee, 2 knee replacements, and 2 hip replacements. She denies a Fhx of blood disorders. However, her sister has lung cancer and was a frequent smoker.   She is UTD with her vaccinations, incuding influenza, RSV, and COVID-19 booster. She has endorsed migraines since childhood. She lives on her own is able to complete daily activities independently. She will follow-up with Dr. Llewellyn soon. She follows up with cardiologist regularly. Her a fib, pacemaker, and ICD device have been stable. She has not had a recent ECHO.  SUMMARY OF ONCOLOGIC HISTORY: Oncology History  Diffuse large B-cell lymphoma of lymph nodes of multiple regions (HCC)  08/14/2022 Cancer Staging   Staging form: Hodgkin and Non-Hodgkin Lymphoma,  AJCC 8th Edition - Clinical stage from 08/14/2022: Stage III (Diffuse large B-cell lymphoma) - Signed by Onesimo Emaline Brink, MD on 09/10/2023 Stage prefix: Initial diagnosis   10/19/2022 Initial Diagnosis   Diffuse large B-cell lymphoma of lymph nodes of multiple regions   10/31/2022 - 02/17/2023  Chemotherapy   Patient is on Treatment Plan : NON-HODGKIN'S LYMPHOMA R-CEOP q21d x 6 Cycles     Cancer of upper lobe of right lung (HCC)  09/10/2023 Initial Diagnosis   Cancer of upper lobe of right lung (HCC)   09/10/2023 Cancer Staging   Staging form: Lung, AJCC V9 - Clinical: cT1c, cN2, cM0 - Signed by Onesimo Emaline Brink, MD on 09/10/2023 Method of lymph node assessment: Clinical Histologic grade (G): GX Histologic grading system: 4 grade system Laterality: Right   Squamous cell lung cancer, right (HCC)  09/10/2023 Initial Diagnosis   Squamous cell lung cancer, right (HCC)   09/18/2023 - 10/16/2023 Chemotherapy   Patient is on Treatment Plan : LUNG Carboplatin  + Paclitaxel  + XRT q7d     01/02/2024 -  Chemotherapy   Patient is on Treatment Plan : LUNG Durvalumab  (10) q14d      INTERVAL HISTORY:  Rachel Bates is a 78 y.o. female who is here today for continued evaluation and management of squamous cell carcinoma of the lung. accompanied by friends.  she was last seen by me on 05/07/2024; at the time she mentioned experiencing persisting shoulder pain, and some occasional nausea.    She was seen by Neomi Johnston DASEN, PA-C on 05/21/2024 and she reported improvement in energy levels following a cold, with some persisting rattling, occasional shortness of breath with deep inspiration. Additionally, mentioned ongoing right shoulder pain.  Today, she says that she has noticed some drooping in her left eye recently, which is a new occurrence for her. Denies ptosis, double vision or worsened/differing headaches. She notes that her vision is more affected when looking downwards, as if her eye is closed.  Her last dose of radiation was around 05/16/2024.  Her shoulder pain is still persisting, despite having received an injection at the site of pain. She will be soon be starting physical therapy. She did see Neurosurgeon, Dr. Smith who ordered a C-spine CT that showed degenerative changes and  right supraclavicular mass. She says that her pain has been controlled with Hydrocodone , but has run out of this.  She notes that the anterior of her shin and knee has been numb for the past couple of weeks. She denies change to mass in her posterior left thigh.   Denies any skin rashes, chest pain, new lumps/bumps, diarrhea, bowel/urinary changes, or SOB.  REVIEW OF SYSTEMS:   10 Point review of systems of done and is negative except as noted above.  MEDICAL HISTORY Past Medical History:  Diagnosis Date   AICD (automatic cardioverter/defibrillator) present dual   Medtronic - original placed 2007, generator change 2014, generator extraction 03/14/2024   Anemia    Anticoagulant long-term use    eliquis    Anxiety    Arthralgia of multiple joints    Arthritis pain    Benign hypertensive heart disease    CAD (coronary artery disease) primary cardiologist-- dr danelle taylor   MI and 2 stents 1995 in [place]    Cancer [facility])    Cataract ?   Surgery 2020   CHF NYHA class II, chronic, systolic (HCC)    followed by dr danelle taylor   Complication of anesthesia    Degenerative  disc disease, lumbar    Depression    Difficult intubation    Dyslipidemia    History of basal cell carcinoma (BCC) excision    right ankle area s/p  excision in office 06/ 2019  in office   History of DVT of lower extremity yrs ago before 2012   History of MI (myocardial infarction) 1995  in [place]   History of pulmonary embolus (PE) 2012   History of ventricular tachycardia    Hyperlipidemia    Hypersomnia    Hypertension    Ischemic cardiomyopathy    Migraines    Myocardial infarction (HCC)    OA (osteoarthritis)    all over   PAF (paroxysmal atrial fibrillation) (HCC)    Pneumonia    02/2023   Primary localized osteoarthritis of right hip 12/18/2017   Primary localized osteoarthritis of right knee 05/28/2018   S/P coronary artery stent placement 1995   in [facility]   05-21-2018 per pt x2  stents in same  coronary artery (unsure BM or DES)   Vitamin D  deficiency disease     SURGICAL HISTORY Past Surgical History:  Procedure Laterality Date   BIOPSY  06/19/2022   Procedure: BIOPSY;  Surgeon: Elicia Claw, MD;  Location: [facility] ENDOSCOPY;  Service: Gastroenterology;;   BRONCHIAL BIOPSY  08/07/2023   Procedure: BRONCHIAL BIOPSIES;  Surgeon: Shelah Lamar RAMAN, MD;  Location: [facility] ENDOSCOPY;  Service: Pulmonary;;   BRONCHIAL BRUSHINGS  08/07/2023   Procedure: BRONCHIAL BRUSHINGS;  Surgeon: Shelah Lamar RAMAN, MD;  Location: [facility] ENDOSCOPY;  Service: Pulmonary;;   BRONCHIAL BRUSHINGS  03/17/2024   Procedure: BRONCHOSCOPY, WITH BRUSH BIOPSY;  Surgeon: Shelah Lamar RAMAN, MD;  Location: [facility] ENDOSCOPY;  Service: Pulmonary;;   BRONCHIAL NEEDLE ASPIRATION BIOPSY  08/07/2023   Procedure: BRONCHIAL NEEDLE ASPIRATION BIOPSIES;  Surgeon: Shelah Lamar RAMAN, MD;  Location: [facility] ENDOSCOPY;  Service: Pulmonary;;   BRONCHIAL NEEDLE ASPIRATION BIOPSY  03/17/2024   Procedure: BRONCHOSCOPY, WITH NEEDLE ASPIRATION BIOPSY;  Surgeon: Shelah Lamar RAMAN, MD;  Location: [facility] ENDOSCOPY;  Service: Pulmonary;;   CARDIAC DEFIBRILLATOR PLACEMENT  2007   CARPAL TUNNEL RELEASE Right 02/26/2024   Procedure: CARPAL TUNNEL RELEASE;  Surgeon: Josefina Chew, MD;  Location: [facility] OR;  Service: Orthopedics;  Laterality: Right;   CATARACT EXTRACTION, BILATERAL     COLONOSCOPY  04/14/2013   colonic polyp, status post polypectomy. Mild panocolonic diverticulosis. Small internal hemorrhoids   CRYOTHERAPY  03/17/2024   Procedure: CRYOTHERAPY;  Surgeon: Shelah Lamar RAMAN, MD;  Location: [facility] ENDOSCOPY;  Service: Pulmonary;;   DILATION AND CURETTAGE OF UTERUS  yrs ago   DIRECT LARYNGOSCOPY Right 10/04/2022   Procedure: DIRECT LARYNGOSCOPY;  Surgeon: Llewellyn Gerard LABOR, DO;  Location: [facility] OR;  Service: ENT;  Laterality: Right;   ENDOBRONCHIAL ULTRASOUND Bilateral 03/17/2024   Procedure: ENDOBRONCHIAL ULTRASOUND (EBUS);  Surgeon: Shelah Lamar RAMAN, MD;  Location: [facility]  ENDOSCOPY;  Service: Pulmonary;  Laterality: Bilateral;   ESOPHAGOGASTRODUODENOSCOPY (EGD) WITH PROPOFOL  N/A 06/19/2022   Procedure: ESOPHAGOGASTRODUODENOSCOPY (EGD) WITH PROPOFOL ;  Surgeon: Elicia Claw, MD;  Location: [facility] ENDOSCOPY;  Service: Gastroenterology;  Laterality: N/A;   EYE SURGERY  2020   Cataracts removed   FIDUCIAL MARKER PLACEMENT  08/07/2023   Procedure: FIDUCIAL MARKER PLACEMENT;  Surgeon: Shelah Lamar RAMAN, MD;  Location: [facility] ENDOSCOPY;  Service: Pulmonary;;   ICD GENERATOR REMOVAL N/A 03/14/2024   Procedure: ICD GENERATOR REMOVAL;  Surgeon: Waddell Danelle ORN, MD;  [facility]: [facility] INVASIVE CV LAB;  Service: Cardiovascular;  Laterality: N/A;   IMPACTION REMOVAL  06/19/2022  Procedure: IMPACTION REMOVAL;  Surgeon: Elicia Claw, MD;  Location: [facility] ENDOSCOPY;  Service: Gastroenterology;;   IMPLANTABLE CARDIOVERTER DEFIBRILLATOR GENERATOR CHANGE N/A 02/04/2013   Procedure: IMPLANTABLE CARDIOVERTER DEFIBRILLATOR GENERATOR CHANGE;  Surgeon: Danelle LELON Birmingham, MD;  Location: [facility] CATH LAB;  Service: Cardiovascular;  Laterality: N/A;   IR IMAGING GUIDED PORT INSERTION  10/27/2022   JOINT REPLACEMENT  2019, 2022, 2023   Both hips, both knees   LYMPH NODE BIOPSY Right 10/04/2022   Procedure: EXCISIONAL OF RIGHT DEEP CERVICAL LYMPH NODE;  Surgeon: Llewellyn Gerard LABOR, DO;  Location: [facility] OR;  Service: ENT;  Laterality: Right;   SHOULDER ARTHROSCOPY Right 2015   TOTAL HIP ARTHROPLASTY Right 12/18/2017   Procedure: RIGHT TOTAL HIP ARTHROPLASTY;  Surgeon: Josefina Chew, MD;  [facility]: [facility] OR;  Service: Orthopedics;  Laterality: Right;   TOTAL HIP ARTHROPLASTY Left 04/19/2021   Procedure: TOTAL HIP ARTHROPLASTY;  Surgeon: Josefina Chew, MD;  Location: [facility] ORS;  Service: Orthopedics;  Laterality: Left;   TOTAL KNEE ARTHROPLASTY Right 05/28/2018   Procedure: TOTAL KNEE ARTHROPLASTY;  Surgeon: Josefina Chew, MD;  Location: [facility] ORS;  Service: Orthopedics;  Laterality: Right;  Adductor Block   TOTAL  KNEE ARTHROPLASTY Left 08/12/2021   Procedure: TOTAL KNEE ARTHROPLASTY;  Surgeon: Josefina Chew, MD;  Location: [facility] ORS;  Service: Orthopedics;  Laterality: Left;   TUBAL LIGATION Bilateral yrs ago   VIDEO BRONCHOSCOPY WITH ENDOBRONCHIAL NAVIGATION Bilateral 03/17/2024   Procedure: VIDEO BRONCHOSCOPY WITH ENDOBRONCHIAL NAVIGATION;  Surgeon: Shelah Lamar RAMAN, MD;  Location: [facility] ENDOSCOPY;  Service: Pulmonary;  Laterality: Bilateral;  need EBUS scope also   VIDEO BRONCHOSCOPY WITH RADIAL ENDOBRONCHIAL ULTRASOUND  08/07/2023   Procedure: VIDEO BRONCHOSCOPY WITH RADIAL ENDOBRONCHIAL ULTRASOUND;  Surgeon: Shelah Lamar RAMAN, MD;  Location: [facility] ENDOSCOPY;  Service: Pulmonary;;   WISDOM TOOTH EXTRACTION      SOCIAL HISTORY Social History   Tobacco Use   Smoking status: Former    Current packs/day: 0.00    Types: Cigarettes    Start date: 05/21/1966    Quit date: 05/21/1996    Years since quitting: 28.0   Smokeless tobacco: Never  Vaping Use   Vaping status: Never Used  Substance Use Topics   Alcohol use: No   Drug use: Never    Social History   Social History Narrative   Not on file    SOCIAL DRIVERS OF HEALTH SDOH Screenings   Food Insecurity: No Food Insecurity (08/28/2023)  Housing: Low Risk  (08/28/2023)  Transportation Needs: No Transportation Needs (08/28/2023)  Utilities: Not At Risk (08/28/2023)  Depression (PHQ2-9): Low Risk  (05/07/2024)  Financial Resource Strain: Low Risk  (05/28/2018)  Tobacco Use: Medium Risk (03/17/2024)     FAMILY HISTORY Family History  Problem Relation Age of Onset   Hypertension Mother    Thyroid  disease Mother    Alzheimer's disease Mother    Hearing loss Mother    Coronary artery disease Father    Pulmonary embolism Father    Arthritis Father    Heart disease Father    Congestive Heart Failure Maternal Grandmother    Hypertension Maternal Grandmother    Heart attack Maternal Grandfather    Other Maternal Grandfather        carotid disease    Dementia Paternal Grandmother    Other Paternal Grandfather 45       accident   Cancer Sister      ALLERGIES: is allergic to sumatriptan succinate.  MEDICATIONS  Current Outpatient Medications  Medication Sig Dispense Refill  apixaban  (ELIQUIS ) 5 MG TABS tablet Take 1 tablet (5 mg total) by mouth 2 (two) times daily. 60 tablet 5   b complex vitamins capsule Take 1 capsule by mouth daily.     Calcium  Carb-Cholecalciferol  (CALCIUM  600 + D PO) Take 1 tablet by mouth daily.     carvedilol  (COREG ) 12.5 MG tablet Take 12.5 mg by mouth 2 (two) times daily with a meal.      Cholecalciferol  (VITAMIN D3) 125 MCG (5000 UT) capsule Take 5,000 Units by mouth daily.     cyanocobalamin  (VITAMIN B12) 1000 MCG tablet Take 1,000 mcg by mouth daily.     FIBER PO Take 1 capsule by mouth 2 (two) times daily.     gabapentin  (NEURONTIN ) 300 MG capsule Take 300 mg by mouth 3 (three) times daily.     HYDROcodone -acetaminophen  (NORCO/VICODIN) 5-325 MG tablet Take 1 tablet by mouth every 6 (six) hours as needed for moderate pain (pain score 4-6). 30 tablet 0   ipratropium (ATROVENT  HFA) 17 MCG/ACT inhaler Inhale 2 puffs into the lungs every 6 (six) hours as needed for wheezing. 1 each 1   loratadine  (CLARITIN ) 10 MG tablet Take 10 mg by mouth daily.     Omega-3 Fatty Acids (FISH OIL) 1000 MG CAPS Take 1,000 mg by mouth daily.     prochlorperazine  (COMPAZINE ) 10 MG tablet Take 1 tablet (10 mg total) by mouth every 6 (six) hours as needed for nausea or vomiting. 30 tablet 1   ramipril  (ALTACE ) 2.5 MG tablet Take 2.5 mg by mouth daily.       simvastatin  (ZOCOR ) 40 MG tablet Take 40 mg by mouth at bedtime.       tiZANidine  (ZANAFLEX ) 4 MG tablet Take 2 mg by mouth every 8 (eight) hours as needed for muscle spasms.     traZODone  (DESYREL ) 50 MG tablet Take 50 mg by mouth at bedtime.      venlafaxine  (EFFEXOR ) 75 MG tablet Take 75 mg by mouth daily.     vitamin E  400 UNIT capsule Take 400 Units by mouth daily.      zonisamide  (ZONEGRAN ) 50 MG capsule Take 50-100 mg by mouth See admin instructions. Take 50mg  by mouth in the morning and 100mg  at night.     No current facility-administered medications for this visit.    PHYSICAL EXAMINATION: ECOG PERFORMANCE STATUS: 1 - Symptomatic but completely ambulatory VITALS: Vitals:   06/04/24 1230  BP: 137/74  Pulse: 81  Resp: 20  Temp: (!) 97.3 F (36.3 C)  SpO2: 94%   Filed Weights   06/04/24 1230  Weight: 140 lb 3.2 oz (63.6 kg)   Body mass index is 21.96 kg/m.  GENERAL: alert, in no acute distress and comfortable SKIN: no acute rashes, no significant lesions EYES: conjunctiva are pink and non-injected, sclera anicteric OROPHARYNX: MMM, no exudates, no oropharyngeal erythema or ulceration NECK: supple, no JVD LYMPH:  no palpable lymphadenopathy in the cervical, axillary or inguinal regions LUNGS: clear to auscultation b/l with normal respiratory effort HEART: regular rate & rhythm ABDOMEN:  normoactive bowel sounds , non tender, not distended, no hepatosplenomegaly Extremity: no pedal edema PSYCH: alert & oriented x 3 with fluent speech NEURO: no focal motor/sensory deficits  LABORATORY DATA:   I have reviewed the data as listed     Latest Ref Rng & Units 06/04/2024   11:43 AM 05/21/2024   10:27 AM 05/07/2024    1:37 PM  CBC EXTENDED  WBC 4.0 - 10.5 K/uL 6.3  5.0  4.1   RBC 3.87 - 5.11 MIL/uL 4.71  4.37  4.33   Hemoglobin 12.0 - 15.0 g/dL 85.1  86.4  86.2   HCT 36.0 - 46.0 % 43.1  39.8  39.5   Platelets 150 - 400 K/uL 115  113  117   NEUT# 1.7 - 7.7 K/uL 5.2  4.4  3.2   Lymph# 0.7 - 4.0 K/uL 0.5  0.3  0.4    TSH and T4:  Lab Results  Component Value Date   TSH 0.604 05/21/2024   T4TOTAL 7.3 05/21/2024      Latest Ref Rng & Units 06/04/2024   11:43 AM 05/21/2024   10:27 AM 05/07/2024    1:37 PM  CMP  Glucose 70 - 99 mg/dL 898  94  899   BUN 8 - 23 mg/dL 15  13  12    Creatinine 0.44 - 1.00 mg/dL 9.37  9.42  9.35    Sodium 135 - 145 mmol/L 138  139  141   Potassium 3.5 - 5.1 mmol/L 4.0  4.0  3.4   Chloride 98 - 111 mmol/L 103  106  107   CO2 22 - 32 mmol/L 28  27  29    Calcium  8.9 - 10.3 mg/dL 9.7  9.8  9.0   Total Protein 6.5 - 8.1 g/dL 6.9  6.7  6.6   Total Bilirubin 0.0 - 1.2 mg/dL 0.4  0.4  0.4   Alkaline Phos 38 - 126 U/L 96  87  93   AST 15 - 41 U/L 23  14  15    ALT 0 - 44 U/L 17  11  12     04/22/2024 MOLECULAR PATHOLOGY   RADIOGRAPHIC STUDIES: I have personally reviewed the radiological images as listed and agreed with the findings in the report. CT CHEST ABDOMEN PELVIS W CONTRAST Result Date: 05/31/2024 EXAM: CT CHEST, ABDOMEN AND PELVIS WITH CONTRAST 05/27/2024 08:59:48 AM TECHNIQUE: CT of the chest, abdomen and pelvis was performed with the administration of 100 mL iohexol  (OMNIPAQUE ) 300 MG/ML solution. Multiplanar reformatted images are provided for review. Automated exposure control, iterative reconstruction, and/or weight based adjustment of the mA/kV was utilized to reduce the radiation dose to as low as reasonably achievable. COMPARISON: CT 02/13/2024 and PET CT 12/31/2023. CLINICAL HISTORY: Non-small cell lung cancer (NSCLC), metastatic, assess treatment response; Evaluation of response to treatment for metastatic lung NSCLC. * Tracking Code: BO * FINDINGS: CHEST: MEDIASTINUM AND LYMPH NODES: Heart and pericardium are unremarkable. The central airways are clear. Interval increase in size of right supraclavicular node measuring 3.2 cm compared to 2.2 cm. Right paratracheal lymph node with central necrosis measures 1.5 cm compared to 1.5 cm. No hilar or axillary lymphadenopathy. LUNGS AND PLEURA: Perihilar consolidation in the right upper lobe related to radiation therapy similar to prior. Right upper lobe nodule measuring 6 mm on image 28 of series 6, compared to 5 mm. Right apical nodule along the pleural surface measuring 10 mm compared to 7 mm on image 22. Within the left upper lobe  pulmonary nodule measuring 8 mm on image 57 compared to 7 mm. Medial lingular nodule measuring 5 mm on image 82 compared to 2 mm. No pleural effusion or pneumothorax. ABDOMEN AND PELVIS: LIVER: Small hypodense lesion in the left hepatic lobe is unchanged. No new hepatic lesion. GALLBLADDER AND BILE DUCTS: Gallbladder is unremarkable. No biliary ductal dilatation. SPLEEN: No acute abnormality. PANCREAS: No acute abnormality. ADRENAL GLANDS: Adrenal glands are normal. KIDNEYS, URETERS AND  BLADDER: No stones in the kidneys or ureters. No hydronephrosis. No perinephric or periureteral stranding. Urinary bladder is unremarkable. GI AND BOWEL: Stomach demonstrates no acute abnormality. There is no bowel obstruction. REPRODUCTIVE ORGANS: No acute abnormality. PERITONEUM AND RETROPERITONEUM: No ascites. No free air. VASCULATURE: Aorta is normal in caliber. ABDOMINAL AND PELVIS LYMPH NODES: No lymphadenopathy. BONES AND SOFT TISSUES: Right chest wall port. Bilateral hip prosthetics. No acute osseous abnormality. No focal soft tissue abnormality. IMPRESSION: 1. Interval enlargement of right supraclavicular lymph node, compatible with progressive nodal disease. 2. Stable right paratracheal lymph node with central necrosis. 3. Increasing pulmonary nodules including right apical pleural-based nodule now 10 mm (previously 7 mm), left upper lobe nodule now 8 mm (previously 7 mm), and medial lingular nodule now 5 mm (previously 2 mm), compatible with progressive pulmonary metastatic disease. 4. Perihilar consolidation in the right upper lobe consistent with post-radiation change, unchanged. 5. Small left hepatic lobe hypodense lesion unchanged with no new hepatic lesions. 6. No evidence of adrenal metastases. No skeletal metastasis Electronically signed by: Norleen Boxer MD 05/31/2024 09:34 AM EST RP Workstation: [id number]   DG Shoulder Right Result Date: 05/06/2024 EXAM: 1 VIEW XRAY OF THE RIGHT SHOULDER 05/05/2024 10:36:54 AM  COMPARISON: None available. CLINICAL HISTORY: Shoulder pain for 2 weeks. History of squamous cell lung cancer. X-rays ordered to rule out metastases. FINDINGS: BONES AND JOINTS: Glenohumeral joint is normally aligned. No acute fracture or dislocation. Degenerative subcortical cysts are observed in the humeral head and greater tuberosity region laterally. Moderate glenohumeral and acromioclavicular joint osteoarthritis with associated spurring. Narrowing of the subacromial space consistent with chronic rotator cuff disease. Type 2 subacromial morphology (curved). SOFT TISSUES: Right chest power injectable port in place; tip projects over the right atrium. AICD leads noted. Bandlike opacity in the right upper lung may reflect posttreatment changes. Small adjacent fiducial marker noted. Atheromatous vascular calcification of the aortic arch. IMPRESSION: 1. No acute findings. 2. Moderate glenohumeral joint osteoarthritis with associated spurring. 3. Moderate acromioclavicular joint osteoarthritis with associated spurring. 4. Narrowing of the subacromial space consistent with chronic rotator cuff disease. 5. Degenerative subcortical cysts in the humeral head and greater tuberosity region laterally. 6. Atherosclerosis. 7. Chronic scarring in the upper lung. Electronically signed by: Ryan Salvage MD 05/06/2024 02:20 PM EDT RP Workstation: [id number]   US  LT LOWER EXTREM LTD SOFT TISSUE NON VASCULAR Result Date: 04/30/2024 CLINICAL DATA:  History of metastatic non-small cell lung cancer. Concern for left posterior thigh mass. EXAM: ULTRASOUND left LOWER EXTREMITY LIMITED TECHNIQUE: Ultrasound examination of the lower extremity soft tissues was performed in the area of clinical concern. COMPARISON:  Ultrasound dated 11/21/2010. FINDINGS: Targeted sonographic images of the soft tissues of the posterior left thigh performed. There is a 2.1 x 1.7 x 1.9 cm hypoechoic mass with irregular margins in the soft tissues of  the posterior left thigh suspicious for malignancy or metastatic disease. IMPRESSION: Soft tissue mass in the posterior left thigh concerning for metastatic disease. Electronically Signed   By: Vanetta Chou M.D.   On: 04/30/2024 14:28   DG Chest 2 View Result Date: 03/25/2024 EXAM: 2 VIEW(S) XRAY OF THE CHEST 03/25/2024 01:03:00 PM COMPARISON: 03/17/2024 CLINICAL HISTORY: Wheezing heard on exam. FINDINGS: LINES, TUBES AND DEVICES: Right Port-A-Cath in place with tip in distal SVC. Left chest cardiac leads post generator removal. LUNGS AND PLEURA: Little change in the Poorly marginated coarse airspace opacity in the right suprahilar region extending laterally to the pleural surface as before, with stable fiduciary marker.  No pulmonary edema. No pleural effusion. No pneumothorax. HEART AND MEDIASTINUM: Calcified aorta. No acute abnormality of the cardiac and mediastinal silhouettes. BONES AND SOFT TISSUES: No acute osseous abnormality. IMPRESSION: 1. Stable  right suprahilar lesion, with  fiduciary marker. Electronically signed by: Dayne Hassell MD 03/25/2024 02:24 PM EDT RP Workstation: [id number]   DG Chest Port 1 View Result Date: 03/17/2024 CLINICAL DATA:  Status post bronchoscopy with biopsy. EXAM: PORTABLE CHEST 1 VIEW COMPARISON:  08/07/2023 and CT chest 02/13/2024. FINDINGS: Right IJ Port-A-Cath tip is in the SVC. Pacemaker and ICD lead tips are in the right atrium and right ventricle, respectively. Heart size normal. Thoracic aorta is calcified. Patchy airspace opacification in the right suprahilar region, with a pre-existing fiducial marker. Lungs are otherwise clear. No definite pneumothorax. IMPRESSION: 1. No definite pneumothorax status post bronchoscopy and biopsy. 2. Similar patchy airspace opacification in the right upper lobe, possibly radiation related. Electronically Signed   By: Newell Eke M.D.   On: 03/17/2024 14:17   DG C-ARM BRONCHOSCOPY Result Date: 03/17/2024 C-ARM  BRONCHOSCOPY: Fluoroscopy was utilized by the requesting physician.  No radiographic interpretation.   DG C-Arm 1-60 Min-No Report Result Date: 03/17/2024 Fluoroscopy was utilized by the requesting physician.  No radiographic interpretation.   EP PPM/ICD IMPLANT Result Date: 03/14/2024 Conclusion: Successful removal of her previously implanted dual-chamber ICD with relocation of the atrial and ICD leads to a subpectoral location. Dr. Adina Primus, MD was the co-surgeon during the procedure. Danelle Birmingham, MD    ASSESSMENT & PLAN:   Rachel Bates is 78 y.o.  female who presents for a follow up for continued management of squamous cell carcinoma of the lung.    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 Completed radiation therapy on 11/02/2023.  --Started Cycle 1, Day 1 of Durvalumab  on 09/18/2023.   --CT Head from 12/31/2023 showed no intracranial metastatic disease --PET scan from 12/31/2023 showed previous RUL perihilar mass and previously seen nodes in the right hilum in the mid mediastinum appears smaller and shows less uptake. There are new hypermetabolic nodes more superiorly in the right side of the mediastinum along with LUL nodular area with more uptake.  --CT chest from 02/13/2024 continued decreased in size of treated mass in the anterior perihilar RUL.Interval increase in size of nodules throughout the right apex.Unchanged clustered nodules in the peripheral posterior left upper lobe.Interval enlargement of high right pretracheal, paratracheal, and supraclavicular nodes, previously FDG avid, consistent with worsened nodal metastatic disease. 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    PLAN: - Discussed lab results on 06/04/2024 in detail with patient: CBC showed WBC of 6.3K, Hemoglobin of 14.8, PLTs of 115K increased from 113K, and Lymphocytes of 0.5K increased from 0.3K CMP stable.  - Reviewed 05/27/2024 CT A/P:  1. Interval enlargement of right supraclavicular lymph node, compatible with progressive nodal disease.    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  2. Stable right paratracheal lymph node with central necrosis.  3. Increasing pulmonary nodules including right apical pleural-based nodule  now 10 mm (previously 7 mm), left upper lobe nodule now 8 mm (previously 7 mm), and medial lingular nodule now 5 mm (previously 2 mm), compatible with progressive pulmonary metastatic disease.  4. Perihilar consolidation in the right upper lobe consistent with post-radiation change, unchanged.  5. Small left hepatic lobe hypodense lesion unchanged with no new hepatic lesions.  6. No evidence of adrenal metastases. No skeletal metastasis     There does seem to be some degree of progression, but none in the abdomen or bones.   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 We discussed that there would be an option to consider palliative RT to rt supraclavicular mass -- patient wants to hold off on this.   Discussed further radiation treatment, combination immunotherapy, chemotherapy, vs BSC Patient chooses to proceed with Dual Immune check point inhibitor therapy. Ordered Ipi/Nivo as 1st line palliative treatment for  No new evidence of disease progression noted in areas where radiation treatment was performed.   - Discussed that pinched nerve may be contribute to lower  extremity tingling/numbness.   - Refilling Hydrocodone  for pain relief.  - Reviewed C-spine CT ordered by Dr. Smith, provided by patient. This showed degenerative disease and the supraclavicular nodule noted on A/P CT.  FOLLOW-UP Discontinue Durvalumab  Plz schedule to start Ipi/Nivolumab ASAP plz schedule 1st 2 cycles MD visit with c1D15 for toxicity check  The total time spent in the appointment was 40 minutes* .  All of the patient's questions were answered and the patient knows to call the clinic with any problems, questions, or concerns.  Emaline Saran MD MS AAHIVMS [facility] Hematology/Oncology Physician [facility]  *Total Encounter Time as defined by the Centers for Medicare and Medicaid Services includes, in addition to the face-to-face time of a patient visit (documented in the note above) non-face-to-face time: obtaining and reviewing outside history, ordering and reviewing medications, tests or procedures, care coordination (communications with other health care professionals or caregivers) and documentation in the medical record.  I,Emily Lagle,acting as a neurosurgeon for Emaline Saran, MD.,have documented all relevant documentation on the behalf of Emaline Saran, MD,as directed by  Emaline Saran, MD while in the presence of Emaline Saran, MD.  I have reviewed the above documentation for accuracy and completeness, and I agree with the above.  Rehana Uncapher, MD

## 2024-06-05 LAB — T4: T4, Total: 6.9 ug/dL (ref 4.5–12.0)

## 2024-06-06 ENCOUNTER — Ambulatory Visit (HOSPITAL_COMMUNITY)
Admission: RE | Admit: 2024-06-06 | Discharge: 2024-06-06 | Disposition: A | Source: Ambulatory Visit | Attending: Hematology

## 2024-06-06 DIAGNOSIS — C833A Diffuse large b-cell lymphoma, in remission: Secondary | ICD-10-CM | POA: Insufficient documentation

## 2024-06-06 DIAGNOSIS — C4922 Malignant neoplasm of connective and soft tissue of left lower limb, including hip: Secondary | ICD-10-CM | POA: Diagnosis not present

## 2024-06-06 DIAGNOSIS — Z85118 Personal history of other malignant neoplasm of bronchus and lung: Secondary | ICD-10-CM | POA: Diagnosis not present

## 2024-06-06 DIAGNOSIS — M25511 Pain in right shoulder: Secondary | ICD-10-CM | POA: Diagnosis not present

## 2024-06-06 DIAGNOSIS — M542 Cervicalgia: Secondary | ICD-10-CM | POA: Diagnosis not present

## 2024-06-06 DIAGNOSIS — M7989 Other specified soft tissue disorders: Secondary | ICD-10-CM

## 2024-06-06 DIAGNOSIS — M25611 Stiffness of right shoulder, not elsewhere classified: Secondary | ICD-10-CM | POA: Diagnosis not present

## 2024-06-06 MED ORDER — LIDOCAINE HCL (PF) 1 % IJ SOLN
10.0000 mL | Freq: Once | INTRAMUSCULAR | Status: AC
  Administered 2024-06-06: 10 mL via INTRADERMAL

## 2024-06-06 NOTE — Procedures (Signed)
 Pre procedural Dx: Leg nodule  Post procedural Dx: Same  Technically successful UST guided biopsy of posterior left leg nodule.   EBL: None.   Complications: None immediate.   KANDICE Banner, MD Pager #: [phone]

## 2024-06-09 LAB — SURGICAL PATHOLOGY

## 2024-06-11 ENCOUNTER — Encounter: Payer: Self-pay | Admitting: Hematology

## 2024-06-17 DIAGNOSIS — Z6820 Body mass index (BMI) 20.0-20.9, adult: Secondary | ICD-10-CM | POA: Diagnosis not present

## 2024-06-17 DIAGNOSIS — C3490 Malignant neoplasm of unspecified part of unspecified bronchus or lung: Secondary | ICD-10-CM | POA: Diagnosis not present

## 2024-06-17 DIAGNOSIS — C8590 Non-Hodgkin lymphoma, unspecified, unspecified site: Secondary | ICD-10-CM | POA: Diagnosis not present

## 2024-06-17 DIAGNOSIS — N182 Chronic kidney disease, stage 2 (mild): Secondary | ICD-10-CM | POA: Diagnosis not present

## 2024-06-17 DIAGNOSIS — R110 Nausea: Secondary | ICD-10-CM | POA: Diagnosis not present

## 2024-06-18 ENCOUNTER — Ambulatory Visit: Admitting: Internal Medicine

## 2024-06-20 ENCOUNTER — Inpatient Hospital Stay

## 2024-06-20 NOTE — Progress Notes (Signed)
 Pharmacist Chemotherapy Monitoring - Initial Assessment    Anticipated start date: 06/27/24   The following has been reviewed per standard work regarding the patient's treatment regimen: The patient's diagnosis, treatment plan and drug doses, and organ/hematologic function Lab orders and baseline tests specific to treatment regimen  The treatment plan start date, drug sequencing, and pre-medications Prior authorization status  Patient's documented medication list, including drug-drug interaction screen and prescriptions for anti-emetics and supportive care specific to the treatment regimen The drug concentrations, fluid compatibility, administration routes, and timing of the medications to be used The patient's access for treatment and lifetime cumulative dose history, if applicable  The patient's medication allergies and previous infusion related reactions, if applicable   Changes made to treatment plan:  treatment plan date  Follow up needed:  Pending authorization for treatment   Harlene JONELLE Nasuti, RPH, 06/20/2024  4:21 PM

## 2024-06-26 ENCOUNTER — Encounter: Payer: Self-pay | Admitting: Hematology

## 2024-06-27 ENCOUNTER — Inpatient Hospital Stay: Attending: Hematology

## 2024-06-27 ENCOUNTER — Inpatient Hospital Stay

## 2024-07-07 ENCOUNTER — Telehealth: Payer: Self-pay

## 2024-07-07 NOTE — Telephone Encounter (Signed)
 Pt called in to cancel appt because she has been sick this will need to be rescheduled

## 2024-07-08 NOTE — Telephone Encounter (Signed)
 Spoke w/ patient - she is rescheduled to see [place], [place] on 1/26.

## 2024-07-09 ENCOUNTER — Ambulatory Visit: Admitting: Internal Medicine

## 2024-07-11 ENCOUNTER — Inpatient Hospital Stay: Admitting: Physician Assistant

## 2024-07-11 ENCOUNTER — Inpatient Hospital Stay: Attending: Hematology

## 2024-07-11 ENCOUNTER — Inpatient Hospital Stay

## 2024-07-17 ENCOUNTER — Inpatient Hospital Stay: Admitting: Hematology

## 2024-07-17 ENCOUNTER — Emergency Department (HOSPITAL_COMMUNITY)

## 2024-07-17 ENCOUNTER — Inpatient Hospital Stay (HOSPITAL_COMMUNITY)
Admission: EM | Admit: 2024-07-17 | Discharge: 2024-07-26 | DRG: 391 | Disposition: A | Discharge status: Hospice | Source: Ambulatory Visit | Attending: Internal Medicine | Admitting: Internal Medicine

## 2024-07-17 ENCOUNTER — Other Ambulatory Visit: Payer: Self-pay | Admitting: Pharmacist

## 2024-07-17 ENCOUNTER — Inpatient Hospital Stay: Attending: Hematology

## 2024-07-17 ENCOUNTER — Other Ambulatory Visit: Payer: Self-pay

## 2024-07-17 VITALS — BP 117/65 | HR 87 | Temp 97.7°F | Resp 18 | Wt 128.4 lb

## 2024-07-17 DIAGNOSIS — Z86711 Personal history of pulmonary embolism: Secondary | ICD-10-CM

## 2024-07-17 DIAGNOSIS — Z66 Do not resuscitate: Secondary | ICD-10-CM | POA: Diagnosis present

## 2024-07-17 DIAGNOSIS — Z8249 Family history of ischemic heart disease and other diseases of the circulatory system: Secondary | ICD-10-CM

## 2024-07-17 DIAGNOSIS — C8338 Diffuse large B-cell lymphoma, lymph nodes of multiple sites: Secondary | ICD-10-CM | POA: Diagnosis present

## 2024-07-17 DIAGNOSIS — Z6820 Body mass index (BMI) 20.0-20.9, adult: Secondary | ICD-10-CM

## 2024-07-17 DIAGNOSIS — R54 Age-related physical debility: Secondary | ICD-10-CM | POA: Diagnosis present

## 2024-07-17 DIAGNOSIS — R634 Abnormal weight loss: Secondary | ICD-10-CM | POA: Diagnosis not present

## 2024-07-17 DIAGNOSIS — K222 Esophageal obstruction: Principal | ICD-10-CM | POA: Diagnosis present

## 2024-07-17 DIAGNOSIS — C3491 Malignant neoplasm of unspecified part of right bronchus or lung: Secondary | ICD-10-CM

## 2024-07-17 DIAGNOSIS — F32A Depression, unspecified: Secondary | ICD-10-CM | POA: Diagnosis present

## 2024-07-17 DIAGNOSIS — I110 Hypertensive heart disease with heart failure: Secondary | ICD-10-CM | POA: Diagnosis present

## 2024-07-17 DIAGNOSIS — Z515 Encounter for palliative care: Secondary | ICD-10-CM

## 2024-07-17 DIAGNOSIS — Z86718 Personal history of other venous thrombosis and embolism: Secondary | ICD-10-CM

## 2024-07-17 DIAGNOSIS — I252 Old myocardial infarction: Secondary | ICD-10-CM

## 2024-07-17 DIAGNOSIS — Z7189 Other specified counseling: Secondary | ICD-10-CM

## 2024-07-17 DIAGNOSIS — Z79899 Other long term (current) drug therapy: Secondary | ICD-10-CM | POA: Insufficient documentation

## 2024-07-17 DIAGNOSIS — I2510 Atherosclerotic heart disease of native coronary artery without angina pectoris: Secondary | ICD-10-CM | POA: Diagnosis present

## 2024-07-17 DIAGNOSIS — R590 Localized enlarged lymph nodes: Secondary | ICD-10-CM | POA: Diagnosis present

## 2024-07-17 DIAGNOSIS — E43 Unspecified severe protein-calorie malnutrition: Secondary | ICD-10-CM | POA: Diagnosis present

## 2024-07-17 DIAGNOSIS — Z85118 Personal history of other malignant neoplasm of bronchus and lung: Secondary | ICD-10-CM

## 2024-07-17 DIAGNOSIS — Z85828 Personal history of other malignant neoplasm of skin: Secondary | ICD-10-CM

## 2024-07-17 DIAGNOSIS — D696 Thrombocytopenia, unspecified: Secondary | ICD-10-CM | POA: Diagnosis present

## 2024-07-17 DIAGNOSIS — G893 Neoplasm related pain (acute) (chronic): Secondary | ICD-10-CM

## 2024-07-17 DIAGNOSIS — K449 Diaphragmatic hernia without obstruction or gangrene: Secondary | ICD-10-CM | POA: Diagnosis present

## 2024-07-17 DIAGNOSIS — E785 Hyperlipidemia, unspecified: Secondary | ICD-10-CM | POA: Diagnosis present

## 2024-07-17 DIAGNOSIS — Z95810 Presence of automatic (implantable) cardiac defibrillator: Secondary | ICD-10-CM

## 2024-07-17 DIAGNOSIS — C7951 Secondary malignant neoplasm of bone: Secondary | ICD-10-CM | POA: Diagnosis present

## 2024-07-17 DIAGNOSIS — Z96653 Presence of artificial knee joint, bilateral: Secondary | ICD-10-CM | POA: Diagnosis present

## 2024-07-17 DIAGNOSIS — C787 Secondary malignant neoplasm of liver and intrahepatic bile duct: Secondary | ICD-10-CM | POA: Diagnosis present

## 2024-07-17 DIAGNOSIS — J9601 Acute respiratory failure with hypoxia: Secondary | ICD-10-CM | POA: Diagnosis not present

## 2024-07-17 DIAGNOSIS — I480 Paroxysmal atrial fibrillation: Secondary | ICD-10-CM | POA: Diagnosis present

## 2024-07-17 DIAGNOSIS — C3411 Malignant neoplasm of upper lobe, right bronchus or lung: Secondary | ICD-10-CM | POA: Insufficient documentation

## 2024-07-17 DIAGNOSIS — R109 Unspecified abdominal pain: Secondary | ICD-10-CM

## 2024-07-17 DIAGNOSIS — Z87891 Personal history of nicotine dependence: Secondary | ICD-10-CM

## 2024-07-17 DIAGNOSIS — Z955 Presence of coronary angioplasty implant and graft: Secondary | ICD-10-CM

## 2024-07-17 DIAGNOSIS — Z96643 Presence of artificial hip joint, bilateral: Secondary | ICD-10-CM | POA: Diagnosis present

## 2024-07-17 DIAGNOSIS — R112 Nausea with vomiting, unspecified: Secondary | ICD-10-CM

## 2024-07-17 DIAGNOSIS — Z7901 Long term (current) use of anticoagulants: Secondary | ICD-10-CM

## 2024-07-17 DIAGNOSIS — Z888 Allergy status to other drugs, medicaments and biological substances status: Secondary | ICD-10-CM

## 2024-07-17 DIAGNOSIS — F411 Generalized anxiety disorder: Secondary | ICD-10-CM

## 2024-07-17 DIAGNOSIS — D72819 Decreased white blood cell count, unspecified: Secondary | ICD-10-CM | POA: Diagnosis present

## 2024-07-17 DIAGNOSIS — Z9221 Personal history of antineoplastic chemotherapy: Secondary | ICD-10-CM

## 2024-07-17 DIAGNOSIS — G4700 Insomnia, unspecified: Secondary | ICD-10-CM | POA: Diagnosis present

## 2024-07-17 DIAGNOSIS — I255 Ischemic cardiomyopathy: Secondary | ICD-10-CM | POA: Diagnosis present

## 2024-07-17 DIAGNOSIS — K2950 Unspecified chronic gastritis without bleeding: Secondary | ICD-10-CM | POA: Diagnosis present

## 2024-07-17 DIAGNOSIS — R1310 Dysphagia, unspecified: Principal | ICD-10-CM

## 2024-07-17 DIAGNOSIS — I2699 Other pulmonary embolism without acute cor pulmonale: Secondary | ICD-10-CM | POA: Diagnosis present

## 2024-07-17 DIAGNOSIS — F419 Anxiety disorder, unspecified: Secondary | ICD-10-CM | POA: Diagnosis present

## 2024-07-17 DIAGNOSIS — Z79620 Long term (current) use of immunosuppressive biologic: Secondary | ICD-10-CM | POA: Insufficient documentation

## 2024-07-17 DIAGNOSIS — R1319 Other dysphagia: Secondary | ICD-10-CM

## 2024-07-17 DIAGNOSIS — W44F3XA Food entering into or through a natural orifice, initial encounter: Secondary | ICD-10-CM | POA: Diagnosis present

## 2024-07-17 DIAGNOSIS — R64 Cachexia: Secondary | ICD-10-CM | POA: Diagnosis present

## 2024-07-17 DIAGNOSIS — Z711 Person with feared health complaint in whom no diagnosis is made: Secondary | ICD-10-CM

## 2024-07-17 DIAGNOSIS — Z923 Personal history of irradiation: Secondary | ICD-10-CM

## 2024-07-17 DIAGNOSIS — Z1152 Encounter for screening for COVID-19: Secondary | ICD-10-CM

## 2024-07-17 DIAGNOSIS — I5023 Acute on chronic systolic (congestive) heart failure: Secondary | ICD-10-CM | POA: Diagnosis present

## 2024-07-17 DIAGNOSIS — Z681 Body mass index (BMI) 19 or less, adult: Secondary | ICD-10-CM

## 2024-07-17 DIAGNOSIS — K5900 Constipation, unspecified: Secondary | ICD-10-CM | POA: Diagnosis not present

## 2024-07-17 LAB — CMP (CANCER CENTER ONLY)
ALT: 15 U/L (ref 0–44)
AST: 24 U/L (ref 15–41)
Albumin: 4.1 g/dL (ref 3.5–5.0)
Alkaline Phosphatase: 96 U/L (ref 38–126)
Anion gap: 10 (ref 5–15)
BUN: 8 mg/dL (ref 8–23)
CO2: 26 mmol/L (ref 22–32)
Calcium: 9.8 mg/dL (ref 8.9–10.3)
Chloride: 101 mmol/L (ref 98–111)
Creatinine: 0.53 mg/dL (ref 0.44–1.00)
GFR, Estimated: >60 mL/min
Glucose, Bld: 113 mg/dL — ABNORMAL HIGH (ref 70–99)
Potassium: 3.6 mmol/L (ref 3.5–5.1)
Sodium: 137 mmol/L (ref 135–145)
Total Bilirubin: 1.1 mg/dL (ref 0.0–1.2)
Total Protein: 6.8 g/dL (ref 6.5–8.1)

## 2024-07-17 LAB — CBC WITH DIFFERENTIAL (CANCER CENTER ONLY)
Abs Immature Granulocytes: 0.01 K/uL (ref 0.00–0.07)
Basophils Absolute: 0 K/uL (ref 0.0–0.1)
Basophils Relative: 0 %
Eosinophils Absolute: 0 K/uL (ref 0.0–0.5)
Eosinophils Relative: 1 %
HCT: 43.7 % (ref 36.0–46.0)
Hemoglobin: 15.2 g/dL — ABNORMAL HIGH (ref 12.0–15.0)
Immature Granulocytes: 0 %
Lymphocytes Relative: 10 %
Lymphs Abs: 0.4 K/uL — ABNORMAL LOW (ref 0.7–4.0)
MCH: 31 pg (ref 26.0–34.0)
MCHC: 34.8 g/dL (ref 30.0–36.0)
MCV: 89 fL (ref 80.0–100.0)
Monocytes Absolute: 0.4 K/uL (ref 0.1–1.0)
Monocytes Relative: 10 %
Neutro Abs: 3.6 K/uL (ref 1.7–7.7)
Neutrophils Relative %: 79 %
Platelet Count: 148 K/uL — ABNORMAL LOW (ref 150–400)
RBC: 4.91 MIL/uL (ref 3.87–5.11)
RDW: 13.6 % (ref 11.5–15.5)
WBC Count: 4.5 K/uL (ref 4.0–10.5)
nRBC: 0 % (ref 0.0–0.2)

## 2024-07-17 LAB — TSH: TSH: 1.64 u[IU]/mL (ref 0.350–4.500)

## 2024-07-17 LAB — RESP PANEL BY RT-PCR (RSV, FLU A&B, COVID)  RVPGX2
Influenza A by PCR: NEGATIVE
Influenza B by PCR: NEGATIVE
Resp Syncytial Virus by PCR: NEGATIVE
SARS Coronavirus 2 by RT PCR: NEGATIVE

## 2024-07-17 LAB — PHOSPHORUS: Phosphorus: 3.2 mg/dL (ref 2.5–4.6)

## 2024-07-17 LAB — MAGNESIUM: Magnesium: 2.1 mg/dL (ref 1.7–2.4)

## 2024-07-17 MED ORDER — MORPHINE SULFATE (PF) 2 MG/ML IV SOLN
1.0000 mg | INTRAVENOUS | Status: DC | PRN
  Administered 2024-07-17 – 2024-07-19 (×5): 1 mg via INTRAVENOUS
  Filled 2024-07-17 (×5): qty 1

## 2024-07-17 MED ORDER — PANTOPRAZOLE SODIUM 40 MG IV SOLR
40.0000 mg | INTRAVENOUS | Status: DC
  Administered 2024-07-17 – 2024-07-25 (×9): 40 mg via INTRAVENOUS
  Filled 2024-07-17 (×9): qty 10

## 2024-07-17 MED ORDER — IOHEXOL 350 MG/ML SOLN
100.0000 mL | Freq: Once | INTRAVENOUS | Status: AC | PRN
  Administered 2024-07-17: 100 mL via INTRAVENOUS

## 2024-07-17 MED ORDER — ACETAMINOPHEN 325 MG PO TABS
650.0000 mg | ORAL_TABLET | Freq: Four times a day (QID) | ORAL | Status: DC | PRN
  Filled 2024-07-17: qty 2

## 2024-07-17 MED ORDER — ACETAMINOPHEN 650 MG RE SUPP: 650.0000 mg | Freq: Four times a day (QID) | RECTAL | Status: DC | PRN

## 2024-07-17 MED ORDER — SODIUM CHLORIDE 0.9 % IV BOLUS
1000.0000 mL | Freq: Once | INTRAVENOUS | Status: AC
  Administered 2024-07-17: 1000 mL via INTRAVENOUS

## 2024-07-17 MED ORDER — ONDANSETRON HCL 4 MG/2ML IJ SOLN
4.0000 mg | Freq: Once | INTRAMUSCULAR | Status: AC
  Administered 2024-07-17: 4 mg via INTRAVENOUS
  Filled 2024-07-17: qty 2

## 2024-07-17 NOTE — ED Provider Notes (Signed)
 " [place] EMERGENCY DEPARTMENT AT [facility] Provider Note   CSN: [id number] Arrival date & time: 07/17/24  8397     Patient presents with: Dysphagia   Rachel Bates is a 78 y.o. female.   The history is provided by the patient, medical records and a relative. No language interpreter was used.  Illness Location:  Abd pain, wt loss, cant swallow Severity:  Severe Onset quality:  Gradual Duration:  3 weeks Timing:  Constant Progression:  Worsening Associated symptoms: abdominal pain, cough, fatigue, nausea and vomiting   Associated symptoms: no chest pain, no congestion, no diarrhea, no fever, no headaches, no rash, no shortness of breath and no wheezing        Prior to Admission medications  Medication Sig Start Date End Date Taking? Authorizing Provider  apixaban  (ELIQUIS ) 5 MG TABS tablet Take 1 tablet (5 mg total) by mouth 2 (two) times daily. 04/14/24   Waddell Danelle ORN, MD  b complex vitamins capsule Take 1 capsule by mouth daily.    [provider]  Calcium  Carb-Cholecalciferol  (CALCIUM  600 + D PO) Take 1 tablet by mouth daily.    [provider]  carvedilol  (COREG ) 12.5 MG tablet Take 12.5 mg by mouth 2 (two) times daily with a meal.     [provider]  Cholecalciferol  (VITAMIN D3) 125 MCG (5000 UT) capsule Take 5,000 Units by mouth daily.    [provider]  cyanocobalamin  (VITAMIN B12) 1000 MCG tablet Take 1,000 mcg by mouth daily.    [provider]  FIBER PO Take 1 capsule by mouth 2 (two) times daily.    [provider]  gabapentin  (NEURONTIN ) 300 MG capsule Take 300 mg by mouth 3 (three) times daily. 04/09/20   [provider]  HYDROcodone -acetaminophen  (NORCO/VICODIN) 5-325 MG tablet Take 1 tablet by mouth every 6 (six) hours as needed for moderate pain (pain score 4-6). 06/04/24   Kale, Gautam Kishore, MD  ipratropium (ATROVENT  HFA) 17 MCG/ACT inhaler Inhale 2 puffs into the lungs every 6  (six) hours as needed for wheezing. 04/25/24   Kale, Gautam Kishore, MD  loratadine  (CLARITIN ) 10 MG tablet Take 10 mg by mouth daily.    [provider]  Omega-3 Fatty Acids (FISH OIL) 1000 MG CAPS Take 1,000 mg by mouth daily.    [provider]  ramipril  (ALTACE ) 2.5 MG tablet Take 2.5 mg by mouth daily.      [provider]  simvastatin  (ZOCOR ) 40 MG tablet Take 40 mg by mouth at bedtime.      [provider]  tiZANidine  (ZANAFLEX ) 4 MG tablet Take 2 mg by mouth every 8 (eight) hours as needed for muscle spasms. 01/12/23   [provider]  traZODone  (DESYREL ) 50 MG tablet Take 50 mg by mouth at bedtime.     [provider]  venlafaxine  (EFFEXOR ) 75 MG tablet Take 75 mg by mouth daily. 03/06/21   [provider]  vitamin E  400 UNIT capsule Take 400 Units by mouth daily.    [provider]  zonisamide  (ZONEGRAN ) 50 MG capsule Take 50-100 mg by mouth See admin instructions. Take 50mg  by mouth in the morning and 100mg  at night. 09/13/17   [provider]    Allergies: Sumatriptan succinate    Review of Systems  Constitutional:  Positive for fatigue. Negative for chills and fever.  HENT:  Positive for trouble swallowing. Negative for congestion.   Eyes:  Negative for visual disturbance.  Respiratory:  Positive for cough. Negative for shortness of breath and wheezing.   Cardiovascular:  Negative for chest pain.  Gastrointestinal:  Positive for abdominal pain, nausea and vomiting. Negative for constipation and diarrhea.  Genitourinary:  Negative for dysuria and flank pain.  Musculoskeletal:  Negative for back pain, neck pain and neck stiffness.  Skin:  Negative for rash and wound.  Neurological:  Negative for speech difficulty, light-headedness and headaches.  Psychiatric/Behavioral:  Negative for agitation and confusion.   All other systems reviewed and are negative.   Updated Vital Signs BP 139/73   Pulse 80    Temp 98.7 F (37.1 C) (Oral)   Resp 19   Ht 5' 7 (1.702 m)   Wt 58.2 kg   SpO2 97%   BMI 20.11 kg/m   Physical Exam Vitals and nursing note reviewed.  Constitutional:      General: She is not in acute distress.    Appearance: She is well-developed. She is not ill-appearing, toxic-appearing or diaphoretic.  HENT:     Head: Normocephalic and atraumatic.     Nose: No congestion or rhinorrhea.     Mouth/Throat:     Mouth: Mucous membranes are dry.     Pharynx: No oropharyngeal exudate or posterior oropharyngeal erythema.  Eyes:     Extraocular Movements: Extraocular movements intact.     Conjunctiva/sclera: Conjunctivae normal.     Pupils: Pupils are equal, round, and reactive to light.  Cardiovascular:     Rate and Rhythm: Normal rate and regular rhythm.     Heart sounds: No murmur heard. Pulmonary:     Effort: Pulmonary effort is normal. No respiratory distress.     Breath sounds: Rhonchi present. No wheezing or rales.  Chest:     Chest wall: No tenderness.  Abdominal:     General: Abdomen is flat.     Palpations: Abdomen is soft.     Tenderness: There is abdominal tenderness. There is no right CVA tenderness, left CVA tenderness, guarding or rebound.  Musculoskeletal:        General: No swelling or tenderness.     Cervical back: Neck supple. No tenderness.  Skin:    General: Skin is warm and dry.     Capillary Refill: Capillary refill takes less than 2 seconds.     Findings: No erythema or rash.  Neurological:     General: No focal deficit present.     Mental Status: She is alert.     Sensory: No sensory deficit.     Motor: No weakness.  Psychiatric:        Mood and Affect: Mood normal.     (all labs ordered are listed, but only abnormal results are displayed) Labs Reviewed  RESP PANEL BY RT-PCR (RSV, FLU A&B, COVID)  [id number]  URINALYSIS, W/ REFLEX TO CULTURE (INFECTION SUSPECTED)  BASIC METABOLIC PANEL WITH GFR  CBC  PROTIME-INR     EKG: None  Radiology: CT ABDOMEN PELVIS W CONTRAST Result Date: 07/17/2024 EXAM: CTA CHEST PE WITH AND WITHOUT CONTRAST CT ABDOMEN AND PELVIS WITH CONTRAST 07/17/2024 06:06:12 PM TECHNIQUE: CTA of the chest was performed after the administration of 100 mL of iohexol  (OMNIPAQUE ) 350 MG/ML injection. Multiplanar reformatted images are provided for review. MIP images are provided for review. CT of the abdomen and pelvis was performed with the administration of intravenous contrast. Automated exposure control, iterative reconstruction, and/or weight based adjustment of the mA/kV was utilized to reduce the radiation dose to as low as  reasonably achievable. COMPARISON: CT chest abdomen and pelvis 05/27/2024. CLINICAL HISTORY: Abdominal pain, nausea, vomiting, cancer, weight loss. Pulmonary embolus suspected with high probability. Feels like something in the chest. Difficulty swallowing. FINDINGS: CHEST: PULMONARY ARTERIES: Pulmonary arteries are adequately opacified for evaluation. No intraluminal filling defect to suggest pulmonary embolism. Main pulmonary artery is normal in caliber. MEDIASTINUM: Right central venous catheter with tip in the SVC. Normal heart size. No pericardial effusions. Air-fluid level and debris in the esophagus with esophageal dilatation down to the level of about T8 with distal decompression of the esophagus. This is concerning for esophageal obstruction, possibly due to mass, stricture, or extrinsic compression. Right supraclavicular lymphadenopathy, today measuring 2.6 cm in diameter. Right paratracheal lymphadenopathy measuring up to 1.2 cm short axis. Developing right hilar mass or lymphadenopathy measuring 2.4 cm diameter. There is no acute abnormality of the thoracic aorta. LUNGS AND PLEURA: Consolidation in the right middle lung likely representing postobstructive change. Pneumonia would be a less likely consideration. Multiple pulmonary nodules are demonstrated throughout the  lungs. Largest are in the right lung bases measuring up to 1.5 x 0.9 cm. Pulmonary nodularity is new since the prior study. 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 No pleural effusion or pneumothorax. SOFT TISSUES AND BONES: Degenerative changes in the spine. Expansile soft tissue lesion involving the right anterior 7th rib, enlarging since prior study, consistent with bone metastasis. ABDOMEN AND PELVIS: LIVER: Diffuse fatty infiltration of the liver. Multiple heterogeneous hypoenhancing liver lesions consistent with metastatic disease. The largest is in segment 5 measuring 3 cm diameter. This is progressing since the previous study. GALLBLADDER AND BILE DUCTS: Gallbladder is unremarkable. No biliary ductal dilatation. SPLEEN: Spleen demonstrates no acute abnormality. PANCREAS: Pancreas demonstrates no acute abnormality. ADRENAL GLANDS: Adrenal glands demonstrate no acute abnormality. KIDNEYS, URETERS AND BLADDER: No stones in the kidneys or ureters. No hydronephrosis. No perinephric or periureteral stranding. The bladder is obscured by streak artifact from hip arthroplasties. GI AND BOWEL: Stomach and duodenal sweep demonstrate no acute abnormality. There is no bowel obstruction. No abnormal bowel wall thickening or distension. Stool throughout the colon. The appendix is normal. REPRODUCTIVE: The uterus and ovaries are not enlarged. PERITONEUM AND RETROPERITONEUM: No ascites or free air. LYMPH NODES: No retroperitoneal lymphadenopathy. 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 Streak artifact from the arthroplasties limits visualization of the low pelvis. Degenerative changes in the lumbar spine with mild lumbar scoliosis convex towards the right. Calcification of the abdominal aorta. No focal soft tissue abnormality. IMPRESSION: 1. No pulmonary embolism. 2. Esophageal  obstruction with air-fluid level and debris, possibly due to mass, stricture, or extrinsic compression. 3. Right supraclavicular, mediastinal, and right hilar lymphadenopathy. , concerning for malignancy. 4. Right hilar mass with right middle lobe consolidation, likely postobstructive change. 5. 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 6. Multiple heterogeneous hypoenhancing liver lesions consistent with metastatic disease, progressing since the previous study. 7. Expansile soft tissue lesion involving the right anterior 7th rib, enlarging since prior study, consistent with bone metastasis. Electronically signed by: Elsie Gravely MD 07/17/2024 06:24 PM EST RP Workstation: [id number]   CT Angio Chest PE W and/or Wo Contrast Result Date: 07/17/2024 EXAM: CTA CHEST PE WITH AND WITHOUT CONTRAST CT ABDOMEN AND PELVIS WITH CONTRAST 07/17/2024 06:06:12 PM TECHNIQUE: CTA of the chest was performed after the administration of 100 mL of iohexol  (OMNIPAQUE ) 350 MG/ML injection. Multiplanar  reformatted images are provided for review. MIP images are provided for review. CT of the abdomen and pelvis was performed with the administration of intravenous contrast. Automated exposure control, iterative reconstruction, and/or weight based adjustment of the mA/kV was utilized to reduce the radiation dose to as low as reasonably achievable. COMPARISON: CT chest abdomen and pelvis 05/27/2024. CLINICAL HISTORY: Abdominal pain, nausea, vomiting, cancer, weight loss. Pulmonary embolus suspected with high probability. Feels like something in the chest. Difficulty swallowing. FINDINGS: CHEST: PULMONARY ARTERIES: Pulmonary arteries are adequately opacified for evaluation. No intraluminal filling defect to suggest pulmonary embolism. Main pulmonary artery is normal in caliber. MEDIASTINUM: Right central venous catheter with tip in the SVC. Normal heart size. No pericardial  effusions. Air-fluid level and debris in the esophagus with esophageal dilatation down to the level of about T8 with distal decompression of the esophagus. This is concerning for esophageal obstruction, possibly due to mass, stricture, or extrinsic compression. Right supraclavicular lymphadenopathy, today measuring 2.6 cm in diameter. Right paratracheal lymphadenopathy measuring up to 1.2 cm short axis. Developing right hilar mass or lymphadenopathy measuring 2.4 cm diameter. There is no acute abnormality of the thoracic aorta. LUNGS AND PLEURA: Consolidation in the right middle lung likely representing postobstructive change. Pneumonia would be a less likely consideration. Multiple pulmonary nodules are demonstrated throughout the lungs. Largest are in the right lung bases measuring up to 1.5 x 0.9 cm. Pulmonary nodularity is new since the prior study. 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 No pleural effusion or pneumothorax. SOFT TISSUES AND BONES: Degenerative changes in the spine. Expansile soft tissue lesion involving the right anterior 7th rib, enlarging since prior study, consistent with bone metastasis. ABDOMEN AND PELVIS: LIVER: Diffuse fatty infiltration of the liver. Multiple heterogeneous hypoenhancing liver lesions consistent with metastatic disease. The largest is in segment 5 measuring 3 cm diameter. This is progressing since the previous study. GALLBLADDER AND BILE DUCTS: Gallbladder is unremarkable. No biliary ductal dilatation. SPLEEN: Spleen demonstrates no acute abnormality. PANCREAS: Pancreas demonstrates no acute abnormality. ADRENAL GLANDS: Adrenal glands demonstrate no acute abnormality. KIDNEYS, URETERS AND BLADDER: No stones in the kidneys or ureters. No hydronephrosis. No perinephric or periureteral stranding. The bladder is obscured by streak artifact from hip arthroplasties. GI AND BOWEL:  Stomach and duodenal sweep demonstrate no acute abnormality. There is no bowel obstruction. No abnormal bowel wall thickening or distension. Stool throughout the colon. The appendix is normal. REPRODUCTIVE: The uterus and ovaries are not enlarged. PERITONEUM AND RETROPERITONEUM: No ascites or free air. LYMPH NODES: No retroperitoneal lymphadenopathy. 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 Streak artifact from the arthroplasties limits visualization of the low pelvis. Degenerative changes in the lumbar spine with mild lumbar scoliosis convex towards the right. Calcification of the abdominal aorta. No focal soft tissue abnormality. IMPRESSION: 1. No pulmonary embolism. 2. Esophageal obstruction with air-fluid level and debris, possibly due to mass, stricture, or extrinsic compression. 3. Right supraclavicular, mediastinal, and right hilar lymphadenopathy. , concerning for malignancy. 4. Right hilar mass with right middle lobe consolidation, likely postobstructive change. 5. 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 6. Multiple heterogeneous hypoenhancing liver lesions consistent with metastatic disease, progressing since the previous study. 7. Expansile soft tissue lesion involving the right anterior 7th rib, enlarging since prior study, consistent with bone metastasis. Electronically signed by: Elsie Gravely MD 07/17/2024 06:24 PM EST RP Workstation: [id number]  Procedures   Medications Ordered in the ED  acetaminophen  (TYLENOL ) tablet 650 mg (has no administration in time range)    Or  acetaminophen  (TYLENOL ) suppository 650 mg (has no administration in time range)  morphine  (PF) 2 MG/ML injection 1 mg (has no administration in time range)  pantoprazole  (PROTONIX ) injection 40 mg (has no administration in time range)  sodium chloride  0.9 % bolus 1,000 mL (1,000 mLs Intravenous New Bag/Given  07/17/24 1747)  ondansetron  (ZOFRAN ) injection 4 mg (4 mg Intravenous Given 07/17/24 1747)  iohexol  (OMNIPAQUE ) 350 MG/ML injection 100 mL (100 mLs Intravenous Contrast Given 07/17/24 1751)                                    Medical Decision Making Amount and/or Complexity of Data Reviewed Radiology: ordered.  Risk Prescription drug management. Decision regarding hospitalization.    Rachel Bates is a 79 y.o. female with a past medical history significant for CHF, atrial fibrillation on Eliquis  therapy, previous DVT and PE, previous lung cancer, previous diffuse B-cell lymphoma, and difficulty swallowing who presents from her oncologist office for evaluation and admission for further management of weight loss, intermittent abdominal pain, and worsened difficulty swallowing.  According to patient and family, her oncologist sent her to the emergency department for workup and to be admitted for rehydration and to create a plan for nutrition delivery for the patient.  They say that over the last several months and more worsening the last few weeks patient has not been able to tolerate p.o. intake aside from occasional shakes.  She previously weighed 170 pounds last year and now weighs in the 120s.  She is having abdominal pain that comes and goes and is also having difficulty swallowing because she feels like something is pushing on her esophagus not letting food go down.  She denies any focal piece of food getting stuck recently but anything more solid she eats and drink comes back up.  She does report some cough on and off but denies significant chest pain or shortness of breath at this time.  Reports loose stools but does not say she is me anything solid to make stools.  She reports some dark urine.  Denies any trauma.  Denies any fevers, chills, or other complaints.  Patient does report some cough and congestion but she reports that seems to be more chronic.  Family reports she is almost too weak  to walk around like she normally could recently.  On exam, lungs have coarseness.  Chest is nontender.  I do not appreciate a murmur.  Abdomen is tender in the left lower abdomen but I did hear bowel sounds.  Patient is ill-appearing and does have dry mucous membranes.  Patient's legs are nontender and nonedematous.  Due to the abdominal tenderness and nausea of I will get CT on pelvis.  Will get CT of the chest as well and given her history of pulmonary embolism we will get a CT PE study.  Will give her some IV fluids and some nausea medicine for her symptoms and what appears to be dehydration.  She had labs at her PCP office that were not egregious however she did have elevated hemoglobin likely due to some degree of hemoconcentration and dehydration.  Anticipate admission after imaging is completed.  6:45 PM CT scan does show more metastasis in bone, liver, and lungs but also shows evidence of esophageal obstruction  of some kind.  Will call [facility] as she is seeing them in the past and will call medicine for admission for hydration and management.     Final diagnoses:  Dysphagia, unspecified type  Abdominal pain, unspecified abdominal location     Clinical Impression: 1. Dysphagia, unspecified type   2. Abdominal pain, unspecified abdominal location     Disposition: Admit  This note was prepared with assistance of Dragon voice recognition software. Occasional wrong-word or sound-a-like substitutions may have occurred due to the inherent limitations of voice recognition software.      Lilyrose Tanney, Lonni PARAS, MD 07/17/24 1944  "

## 2024-07-17 NOTE — Progress Notes (Signed)
 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 female who has been referred to us  by Llewellyn Gerard Caldron, DO for evaluation and management of non hodgkin's lymphoma. She has a hx of chronic systolic heart failure.   Nasolaryngoscopy revealed a mass lesion of the left oropharynx, emanating from the left base of tongue/glossotonsillar sulcus. Patient subsequently had a CT neck with contrast performed on 08/09/2022, which reported extensive bilateral adenopaty, with greatest lymph node conglomerate measuring 3.2 x 2.4 cm in the right level 2. Patient underwent core needle biopsy on 08/18/22 and endorsed persistent throat pain and odynophagia at the time. She is a former smoker and quit in the late 90s. SHe endorsed heavy tobacco use prior to quitting, with 1.5 pack/day for about 30 years.    Today, she is accompanied by her daughter. She complains of a sore throat beginning around Thanksgiving time. She was given antibiotics and prednisone  which did not improve symptoms. She endorses swelling in her neck with associated soreness which came quickly past 2-3 weeks. She reports that her neck edema has recently grown on both the left and right sides. Her neck pain is in a specific area. She also complains of recent her neck pain if she turns her neck.   She did present Emergency EMT because something was stuck in her throat. Endoscopy revealed that it was food and it was pushed down. An ulcer was also found in her esophagus  and some narrowing which will be stretched in three months. She has not had any issues with swallowing food since then. She has not had a biopsy of issue on the back of her tongue. No other lumps/bumps, unexplained fevers, chills, night sweat, change in breathing, abdominal pain, or skin rashes. She reports discomfort with drinking tea which caused her to eat less as well.    She is allergic to Sumatriptan and another medication she cannot recall. She does not consume alcohol and has quit smoking in the 90s. 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 She believes the sore was caused by shoe discomfort.   She denies any facial puffiness, abdominal pain, recent change in bowel habits or urination. She reports that her defibrillator has never gone off.    She reports a ganglion cyst on her right knee, 2 knee replacements, and 2 hip replacements. She denies a Fhx of blood disorders. However, her sister has lung cancer and was a frequent smoker.   She is UTD with her vaccinations, incuding influenza, RSV, and COVID-19 booster. She has endorsed migraines since childhood. She lives on her own is able to complete daily activities independently. She will follow-up with Dr. Llewellyn soon. She follows up with cardiologist regularly. Her a fib, pacemaker, and ICD device have been stable. She has not had a recent ECHO.  SUMMARY OF ONCOLOGIC HISTORY: Oncology History  Diffuse large B-cell lymphoma of lymph nodes of multiple regions (HCC)  08/14/2022 Cancer Staging   Staging form: Hodgkin and Non-Hodgkin Lymphoma,  AJCC 8th Edition - Clinical stage from 08/14/2022: Stage III (Diffuse large B-cell lymphoma) - Signed by Onesimo Emaline Brink, MD on 09/10/2023 Stage prefix: Initial diagnosis   10/19/2022 Initial Diagnosis   Diffuse large B-cell lymphoma of lymph nodes of multiple regions   10/31/2022 - 02/17/2023 Chemotherapy    Patient is on Treatment Plan : NON-HODGKIN'S LYMPHOMA R-CEOP q21d x 6 Cycles     Cancer of upper lobe of right lung (HCC)  09/10/2023 Initial Diagnosis   Cancer of upper lobe of right lung (HCC)   09/10/2023 Cancer Staging   Staging form: Lung, AJCC V9 - Clinical: cT1c, cN2, cM0 - Signed by Onesimo Emaline Brink, MD on 09/10/2023 Method of lymph node assessment: Clinical Histologic grade (G): GX Histologic grading system: 4 grade system Laterality: Right   Squamous cell lung cancer, right (HCC)  09/10/2023 Initial Diagnosis   Squamous cell lung cancer, right (HCC)   09/18/2023 - 10/16/2023 Chemotherapy   Patient is on Treatment Plan : LUNG Carboplatin  + Paclitaxel  + XRT q7d     01/02/2024 - 05/21/2024 Chemotherapy   Patient is on Treatment Plan : LUNG Durvalumab  (10) q14d     07/18/2024 -  Chemotherapy   Patient is on Treatment Plan : LUNG NSCLC Nivolumab (3) D1,15,29 + Ipilimumab (1) D1 q42d      INTERVAL HISTORY:  Rachel Bates is a 79 y.o. female who is here today for continued evaluation and management of squamous cell carcinoma of the lung. accompanied by friends.  she was last seen by me on 06/04/2024; at the time she mentioned experiencing some drooping in her left eye, affecting her vision when looking downwards, and that the anterior of her shin and knee had been numb for the past.  Today, she says that she has not been doing well.   She says that she is experiencing difficulty swallowing occasionally ongoing for 40 days, and then she will vomit. This occurs just about every time, unless it is extremely soft. She has never undergone esophageal stretching, but this was recommended to be done following the discovery of esophageal stenosis.  She note that she had not experienced symptoms related to this until recently.  She says that her magic mouthwash does help some, but not always. She has not tried supplemental nutrition, such as BOOST or ENSURE, and for hydration she has been able  to regularly keep tea down but not water .   She weighed 170 LBS in February 2024, following her EGD in December 2023. On May 21, 2024, right before these symptoms began, she weighed 144 LBS. Today she is 128 LBS.   Notes that magic mouthwash does provide some relief from her symptoms, but this does not always work.   Having an occasional productive cough. Denies fever.   She also endorses intermittent diarrhea, attributed to mostly liquid diet.   REVIEW OF SYSTEMS:   10 Point review of systems of done and is negative except as noted above.  MEDICAL HISTORY Past Medical History:  Diagnosis Date   AICD (automatic cardioverter/defibrillator) present dual   Medtronic - original placed 2007, generator change 2014, generator extraction 03/14/2024   Anemia    Anticoagulant long-term use    eliquis    Anxiety    Arthralgia of multiple joints    Arthritis pain    Benign hypertensive heart disease    CAD (coronary artery disease) primary cardiologist-- dr danelle taylor   MI and 2 stents 1995 in [place]    Cancer (HCC)  Cataract ?   Surgery 2020   CHF NYHA class II, chronic, systolic (HCC)    followed by dr danelle taylor   Complication of anesthesia    Degenerative disc disease, lumbar    Depression    Difficult intubation    Dyslipidemia    History of basal cell carcinoma (BCC) excision    right ankle area s/p  excision in office 06/ 2019  in office   History of DVT of lower extremity yrs ago before 2012   History of MI (myocardial infarction) 1995  in [place]   History of pulmonary embolus (PE) 2012   History of ventricular tachycardia    Hyperlipidemia    Hypersomnia    Hypertension    Ischemic cardiomyopathy    Migraines    Myocardial infarction (HCC)    OA (osteoarthritis)    all over   PAF (paroxysmal atrial fibrillation) (HCC)    Pneumonia    02/2023   Primary localized osteoarthritis of right hip 12/18/2017   Primary localized osteoarthritis of right knee  05/28/2018   S/P coronary artery stent placement 1995   in [facility]   05-21-2018 per pt x2  stents in same coronary artery (unsure BM or DES)   Vitamin D  deficiency disease     SURGICAL HISTORY Past Surgical History:  Procedure Laterality Date   BIOPSY  06/19/2022   Procedure: BIOPSY;  Surgeon: Elicia Claw, MD;  Location: [facility] ENDOSCOPY;  Service: Gastroenterology;;   BRONCHIAL BIOPSY  08/07/2023   Procedure: BRONCHIAL BIOPSIES;  Surgeon: Shelah Lamar RAMAN, MD;  Location: [facility] ENDOSCOPY;  Service: Pulmonary;;   BRONCHIAL BRUSHINGS  08/07/2023   Procedure: BRONCHIAL BRUSHINGS;  Surgeon: Shelah Lamar RAMAN, MD;  Location: [facility] ENDOSCOPY;  Service: Pulmonary;;   BRONCHIAL BRUSHINGS  03/17/2024   Procedure: BRONCHOSCOPY, WITH BRUSH BIOPSY;  Surgeon: Shelah Lamar RAMAN, MD;  Location: [facility] ENDOSCOPY;  Service: Pulmonary;;   BRONCHIAL NEEDLE ASPIRATION BIOPSY  08/07/2023   Procedure: BRONCHIAL NEEDLE ASPIRATION BIOPSIES;  Surgeon: Shelah Lamar RAMAN, MD;  Location: [facility] ENDOSCOPY;  Service: Pulmonary;;   BRONCHIAL NEEDLE ASPIRATION BIOPSY  03/17/2024   Procedure: BRONCHOSCOPY, WITH NEEDLE ASPIRATION BIOPSY;  Surgeon: Shelah Lamar RAMAN, MD;  Location: [facility] ENDOSCOPY;  Service: Pulmonary;;   CARDIAC DEFIBRILLATOR PLACEMENT  2007   CARPAL TUNNEL RELEASE Right 02/26/2024   Procedure: CARPAL TUNNEL RELEASE;  Surgeon: Josefina Chew, MD;  Location: [facility] OR;  Service: Orthopedics;  Laterality: Right;   CATARACT EXTRACTION, BILATERAL     COLONOSCOPY  04/14/2013   colonic polyp, status post polypectomy. Mild panocolonic diverticulosis. Small internal hemorrhoids   CRYOTHERAPY  03/17/2024   Procedure: CRYOTHERAPY;  Surgeon: Shelah Lamar RAMAN, MD;  Location[facility]COPY;  Service: Pulmonary;;   DILATION AND CURETTAGE OF UTERUS  yrs ago   DIRECT LARYNGOSCOPY Right 10/04/2022   Procedure: DIRECT LARYNGOSCOPY;  Surgeon: Llewellyn Gerard LABOR, DO;  Location: [facility] OR;  Service: ENT;  Laterality: Right;   ENDOBRONCHIAL ULTRASOUND Bilateral  03/17/2024   Procedure: ENDOBRONCHIAL ULTRASOUND (EBUS);  Surgeon: Shelah Lamar RAMAN, MD;  Location: [facility] ENDOSCOPY;  Service: Pulmonary;  Laterality: Bilateral;   ESOPHAGOGASTRODUODENOSCOPY (EGD) WITH PROPOFOL  N/A 06/19/2022   Procedure: ESOPHAGOGASTRODUODENOSCOPY (EGD) WITH PROPOFOL ;  Surgeon: Elicia Claw, MD;  Location: [facility] ENDOSCOPY;  Service: Gastroenterology;  Laterality: N/A;   EYE SURGERY  2020   Cataracts removed   FIDUCIAL MARKER PLACEMENT  08/07/2023   Procedure: FIDUCIAL MARKER PLACEMENT;  Surgeon: Shelah Lamar RAMAN, MD;  Location: [facility] ENDOSCOPY;  Service: Pulmonary;;   ICD GENERATOR REMOVAL  N/A 03/14/2024   Procedure: ICD GENERATOR REMOVAL;  Surgeon: Waddell Danelle ORN, MD;  Location: [facility] INVASIVE CV LAB;  Service: Cardiovascular;  Laterality: N/A;   IMPACTION REMOVAL  06/19/2022   Procedure: IMPACTION REMOVAL;  Surgeon: Elicia Claw, MD;  Location: [facility] ENDOSCOPY;  Service: Gastroenterology;;   IMPLANTABLE CARDIOVERTER DEFIBRILLATOR GENERATOR CHANGE N/A 02/04/2013   Procedure: IMPLANTABLE CARDIOVERTER DEFIBRILLATOR GENERATOR CHANGE;  Surgeon: Danelle ORN Waddell, MD;  Location: [facility] CATH LAB;  Service: Cardiovascular;  Laterality: N/A;   IR IMAGING GUIDED PORT INSERTION  10/27/2022   JOINT REPLACEMENT  2019, 2022, 2023   Both hips, both knees   LYMPH NODE BIOPSY Right 10/04/2022   Procedure: EXCISIONAL OF RIGHT DEEP CERVICAL LYMPH NODE;  Surgeon: Llewellyn Gerard LABOR, DO;  Location: [facility] OR;  Service: ENT;  Laterality: Right;   SHOULDER ARTHROSCOPY Right 2015   TOTAL HIP ARTHROPLASTY Right 12/18/2017   Procedure: RIGHT TOTAL HIP ARTHROPLASTY;  Surgeon: Josefina Chew, MD;  Location: [facility] OR;  Service: Orthopedics;  Laterality: Right;   TOTAL HIP ARTHROPLASTY Left 04/19/2021   Procedure: TOTAL HIP ARTHROPLASTY;  Surgeon: Josefina Chew, MD;  Location: [facility] ORS;  Service: Orthopedics;  Laterality: Left;   TOTAL KNEE ARTHROPLASTY Right 05/28/2018   Procedure: TOTAL KNEE ARTHROPLASTY;  Surgeon: Josefina Chew, MD;  Location: [facility] ORS;  Service: Orthopedics;  Laterality: Right;  Adductor Block   TOTAL KNEE ARTHROPLASTY Left 08/12/2021   Procedure: TOTAL KNEE ARTHROPLASTY;  Surgeon: Josefina Chew, MD;  Location: [facility] ORS;  Service: Orthopedics;  Laterality: Left;   TUBAL LIGATION Bilateral yrs ago   VIDEO BRONCHOSCOPY WITH ENDOBRONCHIAL NAVIGATION Bilateral 03/17/2024   Procedure: VIDEO BRONCHOSCOPY WITH ENDOBRONCHIAL NAVIGATION;  Surgeon: Shelah Lamar RAMAN, MD;  Location: [facility] ENDOSCOPY;  Service: Pulmonary;  Laterality: Bilateral;  need EBUS scope also   VIDEO BRONCHOSCOPY WITH RADIAL ENDOBRONCHIAL ULTRASOUND  08/07/2023   Procedure: VIDEO BRONCHOSCOPY WITH RADIAL ENDOBRONCHIAL ULTRASOUND;  Surgeon: Shelah Lamar RAMAN, MD;  Location: [facility] ENDOSCOPY;  Service: Pulmonary;;   WISDOM TOOTH EXTRACTION      SOCIAL HISTORY Social History   Tobacco Use   Smoking status: Former    Current packs/day: 0.00    Types: Cigarettes    Start date: 05/21/1966    Quit date: 05/21/1996    Years since quitting: 28.1   Smokeless tobacco: Never  Vaping Use   Vaping status: Never Used  Substance Use Topics   Alcohol use: No   Drug use: Never    Social History   Social History Narrative   Not on file    SOCIAL DRIVERS OF HEALTH SDOH Screenings   Food Insecurity: No Food Insecurity (08/28/2023)  Housing: Low Risk (08/28/2023)  Transportation Needs: No Transportation Needs (08/28/2023)  Utilities: Not At Risk (08/28/2023)  Depression (PHQ2-9): Low Risk (06/04/2024)  Tobacco Use: Medium Risk (03/17/2024)     FAMILY HISTORY Family History  Problem Relation Age of Onset   Hypertension Mother    Thyroid  disease Mother    Alzheimer's disease Mother    Hearing loss Mother    Coronary artery disease Father    Pulmonary embolism Father    Arthritis Father    Heart disease Father    Congestive Heart Failure Maternal Grandmother    Hypertension Maternal Grandmother    Heart attack Maternal Grandfather    Other  Maternal Grandfather        carotid disease   Dementia Paternal Grandmother    Other Paternal Grandfather 1       accident  Cancer Sister      ALLERGIES: is allergic to sumatriptan succinate.  MEDICATIONS  Current Outpatient Medications  Medication Sig Dispense Refill   apixaban  (ELIQUIS ) 5 MG TABS tablet Take 1 tablet (5 mg total) by mouth 2 (two) times daily. 60 tablet 5   b complex vitamins capsule Take 1 capsule by mouth daily.     Calcium  Carb-Cholecalciferol  (CALCIUM  600 + D PO) Take 1 tablet by mouth daily.     carvedilol  (COREG ) 12.5 MG tablet Take 12.5 mg by mouth 2 (two) times daily with a meal.      Cholecalciferol  (VITAMIN D3) 125 MCG (5000 UT) capsule Take 5,000 Units by mouth daily.     cyanocobalamin  (VITAMIN B12) 1000 MCG tablet Take 1,000 mcg by mouth daily.     FIBER PO Take 1 capsule by mouth 2 (two) times daily.     gabapentin  (NEURONTIN ) 300 MG capsule Take 300 mg by mouth 3 (three) times daily.     HYDROcodone -acetaminophen  (NORCO/VICODIN) 5-325 MG tablet Take 1 tablet by mouth every 6 (six) hours as needed for moderate pain (pain score 4-6). 60 tablet 0   ipratropium (ATROVENT  HFA) 17 MCG/ACT inhaler Inhale 2 puffs into the lungs every 6 (six) hours as needed for wheezing. 1 each 1   loratadine  (CLARITIN ) 10 MG tablet Take 10 mg by mouth daily.     Omega-3 Fatty Acids (FISH OIL) 1000 MG CAPS Take 1,000 mg by mouth daily.     ramipril  (ALTACE ) 2.5 MG tablet Take 2.5 mg by mouth daily.       simvastatin  (ZOCOR ) 40 MG tablet Take 40 mg by mouth at bedtime.       tiZANidine  (ZANAFLEX ) 4 MG tablet Take 2 mg by mouth every 8 (eight) hours as needed for muscle spasms.     traZODone  (DESYREL ) 50 MG tablet Take 50 mg by mouth at bedtime.      venlafaxine  (EFFEXOR ) 75 MG tablet Take 75 mg by mouth daily.     vitamin E  400 UNIT capsule Take 400 Units by mouth daily.     zonisamide  (ZONEGRAN ) 50 MG capsule Take 50-100 mg by mouth See admin instructions. Take 50mg  by mouth  in the morning and 100mg  at night.     No current facility-administered medications for this visit.    PHYSICAL EXAMINATION: ECOG PERFORMANCE STATUS: 2 - Symptomatic, <50% confined to bed VITALS: Vitals:   07/17/24 1450  BP: 117/65  Pulse: 87  Resp: 18  Temp: 97.7 F (36.5 C)  SpO2: 97%   Filed Weights   07/17/24 1450  Weight: 128 lb 6.4 oz (58.2 kg)   Body mass index is 20.11 kg/m.  GENERAL: alert, in no acute distress and comfortable SKIN: no acute rashes, (+) lesion on top of head, grown slightly EYES: conjunctiva are pink and non-injected, sclera anicteric OROPHARYNX: MMM, no exudates, no oropharyngeal erythema or ulceration NECK: supple, no JVD LYMPH:  no palpable lymphadenopathy in the cervical, axillary or inguinal regions LUNGS: clear to auscultation b/l with (+) cough HEART: regular rate & rhythm ABDOMEN:  normoactive bowel sounds, (+) right lower quadrant abdominal pain, not distended, no hepatosplenomegaly Extremity: no pedal edema PSYCH: alert & oriented x 3 with fluent speech NEURO: no focal motor/sensory deficits  LABORATORY DATA:   I have reviewed the data as listed     Latest Ref Rng & Units 07/17/2024    2:30 PM 06/04/2024   11:43 AM 05/21/2024   10:27 AM  CBC EXTENDED  WBC  4.0 - 10.5 K/uL 4.5  6.3  5.0   RBC 3.87 - 5.11 MIL/uL 4.91  4.71  4.37   Hemoglobin 12.0 - 15.0 g/dL 84.7  85.1  86.4   HCT 36.0 - 46.0 % 43.7  43.1  39.8   Platelets 150 - 400 K/uL 148  115  113   NEUT# 1.7 - 7.7 K/uL 3.6  5.2  4.4   Lymph# 0.7 - 4.0 K/uL 0.4  0.5  0.3       Latest Ref Rng & Units 06/04/2024   11:43 AM 05/21/2024   10:27 AM 05/07/2024    1:37 PM  CMP  Glucose 70 - 99 mg/dL 898  94  899   BUN 8 - 23 mg/dL 15  13  12    Creatinine 0.44 - 1.00 mg/dL 9.37  9.42  9.35   Sodium 135 - 145 mmol/L 138  139  141   Potassium 3.5 - 5.1 mmol/L 4.0  4.0  3.4   Chloride 98 - 111 mmol/L 103  106  107   CO2 22 - 32 mmol/L 28  27  29    Calcium  8.9 - 10.3 mg/dL 9.7   9.8  9.0   Total Protein 6.5 - 8.1 g/dL 6.9  6.7  6.6   Total Bilirubin 0.0 - 1.2 mg/dL 0.4  0.4  0.4   Alkaline Phos 38 - 126 U/L 96  87  93   AST 15 - 41 U/L 23  14  15    ALT 0 - 44 U/L 17  11  12     04/22/2024 MOLECULAR PATHOLOGY   RADIOGRAPHIC STUDIES: I have personally reviewed the radiological images as listed and agreed with the findings in the report. US  CORE BIOPSY (SOFT TISSUE) Result Date: 06/06/2024 CLINICAL DATA:  The patient has a palpable mass in the left posterior thigh region which correlates with an irregular round hypoechoic subcutaneous nodule with increased vascularity. EXAM: MUSCLE/SOFT TISSUE CORE BIOPSY ANESTHESIA/SEDATION: None MEDICATIONS: None PROCEDURE: None COMPLICATIONS: None immediate FINDINGS: The patient was placed in a prone position on the ultrasound table after written consent was obtained. Written consent was obtained by the patient after explaining the procedure risks, benefits, and alternatives. The patient stated understanding. All the patient's questions were answered. Ultrasound redemonstrated the hypoechoic nodule in the left posterior thigh region. The patient's skin was marked, prepped, and draped in the usual sterile fashion. Local anesthesia was achieved with 1% lidocaine . A small incision was created in the patient's skin and an introducer was advanced under ultrasound guidance until the tip of the introducer was at the proximal edge of the mass. The stylet was removed and the BioPince needle was then advanced under ultrasound guidance. A 2.3 cm core sample was then obtained. The sample was evaluated and demonstrated good capture of tissue. A second sample was obtained. The samples were sent to pathology for further processing. Sterile dressing was applied. IMPRESSION: Satisfactory core needle biopsy of a left posterior thigh mass. Electronically Signed   By: Cordella Banner   On: 06/06/2024 14:07   CT CHEST ABDOMEN PELVIS W CONTRAST Result Date:  05/31/2024 EXAM: CT CHEST, ABDOMEN AND PELVIS WITH CONTRAST 05/27/2024 08:59:48 AM TECHNIQUE: CT of the chest, abdomen and pelvis was performed with the administration of 100 mL iohexol  (OMNIPAQUE ) 300 MG/ML solution. Multiplanar reformatted images are provided for review. Automated exposure control, iterative reconstruction, and/or weight based adjustment of the mA/kV was utilized to reduce the radiation dose to as low as reasonably achievable.  COMPARISON: CT 02/13/2024 and PET CT 12/31/2023. CLINICAL HISTORY: Non-small cell lung cancer (NSCLC), metastatic, assess treatment response; Evaluation of response to treatment for metastatic lung NSCLC. * Tracking Code: BO * FINDINGS: CHEST: MEDIASTINUM AND LYMPH NODES: Heart and pericardium are unremarkable. The central airways are clear. Interval increase in size of right supraclavicular node measuring 3.2 cm compared to 2.2 cm. Right paratracheal lymph node with central necrosis measures 1.5 cm compared to 1.5 cm. No hilar or axillary lymphadenopathy. LUNGS AND PLEURA: Perihilar consolidation in the right upper lobe related to radiation therapy similar to prior. Right upper lobe nodule measuring 6 mm on image 28 of series 6, compared to 5 mm. Right apical nodule along the pleural surface measuring 10 mm compared to 7 mm on image 22. Within the left upper lobe pulmonary nodule measuring 8 mm on image 57 compared to 7 mm. Medial lingular nodule measuring 5 mm on image 82 compared to 2 mm. No pleural effusion or pneumothorax. ABDOMEN AND PELVIS: LIVER: Small hypodense lesion in the left hepatic lobe is unchanged. No new hepatic lesion. GALLBLADDER AND BILE DUCTS: Gallbladder is unremarkable. No biliary ductal dilatation. SPLEEN: No acute abnormality. PANCREAS: No acute abnormality. ADRENAL GLANDS: Adrenal glands are normal. KIDNEYS, URETERS AND BLADDER: No stones in the kidneys or ureters. No hydronephrosis. No perinephric or periureteral stranding. Urinary bladder is  unremarkable. GI AND BOWEL: Stomach demonstrates no acute abnormality. There is no bowel obstruction. REPRODUCTIVE ORGANS: No acute abnormality. PERITONEUM AND RETROPERITONEUM: No ascites. No free air. VASCULATURE: Aorta is normal in caliber. ABDOMINAL AND PELVIS LYMPH NODES: No lymphadenopathy. BONES AND SOFT TISSUES: Right chest wall port. Bilateral hip prosthetics. No acute osseous abnormality. No focal soft tissue abnormality. IMPRESSION: 1. Interval enlargement of right supraclavicular lymph node, compatible with progressive nodal disease. 2. Stable right paratracheal lymph node with central necrosis. 3. Increasing pulmonary nodules including right apical pleural-based nodule now 10 mm (previously 7 mm), left upper lobe nodule now 8 mm (previously 7 mm), and medial lingular nodule now 5 mm (previously 2 mm), compatible with progressive pulmonary metastatic disease. 4. Perihilar consolidation in the right upper lobe consistent with post-radiation change, unchanged. 5. Small left hepatic lobe hypodense lesion unchanged with no new hepatic lesions. 6. No evidence of adrenal metastases. No skeletal metastasis Electronically signed by: Norleen Boxer MD 05/31/2024 09:34 AM EST RP Workstation: [id number]   DG Shoulder Right Result Date: 05/06/2024 EXAM: 1 VIEW XRAY OF THE RIGHT SHOULDER 05/05/2024 10:36:54 AM COMPARISON: None available. CLINICAL HISTORY: Shoulder pain for 2 weeks. History of squamous cell lung cancer. X-rays ordered to rule out metastases. FINDINGS: BONES AND JOINTS: Glenohumeral joint is normally aligned. No acute fracture or dislocation. Degenerative subcortical cysts are observed in the humeral head and greater tuberosity region laterally. Moderate glenohumeral and acromioclavicular joint osteoarthritis with associated spurring. Narrowing of the subacromial space consistent with chronic rotator cuff disease. Type 2 subacromial morphology (curved). SOFT TISSUES: Right chest power injectable port  in place; tip projects over the right atrium. AICD leads noted. Bandlike opacity in the right upper lung may reflect posttreatment changes. Small adjacent fiducial marker noted. Atheromatous vascular calcification of the aortic arch. IMPRESSION: 1. No acute findings. 2. Moderate glenohumeral joint osteoarthritis with associated spurring. 3. Moderate acromioclavicular joint osteoarthritis with associated spurring. 4. Narrowing of the subacromial space consistent with chronic rotator cuff disease. 5. Degenerative subcortical cysts in the humeral head and greater tuberosity region laterally. 6. Atherosclerosis. 7. Chronic scarring in the upper lung. Electronically signed by: Ryan  Ramond MD 05/06/2024 02:20 PM EDT RP Workstation: [id number]   US  LT LOWER EXTREM LTD SOFT TISSUE NON VASCULAR Result Date: 04/30/2024 CLINICAL DATA:  History of metastatic non-small cell lung cancer. Concern for left posterior thigh mass. EXAM: ULTRASOUND left LOWER EXTREMITY LIMITED TECHNIQUE: Ultrasound examination of the lower extremity soft tissues was performed in the area of clinical concern. COMPARISON:  Ultrasound dated 11/21/2010. FINDINGS: Targeted sonographic images of the soft tissues of the posterior left thigh performed. There is a 2.1 x 1.7 x 1.9 cm hypoechoic mass with irregular margins in the soft tissues of the posterior left thigh suspicious for malignancy or metastatic disease. IMPRESSION: Soft tissue mass in the posterior left thigh concerning for metastatic disease. Electronically Signed   By: Vanetta Chou M.D.   On: 04/30/2024 14:28    ASSESSMENT & PLAN:   Rachel Bates is 79 y.o.  female who presents for a follow up for continued management of squamous cell carcinoma of the lung.    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 Completed radiation therapy on 11/02/2023.  --Started Cycle 1, Day 1 of Durvalumab  on  09/18/2023.   --CT Head from 12/31/2023 showed no intracranial metastatic disease --PET scan from 12/31/2023 showed previous RUL perihilar mass and previously seen nodes in the right hilum in the mid mediastinum appears smaller and shows less uptake. There are new hypermetabolic nodes more superiorly in the right side of the mediastinum along with LUL nodular area with more uptake.  --CT chest from 02/13/2024 continued decreased in size of treated mass in the anterior perihilar RUL.Interval increase in size of nodules throughout the right apex.Unchanged clustered nodules in the peripheral posterior left upper lobe.Interval enlargement of high right pretracheal, paratracheal, and supraclavicular nodes, previously FDG avid, consistent with worsened nodal metastatic disease. Similar appearance of matted, treated low pretracheal, subcarinal, and right hilar lymph nodes -- 05/27/2024 CT A/P:  1. Interval enlargement of right supraclavicular lymph node, compatible with progressive nodal disease.    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  2. Stable right paratracheal lymph node with central necrosis.  3. Increasing pulmonary nodules including right apical pleural-based nodule now 10 mm (previously 7 mm), left upper lobe nodule now 8 mm (previously 7 mm), and medial lingular nodule now 5 mm (previously 2 mm), compatible with progressive pulmonary metastatic disease.  4. Perihilar consolidation in the right upper lobe consistent with post-radiation change, unchanged.  5. Small left hepatic lobe hypodense lesion unchanged with no new hepatic lesions.  6. No evidence of adrenal metastases. No skeletal metastasis    There does seem to be some degree of progression, but none in the abdomen or bones.   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    PLAN: - Discussed lab results on 07/17/2024 in detail with patient: CBC showed WBC of 4.5K, Hemoglobin of 15.2 increased from 14.8, PLTs of 148K increased from 115K, and Lymphocytes of 0.4K decreased from 0.5K CMP stable.  - On review of her weight, starting at 170 LBS in February 2024, following her EGD in December 2024, she has since gradually lost weight.  On  May 21, 2024, right before these symptoms began, she weighed 144 LBS.  Today she is 128 LBS.   FOLLOW-UP in ED for emergent evaluation of difficulty swallowing impacting weight.   The total time spent in the appointment was 40 minutes* .  All of the patient's questions were answered and the patient knows to call the clinic with any problems, questions, or concerns.  Emaline Saran MD MS AAHIVMS [facility] Hematology/Oncology Physician [facility]  *Total Encounter Time as defined by the Centers for Medicare and Medicaid Services includes, in addition to the face-to-face time of a patient visit (documented in the note above) non-face-to-face time: obtaining and reviewing outside history, ordering and reviewing medications, tests or procedures, care coordination (communications with other health care professionals or caregivers) and documentation in the medical record.  I,Emily Lagle,acting as a neurosurgeon for Emaline Saran, MD.,have documented all relevant documentation on the behalf of Emaline Saran, MD,as directed by  Emaline Saran, MD while in the presence of Emaline Saran, MD.  I have reviewed the above documentation for accuracy and completeness, and I agree with the above.  Emaline Saran, MD "

## 2024-07-17 NOTE — ED Triage Notes (Addendum)
 Pt [facility] ca center r/t not being able to eat, RN that brought her over states increased difficulty swallowing/eat x last couple of weeks. Hx of non hodgkin's lymphoma  with R. Lung sq. Cell, platelet 148, above her baseline. Pt able to swallow spit, denies any problems controlling secretions

## 2024-07-17 NOTE — H&P (Signed)
 " [facility] History and Physical  Rachel Bates FMW:[id number] DOB: [birth year] DOA: 07/17/2024   PCP: O'Buch, Greta, PA-C  Specialists: Dr. Onesimo who is her medical oncologist.  Dr. Waddell is her cardiologist.  Chief Complaint: Difficulty swallowing with weight loss ongoing for past 5 weeks  HPI: Rachel Bates is a 79 y.o. 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  Supposed to start immunotherapy in the near future according to patient.  She went to her oncologist office today for follow-up.  She mentioned to them that she has been having difficulty swallowing solids more than liquids for the past 5 weeks.  She has lost at least 20 pounds in the last 5 weeks.  She was sent from the oncology office to emergency department for further evaluation and workup.  She denies any chest pain per se.  She mentions that whenever she tries to eat solid it gets stuck in the lower chest area and then she has to vomit it out.  She has been able to tolerate liquids though.  She has been having some loose stools but no diarrhea in the last day and a half.  No difficulty urinating.  No fever or chills.  No headaches.  No lightheadedness.  In the emergency department she underwent blood work and imaging studies which raise concern for distal esophageal obstruction.  She will be hospitalized for further management.  Home Medications: This list is not reconciled yet. Prior to Admission medications  Medication Sig Start Date End Date Taking? Authorizing Provider  apixaban  (ELIQUIS ) 5 MG TABS tablet Take 1 tablet (5 mg total) by mouth 2 (two) times daily. 04/14/24   Waddell Danelle ORN, MD  b complex vitamins capsule Take 1 capsule by mouth daily.    [provider]  Calcium  Carb-Cholecalciferol  (CALCIUM  600 + D PO) Take 1 tablet by mouth daily.    [provider]  carvedilol  (COREG ) 12.5 MG tablet Take 12.5 mg by mouth 2 (two) times daily with a meal.     [provider]  Cholecalciferol  (VITAMIN D3) 125 MCG (5000 UT) capsule Take 5,000 Units by mouth daily.    [provider]  cyanocobalamin  (VITAMIN B12) 1000 MCG tablet Take 1,000 mcg by mouth daily.    [provider]  FIBER PO Take 1 capsule by mouth 2 (two) times daily.    [provider]  gabapentin  (NEURONTIN ) 300 MG capsule Take 300 mg by mouth 3 (three) times daily. 04/09/20   [provider]  HYDROcodone -acetaminophen  (NORCO/VICODIN) 5-325 MG tablet Take 1 tablet by mouth every 6 (six) hours as needed for moderate pain (pain score 4-6). 06/04/24   Kale, Gautam Kishore, MD  ipratropium (ATROVENT  HFA) 17 MCG/ACT inhaler Inhale 2 puffs into the lungs every 6 (six) hours as needed for wheezing. 04/25/24   Onesimo Emaline Brink, MD  loratadine  (CLARITIN ) 10 MG tablet Take 10 mg by mouth daily.    [provider]  Omega-3 Fatty Acids (FISH OIL) 1000 MG CAPS Take 1,000 mg by mouth daily.    [provider]  ramipril  (ALTACE ) 2.5 MG tablet Take 2.5 mg by mouth daily.      [provider]  simvastatin  (ZOCOR ) 40 MG tablet Take 40 mg by mouth at bedtime.      [provider]  tiZANidine  (ZANAFLEX ) 4 MG tablet Take  2 mg by mouth every 8 (eight) hours as needed for muscle spasms. 01/12/23   [provider]  traZODone  (DESYREL ) 50 MG tablet Take 50 mg by mouth at bedtime.     [provider]  venlafaxine  (EFFEXOR ) 75 MG tablet Take 75 mg by mouth daily. 03/06/21   [provider]  vitamin E  400 UNIT capsule Take 400 Units by mouth daily.    [provider]  zonisamide  (ZONEGRAN ) 50 MG capsule Take 50-100 mg by mouth See admin instructions. Take 50mg  by mouth in the morning and 100mg  at night. 09/13/17   [provider]    Allergies: Allergies[1]  Past Medical History: Past  Medical History:  Diagnosis Date   AICD (automatic cardioverter/defibrillator) present dual   Medtronic - original placed 2007, generator change 2014, generator extraction 03/14/2024   Anemia    Anticoagulant long-term use    eliquis    Anxiety    Arthralgia of multiple joints    Arthritis pain    Benign hypertensive heart disease    CAD (coronary artery disease) primary cardiologist-- dr danelle taylor   MI and 2 stents 1995 in [place]    Cancer [facility])    Cataract ?   Surgery 2020   CHF NYHA class II, chronic, systolic (HCC)    followed by dr danelle taylor   Complication of anesthesia    Degenerative disc disease, lumbar    Depression    Difficult intubation    Dyslipidemia    History of basal cell carcinoma (BCC) excision    right ankle area s/p  excision in office 06/ 2019  in office   History of DVT of lower extremity yrs ago before 2012   History of MI (myocardial infarction) 1995  in [place]   History of pulmonary embolus (PE) 2012   History of ventricular tachycardia    Hyperlipidemia    Hypersomnia    Hypertension    Ischemic cardiomyopathy    Migraines    Myocardial infarction (HCC)    OA (osteoarthritis)    all over   PAF (paroxysmal atrial fibrillation) (HCC)    Pneumonia    02/2023   Primary localized osteoarthritis of right hip 12/18/2017   Primary localized osteoarthritis of right knee 05/28/2018   S/P coronary artery stent [facility] 1995   [facility]   05-21-2018 per pt x2  stents in same coronary artery (unsure BM or DES)   Vitamin D  deficiency disease     Past Surgical History:  Procedure Laterality Date   BIOPSY  06/19/2022   Procedure: BIOPSY;  Surgeon: Elicia Claw, MD;  Location: [facility] ENDOSCOPY;  Service: Gastroenterology;;   BRONCHIAL BIOPSY  08/07/2023   Procedure: BRONCHIAL BIOPSIES;  Surgeon: Shelah Lamar RAMAN, MD;  Location: [facility] ENDOSCOPY;  Service: Pulmonary;;   BRONCHIAL BRUSHINGS  08/07/2023   Procedure: BRONCHIAL BRUSHINGS;  Surgeon: Shelah Lamar RAMAN, MD;  Location: [facility] ENDOSCOPY;  Service: Pulmonary;;   BRONCHIAL BRUSHINGS  03/17/2024   Procedure: BRONCHOSCOPY, WITH BRUSH BIOPSY;  Surgeon: Shelah Lamar RAMAN, MD;  Location: [facility] ENDOSCOPY;  Service: Pulmonary;;   BRONCHIAL NEEDLE ASPIRATION BIOPSY  08/07/2023   Procedure: BRONCHIAL NEEDLE ASPIRATION BIOPSIES;  Surgeon: Shelah Lamar RAMAN, MD;  Location: [facility] ENDOSCOPY;  Service: Pulmonary;;   BRONCHIAL NEEDLE ASPIRATION BIOPSY  03/17/2024   Procedure: BRONCHOSCOPY, WITH NEEDLE ASPIRATION BIOPSY;  Surgeon: Shelah Lamar RAMAN, MD;  Location: [facility] ENDOSCOPY;  Service: Pulmonary;;   CARDIAC DEFIBRILLATOR PLACEMENT  2007   CARPAL TUNNEL RELEASE Right 02/26/2024  Procedure: CARPAL TUNNEL RELEASE;  Surgeon: Josefina Chew, MD;  Location: [facility] OR;  Service: Orthopedics;  Laterality: Right;   CATARACT EXTRACTION, BILATERAL     COLONOSCOPY  04/14/2013   colonic polyp, status post polypectomy. Mild panocolonic diverticulosis. Small internal hemorrhoids   CRYOTHERAPY  03/17/2024   Procedure: CRYOTHERAPY;  Surgeon: Shelah Lamar RAMAN, MD;  Location: [facility] ENDOSCOPY;  Service: Pulmonary;;   DILATION AND CURETTAGE OF UTERUS  yrs ago   DIRECT LARYNGOSCOPY Right 10/04/2022   Procedure: DIRECT LARYNGOSCOPY;  Surgeon: Llewellyn Gerard LABOR, DO;  Location: [facility] OR;  Service: ENT;  Laterality: Right;   ENDOBRONCHIAL ULTRASOUND Bilateral 03/17/2024   Procedure: ENDOBRONCHIAL ULTRASOUND (EBUS);  Surgeon: Shelah Lamar RAMAN, MD;  Location: [facility] ENDOSCOPY;  Service: Pulmonary;  Laterality: Bilateral;   ESOPHAGOGASTRODUODENOSCOPY (EGD) WITH PROPOFOL  N/A 06/19/2022   Procedure: ESOPHAGOGASTRODUODENOSCOPY (EGD) WITH PROPOFOL ;  Surgeon: Elicia Claw, MD;  Location: [facility] ENDOSCOPY;  Service: Gastroenterology;  Laterality: N/A;   EYE SURGERY  2020   Cataracts removed   FIDUCIAL MARKER PLACEMENT  08/07/2023   Procedure: FIDUCIAL MARKER PLACEMENT;  Surgeon: Shelah Lamar RAMAN, MD;  Location: [facility] ENDOSCOPY;  Service: Pulmonary;;   ICD GENERATOR  REMOVAL N/A 03/14/2024   Procedure: ICD GENERATOR REMOVAL;  Surgeon: Waddell Danelle ORN, MD;  [facility]: [facility] INVASIVE CV LAB;  Service: Cardiovascular;  Laterality: N/A;   IMPACTION REMOVAL  06/19/2022   Procedure: IMPACTION REMOVAL;  Surgeon: Elicia Claw, MD;  Location: [facility] ENDOSCOPY;  Service: Gastroenterology;;   IMPLANTABLE CARDIOVERTER DEFIBRILLATOR GENERATOR CHANGE N/A 02/04/2013   Procedure: IMPLANTABLE CARDIOVERTER DEFIBRILLATOR GENERATOR CHANGE;  Surgeon: Danelle ORN Waddell, MD;  Location: [facility] CATH LAB;  Service: Cardiovascular;  Laterality: N/A;   IR IMAGING GUIDED PORT INSERTION  10/27/2022   JOINT REPLACEMENT  2019, 2022, 2023   Both hips, both knees   LYMPH NODE BIOPSY Right 10/04/2022   Procedure: EXCISIONAL OF RIGHT DEEP CERVICAL LYMPH NODE;  Surgeon: Llewellyn Gerard LABOR, DO;  Location: [facility] OR;  Service: ENT;  Laterality: Right;   SHOULDER ARTHROSCOPY Right 2015   TOTAL HIP ARTHROPLASTY Right 12/18/2017   Procedure: RIGHT TOTAL HIP ARTHROPLASTY;  Surgeon: Josefina Chew, MD;  [facility]: [facility] OR;  Service: Orthopedics;  Laterality: Right;   TOTAL HIP ARTHROPLASTY Left 04/19/2021   Procedure: TOTAL HIP ARTHROPLASTY;  Surgeon: Josefina Chew, MD;  Location: [facility] ORS;  Service: Orthopedics;  Laterality: Left;   TOTAL KNEE ARTHROPLASTY Right 05/28/2018   Procedure: TOTAL KNEE ARTHROPLASTY;  Surgeon: Josefina Chew, MD;  Location: [facility] ORS;  Service: Orthopedics;  Laterality: Right;  Adductor Block   TOTAL KNEE ARTHROPLASTY Left 08/12/2021   Procedure: TOTAL KNEE ARTHROPLASTY;  Surgeon: Josefina Chew, MD;  Location: [facility] ORS;  Service: Orthopedics;  Laterality: Left;   TUBAL LIGATION Bilateral yrs ago   VIDEO BRONCHOSCOPY WITH ENDOBRONCHIAL NAVIGATION Bilateral 03/17/2024   Procedure: VIDEO BRONCHOSCOPY WITH ENDOBRONCHIAL NAVIGATION;  Surgeon: Shelah Lamar RAMAN, MD;  Location: [facility] ENDOSCOPY;  Service: Pulmonary;  Laterality: Bilateral;  need EBUS scope also   VIDEO BRONCHOSCOPY WITH RADIAL ENDOBRONCHIAL  ULTRASOUND  08/07/2023   Procedure: VIDEO BRONCHOSCOPY WITH RADIAL ENDOBRONCHIAL ULTRASOUND;  Surgeon: Shelah Lamar RAMAN, MD;  Location: [facility] ENDOSCOPY;  Service: Pulmonary;;   WISDOM TOOTH EXTRACTION      Social History: No history of smoking alcohol use or recreational drug use.  Has been quite weak the last few weeks and not very ambulatory.  Family History:  Family History  Problem Relation Age of Onset   Hypertension Mother    Thyroid  disease Mother  Alzheimer's disease Mother    Hearing loss Mother    Coronary artery disease Father    Pulmonary embolism Father    Arthritis Father    Heart disease Father    Congestive Heart Failure Maternal Grandmother    Hypertension Maternal Grandmother    Heart attack Maternal Grandfather    Other Maternal Grandfather        carotid disease   Dementia Paternal Grandmother    Other Paternal Grandfather 17       accident   Cancer Sister      Review of Systems - History obtained from the patient General ROS: positive for  - fatigue Psychological ROS: negative Ophthalmic ROS: negative ENT ROS: negative Allergy and Immunology ROS: negative Hematological and Lymphatic ROS: negative Endocrine ROS: negative Respiratory ROS: no cough, shortness of breath, or wheezing Cardiovascular ROS: no chest pain or dyspnea on exertion Gastrointestinal ROS: As in HPI Genito-Urinary ROS: no dysuria, trouble voiding, or hematuria Musculoskeletal ROS: negative Neurological ROS: no TIA or stroke symptoms Dermatological ROS: negative  Physical Examination  Vitals:   07/17/24 1623 07/17/24 1627 07/17/24 1730 07/17/24 1830  BP:   113/74   Pulse:   78 79  Resp: 19     Temp:  98.7 F (37.1 C)    TempSrc:  Oral    SpO2:   93% 94%  Weight:      Height:        BP 113/74   Pulse 79   Temp 98.7 F (37.1 C) (Oral)   Resp 19   Ht 5' 7 (1.702 m)   Wt 58.2 kg   SpO2 94%   BMI 20.11 kg/m   General appearance: alert, cooperative, appears stated  age, and no distress Head: Normocephalic, without obvious abnormality, atraumatic Eyes: conjunctivae/corneas clear. PERRL, EOM's intact.  Throat: lips, mucosa, and tongue normal; teeth and gums normal Neck: no adenopathy, no carotid bruit, no JVD, supple, symmetrical, trachea midline, and thyroid  not enlarged, symmetric, no tenderness/mass/nodules Resp: clear to auscultation bilaterally Cardio: regular rate and rhythm, S1, S2 normal, no murmur, click, rub or gallop GI: soft, non-tender; bowel sounds normal; no masses,  no organomegaly Extremities: extremities normal, atraumatic, no cyanosis or edema Pulses: 2+ and symmetric Skin: Skin color, texture, turgor normal. No rashes or lesions Lymph nodes: Cervical, supraclavicular, and axillary nodes normal. Neurologic: Alert and oriented x 3.  No facial asymmetry.  Cranial nerves II to XII intact.  Motor strength equal bilateral upper and lower extremities.   Labs on Admission: I have personally reviewed following labs and imaging studies  CBC: Recent Labs  Lab 07/17/24 1430  WBC 4.5  NEUTROABS 3.6  HGB 15.2*  HCT 43.7  MCV 89.0  PLT 148*   Basic Metabolic Panel: Recent Labs  Lab 07/17/24 1430  NA 137  K 3.6  CL 101  CO2 26  GLUCOSE 113*  BUN 8  CREATININE 0.53  CALCIUM  9.8  MG 2.1  PHOS 3.2   GFR: Estimated Creatinine Clearance: 52.4 mL/min (by C-G formula based on SCr of 0.53 mg/dL). Liver Function Tests: Recent Labs  Lab 07/17/24 1430  AST 24  ALT 15  ALKPHOS 96  BILITOT 1.1  PROT 6.8  ALBUMIN  4.1    Thyroid  Function Tests: Recent Labs    07/17/24 1430  TSH 1.640    Radiological Exams on Admission: CT ABDOMEN PELVIS W CONTRAST Result Date: 07/17/2024 EXAM: CTA CHEST PE WITH AND WITHOUT CONTRAST CT ABDOMEN AND PELVIS WITH CONTRAST 07/17/2024 06:06:12  PM TECHNIQUE: CTA of the chest was performed after the administration of 100 mL of iohexol  (OMNIPAQUE ) 350 MG/ML injection. Multiplanar reformatted images are  provided for review. MIP images are provided for review. CT of the abdomen and pelvis was performed with the administration of intravenous contrast. Automated exposure control, iterative reconstruction, and/or weight based adjustment of the mA/kV was utilized to reduce the radiation dose to as low as reasonably achievable. COMPARISON: CT chest abdomen and pelvis 05/27/2024. CLINICAL HISTORY: Abdominal pain, nausea, vomiting, cancer, weight loss. Pulmonary embolus suspected with high probability. Feels like something in the chest. Difficulty swallowing. FINDINGS: CHEST: PULMONARY ARTERIES: Pulmonary arteries are adequately opacified for evaluation. No intraluminal filling defect to suggest pulmonary embolism. Main pulmonary artery is normal in caliber. MEDIASTINUM: Right central venous catheter with tip in the SVC. Normal heart size. No pericardial effusions. Air-fluid level and debris in the esophagus with esophageal dilatation down to the level of about T8 with distal decompression of the esophagus. This is concerning for esophageal obstruction, possibly due to mass, stricture, or extrinsic compression. Right supraclavicular lymphadenopathy, today measuring 2.6 cm in diameter. Right paratracheal lymphadenopathy measuring up to 1.2 cm short axis. Developing right hilar mass or lymphadenopathy measuring 2.4 cm diameter. There is no acute abnormality of the thoracic aorta. LUNGS AND PLEURA: Consolidation in the right middle lung likely representing postobstructive change. Pneumonia would be a less likely consideration. Multiple pulmonary nodules are demonstrated throughout the lungs. Largest are in the right lung bases measuring up to 1.5 x 0.9 cm. Pulmonary nodularity is new since the prior study. 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 No pleural effusion or pneumothorax. SOFT TISSUES AND BONES: Degenerative changes in  the spine. Expansile soft tissue lesion involving the right anterior 7th rib, enlarging since prior study, consistent with bone metastasis. ABDOMEN AND PELVIS: LIVER: Diffuse fatty infiltration of the liver. Multiple heterogeneous hypoenhancing liver lesions consistent with metastatic disease. The largest is in segment 5 measuring 3 cm diameter. This is progressing since the previous study. GALLBLADDER AND BILE DUCTS: Gallbladder is unremarkable. No biliary ductal dilatation. SPLEEN: Spleen demonstrates no acute abnormality. PANCREAS: Pancreas demonstrates no acute abnormality. ADRENAL GLANDS: Adrenal glands demonstrate no acute abnormality. KIDNEYS, URETERS AND BLADDER: No stones in the kidneys or ureters. No hydronephrosis. No perinephric or periureteral stranding. The bladder is obscured by streak artifact from hip arthroplasties. GI AND BOWEL: Stomach and duodenal sweep demonstrate no acute abnormality. There is no bowel obstruction. No abnormal bowel wall thickening or distension. Stool throughout the colon. The appendix is normal. REPRODUCTIVE: The uterus and ovaries are not enlarged. PERITONEUM AND RETROPERITONEUM: No ascites or free air. LYMPH NODES: No retroperitoneal lymphadenopathy. 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 Streak artifact from the arthroplasties limits visualization of the low pelvis. Degenerative changes in the lumbar spine with mild lumbar scoliosis convex towards the right. Calcification of the abdominal aorta. No focal soft tissue abnormality. IMPRESSION: 1. No pulmonary embolism. 2. Esophageal obstruction with air-fluid level and debris, possibly due to mass, stricture, or extrinsic compression. 3. Right supraclavicular, mediastinal, and right hilar lymphadenopathy. , concerning for malignancy. 4. Right hilar mass with right middle lobe consolidation, likely postobstructive change. 5. Multiple pulmonary nodules, new since prior study,  concerning for development of pulmonary metastasis. 6. Multiple heterogeneous hypoenhancing liver lesions consistent with metastatic disease, progressing since the previous study. 7. Expansile soft tissue lesion involving the right anterior 7th rib,  enlarging since prior study, consistent with bone metastasis. Electronically signed by: Elsie Gravely MD 07/17/2024 06:24 PM EST RP Workstation: [id number]   CT Angio Chest PE W and/or Wo Contrast Result Date: 07/17/2024 EXAM: CTA CHEST PE WITH AND WITHOUT CONTRAST CT ABDOMEN AND PELVIS WITH CONTRAST 07/17/2024 06:06:12 PM TECHNIQUE: CTA of the chest was performed after the administration of 100 mL of iohexol  (OMNIPAQUE ) 350 MG/ML injection. Multiplanar reformatted images are provided for review. MIP images are provided for review. CT of the abdomen and pelvis was performed with the administration of intravenous contrast. Automated exposure control, iterative reconstruction, and/or weight based adjustment of the mA/kV was utilized to reduce the radiation dose to as low as reasonably achievable. COMPARISON: CT chest abdomen and pelvis 05/27/2024. CLINICAL HISTORY: Abdominal pain, nausea, vomiting, cancer, weight loss. Pulmonary embolus suspected with high probability. Feels like something in the chest. Difficulty swallowing. FINDINGS: CHEST: PULMONARY ARTERIES: Pulmonary arteries are adequately opacified for evaluation. No intraluminal filling defect to suggest pulmonary embolism. Main pulmonary artery is normal in caliber. MEDIASTINUM: Right central venous catheter with tip in the SVC. Normal heart size. No pericardial effusions. Air-fluid level and debris in the esophagus with esophageal dilatation down to the level of about T8 with distal decompression of the esophagus. This is concerning for esophageal obstruction, possibly due to mass, stricture, or extrinsic compression. Right supraclavicular lymphadenopathy, today measuring 2.6 cm in diameter. Right  paratracheal lymphadenopathy measuring up to 1.2 cm short axis. Developing right hilar mass or lymphadenopathy measuring 2.4 cm diameter. There is no acute abnormality of the thoracic aorta. LUNGS AND PLEURA: Consolidation in the right middle lung likely representing postobstructive change. Pneumonia would be a less likely consideration. Multiple pulmonary nodules are demonstrated throughout the lungs. Largest are in the right lung bases measuring up to 1.5 x 0.9 cm. Pulmonary nodularity is new since the prior study. 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 No pleural effusion or pneumothorax. SOFT TISSUES AND BONES: Degenerative changes in the spine. Expansile soft tissue lesion involving the right anterior 7th rib, enlarging since prior study, consistent with bone metastasis. ABDOMEN AND PELVIS: LIVER: Diffuse fatty infiltration of the liver. Multiple heterogeneous hypoenhancing liver lesions consistent with metastatic disease. The largest is in segment 5 measuring 3 cm diameter. This is progressing since the previous study. GALLBLADDER AND BILE DUCTS: Gallbladder is unremarkable. No biliary ductal dilatation. SPLEEN: Spleen demonstrates no acute abnormality. PANCREAS: Pancreas demonstrates no acute abnormality. ADRENAL GLANDS: Adrenal glands demonstrate no acute abnormality. KIDNEYS, URETERS AND BLADDER: No stones in the kidneys or ureters. No hydronephrosis. No perinephric or periureteral stranding. The bladder is obscured by streak artifact from hip arthroplasties. GI AND BOWEL: Stomach and duodenal sweep demonstrate no acute abnormality. There is no bowel obstruction. No abnormal bowel wall thickening or distension. Stool throughout the colon. The appendix is normal. REPRODUCTIVE: The uterus and ovaries are not enlarged. PERITONEUM AND RETROPERITONEUM: No ascites or free air. LYMPH NODES: No retroperitoneal  lymphadenopathy. 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 Streak artifact from the arthroplasties limits visualization of the low pelvis. Degenerative changes in the lumbar spine with mild lumbar scoliosis convex towards the right. Calcification of the abdominal aorta. No focal soft tissue abnormality. IMPRESSION: 1. No pulmonary embolism. 2. Esophageal obstruction with air-fluid level and debris, possibly due to mass, stricture, or extrinsic compression. 3. Right supraclavicular, mediastinal, and right hilar lymphadenopathy. , concerning for malignancy. 4. Right hilar  mass with right middle lobe consolidation, likely postobstructive change. 5. 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 6. Multiple heterogeneous hypoenhancing liver lesions consistent with metastatic disease, progressing since the previous study. 7. Expansile soft tissue lesion involving the right anterior 7th rib, enlarging since prior study, consistent with bone metastasis. Electronically signed by: Elsie Gravely MD 07/17/2024 06:24 PM EST RP Workstation: [id number]    Problem List  Principal Problem:   Dysphagia Active Problems:   Pulmonary embolism (HCC)   Paroxysmal atrial fibrillation (HCC)   Diffuse large B-cell lymphoma of lymph nodes of multiple regions (HCC)   Cancer of upper lobe of right lung (HCC)   Mediastinal adenopathy   Weight loss   Assessment: This is a 79 year old Caucasian female with past medical history as stated earlier comes in with several week history of progressively worsening dysphagia mainly to solids.  And as a result of this he is experience significant weight loss.  This is all in the setting of lung cancer.  Plan:  Esophageal dysphagia CT scan raise concern for distal esophageal obstruction.  Probably extrinsic considering significant mediastinal lymphadenopathy.  But cannot rule out  intrinsic lesion either.  [facility] gastroenterology has been consulted.  They will see the patient in the morning.  We will keep her n.p.o. for now.  Squamous cell lung cancer Under the care of Dr. Onesimo.  She completed chemoradiation last year.  Supposed to start immunotherapy soon.  CT scan done today raise concern for progression of her cancer.  She was found to have right supraclavicular mediastinal and right hilar lymphadenopathy.  Right hilar mass with right middle lobe consolidation, likely postobstructive changes ,were noted.  Multiple pulmonary nodules seen which were new compared to previous imaging studies.  Multiple liver lesions also noted concerning for metastases.  Seventh rib lesion also noted concerning for bone metastases. Her oncology is aware that patient will be hospitalized.  Will need to weigh in.  History of paroxysmal atrial fibrillation/history of pulmonary embolism No PE noted on current study.  She is noted to be on apixaban  which will be held as patient will likely need to undergo procedures.  Noted to be on beta-blocker.  Resume when able to take orally.  Currently has regular rate and rhythm.  History of non-Hodgkin's lymphoma She completed treatment in 2024.  Patient tells me that she is in remission.  History of migraine headaches On Zonegran  which will be held.  Essential hypertension Blood pressures are reasonably well-controlled.  Holding her medications due to dysphagia  DVT Prophylaxis: SCDs for now Code Status: Full code Family Communication: Discussed with patient and her daughter Disposition: Home when improved Consults called: [facility] Admission Status: Status is: Inpatient Remains inpatient appropriate because: Esophageal dysphagia    Severity of Illness: The appropriate patient status for this patient is INPATIENT. Inpatient status is judged to be reasonable and necessary in order to provide the required intensity of service to ensure  the patient's safety. The patient's presenting symptoms, physical exam findings, and initial radiographic and laboratory data in the context of their chronic comorbidities is felt to place them at high risk for further clinical deterioration. Furthermore, it is not anticipated that the patient will be medically stable for discharge from the hospital within 2 midnights of admission.   * I certify that at the point of admission it is my clinical judgment that the patient will require inpatient hospital care spanning beyond 2 midnights from the point of  admission due to high intensity of service, high risk for further deterioration and high frequency of surveillance required.*   Further management decisions will depend on results of further testing and patient's response to treatment.   Quandre Polinski  [facility] Pager on [email or web address]  07/17/2024, 7:38 PM     [1]  Allergies Allergen Reactions   Sumatriptan Succinate Other (See Comments)    Chest pain, no triptans, pt states it makes my heart race   "

## 2024-07-18 ENCOUNTER — Other Ambulatory Visit: Payer: Self-pay

## 2024-07-18 ENCOUNTER — Inpatient Hospital Stay

## 2024-07-18 ENCOUNTER — Encounter (HOSPITAL_COMMUNITY): Payer: Self-pay | Admitting: Internal Medicine

## 2024-07-18 DIAGNOSIS — K222 Esophageal obstruction: Secondary | ICD-10-CM | POA: Diagnosis not present

## 2024-07-18 LAB — PROTIME-INR
INR: 1.1 (ref 0.8–1.2)
Prothrombin Time: 14.8 s (ref 11.4–15.2)

## 2024-07-18 LAB — BASIC METABOLIC PANEL WITH GFR
Anion gap: 9 (ref 5–15)
BUN: 7 mg/dL — ABNORMAL LOW (ref 8–23)
CO2: 25 mmol/L (ref 22–32)
Calcium: 9 mg/dL (ref 8.9–10.3)
Chloride: 107 mmol/L (ref 98–111)
Creatinine, Ser: 0.5 mg/dL (ref 0.44–1.00)
GFR, Estimated: >60 mL/min
Glucose, Bld: 85 mg/dL (ref 70–99)
Potassium: 3.6 mmol/L (ref 3.5–5.1)
Sodium: 141 mmol/L (ref 135–145)

## 2024-07-18 LAB — URINALYSIS, W/ REFLEX TO CULTURE (INFECTION SUSPECTED)
Bilirubin Urine: NEGATIVE
Glucose, UA: NEGATIVE mg/dL
Hgb urine dipstick: NEGATIVE
Ketones, ur: 5 mg/dL — AB
Nitrite: NEGATIVE
Protein, ur: NEGATIVE mg/dL
Specific Gravity, Urine: 1.01 (ref 1.005–1.030)
pH: 6 (ref 5.0–8.0)

## 2024-07-18 LAB — CBC
HCT: 40.1 % (ref 36.0–46.0)
Hemoglobin: 13.6 g/dL (ref 12.0–15.0)
MCH: 31.5 pg (ref 26.0–34.0)
MCHC: 33.9 g/dL (ref 30.0–36.0)
MCV: 92.8 fL (ref 80.0–100.0)
Platelets: 114 K/uL — ABNORMAL LOW (ref 150–400)
RBC: 4.32 MIL/uL (ref 3.87–5.11)
RDW: 13.7 % (ref 11.5–15.5)
WBC: 3.5 K/uL — ABNORMAL LOW (ref 4.0–10.5)
nRBC: 0 % (ref 0.0–0.2)

## 2024-07-18 LAB — T4: T4, Total: 8 ug/dL (ref 4.5–12.0)

## 2024-07-18 MED ORDER — LORAZEPAM 2 MG/ML IJ SOLN
1.0000 mg | Freq: Every evening | INTRAMUSCULAR | Status: DC | PRN
  Administered 2024-07-18: 1 mg via INTRAMUSCULAR
  Filled 2024-07-18: qty 1

## 2024-07-18 MED ORDER — KCL IN DEXTROSE-NACL 20-5-0.9 MEQ/L-%-% IV SOLN
INTRAVENOUS | Status: AC
  Filled 2024-07-18 (×2): qty 1000

## 2024-07-18 NOTE — Anesthesia Preprocedure Evaluation (Signed)
 "                                  Anesthesia Evaluation  Patient identified by MRN, date of birth, ID band Patient awake    Reviewed: Allergy & Precautions, NPO status , Patient's Chart, lab work & pertinent test results  History of Anesthesia Complications (+) DIFFICULT AIRWAY and history of anesthetic complications  Airway Mallampati: III  TM Distance: >3 FB Neck ROM: Full    Dental  (+) Dental Advisory Given, Edentulous Upper, Edentulous Lower   Pulmonary former smoker, PE (2012) Metastatic squamous cell lung ca   Pulmonary exam normal breath sounds clear to auscultation       Cardiovascular hypertension (128/84 preop), Pt. on medications pulmonary hypertension (mild pHTN)+ CAD, + Past MI, + Cardiac Stents and +CHF  Normal cardiovascular exam+ dysrhythmias (eliquis ) Atrial Fibrillation + Cardiac Defibrillator (last gen change 03/2024)  Rhythm:Regular Rate:Normal  Echo 2024  1. Left ventricular ejection fraction, by estimation, is 50%. The left  ventricle has low normal function. The left ventricle demonstrates  regional wall motion abnormalities unchanged in location from prior study  on side by side review. Left ventricular diastolic parameters are consistent with Grade I diastolic dysfunction (impaired relaxation).   2. Right ventricular systolic function is normal. The right ventricular  size is normal. There is mildly elevated pulmonary artery systolic  pressure. The estimated right ventricular systolic pressure is 42.9 mmHg.   3. The mitral valve is grossly normal. Trivial mitral valve  regurgitation. No evidence of mitral stenosis.   4. The aortic valve is normal in structure. Aortic valve regurgitation is  not visualized. No aortic stenosis is present.   5. The inferior vena cava is dilated in size with <50% respiratory  variability, suggesting right atrial pressure of 15 mmHg.     Neuro/Psych  Headaches PSYCHIATRIC DISORDERS Anxiety Depression        GI/Hepatic Neg liver ROS,,,Esophageal dysphagia/obstruction CT scan raise concern for distal esophageal obstruction, possibly due to mass, stricture or extrinsic compression. Of note, air-fluid level and debris in the esophagus with esophageal dilatation down to the level of about T8 with distal decompression of the esophagus.    Endo/Other    Renal/GU negative Renal ROS  negative genitourinary   Musculoskeletal  (+) Arthritis , Osteoarthritis,    Abdominal   Peds  Hematology Hb 13.6, plt 114   Anesthesia Other Findings   Reproductive/Obstetrics negative OB ROS                              Anesthesia Physical Anesthesia Plan  ASA: 4  Anesthesia Plan: MAC   Post-op Pain Management:    Induction:   PONV Risk Score and Plan: 2 and Propofol  infusion and TIVA  Airway Management Planned: Natural Airway and Simple Face Mask  Additional Equipment: None  Intra-op Plan:   Post-operative Plan:   Informed Consent: I have reviewed the patients History and Physical, chart, labs and discussed the procedure including the risks, benefits and alternatives for the proposed anesthesia with the patient or authorized representative who has indicated his/her understanding and acceptance.       Plan Discussed with: CRNA  Anesthesia Plan Comments: (Marked difficult airway at some point- last airway note normal DL: Induction Type: IV induction Ventilation: Mask ventilation without difficulty Laryngoscope Size: Mac and 4 Grade View: Grade I  Tube type: Oral Tube size: 8.5 mm Number of attempts: 1 )         Anesthesia Quick Evaluation  "

## 2024-07-18 NOTE — Plan of Care (Signed)
  Problem: Education: Goal: Knowledge of General Education information will improve Description: Including pain rating scale, medication(s)/side effects and non-pharmacologic comfort measures Outcome: Progressing   Problem: Activity: Goal: Risk for activity intolerance will decrease Outcome: Progressing   Problem: Elimination: Goal: Will not experience complications related to urinary retention Outcome: Progressing   Problem: Pain Managment: Goal: General experience of comfort will improve and/or be controlled Outcome: Progressing   Problem: Safety: Goal: Ability to remain free from injury will improve Outcome: Progressing

## 2024-07-18 NOTE — Progress Notes (Signed)
 Chaplains received a consult to provide Rachel Bates with information about advance care planning.  We were unable to meet with her today and are currently unable to provide assistance with advance directives over the weekend.  If urgent emotional/spiritual needs arise over the weekend, please page the chaplain on-call at [phone].  If she is still inpatient on Monday, we will follow up.

## 2024-07-18 NOTE — Progress Notes (Signed)
 " Progress Note   Patient: Rachel Bates FMW:[id number] DOB: [birth year] DOA: 07/17/2024     1 DOS: the patient was seen and examined on 07/18/2024    Brief hospital course:  Rachel Bates is a 79 y.o. 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  Supposed to start immunotherapy in the near future according to patient.  She was sent to the ED from her oncologist office due to reported dysphagia, weight loss.CT revealed esophageal obstruction, among other findings.  Gastroenterology consulted.  Assessment and Plan:  Esophageal dysphagia/obstruction CT scan raise concern for distal esophageal obstruction, possibly due to mass, stricture or extrinsic compression. Of note, air-fluid level and debris in the esophagus with esophageal dilatation down to the level of about T8 with distal decompression of the esophagus. [facility] gastroenterology has been consulted.  Input appreciated. -For EGD in AM. -Will keep patient strict n.p.o. due to risk of aspiration from further enteral nutrition at this time. - IV fluids pending procedure in AM.   Squamous cell lung cancer, metastatic Under the care of Dr. Onesimo.   She completed chemoradiation last year.  Supposed to start immunotherapy soon.   CT scan done on admission raises concern for progression of her cancer.   Found to have right supraclavicular mediastinal and right hilar lymphadenopathy.  Right hilar mass with right middle lobe consolidation, likely postobstructive changes, were noted.   Multiple pulmonary nodules seen which were new compared to previous imaging studies.   Multiple liver lesions also noted concerning for metastases.  Seventh rib lesion also noted concerning for bone metastases.  - Oncology aware of hospitalization.   History of paroxysmal atrial fibrillation/history of pulmonary  embolism No PE noted on current study.   -She is noted to be on apixaban  which will be held as patient will likely need to undergo procedures.  -Noted to be on beta-blocker, which we will resume when able to take orally.  Currently has regular rate and rhythm.   History of non-Hodgkin's lymphoma She completed treatment in 2024.   Reportedly in remission. - Outpatient follow-up.   History of migraine headaches -On Zonegran , which will be held.   Essential hypertension Blood pressures are reasonably well-controlled. - Holding her medications due to dysphagia  Insomnia Takes trazodone  for sleep at home -Ordered IM as needed Ativan  for sleep.      Subjective: Patient has no new complaints.  She is concerned that she will not be able to sleep tonight since she is n.p.o. and usually takes trazodone  for sleep.  She requested we order something parenteral for sleep.  Physical Exam: BP 126/65 (BP Location: Left Arm)   Pulse 83   Temp 97.7 F (36.5 C) (Oral)   Resp 16   Ht 5' 7 (1.702 m)   Wt 58.2 kg   SpO2 96%   BMI 20.11 kg/m     General: Alert, oriented X3  Eyes: Pupils equal, reactive  Oral cavity: moist mucous membranes  Head: Atraumatic, normocephalic  Neck: supple  Chest: clear to auscultation. No crackles, no wheezes  CVS: S1,S2 RRR. No murmurs  Abd: No distention, soft, non-tender. No masses palpable  Extr: No edema   MSK: No joint deformities or swelling  Neurological: Grossly intact.    Data Reviewed:    Latest Ref Rng & Units 07/18/2024    5:27 AM 07/17/2024  2:30 PM 06/04/2024   11:43 AM  CBC  WBC 4.0 - 10.5 K/uL 3.5  4.5  6.3   Hemoglobin 12.0 - 15.0 g/dL 86.3  84.7  85.1   Hematocrit 36.0 - 46.0 % 40.1  43.7  43.1   Platelets 150 - 400 K/uL 114  148  115       Latest Ref Rng & Units 07/18/2024    5:27 AM 07/17/2024    2:30 PM 06/04/2024   11:43 AM  BMP  Glucose 70 - 99 mg/dL 85  886  898   BUN 8 - 23 mg/dL 7  8  15    Creatinine 0.44 - 1.00 mg/dL  9.49  9.46  9.37   Sodium 135 - 145 mmol/L 141  137  138   Potassium 3.5 - 5.1 mmol/L 3.6  3.6  4.0   Chloride 98 - 111 mmol/L 107  101  103   CO2 22 - 32 mmol/L 25  26  28    Calcium  8.9 - 10.3 mg/dL 9.0  9.8  9.7      Family Communication: Spoke with family member at bedside  Disposition: Status is: Inpatient Remains inpatient appropriate because: Esophageal obstruction pending evaluation and requiring n.p.o. status and maintenance IV fluids.  DVT PPx: SCDs (plan to resume home Eliquis  after procedure).      Author: MDALA-GAUSI, Christabell Loseke AGATHA, MD 07/18/2024 1:29 PM  For on call review [email or web address].    "

## 2024-07-18 NOTE — Progress Notes (Signed)
" °   07/18/24 1548  TOC Brief Assessment  Insurance and Status Reviewed  Patient has primary care physician Yes (O'Buch, Greta, PA-C)  Home environment has been reviewed Home  Prior level of function: Independent  Prior/Current Home Services No current home services  Social Drivers of Health Review SDOH reviewed no interventions necessary  Readmission risk has been reviewed Yes  Transition of care needs no transition of care needs at this time    "

## 2024-07-18 NOTE — Consult Note (Signed)
 Referring Provider: Dr. Ginger Primary Care Physician:  Venancio Pock, PA-C Primary Gastroenterologist:  Sampson  Reason for Consultation:  Esophageal Obstruction; Dysphagia  HPI: Rachel Bates is a 79 y.o. female with history of lung cancer on chemoradiation and history of food impaction in 2023 and EGD at that time showed an esophageal stricture, duodenal ulcers and gastritis.  Reports that for the last 40 days she has been unable to eat anything that was not liquid or pured.  Any soft food or solid food would hang up and she would have to vomited up.  Denies hematemesis melena or hematochezia.  She reports losing approximately 10 pounds during this time.  Denies abdominal pain.  CT scan shows an esophageal obstruction with air-fluid level and debris question extrinsic compression versus mass versus stricture.  A right hilar mass noted and multiple pulmonary nodules concerning for pulmonary metastasis as well as liver metastasis and bone metastasis.  On Eliquis  as outpatient.  Past Medical History:  Diagnosis Date   AICD (automatic cardioverter/defibrillator) present dual   Medtronic - original placed 2007, generator change 2014, generator extraction 03/14/2024   Anemia    Anticoagulant long-term use    eliquis    Anxiety    Arthralgia of multiple joints    Arthritis pain    Benign hypertensive heart disease    CAD (coronary artery disease) primary cardiologist-- dr danelle taylor   MI and 2 stents 1995 in [place]    Cancer [facility])    Cataract ?   Surgery 2020   CHF NYHA class II, chronic, systolic (HCC)    followed by dr danelle taylor   Complication of anesthesia    Degenerative disc disease, lumbar    Depression    Difficult intubation    Dyslipidemia    History of basal cell carcinoma (BCC) excision    right ankle area s/p  excision in office 06/ 2019  in office   History of DVT of lower extremity yrs ago before 2012   History of MI (myocardial infarction) 1995  in [place]    History of pulmonary embolus (PE) 2012   History of ventricular tachycardia    Hyperlipidemia    Hypersomnia    Hypertension    Ischemic cardiomyopathy    Migraines    Myocardial infarction (HCC)    OA (osteoarthritis)    all over   PAF (paroxysmal atrial fibrillation) (HCC)    Pneumonia    02/2023   Primary localized osteoarthritis of right hip 12/18/2017   Primary localized osteoarthritis of right knee 05/28/2018   S/P coronary artery stent placement 1995   in [facility]   05-21-2018 per pt x2  stents in same coronary artery (unsure BM or DES)   Vitamin D  deficiency disease     Past Surgical History:  Procedure Laterality Date   BIOPSY  06/19/2022   Procedure: BIOPSY;  Surgeon: Elicia Claw, MD;  Location: [facility] ENDOSCOPY;  Service: Gastroenterology;;   BRONCHIAL BIOPSY  08/07/2023   Procedure: BRONCHIAL BIOPSIES;  Surgeon: Shelah Lamar RAMAN, MD;  Location: [facility] ENDOSCOPY;  Service: Pulmonary;;   BRONCHIAL BRUSHINGS  08/07/2023   Procedure: BRONCHIAL BRUSHINGS;  Surgeon: Shelah Lamar RAMAN, MD;  Location: [facility] ENDOSCOPY;  Service: Pulmonary;;   BRONCHIAL BRUSHINGS  03/17/2024   Procedure: BRONCHOSCOPY, WITH BRUSH BIOPSY;  Surgeon: Shelah Lamar RAMAN, MD;  Location: [facility] ENDOSCOPY;  Service: Pulmonary;;   BRONCHIAL NEEDLE ASPIRATION BIOPSY  08/07/2023   Procedure: BRONCHIAL NEEDLE ASPIRATION BIOPSIES;  Surgeon: Shelah Lamar RAMAN, MD;  Location:  [facility] ENDOSCOPY;  Service: Pulmonary;;   BRONCHIAL NEEDLE ASPIRATION BIOPSY  03/17/2024   Procedure: BRONCHOSCOPY, WITH NEEDLE ASPIRATION BIOPSY;  Surgeon: Shelah Lamar RAMAN, MD;  Location: [facility] ENDOSCOPY;  Service: Pulmonary;;   CARDIAC DEFIBRILLATOR PLACEMENT  2007   CARPAL TUNNEL RELEASE Right 02/26/2024   Procedure: CARPAL TUNNEL RELEASE;  Surgeon: Josefina Chew, MD;  Location: [facility] OR;  Service: Orthopedics;  Laterality: Right;   CATARACT EXTRACTION, BILATERAL     COLONOSCOPY  04/14/2013   colonic polyp, status post polypectomy. Mild panocolonic diverticulosis.  Small internal hemorrhoids   CRYOTHERAPY  03/17/2024   Procedure: CRYOTHERAPY;  Surgeon: Shelah Lamar RAMAN, MD;  Location: [facility] ENDOSCOPY;  Service: Pulmonary;;   DILATION AND CURETTAGE OF UTERUS  yrs ago   DIRECT LARYNGOSCOPY Right 10/04/2022   Procedure: DIRECT LARYNGOSCOPY;  Surgeon: Llewellyn Gerard LABOR, DO;  Location: [facility] OR;  Service: ENT;  Laterality: Right;   ENDOBRONCHIAL ULTRASOUND Bilateral 03/17/2024   Procedure: ENDOBRONCHIAL ULTRASOUND (EBUS);  Surgeon: Shelah Lamar RAMAN, MD;  Location: [facility] ENDOSCOPY;  Service: Pulmonary;  Laterality: Bilateral;   ESOPHAGOGASTRODUODENOSCOPY (EGD) WITH PROPOFOL  N/A 06/19/2022   Procedure: ESOPHAGOGASTRODUODENOSCOPY (EGD) WITH PROPOFOL ;  Surgeon: Elicia Claw, MD;  Location: [facility] ENDOSCOPY;  Service: Gastroenterology;  Laterality: N/A;   EYE SURGERY  2020   Cataracts removed   FIDUCIAL MARKER PLACEMENT  08/07/2023   Procedure: FIDUCIAL MARKER PLACEMENT;  Surgeon: Shelah Lamar RAMAN, MD;  Location: [facility] ENDOSCOPY;  Service: Pulmonary;;   ICD GENERATOR REMOVAL N/A 03/14/2024   Procedure: ICD GENERATOR REMOVAL;  Surgeon: Waddell Danelle ORN, MD;  [facility]: [facility] INVASIVE CV LAB;  Service: Cardiovascular;  Laterality: N/A;   IMPACTION REMOVAL  06/19/2022   Procedure: IMPACTION REMOVAL;  Surgeon: Elicia Claw, MD;  Location: [facility] ENDOSCOPY;  Service: Gastroenterology;;   IMPLANTABLE CARDIOVERTER DEFIBRILLATOR GENERATOR CHANGE N/A 02/04/2013   Procedure: IMPLANTABLE CARDIOVERTER DEFIBRILLATOR GENERATOR CHANGE;  Surgeon: Danelle ORN Waddell, MD;  Location: [facility] CATH LAB;  Service: Cardiovascular;  Laterality: N/A;   IR IMAGING GUIDED PORT INSERTION  10/27/2022   JOINT REPLACEMENT  2019, 2022, 2023   Both hips, both knees   LYMPH NODE BIOPSY Right 10/04/2022   Procedure: EXCISIONAL OF RIGHT DEEP CERVICAL LYMPH NODE;  Surgeon: Llewellyn Gerard LABOR, DO;  Location: [facility] OR;  Service: ENT;  Laterality: Right;   SHOULDER ARTHROSCOPY Right 2015   TOTAL HIP ARTHROPLASTY Right 12/18/2017    Procedure: RIGHT TOTAL HIP ARTHROPLASTY;  Surgeon: Josefina Chew, MD;  [facility]: [facility] OR;  Service: Orthopedics;  Laterality: Right;   TOTAL HIP ARTHROPLASTY Left 04/19/2021   Procedure: TOTAL HIP ARTHROPLASTY;  Surgeon: Josefina Chew, MD;  Location: [facility] ORS;  Service: Orthopedics;  Laterality: Left;   TOTAL KNEE ARTHROPLASTY Right 05/28/2018   Procedure: TOTAL KNEE ARTHROPLASTY;  Surgeon: Josefina Chew, MD;  Location: [facility] ORS;  Service: Orthopedics;  Laterality: Right;  Adductor Block   TOTAL KNEE ARTHROPLASTY Left 08/12/2021   Procedure: TOTAL KNEE ARTHROPLASTY;  Surgeon: Josefina Chew, MD;  Location: [facility] ORS;  Service: Orthopedics;  Laterality: Left;   TUBAL LIGATION Bilateral yrs ago   VIDEO BRONCHOSCOPY WITH ENDOBRONCHIAL NAVIGATION Bilateral 03/17/2024   Procedure: VIDEO BRONCHOSCOPY WITH ENDOBRONCHIAL NAVIGATION;  Surgeon: Shelah Lamar RAMAN, MD;  Location: [facility] ENDOSCOPY;  Service: Pulmonary;  Laterality: Bilateral;  need EBUS scope also   VIDEO BRONCHOSCOPY WITH RADIAL ENDOBRONCHIAL ULTRASOUND  08/07/2023   Procedure: VIDEO BRONCHOSCOPY WITH RADIAL ENDOBRONCHIAL ULTRASOUND;  Surgeon: Shelah Lamar RAMAN, MD;  Location: [facility] ENDOSCOPY;  Service: Pulmonary;;   WISDOM TOOTH EXTRACTION  Prior to Admission medications  Medication Sig Start Date End Date Taking? Authorizing Provider  apixaban  (ELIQUIS ) 5 MG TABS tablet Take 1 tablet (5 mg total) by mouth 2 (two) times daily. 04/14/24  Yes Waddell Danelle ORN, MD  carvedilol  (COREG ) 12.5 MG tablet Take 12.5 mg by mouth 2 (two) times daily with a meal.    Yes [provider]  cyanocobalamin  (VITAMIN B12) 1000 MCG tablet Take 1,000 mcg by mouth daily.   Yes [provider]  cyclobenzaprine (FLEXERIL) 5 MG tablet Take 5 mg by mouth 3 (three) times daily as needed. 05/27/24  Yes [provider]  gabapentin  (NEURONTIN ) 300 MG capsule Take 300 mg by mouth 3 (three) times daily.   Yes [provider]  loratadine  (CLARITIN ) 10 MG  tablet Take 10 mg by mouth daily.   Yes [provider]  ramipril  (ALTACE ) 2.5 MG tablet Take 2.5 mg by mouth daily.     Yes [provider]  simvastatin  (ZOCOR ) 40 MG tablet Take 40 mg by mouth daily.   Yes [provider]  traZODone  (DESYREL ) 50 MG tablet Take 50 mg by mouth at bedtime.    Yes [provider]  zonisamide  (ZONEGRAN ) 50 MG capsule Take 50-100 mg by mouth See admin instructions. Take 50mg  by mouth in the morning and 100mg  at night. 09/13/17  Yes [provider]  HYDROcodone -acetaminophen  (NORCO/VICODIN) 5-325 MG tablet Take 1 tablet by mouth every 6 (six) hours as needed for moderate pain (pain score 4-6). Patient not taking: Reported on 07/17/2024 06/04/24   Kale, Gautam Kishore, MD  ipratropium (ATROVENT  HFA) 17 MCG/ACT inhaler Inhale 2 puffs into the lungs every 6 (six) hours as needed for wheezing. Patient not taking: Reported on 07/17/2024 04/25/24   Onesimo Emaline Brink, MD    Scheduled Meds:  pantoprazole  (PROTONIX ) IV  40 mg Intravenous Q24H   Continuous Infusions: PRN Meds:.acetaminophen  **OR** acetaminophen , morphine  injection  Allergies as of 07/17/2024 - Review Complete 07/17/2024  Allergen Reaction Noted   Sumatriptan succinate Other (See Comments) 09/27/2015    Family History  Problem Relation Age of Onset   Hypertension Mother    Thyroid  disease Mother    Alzheimer's disease Mother    Hearing loss Mother    Coronary artery disease Father    Pulmonary embolism Father    Arthritis Father    Heart disease Father    Congestive Heart Failure Maternal Grandmother    Hypertension Maternal Grandmother    Heart attack Maternal Grandfather    Other Maternal Grandfather        carotid disease   Dementia Paternal Grandmother    Other Paternal Grandfather 51       accident   Cancer Sister     Social History   Socioeconomic History   Marital status: Single    Spouse name: Not on file   Number of children: 0    Years of education: 14   Highest education level: Not on file  Occupational History   Not on file  Tobacco Use   Smoking status: Former    Current packs/day: 0.00    Types: Cigarettes    Start date: 05/21/1966    Quit date: 05/21/1996    Years since quitting: 28.1   Smokeless tobacco: Never  Vaping Use   Vaping status: Never Used  Substance and Sexual Activity   Alcohol use: No   Drug use: Never   Sexual activity: Not on file  Other Topics Concern   Not  on file  Social History Narrative   Not on file   Social Drivers of Health   Tobacco Use: Medium Risk (03/17/2024)   Patient History    Smoking Tobacco Use: Former    Smokeless Tobacco Use: Never    Passive Exposure: Not on file  Financial Resource Strain: Not on file  Food Insecurity: No Food Insecurity (08/28/2023)   Hunger Vital Sign    Worried About Running Out of Food in the Last Year: Never true    Ran Out of Food in the Last Year: Never true  Transportation Needs: No Transportation Needs (08/28/2023)   PRAPARE - Administrator, Civil Service (Medical): No    Lack of Transportation (Non-Medical): No  Physical Activity: Not on file  Stress: Not on file  Social Connections: Not on file  Intimate Partner Violence: Not At Risk (08/28/2023)   Humiliation, Afraid, Rape, and Kick questionnaire    Fear of Current or Ex-Partner: No    Emotionally Abused: No    Physically Abused: No    Sexually Abused: No  Depression (PHQ2-9): Low Risk (06/04/2024)   Depression (PHQ2-9)    PHQ-2 Score: 0  Alcohol Screen: Not on file  Housing: Low Risk (08/28/2023)   Housing Stability Vital Sign    Unable to Pay for Housing in the Last Year: No    Number of Times Moved in the Last Year: 0    Homeless in the Last Year: No  Utilities: Not At Risk (08/28/2023)   AHC Utilities    Threatened with loss of utilities: No  Health Literacy: Not on file    Review of Systems: All negative except as stated above in HPI.  Physical  Exam: Vital signs: Vitals:   07/18/24 0529 07/18/24 0953  BP:  126/65  Pulse:  83  Resp:  16  Temp: 98.4 F (36.9 C) 97.7 F (36.5 C)  SpO2:  96%     General:  lethargic, elderly, thin, no acute distress, pleasant   Head: normocephalic, atraumatic Eyes: anicteric sclera ENT: oropharynx clear Neck: supple, nontender Lungs:  Clear throughout to auscultation.   No wheezes, crackles, or rhonchi. No acute distress. Heart:  Regular rate and rhythm; no murmurs, clicks, rubs,  or gallops. Abdomen: LLQ tenderness with guarding, otherwise nontender, soft, nondistended, +BS  Rectal:  Deferred Ext: no edema  GI:  Lab Results: Recent Labs    07/17/24 1430 07/18/24 0527  WBC 4.5 3.5*  HGB 15.2* 13.6  HCT 43.7 40.1  PLT 148* 114*   BMET Recent Labs    07/17/24 1430 07/18/24 0527  NA 137 141  K 3.6 3.6  CL 101 107  CO2 26 25  GLUCOSE 113* 85  BUN 8 7*  CREATININE 0.53 0.50  CALCIUM  9.8 9.0   LFT Recent Labs    07/17/24 1430  PROT 6.8  ALBUMIN  4.1  AST 24  ALT 15  ALKPHOS 96  BILITOT 1.1   PT/INR Recent Labs    07/18/24 0527  LABPROT 14.8  INR 1.1    Impression/Plan: Esophageal obstruction in the setting of metastatic lung cancer-esophageal findings concerning for extrinsic compression vs intrinsic mass.  Suspect the obstruction is due to an extrinsic source but repeat EGD with possible dilation (would avoid dilation unless unable to advance endoscope into stomach) needed to remove debris and evaluate esophageal mucosa.  Eliquis  on hold.  Keep n.p.o. due to risk of aspiration from further enteral nutrition at this time.  EGD tomorrow morning by  Dr. Elicia.    LOS: 1 day   Jerrell JAYSON Sol  07/18/2024, 10:04 AM  Questions please call [phone]

## 2024-07-18 NOTE — H&P (View-Only) (Signed)
 Referring Provider: Dr. Ginger Primary Care Physician:  Venancio Pock, PA-C Primary Gastroenterologist:  Sampson  Reason for Consultation:  Esophageal Obstruction; Dysphagia  HPI: Rachel Bates is a 79 y.o. female with history of lung cancer on chemoradiation and history of food impaction in 2023 and EGD at that time showed an esophageal stricture, duodenal ulcers and gastritis.  Reports that for the last 40 days she has been unable to eat anything that was not liquid or pured.  Any soft food or solid food would hang up and she would have to vomited up.  Denies hematemesis melena or hematochezia.  She reports losing approximately 10 pounds during this time.  Denies abdominal pain.  CT scan shows an esophageal obstruction with air-fluid level and debris question extrinsic compression versus mass versus stricture.  A right hilar mass noted and multiple pulmonary nodules concerning for pulmonary metastasis as well as liver metastasis and bone metastasis.  On Eliquis  as outpatient.  Past Medical History:  Diagnosis Date   AICD (automatic cardioverter/defibrillator) present dual   Medtronic - original placed 2007, generator change 2014, generator extraction 03/14/2024   Anemia    Anticoagulant long-term use    eliquis    Anxiety    Arthralgia of multiple joints    Arthritis pain    Benign hypertensive heart disease    CAD (coronary artery disease) primary cardiologist-- dr danelle taylor   MI and 2 stents 1995 in [place]    Cancer [facility])    Cataract ?   Surgery 2020   CHF NYHA class II, chronic, systolic (HCC)    followed by dr danelle taylor   Complication of anesthesia    Degenerative disc disease, lumbar    Depression    Difficult intubation    Dyslipidemia    History of basal cell carcinoma (BCC) excision    right ankle area s/p  excision in office 06/ 2019  in office   History of DVT of lower extremity yrs ago before 2012   History of MI (myocardial infarction) 1995  in [place]    History of pulmonary embolus (PE) 2012   History of ventricular tachycardia    Hyperlipidemia    Hypersomnia    Hypertension    Ischemic cardiomyopathy    Migraines    Myocardial infarction (HCC)    OA (osteoarthritis)    all over   PAF (paroxysmal atrial fibrillation) (HCC)    Pneumonia    02/2023   Primary localized osteoarthritis of right hip 12/18/2017   Primary localized osteoarthritis of right knee 05/28/2018   S/P coronary artery stent placement 1995   in [facility]   05-21-2018 per pt x2  stents in same coronary artery (unsure BM or DES)   Vitamin D  deficiency disease     Past Surgical History:  Procedure Laterality Date   BIOPSY  06/19/2022   Procedure: BIOPSY;  Surgeon: Elicia Claw, MD;  Location: [facility] ENDOSCOPY;  Service: Gastroenterology;;   BRONCHIAL BIOPSY  08/07/2023   Procedure: BRONCHIAL BIOPSIES;  Surgeon: Shelah Lamar RAMAN, MD;  Location: [facility] ENDOSCOPY;  Service: Pulmonary;;   BRONCHIAL BRUSHINGS  08/07/2023   Procedure: BRONCHIAL BRUSHINGS;  Surgeon: Shelah Lamar RAMAN, MD;  Location: [facility] ENDOSCOPY;  Service: Pulmonary;;   BRONCHIAL BRUSHINGS  03/17/2024   Procedure: BRONCHOSCOPY, WITH BRUSH BIOPSY;  Surgeon: Shelah Lamar RAMAN, MD;  Location: [facility] ENDOSCOPY;  Service: Pulmonary;;   BRONCHIAL NEEDLE ASPIRATION BIOPSY  08/07/2023   Procedure: BRONCHIAL NEEDLE ASPIRATION BIOPSIES;  Surgeon: Shelah Lamar RAMAN, MD;  Location:  [facility] ENDOSCOPY;  Service: Pulmonary;;   BRONCHIAL NEEDLE ASPIRATION BIOPSY  03/17/2024   Procedure: BRONCHOSCOPY, WITH NEEDLE ASPIRATION BIOPSY;  Surgeon: Shelah Lamar RAMAN, MD;  Location: [facility] ENDOSCOPY;  Service: Pulmonary;;   CARDIAC DEFIBRILLATOR PLACEMENT  2007   CARPAL TUNNEL RELEASE Right 02/26/2024   Procedure: CARPAL TUNNEL RELEASE;  Surgeon: Josefina Chew, MD;  Location: [facility] OR;  Service: Orthopedics;  Laterality: Right;   CATARACT EXTRACTION, BILATERAL     COLONOSCOPY  04/14/2013   colonic polyp, status post polypectomy. Mild panocolonic diverticulosis.  Small internal hemorrhoids   CRYOTHERAPY  03/17/2024   Procedure: CRYOTHERAPY;  Surgeon: Shelah Lamar RAMAN, MD;  Location: [facility] ENDOSCOPY;  Service: Pulmonary;;   DILATION AND CURETTAGE OF UTERUS  yrs ago   DIRECT LARYNGOSCOPY Right 10/04/2022   Procedure: DIRECT LARYNGOSCOPY;  Surgeon: Llewellyn Gerard LABOR, DO;  Location: [facility] OR;  Service: ENT;  Laterality: Right;   ENDOBRONCHIAL ULTRASOUND Bilateral 03/17/2024   Procedure: ENDOBRONCHIAL ULTRASOUND (EBUS);  Surgeon: Shelah Lamar RAMAN, MD;  Location: [facility] ENDOSCOPY;  Service: Pulmonary;  Laterality: Bilateral;   ESOPHAGOGASTRODUODENOSCOPY (EGD) WITH PROPOFOL  N/A 06/19/2022   Procedure: ESOPHAGOGASTRODUODENOSCOPY (EGD) WITH PROPOFOL ;  Surgeon: Elicia Claw, MD;  Location: [facility] ENDOSCOPY;  Service: Gastroenterology;  Laterality: N/A;   EYE SURGERY  2020   Cataracts removed   FIDUCIAL MARKER PLACEMENT  08/07/2023   Procedure: FIDUCIAL MARKER PLACEMENT;  Surgeon: Shelah Lamar RAMAN, MD;  Location: [facility] ENDOSCOPY;  Service: Pulmonary;;   ICD GENERATOR REMOVAL N/A 03/14/2024   Procedure: ICD GENERATOR REMOVAL;  Surgeon: Waddell Danelle ORN, MD;  [facility] INVASIVE CV LAB;  Service: Cardiovascular;  Laterality: N/A;   IMPACTION REMOVAL  06/19/2022   Procedure: IMPACTION REMOVAL;  Surgeon: Elicia Claw, MD;  Location: [facility] ENDOSCOPY;  Service: Gastroenterology;;   IMPLANTABLE CARDIOVERTER DEFIBRILLATOR GENERATOR CHANGE N/A 02/04/2013   Procedure: IMPLANTABLE CARDIOVERTER DEFIBRILLATOR GENERATOR CHANGE;  Surgeon: Danelle ORN Waddell, MD;  Location: [facility] CATH LAB;  Service: Cardiovascular;  Laterality: N/A;   IR IMAGING GUIDED PORT INSERTION  10/27/2022   JOINT REPLACEMENT  2019, 2022, 2023   Both hips, both knees   LYMPH NODE BIOPSY Right 10/04/2022   Procedure: EXCISIONAL OF RIGHT DEEP CERVICAL LYMPH NODE;  Surgeon: Llewellyn Gerard LABOR, DO;  Location: [facility] OR;  Service: ENT;  Laterality: Right;   SHOULDER ARTHROSCOPY Right 2015   TOTAL HIP ARTHROPLASTY Right 12/18/2017    Procedure: RIGHT TOTAL HIP ARTHROPLASTY;  Surgeon: Josefina Chew, MD;  [facility] OR;  Service: Orthopedics;  Laterality: Right;   TOTAL HIP ARTHROPLASTY Left 04/19/2021   Procedure: TOTAL HIP ARTHROPLASTY;  Surgeon: Josefina Chew, MD;  Location: [facility] ORS;  Service: Orthopedics;  Laterality: Left;   TOTAL KNEE ARTHROPLASTY Right 05/28/2018   Procedure: TOTAL KNEE ARTHROPLASTY;  Surgeon: Josefina Chew, MD;  Location: [facility] ORS;  Service: Orthopedics;  Laterality: Right;  Adductor Block   TOTAL KNEE ARTHROPLASTY Left 08/12/2021   Procedure: TOTAL KNEE ARTHROPLASTY;  Surgeon: Josefina Chew, MD;  Location: [facility] ORS;  Service: Orthopedics;  Laterality: Left;   TUBAL LIGATION Bilateral yrs ago   VIDEO BRONCHOSCOPY WITH ENDOBRONCHIAL NAVIGATION Bilateral 03/17/2024   Procedure: VIDEO BRONCHOSCOPY WITH ENDOBRONCHIAL NAVIGATION;  Surgeon: Shelah Lamar RAMAN, MD;  Location: [facility] ENDOSCOPY;  Service: Pulmonary;  Laterality: Bilateral;  need EBUS scope also   VIDEO BRONCHOSCOPY WITH RADIAL ENDOBRONCHIAL ULTRASOUND  08/07/2023   Procedure: VIDEO BRONCHOSCOPY WITH RADIAL ENDOBRONCHIAL ULTRASOUND;  Surgeon: Shelah Lamar RAMAN, MD;  Location: [facility] ENDOSCOPY;  Service: Pulmonary;;   WISDOM TOOTH EXTRACTION  Prior to Admission medications  Medication Sig Start Date End Date Taking? Authorizing Provider  apixaban  (ELIQUIS ) 5 MG TABS tablet Take 1 tablet (5 mg total) by mouth 2 (two) times daily. 04/14/24  Yes Waddell Danelle ORN, MD  carvedilol  (COREG ) 12.5 MG tablet Take 12.5 mg by mouth 2 (two) times daily with a meal.    Yes [provider]  cyanocobalamin  (VITAMIN B12) 1000 MCG tablet Take 1,000 mcg by mouth daily.   Yes [provider]  cyclobenzaprine (FLEXERIL) 5 MG tablet Take 5 mg by mouth 3 (three) times daily as needed. 05/27/24  Yes [provider]  gabapentin  (NEURONTIN ) 300 MG capsule Take 300 mg by mouth 3 (three) times daily.   Yes [provider]  loratadine  (CLARITIN ) 10 MG  tablet Take 10 mg by mouth daily.   Yes [provider]  ramipril  (ALTACE ) 2.5 MG tablet Take 2.5 mg by mouth daily.     Yes [provider]  simvastatin  (ZOCOR ) 40 MG tablet Take 40 mg by mouth daily.   Yes [provider]  traZODone  (DESYREL ) 50 MG tablet Take 50 mg by mouth at bedtime.    Yes [provider]  zonisamide  (ZONEGRAN ) 50 MG capsule Take 50-100 mg by mouth See admin instructions. Take 50mg  by mouth in the morning and 100mg  at night. 09/13/17  Yes [provider]  HYDROcodone -acetaminophen  (NORCO/VICODIN) 5-325 MG tablet Take 1 tablet by mouth every 6 (six) hours as needed for moderate pain (pain score 4-6). Patient not taking: Reported on 07/17/2024 06/04/24   Kale, Gautam Kishore, MD  ipratropium (ATROVENT  HFA) 17 MCG/ACT inhaler Inhale 2 puffs into the lungs every 6 (six) hours as needed for wheezing. Patient not taking: Reported on 07/17/2024 04/25/24   Onesimo Emaline Brink, MD    Scheduled Meds:  pantoprazole  (PROTONIX ) IV  40 mg Intravenous Q24H   Continuous Infusions: PRN Meds:.acetaminophen  **OR** acetaminophen , morphine  injection  Allergies as of 07/17/2024 - Review Complete 07/17/2024  Allergen Reaction Noted   Sumatriptan succinate Other (See Comments) 09/27/2015    Family History  Problem Relation Age of Onset   Hypertension Mother    Thyroid  disease Mother    Alzheimer's disease Mother    Hearing loss Mother    Coronary artery disease Father    Pulmonary embolism Father    Arthritis Father    Heart disease Father    Congestive Heart Failure Maternal Grandmother    Hypertension Maternal Grandmother    Heart attack Maternal Grandfather    Other Maternal Grandfather        carotid disease   Dementia Paternal Grandmother    Other Paternal Grandfather 51       accident   Cancer Sister     Social History   Socioeconomic History   Marital status: Single    Spouse name: Not on file   Number of children: 0    Years of education: 14   Highest education level: Not on file  Occupational History   Not on file  Tobacco Use   Smoking status: Former    Current packs/day: 0.00    Types: Cigarettes    Start date: 05/21/1966    Quit date: 05/21/1996    Years since quitting: 28.1   Smokeless tobacco: Never  Vaping Use   Vaping status: Never Used  Substance and Sexual Activity   Alcohol use: No   Drug use: Never   Sexual activity: Not on file  Other Topics Concern   Not  on file  Social History Narrative   Not on file   Social Drivers of Health   Tobacco Use: Medium Risk (03/17/2024)   Patient History    Smoking Tobacco Use: Former    Smokeless Tobacco Use: Never    Passive Exposure: Not on file  Financial Resource Strain: Not on file  Food Insecurity: No Food Insecurity (08/28/2023)   Hunger Vital Sign    Worried About Running Out of Food in the Last Year: Never true    Ran Out of Food in the Last Year: Never true  Transportation Needs: No Transportation Needs (08/28/2023)   PRAPARE - Administrator, Civil Service (Medical): No    Lack of Transportation (Non-Medical): No  Physical Activity: Not on file  Stress: Not on file  Social Connections: Not on file  Intimate Partner Violence: Not At Risk (08/28/2023)   Humiliation, Afraid, Rape, and Kick questionnaire    Fear of Current or Ex-Partner: No    Emotionally Abused: No    Physically Abused: No    Sexually Abused: No  Depression (PHQ2-9): Low Risk (06/04/2024)   Depression (PHQ2-9)    PHQ-2 Score: 0  Alcohol Screen: Not on file  Housing: Low Risk (08/28/2023)   Housing Stability Vital Sign    Unable to Pay for Housing in the Last Year: No    Number of Times Moved in the Last Year: 0    Homeless in the Last Year: No  Utilities: Not At Risk (08/28/2023)   AHC Utilities    Threatened with loss of utilities: No  Health Literacy: Not on file    Review of Systems: All negative except as stated above in HPI.  Physical  Exam: Vital signs: Vitals:   07/18/24 0529 07/18/24 0953  BP:  126/65  Pulse:  83  Resp:  16  Temp: 98.4 F (36.9 C) 97.7 F (36.5 C)  SpO2:  96%     General:  lethargic, elderly, thin, no acute distress, pleasant   Head: normocephalic, atraumatic Eyes: anicteric sclera ENT: oropharynx clear Neck: supple, nontender Lungs:  Clear throughout to auscultation.   No wheezes, crackles, or rhonchi. No acute distress. Heart:  Regular rate and rhythm; no murmurs, clicks, rubs,  or gallops. Abdomen: LLQ tenderness with guarding, otherwise nontender, soft, nondistended, +BS  Rectal:  Deferred Ext: no edema  GI:  Lab Results: Recent Labs    07/17/24 1430 07/18/24 0527  WBC 4.5 3.5*  HGB 15.2* 13.6  HCT 43.7 40.1  PLT 148* 114*   BMET Recent Labs    07/17/24 1430 07/18/24 0527  NA 137 141  K 3.6 3.6  CL 101 107  CO2 26 25  GLUCOSE 113* 85  BUN 8 7*  CREATININE 0.53 0.50  CALCIUM  9.8 9.0   LFT Recent Labs    07/17/24 1430  PROT 6.8  ALBUMIN  4.1  AST 24  ALT 15  ALKPHOS 96  BILITOT 1.1   PT/INR Recent Labs    07/18/24 0527  LABPROT 14.8  INR 1.1    Impression/Plan: Esophageal obstruction in the setting of metastatic lung cancer-esophageal findings concerning for extrinsic compression vs intrinsic mass.  Suspect the obstruction is due to an extrinsic source but repeat EGD with possible dilation (would avoid dilation unless unable to advance endoscope into stomach) needed to remove debris and evaluate esophageal mucosa.  Eliquis  on hold.  Keep n.p.o. due to risk of aspiration from further enteral nutrition at this time.  EGD tomorrow morning by  Dr. Elicia.    LOS: 1 day   Jerrell JAYSON Sol  07/18/2024, 10:04 AM  Questions please call [phone]

## 2024-07-19 ENCOUNTER — Encounter (HOSPITAL_COMMUNITY)
Admission: EM | Disposition: A | Discharge status: Hospice | Payer: Self-pay | Source: Ambulatory Visit | Attending: Family Medicine

## 2024-07-19 ENCOUNTER — Encounter (HOSPITAL_COMMUNITY): Payer: Self-pay | Admitting: Anesthesiology

## 2024-07-19 ENCOUNTER — Inpatient Hospital Stay (HOSPITAL_COMMUNITY): Payer: Self-pay | Admitting: Anesthesiology

## 2024-07-19 ENCOUNTER — Encounter (HOSPITAL_COMMUNITY): Payer: Self-pay | Admitting: Internal Medicine

## 2024-07-19 DIAGNOSIS — I5020 Unspecified systolic (congestive) heart failure: Secondary | ICD-10-CM

## 2024-07-19 DIAGNOSIS — I2510 Atherosclerotic heart disease of native coronary artery without angina pectoris: Secondary | ICD-10-CM | POA: Diagnosis not present

## 2024-07-19 DIAGNOSIS — I110 Hypertensive heart disease with heart failure: Secondary | ICD-10-CM | POA: Diagnosis not present

## 2024-07-19 DIAGNOSIS — K222 Esophageal obstruction: Secondary | ICD-10-CM | POA: Diagnosis not present

## 2024-07-19 DIAGNOSIS — Z87891 Personal history of nicotine dependence: Secondary | ICD-10-CM | POA: Diagnosis not present

## 2024-07-19 HISTORY — PX: ESOPHAGOGASTRODUODENOSCOPY: SHX5428

## 2024-07-19 HISTORY — PX: FOREIGN BODY REMOVAL: SHX962

## 2024-07-19 LAB — BASIC METABOLIC PANEL WITH GFR
Anion gap: 8 (ref 5–15)
BUN: 6 mg/dL — ABNORMAL LOW (ref 8–23)
CO2: 27 mmol/L (ref 22–32)
Calcium: 9.3 mg/dL (ref 8.9–10.3)
Chloride: 104 mmol/L (ref 98–111)
Creatinine, Ser: 0.55 mg/dL (ref 0.44–1.00)
GFR, Estimated: >60 mL/min
Glucose, Bld: 92 mg/dL (ref 70–99)
Potassium: 4 mmol/L (ref 3.5–5.1)
Sodium: 139 mmol/L (ref 135–145)

## 2024-07-19 LAB — CBC
HCT: 39.1 % (ref 36.0–46.0)
Hemoglobin: 12.9 g/dL (ref 12.0–15.0)
MCH: 30.7 pg (ref 26.0–34.0)
MCHC: 33 g/dL (ref 30.0–36.0)
MCV: 93.1 fL (ref 80.0–100.0)
Platelets: 106 K/uL — ABNORMAL LOW (ref 150–400)
RBC: 4.2 MIL/uL (ref 3.87–5.11)
RDW: 13.8 % (ref 11.5–15.5)
WBC: 3.8 K/uL — ABNORMAL LOW (ref 4.0–10.5)
nRBC: 0 % (ref 0.0–0.2)

## 2024-07-19 LAB — PHOSPHORUS: Phosphorus: 3.2 mg/dL (ref 2.5–4.6)

## 2024-07-19 LAB — URINE CULTURE: Culture: 50000 — AB

## 2024-07-19 MED ORDER — APIXABAN 5 MG PO TABS: 5.0000 mg | ORAL_TABLET | Freq: Two times a day (BID) | ORAL | Status: DC

## 2024-07-19 MED ORDER — PROPOFOL 10 MG/ML IV BOLUS
INTRAVENOUS | Status: AC
  Filled 2024-07-19: qty 20

## 2024-07-19 MED ORDER — LIDOCAINE HCL (CARDIAC) PF 100 MG/5ML IV SOSY
PREFILLED_SYRINGE | INTRAVENOUS | Status: DC | PRN
  Administered 2024-07-19: 40 mg via INTRAVENOUS

## 2024-07-19 MED ORDER — ONDANSETRON HCL 4 MG/2ML IJ SOLN
INTRAMUSCULAR | Status: DC | PRN
  Administered 2024-07-19: 4 mg via INTRAVENOUS

## 2024-07-19 MED ORDER — APIXABAN 5 MG PO TABS
5.0000 mg | ORAL_TABLET | Freq: Two times a day (BID) | ORAL | Status: DC
  Administered 2024-07-20 – 2024-07-21 (×3): 5 mg via ORAL
  Filled 2024-07-19 (×3): qty 1

## 2024-07-19 MED ORDER — CARVEDILOL 12.5 MG PO TABS
12.5000 mg | ORAL_TABLET | Freq: Two times a day (BID) | ORAL | Status: DC
  Administered 2024-07-19 – 2024-07-21 (×5): 12.5 mg via ORAL
  Filled 2024-07-19 (×6): qty 1

## 2024-07-19 MED ORDER — SODIUM CHLORIDE 0.9 % IV SOLN: INTRAVENOUS | Status: DC | PRN

## 2024-07-19 MED ORDER — TRAZODONE HCL 50 MG PO TABS
50.0000 mg | ORAL_TABLET | Freq: Every day | ORAL | Status: DC
  Administered 2024-07-19 – 2024-07-21 (×3): 50 mg via ORAL
  Filled 2024-07-19 (×3): qty 1

## 2024-07-19 MED ORDER — ENSURE PLUS HIGH PROTEIN PO LIQD
237.0000 mL | Freq: Two times a day (BID) | ORAL | Status: DC
  Administered 2024-07-19 – 2024-07-20 (×2): 237 mL via ORAL

## 2024-07-19 MED ORDER — RAMIPRIL 1.25 MG PO CAPS
2.5000 mg | ORAL_CAPSULE | Freq: Every day | ORAL | Status: DC
  Administered 2024-07-19 – 2024-07-21 (×3): 2.5 mg via ORAL
  Filled 2024-07-19 (×4): qty 2

## 2024-07-19 MED ORDER — SODIUM CHLORIDE 0.9 % IV SOLN: INTRAVENOUS | Status: DC

## 2024-07-19 MED ORDER — PROPOFOL 500 MG/50ML IV EMUL
INTRAVENOUS | Status: DC | PRN
  Administered 2024-07-19: 30 mg via INTRAVENOUS
  Administered 2024-07-19: 125 ug/kg/min via INTRAVENOUS

## 2024-07-19 MED ORDER — SIMVASTATIN 20 MG PO TABS
40.0000 mg | ORAL_TABLET | Freq: Every day | ORAL | Status: DC
  Filled 2024-07-19: qty 2

## 2024-07-19 NOTE — Anesthesia Postprocedure Evaluation (Signed)
"   Anesthesia Post Note  Patient: Rachel Bates  Procedure(s) Performed: EGD (ESOPHAGOGASTRODUODENOSCOPY) REMOVAL, FOREIGN BODY Balloon dilation wire-guided     Patient location during evaluation: PACU Anesthesia Type: MAC Level of consciousness: awake and alert Pain management: pain level controlled Vital Signs Assessment: post-procedure vital signs reviewed and stable Respiratory status: spontaneous breathing, nonlabored ventilation and respiratory function stable Cardiovascular status: blood pressure returned to baseline and stable Postop Assessment: no apparent nausea or vomiting Anesthetic complications: no   No notable events documented.  Last Vitals:  Vitals:   07/19/24 0824 07/19/24 0830  BP: (!) 146/64 122/67  Pulse: 89 96  Resp: 18 15  Temp: (!) 36.3 C   SpO2: 100% 100%    Last Pain:  Vitals:   07/19/24 0830  TempSrc: Temporal  PainSc: 0-No pain                 Almarie HERO Carnel Stegman      "

## 2024-07-19 NOTE — Progress Notes (Signed)
 " Progress Note   Patient: Rachel Bates FMW:[id number] DOB: March 04, 1946 DOA: 07/17/2024     2 DOS: the patient was seen and examined on 07/19/2024    Brief hospital course:  Rachel Bates is a 79 y.o. 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  Supposed to start immunotherapy in the near future according to patient.  She was sent to the ED from her oncologist office due to reported dysphagia, weight loss.CT revealed esophageal obstruction, among other findings.  Gastroenterology consulted.  Patient underwent EGD on 1/10 and this revealed an esophageal stricture.  Balloon dilation was done.  Assessment and Plan:  Esophageal dysphagia/obstruction CT scan raise concern for distal esophageal obstruction, possibly due to mass, stricture or extrinsic compression. Of note, air-fluid level and debris in the esophagus with esophageal dilatation down to the level of about T8 with distal decompression of the esophagus. Gastroenterology was consulted and patient underwent an EGD on 1/10. Found to have severe stenosis of the midesophagus with retained food in esophagus. Retained food was removed by a Madelyn net.  Stenosis was dilated to 11 mm.  No bleeding, mucosal tear or perforation noted after dilation.  Diffuse mild inflammation was found in the entire examined stomach. -For barium swallow on 1/12. - Full liquid diet for now.  Will crush medications. - EGD to be repeated in 2 weeks.   Squamous cell lung cancer, metastatic Under the care of Dr. Onesimo.   She completed chemoradiation last year.  Supposed to start immunotherapy soon.   CT scan done on admission raises concern for progression of her cancer.   Found to have right supraclavicular mediastinal and right hilar lymphadenopathy.  Right hilar mass with right middle lobe consolidation, likely  postobstructive changes, were noted.   Multiple pulmonary nodules seen which were new compared to previous imaging studies.   Multiple liver lesions also noted concerning for metastases.  Seventh rib lesion also noted concerning for bone metastases.  - Oncology aware of hospitalization.  For outpatient follow-up.   History of paroxysmal atrial fibrillation/history of pulmonary embolism No PE noted on current study.   -Home beta-blocker resumed. -Apixaban  will be resumed on 1/11 per GI recs.   History of non-Hodgkin's lymphoma She completed treatment in 2024.   Reportedly in remission. - Outpatient follow-up.   History of migraine headaches -On Zonegran , which will be held as patient cannot swallow capsules at this time.   Essential hypertension Blood pressures are reasonably well-controlled. -Home medications (ramipril , carvedilol ) resumed.  Insomnia -Resume home trazodone .      Subjective: Patient seen post EGD. She has no complaints.  Denies abdominal pain.  Physical Exam: BP 122/64 (BP Location: Left Arm)   Pulse 85   Temp 98.4 F (36.9 C) (Oral)   Resp 18   Ht 5' 7 (1.702 m)   Wt 57.3 kg   SpO2 96%   BMI 19.78 kg/m     General: Alert, oriented X3  Eyes: Pupils equal, reactive  Oral cavity: moist mucous membranes  Head: Atraumatic, normocephalic  Neck: supple  Chest: clear to auscultation. No crackles, no wheezes  CVS: S1,S2 RRR. No murmurs  Abd: No distention, soft, non-tender. No masses palpable  Extr: No edema   MSK: No joint deformities or swelling  Neurological: Grossly intact.    Data Reviewed:    Latest Ref Rng & Units  07/19/2024   10:35 AM 07/18/2024    5:27 AM 07/17/2024    2:30 PM  CBC  WBC 4.0 - 10.5 K/uL 3.8  3.5  4.5   Hemoglobin 12.0 - 15.0 g/dL 87.0  86.3  84.7   Hematocrit 36.0 - 46.0 % 39.1  40.1  43.7   Platelets 150 - 400 K/uL 106  114  148       Latest Ref Rng & Units 07/19/2024   10:35 AM 07/18/2024    5:27 AM 07/17/2024    2:30  PM  BMP  Glucose 70 - 99 mg/dL 92  85  886   BUN 8 - 23 mg/dL 6  7  8    Creatinine 0.44 - 1.00 mg/dL 9.44  9.49  9.46   Sodium 135 - 145 mmol/L 139  141  137   Potassium 3.5 - 5.1 mmol/L 4.0  3.6  3.6   Chloride 98 - 111 mmol/L 104  107  101   CO2 22 - 32 mmol/L 27  25  26    Calcium  8.9 - 10.3 mg/dL 9.3  9.0  9.8      Family Communication: n/a  Disposition: Status is: Inpatient Remains inpatient appropriate because: Esophageal obstruction   DVT PPx: SCDs (plan to resume home Eliquis  1/11).      Author: MDALA-GAUSI, Rachel Secrest AGATHA, MD 07/19/2024 1:57 PM  For on call review [email or web address].    "

## 2024-07-19 NOTE — Interval H&P Note (Signed)
 History and Physical Interval Note:  07/19/2024 7:47 AM  Rachel Bates  has presented today for surgery, with the diagnosis of Esophageal Obstruction; Dysphagia.  The various methods of treatment have been discussed with the patient and family. After consideration of risks, benefits and other options for treatment, the patient has consented to  Procedures: EGD (ESOPHAGOGASTRODUODENOSCOPY) (N/A) as a surgical intervention.  The patient's history has been reviewed, patient examined, no change in status, stable for surgery.  I have reviewed the patient's chart and labs.  Questions were answered to the patient's satisfaction.     Rachel Bates

## 2024-07-19 NOTE — Plan of Care (Signed)

## 2024-07-19 NOTE — Op Note (Signed)
 [facility] Patient Name: Rachel Bates Procedure Date: 07/19/2024 MRN: [id number] Attending MD: Layla Lah , MD, [id number] Date of Birth: June 04, 1946 CSN: [id number] Age: 79 Admit Type: Inpatient Procedure:                Upper GI endoscopy Indications:              Dysphagia Providers:                Layla Lah, MD, Robie Breed, RN,                            Haskel Chris, Technician Referring MD:              Medicines:                Sedation Administered by an Anesthesia Professional Complications:            No immediate complications. Estimated Blood Loss:     Estimated blood loss was minimal. Procedure:                Pre-Anesthesia Assessment:                           - Prior to the procedure, a History and Physical                            was performed, and patient medications and                            allergies were reviewed. The patient's tolerance of                            previous anesthesia was also reviewed. The risks                            and benefits of the procedure and the sedation                            options and risks were discussed with the patient.                            All questions were answered, and informed consent                            was obtained. Prior Anticoagulants: The patient has                            taken Eliquis  (apixaban ), last dose was 3 days                            prior to procedure. ASA Grade Assessment: IV - A                            patient with severe systemic disease that is a  constant threat to life. After reviewing the risks                            and benefits, the patient was deemed in                            satisfactory condition to undergo the procedure.                           After obtaining informed consent, the endoscope was                            passed under direct vision. Throughout the                             procedure, the patient's blood pressure, pulse, and                            oxygen saturations were monitored continuously. The                            GIF-H190 ([id number]) Olympus endoscope was introduced                            through the mouth, and advanced to the second part                            of duodenum. The upper GI endoscopy was extremely                            difficult due to stenosis. Successful completion of                            the procedure was aided by withdrawing the scope                            and replacing with the pediatric endoscope. The                            patient tolerated the procedure well. Scope In: Scope Out: Findings:      Food was found in the middle third of the esophagus. Removal was       accomplished with a Roth net.      One benign-appearing, intrinsic severe (stenosis; an endoscope cannot       pass) stenosis was found 30 cm from the incisors. This stenosis measured       6 mm (inner diameter). This narrowing could be from extrinsic       compression as I did not see any evidence of internal inflammation or       stricture. The stenosis was traversed after downsizing scope. A TTS       dilator was passed through the scope. Dilation with a 04-20-11 mm       balloon dilator was performed to 11 mm. The dilation site was examined  With peds endoscope following endoscope reinsertion and showed no       bleeding, mucosal tear or perforation.      A non-obstructing Schatzki ring was found at the gastroesophageal       junction.      A small hiatal hernia was present.      Diffuse mild inflammation was found in the entire examined stomach.      The cardia and gastric fundus were normal on retroflexion.      The duodenal bulb, first portion of the duodenum and second portion of       the duodenum were normal. Impression:               - Food in the middle third of the esophagus.                             Removal was successful.                           - Benign-appearing esophageal stenosis. Dilated.                           - Non-obstructing Schatzki ring.                           - Small hiatal hernia.                           - Chronic gastritis.                           - Normal duodenal bulb, first portion of the                            duodenum and second portion of the duodenum. Moderate Sedation:      Moderate (conscious) sedation was personally administered by an       anesthesia professional. The following parameters were monitored: oxygen       saturation, heart rate, blood pressure, and response to care. Recommendation:           - Return patient to hospital ward for ongoing care.                           - Full liquid diet.                           - Continue present medications.                           - Repeat upper endoscopy in 2 weeks for retreatment. Procedure Code(s):        --- Professional ---                           [phone], Esophagogastroduodenoscopy, flexible,                            transoral; with removal of foreign body(s)  [phone], Esophagogastroduodenoscopy, flexible,                            transoral; with transendoscopic balloon dilation of                            esophagus (less than 30 mm diameter) Diagnosis Code(s):        --- Professional ---                           U81.871J, Food in esophagus causing other injury,                            initial encounter                           K22.2, Esophageal obstruction                           K44.9, Diaphragmatic hernia without obstruction or                            gangrene                           K29.50, Unspecified chronic gastritis without                            bleeding                           R13.10, Dysphagia, unspecified CPT copyright 2022 American [employer] Association. All rights reserved. The codes documented in this report are preliminary  and upon coder review may  be revised to meet current compliance requirements. Layla Lah, MD Layla Lah, MD 07/19/2024 8:34:20 AM Number of Addenda: 0

## 2024-07-19 NOTE — Transfer of Care (Signed)
 Immediate Anesthesia Transfer of Care Note  Patient: Rachel Bates  Procedure(s) Performed: Procedures: EGD (ESOPHAGOGASTRODUODENOSCOPY) (N/A) REMOVAL, FOREIGN BODY Balloon dilation wire-guided  Patient Location: PACU  Anesthesia Type:MAC  Level of Consciousness:  sedated, patient cooperative and responds to stimulation  Airway & Oxygen Therapy:Patient Spontanous Breathing and Patient connected to face mask oxgen  Post-op Assessment:  Report given to PACU RN and Post -op Vital signs reviewed and stable  Post vital signs:  Reviewed and stable  Last Vitals:  Vitals:   07/19/24 0727 07/19/24 0824  BP: 128/84 (!) 146/64  Pulse: 89 89  Resp: 17 18  Temp: 36.7 C (!) 36.3 C  SpO2: 94% 100%    Complications: No apparent anesthesia complications

## 2024-07-19 NOTE — Brief Op Note (Signed)
 07/19/2024  7:30 AM  8:35 AM  PATIENT:  Devere MARLA Edu  79 y.o. female  PRE-OPERATIVE DIAGNOSIS:  Esophageal Obstruction; Dysphagia  POST-OPERATIVE DIAGNOSIS:  esophageal stricture- balloon dilation, retained food in esophagus, esophagitis, gastritis  PROCEDURE:  Procedures: EGD (ESOPHAGOGASTRODUODENOSCOPY) (N/A) REMOVAL, FOREIGN BODY Balloon dilation wire-guided  SURGEON:  Surgeons and Role:    * Jceon Alverio, MD - Primary  Findings -------------- - EGD showed severe stenosis mid esophagus at 30 cm.  Likely from extrinsic compression.  Adult endoscope could not be passed.  Subsequently pediatric endoscope was passed and she was found to have no significant evidence of inflammation at this level.  She also had a lower esophageal ring nonobstructive.  Gastritis.  Dilation with 10 to 11 mm balloon was performed.  Even after dilation I could not advance regular upper endoscope and subsequently peds endoscope was used to evaluate post dilation site which appeared normal.  Recommendations ---------------------------- - Recommend full liquid diet for now - Recommend barium swallow without barium pill rule out extrinsic compression.  If this is extrinsic compression, dilating esophageal lumen may not improve her dysphagia. - GI will follow  Layla Lah MD, FACP 07/19/2024, 8:37 AM  Contact #  [phone]

## 2024-07-20 ENCOUNTER — Encounter (HOSPITAL_COMMUNITY): Payer: Self-pay | Admitting: Gastroenterology

## 2024-07-20 DIAGNOSIS — K222 Esophageal obstruction: Secondary | ICD-10-CM | POA: Diagnosis not present

## 2024-07-20 LAB — BASIC METABOLIC PANEL WITH GFR
Anion gap: 7 (ref 5–15)
BUN: <5 mg/dL — ABNORMAL LOW (ref 8–23)
CO2: 29 mmol/L (ref 22–32)
Calcium: 9.9 mg/dL (ref 8.9–10.3)
Chloride: 104 mmol/L (ref 98–111)
Creatinine, Ser: 0.56 mg/dL (ref 0.44–1.00)
GFR, Estimated: >60 mL/min
Glucose, Bld: 94 mg/dL (ref 70–99)
Potassium: 4.6 mmol/L (ref 3.5–5.1)
Sodium: 140 mmol/L (ref 135–145)

## 2024-07-20 LAB — CBC
HCT: 39.8 % (ref 36.0–46.0)
Hemoglobin: 13 g/dL (ref 12.0–15.0)
MCH: 30.5 pg (ref 26.0–34.0)
MCHC: 32.7 g/dL (ref 30.0–36.0)
MCV: 93.4 fL (ref 80.0–100.0)
Platelets: 111 K/uL — ABNORMAL LOW (ref 150–400)
RBC: 4.26 MIL/uL (ref 3.87–5.11)
RDW: 13.7 % (ref 11.5–15.5)
WBC: 3.5 K/uL — ABNORMAL LOW (ref 4.0–10.5)
nRBC: 0 % (ref 0.0–0.2)

## 2024-07-20 LAB — PHOSPHORUS: Phosphorus: 3.3 mg/dL (ref 2.5–4.6)

## 2024-07-20 MED ORDER — CHLORHEXIDINE GLUCONATE CLOTH 2 % EX PADS
6.0000 | MEDICATED_PAD | Freq: Every day | CUTANEOUS | Status: DC
  Administered 2024-07-21 – 2024-07-25 (×5): 6 via TOPICAL

## 2024-07-20 MED ORDER — ZONISAMIDE 100 MG PO CAPS
100.0000 mg | ORAL_CAPSULE | Freq: Every day | ORAL | Status: DC
  Administered 2024-07-20 – 2024-07-21 (×2): 100 mg via ORAL
  Filled 2024-07-20 (×2): qty 1

## 2024-07-20 MED ORDER — ZONISAMIDE 25 MG PO CAPS
50.0000 mg | ORAL_CAPSULE | Freq: Every day | ORAL | Status: DC
  Administered 2024-07-20 – 2024-07-21 (×2): 50 mg via ORAL
  Filled 2024-07-20 (×3): qty 2

## 2024-07-20 MED ORDER — MORPHINE SULFATE (PF) 2 MG/ML IV SOLN
1.0000 mg | INTRAVENOUS | Status: DC | PRN
  Administered 2024-07-22 (×2): 1 mg via INTRAVENOUS
  Filled 2024-07-20 (×2): qty 1

## 2024-07-20 MED ORDER — HYDROCODONE-ACETAMINOPHEN 5-325 MG PO TABS: 1.0000 | ORAL_TABLET | Freq: Four times a day (QID) | ORAL | Status: DC | PRN

## 2024-07-20 MED ORDER — ZONISAMIDE 50 MG PO CAPS: 50.0000 mg | ORAL_CAPSULE | Freq: Two times a day (BID) | ORAL | Status: DC

## 2024-07-20 MED ORDER — SODIUM CHLORIDE 0.9% FLUSH: 10.0000 mL | INTRAVENOUS | Status: DC | PRN

## 2024-07-20 MED ORDER — HYDROCODONE-ACETAMINOPHEN 5-325 MG PO TABS: 1.0000 | ORAL_TABLET | ORAL | Status: DC | PRN

## 2024-07-20 MED ORDER — HYDROCODONE-ACETAMINOPHEN 10-325 MG PO TABS
1.0000 | ORAL_TABLET | Freq: Four times a day (QID) | ORAL | Status: DC | PRN
  Administered 2024-07-20 – 2024-07-22 (×6): 1 via ORAL
  Filled 2024-07-20 (×6): qty 1

## 2024-07-20 MED ORDER — SIMVASTATIN 20 MG PO TABS
40.0000 mg | ORAL_TABLET | Freq: Every day | ORAL | Status: DC
  Administered 2024-07-20 – 2024-07-21 (×2): 40 mg via ORAL
  Filled 2024-07-20 (×2): qty 2

## 2024-07-20 NOTE — Plan of Care (Signed)
" °  Problem: Education: Goal: Knowledge of General Education information will improve Description: Including pain rating scale, medication(s)/side effects and non-pharmacologic comfort measures Outcome: Progressing   Problem: Health Behavior/Discharge Planning: Goal: Ability to manage health-related needs will improve Outcome: Progressing   Problem: Clinical Measurements: Goal: Respiratory complications will improve Outcome: Progressing Goal: Cardiovascular complication will be avoided Outcome: Progressing   Problem: Activity: Goal: Risk for activity intolerance will decrease Outcome: Progressing   Problem: Coping: Goal: Level of anxiety will decrease Outcome: Progressing   Problem: Elimination: Goal: Will not experience complications related to bowel motility Outcome: Progressing Goal: Will not experience complications related to urinary retention Outcome: Progressing   "

## 2024-07-20 NOTE — Progress Notes (Signed)
" °  Progress Note   Patient: Rachel Bates FMW:[id number] DOB: Jul 08, 1946 DOA: 07/17/2024     3 DOS: the patient was seen and examined on 07/20/2024 at 9:40 AM      Brief hospital course: 79 y.o. 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  GI consulted, underwent EGD with dilation.  Barium esophagram pending      Assessment and Plan:  Esophageal stenosis Dysphagia Had had some esophageal dysphagia a year ago around time of her lung cancer diagnosis, but in the last month, developed progressive dysphagia to solids, lost 20 pounds in 5 weeks.  Finally went to her oncologist who sent her to the ER.  CT in the ER showed fluid-filled esophagus, debris, air-fluid level.  Admitted for GI consultation  Underwent EGD on 1/10 with dilation. 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 Follows with Dr. Onesimo. Of concern, CT on admision showed new nodularity in lungs and new hypodensities in the liver concerning for metastases.  Chronic systolic congestive heart failure Coronary artery disease Hypertension Blood pressure controlled - Continue carvedilol , ramipril  - Continue simvastatin   Paroxysmal atrial fibrillation -Continue Coreg  - Continue apixaban   History of venous thromboembolism See above  History of non-Hodgkin lymphoma Pleated treatment in 2024, R-CEOP, follows with Dr. Onesimo  Leukopenia Thrombocytopenia Cancer related, stable.         Subjective: Patient has no new concerns.  She has had no vomiting, fever, respiratory symptoms.  EGD yesterday, barium swallow pending for tomorrow.     Physical Exam: BP 138/62 (BP Location: Right Arm)   Pulse 81    Temp 98.1 F (36.7 C) (Oral)   Resp 20   Ht 5' 7 (1.702 m)   Wt 57.3 kg   SpO2 95%   BMI 19.78 kg/m   Elderly adult female, lying in bed, interactive and appropriate RRR, no murmurs, no peripheral edema Respiratory normal, lungs clear without rales or wheezes Abdomen soft no tenderness palpation or guarding, no ascites or distention Oriented x 3, moves upper extremities and normal strength and coordination, face symmetric, speech fluent   Data Reviewed: Discussed with GI Basic metabolic panel shows normal electrolytes and renal function CBC shows mild leukopenia and thrombocytopenia, both stable, no anemia    Family Communication:     Disposition: Status is: Inpatient         Author: Lonni SHAUNNA Dalton, MD 07/20/2024 12:04 PM  For on call review [email or web address]    "

## 2024-07-20 NOTE — Plan of Care (Signed)

## 2024-07-20 NOTE — Hospital Course (Signed)
 79 y.o. 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  GI consulted, underwent EGD with dilation.  Barium esophagram pending

## 2024-07-20 NOTE — Progress Notes (Signed)
 [facility] Gastroenterology Progress Note  Rachel Bates 79 y.o. 01-Oct-1945  CC: Dysphagia, esophageal stenosis   Subjective: -Patient seen and examined at bedside.  Tolerating liquid diet.  See EGD report from yesterday.  ROS : Afebrile, negative for chest pain   Objective: Vital signs in last 24 hours: Vitals:   07/19/24 2046 07/20/24 0517  BP: (!) 120/57 138/62  Pulse: 81 81  Resp: 20 20  Temp: 98.7 F (37.1 C) 98.1 F (36.7 C)  SpO2: 94% 95%    Physical Exam: Elderly patient, resting comfortably.  Not in acute distress.  Abdominal exam benign.  Mood and affect normal.  Alert and oriented x 3    Lab Results: Recent Labs    07/17/24 1430 07/18/24 0527 07/19/24 1035 07/20/24 0658  NA 137   < > 139 140  K 3.6   < > 4.0 4.6  CL 101   < > 104 104  CO2 26   < > 27 29  GLUCOSE 113*   < > 92 94  BUN 8   < > 6* <5*  CREATININE 0.53   < > 0.55 0.56  CALCIUM  9.8   < > 9.3 9.9  MG 2.1  --   --   --   PHOS 3.2  --  3.2 3.3   < > = values in this interval not displayed.   Recent Labs    07/17/24 1430  AST 24  ALT 15  ALKPHOS 96  BILITOT 1.1  PROT 6.8  ALBUMIN  4.1   Recent Labs    07/17/24 1430 07/18/24 0527 07/19/24 1035 07/20/24 0658  WBC 4.5   < > 3.8* 3.5*  NEUTROABS 3.6  --   --   --   HGB 15.2*   < > 12.9 13.0  HCT 43.7   < > 39.1 39.8  MCV 89.0   < > 93.1 93.4  PLT 148*   < > 106* 111*   < > = values in this interval not displayed.   Recent Labs    07/18/24 0527  LABPROT 14.8  INR 1.1      Assessment/Plan: -Dysphagia with history of esophageal stricture.  EGD yesterday showed stenosis likely from extrinsic compression in the mid esophagus around 30 cm.  Not able to advance regular endoscope and peds endoscope used and subsequently dilation was done up to 11 mm and I was still not able to advance regular endoscope.  Also has Schatzki's ring.  Food bolus was removed with Rachel Bates net. - Metastatic squamous cell lung cancer - History of  non-Hodgkin's lymphoma - History of atrial fibrillation and history of pulmonary embolism  Recommendations -------------------------- - Plan for barium swallow tomorrow without fail to evaluate for any extrinsic compression in the mid esophagus.  If there is an existing compression, endoscopic dilation will not help with the dysphagia. - Okay to resume Eliquis  from GI standpoint - GI will follow   Layla Lah MD, FACP 07/20/2024, 9:56 AM  Contact #  [phone]

## 2024-07-21 ENCOUNTER — Inpatient Hospital Stay (HOSPITAL_COMMUNITY)

## 2024-07-21 DIAGNOSIS — K222 Esophageal obstruction: Secondary | ICD-10-CM | POA: Diagnosis not present

## 2024-07-21 LAB — CBC
HCT: 39 % (ref 36.0–46.0)
Hemoglobin: 12.8 g/dL (ref 12.0–15.0)
MCH: 31.1 pg (ref 26.0–34.0)
MCHC: 32.8 g/dL (ref 30.0–36.0)
MCV: 94.7 fL (ref 80.0–100.0)
Platelets: 116 K/uL — ABNORMAL LOW (ref 150–400)
RBC: 4.12 MIL/uL (ref 3.87–5.11)
RDW: 13.6 % (ref 11.5–15.5)
WBC: 3.4 K/uL — ABNORMAL LOW (ref 4.0–10.5)
nRBC: 0 % (ref 0.0–0.2)

## 2024-07-21 LAB — BASIC METABOLIC PANEL WITH GFR
Anion gap: 7 (ref 5–15)
BUN: 5 mg/dL — ABNORMAL LOW (ref 8–23)
CO2: 28 mmol/L (ref 22–32)
Calcium: 9.6 mg/dL (ref 8.9–10.3)
Chloride: 105 mmol/L (ref 98–111)
Creatinine, Ser: 0.48 mg/dL (ref 0.44–1.00)
GFR, Estimated: >60 mL/min
Glucose, Bld: 104 mg/dL — ABNORMAL HIGH (ref 70–99)
Potassium: 4.1 mmol/L (ref 3.5–5.1)
Sodium: 140 mmol/L (ref 135–145)

## 2024-07-21 NOTE — Progress Notes (Addendum)
 Subjective: Tolerating liquids. Reports not having bowel movement for several days.  Objective: Vital signs in last 24 hours: Temp:  [98.1 F (36.7 C)-98.5 F (36.9 C)] 98.1 F (36.7 C) (01/12 0431) Pulse Rate:  [72-85] 85 (01/12 0819) Resp:  [16-20] 16 (01/12 0431) BP: (111-129)/(53-77) 129/77 (01/12 0819) SpO2:  [93 %-95 %] 95 % (01/12 0431) Weight change:  Last BM Date : 07/19/24  PE: Elderly GENERAL: Anicteric, no pallor  ABDOMEN: Soft ,nondistended, nontender EXTREMITIES: No deformity  Lab Results: Results for orders placed or performed during the hospital encounter of 07/17/24 (from the past 48 hours)  CBC     Status: Abnormal   Collection Time: 07/20/24  6:58 AM  Result Value Ref Range   WBC 3.5 (L) 4.0 - 10.5 K/uL   RBC 4.26 3.87 - 5.11 MIL/uL   Hemoglobin 13.0 12.0 - 15.0 g/dL   HCT 60.1 63.9 - 53.9 %   MCV 93.4 80.0 - 100.0 fL   MCH 30.5 26.0 - 34.0 pg   MCHC 32.7 30.0 - 36.0 g/dL   RDW 86.2 88.4 - 84.4 %   Platelets 111 (L) 150 - 400 K/uL   nRBC 0.0 0.0 - 0.2 %    Comment: Performed at [facility], [address], [place], [place] [address]  Basic metabolic panel with GFR     Status: Abnormal   Collection Time: 07/20/24  6:58 AM  Result Value Ref Range   Sodium 140 135 - 145 mmol/L   Potassium 4.6 3.5 - 5.1 mmol/L   Chloride 104 98 - 111 mmol/L   CO2 29 22 - 32 mmol/L   Glucose, Bld 94 70 - 99 mg/dL    Comment: Glucose reference range applies only to samples taken after fasting for at least 8 hours.   BUN <5 (L) 8 - 23 mg/dL   Creatinine, Ser 9.43 0.44 - 1.00 mg/dL   Calcium  9.9 8.9 - 10.3 mg/dL   GFR, Estimated >39 >39 mL/min    Comment: (NOTE) Calculated using the CKD-EPI Creatinine Equation (2021)    Anion gap 7 5 - 15    Comment: Performed at [facility], [address] [place], [place] [address]  Phosphorus     Status: None   Collection Time: 07/20/24  6:58 AM  Result Value Ref Range   Phosphorus 3.3  2.5 - 4.6 mg/dL    Comment: Performed at [facility], [address], [place], [place] [address]  Basic metabolic panel with GFR     Status: Abnormal   Collection Time: 07/21/24  4:45 AM  Result Value Ref Range   Sodium 140 135 - 145 mmol/L   Potassium 4.1 3.5 - 5.1 mmol/L   Chloride 105 98 - 111 mmol/L   CO2 28 22 - 32 mmol/L   Glucose, Bld 104 (H) 70 - 99 mg/dL    Comment: Glucose reference range applies only to samples taken after fasting for at least 8 hours.   BUN 5 (L) 8 - 23 mg/dL   Creatinine, Ser 9.51 0.44 - 1.00 mg/dL   Calcium  9.6 8.9 - 10.3 mg/dL   GFR, Estimated >39 >39 mL/min    Comment: (NOTE) Calculated using the CKD-EPI Creatinine Equation (2021)    Anion gap 7 5 - 15    Comment: Performed at [facility], [address] [place], [place] [address]  CBC     Status: Abnormal   Collection Time: 07/21/24  4:45 AM  Result Value Ref Range   WBC 3.4 (L) 4.0 - 10.5 K/uL   RBC 4.12 3.87 - 5.11 MIL/uL   Hemoglobin 12.8 12.0 - 15.0 g/dL   HCT 60.9 63.9 - 53.9 %   MCV 94.7 80.0 - 100.0 fL   MCH 31.1 26.0 - 34.0 pg   MCHC 32.8 30.0 - 36.0 g/dL   RDW 86.3 88.4 - 84.4 %   Platelets 116 (L) 150 - 400 K/uL   nRBC 0.0 0.0 - 0.2 %    Comment: Performed at [facility], [address] [place], [place] [address]    Studies/Results: No results found.  Medications: I have reviewed the patient's current medications.  Assessment: Dysphagia EGD 07/19/2024: Stenosis at 30 cm likely from extrinsic compression, device nonobstructing's Schatzki's ring  Metastatic squamous cell lung cancer History of diffuse large B-cell lymphoma Atrial fibrillation and pulmonary embolism  CT: Right hilar mass, right supraclavicular, mediastinal lymphadenopathy, multiple metastatic disease in liver, soft tissue involving anterior seventh rib compatible with bony metastases  Plan: Barium swallow : Abrupt narrowing in mid esophagus likely  secondary to left-sided paraesophageal mass based on CAT scan 2.4 x 2.3 cm(history of lymphoma and squamous cell carcinoma of right lung, likely represents metastatic disease or lymphomatous involvement)  Has significant weight loss of about 25 pounds in the last several months, with progressively worsening solid food dysphagia.  Discussed with patient regarding IR guided feeding tube placement to aid with nutrition as patient has significant weight loss and is not able to tolerate solid foods due to dysphagia which appears to be related to extrinsic compression.  She wants to talk to her daughter about it but is agreeable to feeding tube placement.  Please recall GI if needed, otherwise I will sign off.  Estelita Manas, MD 07/21/2024, 10:37 AM

## 2024-07-21 NOTE — Progress Notes (Signed)
" °  Progress Note   Patient: Rachel Bates FMW:[id number] DOB: July 23, 1945 DOA: 07/17/2024     4 DOS: the patient was seen and examined on 07/21/2024        Brief hospital course: 79 y.o. 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  GI consulted, underwent EGD with dilation.  Barium esophagram pending      Assessment and Plan: Malignant esophageal stenosis See summary from prior.  Esophagram today showed persistent narrowing at the same level as the paraesophageal mass.  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 Onesimo   Leukopenia Thrombocytopenia Cancer related, stable.             Subjective: Patient is feeling okay, no new nursing concerns      Physical Exam: BP (!) 115/55 (BP Location: Left Arm)   Pulse 72   Temp 97.9 F (36.6 C) (Oral)   Resp 16   Ht 5' 7 (1.702 m)   Wt 57.3 kg   SpO2 93%   BMI 19.78 kg/m   Elderly adult female, sitting in bed, appears comfortable RRR, no murmurs, no peripheral edema Respiratory rate normal, lungs clear without rales or wheezes Abdomen soft, no tenderness palpation or guarding, no ascites or distention   Data Reviewed: Discussed with GI and oncology Esophagram report reviewed    Family Communication:     Disposition: Status is:  Inpatient         Author: Lonni SHAUNNA Dalton, MD 07/21/2024 4:22 PM  For on call review [email or web address]    "

## 2024-07-21 NOTE — Plan of Care (Signed)
  Problem: Health Behavior/Discharge Planning: Goal: Ability to manage health-related needs will improve Outcome: Progressing   Problem: Clinical Measurements: Goal: Will remain free from infection Outcome: Progressing Goal: Cardiovascular complication will be avoided Outcome: Progressing   Problem: Nutrition: Goal: Adequate nutrition will be maintained Outcome: Progressing   Problem: Safety: Goal: Ability to remain free from injury will improve Outcome: Progressing   

## 2024-07-21 NOTE — Progress Notes (Signed)
" °   07/21/24 1530  Spiritual Encounters  Type of Visit Initial  Care provided to: Pt and family  Referral source Nurse (RN/NT/LPN)  Reason for visit Advance directives  OnCall Visit No   I responded to a spiritual care consult request by [place], Zoe E, RN on behalf of [name].  Ms Rachel Bates welcomed my visit. Her daughter was at bedside.  I offered assistance regarding completing or modifying HCPOA and noted that this chaplain was aware of a Living Will scanned into patient's chart. At [name] request, I printed a copy of that version for her to review. I also described other ways to specify GOC and clarified distinctions (among code status, MOST, and living will). I also provided a blank HCPOA with brief education on that document, comparing with existing paperwork. I let them know how to ask for a chaplain through their care team for ongoing support if changes needed or and further questions.  Rachel Bates L. Delores HERO.Div "

## 2024-07-21 NOTE — Progress Notes (Addendum)
 Patient ID: Rachel Bates, female   DOB: 09/08/1945, 79 y.o.   MRN: [id number] Request received for G tube placement in pt. Latest imaging studies were reviewed by Dr. Hughes. Pt will need updated CT abd to evaluate anatomy prior to scheduling tube placement.  In the meantime need to hold eliquis  (4 doses must be held prior to tube placement)- last dose this am. Above d/w pt/daughter.

## 2024-07-22 DIAGNOSIS — C3411 Malignant neoplasm of upper lobe, right bronchus or lung: Secondary | ICD-10-CM

## 2024-07-22 DIAGNOSIS — R1319 Other dysphagia: Secondary | ICD-10-CM | POA: Diagnosis not present

## 2024-07-22 DIAGNOSIS — E43 Unspecified severe protein-calorie malnutrition: Secondary | ICD-10-CM | POA: Insufficient documentation

## 2024-07-22 DIAGNOSIS — K222 Esophageal obstruction: Secondary | ICD-10-CM | POA: Diagnosis not present

## 2024-07-22 MED ORDER — TRAZODONE HCL 50 MG PO TABS
50.0000 mg | ORAL_TABLET | Freq: Every day | ORAL | Status: DC
  Filled 2024-07-22 (×2): qty 1

## 2024-07-22 MED ORDER — CEFAZOLIN SODIUM-DEXTROSE 2-4 GM/100ML-% IV SOLN: 2.0000 g | INTRAVENOUS | Status: DC

## 2024-07-22 MED ORDER — MORPHINE SULFATE (CONCENTRATE) 10 MG /0.5 ML PO SOLN: 5.0000 mg | ORAL | Status: DC | PRN

## 2024-07-22 MED ORDER — MORPHINE SULFATE (CONCENTRATE) 10 MG /0.5 ML PO SOLN
10.0000 mg | ORAL | Status: DC | PRN
  Administered 2024-07-25: 10 mg via ORAL
  Filled 2024-07-22: qty 0.5

## 2024-07-22 MED ORDER — MORPHINE SULFATE (PF) 2 MG/ML IV SOLN
2.0000 mg | INTRAVENOUS | Status: DC | PRN
  Administered 2024-07-22 – 2024-07-24 (×4): 2 mg via INTRAVENOUS
  Filled 2024-07-22 (×5): qty 1

## 2024-07-22 MED ORDER — LIDOCAINE 5 % EX PTCH
1.0000 | MEDICATED_PATCH | Freq: Every day | CUTANEOUS | Status: DC
  Administered 2024-07-22 – 2024-07-24 (×2): 1 via TRANSDERMAL
  Filled 2024-07-22 (×4): qty 1

## 2024-07-22 MED ORDER — LABETALOL HCL 5 MG/ML IV SOLN: 10.0000 mg | Freq: Four times a day (QID) | INTRAVENOUS | Status: DC | PRN

## 2024-07-22 MED ORDER — KCL IN DEXTROSE-NACL 20-5-0.9 MEQ/L-%-% IV SOLN
INTRAVENOUS | Status: AC
  Filled 2024-07-22 (×3): qty 1000

## 2024-07-22 MED ORDER — DIPHENHYDRAMINE HCL 50 MG/ML IJ SOLN
25.0000 mg | Freq: Every evening | INTRAMUSCULAR | Status: DC | PRN
  Administered 2024-07-22: 25 mg via INTRAVENOUS
  Filled 2024-07-22: qty 1

## 2024-07-22 MED ORDER — ORAL CARE MOUTH RINSE: 15.0000 mL | OROMUCOSAL | Status: DC | PRN

## 2024-07-22 MED ORDER — HEPARIN SOD (PORK) LOCK FLUSH 100 UNIT/ML IV SOLN: 500.0000 [IU] | INTRAVENOUS | Status: DC | PRN

## 2024-07-22 NOTE — Progress Notes (Cosign Needed)
 Rachel Bates   DOB:1945-12-31   FM#:[id number]      CLINICAL SUMMARY:  Rachel Bates is a 79 year old female patient admitted on 07/17/2024 with complaints of difficulty swallowing with weight loss.  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  Medical oncology following.    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  Squamous cell carcinoma of RUL lung, stage III - Diagnosed 08/07/2023 - Status post concurrent chemoradiation with carbo + Taxol  from 09/18/2023 to 10/16/2023. - Status post radiation therapy - Started on immunotherapy with Durvalumab  09/18/2023, this has been discontinued. - Plan to proceed with dual immune checkpoint inhibitor therapy Ipi/Nivolumab, scheduled originally for this week.  However may postpone due to hospitalization. - Imaging 02/13/2024 showed decrease in size of RUL mass.  However interval increase in size of nodules to our right apex.  Interval enlargement of lymph nodes consistent with worsening nodal disease. - Medical oncology/Dr. Onesimo will make further recommendations.  History of diffuse large B-cell lymphoma, at least stage IIIa -Diagnosed 10/04/2022. - Status post 6 cycles of chemotherapy regimen R-CEOP, completed 02/17/2023. -No intervention at this time  Thrombocytopenia, chronic Leukopenia - Mild.  - Platelets 116K.  WBC 3.4 - No intervention required at this time - Continue to monitor closely   Code Status Full  Subjective:  Patient seen awake and alert laying in bed.  Patient's daughter also at bedside.  She denies pain.  Admits to difficulty swallowing, states that she feels like something is in there.  Wants to proceed with G-tube placement.  No other acute  complaints offered.  Objective:   Intake/Output Summary (Last 24 hours) at 07/22/2024 1125 Last data filed at 07/22/2024 0600 Gross per 24 hour  Intake 580 ml  Output 0 ml  Net 580 ml     PHYSICAL EXAMINATION: ECOG PERFORMANCE STATUS: 3 - Symptomatic, >50% confined to bed  Vitals:   07/21/24 2138 07/22/24 0616  BP: (!) 107/55 (!) 108/53  Pulse: 78 77  Resp: 16 16  Temp: 99 F (37.2 C) 97.9 F (36.6 C)  SpO2: 93% 96%   Filed Weights   07/17/24 1621 07/18/24 1540  Weight: 128 lb 6.4 oz (58.2 kg) 126 lb 4.8 oz (57.3 kg)    GENERAL: alert, no distress and comfortable + chronically ill-appearing SKIN: + Pale skin color, texture, turgor are normal, no rashes or significant lesions EYES: normal, conjunctiva are pink and non-injected, sclera clear OROPHARYNX: no exudate, no erythema and lips, buccal mucosa, and tongue normal  NECK: supple, thyroid  normal size, non-tender, without nodularity LYMPH: no palpable lymphadenopathy in the cervical, axillary or inguinal LUNGS: clear to auscultation and percussion with normal breathing effort HEART: regular rate & rhythm and no murmurs and no lower extremity edema ABDOMEN: abdomen soft, non-tender and normal bowel sounds MUSCULOSKELETAL: no cyanosis of digits and no clubbing  PSYCH: alert & oriented x 3 with fluent speech NEURO: no focal motor/sensory deficits   All questions were answered. The patient knows to call the clinic with any problems, questions or concerns.   I personally spent a total of 40 minutes minutes in the care of the patient today including preparing to see the patient, performing a medically appropriate exam/evaluation, placing orders, referring and communicating with other health care professionals,  documenting clinical information in the EHR, communicating results, and coordinating care.    Olam PARAS Kerrie Latour, NP 07/22/2024 11:25 AM    Labs Reviewed:  Lab Results  Component Value Date   WBC 3.4 (L) 07/21/2024    HGB 12.8 07/21/2024   HCT 39.0 07/21/2024   MCV 94.7 07/21/2024   PLT 116 (L) 07/21/2024   Recent Labs    05/21/24 1027 06/04/24 1143 07/17/24 1430 07/18/24 0527 07/19/24 1035 07/20/24 0658 07/21/24 0445  NA 139 138 137   < > 139 140 140  K 4.0 4.0 3.6   < > 4.0 4.6 4.1  CL 106 103 101   < > 104 104 105  CO2 27 28 26    < > 27 29 28   GLUCOSE 94 101* 113*   < > 92 94 104*  BUN 13 15 8    < > 6* <5* 5*  CREATININE 0.57 0.62 0.53   < > 0.55 0.56 0.48  CALCIUM  9.8 9.7 9.8   < > 9.3 9.9 9.6  GFRNONAA >60 >60 >60   < > >60 >60 >60  PROT 6.7 6.9 6.8  --   --   --   --   ALBUMIN  4.0 4.2 4.1  --   --   --   --   AST 14* 23 24  --   --   --   --   ALT 11 17 15   --   --   --   --   ALKPHOS 87 96 96  --   --   --   --   BILITOT 0.4 0.4 1.1  --   --   --   --    < > = values in this interval not displayed.    Studies Reviewed:   CT ABDOMEN WO CONTRAST Result Date: 07/22/2024 CLINICAL DATA:  assess anatomy for gastrostomy tube placement Pre gastric tube placement. Barium swallow : Abrupt narrowing in mid esophagus likely secondary to left-sided paraesophageal mass based on CAT scan 2.4 x 2.3 cm(history of lymphoma and squamous cell carcinoma of right lung EXAM: CT ABDOMEN WITHOUT CONTRAST TECHNIQUE: Multidetector CT imaging of the abdomen was performed following the standard protocol without IV contrast. RADIATION DOSE REDUCTION: This exam was performed according to the departmental dose-optimization program which includes automated exposure control, adjustment of the mA and/or kV according to patient size and/or use of iterative reconstruction technique. COMPARISON:  CTA chest and CT AP, 07/17/2024. FINDINGS: Lower chest: No acute abnormality. Anterior mediastinal mass and bibasilar pulmonary nodularities. Hepatobiliary: Multifocal hepatic hypodensities. No gallstones, gallbladder wall thickening, or biliary dilatation. Pancreas: No pancreatic ductal dilatation or surrounding inflammatory  changes. Spleen: Normal in size without focal abnormality. Adrenals/Urinary Tract: Adrenal glands are unremarkable. Kidneys are normal, without renal calculi, focal lesion, or hydronephrosis. Stomach/Bowel: Intraluminal opacification of stomach and imaged small bowel, likely previously ingested. Stomach is within normal limits. Appendix appears normal. No evidence of bowel wall thickening, distention, or inflammatory changes. Vascular/Lymphatic: Aortic atherosclerosis. No enlarged abdominal lymph nodes. Other: Mild scaphoid abdomen. No abdominal wall hernia or abnormality. Musculoskeletal: RIGHT seventh rib expansile lesion. No interval osseous abnormality. IMPRESSION: 1. Mild prominence of the LEFT hepatic lobe. Anatomy is otherwise amenable for percutaneous gastrostomy. 2. No acute abdominal findings 3. Multiple findings within the imaged chest and abdomen, better appreciated and described on recent comparison contrasted CT CAP, consistent with metastatic disease. Electronically Signed   By: Thom Hall M.D.   On: 07/22/2024 07:03  DG ESOPHAGUS W SINGLE CM (SOL OR THIN BA) Result Date: 07/21/2024 CLINICAL DATA:  Dysphagia, history of esophageal stricture. EGD completed yesterday, unable to advance endoscope past 30 cm distally in esophagus. GI suspecting extrinsic esophageal compression. Request for esophagram for further evaluation. EXAM: ESOPHAGUS/BARIUM SWALLOW/TABLET STUDY TECHNIQUE: Single contrast examination was performed using thin liquid barium. This exam was performed by Kimble Clas, PA-C, and was supervised and interpreted by Dr. MARLA Kilts. FLUOROSCOPY: Radiation Exposure Index (as provided by the fluoroscopic device): 13.70 mGy Kerma COMPARISON:  None Available. FINDINGS: Exam limited secondary to poor PO intake ability. Esophagus: Marked area of esophageal narrowing in the midthoracic esophagus with slowing of barium passage. Appears to be in similar location to area described in recent EGD,  roughly 30 cm distal. Esophageal motility: Significant esophageal dysmotility with lying prone. Mild dysmotility with standing upright. Gastroesophageal reflux: None visualized. Other: None. IMPRESSION: Limited exam secondary to patient unable to drink consecutive swallows. Abrupt narrowing of the mid esophageal contrast column with partial obstruction. When correlated with the CT of 07/17/2024, this is likely secondary to a left-sided paraesophageal mass (most likely nodal) including on image 82 of series 6. This mass is estimated at 2.4 x 2.3 cm. Given EMR history of lymphoma and squamous cell carcinoma of the right lung, this presumably represents metastatic disease or lymphomatous involvement. Esophageal dysmotility, likely presbyesophagus. Electronically Signed   By: Rockey Kilts M.D.   On: 07/21/2024 11:03   CT ABDOMEN PELVIS W CONTRAST Result Date: 07/17/2024 EXAM: CTA CHEST PE WITH AND WITHOUT CONTRAST CT ABDOMEN AND PELVIS WITH CONTRAST 07/17/2024 06:06:12 PM TECHNIQUE: CTA of the chest was performed after the administration of 100 mL of iohexol  (OMNIPAQUE ) 350 MG/ML injection. Multiplanar reformatted images are provided for review. MIP images are provided for review. CT of the abdomen and pelvis was performed with the administration of intravenous contrast. Automated exposure control, iterative reconstruction, and/or weight based adjustment of the mA/kV was utilized to reduce the radiation dose to as low as reasonably achievable. COMPARISON: CT chest abdomen and pelvis 05/27/2024. CLINICAL HISTORY: Abdominal pain, nausea, vomiting, cancer, weight loss. Pulmonary embolus suspected with high probability. Feels like something in the chest. Difficulty swallowing. FINDINGS: CHEST: PULMONARY ARTERIES: Pulmonary arteries are adequately opacified for evaluation. No intraluminal filling defect to suggest pulmonary embolism. Main pulmonary artery is normal in caliber. MEDIASTINUM: Right central venous catheter  with tip in the SVC. Normal heart size. No pericardial effusions. Air-fluid level and debris in the esophagus with esophageal dilatation down to the level of about T8 with distal decompression of the esophagus. This is concerning for esophageal obstruction, possibly due to mass, stricture, or extrinsic compression. Right supraclavicular lymphadenopathy, today measuring 2.6 cm in diameter. Right paratracheal lymphadenopathy measuring up to 1.2 cm short axis. Developing right hilar mass or lymphadenopathy measuring 2.4 cm diameter. There is no acute abnormality of the thoracic aorta. LUNGS AND PLEURA: Consolidation in the right middle lung likely representing postobstructive change. Pneumonia would be a less likely consideration. Multiple pulmonary nodules are demonstrated throughout the lungs. Largest are in the right lung bases measuring up to 1.5 x 0.9 cm. Pulmonary nodularity is new since the prior study. 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 No pleural effusion or pneumothorax. SOFT TISSUES AND BONES: Degenerative changes in the spine. Expansile soft tissue lesion involving the right anterior 7th rib, enlarging since prior study, consistent with bone metastasis. ABDOMEN AND PELVIS: LIVER: Diffuse fatty  infiltration of the liver. Multiple heterogeneous hypoenhancing liver lesions consistent with metastatic disease. The largest is in segment 5 measuring 3 cm diameter. This is progressing since the previous study. GALLBLADDER AND BILE DUCTS: Gallbladder is unremarkable. No biliary ductal dilatation. SPLEEN: Spleen demonstrates no acute abnormality. PANCREAS: Pancreas demonstrates no acute abnormality. ADRENAL GLANDS: Adrenal glands demonstrate no acute abnormality. KIDNEYS, URETERS AND BLADDER: No stones in the kidneys or ureters. No hydronephrosis. No perinephric or periureteral stranding. The bladder is obscured by  streak artifact from hip arthroplasties. GI AND BOWEL: Stomach and duodenal sweep demonstrate no acute abnormality. There is no bowel obstruction. No abnormal bowel wall thickening or distension. Stool throughout the colon. The appendix is normal. REPRODUCTIVE: The uterus and ovaries are not enlarged. PERITONEUM AND RETROPERITONEUM: No ascites or free air. LYMPH NODES: No retroperitoneal lymphadenopathy. 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 Streak artifact from the arthroplasties limits visualization of the low pelvis. Degenerative changes in the lumbar spine with mild lumbar scoliosis convex towards the right. Calcification of the abdominal aorta. No focal soft tissue abnormality. IMPRESSION: 1. No pulmonary embolism. 2. Esophageal obstruction with air-fluid level and debris, possibly due to mass, stricture, or extrinsic compression. 3. Right supraclavicular, mediastinal, and right hilar lymphadenopathy. , concerning for malignancy. 4. Right hilar mass with right middle lobe consolidation, likely postobstructive change. 5. 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 6. Multiple heterogeneous hypoenhancing liver lesions consistent with metastatic disease, progressing since the previous study. 7. Expansile soft tissue lesion involving the right anterior 7th rib, enlarging since prior study, consistent with bone metastasis. Electronically signed by: Elsie Gravely MD 07/17/2024 06:24 PM EST RP Workstation: [id number]   CT Angio Chest PE W and/or Wo Contrast Result Date: 07/17/2024 EXAM: CTA CHEST PE WITH AND WITHOUT CONTRAST CT ABDOMEN AND PELVIS WITH CONTRAST 07/17/2024 06:06:12 PM TECHNIQUE: CTA of the chest was performed after the administration of 100 mL of iohexol  (OMNIPAQUE ) 350 MG/ML injection. Multiplanar reformatted images are provided for review. MIP images are provided for review. CT of the  abdomen and pelvis was performed with the administration of intravenous contrast. Automated exposure control, iterative reconstruction, and/or weight based adjustment of the mA/kV was utilized to reduce the radiation dose to as low as reasonably achievable. COMPARISON: CT chest abdomen and pelvis 05/27/2024. CLINICAL HISTORY: Abdominal pain, nausea, vomiting, cancer, weight loss. Pulmonary embolus suspected with high probability. Feels like something in the chest. Difficulty swallowing. FINDINGS: CHEST: PULMONARY ARTERIES: Pulmonary arteries are adequately opacified for evaluation. No intraluminal filling defect to suggest pulmonary embolism. Main pulmonary artery is normal in caliber. MEDIASTINUM: Right central venous catheter with tip in the SVC. Normal heart size. No pericardial effusions. Air-fluid level and debris in the esophagus with esophageal dilatation down to the level of about T8 with distal decompression of the esophagus. This is concerning for esophageal obstruction, possibly due to mass, stricture, or extrinsic compression. Right supraclavicular lymphadenopathy, today measuring 2.6 cm in diameter. Right paratracheal lymphadenopathy measuring up to 1.2 cm short axis. Developing right hilar mass or lymphadenopathy measuring 2.4 cm diameter. There is no acute abnormality of the thoracic aorta. LUNGS AND PLEURA: Consolidation in the right middle lung likely representing postobstructive change. Pneumonia would be a less likely consideration. Multiple pulmonary nodules are demonstrated throughout the lungs. Largest are in the right lung bases measuring up to 1.5 x 0.9 cm. Pulmonary nodularity is new since the prior study. While this could represent an infectious or  inflammatory process, the appearance is concerning for development of pulmonary metastasis. No pleural effusion or pneumothorax. SOFT TISSUES AND BONES: Degenerative changes in the spine. Expansile soft tissue lesion involving the right anterior  7th rib, enlarging since prior study, consistent with bone metastasis. ABDOMEN AND PELVIS: LIVER: Diffuse fatty infiltration of the liver. Multiple heterogeneous hypoenhancing liver lesions consistent with metastatic disease. The largest is in segment 5 measuring 3 cm diameter. This is progressing since the previous study. GALLBLADDER AND BILE DUCTS: Gallbladder is unremarkable. No biliary ductal dilatation. SPLEEN: Spleen demonstrates no acute abnormality. PANCREAS: Pancreas demonstrates no acute abnormality. ADRENAL GLANDS: Adrenal glands demonstrate no acute abnormality. KIDNEYS, URETERS AND BLADDER: No stones in the kidneys or ureters. No hydronephrosis. No perinephric or periureteral stranding. The bladder is obscured by streak artifact from hip arthroplasties. GI AND BOWEL: Stomach and duodenal sweep demonstrate no acute abnormality. There is no bowel obstruction. No abnormal bowel wall thickening or distension. Stool throughout the colon. The appendix is normal. REPRODUCTIVE: The uterus and ovaries are not enlarged. PERITONEUM AND RETROPERITONEUM: No ascites or free air. LYMPH NODES: No retroperitoneal lymphadenopathy. 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 Streak artifact from the arthroplasties limits visualization of the low pelvis. Degenerative changes in the lumbar spine with mild lumbar scoliosis convex towards the right. Calcification of the abdominal aorta. No focal soft tissue abnormality. IMPRESSION: 1. No pulmonary embolism. 2. Esophageal obstruction with air-fluid level and debris, possibly due to mass, stricture, or extrinsic compression. 3. Right supraclavicular, mediastinal, and right hilar lymphadenopathy. , concerning for malignancy. 4. Right hilar mass with right middle lobe consolidation, likely postobstructive change. 5. 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 6. Multiple  heterogeneous hypoenhancing liver lesions consistent with metastatic disease, progressing since the previous study. 7. Expansile soft tissue lesion involving the right anterior 7th rib, enlarging since prior study, consistent with bone metastasis. Electronically signed by: Elsie Gravely MD 07/17/2024 06:24 PM EST RP Workstation: [id number]   ADDENDUM  .Patient was Personally and independently interviewed, examined and relevant elements of the history of present illness were reviewed in details and an assessment and plan was created. All elements of the patient's history of present illness , assessment and plan were discussed in details with Anselm Aumiller NP. The above documentation reflects our combined findings assessment and plan.  I had a detailed GOC discussion and discussion regarding palliative treatment options with the patient with her daughter at bedside. 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 We discussed consideration of best supportive care through hospice vs consideration of additional palliative interventions.  -patient has decided that she would like to proceed with the G tube. -will switch her systemic rx to outpatient DOcetaxel with subsequent consideration of addiing Ramicirumab -nutritional optimization through G tube. -prn SL Morphine  for cancer pain mx and SL Lorazepam  for anxiety -asked GI if patient is a candidate for palliative esophageal stenting -- will be evaluating. -plz consult rad onc tomorrow for their input regarding palliative RT for esophageal obstruction.    Emaline Saran MD MS .The total time spent in the appointment was 50 minutes* .  All of the patient's questions were answered with apparent satisfaction. The patient knows to call the clinic with any problems, questions or concerns.   Emaline Saran MD MS AAHIVMS [facility]  [facility] Hematology/Oncology Physician [facility]  .*Total  Encounter Time as defined by the Centers for Medicare and Medicaid Services includes, in addition to the face-to-face time of a patient visit (documented in the note above) non-face-to-face time: obtaining and reviewing outside history, ordering and reviewing medications, tests or procedures, care coordination (communications with other health care professionals or caregivers) and documentation in the medical record.

## 2024-07-22 NOTE — Consult Note (Signed)
 "  Chief Complaint: Dysphagia, esophageal stenosis, lung cancer/lymphoma, malnutrition; referred for percutaneous gastrostomy tube placement  Referring Provider(s): Danford,C  Supervising Physician: Philip Cornet  Patient Status: [facility] - In-pt  History of Present Illness: Rachel Bates is a 79 y.o. 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  Request received from primary team for percutaneous gastrostomy tube placement.   Patient is Full Code  Past Medical History:  Diagnosis Date   AICD (automatic cardioverter/defibrillator) present dual   [employer]tronic - original placed 2007, generator change 2014, generator extraction 03/14/2024   Anemia    Anticoagulant long-term use    eliquis    Anxiety    Arthralgia of multiple joints    Arthritis pain    Benign hypertensive heart disease    CAD (coronary artery disease) primary cardiologist-- dr danelle taylor   MI and 2 stents 1995 in [place]    Cancer [facility])    Cataract ?   Surgery 2020   CHF NYHA class II, chronic, systolic (HCC)    followed by dr danelle taylor   Complication of anesthesia    Degenerative disc disease, lumbar    Depression    Difficult intubation    Dyslipidemia    History of basal cell carcinoma (BCC) excision    right ankle area s/p  excision in office 06/ 2019  in office   History of DVT of lower extremity yrs ago before 2012   History of MI (myocardial infarction) 1995  in [place]   History of pulmonary embolus (PE) 2012   History of ventricular tachycardia     Hyperlipidemia    Hypersomnia    Hypertension    Ischemic cardiomyopathy    Migraines    Myocardial infarction (HCC)    OA (osteoarthritis)    all over   PAF (paroxysmal atrial fibrillation) (HCC)    Pneumonia    02/2023   Primary localized osteoarthritis of right hip 12/18/2017   Primary localized osteoarthritis of right knee 05/28/2018   S/P coronary artery stent placement 1995   in [facility]   05-21-2018 per pt x2  stents in same coronary artery (unsure BM or DES)   Vitamin D  deficiency disease     Past Surgical History:  Procedure Laterality Date   BIOPSY  06/19/2022   Procedure: BIOPSY;  Surgeon: Elicia Claw, MD;  Location: [facility] ENDOSCOPY;  Service: Gastroenterology;;   BRONCHIAL BIOPSY  08/07/2023   Procedure: BRONCHIAL BIOPSIES;  Surgeon: Shelah Lamar RAMAN, MD;  Location: [facility] ENDOSCOPY;  Service: Pulmonary;;   BRONCHIAL BRUSHINGS  08/07/2023   Procedure: BRONCHIAL BRUSHINGS;  Surgeon: Shelah Lamar RAMAN, MD;  Location: [facility] ENDOSCOPY;  Service: Pulmonary;;   BRONCHIAL BRUSHINGS  03/17/2024   Procedure: BRONCHOSCOPY, WITH BRUSH BIOPSY;  Surgeon: Shelah Lamar RAMAN, MD;  Location: [facility] ENDOSCOPY;  Service: Pulmonary;;   BRONCHIAL NEEDLE ASPIRATION BIOPSY  08/07/2023   Procedure: BRONCHIAL NEEDLE ASPIRATION BIOPSIES;  Surgeon: Shelah Lamar RAMAN, MD;  Location: [facility] ENDOSCOPY;  Service: Pulmonary;;   BRONCHIAL NEEDLE ASPIRATION BIOPSY  03/17/2024   Procedure: BRONCHOSCOPY, WITH NEEDLE ASPIRATION BIOPSY;  Surgeon: Shelah Lamar RAMAN, MD;  Location: [facility] ENDOSCOPY;  Service: Pulmonary;;   CARDIAC DEFIBRILLATOR PLACEMENT  2007   CARPAL TUNNEL RELEASE Right 02/26/2024   Procedure: CARPAL TUNNEL RELEASE;  Surgeon: Josefina Chew, MD;  Location: [facility] OR;  Service: Orthopedics;  Laterality: Right;   CATARACT EXTRACTION, BILATERAL     COLONOSCOPY  04/14/2013   colonic polyp, status post polypectomy. Mild panocolonic diverticulosis. Small internal hemorrhoids   CRYOTHERAPY  03/17/2024   Procedure: CRYOTHERAPY;   Surgeon: Shelah Lamar RAMAN, MD;  Location: [facility] ENDOSCOPY;  Service: Pulmonary;;   DILATION AND CURETTAGE OF UTERUS  yrs ago   DIRECT LARYNGOSCOPY Right 10/04/2022   Procedure: DIRECT LARYNGOSCOPY;  Surgeon: Llewellyn Gerard LABOR, DO;  Location: [facility] OR;  Service: ENT;  Laterality: Right;   ENDOBRONCHIAL ULTRASOUND Bilateral 03/17/2024   Procedure: ENDOBRONCHIAL ULTRASOUND (EBUS);  Surgeon: Shelah Lamar RAMAN, MD;  Location: [facility] ENDOSCOPY;  Service: Pulmonary;  Laterality: Bilateral;   ESOPHAGOGASTRODUODENOSCOPY N/A 07/19/2024   Procedure: EGD (ESOPHAGOGASTRODUODENOSCOPY);  Surgeon: Elicia Claw, MD;  Location: [facility] ENDOSCOPY;  Service: Gastroenterology;  Laterality: N/A;   ESOPHAGOGASTRODUODENOSCOPY (EGD) WITH PROPOFOL  N/A 06/19/2022   Procedure: ESOPHAGOGASTRODUODENOSCOPY (EGD) WITH PROPOFOL ;  Surgeon: Elicia Claw, MD;  [facility]: [facility] ENDOS[facility];  Service: Gastroenterology;  Laterality: N/A;   EYE SURGERY  2020   Cataracts removed   FIDUCIAL MARKER PLACEMENT  08/07/2023   Procedure: FIDUCIAL MARKER PLACEMENT;  Surgeon: Shelah Lamar RAMAN, MD;  Location: [facility] ENDOSCOPY;  Service: Pulmonary;;   FOREIGN BODY REMOVAL  07/19/2024   Procedure: REMOVAL, FOREIGN BODY;  Surgeon: Elicia Claw, MD;  Location: [facility] ENDOSCOPY;  Service: Gastroenterology;;   ICD GENERATOR REMOVAL N/A 03/14/2024   Procedure: ICD GENERATOR REMOVAL;  Surgeon: Waddell Danelle ORN, MD;  [facility]: [facility] INVASIVE CV LAB;  Service: Cardiovascular;  Laterality: N/A;   IMPACTION REMOVAL  06/19/2022   Procedure: IMPACTION REMOVAL;  Surgeon: Elicia Claw, MD;  Location: [facility] ENDOSCOPY;  Service: Gastroenterology;;   IMPLANTABLE CARDIOVERTER DEFIBRILLATOR GENERATOR CHANGE N/A 02/04/2013   Procedure: IMPLANTABLE CARDIOVERTER DEFIBRILLATOR GENERATOR CHANGE;  Surgeon: Danelle ORN Waddell, MD;  Location: [facility] CATH LAB;  Service: Cardiovascular;  Laterality: N/A;   IR IMAGING GUIDED PORT INSERTION  10/27/2022   JOINT REPLACEMENT  2019, 2022, 2023   Both hips,  both knees   LYMPH NODE BIOPSY Right 10/04/2022   Procedure: EXCISIONAL OF RIGHT DEEP CERVICAL LYMPH NODE;  Surgeon: Llewellyn Gerard LABOR, DO;  [facility]: [facility] OR;  Service: ENT;  Laterality: Right;   SHOULDER ARTHROSCOPY Right 2015   TOTAL HIP ARTHROPLASTY Right 12/18/2017   Procedure: RIGHT TOTAL HIP ARTHROPLASTY;  Surgeon: Josefina Chew, MD;  [facility]: [facility] OR;  Service: Orthopedics;  Laterality: Right;   TOTAL HIP ARTHROPLASTY Left 04/19/2021   Procedure: TOTAL HIP ARTHROPLASTY;  Surgeon: Josefina Chew, MD;  Location: [facility] ORS;  Service: Orthopedics;  Laterality: Left;   TOTAL KNEE ARTHROPLASTY Right 05/28/2018   Procedure: TOTAL KNEE ARTHROPLASTY;  Surgeon: Josefina Chew, MD;  Location: [facility] ORS;  Service: Orthopedics;  Laterality: Right;  Adductor Block   TOTAL KNEE ARTHROPLASTY Left 08/12/2021   Procedure: TOTAL KNEE ARTHROPLASTY;  Surgeon: Josefina Chew, MD;  Location: [facility] ORS;  Service: Orthopedics;  Laterality: Left;   TUBAL LIGATION Bilateral yrs ago   VIDEO BRONCHOSCOPY WITH ENDOBRONCHIAL NAVIGATION Bilateral 03/17/2024   Procedure: VIDEO BRONCHOSCOPY WITH ENDOBRONCHIAL NAVIGATION;  Surgeon: Shelah Lamar RAMAN, MD;  [facility] ENDOSCOPY;  Service: Pulmonary;  Laterality: Bilateral;  need EBUS scope also   VIDEO BRONCHOSCOPY WITH RADIAL ENDOBRONCHIAL ULTRASOUND  08/07/2023   Procedure: VIDEO BRONCHOSCOPY WITH RADIAL ENDOBRONCHIAL ULTRASOUND;  Surgeon: Shelah Lamar RAMAN, MD;  [facility] ENDOSCOPY;  Service: Pulmonary;;   WISDOM TOOTH EXTRACTION      Allergies: Sumatriptan succinate  Medications: Prior to Admission medications  Medication Sig Start Date End Date Taking? Authorizing Provider  apixaban  (ELIQUIS ) 5 MG TABS tablet Take 1 tablet (5 mg total) by mouth 2 (two) times daily. 04/14/24  Yes Waddell Danelle ORN, MD  carvedilol  (COREG ) 12.5 MG tablet Take 12.5 mg by mouth 2 (two) times daily with a meal.    Yes [provider]  cyanocobalamin  (VITAMIN B12) 1000 MCG tablet Take  1,000 mcg by mouth daily.   Yes [provider]  cyclobenzaprine (FLEXERIL) 5 MG tablet Take 5 mg by mouth 3 (three) times daily as needed. 05/27/24  Yes [provider]  gabapentin  (NEURONTIN ) 300 MG capsule Take 300 mg by mouth 3 (three) times daily.   Yes [provider]  loratadine  (CLARITIN ) 10 MG tablet Take 10 mg by mouth daily.   Yes [provider]  ramipril  (ALTACE ) 2.5 MG tablet Take 2.5 mg by mouth daily.     Yes [provider]  simvastatin  (ZOCOR ) 40 MG tablet Take 40 mg by mouth daily.   Yes [provider]  traZODone  (DESYREL ) 50 MG tablet Take 50 mg by mouth at bedtime.    Yes [provider]  zonisamide  (ZONEGRAN ) 50 MG capsule Take 50-100 mg by mouth See admin instructions. Take 50mg  by mouth in the morning and 100mg  at night. 09/13/17  Yes [provider]  HYDROcodone -acetaminophen  (NORCO/VICODIN) 5-325 MG tablet Take 1 tablet by mouth every 6 (six) hours as needed for moderate pain (pain score 4-6). Patient not taking: Reported on 07/17/2024 06/04/24   Kale, Gautam Kishore, MD  ipratropium (ATROVENT  HFA) 17 MCG/ACT inhaler Inhale 2 puffs into the lungs every 6 (six) hours as needed for wheezing. Patient not taking: Reported on 07/17/2024 04/25/24   Onesimo Emaline Brink, MD     Family History  Problem Relation Age of Onset   Hypertension Mother    Thyroid  disease Mother    Alzheimer's disease Mother    Hearing loss Mother    Coronary artery disease Father    Pulmonary embolism Father    Arthritis Father    Heart disease Father    Congestive Heart Failure Maternal Grandmother    Hypertension Maternal Grandmother    Heart attack Maternal Grandfather    Other Maternal Grandfather        carotid disease   Dementia Paternal Grandmother    Other Paternal Grandfather 15       accident   Cancer Sister     Social History   Socioeconomic History   Marital status: Single    Spouse name: Not on file    Number of children: 0   Years of education: 14   Highest education level: Not on file  Occupational History   Not on file  Tobacco Use   Smoking status: Former    Current packs/day: 0.00    Types: Cigarettes    Start date: 05/21/1966    Quit date: 05/21/1996    Years since quitting: 28.1   Smokeless tobacco: Never  Vaping Use   Vaping status: Never Used  Substance and Sexual Activity   Alcohol use: No   Drug use: Never   Sexual activity: Not on file  Other Topics Concern   Not on file  Social History Narrative   Not on file   Social Drivers of Health   Tobacco Use: Medium Risk (07/19/2024)   Patient  History    Smoking Tobacco Use: Former    Smokeless Tobacco Use: Never    Passive Exposure: Not on Actuary Strain: Not on file  Food Insecurity: No Food Insecurity (07/18/2024)   Epic    Worried About Programme Researcher, Broadcasting/film/video in the Last Year: Never true    Ran Out of Food in the Last Year: Never true  Transportation Needs: No Transportation Needs (07/18/2024)   Epic    Lack of Transportation (Medical): No    Lack of Transportation (Non-Medical): No  Physical Activity: Not on file  Stress: Not on file  Social Connections: Moderately Integrated (07/18/2024)   Social Connection and Isolation Panel    Frequency of Communication with Friends and Family: More than three times a week    Frequency of Social Gatherings with Friends and Family: More than three times a week    Attends Religious Services: More than 4 times per year    Active Member of Clubs or Organizations: Yes    Attends Banker Meetings: More than 4 times per year    Marital Status: Divorced  Depression (PHQ2-9): Low Risk (06/04/2024)   Depression (PHQ2-9)    PHQ-2 Score: 0  Alcohol Screen: Not on file  Housing: Low Risk (07/18/2024)   Epic    Unable to Pay for Housing in the Last Year: No    Number of Times Moved in the Last Year: 0    Homeless in the Last Year: No  Utilities: Not At  Risk (07/18/2024)   Epic    Threatened with loss of utilities: No  Health Literacy: Not on file       Review of Systems see above; currently denies fever, headache, chest pain, dyspnea, abdominal pain, nausea, vomiting or bleeding.  She does have occasional cough, dysphagia, intermittent back pain  Vital Signs: BP 120/63 (BP Location: Left Arm)   Pulse 75   Temp 99.6 F (37.6 C)   Resp 20   Ht 5' 7 (1.702 m)   Wt 126 lb 4.8 oz (57.3 kg)   SpO2 96%   BMI 19.78 kg/m   Advance Care Plan: No documents on file  Physical Exam: Awake, alert.  Chest with distant breath sounds bilaterally; clean, intact right chest wall Port-A-Cath, left chest wall ICD in place; heart with regular rate and rhythm.  Abdomen soft, positive bowel sounds, nontender.  No lower extremity edema.  Imaging: CT ABDOMEN WO CONTRAST Result Date: 07/22/2024 CLINICAL DATA:  assess anatomy for gastrostomy tube placement Pre gastric tube placement. Barium swallow : Abrupt narrowing in mid esophagus likely secondary to left-sided paraesophageal mass based on CAT scan 2.4 x 2.3 cm(history of lymphoma and squamous cell carcinoma of right lung EXAM: CT ABDOMEN WITHOUT CONTRAST TECHNIQUE: Multidetector CT imaging of the abdomen was performed following the standard protocol without IV contrast. RADIATION DOSE REDUCTION: This exam was performed according to the departmental dose-optimization program which includes automated exposure control, adjustment of the mA and/or kV according to patient size and/or use of iterative reconstruction technique. COMPARISON:  CTA chest and CT AP, 07/17/2024. FINDINGS: Lower chest: No acute abnormality. Anterior mediastinal mass and bibasilar pulmonary nodularities. Hepatobiliary: Multifocal hepatic hypodensities. No gallstones, gallbladder wall thickening, or biliary dilatation. Pancreas: No pancreatic ductal dilatation or surrounding inflammatory changes. Spleen: Normal in size without focal  abnormality. Adrenals/Urinary Tract: Adrenal glands are unremarkable. Kidneys are normal, without renal calculi, focal lesion, or hydronephrosis. Stomach/Bowel: Intraluminal opacification of stomach and imaged small bowel, likely  previously ingested. Stomach is within normal limits. Appendix appears normal. No evidence of bowel wall thickening, distention, or inflammatory changes. Vascular/Lymphatic: Aortic atherosclerosis. No enlarged abdominal lymph nodes. Other: Mild scaphoid abdomen. No abdominal wall hernia or abnormality. Musculoskeletal: RIGHT seventh rib expansile lesion. No interval osseous abnormality. IMPRESSION: 1. Mild prominence of the LEFT hepatic lobe. Anatomy is otherwise amenable for percutaneous gastrostomy. 2. No acute abdominal findings 3. Multiple findings within the imaged chest and abdomen, better appreciated and described on recent comparison contrasted CT CAP, consistent with metastatic disease. Electronically Signed   By: Thom Hall M.D.   On: 07/22/2024 07:03   DG ESOPHAGUS W SINGLE CM (SOL OR THIN BA) Result Date: 07/21/2024 CLINICAL DATA:  Dysphagia, history of esophageal stricture. EGD completed yesterday, unable to advance endoscope past 30 cm distally in esophagus. GI suspecting extrinsic esophageal compression. Request for esophagram for further evaluation. EXAM: ESOPHAGUS/BARIUM SWALLOW/TABLET STUDY TECHNIQUE: Single contrast examination was performed using thin liquid barium. This exam was performed by Kimble Clas, PA-C, and was supervised and interpreted by Dr. MARLA Kilts. FLUOROSCOPY: Radiation Exposure Index (as provided by the fluoroscopic device): 13.70 mGy Kerma COMPARISON:  None Available. FINDINGS: Exam limited secondary to poor PO intake ability. Esophagus: Marked area of esophageal narrowing in the midthoracic esophagus with slowing of barium passage. Appears to be in similar location to area described in recent EGD, roughly 30 cm distal. Esophageal motility:  Significant esophageal dysmotility with lying prone. Mild dysmotility with standing upright. Gastroesophageal reflux: None visualized. Other: None. IMPRESSION: Limited exam secondary to patient unable to drink consecutive swallows. Abrupt narrowing of the mid esophageal contrast column with partial obstruction. When correlated with the CT of 07/17/2024, this is likely secondary to a left-sided paraesophageal mass (most likely nodal) including on image 82 of series 6. This mass is estimated at 2.4 x 2.3 cm. Given EMR history of lymphoma and squamous cell carcinoma of the right lung, this presumably represents metastatic disease or lymphomatous involvement. Esophageal dysmotility, likely presbyesophagus. Electronically Signed   By: Rockey Kilts M.D.   On: 07/21/2024 11:03   CT ABDOMEN PELVIS W CONTRAST Result Date: 07/17/2024 EXAM: CTA CHEST PE WITH AND WITHOUT CONTRAST CT ABDOMEN AND PELVIS WITH CONTRAST 07/17/2024 06:06:12 PM TECHNIQUE: CTA of the chest was performed after the administration of 100 mL of iohexol  (OMNIPAQUE ) 350 MG/ML injection. Multiplanar reformatted images are provided for review. MIP images are provided for review. CT of the abdomen and pelvis was performed with the administration of intravenous contrast. Automated exposure control, iterative reconstruction, and/or weight based adjustment of the mA/kV was utilized to reduce the radiation dose to as low as reasonably achievable. COMPARISON: CT chest abdomen and pelvis 05/27/2024. CLINICAL HISTORY: Abdominal pain, nausea, vomiting, cancer, weight loss. Pulmonary embolus suspected with high probability. Feels like something in the chest. Difficulty swallowing. FINDINGS: CHEST: PULMONARY ARTERIES: Pulmonary arteries are adequately opacified for evaluation. No intraluminal filling defect to suggest pulmonary embolism. Main pulmonary artery is normal in caliber. MEDIASTINUM: Right central venous catheter with tip in the SVC. Normal heart size. No  pericardial effusions. Air-fluid level and debris in the esophagus with esophageal dilatation down to the level of about T8 with distal decompression of the esophagus. This is concerning for esophageal obstruction, possibly due to mass, stricture, or extrinsic compression. Right supraclavicular lymphadenopathy, today measuring 2.6 cm in diameter. Right paratracheal lymphadenopathy measuring up to 1.2 cm short axis. Developing right hilar mass or lymphadenopathy measuring 2.4 cm diameter. There is no acute abnormality of the  thoracic aorta. LUNGS AND PLEURA: Consolidation in the right middle lung likely representing postobstructive change. Pneumonia would be a less likely consideration. Multiple pulmonary nodules are demonstrated throughout the lungs. Largest are in the right lung bases measuring up to 1.5 x 0.9 cm. Pulmonary nodularity is new since the prior study. 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 No pleural effusion or pneumothorax. SOFT TISSUES AND BONES: Degenerative changes in the spine. Expansile soft tissue lesion involving the right anterior 7th rib, enlarging since prior study, consistent with bone metastasis. ABDOMEN AND PELVIS: LIVER: Diffuse fatty infiltration of the liver. Multiple heterogeneous hypoenhancing liver lesions consistent with metastatic disease. The largest is in segment 5 measuring 3 cm diameter. This is progressing since the previous study. GALLBLADDER AND BILE DUCTS: Gallbladder is unremarkable. No biliary ductal dilatation. SPLEEN: Spleen demonstrates no acute abnormality. PANCREAS: Pancreas demonstrates no acute abnormality. ADRENAL GLANDS: Adrenal glands demonstrate no acute abnormality. KIDNEYS, URETERS AND BLADDER: No stones in the kidneys or ureters. No hydronephrosis. No perinephric or periureteral stranding. The bladder is obscured by streak artifact from hip arthroplasties. GI  AND BOWEL: Stomach and duodenal sweep demonstrate no acute abnormality. There is no bowel obstruction. No abnormal bowel wall thickening or distension. Stool throughout the colon. The appendix is normal. REPRODUCTIVE: The uterus and ovaries are not enlarged. PERITONEUM AND RETROPERITONEUM: No ascites or free air. LYMPH NODES: No retroperitoneal lymphadenopathy. 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 Streak artifact from the arthroplasties limits visualization of the low pelvis. Degenerative changes in the lumbar spine with mild lumbar scoliosis convex towards the right. Calcification of the abdominal aorta. No focal soft tissue abnormality. IMPRESSION: 1. No pulmonary embolism. 2. Esophageal obstruction with air-fluid level and debris, possibly due to mass, stricture, or extrinsic compression. 3. Right supraclavicular, mediastinal, and right hilar lymphadenopathy. , concerning for malignancy. 4. Right hilar mass with right middle lobe consolidation, likely postobstructive change. 5. 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 6. Multiple heterogeneous hypoenhancing liver lesions consistent with metastatic disease, progressing since the previous study. 7. Expansile soft tissue lesion involving the right anterior 7th rib, enlarging since prior study, consistent with bone metastasis. Electronically signed by: Elsie Gravely MD 07/17/2024 06:24 PM EST RP Workstation: [id number]   CT Angio Chest PE W and/or Wo Contrast Result Date: 07/17/2024 EXAM: CTA CHEST PE WITH AND WITHOUT CONTRAST CT ABDOMEN AND PELVIS WITH CONTRAST 07/17/2024 06:06:12 PM TECHNIQUE: CTA of the chest was performed after the administration of 100 mL of iohexol  (OMNIPAQUE ) 350 MG/ML injection. Multiplanar reformatted images are provided for review. MIP images are provided for review. CT of the abdomen and pelvis was performed with the  administration of intravenous contrast. Automated exposure control, iterative reconstruction, and/or weight based adjustment of the mA/kV was utilized to reduce the radiation dose to as low as reasonably achievable. COMPARISON: CT chest abdomen and pelvis 05/27/2024. CLINICAL HISTORY: Abdominal pain, nausea, vomiting, cancer, weight loss. Pulmonary embolus suspected with high probability. Feels like something in the chest. Difficulty swallowing. FINDINGS: CHEST: PULMONARY ARTERIES: Pulmonary arteries are adequately opacified for evaluation. No intraluminal filling defect to suggest pulmonary embolism. Main pulmonary artery is normal in caliber. MEDIASTINUM: Right central venous catheter with tip in the SVC. Normal heart size. No pericardial effusions. Air-fluid level and debris in the esophagus with esophageal dilatation down to the level of about T8 with distal decompression of the esophagus. This  is concerning for esophageal obstruction, possibly due to mass, stricture, or extrinsic compression. Right supraclavicular lymphadenopathy, today measuring 2.6 cm in diameter. Right paratracheal lymphadenopathy measuring up to 1.2 cm short axis. Developing right hilar mass or lymphadenopathy measuring 2.4 cm diameter. There is no acute abnormality of the thoracic aorta. LUNGS AND PLEURA: Consolidation in the right middle lung likely representing postobstructive change. Pneumonia would be a less likely consideration. Multiple pulmonary nodules are demonstrated throughout the lungs. Largest are in the right lung bases measuring up to 1.5 x 0.9 cm. Pulmonary nodularity is new since the prior study. 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 No pleural effusion or pneumothorax. SOFT TISSUES AND BONES: Degenerative changes in the spine. Expansile soft tissue lesion involving the right anterior 7th rib, enlarging since prior study,  consistent with bone metastasis. ABDOMEN AND PELVIS: LIVER: Diffuse fatty infiltration of the liver. Multiple heterogeneous hypoenhancing liver lesions consistent with metastatic disease. The largest is in segment 5 measuring 3 cm diameter. This is progressing since the previous study. GALLBLADDER AND BILE DUCTS: Gallbladder is unremarkable. No biliary ductal dilatation. SPLEEN: Spleen demonstrates no acute abnormality. PANCREAS: Pancreas demonstrates no acute abnormality. ADRENAL GLANDS: Adrenal glands demonstrate no acute abnormality. KIDNEYS, URETERS AND BLADDER: No stones in the kidneys or ureters. No hydronephrosis. No perinephric or periureteral stranding. The bladder is obscured by streak artifact from hip arthroplasties. GI AND BOWEL: Stomach and duodenal sweep demonstrate no acute abnormality. There is no bowel obstruction. No abnormal bowel wall thickening or distension. Stool throughout the colon. The appendix is normal. REPRODUCTIVE: The uterus and ovaries are not enlarged. PERITONEUM AND RETROPERITONEUM: No ascites or free air. LYMPH NODES: No retroperitoneal lymphadenopathy. 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 Streak artifact from the arthroplasties limits visualization of the low pelvis. Degenerative changes in the lumbar spine with mild lumbar scoliosis convex towards the right. Calcification of the abdominal aorta. No focal soft tissue abnormality. IMPRESSION: 1. No pulmonary embolism. 2. Esophageal obstruction with air-fluid level and debris, possibly due to mass, stricture, or extrinsic compression. 3. Right supraclavicular, mediastinal, and right hilar lymphadenopathy. , concerning for malignancy. 4. Right hilar mass with right middle lobe consolidation, likely postobstructive change. 5. 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 6. Multiple heterogeneous hypoenhancing liver lesions  consistent with metastatic disease, progressing since the previous study. 7. Expansile soft tissue lesion involving the right anterior 7th rib, enlarging since prior study, consistent with bone metastasis. Electronically signed by: Elsie Gravely MD 07/17/2024 06:24 PM EST RP Workstation: [id number]    Labs:  CBC: Recent Labs    07/18/24 0527 07/19/24 1035 07/20/24 0658 07/21/24 0445  WBC 3.5* 3.8* 3.5* 3.4*  HGB 13.6 12.9 13.0 12.8  HCT 40.1 39.1 39.8 39.0  PLT 114* 106* 111* 116*    COAGS: Recent Labs    07/18/24 0527  INR 1.1    BMP: Recent Labs    07/18/24 0527 07/19/24 1035 07/20/24 0658 07/21/24 0445  NA 141 139 140 140  K 3.6 4.0 4.6 4.1  CL 107 104 104 105  CO2 25 27 29 28   GLUCOSE 85 92 94 104*  BUN 7* 6* <5* 5*  CALCIUM  9.0 9.3 9.9 9.6  CREATININE 0.50 0.55 0.56 0.48  GFRNONAA >60 >60 >60 >60    LIVER FUNCTION TESTS: Recent Labs    05/07/24 1337 05/21/24 1027 06/04/24 1143 07/17/24 1430  BILITOT 0.4 0.4 0.4 1.1  AST 15 14* 23 24  ALT 12 11 17 15   ALKPHOS 93 87 96 96  PROT 6.6 6.7 6.9 6.8  ALBUMIN  4.0 4.0 4.2 4.1    TUMOR MARKERS: No results for input(s): AFPTM, CEA, CA199, CHROMGRNA in the last 8760 hours.  Assessment and Plan: 79 y.o. 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  Request received from primary care team for percutaneous gastrostomy tube placement. Imaging studies have been reviewed by IR team. Risks and benefits of image guided gastrostomy tube placement was  discussed with the patient/daughter including, but not limited to the need for a barium enema during the procedure, bleeding, infection, peritonitis and/or damage to adjacent structures.  All of the patient's questions were answered, patient is agreeable to proceed.  Consent signed and in chart.  Procedure tentatively planned for 1/14; last dose of eliquis  was 1/12 am  Thank you for allowing our service to participate in Rachel Bates 's care.  Electronically Signed: D. Franky Rakers, PA-C   07/22/2024, 4:37 PM      I spent a total of  40 minutes   in face to face in clinical consultation, greater than 50% of which was counseling/coordinating care for percutaneous gastrostomy tube placement   "

## 2024-07-22 NOTE — Progress Notes (Signed)
 Initial Nutrition Assessment  DOCUMENTATION CODES:   Severe malnutrition in context of chronic illness  INTERVENTION:  - Once PEG placed and able to be used, would recommend initiating below TF regimen: Osmolite 1.5 at 50 ml/h (1200 ml per day) *Would recommend starting at 70mL/hr and advancing by 10mL Q12H Provides 1800 kcal, 75 gm protein, 914 ml free water  daily  - Monitor magnesium , potassium, and phosphorus daily for at least 3 days, MD to replete as needed, as pt is at risk for refeeding syndrome given severe malnutrition with significant weight loss, inadequate oral intake x 1.5 months, and severe muscle and fat wasting. - Recommend 100mg  Thiamine daily for 7 days.  - Consider 125mL Q4H FWF (750mL) to aid in meeting hydration needs. In addition to tube feeds at goal, would provide 1669mL/day.  - Monitor weight trends.   NUTRITION DIAGNOSIS:   Severe Malnutrition related to chronic illness, dysphagia as evidenced by severe fat depletion, severe muscle depletion, percent weight loss (18% in < 5 months).  GOAL:   Patient will meet greater than or equal to 90% of their needs  MONITOR:   Diet advancement, Labs, Weight trends, TF tolerance  REASON FOR ASSESSMENT:   Consult Assessment of nutrition requirement/status  ASSESSMENT:   79 y.o. 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 Admitted for malignant esophageal stenosis.   Patient in bed at time of visit, daughter at bedside.   Patient reports a UBW of 160# prior to cancer treatment and weight loss over the past year.  Per EMR, patient weighed consistently around 152-154# from January 2025 - August 2025. She was weighed at 153# in August and has since lost to current weight of 126#. This is a 27# or 18% weight loss in less than 5 months, which  is significant for the time frame.   Patient endorses eating well until about 1 month ago. Previously would usually eat 2 meals a day at home. Over the past 1.5 months, started developing swallowing difficulty and this has resulted in her not being able to eat very much as food seems to get stuck. She was not consuming any ONS at home. Had actually just bought some to start consuming but had to come into the hospital for worsening issues.   Since admission, patient has been on a liquid diet but notes it is limiting. Ordered Ensure BID but she only likes strawberry and as our facility no longer carries strawberry so she has not consumed any since 1/11. Daughter brought her in strawberry flavored ONS but patient was made NPO this morning.  Patient agreeable to PEG placement which is planned for tomorrow. Plan to meet 100% of needs via tube feeds. Will initiate patient on continuous feeds once tube placed due to high risk of refeeding syndrome. Eventual goal can be to transition to bolus tube feeds as tolerated and if electrolytes stable.   Medications reviewed and include: D5 @ 11mL/hr (provides 408 kcals over 24 hours)  Labs reviewed:  -  NUTRITION - FOCUSED PHYSICAL EXAM:  Flowsheet Row Most Recent Value  Orbital Region Severe depletion  Upper Arm Region Moderate depletion  Thoracic and Lumbar Region Moderate depletion  Buccal Region Severe depletion  Temple Region Severe depletion  Clavicle Bone Region Severe depletion  Clavicle and Acromion Bone Region Severe depletion  Scapular Bone Region Unable to assess  Dorsal Hand Severe depletion  Patellar Region Moderate depletion  Anterior Thigh Region Moderate depletion  Posterior Calf Region Mild depletion  Edema (RD Assessment) None  Hair Reviewed  Eyes Reviewed  Mouth Reviewed  Skin Reviewed  Nails Reviewed    Diet Order:   Diet Order             Diet NPO time specified  Diet effective now                   EDUCATION  NEEDS:  Education needs have been addressed  Skin:  Skin Assessment: Reviewed RN Assessment  Last BM:  1/10  Height:  Ht Readings from Last 1 Encounters:  07/18/24 5' 7 (1.702 m)   Weight:  Wt Readings from Last 1 Encounters:  07/18/24 57.3 kg   Ideal Body Weight:  61.36 kg  BMI:  Body mass index is 19.78 kg/m.  Estimated Nutritional Needs:  Kcal:  1700-1900 kcals Protein:  70-85 grams Fluid:  >/= 1.7L    Trude Ned RD, LDN Contact via Secure Chat.

## 2024-07-22 NOTE — Progress Notes (Signed)
 " Progress Note   Patient: Rachel Bates FMW:[id number] DOB: 1946-02-18 DOA: 07/17/2024     5 DOS: the patient was seen and examined on 07/22/2024 at 9:50 AM      Brief hospital course: 79 y.o. 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  GI consulted, underwent EGD with dilation.    After dilation, she underwent a barium esophagram that showed persistent narrowing at the level of her paraesophageal adenopathy.  GI recommended PEG tube, which is tentatively planned for 1/14.     Assessment and Plan: Malignant esophageal stenosis Severe protein calorie malnutrition In the last several weeks, developed progressive dysphagia to solids, lost 20 pounds in 5 weeks, went to oncology who sent her to the ER.  CT in the ER showed fluid-filled esophagus, air-fluid level.  Admitted and underwent EGD that showed likely extrinsic narrowing.  Dilation attempted.  Follow-up esophagram on 1/12 showed persistent narrowing at the same level as the paraesophageal mass.  I recommended PEG placement.  IR consulted.  In discussion with family today, they had questions about futility of placing a PEG tube in the setting of uncertain prognosis.  Given her intermittent dysphagia for liquids in the hospital, I suspect without a PEG tube she has a prognosis of a few weeks. - Consult oncology for discussion of prognosis - PEG tube tentatively plan for 1/14     Squamous carcinoma of the right upper lobe of lung, metastatic to lymph node, liver, and bone S/p carboplatin  paclitaxel  and now on Durvalumab . Follows with Dr. Onesimo. Of concern, CT on admision showed new nodularity in lungs and new hypodensities in the liver concerning for metastases.   Chronic systolic congestive heart failure Coronary artery disease Hypertension Blood  pressure normal - Continue Coreg , ramipril , simvastatin    Paroxysmal atrial fibrillation Rate controlled - Continue carvedilol  -Hold apixaban  for procedure   History of venous thromboembolism See above   History of non-Hodgkin lymphoma Pleated treatment in 2024, R-CEOP, follows with Dr. Onesimo   Leukopenia Thrombocytopenia Cancer related, stable.        Subjective: Patient has no complaints.  She had difficulty swallowing grits and water  this morning.  No respiratory distress.     Physical Exam: BP 120/63 (BP Location: Left Arm)   Pulse 75   Temp 99.6 F (37.6 C)   Resp 20   Ht 5' 7 (1.702 m)   Wt 57.3 kg   SpO2 96%   BMI 19.78 kg/m   Thin adult female, lying in bed, no acute distress RRR, no murmurs, no peripheral edema Respiratory rate normal, lungs clear without rales or wheezes Abdomen soft, no tenderness palpation or guarding, no ascites or distention Attention normal, affect normal, judgment insight appear normal    Data Reviewed: Discussed with oncology      Family Communication: Daughter at the bedside    Disposition: Status is: Inpatient 79 y.o. F with metastatic lung cancer who presented with malignant esophageal obstruction.  Oncology will discuss goals of care with her this evening.  If she wishes to proceed with PEG tube, this will happen tomorrow, then she will need training on PEG feeding and discharge Thursday  If she elects not to proceed with PEG, she will need enrollment in hospice and discharge later this week        Author:  Lonni SHAUNNA Dalton, MD 07/22/2024 5:20 PM  For on call review [email or web address].    "

## 2024-07-23 ENCOUNTER — Inpatient Hospital Stay (HOSPITAL_COMMUNITY)

## 2024-07-23 ENCOUNTER — Encounter (HOSPITAL_COMMUNITY)
Admission: EM | Disposition: A | Discharge status: Hospice | Payer: Self-pay | Source: Ambulatory Visit | Attending: Family Medicine

## 2024-07-23 ENCOUNTER — Inpatient Hospital Stay

## 2024-07-23 ENCOUNTER — Inpatient Hospital Stay (HOSPITAL_COMMUNITY): Admitting: Certified Registered Nurse Anesthetist

## 2024-07-23 ENCOUNTER — Encounter (HOSPITAL_COMMUNITY): Payer: Self-pay | Admitting: Internal Medicine

## 2024-07-23 DIAGNOSIS — I509 Heart failure, unspecified: Secondary | ICD-10-CM

## 2024-07-23 DIAGNOSIS — C8590 Non-Hodgkin lymphoma, unspecified, unspecified site: Secondary | ICD-10-CM | POA: Diagnosis not present

## 2024-07-23 DIAGNOSIS — J9601 Acute respiratory failure with hypoxia: Secondary | ICD-10-CM | POA: Diagnosis not present

## 2024-07-23 DIAGNOSIS — K222 Esophageal obstruction: Secondary | ICD-10-CM | POA: Diagnosis not present

## 2024-07-23 DIAGNOSIS — E785 Hyperlipidemia, unspecified: Secondary | ICD-10-CM | POA: Diagnosis not present

## 2024-07-23 DIAGNOSIS — I5023 Acute on chronic systolic (congestive) heart failure: Secondary | ICD-10-CM | POA: Diagnosis not present

## 2024-07-23 DIAGNOSIS — I110 Hypertensive heart disease with heart failure: Secondary | ICD-10-CM | POA: Diagnosis not present

## 2024-07-23 DIAGNOSIS — C8338 Diffuse large B-cell lymphoma, lymph nodes of multiple sites: Secondary | ICD-10-CM | POA: Diagnosis not present

## 2024-07-23 DIAGNOSIS — I2510 Atherosclerotic heart disease of native coronary artery without angina pectoris: Secondary | ICD-10-CM

## 2024-07-23 DIAGNOSIS — R1310 Dysphagia, unspecified: Secondary | ICD-10-CM | POA: Diagnosis not present

## 2024-07-23 DIAGNOSIS — C3491 Malignant neoplasm of unspecified part of right bronchus or lung: Secondary | ICD-10-CM | POA: Diagnosis not present

## 2024-07-23 DIAGNOSIS — I480 Paroxysmal atrial fibrillation: Secondary | ICD-10-CM | POA: Diagnosis not present

## 2024-07-23 DIAGNOSIS — Z8572 Personal history of non-Hodgkin lymphomas: Secondary | ICD-10-CM | POA: Diagnosis not present

## 2024-07-23 DIAGNOSIS — C3411 Malignant neoplasm of upper lobe, right bronchus or lung: Secondary | ICD-10-CM | POA: Diagnosis not present

## 2024-07-23 DIAGNOSIS — E43 Unspecified severe protein-calorie malnutrition: Secondary | ICD-10-CM | POA: Diagnosis not present

## 2024-07-23 DIAGNOSIS — D696 Thrombocytopenia, unspecified: Secondary | ICD-10-CM | POA: Diagnosis not present

## 2024-07-23 HISTORY — PX: FOREIGN BODY RETRIEVAL: CATH118241

## 2024-07-23 HISTORY — PX: ESOPHAGEAL STENT PLACEMENT: SHX5540

## 2024-07-23 HISTORY — PX: SAVORY DILATION: SHX5439

## 2024-07-23 HISTORY — PX: ESOPHAGOGASTRODUODENOSCOPY: SHX5428

## 2024-07-23 LAB — COMPREHENSIVE METABOLIC PANEL WITH GFR
ALT: 10 U/L (ref 0–44)
AST: 18 U/L (ref 15–41)
Albumin: 3.4 g/dL — ABNORMAL LOW (ref 3.5–5.0)
Alkaline Phosphatase: 87 U/L (ref 38–126)
Anion gap: 8 (ref 5–15)
BUN: <5 mg/dL — ABNORMAL LOW (ref 8–23)
CO2: 25 mmol/L (ref 22–32)
Calcium: 9.4 mg/dL (ref 8.9–10.3)
Chloride: 106 mmol/L (ref 98–111)
Creatinine, Ser: 0.46 mg/dL (ref 0.44–1.00)
GFR, Estimated: >60 mL/min
Glucose, Bld: 102 mg/dL — ABNORMAL HIGH (ref 70–99)
Potassium: 4 mmol/L (ref 3.5–5.1)
Sodium: 139 mmol/L (ref 135–145)
Total Bilirubin: 0.9 mg/dL (ref 0.0–1.2)
Total Protein: 5.9 g/dL — ABNORMAL LOW (ref 6.5–8.1)

## 2024-07-23 LAB — CBC WITH DIFFERENTIAL/PLATELET
Abs Immature Granulocytes: 0.01 K/uL (ref 0.00–0.07)
Basophils Absolute: 0 K/uL (ref 0.0–0.1)
Basophils Relative: 0 %
Eosinophils Absolute: 0.1 K/uL (ref 0.0–0.5)
Eosinophils Relative: 1 %
HCT: 40.8 % (ref 36.0–46.0)
Hemoglobin: 13.5 g/dL (ref 12.0–15.0)
Immature Granulocytes: 0 %
Lymphocytes Relative: 10 %
Lymphs Abs: 0.4 K/uL — ABNORMAL LOW (ref 0.7–4.0)
MCH: 31.2 pg (ref 26.0–34.0)
MCHC: 33.1 g/dL (ref 30.0–36.0)
MCV: 94.2 fL (ref 80.0–100.0)
Monocytes Absolute: 0.5 K/uL (ref 0.1–1.0)
Monocytes Relative: 11 %
Neutro Abs: 3.1 K/uL (ref 1.7–7.7)
Neutrophils Relative %: 78 %
Platelets: 118 K/uL — ABNORMAL LOW (ref 150–400)
RBC: 4.33 MIL/uL (ref 3.87–5.11)
RDW: 13.7 % (ref 11.5–15.5)
WBC: 4 K/uL (ref 4.0–10.5)
nRBC: 0 % (ref 0.0–0.2)

## 2024-07-23 LAB — BLOOD GAS, ARTERIAL
Acid-base deficit: 0.6 mmol/L (ref 0.0–2.0)
Acid-base deficit: 1.1 mmol/L (ref 0.0–2.0)
Bicarbonate: 27.3 mmol/L (ref 20.0–28.0)
Bicarbonate: 25.3 mmol/L (ref 20.0–28.0)
Drawn by: 75061
Drawn by: 75061
FIO2: 100 %
FIO2: 60 %
MECHVT: 490 mL
MECHVT: 490 mL
O2 Saturation: 100 %
O2 Saturation: 100 %
PEEP: 5 cmH2O
PEEP: 5 cmH2O
Patient temperature: 36.9
Patient temperature: 36.4
RATE: 15 {breaths}/min
RATE: 20 {breaths}/min
pCO2 arterial: 58 mmHg — ABNORMAL HIGH (ref 32–48)
pCO2 arterial: 47 mmHg (ref 32–48)
pH, Arterial: 7.28 — ABNORMAL LOW (ref 7.35–7.45)
pH, Arterial: 7.34 — ABNORMAL LOW (ref 7.35–7.45)
pO2, Arterial: 216 mmHg — ABNORMAL HIGH (ref 83–108)
pO2, Arterial: 92 mmHg (ref 83–108)

## 2024-07-23 LAB — GLUCOSE, CAPILLARY
Glucose-Capillary: 126 mg/dL — ABNORMAL HIGH (ref 70–99)
Glucose-Capillary: 129 mg/dL — ABNORMAL HIGH (ref 70–99)
Glucose-Capillary: 137 mg/dL — ABNORMAL HIGH (ref 70–99)

## 2024-07-23 LAB — MRSA NEXT GEN BY PCR, NASAL: MRSA by PCR Next Gen: NOT DETECTED

## 2024-07-23 MED ORDER — ONDANSETRON HCL 8 MG PO TABS: 8.0000 mg | ORAL_TABLET | Freq: Three times a day (TID) | ORAL | 1 refills | Status: DC | PRN

## 2024-07-23 MED ORDER — FENTANYL CITRATE (PF) 100 MCG/2ML IJ SOLN
50.0000 ug | INTRAMUSCULAR | Status: DC | PRN
  Administered 2024-07-23: 75 ug via INTRAVENOUS
  Administered 2024-07-23 – 2024-07-24 (×4): 50 ug via INTRAVENOUS

## 2024-07-23 MED ORDER — FENTANYL CITRATE (PF) 100 MCG/2ML IJ SOLN
50.0000 ug | INTRAMUSCULAR | Status: DC | PRN
  Administered 2024-07-24 (×2): 50 ug via INTRAVENOUS

## 2024-07-23 MED ORDER — SODIUM CHLORIDE 0.9 % IV SOLN
3.0000 g | Freq: Four times a day (QID) | INTRAVENOUS | Status: DC
  Administered 2024-07-23 – 2024-07-26 (×11): 3 g via INTRAVENOUS
  Filled 2024-07-23 (×11): qty 8

## 2024-07-23 MED ORDER — ORAL CARE MOUTH RINSE: 15.0000 mL | OROMUCOSAL | Status: DC | PRN

## 2024-07-23 MED ORDER — LACTATED RINGERS IV SOLN: INTRAVENOUS | Status: DC | PRN

## 2024-07-23 MED ORDER — PHENYLEPHRINE 80 MCG/ML (10ML) SYRINGE FOR IV PUSH (FOR BLOOD PRESSURE SUPPORT)
PREFILLED_SYRINGE | INTRAVENOUS | Status: AC
  Filled 2024-07-23: qty 10

## 2024-07-23 MED ORDER — PROPOFOL 1000 MG/100ML IV EMUL
0.0000 ug/kg/min | INTRAVENOUS | Status: DC
  Administered 2024-07-23: 20 ug/kg/min via INTRAVENOUS
  Administered 2024-07-23: 30 ug/kg/min via INTRAVENOUS
  Administered 2024-07-23 – 2024-07-24 (×2): 50 ug/kg/min via INTRAVENOUS
  Filled 2024-07-23 (×3): qty 100

## 2024-07-23 MED ORDER — DEXAMETHASONE SOD PHOSPHATE PF 10 MG/ML IJ SOLN
INTRAMUSCULAR | Status: DC | PRN
  Administered 2024-07-23: 10 mg via INTRAVENOUS

## 2024-07-23 MED ORDER — PROPOFOL 500 MG/50ML IV EMUL
INTRAVENOUS | Status: DC | PRN
  Administered 2024-07-23: 100 ug/kg/min via INTRAVENOUS

## 2024-07-23 MED ORDER — IPRATROPIUM-ALBUTEROL 0.5-2.5 (3) MG/3ML IN SOLN
RESPIRATORY_TRACT | Status: AC
  Filled 2024-07-23: qty 3

## 2024-07-23 MED ORDER — SUCCINYLCHOLINE CHLORIDE 200 MG/10ML IV SOSY
PREFILLED_SYRINGE | INTRAVENOUS | Status: DC | PRN
  Administered 2024-07-23: 100 mg via INTRAVENOUS

## 2024-07-23 MED ORDER — FENTANYL 2500MCG IN NS 250ML (10MCG/ML) PREMIX INFUSION: 0.0000 ug/h | INTRAVENOUS | Status: DC

## 2024-07-23 MED ORDER — PROPOFOL 500 MG/50ML IV EMUL
INTRAVENOUS | Status: AC
  Filled 2024-07-23: qty 50

## 2024-07-23 MED ORDER — ORAL CARE MOUTH RINSE
15.0000 mL | OROMUCOSAL | Status: DC
  Administered 2024-07-23 – 2024-07-24 (×13): 15 mL via OROMUCOSAL

## 2024-07-23 MED ORDER — NOREPINEPHRINE 4 MG/250ML-% IV SOLN
0.0000 ug/min | INTRAVENOUS | Status: DC
  Administered 2024-07-25: 4 ug/min via INTRAVENOUS
  Administered 2024-07-25: 9 ug/min via INTRAVENOUS
  Filled 2024-07-23 (×3): qty 250

## 2024-07-23 MED ORDER — OXYCODONE HCL 20 MG/ML PO CONC: 5.0000 mg | Freq: Four times a day (QID) | ORAL | Status: DC | PRN

## 2024-07-23 MED ORDER — LIDOCAINE 2% (20 MG/ML) 5 ML SYRINGE
INTRAMUSCULAR | Status: DC | PRN
  Administered 2024-07-23: 100 mg via INTRAVENOUS

## 2024-07-23 MED ORDER — SODIUM CHLORIDE 0.9 % IV SOLN: INTRAVENOUS | Status: DC

## 2024-07-23 MED ORDER — DEXAMETHASONE 4 MG PO TABS: ORAL_TABLET | ORAL | 1 refills | Status: DC

## 2024-07-23 MED ORDER — POLYETHYLENE GLYCOL 3350 17 G PO PACK: 17.0000 g | PACK | Freq: Every day | ORAL | Status: DC

## 2024-07-23 MED ORDER — FENTANYL 2500MCG IN NS 250ML (10MCG/ML) PREMIX INFUSION
INTRAVENOUS | Status: AC
  Administered 2024-07-23: 50 ug/h via INTRAVENOUS
  Filled 2024-07-23: qty 250

## 2024-07-23 MED ORDER — PROPOFOL 10 MG/ML IV BOLUS
INTRAVENOUS | Status: DC | PRN
  Administered 2024-07-23: 20 mg via INTRAVENOUS

## 2024-07-23 MED ORDER — MIDAZOLAM HCL 2 MG/2ML IJ SOLN
INTRAMUSCULAR | Status: AC
  Administered 2024-07-23: 2 mg
  Filled 2024-07-23: qty 4

## 2024-07-23 MED ORDER — SENNA 8.6 MG PO TABS
1.0000 | ORAL_TABLET | Freq: Two times a day (BID) | ORAL | Status: DC
  Filled 2024-07-23: qty 1

## 2024-07-23 MED ORDER — IPRATROPIUM-ALBUTEROL 0.5-2.5 (3) MG/3ML IN SOLN: 3.0000 mL | RESPIRATORY_TRACT | Status: DC | PRN

## 2024-07-23 MED ORDER — LORAZEPAM 2 MG/ML PO CONC: 0.5000 mg | Freq: Three times a day (TID) | ORAL | Status: DC | PRN

## 2024-07-23 MED ORDER — PROCHLORPERAZINE MALEATE 10 MG PO TABS: 10.0000 mg | ORAL_TABLET | Freq: Four times a day (QID) | ORAL | 1 refills | Status: DC | PRN

## 2024-07-23 MED ORDER — ONDANSETRON HCL 4 MG/2ML IJ SOLN
INTRAMUSCULAR | Status: DC | PRN
  Administered 2024-07-23: 4 mg via INTRAVENOUS

## 2024-07-23 MED ORDER — LIDOCAINE-PRILOCAINE 2.5-2.5 % EX CREA: TOPICAL_CREAM | CUTANEOUS | 3 refills | Status: DC

## 2024-07-23 MED ORDER — ALBUTEROL SULFATE HFA 108 (90 BASE) MCG/ACT IN AERS
INHALATION_SPRAY | RESPIRATORY_TRACT | Status: DC | PRN
  Administered 2024-07-23: 4 via RESPIRATORY_TRACT

## 2024-07-23 MED ORDER — IPRATROPIUM-ALBUTEROL 0.5-2.5 (3) MG/3ML IN SOLN
RESPIRATORY_TRACT | Status: AC
  Administered 2024-07-23: 3 mL
  Filled 2024-07-23: qty 3

## 2024-07-23 MED ORDER — NOREPINEPHRINE 4 MG/250ML-% IV SOLN
INTRAVENOUS | Status: AC
  Administered 2024-07-23: 2 ug/min via INTRAVENOUS
  Filled 2024-07-23: qty 250

## 2024-07-23 NOTE — Progress Notes (Addendum)
 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  Dysphagia is noted to be ongoing since last Thanksgiving.  [facility] admitted and GI consulted and patient underwent EGD with dilatation but unfortunately unsuccessful due to significant stenosis/narrowing of the esophagus.  Patient was planned to receive a feeding tube-PEG for palliative/comfort feeding but patient decided to not pursue.  However esophageal stent was offered and patient accepted to try esophageal stent versus PEG tube placement.  Esophageal stent was placed on 1/14 within the endoscopy suite, unfortunately patient aspirated severely and required to remain on mechanical ventilation status post esophageal stent.  PCCM was consulted via [facility] for further management while patient is on mechanical ventilator.  Patient transferred to ICU for further management and care.  Per anesthesia report-patient had significant white thick secretions in back of oropharynx Initially obstructing ability to place esophageal stent.  This was suctioned and removed but ultimately patient had aspirated and became hypoxic requiring to remain mechanically intubated.  Pertinent  Medical History   Past Medical History:  Diagnosis Date   AICD (automatic cardioverter/defibrillator) present dual   [employer]tr[employer] - original placed 2007, generator change 2014, generator extraction 03/14/2024   Anemia    Anticoagulant  long-term use    eliquis    Anxiety    Arthralgia of multiple joints    Arthritis pain    Benign hypertensive heart disease    CAD (coronary artery disease) primary cardiologist-- dr danelle taylor   MI and 2 stents 1995 in [place]    Cancer [facility])    Cataract ?   Surgery 2020   CHF NYHA class II, chronic, systolic (HCC)    followed by dr danelle taylor   Complication of anesthesia    Degenerative disc disease, lumbar    Depression    Difficult intubation    Dyslipidemia    History of basal cell carcinoma (BCC) excision    right ankle area s/p  excision in office 06/ 2019  in office   History of DVT of lower extremity yrs ago before 2012   History of MI (myocardial infarction) 1995  in [place]   History of pulmonary embolus (PE) 2012   History of ventricular tachycardia    Hyperlipidemia    Hypersomnia    Hypertension    Ischemic cardiomyopathy    Migraines    Myocardial infarction (HCC)    OA (osteoarthritis)    all over   PAF (paroxysmal atrial fibrillation) (HCC)    Pneumonia    02/2023   Primary localized osteoarthritis of right hip 12/18/2017   Primary localized osteoarthritis of right knee 05/28/2018   S/P coronary artery stent placement 1995   in [facility]   05-21-2018 per pt x2  stents in same coronary artery (unsure BM or DES)   Vitamin D  deficiency disease      Significant Hospital Events: Including procedures, antibiotic start and stop dates in addition to other pertinent events     Interim History / Subjective:  Objective    Blood pressure (!) 121/34, pulse 81, temperature 98.5 F (36.9 C), temperature source Temporal, resp. rate 15, height 5' 7 (1.702 m), weight 57.3 kg, SpO2 100%.    Vent Mode: PRVC FiO2 (%):  [100 %] 100 % Set Rate:  [15 bmp] 15 bmp Vt Set:  [490 mL] 490 mL PEEP:  [5 cmH20] 5 cmH20 Plateau Pressure:  [21 cmH20] 21 cmH20   Intake/Output Summary (Last 24 hours) at 07/23/2024 1502 Last data filed at 07/23/2024 1359 Gross per 24 hour   Intake 3048.69 ml  Output --  Net 3048.69 ml   Filed Weights   07/17/24 1621 07/18/24 1540  Weight: 58.2 kg 57.3 kg    Examination: General: Acute on chronic cachectic older adult female, status post esophageal stent on vent HENT: Normocephalic, PERRL intact, poor dentition, Lungs: Diminished, expiratory wheeze, on vent Cardiovascular: S1, S2, RRR, no JVD, AICD in place Abdomen: Bowel sounds active, soft Extremities: Moves extremities to pain Neuro: RASS -2, intubated, sedated GU: Deferred  Resolved problem list   Assessment and Plan  Acute hypoxic respiratory failure in setting of aspiration status post esophageal stent placement Squamous cell carcinoma of right upper lobe lung with metastatic disease- medically managed by MD Onesimo  History of non-Hodgkin's lymphoma s/p R-CHOP (2024) Intubated on 1/14-history of difficult intubation P: Send tracheal aspirate Placed on Unasyn  Continue ventilator support and lung protective strategies  Continue LTVV  Wean PEEP and Fio2 requirements to sat goal of >92%  HOB > 30 degrees Plat < 30  Aim for Driving pressures < 15  Intermittent Chest X-ray and ABGS Obtain and follow culture-tracheal aspirate VAP and PAD protocols in place  Wean sedation as tolerated, SBT and WUA daily -continue propofol  and fentanyl  infusion Obtain ABG -Patient getting palliative esophageal stent in setting of metastatic disease, per report and also family-plan was to get esophageal stent placed and go home with hospice.  Currently was on durvalumab - but dc'd per oncology's note, plan for patient to go home with hospice  Once patient is suitable for extubation- recommend checking cuff leak   Esophageal malignant stenosis Dysphagia Severe protein malnutrition in setting of metastatic disease Original plan was to get feeding tube for comfort feedings but patient opted to not undergo PEG placement but opted to receive esophageal stent. P: Hold off tube  feedings at this time, hopefully extubate within the next 24 hours Will need RD to assist with protein malnutrition Continue aspiration precautions CBGs every 4  Acute on chronic systolic heart failure CAD Paroxysmal A-fib Hypertension HLD EF 50%, LV low normal function, regional wall abnormalities noted but unchanged, grade 1 diastolic dysfunction, RV systolic function normal, mild elevation in pulmonary artery systolic pressure, trivial MV regurgitation P: Continue cardiac telemetry Hold antihypertensives p.o. antihypertensives in setting of setting of esophageal stent placement As needed hydralazine  every 6hrs  Eliquis  and AC on hold per GI, hopefully can restart tomorrow Place SCDs Resume statin when able to tolerate p.o. meds  Thrombocytopenia -Suspect related to cancer Plts 118 P: Continue to trend CBC Monitor for signs of bleeding   Labs   CBC: Recent Labs  Lab 07/17/24 1430 07/18/24 0527 07/19/24 1035 07/20/24 0658 07/21/24 0445 07/23/24 0443  WBC 4.5 3.5* 3.8* 3.5* 3.4* 4.0  NEUTROABS 3.6  --   --   --   --  3.1  HGB 15.2* 13.6 12.9 13.0 12.8 13.5  HCT 43.7 40.1 39.1 39.8 39.0 40.8  MCV 89.0 92.8 93.1 93.4 94.7 94.2  PLT 148* 114* 106* 111* 116* 118*    Basic Metabolic Panel: Recent Labs  Lab 07/17/24 1430 07/18/24 0527 07/19/24 1035 07/20/24 0658 07/21/24 0445 07/23/24 0443  NA 137 141 139 140 140 139  K 3.6 3.6 4.0 4.6 4.1 4.0  CL 101 107 104 104 105 106  CO2 26 25 27 29 28 25   GLUCOSE 113* 85 92 94 104* 102*  BUN 8 7* 6* <5* 5* <5*  CREATININE 0.53 0.50 0.55 0.56 0.48 0.46  CALCIUM  9.8 9.0 9.3 9.9 9.6 9.4  MG 2.1  --   --   --   --   --   PHOS 3.2  --  3.2 3.3  --   --    GFR: Estimated Creatinine Clearance: 51.6 mL/min (by C-G formula based on SCr of 0.46 mg/dL). Recent Labs  Lab 07/19/24 1035 07/20/24 0658 07/21/24 0445 07/23/24 0443  WBC 3.8* 3.5* 3.4* 4.0    Liver Function Tests: Recent Labs  Lab 07/17/24 1430  07/23/24 0443  AST 24 18  ALT 15 10  ALKPHOS 96 87  BILITOT 1.1 0.9  PROT 6.8 5.9*  ALBUMIN  4.1 3.4*   No results for input(s): LIPASE, AMYLASE in the last 168 hours. No results for input(s): AMMONIA in the last 168 hours.  ABG No results found for: PHART, PCO2ART, PO2ART, HCO3, TCO2, ACIDBASEDEF, O2SAT   Coagulation Profile: Recent Labs  Lab 07/18/24 0527  INR 1.1    Cardiac Enzymes: No results for input(s): CKTOTAL, CKMB, CKMBINDEX, TROPONINI in the last 168 hours.  HbA1C: No results found for: HGBA1C  CBG: No results for input(s): GLUCAP in the last 168 hours.  Review of Systems:   See HPI  Past Medical History:  She,  has a past medical history of AICD (automatic cardioverter/defibrillator) present (dual), Anemia, Anticoagulant long-term use, Anxiety, Arthralgia of multiple joints, Arthritis pain, Benign hypertensive heart disease, CAD (coronary artery disease) (primary cardiologist-- dr danelle taylor), Cancer [facility]), Cataract (?), CHF NYHA class II, chronic, systolic (HCC), Complication of anesthesia, Degenerative disc disease, lumbar, Depression, Difficult intubation, Dyslipidemia, History of basal cell carcinoma (BCC) excision, History of DVT of lower extremity (yrs ago before 2012), History of MI (myocardial infarction) (1995  in [place]), History of pulmonary embolus (PE) (2012), History of ventricular tachycardia, Hyperlipidemia, Hypersomnia, Hypertension, Ischemic cardiomyopathy, Migraines, Myocardial infarction (HCC), OA (osteoarthritis), PAF (paroxysmal atrial fibrillation) (HCC), Pneumonia, Primary localized osteoarthritis of right hip (12/18/2017), Primary localized osteoarthritis of right knee (05/28/2018), S/P coronary artery stent placement (1995   in [place]), and Vitamin D  deficiency disease.   Surgical History:   Past Surgical History:  Procedure Laterality Date   BIOPSY  06/19/2022   Procedure: BIOPSY;  Surgeon: Elicia Claw,  MD;  Location: [facility] ENDOSCOPY;  Service: Gastroenterology;;   BRONCHIAL BIOPSY  08/07/2023   Procedure: BRONCHIAL BIOPSIES;  Surgeon: Shelah Lamar RAMAN, MD;  Location[facility] ENDOSCOPY;  Service: Pulmonary;;   BRONCHIAL BRUSHINGS  08/07/2023   Procedure: BRONCHIAL BRUSHINGS;  Surgeon: Shelah Lamar RAMAN, MD;  Location: [facility] ENDOSCOPY;  Service: Pulmonary;;   BRONCHIAL BRUSHINGS  03/17/2024   Procedure: BRONCHOSCOPY, WITH BRUSH BIOPSY;  Surgeon: Shelah Lamar RAMAN, MD;  Location: [facility] ENDOSCOPY;  Service: Pulmonary;;   BRONCHIAL NEEDLE ASPIRATION BIOPSY  08/07/2023   Procedure: BRONCHIAL NEEDLE ASPIRATION BIOPSIES;  Surgeon: Shelah Lamar RAMAN, MD;  Location: [facility] ENDOSCOPY;  Service: Pulmonary;;   BRONCHIAL NEEDLE ASPIRATION BIOPSY  03/17/2024   Procedure: BRONCHOSCOPY, WITH NEEDLE ASPIRATION BIOPSY;  Surgeon: Shelah Lamar RAMAN, MD;  Location:  [facility] ENDOSCOPY;  Service: Pulmonary;;   CARDIAC DEFIBRILLATOR PLACEMENT  2007   CARPAL TUNNEL RELEASE Right 02/26/2024   Procedure: CARPAL TUNNEL RELEASE;  Surgeon: Josefina Chew, MD;  Location: [facility] OR;  Service: Orthopedics;  Laterality: Right;   CATARACT EXTRACTION, BILATERAL     COLONOSCOPY  04/14/2013   colonic polyp, status post polypectomy. Mild panocolonic diverticulosis. Small internal hemorrhoids   CRYOTHERAPY  03/17/2024   Procedure: CRYOTHERAPY;  Surgeon: Shelah Lamar RAMAN, MD;  Location: [facility] ENDOSCOPY;  Service: Pulmonary;;   DILATION AND CURETTAGE OF UTERUS  yrs ago   DIRECT LARYNGOSCOPY Right 10/04/2022   Procedure: DIRECT LARYNGOSCOPY;  Surgeon: Llewellyn Gerard LABOR, DO;  Location: [facility] OR;  Service: ENT;  Laterality: Right;   ENDOBRONCHIAL ULTRASOUND Bilateral 03/17/2024   Procedure: ENDOBRONCHIAL ULTRASOUND (EBUS);  Surgeon: Shelah Lamar RAMAN, MD;  Location: [facility] ENDOSCOPY;  Service: Pulmonary;  Laterality: Bilateral;   ESOPHAGOGASTRODUODENOSCOPY N/A 07/19/2024   Procedure: EGD (ESOPHAGOGASTRODUODENOSCOPY);  Surgeon: Elicia Claw, MD;  Location: [facility];  Service:  Gastroenterology;  Laterality: N/A;   ESOPHAGOGASTRODUODENOSCOPY (EGD) WITH PROPOFOL  N/A 06/19/2022   Procedure: ESOPHAGOGASTRODUODENOSCOPY (EGD) WITH PROPOFOL ;  Surgeon: Elicia Claw, MD;  Location: [facility] ENDOS[facility];  Service: Gastroenterology;  Laterality: N/A;   EYE SURGERY  2020   Cataracts removed   FIDUCIAL MARKER PLACEMENT  08/07/2023   Procedure: FIDUCIAL MARKER PLACEMENT;  Surgeon: Shelah Lamar RAMAN, MD;  Location: [facility] ENDOSCOPY;  Service: Pulmonary;;   FOREIGN BODY REMOVAL  07/19/2024   Procedure: REMOVAL, FOREIGN BODY;  Surgeon: Elicia Claw, MD;  Location: [facility] ENDOSCOPY;  Service: Gastroenterology;;   ICD GENERATOR REMOVAL N/A 03/14/2024   Procedure: ICD GENERATOR REMOVAL;  Surgeon: Waddell Danelle ORN, MD;  [facility]: [facility] INVASIVE CV LAB;  Service: Cardiovascular;  Laterality: N/A;   IMPACTION REMOVAL  06/19/2022   Procedure: IMPACTION REMOVAL;  Surgeon: Elicia Claw, MD;  Location: [facility] ENDOSCOPY;  Service: Gastroenterology;;   IMPLANTABLE CARDIOVERTER DEFIBRILLATOR GENERATOR CHANGE N/A 02/04/2013   Procedure: IMPLANTABLE CARDIOVERTER DEFIBRILLATOR GENERATOR CHANGE;  Surgeon: Danelle ORN Waddell, MD;  Location: [facility] CATH LAB;  Service: Cardiovascular;  Laterality: N/A;   IR IMAGING GUIDED PORT INSERTION  10/27/2022   JOINT REPLACEMENT  2019, 2022, 2023   Both hips, both knees   LYMPH NODE BIOPSY Right 10/04/2022   Procedure: EXCISIONAL OF RIGHT DEEP CERVICAL LYMPH NODE;  Surgeon: Llewellyn Gerard LABOR, DO;  Location: [facility];  Service: ENT;  Laterality: Right;   SHOULDER ARTHROSCOPY Right 2015   TOTAL HIP ARTHROPLASTY Right 12/18/2017   Procedure: RIGHT TOTAL HIP ARTHROPLASTY;  Surgeon: Josefina Chew, MD;  Location: [facility] OR;  Service: Orthopedics;  Laterality: Right;   TOTAL HIP ARTHROPLASTY Left 04/19/2021   Procedure: TOTAL HIP ARTHROPLASTY;  Surgeon: Josefina Chew, MD;  Location: [facility] ORS;  Service: Orthopedics;  Laterality: Left;   TOTAL KNEE ARTHROPLASTY Right 05/28/2018   Procedure:  TOTAL KNEE ARTHROPLASTY;  Surgeon: Josefina Chew, MD;  Location: [facility] ORS;  Service: Orthopedics;  Laterality: Right;  Adductor Block   TOTAL KNEE ARTHROPLASTY Left 08/12/2021   Procedure: TOTAL KNEE ARTHROPLASTY;  Surgeon: Josefina Chew, MD;  Location: [facility] ORS;  Service: Orthopedics;  Laterality: Left;   TUBAL LIGATION Bilateral yrs ago   VIDEO BRONCHOSCOPY WITH ENDOBRONCHIAL NAVIGATION Bilateral 03/17/2024   Procedure: VIDEO BRONCHOSCOPY WITH ENDOBRONCHIAL NAVIGATION;  Surgeon: Shelah Lamar RAMAN, MD;  Location: [facility] ENDOSCOPY;  Service: Pulmonary;  Laterality: Bilateral;  need EBUS scope also   VIDEO BRONCHOSCOPY WITH RADIAL ENDOBRONCHIAL ULTRASOUND  08/07/2023   Procedure: VIDEO BRONCHOSCOPY WITH RADIAL ENDOBRONCHIAL ULTRASOUND;  Surgeon: Shelah Lamar RAMAN, MD;  Location: [facility] ENDOSCOPY;  Service: Pulmonary;;   WISDOM TOOTH EXTRACTION       Social History:   reports that she quit smoking about 28 years ago. Her smoking use included cigarettes. She started smoking about 58 years ago. She has never used smokeless tobacco. She reports that she does not drink alcohol and does not use drugs.   Family History:  Her family history includes Alzheimer's disease in her mother; Arthritis in her father; Cancer in her sister; Congestive Heart Failure in her maternal grandmother; Coronary artery disease in her father; Dementia in her paternal grandmother; Hearing loss in her mother; Heart attack in her maternal grandfather; Heart disease in her father; Hypertension in her maternal grandmother and mother; Other in her maternal grandfather; Other (age of onset: 41) in her paternal grandfather; Pulmonary embolism in her father; Thyroid  disease in her mother.   Allergies Allergies[1]   Home Medications  Prior to Admission medications  Medication Sig Start Date End Date Taking? Authorizing Provider  apixaban  (ELIQUIS ) 5 MG TABS tablet Take 1 tablet (5 mg total) by mouth 2 (two) times daily. 04/14/24  Yes Waddell Danelle ORN,  MD  carvedilol  (COREG ) 12.5 MG tablet Take 12.5 mg by mouth 2 (two) times daily with a meal.    Yes [provider]  cyanocobalamin  (VITAMIN B12) 1000 MCG tablet Take 1,000 mcg by mouth daily.   Yes [provider]  cyclobenzaprine (FLEXERIL) 5 MG tablet Take 5 mg by mouth 3 (three) times daily as needed. 05/27/24  Yes [provider]  gabapentin  (NEURONTIN ) 300 MG capsule Take 300 mg by mouth 3 (three) times daily.   Yes [provider]  loratadine  (CLARITIN ) 10 MG tablet Take 10 mg by mouth daily.   Yes [provider]  ramipril  (ALTACE ) 2.5 MG tablet Take 2.5 mg by mouth daily.     Yes [provider]  simvastatin  (ZOCOR ) 40 MG tablet Take 40 mg by mouth daily.   Yes [provider]  traZODone  (DESYREL ) 50 MG tablet Take 50 mg by mouth at bedtime.    Yes [provider]  zonisamide  (ZONEGRAN ) 50 MG capsule Take 50-100 mg by mouth See admin instructions. Take 50mg  by mouth in the morning and 100mg  at night. 09/13/17  Yes [provider]  dexamethasone  (DECADRON ) 4 MG tablet Take 2 tabs by mouth 2 times daily starting day before chemo. Then take 2 tabs daily for 2 days starting day after chemo. Take with food. 07/23/24   Onesimo Emaline Brink, MD  HYDROcodone -acetaminophen  (NORCO/VICODIN) 5-325 MG tablet Take 1 tablet by mouth every 6 (six) hours as needed for moderate pain (pain score 4-6). Patient not taking: Reported on 07/17/2024 06/04/24   Kale, Gautam Kishore, MD  ipratropium (ATROVENT  HFA) 17 MCG/ACT inhaler Inhale 2 puffs into the lungs every 6 (six) hours as needed for wheezing. Patient not taking: Reported on 07/17/2024 04/25/24   Onesimo Emaline Brink, MD  lidocaine -prilocaine  (EMLA ) cream Apply to affected area once 07/23/24   Onesimo Emaline Brink, MD  ondansetron  (ZOFRAN ) 8 MG tablet Take 1 tablet (8 mg total) by mouth every 8 (eight) hours as needed for nausea or vomiting. 07/23/24   Onesimo Emaline Brink, MD   prochlorperazine  (COMPAZINE ) 10 MG tablet Take 1 tablet (10 mg total) by mouth every 6 (six) hours as needed for nausea or vomiting. 07/23/24   Onesimo Emaline Brink, MD     Critical care time: 55 mins  Sherlean Sharps AGACNP-BC   [facility] 07/23/2024, 3:27 PM  Please see [email or web address] for pager details.  From 7A-7P if no response, please call [phone]. After hours, please call ELink [phone].            [1]  Allergies Allergen Reactions   Sumatriptan Succinate Other (See Comments)    Chest pain, no triptans, pt states it makes my heart race   "

## 2024-07-23 NOTE — Progress Notes (Signed)
 Rachel Bates   DOB:[birth year]   FM#:[id number]      CLINICAL SUMMARY:  Rachel Bates  is a 79 year old female patient admitted on 07/17/2024 with complaints of difficulty swallowing with weight loss.  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  Medical oncology following.   Oncologic history: Patient diagnosed with lung cancer. - Status post concurrent chemoradiation with carbo + Taxol  from 09/18/2023 to 10/16/2023. - Status post radiation therapy - Started on immunotherapy with Durvalumab  09/18/2023, this has been discontinued. - Planned to proceed with dual immune checkpoint inhibitor therapy Ipi/Nivolumab.  However goals of care discussion initiated and patient has agreed to go on home hospice.   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  Plan now is for esophageal stent. - Goals of care have been discussed with patient and she has agreed to proceed with home hospice. - Imaging 07/17/2024 shows progression of disease. -Medical oncology Dr. Onesimo following closely.   History of DLBCL, at least stage IIIa - Diagnosed 10/04/2022.  Status post chemotherapy R-CEOP   Chronic thrombocytopenia -Platelets 118K - No intervention required. - Monitor for bleeding    Code Status Full  Subjective:  Patient seen awake and alert, being taken via wheelchair for esophageal stent.  States that she wants to proceed with hospice in [place].  However would like to continue seeing Dr. Onesimo at [facility].  Objective:   Intake/Output Summary (Last 24 hours) at 07/23/2024 1143 Last data filed at 07/23/2024 1001 Gross per 24 hour  Intake 2339.88 ml  Output --  Net 2339.88 ml     PHYSICAL EXAMINATION: ECOG PERFORMANCE STATUS: 3 - Symptomatic,  >50% confined to bed  Vitals:   07/23/24 0606 07/23/24 1118  BP: (!) 149/80 (!) 162/80  Pulse: 90 91  Resp: 18 20  Temp: 98.5 F (36.9 C) 98.5 F (36.9 C)  SpO2: 93% 95%   Filed Weights   07/17/24 1621 07/18/24 1540  Weight: 128 lb 6.4 oz (58.2 kg) 126 lb 4.8 oz (57.3 kg)    GENERAL: alert, no distress and comfortable + chronically ill-appearing SKIN: + Pale skin color, texture, turgor are normal, no rashes or significant lesions EYES: normal, conjunctiva are pink and non-injected, sclera clear OROPHARYNX: no exudate, no erythema and lips, buccal mucosa, and tongue normal  NECK: supple, thyroid  normal size, non-tender, without nodularity LYMPH: no palpable lymphadenopathy in the cervical, axillary or inguinal LUNGS: clear to auscultation and percussion with normal breathing effort HEART: regular rate & rhythm and no murmurs and no lower extremity edema ABDOMEN: abdomen soft, non-tender and normal bowel sounds MUSCULOSKELETAL: no cyanosis of digits and no clubbing  PSYCH: alert & oriented x 3 with fluent speech NEURO: no focal motor/sensory deficits   All questions were answered. The patient knows to call the clinic with any problems, questions or concerns.   I personally spent a total of 40 minutes minutes in the care of the patient today including preparing to see the patient, performing a medically appropriate exam/evaluation, counseling and educating, referring and communicating with other health care professionals, documenting clinical information in the EHR, communicating results, and coordinating care.    Olam PARAS Nonna Renninger, NP 07/23/2024 11:43 AM    Labs Reviewed:  Lab Results  Component Value Date   WBC 4.0 07/23/2024  HGB 13.5 07/23/2024   HCT 40.8 07/23/2024   MCV 94.2 07/23/2024   PLT 118 (L) 07/23/2024   Recent Labs    06/04/24 1143 07/17/24 1430 07/18/24 0527 07/20/24 0658 07/21/24 0445 07/23/24 0443  NA 138 137   < > 140 140 139  K 4.0 3.6   < > 4.6  4.1 4.0  CL 103 101   < > 104 105 106  CO2 28 26   < > 29 28 25   GLUCOSE 101* 113*   < > 94 104* 102*  BUN 15 8   < > <5* 5* <5*  CREATININE 0.62 0.53   < > 0.56 0.48 0.46  CALCIUM  9.7 9.8   < > 9.9 9.6 9.4  GFRNONAA >60 >60   < > >60 >60 >60  PROT 6.9 6.8  --   --   --  5.9*  ALBUMIN  4.2 4.1  --   --   --  3.4*  AST 23 24  --   --   --  18  ALT 17 15  --   --   --  10  ALKPHOS 96 96  --   --   --  87  BILITOT 0.4 1.1  --   --   --  0.9   < > = values in this interval not displayed.    Studies Reviewed:   CT ABDOMEN WO CONTRAST Result Date: 07/22/2024 CLINICAL DATA:  assess anatomy for gastrostomy tube placement Pre gastric tube placement. Barium swallow : Abrupt narrowing in mid esophagus likely secondary to left-sided paraesophageal mass based on CAT scan 2.4 x 2.3 cm(history of lymphoma and squamous cell carcinoma of right lung EXAM: CT ABDOMEN WITHOUT CONTRAST TECHNIQUE: Multidetector CT imaging of the abdomen was performed following the standard protocol without IV contrast. RADIATION DOSE REDUCTION: This exam was performed according to the departmental dose-optimization program which includes automated exposure control, adjustment of the mA and/or kV according to patient size and/or use of iterative reconstruction technique. COMPARISON:  CTA chest and CT AP, 07/17/2024. FINDINGS: Lower chest: No acute abnormality. Anterior mediastinal mass and bibasilar pulmonary nodularities. Hepatobiliary: Multifocal hepatic hypodensities. No gallstones, gallbladder wall thickening, or biliary dilatation. Pancreas: No pancreatic ductal dilatation or surrounding inflammatory changes. Spleen: Normal in size without focal abnormality. Adrenals/Urinary Tract: Adrenal glands are unremarkable. Kidneys are normal, without renal calculi, focal lesion, or hydronephrosis. Stomach/Bowel: Intraluminal opacification of stomach and imaged small bowel, likely previously ingested. Stomach is within normal limits.  Appendix appears normal. No evidence of bowel wall thickening, distention, or inflammatory changes. Vascular/Lymphatic: Aortic atherosclerosis. No enlarged abdominal lymph nodes. Other: Mild scaphoid abdomen. No abdominal wall hernia or abnormality. Musculoskeletal: RIGHT seventh rib expansile lesion. No interval osseous abnormality. IMPRESSION: 1. Mild prominence of the LEFT hepatic lobe. Anatomy is otherwise amenable for percutaneous gastrostomy. 2. No acute abdominal findings 3. Multiple findings within the imaged chest and abdomen, better appreciated and described on recent comparison contrasted CT CAP, consistent with metastatic disease. Electronically Signed   By: Thom Hall M.D.   On: 07/22/2024 07:03   DG ESOPHAGUS W SINGLE CM (SOL OR THIN BA) Result Date: 07/21/2024 CLINICAL DATA:  Dysphagia, history of esophageal stricture. EGD completed yesterday, unable to advance endoscope past 30 cm distally in esophagus. GI suspecting extrinsic esophageal compression. Request for esophagram for further evaluation. EXAM: ESOPHAGUS/BARIUM SWALLOW/TABLET STUDY TECHNIQUE: Single contrast examination was performed using thin liquid barium. This exam was performed by Kimble Clas, PA-C, and was supervised and  interpreted by Dr. MARLA Kilts. FLUOROSCOPY: Radiation Exposure Index (as provided by the fluoroscopic device): 13.70 mGy Kerma COMPARISON:  None Available. FINDINGS: Exam limited secondary to poor PO intake ability. Esophagus: Marked area of esophageal narrowing in the midthoracic esophagus with slowing of barium passage. Appears to be in similar location to area described in recent EGD, roughly 30 cm distal. Esophageal motility: Significant esophageal dysmotility with lying prone. Mild dysmotility with standing upright. Gastroesophageal reflux: None visualized. Other: None. IMPRESSION: Limited exam secondary to patient unable to drink consecutive swallows. Abrupt narrowing of the mid esophageal contrast column  with partial obstruction. When correlated with the CT of 07/17/2024, this is likely secondary to a left-sided paraesophageal mass (most likely nodal) including on image 82 of series 6. This mass is estimated at 2.4 x 2.3 cm. Given EMR history of lymphoma and squamous cell carcinoma of the right lung, this presumably represents metastatic disease or lymphomatous involvement. Esophageal dysmotility, likely presbyesophagus. Electronically Signed   By: Rockey Kilts M.D.   On: 07/21/2024 11:03   CT ABDOMEN PELVIS W CONTRAST Result Date: 07/17/2024 EXAM: CTA CHEST PE WITH AND WITHOUT CONTRAST CT ABDOMEN AND PELVIS WITH CONTRAST 07/17/2024 06:06:12 PM TECHNIQUE: CTA of the chest was performed after the administration of 100 mL of iohexol  (OMNIPAQUE ) 350 MG/ML injection. Multiplanar reformatted images are provided for review. MIP images are provided for review. CT of the abdomen and pelvis was performed with the administration of intravenous contrast. Automated exposure control, iterative reconstruction, and/or weight based adjustment of the mA/kV was utilized to reduce the radiation dose to as low as reasonably achievable. COMPARISON: CT chest abdomen and pelvis 05/27/2024. CLINICAL HISTORY: Abdominal pain, nausea, vomiting, cancer, weight loss. Pulmonary embolus suspected with high probability. Feels like something in the chest. Difficulty swallowing. FINDINGS: CHEST: PULMONARY ARTERIES: Pulmonary arteries are adequately opacified for evaluation. No intraluminal filling defect to suggest pulmonary embolism. Main pulmonary artery is normal in caliber. MEDIASTINUM: Right central venous catheter with tip in the SVC. Normal heart size. No pericardial effusions. Air-fluid level and debris in the esophagus with esophageal dilatation down to the level of about T8 with distal decompression of the esophagus. This is concerning for esophageal obstruction, possibly due to mass, stricture, or extrinsic compression. Right  supraclavicular lymphadenopathy, today measuring 2.6 cm in diameter. Right paratracheal lymphadenopathy measuring up to 1.2 cm short axis. Developing right hilar mass or lymphadenopathy measuring 2.4 cm diameter. There is no acute abnormality of the thoracic aorta. LUNGS AND PLEURA: Consolidation in the right middle lung likely representing postobstructive change. Pneumonia would be a less likely consideration. Multiple pulmonary nodules are demonstrated throughout the lungs. Largest are in the right lung bases measuring up to 1.5 x 0.9 cm. Pulmonary nodularity is new since the prior study. 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 No pleural effusion or pneumothorax. SOFT TISSUES AND BONES: Degenerative changes in the spine. Expansile soft tissue lesion involving the right anterior 7th rib, enlarging since prior study, consistent with bone metastasis. ABDOMEN AND PELVIS: LIVER: Diffuse fatty infiltration of the liver. Multiple heterogeneous hypoenhancing liver lesions consistent with metastatic disease. The largest is in segment 5 measuring 3 cm diameter. This is progressing since the previous study. GALLBLADDER AND BILE DUCTS: Gallbladder is unremarkable. No biliary ductal dilatation. SPLEEN: Spleen demonstrates no acute abnormality. PANCREAS: Pancreas demonstrates no acute abnormality. ADRENAL GLANDS: Adrenal glands demonstrate no acute abnormality. KIDNEYS, URETERS AND BLADDER: No stones in the kidneys  or ureters. No hydronephrosis. No perinephric or periureteral stranding. The bladder is obscured by streak artifact from hip arthroplasties. GI AND BOWEL: Stomach and duodenal sweep demonstrate no acute abnormality. There is no bowel obstruction. No abnormal bowel wall thickening or distension. Stool throughout the colon. The appendix is normal. REPRODUCTIVE: The uterus and ovaries are not enlarged. PERITONEUM AND  RETROPERITONEUM: No ascites or free air. LYMPH NODES: No retroperitoneal lymphadenopathy. 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 Streak artifact from the arthroplasties limits visualization of the low pelvis. Degenerative changes in the lumbar spine with mild lumbar scoliosis convex towards the right. Calcification of the abdominal aorta. No focal soft tissue abnormality. IMPRESSION: 1. No pulmonary embolism. 2. Esophageal obstruction with air-fluid level and debris, possibly due to mass, stricture, or extrinsic compression. 3. Right supraclavicular, mediastinal, and right hilar lymphadenopathy. , concerning for malignancy. 4. Right hilar mass with right middle lobe consolidation, likely postobstructive change. 5. 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 6. Multiple heterogeneous hypoenhancing liver lesions consistent with metastatic disease, progressing since the previous study. 7. Expansile soft tissue lesion involving the right anterior 7th rib, enlarging since prior study, consistent with bone metastasis. Electronically signed by: Elsie Gravely MD 07/17/2024 06:24 PM EST RP Workstation: [id number]   CT Angio Chest PE W and/or Wo Contrast Result Date: 07/17/2024 EXAM: CTA CHEST PE WITH AND WITHOUT CONTRAST CT ABDOMEN AND PELVIS WITH CONTRAST 07/17/2024 06:06:12 PM TECHNIQUE: CTA of the chest was performed after the administration of 100 mL of iohexol  (OMNIPAQUE ) 350 MG/ML injection. Multiplanar reformatted images are provided for review. MIP images are provided for review. CT of the abdomen and pelvis was performed with the administration of intravenous contrast. Automated exposure control, iterative reconstruction, and/or weight based adjustment of the mA/kV was utilized to reduce the radiation dose to as low as reasonably achievable. COMPARISON: CT chest abdomen and pelvis 05/27/2024. CLINICAL  HISTORY: Abdominal pain, nausea, vomiting, cancer, weight loss. Pulmonary embolus suspected with high probability. Feels like something in the chest. Difficulty swallowing. FINDINGS: CHEST: PULMONARY ARTERIES: Pulmonary arteries are adequately opacified for evaluation. No intraluminal filling defect to suggest pulmonary embolism. Main pulmonary artery is normal in caliber. MEDIASTINUM: Right central venous catheter with tip in the SVC. Normal heart size. No pericardial effusions. Air-fluid level and debris in the esophagus with esophageal dilatation down to the level of about T8 with distal decompression of the esophagus. This is concerning for esophageal obstruction, possibly due to mass, stricture, or extrinsic compression. Right supraclavicular lymphadenopathy, today measuring 2.6 cm in diameter. Right paratracheal lymphadenopathy measuring up to 1.2 cm short axis. Developing right hilar mass or lymphadenopathy measuring 2.4 cm diameter. There is no acute abnormality of the thoracic aorta. LUNGS AND PLEURA: Consolidation in the right middle lung likely representing postobstructive change. Pneumonia would be a less likely consideration. Multiple pulmonary nodules are demonstrated throughout the lungs. Largest are in the right lung bases measuring up to 1.5 x 0.9 cm. Pulmonary nodularity is new since the prior study. 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 No pleural effusion or pneumothorax. SOFT TISSUES AND BONES: Degenerative changes in the spine. Expansile soft tissue lesion involving the right anterior 7th rib, enlarging since prior study, consistent with bone metastasis. ABDOMEN AND PELVIS: LIVER: Diffuse fatty infiltration of the liver. Multiple heterogeneous hypoenhancing liver lesions consistent with metastatic disease. The largest is in segment 5 measuring 3  cm diameter. This is progressing since the previous  study. GALLBLADDER AND BILE DUCTS: Gallbladder is unremarkable. No biliary ductal dilatation. SPLEEN: Spleen demonstrates no acute abnormality. PANCREAS: Pancreas demonstrates no acute abnormality. ADRENAL GLANDS: Adrenal glands demonstrate no acute abnormality. KIDNEYS, URETERS AND BLADDER: No stones in the kidneys or ureters. No hydronephrosis. No perinephric or periureteral stranding. The bladder is obscured by streak artifact from hip arthroplasties. GI AND BOWEL: Stomach and duodenal sweep demonstrate no acute abnormality. There is no bowel obstruction. No abnormal bowel wall thickening or distension. Stool throughout the colon. The appendix is normal. REPRODUCTIVE: The uterus and ovaries are not enlarged. PERITONEUM AND RETROPERITONEUM: No ascites or free air. LYMPH NODES: No retroperitoneal lymphadenopathy. 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 Streak artifact from the arthroplasties limits visualization of the low pelvis. Degenerative changes in the lumbar spine with mild lumbar scoliosis convex towards the right. Calcification of the abdominal aorta. No focal soft tissue abnormality. IMPRESSION: 1. No pulmonary embolism. 2. Esophageal obstruction with air-fluid level and debris, possibly due to mass, stricture, or extrinsic compression. 3. Right supraclavicular, mediastinal, and right hilar lymphadenopathy. , concerning for malignancy. 4. Right hilar mass with right middle lobe consolidation, likely postobstructive change. 5. 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 6. Multiple heterogeneous hypoenhancing liver lesions consistent with metastatic disease, progressing since the previous study. 7. Expansile soft tissue lesion involving the right anterior 7th rib, enlarging since prior study, consistent with bone metastasis. Electronically signed by: Elsie Gravely MD 07/17/2024 06:24 PM EST RP  Workstation: [id number]

## 2024-07-23 NOTE — Progress Notes (Signed)
 Rachel Bates 11:29 AM  Subjective: Patient seen and examined and case discussed with my partner Dr. Saintclair and her hospital computer chart reviewed and her case discussed with her daughter as well and we answered all of their questions and discussed the procedure and she stopped her Eliquis  on Monday  Objective: Vital signs stable afebrile no acute distress exam please see preassessment evaluation previous endoscopy CT and barium swallow reviewed labs okay  Assessment: Extrinsic compression of the esophagus  Plan: Risk benefits and methods of endoscopic stent placement was discussed and we will proceed today with anesthesia assistance with further workup and plans pending those findings  [facility]  office [phone] After 5PM or if no answer call [phone]

## 2024-07-23 NOTE — Anesthesia Postprocedure Evaluation (Addendum)
"   Anesthesia Post Note  Patient: Rachel Bates  Procedure(s) Performed: EGD (ESOPHAGOGASTRODUODENOSCOPY) EGD, WITH DILATION USING SAVARY-GILLIARD DILATOR OVER GUIDEWIRE INSERTION, STENT, ESOPHAGUS FOREIGN BODY RETRIEVAL     Patient location during evaluation: PACU Anesthesia Type: Spinal and General Level of consciousness: patient remains intubated per anesthesia plan and responds to stimulation Pain management: pain level controlled Vital Signs Assessment: post-procedure vital signs reviewed and stable Respiratory status: patient remains intubated per anesthesia plan Cardiovascular status: blood pressure returned to baseline and stable Postop Assessment: no apparent nausea or vomiting Anesthetic complications: no Comments: See anesthesia record for event note   No notable events documented.  Last Vitals:  Vitals:   07/23/24 0606 07/23/24 1118  BP: (!) 149/80 (!) 162/80  Pulse: 90 91  Resp: 18 20  Temp: 36.9 C 36.9 C  SpO2: 93% 95%    Last Pain:  Vitals:   07/23/24 1118  TempSrc: Temporal  PainSc: 6                  Rachel Bates      "

## 2024-07-23 NOTE — Progress Notes (Signed)
 DISCONTINUE ON PATHWAY REGIMEN - Non-Small Cell Lung     A cycle is every 14 days:     Durvalumab    **Always confirm dose/schedule in your pharmacy ordering system**  PRIOR TREATMENT: [id number]: Durvalumab  10 mg/kg q14 Days x up to 12 Months  START ON PATHWAY REGIMEN - Non-Small Cell Lung     A cycle is every 21 days:     Ramucirumab      Docetaxel   **Always confirm dose/schedule in your pharmacy ordering system**  Patient Characteristics: Stage IV Metastatic, Squamous, Molecular Analysis Completed, Molecular Alteration Present and Targeted Therapy Exhausted, OR EGFR/KRAS/HER2/NRG1 Present and Standard Chemotherapy/Immunotherapy Planned, OR No Alteration Present, PS = 0, 1, Second Line -  Chemotherapy/Immunotherapy, Prior PD-1/PD-L1 Inhibitor + Chemotherapy or No Prior PD-1/PD-L1 Inhibitor, and Not a Candidate for Immunotherapy Therapeutic Status: Stage IV Metastatic Histology: Squamous Cell Molecular Analysis Results: No Alteration Present Chemotherapy/Immunotherapy Line of Therapy: Second Line Chemotherapy/Immunotherapy ECOG Performance Status: 1 Prior Immunotherapy Status: Prior PD-1/PD-L1 Inhibitor + Chemotherapy Immunotherapy Candidate Status: Not a Candidate for Immunotherapy Intent of Therapy: Non-Curative / Palliative Intent, Discussed with Patient

## 2024-07-23 NOTE — Progress Notes (Addendum)
 Unfortunately patient aspirated before we could clean out her debris above the stricture and was hypoxic for a while with sats about 70 and anesthesia elected to intubate her and we were able to finish the procedure however they have recommended a slow weaning and may be not try to intubate until tomorrow based on increased swelling and a difficult intubation and lots of suctioning try to clear the secretions and please see op note for details and anesthesia note for details and I have brought the daughter up to date and explained the above and antibiotics and other medicines per ICU team

## 2024-07-23 NOTE — Anesthesia Procedure Notes (Signed)
 Procedure Name: Intubation Date/Time: 07/23/2024 12:18 PM  Performed by: Carleton Garnette SAUNDERS, CRNAPre-anesthesia Checklist: Patient identified, Emergency Drugs available, Suction available, Patient being monitored and Timeout performed Patient Re-evaluated:Patient Re-evaluated prior to induction Oxygen Delivery Method: Circle system utilized Preoxygenation: Pre-oxygenation with 100% oxygen Induction Type: IV induction, Rapid sequence and Cricoid Pressure applied Laryngoscope Size: Mac, 3 and Glidescope Grade View: Grade I Tube type: Oral Tube size: 7.0 mm Number of attempts: 1 Airway Equipment and Method: Stylet Placement Confirmation: ETT inserted through vocal cords under direct vision, positive ETCO2 and breath sounds checked- equal and bilateral Secured at: 21 cm Tube secured with: Tape Dental Injury: Teeth and Oropharynx as per pre-operative assessment  Difficulty Due To: Difficulty was anticipated

## 2024-07-23 NOTE — Progress Notes (Signed)
 Subjective: Discussed about esophageal stent placement for dysphagia due to extrinsic compression from metastatic lung cancer with the patient and her daughter in details at bedside.  Objective: Vital signs in last 24 hours: Temp:  [98.1 F (36.7 C)-99.6 F (37.6 C)] 98.5 F (36.9 C) (01/14 0606) Pulse Rate:  [75-90] 90 (01/14 0606) Resp:  [18-20] 18 (01/14 0606) BP: (120-152)/(63-80) 149/80 (01/14 0606) SpO2:  [93 %-96 %] 93 % (01/14 0606) Weight change:  Last BM Date : 07/19/24  PE: Not in distress GENERAL: No pallor, nonicteric  ABDOMEN:Nondistended EXTREMITIES: No deformity  Lab Results: Results for orders placed or performed during the hospital encounter of 07/17/24 (from the past 48 hours)  CBC with Differential/Platelet     Status: Abnormal   Collection Time: 07/23/24  4:43 AM  Result Value Ref Range   WBC 4.0 4.0 - 10.5 K/uL   RBC 4.33 3.87 - 5.11 MIL/uL   Hemoglobin 13.5 12.0 - 15.0 g/dL   HCT 59.1 63.9 - 53.9 %   MCV 94.2 80.0 - 100.0 fL   MCH 31.2 26.0 - 34.0 pg   MCHC 33.1 30.0 - 36.0 g/dL   RDW 86.2 88.4 - 84.4 %   Platelets 118 (L) 150 - 400 K/uL   nRBC 0.0 0.0 - 0.2 %   Neutrophils Relative % 78 %   Neutro Abs 3.1 1.7 - 7.7 K/uL   Lymphocytes Relative 10 %   Lymphs Abs 0.4 (L) 0.7 - 4.0 K/uL   Monocytes Relative 11 %   Monocytes Absolute 0.5 0.1 - 1.0 K/uL   Eosinophils Relative 1 %   Eosinophils Absolute 0.1 0.0 - 0.5 K/uL   Basophils Relative 0 %   Basophils Absolute 0.0 0.0 - 0.1 K/uL   Immature Granulocytes 0 %   Abs Immature Granulocytes 0.01 0.00 - 0.07 K/uL    Comment: Performed at [facility], [address] [place], [place] [address]  Comprehensive metabolic panel with GFR     Status: Abnormal   Collection Time: 07/23/24  4:43 AM  Result Value Ref Range   Sodium 139 135 - 145 mmol/L   Potassium 4.0 3.5 - 5.1 mmol/L   Chloride 106 98 - 111 mmol/L   CO2 25 22 - 32 mmol/L   Glucose, Bld 102 (H) 70 - 99 mg/dL     Comment: Glucose reference range applies only to samples taken after fasting for at least 8 hours.   BUN <5 (L) 8 - 23 mg/dL   Creatinine, Ser 9.53 0.44 - 1.00 mg/dL   Calcium  9.4 8.9 - 10.3 mg/dL   Total Protein 5.9 (L) 6.5 - 8.1 g/dL   Albumin  3.4 (L) 3.5 - 5.0 g/dL   AST 18 15 - 41 U/L   ALT 10 0 - 44 U/L   Alkaline Phosphatase 87 38 - 126 U/L   Total Bilirubin 0.9 0.0 - 1.2 mg/dL   GFR, Estimated >39 >39 mL/min    Comment: (NOTE) Calculated using the CKD-EPI Creatinine Equation (2021)    Anion gap 8 5 - 15    Comment: Performed at [facility], [address] [place], [place] [address]    Studies/Results: CT ABDOMEN WO CONTRAST Result Date: 07/22/2024 CLINICAL DATA:  assess anatomy for gastrostomy tube placement Pre gastric tube placement. Barium swallow : Abrupt narrowing in mid esophagus likely secondary to left-sided paraesophageal mass based on CAT scan 2.4 x 2.3 cm(history of lymphoma and squamous cell carcinoma of right lung EXAM: CT  ABDOMEN WITHOUT CONTRAST TECHNIQUE: Multidetector CT imaging of the abdomen was performed following the standard protocol without IV contrast. RADIATION DOSE REDUCTION: This exam was performed according to the departmental dose-optimization program which includes automated exposure control, adjustment of the mA and/or kV according to patient size and/or use of iterative reconstruction technique. COMPARISON:  CTA chest and CT AP, 07/17/2024. FINDINGS: Lower chest: No acute abnormality. Anterior mediastinal mass and bibasilar pulmonary nodularities. Hepatobiliary: Multifocal hepatic hypodensities. No gallstones, gallbladder wall thickening, or biliary dilatation. Pancreas: No pancreatic ductal dilatation or surrounding inflammatory changes. Spleen: Normal in size without focal abnormality. Adrenals/Urinary Tract: Adrenal glands are unremarkable. Kidneys are normal, without renal calculi, focal lesion, or hydronephrosis. Stomach/Bowel:  Intraluminal opacification of stomach and imaged small bowel, likely previously ingested. Stomach is within normal limits. Appendix appears normal. No evidence of bowel wall thickening, distention, or inflammatory changes. Vascular/Lymphatic: Aortic atherosclerosis. No enlarged abdominal lymph nodes. Other: Mild scaphoid abdomen. No abdominal wall hernia or abnormality. Musculoskeletal: RIGHT seventh rib expansile lesion. No interval osseous abnormality. IMPRESSION: 1. Mild prominence of the LEFT hepatic lobe. Anatomy is otherwise amenable for percutaneous gastrostomy. 2. No acute abdominal findings 3. Multiple findings within the imaged chest and abdomen, better appreciated and described on recent comparison contrasted CT CAP, consistent with metastatic disease. Electronically Signed   By: Thom Hall M.D.   On: 07/22/2024 07:03    Medications: I have reviewed the patient's current medications.  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  Patient and her daughter have decided not to proceed with any further treatment including chemotherapy or radiation.  They are requesting for hospice evaluation.  They are agreeable with proceeding with EGD with esophageal stent placement for palliation.  The risks and the benefits of the procedure were discussed with the patient and her daughter in details at bedside.  Since patient is n.p.o. and Eliquis  is on hold and we have anesthesia availability today, we will proceed with EGD with esophageal stent placement with propofol  with Dr. Rosalie today.  Estelita Manas, MD 07/23/2024,  10:36 AM

## 2024-07-23 NOTE — Plan of Care (Signed)
  Problem: Education: Goal: Knowledge of General Education information will improve Description: Including pain rating scale, medication(s)/side effects and non-pharmacologic comfort measures Outcome: Progressing   Problem: Health Behavior/Discharge Planning: Goal: Ability to manage health-related needs will improve Outcome: Progressing   Problem: Clinical Measurements: Goal: Will remain free from infection Outcome: Progressing Goal: Respiratory complications will improve Outcome: Progressing Goal: Cardiovascular complication will be avoided Outcome: Progressing   Problem: Activity: Goal: Risk for activity intolerance will decrease Outcome: Progressing   Problem: Nutrition: Goal: Adequate nutrition will be maintained Outcome: Progressing   Problem: Coping: Goal: Level of anxiety will decrease Outcome: Progressing   Problem: Elimination: Goal: Will not experience complications related to bowel motility Outcome: Progressing Goal: Will not experience complications related to urinary retention Outcome: Progressing   Problem: Pain Managment: Goal: General experience of comfort will improve and/or be controlled Outcome: Progressing   Problem: Safety: Goal: Ability to remain free from injury will improve Outcome: Progressing   Problem: Skin Integrity: Goal: Risk for impaired skin integrity will decrease Outcome: Progressing

## 2024-07-23 NOTE — Transfer of Care (Signed)
 Immediate Anesthesia Transfer of Care Note  Patient: Rachel Bates  Procedure(s) Performed: EGD (ESOPHAGOGASTRODUODENOSCOPY) EGD, WITH DILATION USING SAVARY-GILLIARD DILATOR OVER GUIDEWIRE INSERTION, STENT, ESOPHAGUS FOREIGN BODY RETRIEVAL  Patient Location: ICU  Anesthesia Type:General  Level of Consciousness: sedated  Airway & Oxygen Therapy: Patient remains intubated per anesthesia plan and Patient placed on Ventilator (see vital sign flow sheet for setting)  Post-op Assessment: Report given to RN and Post -op Vital signs reviewed and stable  Post vital signs: Reviewed and stable  Last Vitals:  Vitals Value Taken Time  BP 129/55 07/23/24 13:27  Temp    Pulse 96 07/23/24 13:30  Resp 24 07/23/24 13:30  SpO2 96 % 07/23/24 13:30  Vitals shown include unfiled device data.  Last Pain:  Vitals:   07/23/24 1118  TempSrc: Temporal  PainSc: 6       Patients Stated Pain Goal: 2 (07/23/24 0752)  Complications: No notable events documented.

## 2024-07-23 NOTE — Progress Notes (Signed)
 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  This has been going on since last Thanksgiving.  GI consulted, underwent an EGD with dilatation, unsuccessful as she had persistent narrowing.  Subjective / 24h Interval events: She is doing fairly well this morning, daughter is at bedside.  She denies any significant pain, main issue is that she just cannot eat  Assesement and Plan: Principal problem Malignant esophageal stenosis, severe protein calorie malnutrition -with significant weight loss in the last 5 weeks, about 20 pounds.  GI consulted, underwent an EGD that showed likely extrinsic narrowing and dilatation was attempted.  Follow-up esophagogram showed persistent narrowing - Discussions were held for PEG tube, however a stent has been offered only for palliation and if she is not getting further treatment.  After discussing with the gastroenterology team, patient elected to have a stent placed rather than a PEG tube for comfort feeding.  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 Onesimo as an outpatient.  CT on admission showed new nodularities in the lung and hypodensities in the liver concerning for metastatic disease.  As above, getting a palliative  stent so I assume she is not going get further treatment for her cancer.  Palliative consulted  Chronic systolic CHF-euvolemic  CAD-no chest pain  Essential hypertension-resume home medications based on blood pressure trends and post stent placement  Paroxysmal A-fib-resume home medications.  Placement  History of VTE-resume apixaban  when cleared by GI  History of non-Hodgkin's lymphoma-status post R-CHOP in 2024  Leukopenia, thrombocytopenia-cancer related, stable  Scheduled Meds:  [MAR Hold] Chlorhexidine  Gluconate Cloth  6 each Topical Daily   [MAR Hold] lidocaine   1 patch Transdermal Daily   [MAR Hold] pantoprazole  (PROTONIX ) IV  40 mg Intravenous Q24H   [MAR Hold] traZODone   50 mg Oral QHS   Continuous Infusions:  sodium chloride       ceFAZolin  (ANCEF ) IV     PRN Meds:.[MAR Hold] acetaminophen  **OR** [MAR Hold] acetaminophen , [MAR Hold] diphenhydrAMINE , [MAR Hold] heparin  lock flush, [MAR Hold] labetalol , [MAR Hold] LORazepam , [MAR Hold]  morphine  injection, [MAR Hold] morphine  CONCENTRATE, [MAR Hold] mouth rinse, [MAR Hold] sodium chloride  flush  Current Outpatient Medications  Medication Instructions   apixaban  (ELIQUIS ) 5 mg, Oral, 2 times daily   carvedilol  (COREG ) 12.5 mg, 2 times daily with meals   cyanocobalamin  (VITAMIN B12) 1,000 mcg, Daily   cyclobenzaprine (FLEXERIL) 5 mg, Oral, 3 times daily PRN   dexamethasone  (DECADRON ) 4 MG tablet Take 2 tabs by mouth 2 times daily starting day before chemo. Then take 2 tabs daily for 2 days starting day after chemo. Take with food.   gabapentin  (NEURONTIN ) 300 mg, 3 times daily   HYDROcodone -acetaminophen  (NORCO/VICODIN) 5-325 MG tablet 1 tablet, Oral, Every 6 hours PRN   ipratropium (ATROVENT  HFA) 17 MCG/ACT inhaler 2  puffs, Inhalation, Every 6 hours PRN   lidocaine -prilocaine  (EMLA ) cream Apply to affected area once   loratadine  (CLARITIN ) 10 mg, Daily   ondansetron  (ZOFRAN ) 8 mg, Oral, Every 8 hours PRN    prochlorperazine  (COMPAZINE ) 10 mg, Oral, Every 6 hours PRN   ramipril  (ALTACE ) 2.5 mg, Daily   simvastatin  (ZOCOR ) 40 mg, Oral, Daily   traZODone  (DESYREL ) 50 mg, Daily at bedtime   zonisamide  (ZONEGRAN ) 50-100 mg, See admin instructions    Diet Orders (From admission, onward)     Start     Ordered   07/23/24 1125  Diet NPO time specified  Diet effective now        07/23/24 1124            DVT prophylaxis: Place and maintain sequential compression device Start: 07/17/24 2011   Lab Results  Component Value Date   PLT 118 (L) 07/23/2024      Code Status: Full Code  Family Communication: Daughter present at bedside  Status is: Inpatient Remains inpatient appropriate because: Severity of illness   Level of care: Med-Surg  Consultants:  Oncology Gastroenterology Palliative  Objective: Vitals:   07/22/24 1320 07/22/24 2136 07/23/24 0606 07/23/24 1118  BP: 120/63 (!) 152/72 (!) 149/80 (!) 162/80  Pulse: 75 84 90 91  Resp: 20 18 18 20   Temp: 99.6 F (37.6 C) 98.1 F (36.7 C) 98.5 F (36.9 C) 98.5 F (36.9 C)  TempSrc:    Temporal  SpO2: 96% 93% 93% 95%  Weight:      Height:        Intake/Output Summary (Last 24 hours) at 07/23/2024 1141 Last data filed at 07/23/2024 1001 Gross per 24 hour  Intake 2339.88 ml  Output --  Net 2339.88 ml   Wt Readings from Last 3 Encounters:  07/18/24 57.3 kg  07/17/24 58.2 kg  06/04/24 63.6 kg    Examination:  Constitutional: NAD Eyes: no scleral icterus ENMT: Mucous membranes are moist.  Neck: normal, supple Respiratory: clear to auscultation bilaterally, no wheezing, no crackles.  Cardiovascular: Regular rate and rhythm, no murmurs / rubs / gallops. No LE edema.  Abdomen: non distended, no tenderness. Bowel sounds positive.  Musculoskeletal: no clubbing / cyanosis.   Data Reviewed: I have independently reviewed following labs and imaging studies   CBC Recent Labs  Lab 07/17/24 1430 07/18/24 0527  07/19/24 1035 07/20/24 0658 07/21/24 0445 07/23/24 0443  WBC 4.5 3.5* 3.8* 3.5* 3.4* 4.0  HGB 15.2* 13.6 12.9 13.0 12.8 13.5  HCT 43.7 40.1 39.1 39.8 39.0 40.8  PLT 148* 114* 106* 111* 116* 118*  MCV 89.0 92.8 93.1 93.4 94.7 94.2  MCH 31.0 31.5 30.7 30.5 31.1 31.2  MCHC 34.8 33.9 33.0 32.7 32.8 33.1  RDW 13.6 13.7 13.8 13.7 13.6 13.7  LYMPHSABS 0.4*  --   --   --   --  0.4*  MONOABS 0.4  --   --   --   --  0.5  EOSABS 0.0  --   --   --   --  0.1  BASOSABS 0.0  --   --   --   --  0.0    Recent Labs  Lab 07/17/24 1430 07/18/24 0527 07/19/24 1035 07/20/24 0658 07/21/24 0445 07/23/24 0443  NA 137 141 139 140 140 139  K 3.6 3.6 4.0 4.6 4.1 4.0  CL 101 107 104 104 105 106  CO2 26 25 27 29 28 25   GLUCOSE 113* 85 92 94 104*  102*  BUN 8 7* 6* <5* 5* <5*  CREATININE 0.53 0.50 0.55 0.56 0.48 0.46  CALCIUM  9.8 9.0 9.3 9.9 9.6 9.4  AST 24  --   --   --   --  18  ALT 15  --   --   --   --  10  ALKPHOS 96  --   --   --   --  87  BILITOT 1.1  --   --   --   --  0.9  ALBUMIN  4.1  --   --   --   --  3.4*  MG 2.1  --   --   --   --   --   INR  --  1.1  --   --   --   --   TSH 1.640  --   --   --   --   --     ------------------------------------------------------------------------------------------------------------------ No results for input(s): CHOL, HDL, LDLCALC, TRIG, CHOLHDL, LDLDIRECT in the last 72 hours.  No results found for: HGBA1C ------------------------------------------------------------------------------------------------------------------ No results for input(s): TSH, T4TOTAL, T3FREE, THYROIDAB in the last 72 hours.  Invalid input(s): FREET3  Cardiac Enzymes No results for input(s): CKMB, TROPONINI, MYOGLOBIN in the last 168 hours.  Invalid input(s): CK ------------------------------------------------------------------------------------------------------------------    Component Value Date/Time   BNP 322.0 (H) 02/15/2023 0530     CBG: No results for input(s): GLUCAP in the last 168 hours.  Recent Results (from the past 240 hours)  Resp panel by RT-PCR (RSV, Flu A&B, Covid) Anterior Nasal Swab     Status: None   Collection Time: 07/17/24  5:55 PM   Specimen: Anterior Nasal Swab  Result Value Ref Range Status   SARS Coronavirus 2 by RT PCR NEGATIVE NEGATIVE Final    Comment: (NOTE) SARS-CoV-2 target nucleic acids are NOT DETECTED.  The SARS-CoV-2 RNA is generally detectable in upper respiratory specimens during the acute phase of infection. The lowest concentration of SARS-CoV-2 viral copies this assay can detect is 138 copies/mL. A negative result does not preclude SARS-Cov-2 infection and should not be used as the sole basis for treatment or other patient management decisions. A negative result may occur with  improper specimen collection/handling, submission of specimen other than nasopharyngeal swab, presence of viral mutation(s) within the areas targeted by this assay, and inadequate number of viral copies(<138 copies/mL). A negative result must be combined with clinical observations, patient history, and epidemiological information. The expected result is Negative.  Fact Sheet for Patients:  bloggercourse.com  Fact Sheet for Healthcare Providers:  seriousbroker.it  This test is no t yet approved or cleared by the [place]  FDA and  has been authorized for detection and/or diagnosis of SARS-CoV-2 by FDA under an Emergency Use Authorization (EUA). This EUA will remain  in effect (meaning this test can be used) for the duration of the COVID-19 declaration under Section 564(b)(1) of the Act, 21 U.S.C.section 360bbb-3(b)(1), unless the authorization is terminated  or revoked sooner.       Influenza A by PCR NEGATIVE NEGATIVE Final   Influenza B by PCR NEGATIVE NEGATIVE Final    Comment: (NOTE) The Xpert Xpress SARS-CoV-2/FLU/RSV plus assay  is intended as an aid in the diagnosis of influenza from Nasopharyngeal swab specimens and should not be used as a sole basis for treatment. Nasal washings and aspirates are unacceptable for Xpert Xpress SARS-CoV-2/FLU/RSV testing.  Fact Sheet for Patients: bloggercourse.com  Fact Sheet for Healthcare Providers: seriousbroker.it  This  test is not yet approved or cleared by the [place]  FDA and has been authorized for detection and/or diagnosis of SARS-CoV-2 by FDA under an Emergency Use Authorization (EUA). This EUA will remain in effect (meaning this test can be used) for the duration of the COVID-19 declaration under Section 564(b)(1) of the Act, 21 U.S.C. section 360bbb-3(b)(1), unless the authorization is terminated or revoked.     Resp Syncytial Virus by PCR NEGATIVE NEGATIVE Final    Comment: (NOTE) Fact Sheet for Patients: bloggercourse.com  Fact Sheet for Healthcare Providers: seriousbroker.it  This test is not yet approved or cleared by the [place]  FDA and has been authorized for detection and/or diagnosis of SARS-CoV-2 by FDA under an Emergency Use Authorization (EUA). This EUA will remain in effect (meaning this test can be used) for the duration of the COVID-19 declaration under Section 564(b)(1) of the Act, 21 U.S.C. section 360bbb-3(b)(1), unless the authorization is terminated or revoked.  Performed at [facility], [address], [place], [place] [address]   Urine Culture     Status: Abnormal   Collection Time: 07/17/24  7:30 PM   Specimen: Urine, Random  Result Value Ref Range Status   Specimen Description   Final    URINE, RANDOM Performed at [facility], [address] [place], [place] [address]    Special Requests   Final    NONE Reflexed from [id number] Performed at [facility], [address], [place], [place] [address]    Culture (A)  Final    50,000 COLONIES/mL MULTIPLE SPECIES PRESENT, SUGGEST RECOLLECTION   Report Status 07/19/2024 FINAL  Final     Radiology Studies: No results found.   Nilda Fendt, MD, PhD [facility]  Between 7 am - 7 pm I am available, please contact me via Amion (for emergencies) or Securechat (non urgent messages)  Between 7 pm - 7 am I am not available, please contact night coverage MD/APP via Amion  "

## 2024-07-23 NOTE — Op Note (Signed)
 [facility] Patient Name: Rachel Bates Procedure Date: 07/23/2024 MRN: [id number] Attending MD: Oliva Boots , MD, [id number] Date of Birth: 1946/02/13 CSN: [id number] Age: 79 Admit Type: Inpatient Procedure:                Upper GI endoscopy Indications:              Dysphagia, Abnormal UGI series, Abnormal CT of the                            GI tract Providers:                Oliva Boots, MD, Gregoria Pierce, RN, Coye Bade, Technician Referring MD:              Medicines:                Monitored Anesthesia Care Complications:            Aspiration requiring intubation Estimated Blood Loss:     Estimated blood loss: none. Procedure:                Pre-Anesthesia Assessment:                           - Prior to the procedure, a History and Physical                            was performed, and patient medications and                            allergies were reviewed. The patient's tolerance of                            previous anesthesia was also reviewed. The risks                            and benefits of the procedure and the sedation                            options and risks were discussed with the patient.                            All questions were answered, and informed consent                            was obtained. Prior Anticoagulants: The patient has                            taken Eliquis  (apixaban ), last dose was 2 days                            prior to procedure. ASA Grade Assessment: III - A  patient with severe systemic disease. After                            reviewing the risks and benefits, the patient was                            deemed in satisfactory condition to undergo the                            procedure.                           After obtaining informed consent, the endoscope was                            passed under direct vision. Throughout the                             procedure, the patient's blood pressure, pulse, and                            oxygen saturations were monitored continuously. The                            [id number]-H190 ([id number]) Olympus endoscope was introduced                            through the mouth, and advanced to the second part                            of duodenum. After we decided to stop trying to                            place the stent and clean the food from the                            esophagus we switched to the regular upper                            endoscope and the upper GI endoscopy was                            technically difficult and complex due to abnormal                            anatomy. Successful completion of the procedure was                            aided by performing the maneuvers documented                            (below) in this report. The patient tolerated the  procedure. Scope In: Scope Out: Findings:      Food was found in the upper third of the esophagus. Removal was       accomplished after intubation with a Madelyn net and lots of washing and       suctioning and pushing some food into the stomach.      One extrinsic moderate (circumferential scarring or stenosis; an       endoscope may pass) stenosis was found in her midesophagus. The stenosis       was traversed. A guidewire was placed under fluoroscopic guidance and       the scope was withdrawn. Dilation was performed with a Savary dilator       with mild resistance at 14 mm. We held the dilator and placed with       fluoroscopy confirmation for over 4 minutes and there was no       desaturation at this point and later in the procedure the dilation site       was examined following endoscope reinsertion and showed no change. Once       the dilation was complete we advanced the stent and placed it in the       proper position under fluoroscopy and we reinserted the scope to confirm        proper positioning but initially due to all the food in the proximal       esophagus we could not properly watch the stent open and confirmed       proper positioning so initially at this point we remove the stent wire       and scope and that is when she desaturated despite suctioning her oxygen       would not come above 70 and we elected to intubate her at that point and       then all the food was cleaned out as above and then we proceeded with       placing the stent in the customary fashion this was stented with a 23 mm       x 10.5 cm WallFlex covered stent under fluoroscopic guidance which went       very smoothly. And after stent placement we could not get the regular       scope past the middle of the stent so we reinserted the ultraslim scope       and confirmed proper positioning of the scope with the bottom of the       stent just at the top of the hiatal hernia      A small hiatal hernia was present.      One benign-appearing, intrinsic mild stenosis was found. The stenosis       was traversed.      The entire examined stomach was normal.      The duodenal bulb, first portion of the duodenum and second portion of       the duodenum were normal.      The cardia and gastric fundus were normal on retroflexion. Impression:               - Food in the upper third of the esophagus. Removal                            was successful.                           -  Extrinsic narrowing of the esophagus. Dilated.                            Prosthesis placed.                           - Small hiatal hernia.                           - Benign-appearing esophageal stenosis.                           - Normal stomach.                           - Normal duodenal bulb, first portion of the                            duodenum and second portion of the duodenum. Moderate Sedation:      Not Applicable - Patient had care per Anesthesia. Recommendation:           - NPO today. When extubated keep  head of bed                            elevated to 45 degrees at all times and may begin                            with clear liquids when able and then will need                            dietary and nutrition team to go over the post                            stent diet to include antireflux measures and                            plenty of liquids after eating and chewing her food                            very well taking very small bites and eating very                            slowly                           - Continue present medications.                           - Return to GI clinic in 2-3 weeks.                           - Telephone GI clinic if symptomatic PRN. My                            partner Dr. Saintclair will  be available today and check                            on tomorrow Procedure Code(s):        --- Professional ---                           [phone], Esophagogastroduodenoscopy, flexible,                            transoral; with placement of endoscopic stent                            (includes pre- and post-dilation and guide wire                            passage, when performed)                           43247, Esophagogastroduodenoscopy, flexible,                            transoral; with removal of foreign body(s) Diagnosis Code(s):        --- Professional ---                           U81.871J, Food in esophagus causing other injury,                            initial encounter                           K22.2, Esophageal obstruction                           K44.9, Diaphragmatic hernia without obstruction or                            gangrene                           R13.10, Dysphagia, unspecified                           R93.3, Abnormal findings on diagnostic imaging of                            other parts of digestive tract CPT copyright 2022 [employer]. All rights reserved. The codes documented in this report are preliminary  and upon coder review may  be revised to meet current compliance requirements. Oliva Boots, MD 07/23/2024 1:20:52 PM This report has been signed electronically. Number of Addenda: 0

## 2024-07-23 NOTE — Anesthesia Preprocedure Evaluation (Addendum)
"                                    Anesthesia Evaluation  Patient identified by MRN, date of birth, ID band Patient awake    Reviewed: Allergy & Precautions, NPO status , Patient's Chart, lab work & pertinent test results  History of Anesthesia Complications (+) DIFFICULT AIRWAY and history of anesthetic complications  Airway Mallampati: I  TM Distance: >3 FB Neck ROM: Full    Dental  (+) Edentulous Upper, Edentulous Lower   Pulmonary former smoker    + decreased breath sounds      Cardiovascular hypertension, + CAD, + Past MI, + Cardiac Stents and +CHF  Normal cardiovascular exam+ dysrhythmias Atrial Fibrillation + Cardiac Defibrillator      Neuro/Psych  Headaches PSYCHIATRIC DISORDERS Anxiety Depression       GI/Hepatic negative GI ROS, Neg liver ROS,,,  Endo/Other  negative endocrine ROS    Renal/GU negative Renal ROS     Musculoskeletal  (+) Arthritis ,    Abdominal   Peds  Hematology  (+) Blood dyscrasia, anemia   Anesthesia Other Findings   Reproductive/Obstetrics                              Anesthesia Physical Anesthesia Plan  ASA: 3  Anesthesia Plan: MAC   Post-op Pain Management: Minimal or no pain anticipated   Induction: Intravenous  PONV Risk Score and Plan: 0 and Propofol  infusion  Airway Management Planned: Natural Airway  Additional Equipment: None  Intra-op Plan:   Post-operative Plan:   Informed Consent:   Plan Discussed with: CRNA  Anesthesia Plan Comments:          Anesthesia Quick Evaluation  "

## 2024-07-23 NOTE — Progress Notes (Signed)
 Pt intubated during procedure. See MD notes. Pt for transfer to ICU per [facility]. Dr Vianne notified. MD notified daughter.

## 2024-07-24 ENCOUNTER — Encounter: Payer: Self-pay | Admitting: Hematology

## 2024-07-24 ENCOUNTER — Inpatient Hospital Stay (HOSPITAL_COMMUNITY)

## 2024-07-24 DIAGNOSIS — Z8572 Personal history of non-Hodgkin lymphomas: Secondary | ICD-10-CM

## 2024-07-24 DIAGNOSIS — E785 Hyperlipidemia, unspecified: Secondary | ICD-10-CM

## 2024-07-24 DIAGNOSIS — K222 Esophageal obstruction: Secondary | ICD-10-CM | POA: Diagnosis not present

## 2024-07-24 DIAGNOSIS — I5023 Acute on chronic systolic (congestive) heart failure: Secondary | ICD-10-CM | POA: Diagnosis not present

## 2024-07-24 DIAGNOSIS — I480 Paroxysmal atrial fibrillation: Secondary | ICD-10-CM

## 2024-07-24 DIAGNOSIS — D696 Thrombocytopenia, unspecified: Secondary | ICD-10-CM

## 2024-07-24 DIAGNOSIS — I110 Hypertensive heart disease with heart failure: Secondary | ICD-10-CM

## 2024-07-24 DIAGNOSIS — I2510 Atherosclerotic heart disease of native coronary artery without angina pectoris: Secondary | ICD-10-CM

## 2024-07-24 DIAGNOSIS — J9601 Acute respiratory failure with hypoxia: Secondary | ICD-10-CM | POA: Diagnosis not present

## 2024-07-24 DIAGNOSIS — E43 Unspecified severe protein-calorie malnutrition: Secondary | ICD-10-CM

## 2024-07-24 LAB — CBC
HCT: 47.2 % — ABNORMAL HIGH (ref 36.0–46.0)
Hemoglobin: 15.4 g/dL — ABNORMAL HIGH (ref 12.0–15.0)
MCH: 30.6 pg (ref 26.0–34.0)
MCHC: 32.6 g/dL (ref 30.0–36.0)
MCV: 93.8 fL (ref 80.0–100.0)
Platelets: 204 K/uL (ref 150–400)
RBC: 5.03 MIL/uL (ref 3.87–5.11)
RDW: 13.9 % (ref 11.5–15.5)
WBC: 13.7 K/uL — ABNORMAL HIGH (ref 4.0–10.5)
nRBC: 0 % (ref 0.0–0.2)

## 2024-07-24 LAB — COMPREHENSIVE METABOLIC PANEL WITH GFR
ALT: 10 U/L (ref 0–44)
AST: 19 U/L (ref 15–41)
Albumin: 3.5 g/dL (ref 3.5–5.0)
Alkaline Phosphatase: 96 U/L (ref 38–126)
Anion gap: 11 (ref 5–15)
BUN: 6 mg/dL — ABNORMAL LOW (ref 8–23)
CO2: 23 mmol/L (ref 22–32)
Calcium: 10 mg/dL (ref 8.9–10.3)
Chloride: 104 mmol/L (ref 98–111)
Creatinine, Ser: 0.59 mg/dL (ref 0.44–1.00)
GFR, Estimated: >60 mL/min
Glucose, Bld: 143 mg/dL — ABNORMAL HIGH (ref 70–99)
Potassium: 4.6 mmol/L (ref 3.5–5.1)
Sodium: 138 mmol/L (ref 135–145)
Total Bilirubin: 1.4 mg/dL — ABNORMAL HIGH (ref 0.0–1.2)
Total Protein: 6.2 g/dL — ABNORMAL LOW (ref 6.5–8.1)

## 2024-07-24 LAB — GLUCOSE, CAPILLARY
Glucose-Capillary: 152 mg/dL — ABNORMAL HIGH (ref 70–99)
Glucose-Capillary: 124 mg/dL — ABNORMAL HIGH (ref 70–99)
Glucose-Capillary: 100 mg/dL — ABNORMAL HIGH (ref 70–99)
Glucose-Capillary: 114 mg/dL — ABNORMAL HIGH (ref 70–99)
Glucose-Capillary: 100 mg/dL — ABNORMAL HIGH (ref 70–99)

## 2024-07-24 LAB — MAGNESIUM: Magnesium: 1.8 mg/dL (ref 1.7–2.4)

## 2024-07-24 LAB — PHOSPHORUS: Phosphorus: 5.3 mg/dL — ABNORMAL HIGH (ref 2.5–4.6)

## 2024-07-24 LAB — TRIGLYCERIDES: Triglycerides: 109 mg/dL

## 2024-07-24 MED ORDER — BOOST / RESOURCE BREEZE PO LIQD CUSTOM: 1.0000 | Freq: Three times a day (TID) | ORAL | Status: DC

## 2024-07-24 MED ORDER — HYDROMORPHONE HCL 1 MG/ML IJ SOLN
1.0000 mg | INTRAMUSCULAR | Status: DC | PRN
  Administered 2024-07-24: 1 mg via INTRAVENOUS
  Filled 2024-07-24: qty 1

## 2024-07-24 MED ORDER — HYDROMORPHONE HCL 1 MG/ML IJ SOLN
1.0000 mg | INTRAMUSCULAR | Status: DC | PRN
  Administered 2024-07-24 – 2024-07-25 (×6): 1 mg via INTRAVENOUS
  Filled 2024-07-24 (×6): qty 1

## 2024-07-24 MED ORDER — POLYETHYLENE GLYCOL 3350 17 G PO PACK: 17.0000 g | PACK | Freq: Every day | ORAL | Status: DC

## 2024-07-24 MED ORDER — SENNA 8.6 MG PO TABS
1.0000 | ORAL_TABLET | Freq: Two times a day (BID) | ORAL | Status: DC
  Administered 2024-07-25: 8.6 mg via ORAL
  Filled 2024-07-24: qty 1

## 2024-07-24 MED ORDER — HYDROMORPHONE HCL 1 MG/ML IJ SOLN
0.5000 mg | INTRAMUSCULAR | Status: DC | PRN
  Administered 2024-07-24: 0.5 mg via INTRAVENOUS
  Filled 2024-07-24: qty 1

## 2024-07-24 MED ORDER — APIXABAN 5 MG PO TABS
5.0000 mg | ORAL_TABLET | Freq: Two times a day (BID) | ORAL | Status: DC
  Administered 2024-07-24 – 2024-07-25 (×3): 5 mg via ORAL
  Filled 2024-07-24 (×3): qty 1

## 2024-07-24 NOTE — Progress Notes (Addendum)
 Subjective: Patient was successfully extubated.  She complains of back pain.  Denies chest pressure.  Objective: Vital signs in last 24 hours: Temp:  [96.7 F (35.9 C)-98.5 F (36.9 C)] 98 F (36.7 C) (01/15 0745) Pulse Rate:  [52-114] 108 (01/15 1015) Resp:  [11-46] 46 (01/15 1015) BP: (74-162)/(31-80) 138/61 (01/15 1015) SpO2:  [92 %-100 %] 94 % (01/15 1015) FiO2 (%):  [50 %-100 %] 50 % (01/15 0815) Weight change:  Last BM Date : 07/19/24  PE: Not in distress GENERAL: Alert, awake, oriented  ABDOMEN: Nondistended EXTREMITIES: No deformity  Lab Results: Results for orders placed or performed during the hospital encounter of 07/17/24 (from the past 48 hours)  Culture, Respiratory w Gram Stain     Status: None (Preliminary result)   Collection Time: 07/23/24 12:28 AM   Specimen: Tracheal Aspirate; Respiratory  Result Value Ref Range   Specimen Description      TRACHEAL ASPIRATE Performed at [facility], [address] [place], [place] [address]    Special Requests      NONE Performed at [facility], [address] [place], [place] [address]    Gram Stain      FEW WBC PRESENT, PREDOMINANTLY PMN FEW GRAM POSITIVE COCCI RARE GRAM NEGATIVE RODS RARE YEAST Performed at [facility], [address], [place], [place] [address]    Culture PENDING    Report Status PENDING   CBC with Differential/Platelet     Status: Abnormal   Collection Time: 07/23/24  4:43 AM  Result Value Ref Range   WBC 4.0 4.0 - 10.5 K/uL   RBC 4.33 3.87 - 5.11 MIL/uL   Hemoglobin 13.5 12.0 - 15.0 g/dL   HCT 59.1 63.9 - 53.9 %   MCV 94.2 80.0 - 100.0 fL   MCH 31.2 26.0 - 34.0 pg   MCHC 33.1 30.0 - 36.0 g/dL   RDW 86.2 88.4 - 84.4 %   Platelets 118 (L) 150 - 400 K/uL   nRBC 0.0 0.0 - 0.2 %   Neutrophils Relative % 78 %   Neutro Abs 3.1 1.7 - 7.7 K/uL   Lymphocytes Relative 10 %   Lymphs Abs 0.4 (L) 0.7 - 4.0 K/uL   Monocytes Relative 11 %    Monocytes Absolute 0.5 0.1 - 1.0 K/uL   Eosinophils Relative 1 %   Eosinophils Absolute 0.1 0.0 - 0.5 K/uL   Basophils Relative 0 %   Basophils Absolute 0.0 0.0 - 0.1 K/uL   Immature Granulocytes 0 %   Abs Immature Granulocytes 0.01 0.00 - 0.07 K/uL    Comment: Performed at [facility], [address], [place], [place] [address]  Comprehensive metabolic panel with GFR     Status: Abnormal   Collection Time: 07/23/24  4:43 AM  Result Value Ref Range   Sodium 139 135 - 145 mmol/L   Potassium 4.0 3.5 - 5.1 mmol/L   Chloride 106 98 - 111 mmol/L   CO2 25 22 - 32 mmol/L   Glucose, Bld 102 (H) 70 - 99 mg/dL    Comment: Glucose reference range applies only to samples taken after fasting for at least 8 hours.   BUN <5 (L) 8 - 23 mg/dL   Creatinine, Ser 9.53 0.44 - 1.00 mg/dL   Calcium  9.4 8.9 - 10.3 mg/dL   Total Protein 5.9 (L) 6.5 - 8.1 g/dL   Albumin  3.4 (L) 3.5 - 5.0 g/dL   AST 18 15 -  41 U/L   ALT 10 0 - 44 U/L   Alkaline Phosphatase 87 38 - 126 U/L   Total Bilirubin 0.9 0.0 - 1.2 mg/dL   GFR, Estimated >39 >39 mL/min    Comment: (NOTE) Calculated using the CKD-EPI Creatinine Equation (2021)    Anion gap 8 5 - 15    Comment: Performed at [facility], [address], [place], [place] [address]  Blood gas, arterial     Status: Abnormal   Collection Time: 07/23/24  3:19 PM  Result Value Ref Range   FIO2 100 %   Delivery systems VENTILATOR    Mode PRESSURE REGULATED VOLUME CONTROL    MECHVT 490 mL   RATE 15 resp/min   PEEP 5 cm H20   pH, Arterial 7.28 (L) 7.35 - 7.45   pCO2 arterial 58 (H) 32 - 48 mmHg   pO2, Arterial 216 (H) 83 - 108 mmHg   Bicarbonate 27.3 20.0 - 28.0 mmol/L   Acid-base deficit 0.6 0.0 - 2.0 mmol/L   O2 Saturation 100 %   Patient temperature 36.9    Drawn by [id number]    Allens test (pass/fail) PASS PASS    Comment: Performed at [facility], [address] [place], [place] [address]  MRSA Next Gen  by PCR, Nasal     Status: None   Collection Time: 07/23/24  3:41 PM   Specimen: Nasal Mucosa; Nasal Swab  Result Value Ref Range   MRSA by PCR Next Gen NOT DETECTED NOT DETECTED    Comment: (NOTE) The GeneXpert MRSA Assay (FDA approved for NASAL specimens only), is one component of a comprehensive MRSA colonization surveillance program. It is not intended to diagnose MRSA infection nor to guide or monitor treatment for MRSA infections. Test performance is not FDA approved in patients less than 33 years old. Performed at [facility], [address], [place], [place] [address]   Glucose, capillary     Status: Abnormal   Collection Time: 07/23/24  4:37 PM  Result Value Ref Range   Glucose-Capillary 129 (H) 70 - 99 mg/dL    Comment: Glucose reference range applies only to samples taken after fasting for at least 8 hours.  Blood gas, arterial     Status: Abnormal   Collection Time: 07/23/24  5:43 PM  Result Value Ref Range   FIO2 60 %   Delivery systems VENTILATOR    Mode PRESSURE REGULATED VOLUME CONTROL    MECHVT 490 mL   RATE 20 resp/min   PEEP 5 cm H20   pH, Arterial 7.34 (L) 7.35 - 7.45   pCO2 arterial 47 32 - 48 mmHg   pO2, Arterial 92 83 - 108 mmHg   Bicarbonate 25.3 20.0 - 28.0 mmol/L   Acid-base deficit 1.1 0.0 - 2.0 mmol/L   O2 Saturation 100 %   Patient temperature 36.4    Collection site LEFT RADIAL    Drawn by [id number]    Allens test (pass/fail) PASS PASS    Comment: Performed at [facility], [address], [place], [place] [address]  Glucose, capillary     Status: Abnormal   Collection Time: 07/23/24  8:11 PM  Result Value Ref Range   Glucose-Capillary 126 (H) 70 - 99 mg/dL    Comment: Glucose reference range applies only to samples taken after fasting for at least 8 hours.  Glucose, capillary     Status: Abnormal   Collection Time: 07/23/24 11:49 PM  Result Value Ref Range   Glucose-Capillary 137 (H) 70 - 99 mg/dL     Comment: Glucose reference range applies only to samples taken after fasting for at least 8 hours.  Glucose, capillary     Status: Abnormal   Collection Time: 07/24/24  3:45 AM  Result Value Ref Range   Glucose-Capillary 152 (H) 70 - 99 mg/dL    Comment: Glucose reference range applies only to samples taken after fasting for at least 8 hours.  Comprehensive metabolic panel with GFR     Status: Abnormal   Collection Time: 07/24/24  5:49 AM  Result Value Ref Range   Sodium 138 135 - 145 mmol/L   Potassium 4.6 3.5 - 5.1 mmol/L   Chloride 104 98 - 111 mmol/L   CO2 23 22 - 32 mmol/L   Glucose, Bld 143 (H) 70 - 99 mg/dL    Comment: Glucose reference range applies only to samples taken after fasting for at least 8 hours.   BUN 6 (L) 8 - 23 mg/dL   Creatinine, Ser 9.40 0.44 - 1.00 mg/dL   Calcium  10.0 8.9 - 10.3 mg/dL   Total Protein 6.2 (L) 6.5 - 8.1 g/dL   Albumin  3.5 3.5 - 5.0 g/dL   AST 19 15 - 41 U/L   ALT 10 0 - 44 U/L   Alkaline Phosphatase 96 38 - 126 U/L   Total Bilirubin 1.4 (H) 0.0 - 1.2 mg/dL   GFR, Estimated >39 >39 mL/min    Comment: (NOTE) Calculated using the CKD-EPI Creatinine Equation (2021)    Anion gap 11 5 - 15    Comment: Performed at [facility], [address] [place], [place] [address]  CBC     Status: Abnormal   Collection Time: 07/24/24  5:49 AM  Result Value Ref Range   WBC 13.7 (H) 4.0 - 10.5 K/uL   RBC 5.03 3.87 - 5.11 MIL/uL   Hemoglobin 15.4 (H) 12.0 - 15.0 g/dL   HCT 52.7 (H) 63.9 - 53.9 %   MCV 93.8 80.0 - 100.0 fL   MCH 30.6 26.0 - 34.0 pg   MCHC 32.6 30.0 - 36.0 g/dL   RDW 86.0 88.4 - 84.4 %   Platelets 204 150 - 400 K/uL   nRBC 0.0 0.0 - 0.2 %    Comment: Performed at [facility], [address] [place], [place] [address]  Phosphorus     Status: Abnormal   Collection Time: 07/24/24  5:49 AM  Result Value Ref Range   Phosphorus 5.3 (H) 2.5 - 4.6 mg/dL    Comment: Performed at [facility], [address] [place], [place] [address]  Magnesium      Status: None   Collection Time: 07/24/24  5:49 AM  Result Value Ref Range   Magnesium  1.8 1.7 - 2.4 mg/dL    Comment: Performed at [facility], [address] [place], [place] [address]  Triglycerides     Status: None   Collection Time: 07/24/24  5:49 AM  Result Value Ref Range   Triglycerides 109 <150 mg/dL    Comment: Performed at [facility], [address], [place], [place] [address]  Glucose, capillary     Status: Abnormal   Collection Time: 07/24/24  7:45 AM  Result Value Ref Range   Glucose-Capillary 124 (H) 70 - 99 mg/dL    Comment: Glucose reference range applies only to samples taken after fasting for at least  8 hours.    Studies/Results: DG Chest Port 1 View Result Date: 07/23/2024 CLINICAL DATA:  Intubation. EXAM: PORTABLE CHEST 1 VIEW COMPARISON:  March 25, 2024. FINDINGS: The heart size and mediastinal contours are within normal limits. Distal esophageal stent is noted. Endotracheal tube is in grossly good position. Left-sided defibrillator leads are again noted. Right internal jugular Port-A-Cath is unchanged. Increased left basilar opacity is noted concerning for atelectasis or infiltrate. Stable right upper lobe atelectasis or infiltrate is noted. The visualized skeletal structures are unremarkable. IMPRESSION: Endotracheal tube is in grossly good position. Increased left basilar opacity is noted concerning for atelectasis or infiltrate. Stable right upper lobe opacity is noted. Electronically Signed   By: Lynwood Landy Raddle M.D.   On: 07/23/2024 14:14   DG ESOPHAGUS DILATION Result Date: 07/23/2024 ESOPHAGEAL DILATATION: Fluoroscopy was provided for use by the requesting physician.  No images were obtained for radiographic interpretation.   Medications: I have reviewed the patient's current medications.  Assessment: Status post esophageal stent placement by Dr.  Rosalie yesterday for palliation, to relieve dysphagia due to extrinsic compression from paraesophageal mass.  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 Thereafter recommend, thick liquids and pures(thicker, nutrient dense liquids like milkshakes, soft soups, no lumps, yogurt, applesauce or pured baby food as tolerated) Day 2-3 onwards, okay to introduce soft, moist solids like scrambled eggs, mashed potatoes, soft fish, cottage cheese, or well cooked soft vegetables without skins.  Patient advised to take moist, soft solids, ongoing thereafter, gradually adding moist easily chewed foods like ground meats with gravy, pasta, soft foods, always ensuring they are moist.  Advised to take small bites, chew well(take small mouthfuls, chew thoroughly until its almost liquid, moisture is key), add sauces, gravies, butter or cheese to keep them moist, drink fluids, water , warm beverages even carbonated drinks like soda to help food pass during and after meals.  Eat 4-6 small meals a day and stop when full.  Stay upright during meals and for 1 to 2 hours thereafter.  Avoid dry crumbly foods such as bread and crackers, stringy vegetables like celery, corn, green beans, spicy or irritating foods like caffeine and alcohol, or foods with skins and seeds such as fruit/vegetable skins/nut.  Advised to always stay on an acid suppressing medicine) pantoprazole  40 mg once a day.  If this does not control symptoms of acid reflux heartburn, increase to pantoprazole  twice a day and needs to be on a PPI indefinitely.  This was discussed with the  patient along with the daughter present at bedside in ICU.  GI will sign off, please recall if needed.  Estelita Manas, MD 07/24/2024, 10:52 AM

## 2024-07-24 NOTE — Procedures (Signed)
 Extubation Procedure Note  Patient Details:   Name: Rachel Bates DOB: Nov 04, 1945 MRN: [id number]   Airway Documentation:    Vent end date: 07/24/24 Vent end time: 0918   Evaluation  O2 sats: stable throughout Complications: No apparent complications Patient did tolerate procedure well. Bilateral Breath Sounds: Diminished   Yes  Patient tolerated wean. MD ordered to extubate. Positive for cuff leak. Patient extubated to a 6Lpm [place]. No signs of stridor noted. Patient resting comfortably. RN and daughter at bedside.   Estell Sailors 07/24/2024, 9:26 AM

## 2024-07-24 NOTE — Evaluation (Signed)
 Clinical/Bedside Swallow Evaluation Patient Details  Name: Rachel Bates MRN: [id number] Date of Birth: Jan 15, 1946  Today's Date: 07/24/2024 Time: SLP Start Time (ACUTE ONLY): 1540 SLP Stop Time (ACUTE ONLY): 1620 SLP Time Calculation (min) (ACUTE ONLY): 40 min  Past Medical History:  Past Medical History:  Diagnosis Date   AICD (automatic cardioverter/defibrillator) present dual   Medtronic - original placed 2007, generator change 2014, generator extraction 03/14/2024   Anemia    Anticoagulant long-term use    eliquis    Anxiety    Arthralgia of multiple joints    Arthritis pain    Benign hypertensive heart disease    CAD (coronary artery disease) primary cardiologist-- dr danelle taylor   MI and 2 stents 1995 in [place]    Cancer [facility])    Cataract ?   Surgery 2020   CHF NYHA class II, chronic, systolic (HCC)    followed by dr danelle taylor   Complication of anesthesia    Degenerative disc disease, lumbar    Depression    Difficult intubation    Dyslipidemia    History of basal cell carcinoma (BCC) excision    right ankle area s/p  excision in office 06/ 2019  in office   History of DVT of lower extremity yrs ago before 2012   History of MI (myocardial infarction) 1995  in [place]   History of pulmonary embolus (PE) 2012   History of ventricular tachycardia    Hyperlipidemia    Hypersomnia    Hypertension    Ischemic cardiomyopathy    Migraines    Myocardial infarction (HCC)    OA (osteoarthritis)    all over   PAF (paroxysmal atrial fibrillation) (HCC)    Pneumonia    02/2023   Primary localized osteoarthritis of right hip 12/18/2017   Primary localized osteoarthritis of right knee 05/28/2018   S/P coronary artery stent placement 1995   [facility]   05-21-2018 per pt x2  stents in same coronary artery (unsure BM or DES)   Vitamin D  deficiency disease    Past Surgical History:  Past Surgical History:  Procedure Laterality Date   BIOPSY  06/19/2022   Procedure: BIOPSY;   Surgeon: Elicia Claw, MD;  Location: [facility] END[facility];  Service: Gastroenterology;;   BRONCHIAL BIOPSY  08/07/2023   Procedure: BRONCHIAL BIOPSIES;  Surgeon: Shelah Lamar RAMAN, MD;  Location: [facility] ENDOSCOPY;  Service: Pulmonary;;   BRONCHIAL BRUSHINGS  08/07/2023   Procedure: BRONCHIAL BRUSHINGS;  Surgeon: Shelah Lamar RAMAN, MD;  Location: [facility] ENDOSCOPY;  Service: Pulmonary;;   BRONCHIAL BRUSHINGS  03/17/2024   Procedure: BRONCHOSCOPY, WITH BRUSH BIOPSY;  Surgeon: Shelah Lamar RAMAN, MD;  Location: [facility] ENDOS[facility];  Service: Pulmonary;;   BRONCHIAL NEEDLE ASPIRATION BIOPSY  08/07/2023   Procedure: BRONCHIAL NEEDLE ASPIRATION BIOPSIES;  Surgeon: Shelah Lamar RAMAN, MD;  Location: [facility] ENDOS[facility];  Service: Pulmonary;;   BRONCHIAL NEEDLE ASPIRATION BIOPSY  03/17/2024   Procedure: BRONCHOSCOPY, WITH NEEDLE ASPIRATION BIOPSY;  Surgeon: Shelah Lamar RAMAN, MD;  Location: [facility];  Service: Pulmonary;;   CARDIAC DEFIBRILLATOR PLACEMENT  2007   CARPAL TUNNEL RELEASE Right 02/26/2024   Procedure: CARPAL TUNNEL RELEASE;  Surgeon: Josefina Chew, MD;  Location: [facility] OR;  Service: Orthopedics;  Laterality: Right;   CATARACT EXTRACTION, BILATERAL     COLONOSCOPY  04/14/2013   colonic polyp, status post polypectomy. Mild panocolonic diverticulosis. Small internal hemorrhoids   CRYOTHERAPY  03/17/2024   Procedure: CRYOTHERAPY;  Surgeon: Shelah Lamar RAMAN, MD;  Location: [facility] ENDOSCOPY;  Service: Pulmonary;;  DILATION AND CURETTAGE OF UTERUS  yrs ago   DIRECT LARYNGOSCOPY Right 10/04/2022   Procedure: DIRECT LARYNGOSCOPY;  Surgeon: Llewellyn Gerard LABOR, DO;  Location: [facility] OR;  Service: ENT;  Laterality: Right;   ENDOBRONCHIAL ULTRASOUND Bilateral 03/17/2024   Procedure: ENDOBRONCHIAL ULTRASOUND (EBUS);  Surgeon: Shelah Lamar RAMAN, MD;  Location: [facility] ENDOSCOPY;  Service: Pulmonary;  Laterality: Bilateral;   ESOPHAGOGASTRODUODENOSCOPY N/A 07/19/2024   Procedure: EGD (ESOPHAGOGASTRODUODENOSCOPY);  Surgeon: Elicia Claw, MD;  Location:  [facility];  Service: Gastroenterology;  Laterality: N/A;   ESOPHAGOGASTRODUODENOSCOPY (EGD) WITH PROPOFOL  N/A 06/19/2022   Procedure: ESOPHAGOGASTRODUODENOSCOPY (EGD) WITH PROPOFOL ;  Surgeon: Elicia Claw, MD;  Location: [facility];  Service: Gastroenterology;  Laterality: N/A;   EYE SURGERY  2020   Cataracts removed   FIDUCIAL MARKER PLACEMENT  08/07/2023   Procedure: FIDUCIAL MARKER PLACEMENT;  Surgeon: Shelah Lamar RAMAN, MD;  Location: [facility] ENDOSCOPY;  Service: Pulmonary;;   FOREIGN BODY REMOVAL  07/19/2024   Procedure: REMOVAL, FOREIGN BODY;  Surgeon: Elicia Claw, MD;  Location: [facility];  Service: Gastroenterology;;   ICD GENERATOR REMOVAL N/A 03/14/2024   Procedure: ICD GENERATOR REMOVAL;  Surgeon: Waddell Danelle ORN, MD;  [facility]: [facility];  Service: Cardiovascular;  Laterality: N/A;   IMPACTION REMOVAL  06/19/2022   Procedure: IMPACTION REMOVAL;  Surgeon: Elicia Claw, MD;  Location: [facility];  Service: Gastroenterology;;   IMPLANTABLE CARDIOVERTER DEFIBRILLATOR GENERATOR CHANGE N/A 02/04/2013   Procedure: IMPLANTABLE CARDIOVERTER DEFIBRILLATOR GENERATOR CHANGE;  Surgeon: Danelle ORN Waddell, MD;  Location: [facility] CATH LAB;  Service: Cardiovascular;  Laterality: N/A;   IR IMAGING GUIDED PORT INSERTION  10/27/2022   JOINT REPLACEMENT  2019, 2022, 2023   Both hips, both knees   LYMPH NODE BIOPSY Right 10/04/2022   Procedure: EXCISIONAL OF RIGHT DEEP CERVICAL LYMPH NODE;  Surgeon: Llewellyn Gerard LABOR, DO;  Location: [facility] OR;  Service: ENT;  Laterality: Right;   SHOULDER ARTHROSCOPY Right 2015   TOTAL HIP ARTHROPLASTY Right 12/18/2017   Procedure: RIGHT TOTAL HIP ARTHROPLASTY;  Surgeon: Josefina Chew, MD;  Location: [facility];  Service: Orthopedics;  Laterality: Right;   TOTAL HIP ARTHROPLASTY Left 04/19/2021   Procedure: TOTAL HIP ARTHROPLASTY;  Surgeon: Josefina Chew, MD;  Location: [facility] ORS;  Service: Orthopedics;  Laterality: Left;   TOTAL KNEE ARTHROPLASTY Right  05/28/2018   Procedure: TOTAL KNEE ARTHROPLASTY;  Surgeon: Josefina Chew, MD;  Location: [facility] ORS;  Service: Orthopedics;  Laterality: Right;  Adductor Block   TOTAL KNEE ARTHROPLASTY Left 08/12/2021   Procedure: TOTAL KNEE ARTHROPLASTY;  Surgeon: Josefina Chew, MD;  Location: [facility] ORS;  Service: Orthopedics;  Laterality: Left;   TUBAL LIGATION Bilateral yrs ago   VIDEO BRONCHOSCOPY WITH ENDOBRONCHIAL NAVIGATION Bilateral 03/17/2024   Procedure: VIDEO BRONCHOSCOPY WITH ENDOBRONCHIAL NAVIGATION;  Surgeon: Shelah Lamar RAMAN, MD;  Location: [facility] ENDOSCOPY;  Service: Pulmonary;  Laterality: Bilateral;  need EBUS scope also   VIDEO BRONCHOSCOPY WITH RADIAL ENDOBRONCHIAL ULTRASOUND  08/07/2023   Procedure: VIDEO BRONCHOSCOPY WITH RADIAL ENDOBRONCHIAL ULTRASOUND;  Surgeon: Shelah Lamar RAMAN, MD;  Location: [facility];  Service: Pulmonary;;   WISDOM TOOTH EXTRACTION     HPI:  Rachel Bates is a 79 y.o. female who presented to the hospital on 07/17/24 due to recurrent dysphagia. She underwent EGD on 1/10 with dilation which was unsuccessful due to significant stenosis of esophagus. 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 She declined to have a PEG placed but agreed to an  esophageal stent. Esophageal stent was placed on 1/14 but unfortunately patient aspirated severely and required to remain on mechanical ventilation s/p stenting. She was extubated 1/15. GI MD recommended clear liquids starting 1/15 pending SLP swallow evaluation. Palliative medicine is following and per Palliative NP, patient is not very interested in going through an MBS and so main goal at this time is comfort. 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    Assessment / Plan / Recommendation  Clinical Impression  All in agreement for trial of  nectar thick liquids. SLP will follow.  Patient presents with clinical s/s of dysphagia as per this bedside swallow evaluation. She has been coughing up thick, yellowish-tan secretions into emesis bag. Voice is hoarse from coughing. SLP discussed with patient and daughter treatment/assessment options including: FEES, MBS or just a BSE. SLP explained that a BSE would not be able to r/o aspiration but could be beneficial to determine the most tolerated/comfortable consistencies for PO's. Patient does not want to have an MBS but her daughter did indicate she would be in favor of that if SLP felt it would be beneficial. GI has cleared her for clear liquids pending this BSE. SLP assessed her swallow via sips of thin liquids and nectar thick liquids. Patient did exhibit immediate and delayed congested sounding but non-productive coughing with thin liquids. No coughing observed with sips of nectar thick liquids. In addition, patient indicated that nectar thick liquids felt better to her when swallowing.  SLP Visit Diagnosis: Dysphagia, unspecified (R13.10)    Aspiration Risk  Mild aspiration risk;Moderate aspiration risk    Diet Recommendation Nectar-thick liquid    Medication Administration: Via alternative means Supervision: Patient able to self feed Compensations: Slow rate;Small sips/bites Postural Changes: Seated upright at 90 degrees;Remain upright for at least 30 minutes after po intake    Other Recommendations Oral Care Recommendations: Oral care BID     Swallow Evaluation Recommendations     Assistance Recommended at Discharge    Functional Status Assessment Patient has had a recent decline in their functional status and demonstrates the ability to make significant improvements in function in a reasonable and predictable amount of time.  Frequency and Duration min 1 x/week  1 week       Prognosis Prognosis for improved oropharyngeal function: Fair Barriers to Reach Goals: Severity of  deficits      Swallow Study   General Date of Onset: 07/17/24 HPI: Rachel Bates is a 79 y.o. female who presented to the hospital on 07/17/24 due to recurrent dysphagia. She underwent EGD on 1/10 with dilation which was unsuccessful due to significant stenosis of esophagus. 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 She declined to have a PEG placed but agreed to an esophageal stent. Esophageal stent was placed on 1/14 but unfortunately patient aspirated severely and required to remain on mechanical ventilation s/p stenting. She was extubated 1/15. GI MD recommended clear liquids starting 1/15 pending SLP swallow evaluation. Palliative medicine is following and per Palliative NP, patient is not very interested in going through an MBS and so main goal at this time is comfort. 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 Type of Study: Bedside Swallow Evaluation Previous Swallow Assessment: none found Diet Prior to this  Study: NPO Temperature Spikes Noted: No Respiratory Status: Nasal cannula History of Recent Intubation: Yes Total duration of intubation (days): 2 days Date extubated: 07/24/24 Behavior/Cognition: Alert;Cooperative;Pleasant mood Oral Cavity Assessment: Dry Oral Care Completed by SLP: No Oral Cavity - Dentition: Dentures, top Vision: Functional for self-feeding Self-Feeding Abilities: Able to feed self Patient Positioning: Upright in bed Baseline Vocal Quality: Low vocal intensity Volitional Cough: Congested;Strong Volitional Swallow: Able to elicit    Oral/Motor/Sensory Function Overall Oral Motor/Sensory Function: Within functional limits   Ice Chips     Thin Liquid Thin Liquid: Impaired Presentation: Straw;Self Fed;Cup Pharyngeal  Phase Impairments: Cough -  Immediate;Cough - Delayed    Nectar Thick Nectar Thick Liquid: Within functional limits Presentation: Self Fed;Straw   Honey Thick     Puree Puree: Not tested   Solid     Solid: Not tested     Rachel IVAR Blase, MA, CCC-SLP Speech Therapy  07/24/2024,5:27 PM

## 2024-07-24 NOTE — Consult Note (Signed)
 "                                               Consultation Note Date: 07/24/2024   Patient Name: Rachel Bates  DOB: 04-17-1946  MRN: [id number]  Age / Sex: 79 y.o., female  PCP: Venancio Pock, PA-C Referring Physician: Mannam, Praveen, MD  Reason for Consultation: Establishing goals of care  HPI/Patient Profile: 79 y.o. 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 Patient underwent EGD with dilatation but unfortunately unsuccessful due to significant stenosis/narrowing of the esophagus. Patient was planned to receive a feeding tube-PEG for palliative/comfort feeding but patient decided to not pursue. However esophageal stent was offered and patient accepted to try esophageal stent versus PEG tube placement. Esophageal stent was placed on 1/14 within the endoscopy suite, unfortunately patient aspirated severely and required to remain on mechanical ventilation status post esophageal stent. She was successfully extubated 1/15. PMT consulted to discuss GOC.   Clinical Assessment and Goals of Care: I have reviewed medical records including EPIC notes, labs and imaging, received report from RN, assessed the patient and then met with patient and her daughter Rachel Bates  to discuss diagnosis prognosis, GOC, EOL wishes, disposition and options.  I introduced Palliative Medicine as specialized medical care for people living with serious illness. It focuses on providing relief from the symptoms and stress of a serious illness. The goal is to improve quality of life for both the patient and the family.  Rachel Bates tells me there are 2 other daughters but they are estranged. Patient is  interactive and able to make her wishes known. Patient's daughter Rachel Bates lives next to her (800 steps). Patient lives alone.   As far as functional and nutritional status, patient and daughter describe patient has remained functional and able to care for herself independently. However she has been struggling with keeping food down since late Nov. Daughter shares she went 40 days without tolerating much PO intake. She has significant weight loss.    We discussed patient's current illness and what it means in the larger context of patient's on-going co-morbidities.  Natural disease trajectory and expectations at EOL were discussed. We review her terminal disease.  I attempted to elicit values and goals of care important to the patient.  Patient explains she is aware she is nearing end of life. She wants to focus on quality over quantity and she does not feel pursuing aggressive medical care will lead to better quality. She wants no further cancer treatment.   The difference between aggressive medical intervention and comfort care was considered in light of the patient's goals of care. Patient is interested in focusing on her comfort.   Encouraged patient/family to consider DNR/DNI status understanding evidenced based poor outcomes in similar hospitalized patients, as the cause of the arrest is likely associated with chronic/terminal disease rather than a reversible acute cardio-pulmonary event. Patient agrees with DNR. We discuss the specific scenario of reintubation - if her respiratory status were to worsen again this hospitalization, she is quite clear she does not want to go back on  the ventilator. If this were to happen, she wants the medical team to focus on her comfort. Daughter was supportive of this decision.   Hospice and Palliative Care services outpatient were explained and offered. Hospice care is most in line with patient's goals. Described type of care provided and philosophy. She agrees to  hospice care at home. [facility]. Placed TOC order and let hospice liaison know.   Please note, ICD is listed in chart. I confirmed in notes from September 2025 and with patient that her ICD has been removed.   Lastly we reviewed her symptoms. She has ongoing cough since extubation. She is most bothered by her back pain - this is chronic for her, takes hydrocodone  at home. Pain is currently exacerbated. She has not been cleared for pills yet following her stent placement. She tells me the IV morphine  doesn't work for her at all. We discussed trying dilaudid  to which she was agreeable. We discussed started PO regimen once she is cleared for pills but for now will remain on IV.  Contacted by RN twice throughout the day that pain was unrelieved so titrated dose and frequency.   Also discussed with SLP - reviewed main goal is comfort. During our talk patient mentioned not being very interested in going through another MBS. Discussed using clinical exam to provide safest diet.   Questions and concerns were addressed. The family was encouraged to call with questions or concerns.    Primary Decision Maker PATIENT  Daughter Rachel Bates is next of kin  SUMMARY OF RECOMMENDATIONS   Change to DNR/DNI - signed DNR placed on chart - please note even if respiratory status declines during this admission she does not want reintubation, if she declines she would want full comfort measures Not interested in any further treatment of cancer, focus of care should be comfort and quality of life Referred to [facility] - [facility] order placed and liaison notified Not cleared for pills yet - morphine  ineffective - dc morphine  - start IV dilaudid  prn, have increased dose and frequency throughout the day - recommend starting oral regimen (resume home hydrocodone ) once cleared for pills  Code Status/Advance Care Planning: DNR  Discharge Planning: Home with Hospice      Primary  Diagnoses: Present on Admission:  Diffuse large B-cell lymphoma of lymph nodes of multiple regions (HCC)  Cancer of upper lobe of right lung (HCC)  Mediastinal adenopathy  Weight loss  Pulmonary embolism (HCC)  Paroxysmal atrial fibrillation (HCC)   I have reviewed the medical record, interviewed the patient and family, and examined the patient. The following aspects are pertinent.  Past Medical History:  Diagnosis Date   AICD (automatic cardioverter/defibrillator) present dual   Medtronic - original placed 2007, generator change 2014, generator extraction 03/14/2024   Anemia    Anticoagulant long-term use    eliquis    Anxiety    Arthralgia of multiple joints    Arthritis pain    Benign hypertensive heart disease    CAD (coronary artery disease) primary cardiologist-- dr danelle taylor   MI and 2 stents 1995 in [place]    Cancer [facility])    Cataract ?   Surgery 2020   CHF NYHA class II, chronic, systolic (HCC)    followed by dr danelle taylor   Complication of anesthesia    Degenerative disc disease, lumbar    Depression    Difficult intubation    Dyslipidemia    History of  basal cell carcinoma (BCC) excision    right ankle area s/p  excision in office 06/ 2019  in office   History of DVT of lower extremity yrs ago before 2012   History of MI (myocardial infarction) 1995  in [place]   History of pulmonary embolus (PE) 2012   History of ventricular tachycardia    Hyperlipidemia    Hypersomnia    Hypertension    Ischemic cardiomyopathy    Migraines    Myocardial infarction (HCC)    OA (osteoarthritis)    all over   PAF (paroxysmal atrial fibrillation) (HCC)    Pneumonia    02/2023   Primary localized osteoarthritis of right hip 12/18/2017   Primary localized osteoarthritis of right knee 05/28/2018   S/P coronary artery stent placement 1995   in [facility]   05-21-2018 per pt x2  stents in same coronary artery (unsure BM or DES)   Vitamin D  deficiency disease    Social History    Socioeconomic History   Marital status: Single    Spouse name: Not on file   Number of children: 0   Years of education: 14   Highest education level: Not on file  Occupational History   Not on file  Tobacco Use   Smoking status: Former    Current packs/day: 0.00    Types: Cigarettes    Start date: 05/21/1966    Quit date: 05/21/1996    Years since quitting: 28.1   Smokeless tobacco: Never  Vaping Use   Vaping status: Never Used  Substance and Sexual Activity   Alcohol use: No   Drug use: Never   Sexual activity: Not on file  Other Topics Concern   Not on file  Social History Narrative   Not on file   Social Drivers of Health   Tobacco Use: Medium Risk (07/23/2024)   Patient History    Smoking Tobacco Use: Former    Smokeless Tobacco Use: Never    Passive Exposure: Not on Actuary Strain: Not on file  Food Insecurity: No Food Insecurity (07/18/2024)   Epic    Worried About Programme Researcher, Broadcasting/film/video in the Last Year: Never true    Ran Out of Food in the Last Year: Never true  Transportation Needs: No Transportation Needs (07/18/2024)   Epic    Lack of Transportation (Medical): No    Lack of Transportation (Non-Medical): No  Physical Activity: Not on file  Stress: Not on file  Social Connections: Moderately Integrated (07/18/2024)   Social Connection and Isolation Panel    Frequency of Communication with Friends and Family: More than three times a week    Frequency of Social Gatherings with Friends and Family: More than three times a week    Attends Religious Services: More than 4 times per year    Active Member of Clubs or Organizations: Yes    Attends Banker Meetings: More than 4 times per year    Marital Status: Divorced  Depression (PHQ2-9): Low Risk (06/04/2024)   Depression (PHQ2-9)    PHQ-2 Score: 0  Alcohol Screen: Not on file  Housing: Low Risk (07/18/2024)   Epic    Unable to Pay for Housing in the Last Year: No    Number of Times  Moved in the Last Year: 0    Homeless in the Last Year: No  Utilities: Not At Risk (07/18/2024)   Epic    Threatened with loss of utilities: No  Health Literacy:  Not on file   Family History  Problem Relation Age of Onset   Hypertension Mother    Thyroid  disease Mother    Alzheimer's disease Mother    Hearing loss Mother    Coronary artery disease Father    Pulmonary embolism Father    Arthritis Father    Heart disease Father    Congestive Heart Failure Maternal Grandmother    Hypertension Maternal Grandmother    Heart attack Maternal Grandfather    Other Maternal Grandfather        carotid disease   Dementia Paternal Grandmother    Other Paternal Grandfather 51       accident   Cancer Sister    Scheduled Meds:  Chlorhexidine  Gluconate Cloth  6 each Topical Daily   lidocaine   1 patch Transdermal Daily   mouth rinse  15 mL Mouth Rinse Q2H   pantoprazole  (PROTONIX ) IV  40 mg Intravenous Q24H   polyethylene glycol  17 g Per Tube Daily   senna  1 tablet Per Tube BID   traZODone   50 mg Oral QHS   Continuous Infusions:  ampicillin -sulbactam (UNASYN ) IV Stopped (07/24/24 0630)   norepinephrine  (LEVOPHED ) Adult infusion 5 mcg/min (07/24/24 0800)   PRN Meds:.acetaminophen  **OR** acetaminophen , diphenhydrAMINE , heparin  lock flush, HYDROmorphone  (DILAUDID ) injection, ipratropium-albuterol , labetalol , LORazepam , morphine  CONCENTRATE, mouth rinse, mouth rinse, sodium chloride  flush Allergies[1] Review of Systems  Constitutional:  Positive for activity change, appetite change and fatigue.  Respiratory:  Positive for cough.   Musculoskeletal:  Positive for back pain.  Neurological:  Positive for weakness.    Physical Exam Constitutional:      General: She is not in acute distress.    Appearance: She is ill-appearing.  Pulmonary:     Comments: Significant productive cough Wearing nasal cannula Skin:    General: Skin is warm and dry.  Neurological:     Mental Status: She is  alert and oriented to person, place, and time.  Psychiatric:     Comments: Slightly flat     Vital Signs: BP 138/61   Pulse (!) 108   Temp 98 F (36.7 C) (Oral)   Resp (!) 46   Ht 5' 7 (1.702 m)   Wt 57.3 kg   SpO2 94%   BMI 19.78 kg/m  Pain Scale: 0-10 POSS *See Group Information*: 1-Acceptable,Awake and alert Pain Score: 10-Worst pain ever   SpO2: SpO2: 94 % O2 Device:SpO2: 94 % O2 Flow Rate: .O2 Flow Rate (L/min): 6 L/min  IO: Intake/output summary:  Intake/Output Summary (Last 24 hours) at 07/24/2024 1115 Last data filed at 07/24/2024 0800 Gross per 24 hour  Intake 1652.52 ml  Output 950 ml  Net 702.52 ml    LBM: Last BM Date : 07/19/24 Baseline Weight: Weight: 58.2 kg Most recent weight: Weight: 57.3 kg     Palliative Assessment/Data: PPS 50%     *Please note that this is a verbal dictation therefore any spelling or grammatical errors are due to the Dragon Medical One system interpretation.   I personally spent a total of 95 minutes in the care of the patient today including preparing to see the patient, performing a medically appropriate exam/evaluation, counseling and educating, placing orders, referring and communicating with other health care professionals, documenting clinical information in the EHR, and coordinating care.     Tobey Jama Barnacle, DNP, AGNP-C Palliative Medicine Team [phone] Pager: [phone]     [1]  Allergies Allergen Reactions   Sumatriptan Succinate Other (See Comments)  Chest pain, no triptans, pt states it makes my heart race   "

## 2024-07-24 NOTE — Progress Notes (Addendum)
 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  Dysphagia is noted to be ongoing since last Thanksgiving.  [facility] admitted and GI consulted and patient underwent EGD with dilatation but unfortunately unsuccessful due to significant stenosis/narrowing of the esophagus.  Patient was planned to receive a feeding tube-PEG for palliative/comfort feeding but patient decided to not pursue.  However esophageal stent was offered and patient accepted to try esophageal stent versus PEG tube placement.  Esophageal stent was placed on 1/14 within the endoscopy suite, unfortunately patient aspirated severely and required to remain on mechanical ventilation status post esophageal stent.  PCCM was consulted via [facility] for further management while patient is on mechanical ventilator.  Patient transferred to ICU for further management and care.  Per anesthesia report-patient had significant white thick secretions in back of oropharynx Initially obstructing ability to place esophageal stent.  This was suctioned and removed but ultimately patient had aspirated and became hypoxic requiring to remain mechanically intubated.  Pertinent  Medical History   Past Medical History:  Diagnosis Date   AICD (automatic cardioverter/defibrillator) present dual   Medtronic - original placed 2007, generator change 2014, generator extraction 03/14/2024   Anemia    Anticoagulant  long-term use    eliquis    Anxiety    Arthralgia of multiple joints    Arthritis pain    Benign hypertensive heart disease    CAD (coronary artery disease) primary cardiologist-- dr danelle taylor   MI and 2 stents 1995 in [place]    Cancer [facility])    Cataract ?   Surgery 2020   CHF NYHA class II, chronic, systolic (HCC)    followed by dr danelle taylor   Complication of anesthesia    Degenerative disc disease, lumbar    Depression    Difficult intubation    Dyslipidemia    History of basal cell carcinoma (BCC) excision    right ankle area s/p  excision in office 06/ 2019  in office   History of DVT of lower extremity yrs ago before 2012   History of MI (myocardial infarction) 1995  in [place]   History of pulmonary embolus (PE) 2012   History of ventricular tachycardia    Hyperlipidemia    Hypersomnia    Hypertension    Ischemic cardiomyopathy    Migraines    Myocardial infarction (HCC)    OA (osteoarthritis)    all over   PAF (paroxysmal atrial fibrillation) (HCC)    Pneumonia    02/2023   Primary localized osteoarthritis of right hip 12/18/2017   Primary localized osteoarthritis of right knee 05/28/2018   S/P coronary artery stent placement 1995   in [facility]   05-21-2018 per pt x2  stents in same coronary artery (unsure BM or DES)   Vitamin D  deficiency disease      Significant Hospital Events: Including procedures, antibiotic start and stop dates in addition to other pertinent events   1/8 admit 1/14 underwent EGD with  stent placement.  Aspirated during procedure and intubated.  PCCM consulted   Interim History / Subjective:   More awake today On minimal vent setting  Objective    Blood pressure 117/64, pulse 97, temperature 98.4 F (36.9 C), temperature source Axillary, resp. rate 20, height 5' 7 (1.702 m), weight 57.3 kg, SpO2 98%.    Vent Mode: PRVC FiO2 (%):  [50 %-100 %] 50 % Set Rate:  [15 bmp-20 bmp] 20 bmp Vt Set:  [490 mL] 490 mL PEEP:  [5 cmH20] 5  cmH20 Plateau Pressure:  [18 cmH20-21 cmH20] 18 cmH20   Intake/Output Summary (Last 24 hours) at 07/24/2024 0745 Last data filed at 07/24/2024 9360 Gross per 24 hour  Intake 2131.63 ml  Output 950 ml  Net 1181.63 ml   Filed Weights   07/17/24 1621 07/18/24 1540  Weight: 58.2 kg 57.3 kg    Examination: Somnolent, arousable Lungs with scattered rhonchi Heart rate regular rate and rhythm Abdomen soft, positive bowel sound Extremities with no edema  Lab/imaging reviewed Significant for BUN/creatinine 6/0.59 WBC 13.7, hemoglobin 15.4 Chest x-ray with left basilar opacity.   Resolved problem list   Assessment and Plan  Acute hypoxic respiratory failure in setting of aspiration status post esophageal stent placement Squamous cell carcinoma of right upper lobe lung with metastatic disease- medically managed by MD Onesimo  History of non-Hodgkin's lymphoma s/p R-CHOP (2024) Intubated on 1/14-history of difficult intubation P: Follow tracheal aspirate, continue Unasyn  Start pressure support weans and plan for extubation today when more awake Status post palliative esophageal stent Currently was on durvalumab - but dc'd per oncology's note, plan for patient to go home with hospice   Esophageal malignant stenosis Dysphagia Severe protein malnutrition in setting of metastatic disease Original plan was to get feeding tube for comfort feedings but patient opted to not undergo PEG placement but opted to receive esophageal stent. P: Holding off tube feeds Swallow eval after extubation  Acute on chronic systolic heart failure CAD Paroxysmal A-fib Hypertension HLD EF 50%, LV low normal function, regional wall abnormalities noted but unchanged, grade 1 diastolic dysfunction, RV systolic function normal, mild elevation in pulmonary artery systolic pressure, trivial MV regurgitation P: Continue cardiac telemetry Hold antihypertensives p.o. antihypertensives in setting of setting of  esophageal stent placement As needed hydralazine  every 6hrs  SCDs Resume statin and eliquis  when able to tolerate p.o. meds  Thrombocytopenia -Suspect related to cancer Plts 118 P: Continue to trend CBC Monitor for signs of bleeding   Critical care time:    The patient is critically ill with multiple organ system failure and requires high complexity decision making for assessment and support, frequent evaluation and titration of therapies, advanced monitoring, review of radiographic studies and interpretation of complex data.   Critical Care Time devoted to patient care services, exclusive of separately billable procedures, described in this note is 35 minutes.   Brennah Quraishi MD [facility] See Amion for pager  If no response to pager , please call [phone] until 7pm After 7:00 pm call Elink  [phone] 07/24/2024, 9:14 AM        "

## 2024-07-24 NOTE — Progress Notes (Addendum)
 eLink Physician-Brief Progress Note Patient Name: Rachel Bates DOB: [birth year] MRN: [id number]   Date of Service  07/24/2024  HPI/Events of Note  Notified of family request to leave foley in place.  The patient is going home with hospice.   eICU Interventions  Ok to leave foley for comfort.      Intervention Category Intermediate Interventions: Other:  Shanda Busman 07/24/2024, 7:56 PM  4:47 AM Notified of patient complaint of generalized pain despite dilaudid  1mg  IV q1hr.  RN had to restart levophed  to support BP while trying to control her pain.   Plan> Give ordered PO morphine .

## 2024-07-24 NOTE — Plan of Care (Signed)
" °  Problem: Elimination: Goal: Will not experience complications related to urinary retention Outcome: Progressing   Problem: Pain Managment: Goal: General experience of comfort will improve and/or be controlled Outcome: Progressing   Problem: Safety: Goal: Ability to remain free from injury will improve Outcome: Progressing   Problem: Skin Integrity: Goal: Risk for impaired skin integrity will decrease Outcome: Progressing   Problem: Safety: Goal: Non-violent Restraint(s) Outcome: Progressing   "

## 2024-07-25 ENCOUNTER — Inpatient Hospital Stay

## 2024-07-25 ENCOUNTER — Encounter (HOSPITAL_COMMUNITY): Payer: Self-pay | Admitting: Gastroenterology

## 2024-07-25 DIAGNOSIS — R112 Nausea with vomiting, unspecified: Secondary | ICD-10-CM

## 2024-07-25 DIAGNOSIS — Z515 Encounter for palliative care: Secondary | ICD-10-CM

## 2024-07-25 DIAGNOSIS — K5900 Constipation, unspecified: Secondary | ICD-10-CM

## 2024-07-25 DIAGNOSIS — G893 Neoplasm related pain (acute) (chronic): Secondary | ICD-10-CM

## 2024-07-25 DIAGNOSIS — Z79899 Other long term (current) drug therapy: Secondary | ICD-10-CM

## 2024-07-25 DIAGNOSIS — K222 Esophageal obstruction: Secondary | ICD-10-CM | POA: Diagnosis not present

## 2024-07-25 LAB — GLUCOSE, CAPILLARY
Glucose-Capillary: 110 mg/dL — ABNORMAL HIGH (ref 70–99)
Glucose-Capillary: 110 mg/dL — ABNORMAL HIGH (ref 70–99)

## 2024-07-25 MED ORDER — SENNA 8.6 MG PO TABS
2.0000 | ORAL_TABLET | Freq: Two times a day (BID) | ORAL | Status: DC
  Administered 2024-07-25: 17.2 mg via ORAL

## 2024-07-25 MED ORDER — BISACODYL 10 MG RE SUPP: 10.0000 mg | Freq: Every day | RECTAL | Status: DC | PRN

## 2024-07-25 MED ORDER — OXYCODONE HCL 5 MG PO TABS
15.0000 mg | ORAL_TABLET | ORAL | Status: DC | PRN
  Administered 2024-07-25: 20 mg via ORAL
  Administered 2024-07-25 (×2): 15 mg via ORAL
  Filled 2024-07-25: qty 3
  Filled 2024-07-25: qty 4
  Filled 2024-07-25: qty 3

## 2024-07-25 MED ORDER — ONDANSETRON HCL 4 MG/2ML IJ SOLN
4.0000 mg | Freq: Four times a day (QID) | INTRAMUSCULAR | Status: DC | PRN
  Administered 2024-07-25 – 2024-07-26 (×2): 4 mg via INTRAVENOUS
  Filled 2024-07-25 (×2): qty 2

## 2024-07-25 MED ORDER — LORAZEPAM 2 MG/ML PO CONC
0.2500 mg | ORAL | Status: DC | PRN
  Filled 2024-07-25: qty 0.25

## 2024-07-25 MED ORDER — ONDANSETRON 4 MG PO TBDP: 4.0000 mg | ORAL_TABLET | Freq: Four times a day (QID) | ORAL | Status: DC | PRN

## 2024-07-25 MED ORDER — MIDODRINE HCL 5 MG PO TABS
10.0000 mg | ORAL_TABLET | Freq: Three times a day (TID) | ORAL | Status: DC
  Administered 2024-07-25 (×2): 10 mg via ORAL
  Filled 2024-07-25 (×2): qty 2

## 2024-07-25 MED ORDER — MIDODRINE HCL 5 MG PO TABS
15.0000 mg | ORAL_TABLET | Freq: Three times a day (TID) | ORAL | Status: DC
  Administered 2024-07-25: 15 mg via ORAL

## 2024-07-25 MED ORDER — OXYCODONE HCL 5 MG PO TABS
5.0000 mg | ORAL_TABLET | ORAL | Status: DC | PRN
  Administered 2024-07-25: 10 mg via ORAL
  Filled 2024-07-25: qty 2

## 2024-07-25 MED ORDER — LORAZEPAM 2 MG/ML PO CONC: 0.2500 mg | ORAL | Status: DC | PRN

## 2024-07-25 NOTE — Progress Notes (Signed)
 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  This has been going on since last Thanksgiving.  GI consulted, underwent an EGD with dilatation, unsuccessful as she had persistent narrowing.  Eventually underwent palliative stent placement on 1/14, remained intubated afterwards, successfully extubated 1/15 and back on the hospitalist service 1/16  Subjective / 24h Interval events: She reports some epigastric discomfort but overall doing well.  Still on pressors today  Assesement and Plan: Principal problem Malignant esophageal stenosis, severe protein calorie malnutrition, postprocedural shock, NOS-with significant weight loss in the last 5 weeks, about 20 pounds.  GI consulted, underwent an EGD that showed likely extrinsic narrowing and dilatation was attempted.  Follow-up esophagogram showed persistent narrowing - Discussions were held for PEG tube, however a stent has been offered only for palliation and if she is not getting further treatment.  After discussing with the gastroenterology team, patient elected to have a stent placed rather than a PEG tube.  Stent was placed 1/14.  With stent placement she will forego further cancer treatment.  We are aiming for her to go home with hospice, however still remains on pressors this morning following her intubation/ICU stay.  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 Onesimo as an outpatient.  CT on  admission showed new nodularities in the lung and hypodensities in the liver concerning for metastatic disease.  She got a palliative esophageal stent, no further treatment.  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   History of non-Hodgkin's lymphoma-status post R-CHOP in 2024  Leukopenia, thrombocytopenia-cancer related, stable  Scheduled Meds:  apixaban   5 mg Oral BID   Chlorhexidine  Gluconate Cloth  6 each Topical Daily   feeding supplement  1 Container Oral TID BM   lidocaine   1 patch Transdermal Daily   midodrine   10 mg Oral TID WC   pantoprazole  (PROTONIX ) IV  40 mg Intravenous Q24H   polyethylene glycol  17 g Oral Daily   senna  1 tablet Oral BID   traZODone   50 mg Oral QHS   Continuous Infusions:  ampicillin -sulbactam (UNASYN ) IV Stopped (07/25/24 0500)   norepinephrine  (LEVOPHED ) Adult infusion 7 mcg/min (07/25/24 0915)   PRN Meds:.acetaminophen  **OR** acetaminophen , diphenhydrAMINE , heparin  lock flush, ipratropium-albuterol , labetalol , LORazepam , morphine  CONCENTRATE, oxyCODONE , sodium chloride  flush  Current Outpatient Medications  Medication Instructions   apixaban  (ELIQUIS ) 5 mg, Oral, 2 times daily   carvedilol  (COREG ) 12.5 mg, 2 times daily with meals   cyanocobalamin  (VITAMIN B12) 1,000 mcg, Daily   cyclobenzaprine (FLEXERIL) 5 mg, Oral, 3 times daily PRN   dexamethasone  (DECADRON ) 4 MG tablet Take 2 tabs by mouth 2 times daily starting day before chemo. Then take 2 tabs daily for 2 days starting day after chemo. Take with food.   gabapentin  (NEURONTIN ) 300 mg, 3 times  daily   HYDROcodone -acetaminophen  (NORCO/VICODIN) 5-325 MG tablet 1 tablet, Oral, Every 6 hours PRN   ipratropium (ATROVENT  HFA) 17 MCG/ACT inhaler 2 puffs,  Inhalation, Every 6 hours PRN   lidocaine -prilocaine  (EMLA ) cream Apply to affected area once   loratadine  (CLARITIN ) 10 mg, Daily   ondansetron  (ZOFRAN ) 8 mg, Oral, Every 8 hours PRN   prochlorperazine  (COMPAZINE ) 10 mg, Oral, Every 6 hours PRN   ramipril  (ALTACE ) 2.5 mg, Daily   simvastatin  (ZOCOR ) 40 mg, Oral, Daily   traZODone  (DESYREL ) 50 mg, Daily at bedtime   zonisamide  (ZONEGRAN ) 50-100 mg, See admin instructions    Diet Orders (From admission, onward)     Start     Ordered   07/24/24 1845  Diet clear liquid Room service appropriate? Yes; Fluid consistency: Nectar Thick  Diet effective now       Question Answer Comment  Room service appropriate? Yes   Fluid consistency: Nectar Thick      07/24/24 1844            DVT prophylaxis: Place and maintain sequential compression device Start: 07/17/24 2011 apixaban  (ELIQUIS ) tablet 5 mg   Lab Results  Component Value Date   PLT 204 07/24/2024      Code Status: Limited: Do not attempt resuscitation (DNR) -DNR-LIMITED -Do Not Intubate/DNI   Family Communication: Daughter present at bedside  Status is: Inpatient Remains inpatient appropriate because: Severity of illness   Level of care: ICU  Consultants:  Oncology Gastroenterology Palliative  Objective: Vitals:   07/25/24 0930 07/25/24 0945 07/25/24 1000 07/25/24 1015  BP: (!) 132/49 123/67 (!) 118/58 (!) 110/49  Pulse: 98 100 98 99  Resp: 14 13 (!) 22 18  Temp:      TempSrc:      SpO2: 97% 97% 97% 98%  Weight:      Height:        Intake/Output Summary (Last 24 hours) at 07/25/2024 1034 Last data filed at 07/25/2024 0915 Gross per 24 hour  Intake 730.98 ml  Output 750 ml  Net -19.02 ml   Wt Readings from Last 3 Encounters:  07/18/24 57.3 kg  07/17/24 58.2 kg  06/04/24 63.6 kg    Examination:  Constitutional: NAD Eyes: lids and conjunctivae normal, no scleral icterus ENMT: mmm Neck: normal, supple Respiratory: clear to auscultation  bilaterally, no wheezing, no crackles. Normal respiratory effort.  Cardiovascular: Regular rate and rhythm, no murmurs / rubs / gallops. No LE edema. Abdomen: soft, no distention, no tenderness. Bowel sounds positive.   Data Reviewed: I have independently reviewed following labs and imaging studies   CBC Recent Labs  Lab 07/19/24 1035 07/20/24 0658 07/21/24 0445 07/23/24 0443 07/24/24 0549  WBC 3.8* 3.5* 3.4* 4.0 13.7*  HGB 12.9 13.0 12.8 13.5 15.4*  HCT 39.1 39.8 39.0 40.8 47.2*  PLT 106* 111* 116* 118* 204  MCV 93.1 93.4 94.7 94.2 93.8  MCH 30.7 30.5 31.1 31.2 30.6  MCHC 33.0 32.7 32.8 33.1 32.6  RDW 13.8 13.7 13.6 13.7 13.9  LYMPHSABS  --   --   --  0.4*  --   MONOABS  --   --   --  0.5  --   EOSABS  --   --   --  0.1  --   BASOSABS  --   --   --  0.0  --     Recent Labs  Lab 07/19/24 1035 07/20/24 0658 07/21/24 0445 07/23/24 0443 07/24/24 0549  NA 139 140 140 139  138  K 4.0 4.6 4.1 4.0 4.6  CL 104 104 105 106 104  CO2 27 29 28 25 23   GLUCOSE 92 94 104* 102* 143*  BUN 6* <5* 5* <5* 6*  CREATININE 0.55 0.56 0.48 0.46 0.59  CALCIUM  9.3 9.9 9.6 9.4 10.0  AST  --   --   --  18 19  ALT  --   --   --  10 10  ALKPHOS  --   --   --  87 96  BILITOT  --   --   --  0.9 1.4*  ALBUMIN   --   --   --  3.4* 3.5  MG  --   --   --   --  1.8    ------------------------------------------------------------------------------------------------------------------ Recent Labs    07/24/24 0549  TRIG 109    No results found for: HGBA1C ------------------------------------------------------------------------------------------------------------------ No results for input(s): TSH, T4TOTAL, T3FREE, THYROIDAB in the last 72 hours.  Invalid input(s): FREET3  Cardiac Enzymes No results for input(s): CKMB, TROPONINI, MYOGLOBIN in the last 168 hours.  Invalid input(s):  CK ------------------------------------------------------------------------------------------------------------------    Component Value Date/Time   BNP 322.0 (H) 02/15/2023 0530    CBG: Recent Labs  Lab 07/24/24 1142 07/24/24 1536 07/24/24 2008 07/24/24 2358 07/25/24 0816  GLUCAP 100* 114* 100* 110* 110*    Recent Results (from the past 240 hours)  Resp panel by RT-PCR (RSV, Flu A&B, Covid) Anterior Nasal Swab     Status: None   Collection Time: 07/17/24  5:55 PM   Specimen: Anterior Nasal Swab  Result Value Ref Range Status   SARS Coronavirus 2 by RT PCR NEGATIVE NEGATIVE Final    Comment: (NOTE) SARS-CoV-2 target nucleic acids are NOT DETECTED.  The SARS-CoV-2 RNA is generally detectable in upper respiratory specimens during the acute phase of infection. The lowest concentration of SARS-CoV-2 viral copies this assay can detect is 138 copies/mL. A negative result does not preclude SARS-Cov-2 infection and should not be used as the sole basis for treatment or other patient management decisions. A negative result may occur with  improper specimen collection/handling, submission of specimen other than nasopharyngeal swab, presence of viral mutation(s) within the areas targeted by this assay, and inadequate number of viral copies(<138 copies/mL). A negative result must be combined with clinical observations, patient history, and epidemiological information. The expected result is Negative.  Fact Sheet for Patients:  bloggercourse.com  Fact Sheet for Healthcare Providers:  seriousbroker.it  This test is no t yet approved or cleared by the [place]  FDA and  has been authorized for detection and/or diagnosis of SARS-CoV-2 by FDA under an Emergency Use Authorization (EUA). This EUA will remain  in effect (meaning this test can be used) for the duration of the COVID-19 declaration under Section 564(b)(1) of the Act,  21 U.S.C.section 360bbb-3(b)(1), unless the authorization is terminated  or revoked sooner.       Influenza A by PCR NEGATIVE NEGATIVE Final   Influenza B by PCR NEGATIVE NEGATIVE Final    Comment: (NOTE) The Xpert Xpress SARS-CoV-2/FLU/RSV plus assay is intended as an aid in the diagnosis of influenza from Nasopharyngeal swab specimens and should not be used as a sole basis for treatment. Nasal washings and aspirates are unacceptable for Xpert Xpress SARS-CoV-2/FLU/RSV testing.  Fact Sheet for Patients: bloggercourse.com  Fact Sheet for Healthcare Providers: seriousbroker.it  This test is not yet approved or cleared by the [place]  FDA and has been authorized for detection and/or  diagnosis of SARS-CoV-2 by FDA under an Emergency Use Authorization (EUA). This EUA will remain in effect (meaning this test can be used) for the duration of the COVID-19 declaration under Section 564(b)(1) of the Act, 21 U.S.C. section 360bbb-3(b)(1), unless the authorization is terminated or revoked.     Resp Syncytial Virus by PCR NEGATIVE NEGATIVE Final    Comment: (NOTE) Fact Sheet for Patients: bloggercourse.com  Fact Sheet for Healthcare Providers: seriousbroker.it  This test is not yet approved or cleared by the [place]  FDA and has been authorized for detection and/or diagnosis of SARS-CoV-2 by FDA under an Emergency Use Authorization (EUA). This EUA will remain in effect (meaning this test can be used) for the duration of the COVID-19 declaration under Section 564(b)(1) of the Act, 21 U.S.C. section 360bbb-3(b)(1), unless the authorization is terminated or revoked.  Performed at [facility], [address], [place], [place] [address]   Urine Culture     Status: Abnormal   Collection Time: 07/17/24  7:30 PM   Specimen: Urine, Random  Result Value Ref  Range Status   Specimen Description   Final    URINE, RANDOM Performed at [facility], [address], [place], [place] [address]    Special Requests   Final    NONE Reflexed from [id number] Performed at [facility], [address], [place], [place] [address]    Culture (A)  Final    50,000 COLONIES/mL MULTIPLE SPECIES PRESENT, SUGGEST RECOLLECTION   Report Status 07/19/2024 FINAL  Final  Culture, Respiratory w Gram Stain     Status: None (Preliminary result)   Collection Time: 07/23/24 12:28 AM   Specimen: Tracheal Aspirate; Respiratory  Result Value Ref Range Status   Specimen Description   Final    TRACHEAL ASPIRATE Performed at [facility], [address], [place], [place] [address]    Special Requests   Final    NONE Performed at [facility], [address] [place], [place] [address]    Gram Stain   Final    FEW WBC PRESENT, PREDOMINANTLY PMN FEW GRAM POSITIVE COCCI RARE GRAM NEGATIVE RODS RARE YEAST Performed at [facility], [address] [place], [place] [address]    Culture PENDING  Incomplete   Report Status PENDING  Incomplete  MRSA Next Gen by PCR, Nasal     Status: None   Collection Time: 07/23/24  3:41 PM   Specimen: Nasal Mucosa; Nasal Swab  Result Value Ref Range Status   MRSA by PCR Next Gen NOT DETECTED NOT DETECTED Final    Comment: (NOTE) The GeneXpert MRSA Assay (FDA approved for NASAL specimens only), is one component of a comprehensive MRSA colonization surveillance program. It is not intended to diagnose MRSA infection nor to guide or monitor treatment for MRSA infections. Test performance is not FDA approved in patients less than 75 years old. Performed at [facility], [address] [place], [place] [address]      Radiology Studies: No results found.   Nilda Fendt, MD, PhD [facility]  Between 7 am - 7 pm I am available,  please contact me via Amion (for emergencies) or Securechat (non urgent messages)  Between 7 pm - 7 am I am not available, please contact night coverage MD/APP via Amion  "

## 2024-07-25 NOTE — Progress Notes (Signed)
 [facility]   This pt was referred for hospice care at home. I have been able to connect with the daughter to discuss hospice care at home. They are receptive to this. They currently do not feel that any equipment is needed as the pt has an adjustable bed at home (although prefers the recliner) walker and cane. We discuss possible oxygen need and the daughter is hopeful that they will be able to wean her off the oxygen and she will not need this. We will continue to follow and assist in equipment needs if there are needs upon d/c.    The pt has been approved to go home with hospice services when stable.   Magdalena Berber RN [phone]

## 2024-07-25 NOTE — Plan of Care (Signed)
" °  Problem: Clinical Measurements: Goal: Diagnostic test results will improve Outcome: Progressing Goal: Respiratory complications will improve Outcome: Progressing Goal: Cardiovascular complication will be avoided Outcome: Progressing   Problem: Elimination: Goal: Will not experience complications related to urinary retention Outcome: Progressing   Problem: Pain Managment: Goal: General experience of comfort will improve and/or be controlled Outcome: Progressing   Problem: Nutrition: Goal: Adequate nutrition will be maintained Outcome: Not Progressing   "

## 2024-07-25 NOTE — Progress Notes (Signed)
 " Daily Progress Note   Patient Name: Rachel Bates       Date: 07/25/2024 DOB: December 13, 1945  Age: 79 y.o. MRN#: [id number] Attending Physician: Trixie Nilda HERO, MD Primary Care Physician: O'Buch, Greta, PA-C Admit Date: 07/17/2024 Length of Stay: 8 days  Reason for Follow-up: Establishing goals of care and Pain control  Subjective:   CC: Patient feels the as needed oxycodone  dose is not working well enough.  Following up regarding goals of care and pain control.  Reviewed EMR including documentation from hospitalist today.  Patient was evaluated by SLP and started on diet.  Plan is for patient to go home with hospice support once clinically stable.  Unfortunately overnight patient still requiring pressors for hypotension support.  Hospitalist attempting to transition over to midodrine  to get patient off of IV pressors. At time of EMR review in past 24 hours patient has received as needed oral morphine  solution 10 mg x 1 dose and as needed oxycodone  10 mg x 1 dose.  Also noted last BM documented in the EMR was 07/20/2023. Discussed care with hospitalist and bedside RN for medical updates.  Presented to bedside to see patient.  Patient laying in bed and appears uncomfortable with increased work of breathing.  RN present at bedside as well.  Noted patient had just attempted to work with physical therapy which caused worsening of her pain and breathing.  Inquired about patient's symptom burden at this time.  Patient's main concern is the pain in her back which is chronic though acutely worsened and uncontrolled on current medications.  Patient feels that the as needed morphine  does not help with her pain at all.  She feels that the as needed oxycodone  does help with her pain though not enough.  Discussed can increase as needed oxycodone  dose to help with pain management.  Patient agrees with this plan. Spent time answering questions as able and providing emotional support via active listening.  Noted  palliative medicine team will continue to follow along with patient's medical journey.  Discussed care with hospitalist and RN throughout the day to coordinate care.  Patient also having nausea later in day so added medications to assist with management of this.  Objective:   Vital Signs:  BP (!) 132/49   Pulse 98   Temp 99 F (37.2 C) (Oral)   Resp 14   Ht 5' 7 (1.702 m)   Wt 57.3 kg   SpO2 97%   BMI 19.78 kg/m   Physical Exam: General: Alert, grimacing, chronically ill-appearing, frail Cardiovascular: Tachycardia noted Respiratory: increased work of breathing noted, on nasal cannula 6 L/min Abdomen: not distended Neuro: Awake, interactive, appropriately answering questions  Assessment & Plan:   Assessment: Patient is a 79 year old female with a past medical history of squamous cell cancer of the right upper lobe with metastatic disease to lymph nodes around esophagus, history of non-Hodgkin's lymphoma in 2024 s/p R-CHOP, chronic systolic heart failure with ICD (which was removed in September 2025), hypertension, CAD, PAF, chronic back pain, and prior PE on Eliquis  who was admitted on 07/18/2023 for management of recurrent dysphagia.  Patient underwent EGD during admission for dilation though unfortunately this was unsuccessful due to significant stenosis/narrowing of the esophagus.  Esophageal stent was offered and placed on 07/23/2024 though unfortunately patient aspirated severely and required intubation with mechanical ventilation after esophageal stent placement; successfully extubated on 07/24/2024.  Palliative medicine team consulted to assist with goals of care discussions.  Recommendations/Plan: # Complex medical decision  making/goals of care:  - Currently continuing appropriate medical management with hope to stabilize patient to get patient home with hospice when clinically appropriate. [facility] already following to assist with care coordination and hospice  support once patient discharged.  Palliative medicine team continuing to follow with patient's medical journey.  -  Code Status: Limited: Do not attempt resuscitation (DNR) -DNR-LIMITED -Do Not Intubate/DNI   # Symptom management: Patient is receiving these palliative interventions for symptom management with an intent to improve quality of life.   - Pain, acute on chronic pain in the setting of squamous cell cancer of the lung with metastatic disease to lymph nodes around the esophagus and chronic back pain   - Increase oxycodone  to 15-20 mg every 3 hours as needed.  Continue to adjust based on patient's symptom burden.   - Nausea/vomiting, in setting of recent esophageal stent and aspiration requiring intubation   - Start Zofran  4 mg every 6 hours as needed IV/ODT   - Start lorazepam  0.26-0.5 mg every 4 hours as needed   - Constipation in setting of taking his   - Start senna 2 tab twice daily  # Psychosocial Support:  - Daughter  # Discharge Planning: Home with Hospice when clinically appropriate  Discussed with: Patient, RN, hospitalist  Thank you for allowing the palliative care team to participate in the care Devere MARLA Edu.  Tinnie Radar, DO Palliative Care Provider PMT # [phone]  If patient remains symptomatic despite maximum doses, please call PMT at [phone] between 0700 and 1900. Outside of these hours, please call attending, as PMT does not have night coverage.  Billing based on MDM: Moderate  Problems Addressed: One or more chronic illnesses with severe exacerbation, progression, or side effects of treatment.    Risks: Prescription drug management  "

## 2024-07-25 NOTE — Plan of Care (Signed)
" °  Problem: Coping: Goal: Level of anxiety will decrease Outcome: Progressing   Problem: Pain Managment: Goal: General experience of comfort will improve and/or be controlled Outcome: Progressing   Problem: Skin Integrity: Goal: Risk for impaired skin integrity will decrease Outcome: Progressing   Problem: Nutrition: Goal: Adequate nutrition will be maintained Outcome: Not Progressing   "

## 2024-07-25 NOTE — Evaluation (Signed)
 Physical Therapy Brief Evaluation and Discharge Note Patient Details Name: Rachel Bates MRN: [id number] DOB: Oct 15, 1945 Today's Date: 07/25/2024   History of Present Illness  79 yo female presents to therapy following hospital admission on 07/17/2024 due to recommendation of OP oncologist due to increased difficulty with swallowing over the past couple of weeks with significant weight loss and decreased household mobility. Pt reports some abdominal pain with nausea. Pt PMH includes but is not limited to: CHF, Afib, DVT, PE, lung ca, B-cell/non-hodgkin's lymphoma, AICD in situ, anemia, anxiety, arthritis, CAD, MI, sCHF, HLD, depression, and multiple orthopedic surgeries.  Clinical Impression     Patient evaluated by Physical Therapy with no further acute PT needs identified. All education has been completed and the patient has no further questions. See below for any follow-up Physical Therapy or DME needs. PT is signing off. Thank you for this referral.       PT Assessment Patient does not need any further PT services  Assistance Needed at Discharge  Frequent or constant Supervision/Assistance    Equipment Recommendations BSC/3in1;Rolling walker (2 wheels);Other (comment) (pt to transition to hospice care)  Recommendations for Other Services       Precautions/Restrictions Precautions Precautions: Fall Restrictions Weight Bearing Restrictions Per Provider Order: No        Mobility  Bed Mobility Rolling: Modified independent (Device/Increase time) Supine/Sidelying to sit: Contact guard assist, HOB elevated Sit to supine/sidelying: Min assist General bed mobility comments: CGA and min cues for supine to sit, min A for B LE to return to bed, pt electing to sit in long sit position while in bed  Transfers Overall transfer level: Needs assistance Equipment used: Rolling walker (2 wheels) Transfers: Sit to/from Stand Sit to Stand: Contact guard assist           General  transfer comment: min cues pt able to push to stand and side step to the L x 3 feet. pt limited by productive cough and congestion    Ambulation/Gait              Home Activity Instructions    Stairs            Modified Rankin (Stroke Patients Only)        Balance Overall balance assessment: Mild deficits observed, not formally tested                        Pertinent Vitals/Pain PT - Brief Vital Signs All Vital Signs Stable: No Exception to Vital Signs Stable: pt doffed [place] tube and desaturated to 83% on RA seated EOB, pt encouraged to replace [place] tube and guided through pursed lip breathing, HR elevaed to 123 during therapy eval Pain Assessment Pain Assessment: Faces Faces Pain Scale: Hurts little more Pain Location: back and from coughing Pain Descriptors / Indicators: Discomfort Pain Intervention(s): Monitored during session, Limited activity within patient's tolerance, Repositioned     Home Living Family/patient expects to be discharged to:: Hospice/Palliative care         Home Equipment: None   Additional Comments: pt often sleeps in recliner  in home setting due to back pain, pt lives in a one story home with a small threshold to enter and daughter is close    Prior Function Level of Independence: Independent      UE/LE Assessment        LE ROM/Strength/Tone/Coordination: Generalized weakness      Communication   Communication Communication: No apparent difficulties  Cognition Overall Cognitive Status: Appears within functional limits for tasks assessed/performed       General Comments      Exercises     Assessment/Plan    PT Problem List         PT Visit Diagnosis Muscle weakness (generalized) (M62.81);Unsteadiness on feet (R26.81);Pain    No Skilled PT Other (comment) (pt is to transition to hospice care)   Co-evaluation                AMPAC 6 Clicks Help needed turning from your back to your side  while in a flat bed without using bedrails?: None Help needed moving from lying on your back to sitting on the side of a flat bed without using bedrails?: A Little Help needed moving to and from a bed to a chair (including a wheelchair)?: A Little Help needed standing up from a chair using your arms (e.g., wheelchair or bedside chair)?: A Little Help needed to walk in hospital room?: Total Help needed climbing 3-5 steps with a railing? : Total 6 Click Score: 15      End of Session Equipment Utilized During Treatment: Gait belt;Oxygen Activity Tolerance: Patient limited by fatigue;Other (comment) (cough and congestion) Patient left: in bed;with call bell/phone within reach;with nursing/sitter in room Nurse Communication: Mobility status PT Visit Diagnosis: Muscle weakness (generalized) (M62.81);Unsteadiness on feet (R26.81);Pain Pain - Right/Left:  (back and cough)     Time: 1030-1049 PT Time Calculation (min) (ACUTE ONLY): 19 min  Charges:   PT Evaluation $PT Eval Low Complexity: 1 Low      [place], PT Acute Rehab   [place] VEAR Drone  07/25/2024, 1:41 PM

## 2024-07-25 NOTE — Progress Notes (Signed)
 Nutrition Follow-up  DOCUMENTATION CODES:   Severe malnutrition in context of chronic illness  INTERVENTION:  - Clear Liquid diet per MD.   - Patient's family have brought in strawberry flavored ONS for patient to consume if desired.   Plan for patient to discharge home with hospice once able to come off pressors. No further nutrition interventions planned at this time, will sign off. Please re-consult if needed.   NUTRITION DIAGNOSIS:   Severe Malnutrition related to chronic illness, dysphagia as evidenced by severe fat depletion, severe muscle depletion, percent weight loss (18% in < 5 months). *ongoing  GOAL:   Patient will meet greater than or equal to 90% of their needs *not met  MONITOR:   Diet advancement, Labs, Weight trends, TF tolerance  REASON FOR ASSESSMENT:   Consult Assessment of nutrition requirement/status  ASSESSMENT:   79 y.o. 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 Admitted for malignant esophageal stenosis.  Patient decided against PEG placement and so a stent was placed for palliation as patient has decided to not get any further cancer treatment.   Patient had stent placed 1/14. Currently on a clear liquid diet. Family has brought in strawberry flavored ONS for patient to consume if desired as this is the only flavor she likes.   Once patient off of Levophed  plan is for her to discharge home with hospice. No further nutrition interventions planned at this time, will sign off.     Medications reviewed and include: Miralax , Senokot Levophed  @ 4 mcg/min   Labs reviewed:  -  Diet Order:   Diet Order             Diet clear liquid Room service appropriate? Yes; Fluid consistency: Nectar Thick  Diet effective now                   EDUCATION NEEDS:  Education  needs have been addressed  Skin:  Skin Assessment: Reviewed RN Assessment  Last BM:  1/10  Height:  Ht Readings from Last 1 Encounters:  07/23/24 5' 7 (1.702 m)   Weight:  Wt Readings from Last 1 Encounters:  07/18/24 57.3 kg   Ideal Body Weight:  61.36 kg  BMI:  Body mass index is 19.78 kg/m.  Estimated Nutritional Needs:  Kcal:  1700-1900 kcals Protein:  70-85 grams Fluid:  >/= 1.7L    Trude Ned RD, LDN Contact via Secure Chat.

## 2024-07-26 DIAGNOSIS — Z7189 Other specified counseling: Secondary | ICD-10-CM

## 2024-07-26 DIAGNOSIS — K222 Esophageal obstruction: Secondary | ICD-10-CM | POA: Diagnosis not present

## 2024-07-26 DIAGNOSIS — Z711 Person with feared health complaint in whom no diagnosis is made: Secondary | ICD-10-CM

## 2024-07-26 DIAGNOSIS — Z79899 Other long term (current) drug therapy: Secondary | ICD-10-CM

## 2024-07-26 DIAGNOSIS — Z66 Do not resuscitate: Secondary | ICD-10-CM

## 2024-07-26 LAB — CULTURE, RESPIRATORY W GRAM STAIN: Culture: NORMAL

## 2024-07-26 MED ORDER — GLYCOPYRROLATE 0.2 MG/ML IJ SOLN: 0.2000 mg | INTRAMUSCULAR | Status: DC | PRN

## 2024-07-26 MED ORDER — HALOPERIDOL LACTATE 5 MG/ML IJ SOLN: 1.0000 mg | INTRAMUSCULAR | Status: DC | PRN

## 2024-07-26 MED ORDER — LORAZEPAM 2 MG/ML IJ SOLN: 0.5000 mg | INTRAMUSCULAR | Status: DC | PRN

## 2024-07-26 MED ORDER — HYDROMORPHONE HCL 1 MG/ML IJ SOLN
1.0000 mg | INTRAMUSCULAR | Status: DC | PRN
  Administered 2024-07-26: 2 mg via INTRAVENOUS
  Administered 2024-07-26: 1 mg via INTRAVENOUS
  Filled 2024-07-26: qty 1
  Filled 2024-07-26: qty 2

## 2024-07-26 MED ORDER — BIOTENE DRY MOUTH MT LIQD: 15.0000 mL | OROMUCOSAL | Status: DC | PRN

## 2024-07-26 MED ORDER — POLYVINYL ALCOHOL 1.4 % OP SOLN: 1.0000 [drp] | Freq: Four times a day (QID) | OPHTHALMIC | Status: DC | PRN

## 2024-07-26 MED ORDER — LORAZEPAM 2 MG/ML IJ SOLN
0.5000 mg | INTRAMUSCULAR | Status: DC | PRN
  Administered 2024-07-26: 1 mg via INTRAVENOUS
  Filled 2024-07-26: qty 1

## 2024-07-26 MED ORDER — LORAZEPAM 2 MG/ML IJ SOLN: 1.0000 mg | INTRAMUSCULAR | Status: DC | PRN

## 2024-07-26 NOTE — TOC Progression Note (Addendum)
 Transition of Care [facility]) - Progression Note    Patient Details  Name: ADYA WIRZ MRN: [id number] Date of Birth: 04/22/1946  [facility]) CM/SW Contact  Alfonse JONELLE Rex, RN Phone Number: 07/26/2024, 9:26 AM  Clinical Narrative:  Teams chat received from  Palliative Medicine Team - patient now full comfort care, family requesting inpatient hospice in [facility]  through [place] [facility] [place].   NCM  called to [facility], referral made for residential hospice, await call back from weekend  nurse   -12:15pm Call received from [place] with [facility], reports approved for [facility], bed available today, team notified. PTAR for transport.       Barriers to Discharge: Continued Medical Work up               Expected Discharge Plan and Services                                               Social Drivers of Health (SDOH) Interventions SDOH Screenings   Food Insecurity: No Food Insecurity (07/18/2024)  Housing: Low Risk (07/18/2024)  Transportation Needs: No Transportation Needs (07/18/2024)  Utilities: Not At Risk (07/18/2024)  Depression (PHQ2-9): Low Risk (06/04/2024)  Social Connections: Moderately Integrated (07/18/2024)  Tobacco Use: Medium Risk (07/23/2024)    Readmission Risk Interventions     No data to display

## 2024-07-26 NOTE — Progress Notes (Signed)
 " Daily Progress Note   Patient Name: Rachel Bates       Date: 07/26/2024 DOB: 12-31-1945  Age: 79 y.o. MRN#: [id number] Attending Physician: Trixie Nilda HERO, MD Primary Care Physician: Venancio Pock, PA-C Admit Date: 07/17/2024 Length of Stay: 9 days  Reason for Follow-up: Establishing goals of care and Pain control  Subjective:   CC: Patient notes inability to swallow at this time.  Following up regarding symptom management and goals of care.  Discussed care with hospitalist and bedside RN for medical updates.  Patient being weaned off of Levophed  at this time.  Patient refusing oral medications due to sensation of choking.  Patient still having uncontrolled pain since could not regularly take pain medications orally.  Presented to bedside to see patient.  Patient laying in bed, appears ill and grimacing at times.  Patient notes pain in her back and with swallowing.  Patient also noted to be retching while at bedside and notes continued sensation of being nauseated. *Please see separate advance care planning/IPAL note for full discussion with patient and daughter.  Result of conversation is transitioning to full comfort focused care at this time and seeking inpatient hospice placement.*  Discussed care with hospitalist, RN, and TOC to coordinate transition to full comfort focused care at this time.  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  Patient underwent EGD during admission for dilation though unfortunately this was unsuccessful due to significant stenosis/narrowing of the esophagus.  Esophageal stent was offered and placed on 07/23/2024 though unfortunately patient aspirated severely and required intubation with mechanical ventilation after esophageal stent placement; successfully extubated on 07/24/2024.  Palliative medicine team consulted to assist with goals of care discussions.  Recommendations/Plan: # Complex medical decision making/goals of care:  - Discussed care with patient and daughter at bedside.  Transitioning to full comfort focused care at this time.  Seeking inpatient hospice placement in [place] for [facility].  TOC assisting with coordination of care.  Awaiting evaluation.  Palliative medicine team continuing to follow along with patient's medical journey.  -At this time we will discontinue interventions that are no longer focused on comfort such as IV fluids, imaging, or lab work.  Will instead focus on symptom management of pain, dyspnea, and agitation in the setting of end-of-life care.    Code Status: Do not attempt resuscitation (DNR) - Comfort care  # Symptom management: Patient is receiving these palliative interventions for symptom management with an intent to improve quality of life.   - Pain, acute on chronic pain in the setting of squamous cell cancer of the lung with metastatic disease  to lymph nodes around the esophagus and chronic back pain; now transitioning to comfort focused care at end-of-life   - Discontinue oral oxycodone    - Start IV Dilaudid  1-2 mg every hour as needed.   Continue to adjust based on patient's symptom burden.  If patient needing frequent doses, may need to consider continuous infusion.   - Nausea/vomiting/anxiety, in setting of recent esophageal stent and aspiration requiring intubation and chronic dysphagia; now transitioning to comfort focused care at end-of-life   - Continue IV Zofran  4 mg every 6 hours as needed   - Start IV lorazepam  at 0.5-1 mg every 4 hours as needed   - Constipation in setting of taking opioids   - Continue bisacodyl  suppository daily as needed   - Respiratory secretions in setting of metastatic lung cancer with associated dysphagia   - Start IV glycopyrrolate  0.2 every 4 hours as needed Discontinuing oral medication since patient could no longer tolerate pills or liquids orally  # Psychosocial Support:  - Daughter  # Discharge Planning: Hospice facility vs in-hospital death.  - Transitioning to full comfort focused care at this time.  TOC assisting with referral for evaluation for inpatient hospice in [facility] through [facility] [place].   Discussed with: Patient, patient's daughter, RN, hospitalist, [facility]  Thank you for allowing the palliative care team to participate in the care Devere MARLA Edu.  Tinnie Radar, DO Palliative Care Provider PMT # [phone]  If patient remains symptomatic despite maximum doses, please call PMT at [phone] between 0700 and 1900. Outside of these hours, please call attending, as PMT does not have night coverage.  Billing based on MDM: High  Problems Addressed: One or more chronic illnesses with severe exacerbation, progression, or side effects of treatment.  Risks: Parenteral controlled substances    "

## 2024-07-26 NOTE — IPAL (Addendum)
 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 Individuals present for the conversation: This provider discussed care with patient and patient's daughter, Elvie, at bedside.  Summary of the conversation:  Presented to bedside and again introduced myself as a member of the palliative medicine team.  During our conversation, patient's daughter presented to bedside as well and was able to participate to determine goals of care.  Discussed that currently patient is off of the Levophed  to be continued her blood pressure so depending on what the goals are, no is the time to consider getting home.  Also discussed that if patient is unable to take oral medications, cannot get IV medications as needed at home so then would need to focus on comfort even here and potentially inpatient hospice.  Did inquire with patient about taking the liquid medication that can be absorbed into her cheek and patient notes she does not feel she can even tolerate this due to sensation of choking.  Acknowledged this.  Spent time answering questions regarding both pathways.  At this time patient and daughter agreed with transitioning to comfort focused care here in the hospital.  Will discontinue oral medications since patient cannot take them, even in liquid form.  Will instead provide IV medications for symptom management.  Family had already chosen [facility] [place] for [facility]ice and would like to work to get patient to inpatient hospice in [place], which [facility].  Noted would involve TOC to assist with care coordination. All  questions answered at that time.  Noted palliative medicine team will continue to follow along with patient's medical journey.   Outcome of the conversations and/or documents completed:  Transition to full comfort focused care at this time.  Involving TOC for referral for inpatient hospice evaluation in [place] through [facility].  I spent 20 minutes providing separately identifiable ACP services with the patient and/or surrogate decision maker in a voluntary, in-person conversation discussing the patient's wishes and goals as detailed in the above note.  Tinnie Radar, DO Palliative Medicine Provider

## 2024-07-26 NOTE — Discharge Summary (Signed)
 "  Physician Discharge Summary  Rachel Bates FMW:[id number] DOB: Jun 09, 1946 DOA: 07/17/2024  PCP: O'Buch, Greta, PA-C  Admit date: 07/17/2024 Discharge date: 07/26/2024  Admitted From: home Disposition:  residential hospice  Discharge Condition: stable CODE STATUS: DNR Diet Orders (From admission, onward)     Start     Ordered   07/24/24 1845  Diet clear liquid Room service appropriate? Yes; Fluid consistency: Nectar Thick  Diet effective now       Question Answer Comment  Room service appropriate? 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  This has been going on since last Thanksgiving.  GI consulted, underwent an EGD with dilatation, unsuccessful as she had persistent narrowing.  Eventually underwent palliative stent placement on 1/14, remained intubated afterwards, successfully extubated 1/15 and back on the hospitalist service 1/16  Hospital Course / Discharge diagnoses: Principal problem Malignant esophageal stenosis, severe protein calorie malnutrition, postprocedural shock, NOS-with significant weight loss in the last 5 weeks, about 20 pounds.  GI consulted, underwent an EGD that showed likely extrinsic narrowing and dilatation was attempted.  Follow-up esophagogram showed persistent narrowing. Discussions were held for PEG tube, however a stent has been offered only for palliation and if she is not getting further treatment.  After discussing with the gastroenterology and oncology teams, patient elected to have a stent placed rather than a PEG tube.  Stent was placed 1/14.  With stent  placement she will forego further cancer treatment.  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 Onesimo as an outpatient.  CT on admission showed new nodularities in the lung and hypodensities in the liver concerning for metastatic disease.  She got a palliative esophageal stent, no further treatment.    Chronic systolic CHF-euvolemic CAD-no chest pain Essential hypertension  Paroxysmal A-fib  History of VTE  History of non-Hodgkin's lymphoma-status post R-CHOP in 2024  Leukopenia, thrombocytopenia-cancer related  Sepsis ruled out   Discharge Instructions  Discharge Instructions     PHYSICIAN COMMUNICATION ORDER   Complete by: As directed    UA Protein, dipstick required prior to every 3rd cycle ramucirumab. TSH and T4 every 3rd cycle   Clinic Appointment Request   Complete by: Jul 30, 2024    Contact your oncology clinic or infusion center to schedule this appointment.   Infusion Appointment Request (240 min)   Complete by: Jul 30, 2024    Contact your oncology clinic or infusion center to schedule this appointment.   Lab Appointment Request   Complete by: Jul 30, 2024    Patient requires a Lab or Flush Appointment?: Flush Appointment   Lab appointment MUST be scheduled prior to chemotherapy appointment.   IS Injection Appt Request (15 minutes)   Complete by: Aug 01, 2024    Contact your oncology clinic or infusion center to schedule this appointment.   Clinic Appointment Request   Complete  by: Aug 20, 2024    Contact your oncology clinic or infusion center to schedule this appointment.   Infusion Appointment Request   Complete by: Aug 20, 2024    Contact your oncology clinic or infusion center to schedule this appointment.   Lab  Appointment Request   Complete by: Aug 20, 2024    Patient requires a Lab or Flush Appointment?: Flush Appointment   Lab appointment MUST be scheduled prior to chemotherapy appointment.   IS Injection Appt Request (15 minutes)   Complete by: Aug 22, 2024    Contact your oncology clinic or infusion center to schedule this appointment.      Allergies as of 07/26/2024       Reactions   Sumatriptan Succinate Other (See Comments)   Chest pain, no triptans, pt states it makes my heart race        Medication List     STOP taking these medications    apixaban  5 MG Tabs tablet Commonly known as: Eliquis    carvedilol  12.5 MG tablet Commonly known as: COREG    cyanocobalamin  1000 MCG tablet Commonly known as: VITAMIN B12   gabapentin  300 MG capsule Commonly known as: NEURONTIN    HYDROcodone -acetaminophen  5-325 MG tablet Commonly known as: NORCO/VICODIN   loratadine  10 MG tablet Commonly known as: CLARITIN    ramipril  2.5 MG tablet Commonly known as: ALTACE    simvastatin  40 MG tablet Commonly known as: ZOCOR    zonisamide  50 MG capsule Commonly known as: ZONEGRAN        TAKE these medications    cyclobenzaprine 5 MG tablet Commonly known as: FLEXERIL Take 5 mg by mouth 3 (three) times daily as needed.   ipratropium 17 MCG/ACT inhaler Commonly known as: ATROVENT  HFA Inhale 2 puffs into the lungs every 6 (six) hours as needed for wheezing.   lidocaine -prilocaine  cream Commonly known as: EMLA  Apply to affected area once   ondansetron  8 MG tablet Commonly known as: Zofran  Take 1 tablet (8 mg total) by mouth every 8 (eight) hours as needed for nausea or vomiting.   prochlorperazine  10 MG tablet Commonly known as: COMPAZINE  Take 1 tablet (10 mg total) by mouth every 6 (six) hours as needed for nausea or vomiting.   traZODone  50 MG tablet Commonly known as: DESYREL  Take 50 mg by mouth at bedtime.       Consultations: Oncology Gastroenterology Palliative  care Critical care  Procedures/Studies:  DG CHEST PORT 1 VIEW Result Date: 07/24/2024 CLINICAL DATA:  Acute respiratory failure. EXAM: PORTABLE CHEST 1 VIEW COMPARISON:  Chest radiograph dated 07/23/2024. FINDINGS: Similar positioning of the support apparatus. No significant interval change in bilateral streaky densities. No pneumothorax. Stable cardiac silhouette. Similar appearance of esophageal stent. No acute osseous pathology. IMPRESSION: No significant interval change. Electronically Signed   By: Vanetta Chou M.D.   On: 07/24/2024 13:07   DG Chest Port 1 View Result Date: 07/23/2024 CLINICAL DATA:  Intubation. EXAM: PORTABLE CHEST 1 VIEW COMPARISON:  March 25, 2024. FINDINGS: The heart size and mediastinal contours are within normal limits. Distal esophageal stent is noted. Endotracheal tube is in grossly good position. Left-sided defibrillator leads are again noted. Right internal jugular Port-A-Cath is unchanged. Increased left basilar opacity is noted concerning for atelectasis or infiltrate. Stable right upper lobe atelectasis or infiltrate is noted. The visualized skeletal structures are unremarkable. IMPRESSION: Endotracheal tube is in grossly good position. Increased left basilar opacity is noted concerning for atelectasis or infiltrate. Stable right upper lobe opacity is noted. Electronically Signed  By: Lynwood Landy Raddle M.D.   On: 07/23/2024 14:14   DG ESOPHAGUS DILATION Result Date: 07/23/2024 ESOPHAGEAL DILATATION: Fluoroscopy was provided for use by the requesting physician.  No images were obtained for radiographic interpretation.  CT ABDOMEN WO CONTRAST Result Date: 07/22/2024 CLINICAL DATA:  assess anatomy for gastrostomy tube placement Pre gastric tube placement. Barium swallow : Abrupt narrowing in mid esophagus likely secondary to left-sided paraesophageal mass based on CAT scan 2.4 x 2.3 cm(history of lymphoma and squamous cell carcinoma of right lung EXAM: CT ABDOMEN  WITHOUT CONTRAST TECHNIQUE: Multidetector CT imaging of the abdomen was performed following the standard protocol without IV contrast. RADIATION DOSE REDUCTION: This exam was performed according to the departmental dose-optimization program which includes automated exposure control, adjustment of the mA and/or kV according to patient size and/or use of iterative reconstruction technique. COMPARISON:  CTA chest and CT AP, 07/17/2024. FINDINGS: Lower chest: No acute abnormality. Anterior mediastinal mass and bibasilar pulmonary nodularities. Hepatobiliary: Multifocal hepatic hypodensities. No gallstones, gallbladder wall thickening, or biliary dilatation. Pancreas: No pancreatic ductal dilatation or surrounding inflammatory changes. Spleen: Normal in size without focal abnormality. Adrenals/Urinary Tract: Adrenal glands are unremarkable. Kidneys are normal, without renal calculi, focal lesion, or hydronephrosis. Stomach/Bowel: Intraluminal opacification of stomach and imaged small bowel, likely previously ingested. Stomach is within normal limits. Appendix appears normal. No evidence of bowel wall thickening, distention, or inflammatory changes. Vascular/Lymphatic: Aortic atherosclerosis. No enlarged abdominal lymph nodes. Other: Mild scaphoid abdomen. No abdominal wall hernia or abnormality. Musculoskeletal: RIGHT seventh rib expansile lesion. No interval osseous abnormality. IMPRESSION: 1. Mild prominence of the LEFT hepatic lobe. Anatomy is otherwise amenable for percutaneous gastrostomy. 2. No acute abdominal findings 3. Multiple findings within the imaged chest and abdomen, better appreciated and described on recent comparison contrasted CT CAP, consistent with metastatic disease. Electronically Signed   By: Thom Hall M.D.   On: 07/22/2024 07:03   DG ESOPHAGUS W SINGLE CM (SOL OR THIN BA) Result Date: 07/21/2024 CLINICAL DATA:  Dysphagia, history of esophageal stricture. EGD completed yesterday, unable to  advance endoscope past 30 cm distally in esophagus. GI suspecting extrinsic esophageal compression. Request for esophagram for further evaluation. EXAM: ESOPHAGUS/BARIUM SWALLOW/TABLET STUDY TECHNIQUE: Single contrast examination was performed using thin liquid barium. This exam was performed by Kimble Clas, PA-C, and was supervised and interpreted by Dr. MARLA Kilts. FLUOROSCOPY: Radiation Exposure Index (as provided by the fluoroscopic device): 13.70 mGy Kerma COMPARISON:  None Available. FINDINGS: Exam limited secondary to poor PO intake ability. Esophagus: Marked area of esophageal narrowing in the midthoracic esophagus with slowing of barium passage. Appears to be in similar location to area described in recent EGD, roughly 30 cm distal. Esophageal motility: Significant esophageal dysmotility with lying prone. Mild dysmotility with standing upright. Gastroesophageal reflux: None visualized. Other: None. IMPRESSION: Limited exam secondary to patient unable to drink consecutive swallows. Abrupt narrowing of the mid esophageal contrast column with partial obstruction. When correlated with the CT of 07/17/2024, this is likely secondary to a left-sided paraesophageal mass (most likely nodal) including on image 82 of series 6. This mass is estimated at 2.4 x 2.3 cm. Given EMR history of lymphoma and squamous cell carcinoma of the right lung, this presumably represents metastatic disease or lymphomatous involvement. Esophageal dysmotility, likely presbyesophagus. Electronically Signed   By: Rockey Kilts M.D.   On: 07/21/2024 11:03   CT ABDOMEN PELVIS W CONTRAST Result Date: 07/17/2024 EXAM: CTA CHEST PE WITH AND WITHOUT CONTRAST CT ABDOMEN AND PELVIS WITH  CONTRAST 07/17/2024 06:06:12 PM TECHNIQUE: CTA of the chest was performed after the administration of 100 mL of iohexol  (OMNIPAQUE ) 350 MG/ML injection. Multiplanar reformatted images are provided for review. MIP images are provided for review. CT of the abdomen and  pelvis was performed with the administration of intravenous contrast. Automated exposure control, iterative reconstruction, and/or weight based adjustment of the mA/kV was utilized to reduce the radiation dose to as low as reasonably achievable. COMPARISON: CT chest abdomen and pelvis 05/27/2024. CLINICAL HISTORY: Abdominal pain, nausea, vomiting, cancer, weight loss. Pulmonary embolus suspected with high probability. Feels like something in the chest. Difficulty swallowing. FINDINGS: CHEST: PULMONARY ARTERIES: Pulmonary arteries are adequately opacified for evaluation. No intraluminal filling defect to suggest pulmonary embolism. Main pulmonary artery is normal in caliber. MEDIASTINUM: Right central venous catheter with tip in the SVC. Normal heart size. No pericardial effusions. Air-fluid level and debris in the esophagus with esophageal dilatation down to the level of about T8 with distal decompression of the esophagus. This is concerning for esophageal obstruction, possibly due to mass, stricture, or extrinsic compression. Right supraclavicular lymphadenopathy, today measuring 2.6 cm in diameter. Right paratracheal lymphadenopathy measuring up to 1.2 cm short axis. Developing right hilar mass or lymphadenopathy measuring 2.4 cm diameter. There is no acute abnormality of the thoracic aorta. LUNGS AND PLEURA: Consolidation in the right middle lung likely representing postobstructive change. Pneumonia would be a less likely consideration. Multiple pulmonary nodules are demonstrated throughout the lungs. Largest are in the right lung bases measuring up to 1.5 x 0.9 cm. Pulmonary nodularity is new since the prior study. 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 No pleural effusion or pneumothorax. SOFT TISSUES AND BONES: Degenerative changes in the spine. Expansile soft tissue lesion involving the right anterior 7th rib,  enlarging since prior study, consistent with bone metastasis. ABDOMEN AND PELVIS: LIVER: Diffuse fatty infiltration of the liver. Multiple heterogeneous hypoenhancing liver lesions consistent with metastatic disease. The largest is in segment 5 measuring 3 cm diameter. This is progressing since the previous study. GALLBLADDER AND BILE DUCTS: Gallbladder is unremarkable. No biliary ductal dilatation. SPLEEN: Spleen demonstrates no acute abnormality. PANCREAS: Pancreas demonstrates no acute abnormality. ADRENAL GLANDS: Adrenal glands demonstrate no acute abnormality. KIDNEYS, URETERS AND BLADDER: No stones in the kidneys or ureters. No hydronephrosis. No perinephric or periureteral stranding. The bladder is obscured by streak artifact from hip arthroplasties. GI AND BOWEL: Stomach and duodenal sweep demonstrate no acute abnormality. There is no bowel obstruction. No abnormal bowel wall thickening or distension. Stool throughout the colon. The appendix is normal. REPRODUCTIVE: The uterus and ovaries are not enlarged. PERITONEUM AND RETROPERITONEUM: No ascites or free air. LYMPH NODES: No retroperitoneal lymphadenopathy. 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 Streak artifact from the arthroplasties limits visualization of the low pelvis. Degenerative changes in the lumbar spine with mild lumbar scoliosis convex towards the right. Calcification of the abdominal aorta. No focal soft tissue abnormality. IMPRESSION: 1. No pulmonary embolism. 2. Esophageal obstruction with air-fluid level and debris, possibly due to mass, stricture, or extrinsic compression. 3. Right supraclavicular, mediastinal, and right hilar lymphadenopathy. , concerning for malignancy. 4. Right hilar mass with right middle lobe consolidation, likely postobstructive change. 5. 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 6. Multiple heterogeneous  hypoenhancing liver lesions consistent with metastatic disease, progressing since the previous study. 7. Expansile soft tissue lesion involving the right  anterior 7th rib, enlarging since prior study, consistent with bone metastasis. Electronically signed by: Elsie Gravely MD 07/17/2024 06:24 PM EST RP Workstation: [id number]   CT Angio Chest PE W and/or Wo Contrast Result Date: 07/17/2024 EXAM: CTA CHEST PE WITH AND WITHOUT CONTRAST CT ABDOMEN AND PELVIS WITH CONTRAST 07/17/2024 06:06:12 PM TECHNIQUE: CTA of the chest was performed after the administration of 100 mL of iohexol  (OMNIPAQUE ) 350 MG/ML injection. Multiplanar reformatted images are provided for review. MIP images are provided for review. CT of the abdomen and pelvis was performed with the administration of intravenous contrast. Automated exposure control, iterative reconstruction, and/or weight based adjustment of the mA/kV was utilized to reduce the radiation dose to as low as reasonably achievable. COMPARISON: CT chest abdomen and pelvis 05/27/2024. CLINICAL HISTORY: Abdominal pain, nausea, vomiting, cancer, weight loss. Pulmonary embolus suspected with high probability. Feels like something in the chest. Difficulty swallowing. FINDINGS: CHEST: PULMONARY ARTERIES: Pulmonary arteries are adequately opacified for evaluation. No intraluminal filling defect to suggest pulmonary embolism. Main pulmonary artery is normal in caliber. MEDIASTINUM: Right central venous catheter with tip in the SVC. Normal heart size. No pericardial effusions. Air-fluid level and debris in the esophagus with esophageal dilatation down to the level of about T8 with distal decompression of the esophagus. This is concerning for esophageal obstruction, possibly due to mass, stricture, or extrinsic compression. Right supraclavicular lymphadenopathy, today measuring 2.6 cm in diameter. Right paratracheal lymphadenopathy measuring up to 1.2 cm short axis. Developing right hilar  mass or lymphadenopathy measuring 2.4 cm diameter. There is no acute abnormality of the thoracic aorta. LUNGS AND PLEURA: Consolidation in the right middle lung likely representing postobstructive change. Pneumonia would be a less likely consideration. Multiple pulmonary nodules are demonstrated throughout the lungs. Largest are in the right lung bases measuring up to 1.5 x 0.9 cm. Pulmonary nodularity is new since the prior study. 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 No pleural effusion or pneumothorax. SOFT TISSUES AND BONES: Degenerative changes in the spine. Expansile soft tissue lesion involving the right anterior 7th rib, enlarging since prior study, consistent with bone metastasis. ABDOMEN AND PELVIS: LIVER: Diffuse fatty infiltration of the liver. Multiple heterogeneous hypoenhancing liver lesions consistent with metastatic disease. The largest is in segment 5 measuring 3 cm diameter. This is progressing since the previous study. GALLBLADDER AND BILE DUCTS: Gallbladder is unremarkable. No biliary ductal dilatation. SPLEEN: Spleen demonstrates no acute abnormality. PANCREAS: Pancreas demonstrates no acute abnormality. ADRENAL GLANDS: Adrenal glands demonstrate no acute abnormality. KIDNEYS, URETERS AND BLADDER: No stones in the kidneys or ureters. No hydronephrosis. No perinephric or periureteral stranding. The bladder is obscured by streak artifact from hip arthroplasties. GI AND BOWEL: Stomach and duodenal sweep demonstrate no acute abnormality. There is no bowel obstruction. No abnormal bowel wall thickening or distension. Stool throughout the colon. The appendix is normal. REPRODUCTIVE: The uterus and ovaries are not enlarged. PERITONEUM AND RETROPERITONEUM: No ascites or free air. LYMPH NODES: No retroperitoneal lymphadenopathy. BONES AND SOFT TISSUES: Postoperative changes with bilateral total hip  arthroplasties. Streak artifact from the arthroplasties limits visualization of the low pelvis. Degenerative changes in the lumbar spine with mild lumbar scoliosis convex towards the right. Calcification of the abdominal aorta. No focal soft tissue abnormality. IMPRESSION: 1. No pulmonary embolism. 2. Esophageal obstruction with air-fluid level and debris, possibly due to mass, stricture, or extrinsic compression. 3. Right supraclavicular, mediastinal, and right hilar lymphadenopathy. , concerning for malignancy.  4. Right hilar mass with right middle lobe consolidation, likely postobstructive change. 5. 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 6. Multiple heterogeneous hypoenhancing liver lesions consistent with metastatic disease, progressing since the previous study. 7. Expansile soft tissue lesion involving the right anterior 7th rib, enlarging since prior study, consistent with bone metastasis. Electronically signed by: Elsie Gravely MD 07/17/2024 06:24 PM EST RP Workstation: [id number]     Subjective: - no chest pain, shortness of breath, no abdominal pain, nausea or vomiting.   Discharge Exam: BP (!) 141/42   Pulse (!) 101   Temp 99.1 F (37.3 C) (Oral)   Resp 20   Ht 5' 7 (1.702 m)   Wt 57.3 kg   SpO2 95%   BMI 19.78 kg/m   General: Pt is alert, awake, not in acute distress Cardiovascular: RRR, S1/S2 +, no rubs, no gallops Respiratory: CTA bilaterally, no wheezing, no rhonchi Abdominal: Soft, NT, ND, bowel sounds + Extremities: no edema, no cyanosis    The results of significant diagnostics from this hospitalization (including imaging, microbiology, ancillary and laboratory) are listed below for reference.     Microbiology: Recent Results (from the past 240 hours)  Resp panel by RT-PCR (RSV, Flu A&B, Covid) Anterior Nasal Swab     Status: None   Collection Time: 07/17/24  5:55 PM   Specimen: Anterior Nasal Swab   Result Value Ref Range Status   SARS Coronavirus 2 by RT PCR NEGATIVE NEGATIVE Final    Comment: (NOTE) SARS-CoV-2 target nucleic acids are NOT DETECTED.  The SARS-CoV-2 RNA is generally detectable in upper respiratory specimens during the acute phase of infection. The lowest concentration of SARS-CoV-2 viral copies this assay can detect is 138 copies/mL. A negative result does not preclude SARS-Cov-2 infection and should not be used as the sole basis for treatment or other patient management decisions. A negative result may occur with  improper specimen collection/handling, submission of specimen other than nasopharyngeal swab, presence of viral mutation(s) within the areas targeted by this assay, and inadequate number of viral copies(<138 copies/mL). A negative result must be combined with clinical observations, patient history, and epidemiological information. The expected result is Negative.  Fact Sheet for Patients:  bloggercourse.com  Fact Sheet for Healthcare Providers:  seriousbroker.it  This test is no t yet approved or cleared by the [place]  FDA and  has been authorized for detection and/or diagnosis of SARS-CoV-2 by FDA under an Emergency Use Authorization (EUA). This EUA will remain  in effect (meaning this test can be used) for the duration of the COVID-19 declaration under Section 564(b)(1) of the Act, 21 U.S.C.section 360bbb-3(b)(1), unless the authorization is terminated  or revoked sooner.       Influenza A by PCR NEGATIVE NEGATIVE Final   Influenza B by PCR NEGATIVE NEGATIVE Final    Comment: (NOTE) The Xpert Xpress SARS-CoV-2/FLU/RSV plus assay is intended as an aid in the diagnosis of influenza from Nasopharyngeal swab specimens and should not be used as a sole basis for treatment. Nasal washings and aspirates are unacceptable for Xpert Xpress SARS-CoV-2/FLU/RSV testing.  Fact Sheet for  Patients: bloggercourse.com  Fact Sheet for Healthcare Providers: seriousbroker.it  This test is not yet approved or cleared by the [place]  FDA and has been authorized for detection and/or diagnosis of SARS-CoV-2 by FDA under an Emergency Use Authorization (EUA). This EUA will remain in effect (meaning this test can be used) for the duration of the COVID-19 declaration  under Section 564(b)(1) of the Act, 21 U.S.C. section 360bbb-3(b)(1), unless the authorization is terminated or revoked.     Resp Syncytial Virus by PCR NEGATIVE NEGATIVE Final    Comment: (NOTE) Fact Sheet for Patients: bloggercourse.com  Fact Sheet for Healthcare Providers: seriousbroker.it  This test is not yet approved or cleared by the [place]  FDA and has been authorized for detection and/or diagnosis of SARS-CoV-2 by FDA under an Emergency Use Authorization (EUA). This EUA will remain in effect (meaning this test can be used) for the duration of the COVID-19 declaration under Section 564(b)(1) of the Act, 21 U.S.C. section 360bbb-3(b)(1), unless the authorization is terminated or revoked.  Performed at [facility], [address] [place], [place] [address]   Urine Culture     Status: Abnormal   Collection Time: 07/17/24  7:30 PM   Specimen: Urine, Random  Result Value Ref Range Status   Specimen Description   Final    URINE, RANDOM Performed at [facility], [address], [place], [place] [address]    Special Requests   Final    NONE Reflexed from [id number] Performed at [facility], [address] [place], [place] [address]    Culture (A)  Final    50,000 COLONIES/mL MULTIPLE SPECIES PRESENT, SUGGEST RECOLLECTION   Report Status 07/19/2024 FINAL  Final  Culture, Respiratory w Gram Stain     Status: None   Collection Time:  07/23/24 12:28 AM   Specimen: Tracheal Aspirate; Respiratory  Result Value Ref Range Status   Specimen Description   Final    TRACHEAL ASPIRATE Performed at [facility], [address] [place], [place] [address]    Special Requests   Final    NONE Performed at [facility], [address], [place], [place] [address]    Gram Stain   Final    FEW WBC PRESENT, PREDOMINANTLY PMN FEW GRAM POSITIVE COCCI RARE GRAM NEGATIVE RODS RARE YEAST    Culture   Final    MODERATE Consistent with normal respiratory flora. No Pseudomonas species isolated Performed at [facility], [address] [place], [place] [address]    Report Status 07/26/2024 FINAL  Final  MRSA Next Gen by PCR, Nasal     Status: None   Collection Time: 07/23/24  3:41 PM   Specimen: Nasal Mucosa; Nasal Swab  Result Value Ref Range Status   MRSA by PCR Next Gen NOT DETECTED NOT DETECTED Final    Comment: (NOTE) The GeneXpert MRSA Assay (FDA approved for NASAL specimens only), is one component of a comprehensive MRSA colonization surveillance program. It is not intended to diagnose MRSA infection nor to guide or monitor treatment for MRSA infections. Test performance is not FDA approved in patients less than 49 years old. Performed at [facility], [address], [place], [place] [address]      Labs: Basic Metabolic Panel: Recent Labs  Lab 07/20/24 0658 07/21/24 0445 07/23/24 0443 07/24/24 0549  NA 140 140 139 138  K 4.6 4.1 4.0 4.6  CL 104 105 106 104  CO2 29 28 25 23   GLUCOSE 94 104* 102* 143*  BUN <5* 5* <5* 6*  CREATININE 0.56 0.48 0.46 0.59  CALCIUM  9.9 9.6 9.4 10.0  MG  --   --   --  1.8  PHOS 3.3  --   --  5.3*   Liver Function Tests: Recent Labs  Lab 07/23/24  0443 07/24/24 0549  AST 18 19  ALT 10 10  ALKPHOS 87 96  BILITOT 0.9 1.4*  PROT 5.9* 6.2*  ALBUMIN  3.4* 3.5   CBC: Recent Labs  Lab 07/20/24 0658 07/21/24 0445  07/23/24 0443 07/24/24 0549  WBC 3.5* 3.4* 4.0 13.7*  NEUTROABS  --   --  3.1  --   HGB 13.0 12.8 13.5 15.4*  HCT 39.8 39.0 40.8 47.2*  MCV 93.4 94.7 94.2 93.8  PLT 111* 116* 118* 204   CBG: Recent Labs  Lab 07/24/24 1142 07/24/24 1536 07/24/24 2008 07/24/24 2358 07/25/24 0816  GLUCAP 100* 114* 100* 110* 110*   Hgb A1c No results for input(s): HGBA1C in the last 72 hours. Lipid Profile Recent Labs    07/24/24 0549  TRIG 109   Thyroid  function studies No results for input(s): TSH, T4TOTAL, T3FREE, THYROIDAB in the last 72 hours.  Invalid input(s): FREET3 Urinalysis    Component Value Date/Time   COLORURINE YELLOW 07/17/2024 1930   APPEARANCEUR CLEAR 07/17/2024 1930   LABSPEC 1.010 07/17/2024 1930   PHURINE 6.0 07/17/2024 1930   GLUCOSEU NEGATIVE 07/17/2024 1930   HGBUR NEGATIVE 07/17/2024 1930   BILIRUBINUR NEGATIVE 07/17/2024 1930   KETONESUR 5 (A) 07/17/2024 1930   PROTEINUR NEGATIVE 07/17/2024 1930   NITRITE NEGATIVE 07/17/2024 1930   LEUKOCYTESUR MODERATE (A) 07/17/2024 1930    FURTHER DISCHARGE INSTRUCTIONS:   Get Medicines reviewed and adjusted: Please take all your medications with you for your next visit with your Primary MD   Laboratory/radiological data: Please request your Primary MD to go over all hospital tests and procedure/radiological results at the follow up, please ask your Primary MD to get all Hospital records sent to his/her office.   In some cases, they will be blood work, cultures and biopsy results pending at the time of your discharge. Please request that your primary care M.D. goes through all the records of your hospital data and follows up on these results.   Also Note the following: If you experience worsening of your admission symptoms, develop shortness of breath, life threatening emergency, suicidal or homicidal thoughts you must seek medical attention immediately by calling 911 or calling your MD immediately  if  symptoms less severe.   You must read complete instructions/literature along with all the possible adverse reactions/side effects for all the Medicines you take and that have been prescribed to you. Take any new Medicines after you have completely understood and accpet all the possible adverse reactions/side effects.    Do not drive when taking Pain medications or sleeping medications (Benzodaizepines)   Do not take more than prescribed Pain, Sleep and Anxiety Medications. It is not advisable to combine anxiety,sleep and pain medications without talking with your primary care practitioner   Special Instructions: If you have smoked or chewed Tobacco  in the last 2 yrs please stop smoking, stop any regular Alcohol   and or any Recreational drug use.   Wear Seat belts while driving.   Please note: You were cared for by a hospitalist during your hospital stay. Once you are discharged, your primary care physician will handle any further medical issues. Please note that NO REFILLS for any discharge medications will be authorized once you are discharged, as it is imperative that you return to your primary care physician (or establish a relationship with a primary care physician if you do not have one) for your post hospital discharge needs so that they can reassess your need for medications and monitor  your lab values.  Time coordinating discharge: 35 minutes  SIGNED:  Nilda Fendt, MD, PhD 07/26/2024, 11:30 AM   "

## 2024-08-01 ENCOUNTER — Inpatient Hospital Stay

## 2024-08-01 ENCOUNTER — Inpatient Hospital Stay: Admitting: Hematology

## 2024-08-01 ENCOUNTER — Telehealth: Payer: Self-pay | Admitting: Emergency Medicine

## 2024-08-01 NOTE — Telephone Encounter (Signed)
 Spoke w/ patients daughter Elvie to reschedule patients 1/26 appointment with EP APP to later date d/t clinic closure because of weather. Elvie states patient is in hospice from cancer, she requested appointment be canceled at this time. Appointment is canceled.

## 2024-08-04 ENCOUNTER — Ambulatory Visit: Admitting: Student

## 2024-08-08 ENCOUNTER — Inpatient Hospital Stay

## 2024-08-08 ENCOUNTER — Inpatient Hospital Stay: Admitting: Hematology

## 2024-08-14 ENCOUNTER — Encounter: Payer: Self-pay | Admitting: Hematology

## 2024-08-15 ENCOUNTER — Inpatient Hospital Stay

## 2024-08-20 ENCOUNTER — Inpatient Hospital Stay

## 2024-08-20 ENCOUNTER — Inpatient Hospital Stay: Admitting: Hematology

## 2024-09-02 ENCOUNTER — Ambulatory Visit: Payer: Medicare Other

## 2024-09-03 ENCOUNTER — Inpatient Hospital Stay

## 2024-09-03 ENCOUNTER — Inpatient Hospital Stay: Admitting: Hematology

## 2024-09-17 ENCOUNTER — Inpatient Hospital Stay: Admitting: Physician Assistant

## 2024-09-17 ENCOUNTER — Inpatient Hospital Stay

## 2024-12-02 ENCOUNTER — Ambulatory Visit: Payer: Medicare Other

## 2025-03-03 ENCOUNTER — Ambulatory Visit: Payer: Medicare Other
